# Patient Record
Sex: Male | Born: 1965 | Race: White | Hispanic: No | Smoking: Current every day smoker
Health system: Southern US, Community
[De-identification: ages and names within clinical notes are randomized; demographics above are authoritative.]

## PROBLEM LIST (undated history)

## (undated) DIAGNOSIS — T782XXA Anaphylactic shock, unspecified, initial encounter: Secondary | ICD-10-CM

## (undated) DIAGNOSIS — Z8673 Personal history of transient ischemic attack (TIA), and cerebral infarction without residual deficits: Secondary | ICD-10-CM

## (undated) DIAGNOSIS — G4733 Obstructive sleep apnea (adult) (pediatric): Secondary | ICD-10-CM

## (undated) DIAGNOSIS — F32A Depression, unspecified: Secondary | ICD-10-CM

## (undated) DIAGNOSIS — E782 Mixed hyperlipidemia: Secondary | ICD-10-CM

## (undated) DIAGNOSIS — F329 Major depressive disorder, single episode, unspecified: Secondary | ICD-10-CM

## (undated) DIAGNOSIS — I2510 Atherosclerotic heart disease of native coronary artery without angina pectoris: Secondary | ICD-10-CM

## (undated) DIAGNOSIS — C801 Malignant (primary) neoplasm, unspecified: Secondary | ICD-10-CM

## (undated) DIAGNOSIS — I429 Cardiomyopathy, unspecified: Secondary | ICD-10-CM

## (undated) DIAGNOSIS — E119 Type 2 diabetes mellitus without complications: Secondary | ICD-10-CM

## (undated) DIAGNOSIS — M1990 Unspecified osteoarthritis, unspecified site: Secondary | ICD-10-CM

## (undated) DIAGNOSIS — J189 Pneumonia, unspecified organism: Secondary | ICD-10-CM

## (undated) DIAGNOSIS — I509 Heart failure, unspecified: Secondary | ICD-10-CM

## (undated) DIAGNOSIS — I219 Acute myocardial infarction, unspecified: Secondary | ICD-10-CM

## (undated) DIAGNOSIS — I10 Essential (primary) hypertension: Secondary | ICD-10-CM

## (undated) DIAGNOSIS — Z87442 Personal history of urinary calculi: Secondary | ICD-10-CM

## (undated) DIAGNOSIS — F419 Anxiety disorder, unspecified: Secondary | ICD-10-CM

## (undated) DIAGNOSIS — I639 Cerebral infarction, unspecified: Secondary | ICD-10-CM

## (undated) DIAGNOSIS — J449 Chronic obstructive pulmonary disease, unspecified: Secondary | ICD-10-CM

## (undated) HISTORY — DX: Atherosclerotic heart disease of native coronary artery without angina pectoris: I25.10

## (undated) HISTORY — DX: Depression, unspecified: F32.A

## (undated) HISTORY — DX: Type 2 diabetes mellitus without complications: E11.9

## (undated) HISTORY — DX: Mixed hyperlipidemia: E78.2

## (undated) HISTORY — DX: Obstructive sleep apnea (adult) (pediatric): G47.33

## (undated) HISTORY — DX: Essential (primary) hypertension: I10

## (undated) HISTORY — DX: Major depressive disorder, single episode, unspecified: F32.9

## (undated) HISTORY — PX: CHOLECYSTECTOMY: SHX55

## (undated) HISTORY — DX: Cardiomyopathy, unspecified: I42.9

## (undated) HISTORY — DX: Chronic obstructive pulmonary disease, unspecified: J44.9

## (undated) HISTORY — PX: HERNIA REPAIR: SHX51

## (undated) HISTORY — DX: Anaphylactic shock, unspecified, initial encounter: T78.2XXA

## (undated) HISTORY — PX: TONSILLECTOMY: SUR1361

## (undated) HISTORY — PX: TONSILECTOMY, ADENOIDECTOMY, BILATERAL MYRINGOTOMY AND TUBES: SHX2538

---

## 1996-07-15 HISTORY — PX: TOE AMPUTATION: SHX809

## 1998-07-26 ENCOUNTER — Encounter: Payer: Self-pay | Admitting: *Deleted

## 1998-07-26 ENCOUNTER — Inpatient Hospital Stay (HOSPITAL_COMMUNITY): Admission: AD | Admit: 1998-07-26 | Discharge: 1998-07-27 | Payer: Self-pay | Admitting: *Deleted

## 1998-07-27 ENCOUNTER — Encounter: Payer: Self-pay | Admitting: *Deleted

## 1999-09-24 ENCOUNTER — Inpatient Hospital Stay (HOSPITAL_COMMUNITY): Admission: EM | Admit: 1999-09-24 | Discharge: 1999-09-27 | Payer: Self-pay | Admitting: Emergency Medicine

## 1999-09-24 ENCOUNTER — Encounter: Payer: Self-pay | Admitting: Emergency Medicine

## 1999-09-25 ENCOUNTER — Encounter: Payer: Self-pay | Admitting: *Deleted

## 2000-04-24 ENCOUNTER — Encounter: Payer: Self-pay | Admitting: Emergency Medicine

## 2000-04-24 ENCOUNTER — Emergency Department (HOSPITAL_COMMUNITY): Admission: EM | Admit: 2000-04-24 | Discharge: 2000-04-24 | Payer: Self-pay | Admitting: Emergency Medicine

## 2000-10-21 ENCOUNTER — Encounter: Payer: Self-pay | Admitting: Emergency Medicine

## 2000-10-22 ENCOUNTER — Observation Stay (HOSPITAL_COMMUNITY): Admission: EM | Admit: 2000-10-22 | Discharge: 2000-10-22 | Payer: Self-pay

## 2001-05-30 ENCOUNTER — Encounter: Payer: Self-pay | Admitting: *Deleted

## 2001-05-30 ENCOUNTER — Emergency Department (HOSPITAL_COMMUNITY): Admission: EM | Admit: 2001-05-30 | Discharge: 2001-05-30 | Payer: Self-pay | Admitting: *Deleted

## 2001-06-19 ENCOUNTER — Encounter: Payer: Self-pay | Admitting: Emergency Medicine

## 2001-06-19 ENCOUNTER — Emergency Department (HOSPITAL_COMMUNITY): Admission: EM | Admit: 2001-06-19 | Discharge: 2001-06-19 | Payer: Self-pay | Admitting: Emergency Medicine

## 2001-07-15 HISTORY — PX: DENTAL SURGERY: SHX609

## 2001-11-11 ENCOUNTER — Emergency Department (HOSPITAL_COMMUNITY): Admission: EM | Admit: 2001-11-11 | Discharge: 2001-11-11 | Payer: Self-pay | Admitting: *Deleted

## 2001-11-11 ENCOUNTER — Encounter: Payer: Self-pay | Admitting: *Deleted

## 2001-11-13 ENCOUNTER — Ambulatory Visit (HOSPITAL_COMMUNITY): Admission: RE | Admit: 2001-11-13 | Discharge: 2001-11-14 | Payer: Self-pay | Admitting: Cardiology

## 2001-12-04 ENCOUNTER — Emergency Department (HOSPITAL_COMMUNITY): Admission: EM | Admit: 2001-12-04 | Discharge: 2001-12-04 | Payer: Self-pay | Admitting: Emergency Medicine

## 2001-12-30 ENCOUNTER — Encounter: Payer: Self-pay | Admitting: Emergency Medicine

## 2001-12-30 ENCOUNTER — Observation Stay (HOSPITAL_COMMUNITY): Admission: EM | Admit: 2001-12-30 | Discharge: 2001-12-31 | Payer: Self-pay | Admitting: Emergency Medicine

## 2002-02-01 ENCOUNTER — Encounter: Payer: Self-pay | Admitting: *Deleted

## 2002-02-01 ENCOUNTER — Emergency Department (HOSPITAL_COMMUNITY): Admission: EM | Admit: 2002-02-01 | Discharge: 2002-02-02 | Payer: Self-pay | Admitting: *Deleted

## 2002-03-01 ENCOUNTER — Emergency Department (HOSPITAL_COMMUNITY): Admission: EM | Admit: 2002-03-01 | Discharge: 2002-03-02 | Payer: Self-pay | Admitting: *Deleted

## 2002-03-02 ENCOUNTER — Encounter: Payer: Self-pay | Admitting: *Deleted

## 2002-04-22 ENCOUNTER — Inpatient Hospital Stay (HOSPITAL_COMMUNITY): Admission: EM | Admit: 2002-04-22 | Discharge: 2002-04-24 | Payer: Self-pay | Admitting: Cardiology

## 2002-06-30 ENCOUNTER — Encounter: Payer: Self-pay | Admitting: Dentistry

## 2002-07-06 ENCOUNTER — Ambulatory Visit (HOSPITAL_COMMUNITY): Admission: RE | Admit: 2002-07-06 | Discharge: 2002-07-06 | Payer: Self-pay | Admitting: Oral Surgery

## 2002-10-10 ENCOUNTER — Inpatient Hospital Stay (HOSPITAL_COMMUNITY): Admission: AD | Admit: 2002-10-10 | Discharge: 2002-10-12 | Payer: Self-pay | Admitting: Cardiology

## 2002-12-07 ENCOUNTER — Emergency Department (HOSPITAL_COMMUNITY): Admission: EM | Admit: 2002-12-07 | Discharge: 2002-12-07 | Payer: Self-pay | Admitting: Emergency Medicine

## 2004-02-10 ENCOUNTER — Observation Stay (HOSPITAL_COMMUNITY): Admission: EM | Admit: 2004-02-10 | Discharge: 2004-02-10 | Payer: Self-pay | Admitting: Cardiovascular Disease

## 2004-04-08 ENCOUNTER — Emergency Department (HOSPITAL_COMMUNITY): Admission: EM | Admit: 2004-04-08 | Discharge: 2004-04-09 | Payer: Self-pay | Admitting: Emergency Medicine

## 2004-08-01 ENCOUNTER — Ambulatory Visit: Payer: Self-pay | Admitting: Cardiology

## 2005-02-08 ENCOUNTER — Ambulatory Visit: Payer: Self-pay | Admitting: Cardiology

## 2005-08-14 ENCOUNTER — Ambulatory Visit: Payer: Self-pay | Admitting: Cardiology

## 2005-08-19 ENCOUNTER — Inpatient Hospital Stay (HOSPITAL_BASED_OUTPATIENT_CLINIC_OR_DEPARTMENT_OTHER): Admission: RE | Admit: 2005-08-19 | Discharge: 2005-08-19 | Payer: Self-pay | Admitting: Cardiology

## 2005-08-19 ENCOUNTER — Ambulatory Visit: Payer: Self-pay | Admitting: Cardiology

## 2006-09-05 ENCOUNTER — Encounter: Payer: Self-pay | Admitting: Cardiology

## 2006-09-05 ENCOUNTER — Ambulatory Visit: Payer: Self-pay | Admitting: Cardiovascular Disease

## 2006-09-05 ENCOUNTER — Inpatient Hospital Stay (HOSPITAL_COMMUNITY): Admission: AD | Admit: 2006-09-05 | Discharge: 2006-09-07 | Payer: Self-pay | Admitting: Cardiovascular Disease

## 2006-09-05 ENCOUNTER — Encounter: Payer: Self-pay | Admitting: Internal Medicine

## 2006-09-19 ENCOUNTER — Inpatient Hospital Stay (HOSPITAL_COMMUNITY): Admission: EM | Admit: 2006-09-19 | Discharge: 2006-09-22 | Payer: Self-pay | Admitting: Cardiology

## 2006-09-19 ENCOUNTER — Ambulatory Visit: Payer: Self-pay | Admitting: *Deleted

## 2006-10-02 ENCOUNTER — Ambulatory Visit: Payer: Self-pay | Admitting: Cardiology

## 2006-10-15 ENCOUNTER — Ambulatory Visit: Payer: Self-pay | Admitting: Cardiology

## 2006-11-19 ENCOUNTER — Ambulatory Visit: Payer: Self-pay | Admitting: Cardiology

## 2006-11-24 ENCOUNTER — Ambulatory Visit: Payer: Self-pay | Admitting: Cardiovascular Disease

## 2006-11-24 ENCOUNTER — Ambulatory Visit (HOSPITAL_COMMUNITY): Admission: RE | Admit: 2006-11-24 | Discharge: 2006-11-24 | Payer: Self-pay | Admitting: Cardiovascular Disease

## 2007-02-11 ENCOUNTER — Ambulatory Visit (HOSPITAL_COMMUNITY): Admission: RE | Admit: 2007-02-11 | Discharge: 2007-02-11 | Payer: Self-pay | Admitting: Urology

## 2007-02-18 ENCOUNTER — Ambulatory Visit: Payer: Self-pay | Admitting: Physician Assistant

## 2007-02-19 ENCOUNTER — Ambulatory Visit (HOSPITAL_COMMUNITY): Admission: RE | Admit: 2007-02-19 | Discharge: 2007-02-19 | Payer: Self-pay | Admitting: Urology

## 2007-03-30 ENCOUNTER — Ambulatory Visit (HOSPITAL_COMMUNITY): Admission: RE | Admit: 2007-03-30 | Discharge: 2007-03-30 | Payer: Self-pay | Admitting: Orthopedic Surgery

## 2007-04-21 ENCOUNTER — Ambulatory Visit: Payer: Self-pay | Admitting: Cardiology

## 2007-06-24 ENCOUNTER — Ambulatory Visit: Payer: Self-pay | Admitting: Cardiology

## 2007-11-10 ENCOUNTER — Encounter: Payer: Self-pay | Admitting: Cardiology

## 2007-11-10 ENCOUNTER — Ambulatory Visit: Payer: Self-pay | Admitting: Cardiology

## 2007-11-11 ENCOUNTER — Encounter: Payer: Self-pay | Admitting: Cardiology

## 2007-12-03 ENCOUNTER — Ambulatory Visit: Payer: Self-pay | Admitting: Cardiology

## 2008-03-08 ENCOUNTER — Ambulatory Visit: Payer: Self-pay | Admitting: Cardiology

## 2008-05-27 ENCOUNTER — Encounter: Payer: Self-pay | Admitting: Cardiology

## 2008-06-20 ENCOUNTER — Ambulatory Visit: Payer: Self-pay | Admitting: Cardiology

## 2008-06-24 ENCOUNTER — Ambulatory Visit: Payer: Self-pay | Admitting: Cardiovascular Disease

## 2008-07-15 DIAGNOSIS — I219 Acute myocardial infarction, unspecified: Secondary | ICD-10-CM

## 2008-07-15 HISTORY — PX: GASTRIC BYPASS: SHX52

## 2008-07-15 HISTORY — PX: CORONARY ARTERY BYPASS GRAFT: SHX141

## 2008-07-15 HISTORY — DX: Acute myocardial infarction, unspecified: I21.9

## 2008-07-20 ENCOUNTER — Encounter: Payer: Self-pay | Admitting: Cardiology

## 2008-07-21 ENCOUNTER — Encounter: Payer: Self-pay | Admitting: Cardiovascular Disease

## 2008-07-21 ENCOUNTER — Ambulatory Visit: Payer: Self-pay | Admitting: Vascular Surgery

## 2008-07-21 ENCOUNTER — Inpatient Hospital Stay (HOSPITAL_COMMUNITY): Admission: RE | Admit: 2008-07-21 | Discharge: 2008-07-21 | Payer: Self-pay | Admitting: Cardiovascular Disease

## 2008-07-21 ENCOUNTER — Ambulatory Visit: Payer: Self-pay | Admitting: Cardiovascular Disease

## 2008-07-26 ENCOUNTER — Ambulatory Visit: Payer: Self-pay | Admitting: Surgery

## 2008-07-29 ENCOUNTER — Ambulatory Visit: Payer: Self-pay | Admitting: Surgery

## 2008-07-29 ENCOUNTER — Inpatient Hospital Stay (HOSPITAL_COMMUNITY): Admission: RE | Admit: 2008-07-29 | Discharge: 2008-08-04 | Payer: Self-pay | Admitting: Surgery

## 2008-08-02 ENCOUNTER — Encounter: Payer: Self-pay | Admitting: Cardiology

## 2008-08-04 ENCOUNTER — Encounter: Payer: Self-pay | Admitting: Cardiology

## 2008-08-16 DIAGNOSIS — E782 Mixed hyperlipidemia: Secondary | ICD-10-CM | POA: Insufficient documentation

## 2008-08-16 DIAGNOSIS — I2510 Atherosclerotic heart disease of native coronary artery without angina pectoris: Secondary | ICD-10-CM | POA: Insufficient documentation

## 2008-08-16 DIAGNOSIS — E785 Hyperlipidemia, unspecified: Secondary | ICD-10-CM | POA: Insufficient documentation

## 2008-08-16 DIAGNOSIS — I10 Essential (primary) hypertension: Secondary | ICD-10-CM | POA: Insufficient documentation

## 2008-08-17 ENCOUNTER — Ambulatory Visit: Payer: Self-pay | Admitting: Cardiology

## 2008-08-29 ENCOUNTER — Encounter: Payer: Self-pay | Admitting: Cardiology

## 2008-08-29 ENCOUNTER — Ambulatory Visit: Payer: Self-pay | Admitting: Surgery

## 2008-08-29 ENCOUNTER — Encounter: Admission: RE | Admit: 2008-08-29 | Discharge: 2008-08-29 | Payer: Self-pay | Admitting: Surgery

## 2008-09-22 ENCOUNTER — Ambulatory Visit: Payer: Self-pay | Admitting: Cardiology

## 2009-03-24 ENCOUNTER — Encounter: Payer: Self-pay | Admitting: Cardiology

## 2009-08-03 ENCOUNTER — Encounter: Payer: Self-pay | Admitting: Cardiology

## 2009-08-18 ENCOUNTER — Encounter: Payer: Self-pay | Admitting: Cardiology

## 2009-08-21 ENCOUNTER — Telehealth (INDEPENDENT_AMBULATORY_CARE_PROVIDER_SITE_OTHER): Payer: Self-pay | Admitting: *Deleted

## 2009-08-23 ENCOUNTER — Encounter: Payer: Self-pay | Admitting: Physician Assistant

## 2009-08-23 ENCOUNTER — Ambulatory Visit: Payer: Self-pay | Admitting: Cardiology

## 2009-08-23 DIAGNOSIS — F17200 Nicotine dependence, unspecified, uncomplicated: Secondary | ICD-10-CM | POA: Insufficient documentation

## 2009-09-15 ENCOUNTER — Emergency Department (HOSPITAL_COMMUNITY): Admission: EM | Admit: 2009-09-15 | Discharge: 2009-09-15 | Payer: Self-pay | Admitting: Emergency Medicine

## 2009-10-11 ENCOUNTER — Encounter: Payer: Self-pay | Admitting: Cardiology

## 2009-10-11 ENCOUNTER — Emergency Department (HOSPITAL_COMMUNITY): Admission: EM | Admit: 2009-10-11 | Discharge: 2009-10-11 | Payer: Self-pay | Admitting: Emergency Medicine

## 2009-11-12 IMAGING — CR DG CHEST 1V PORT
1 series · 1 of 1 positions shown · non-contrast
Comparison: Chest radiograph 08/01/2008

CLINICAL DATA: Coronary artery disease

PORTABLE CHEST - 1 VIEW

[view not recorded]
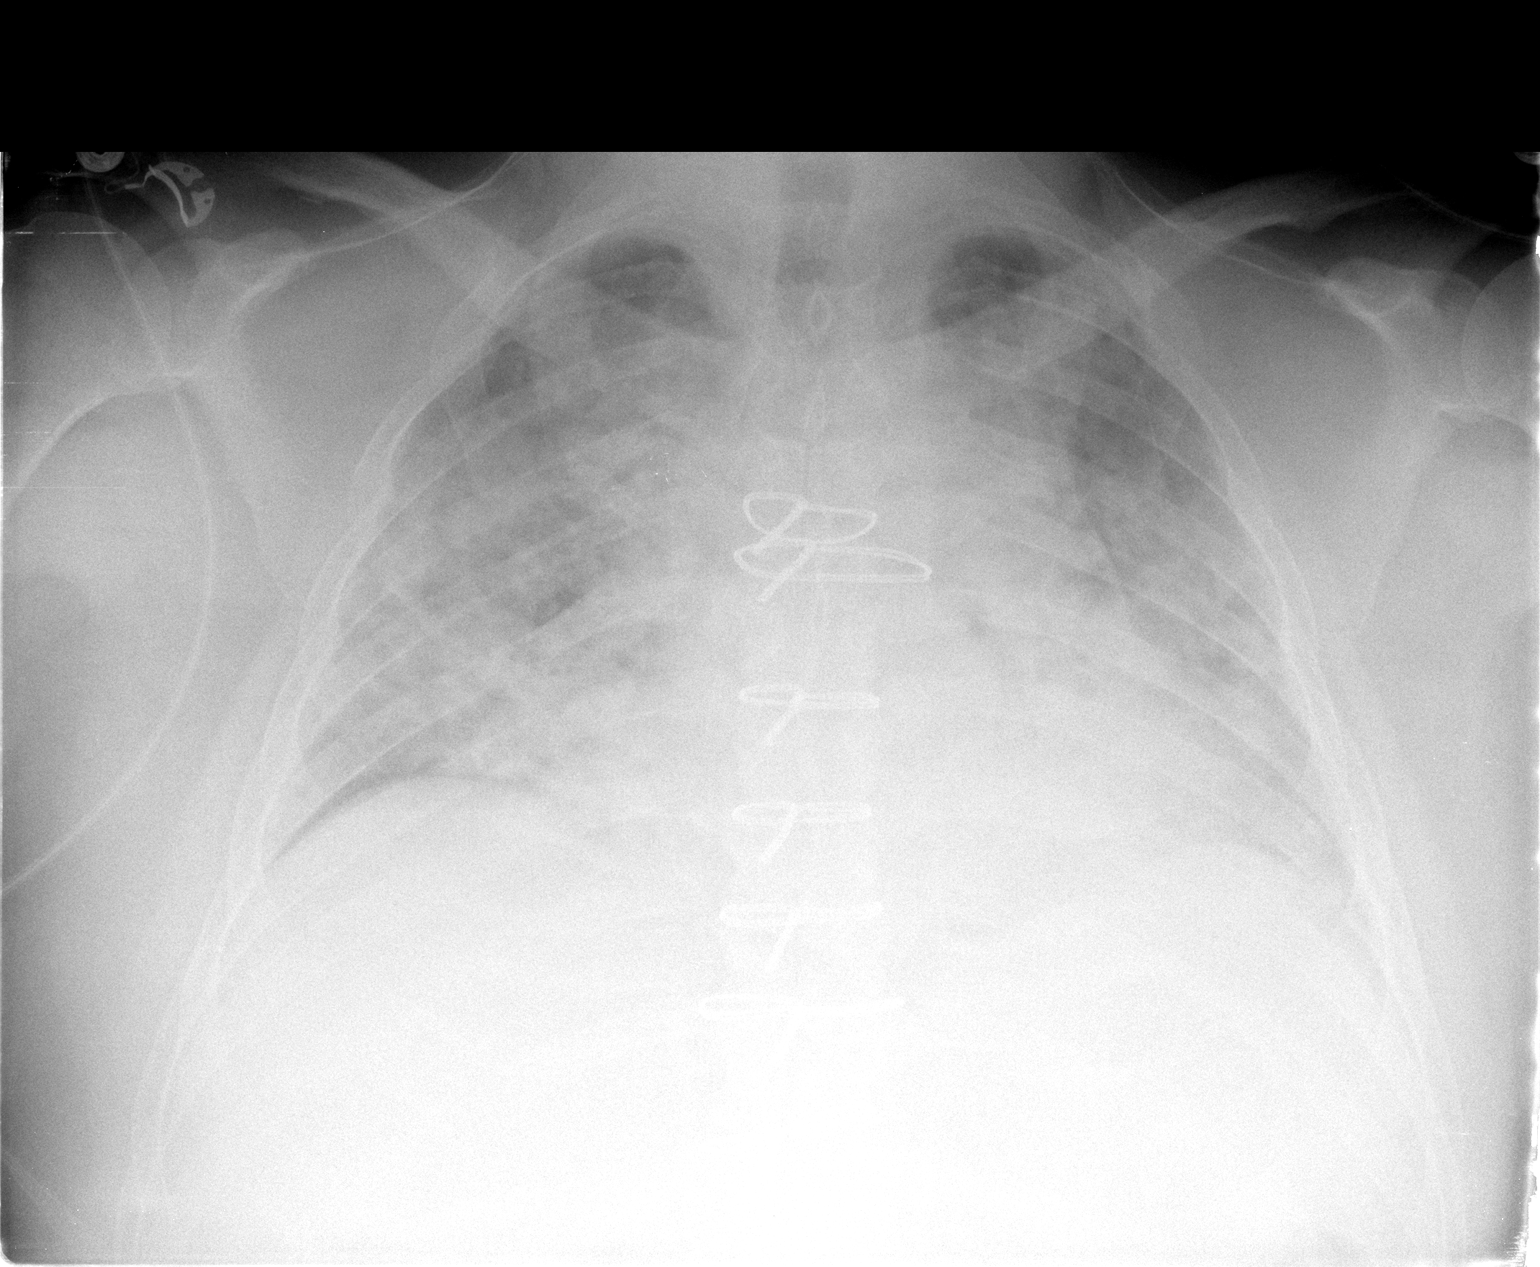

[1 of 1 positions shown; findings below may reference images not displayed]

FINDINGS: Stable enlarged cardiac silhouette.  There are low lung
volumes.  There is bilateral air space disease which is increased
from prior.  No pneumothorax.
IMPRESSION: Cardiomegaly and increased pulmonary edema consistent congestive
heart failure.

## 2009-11-13 IMAGING — CR DG CHEST 1V PORT
1 series · 1 of 1 positions shown · non-contrast
Comparison: 08/01/2008 study

CLINICAL DATA: History of coronary artery disease.

PORTABLE CHEST - 1 VIEW

[view not recorded]
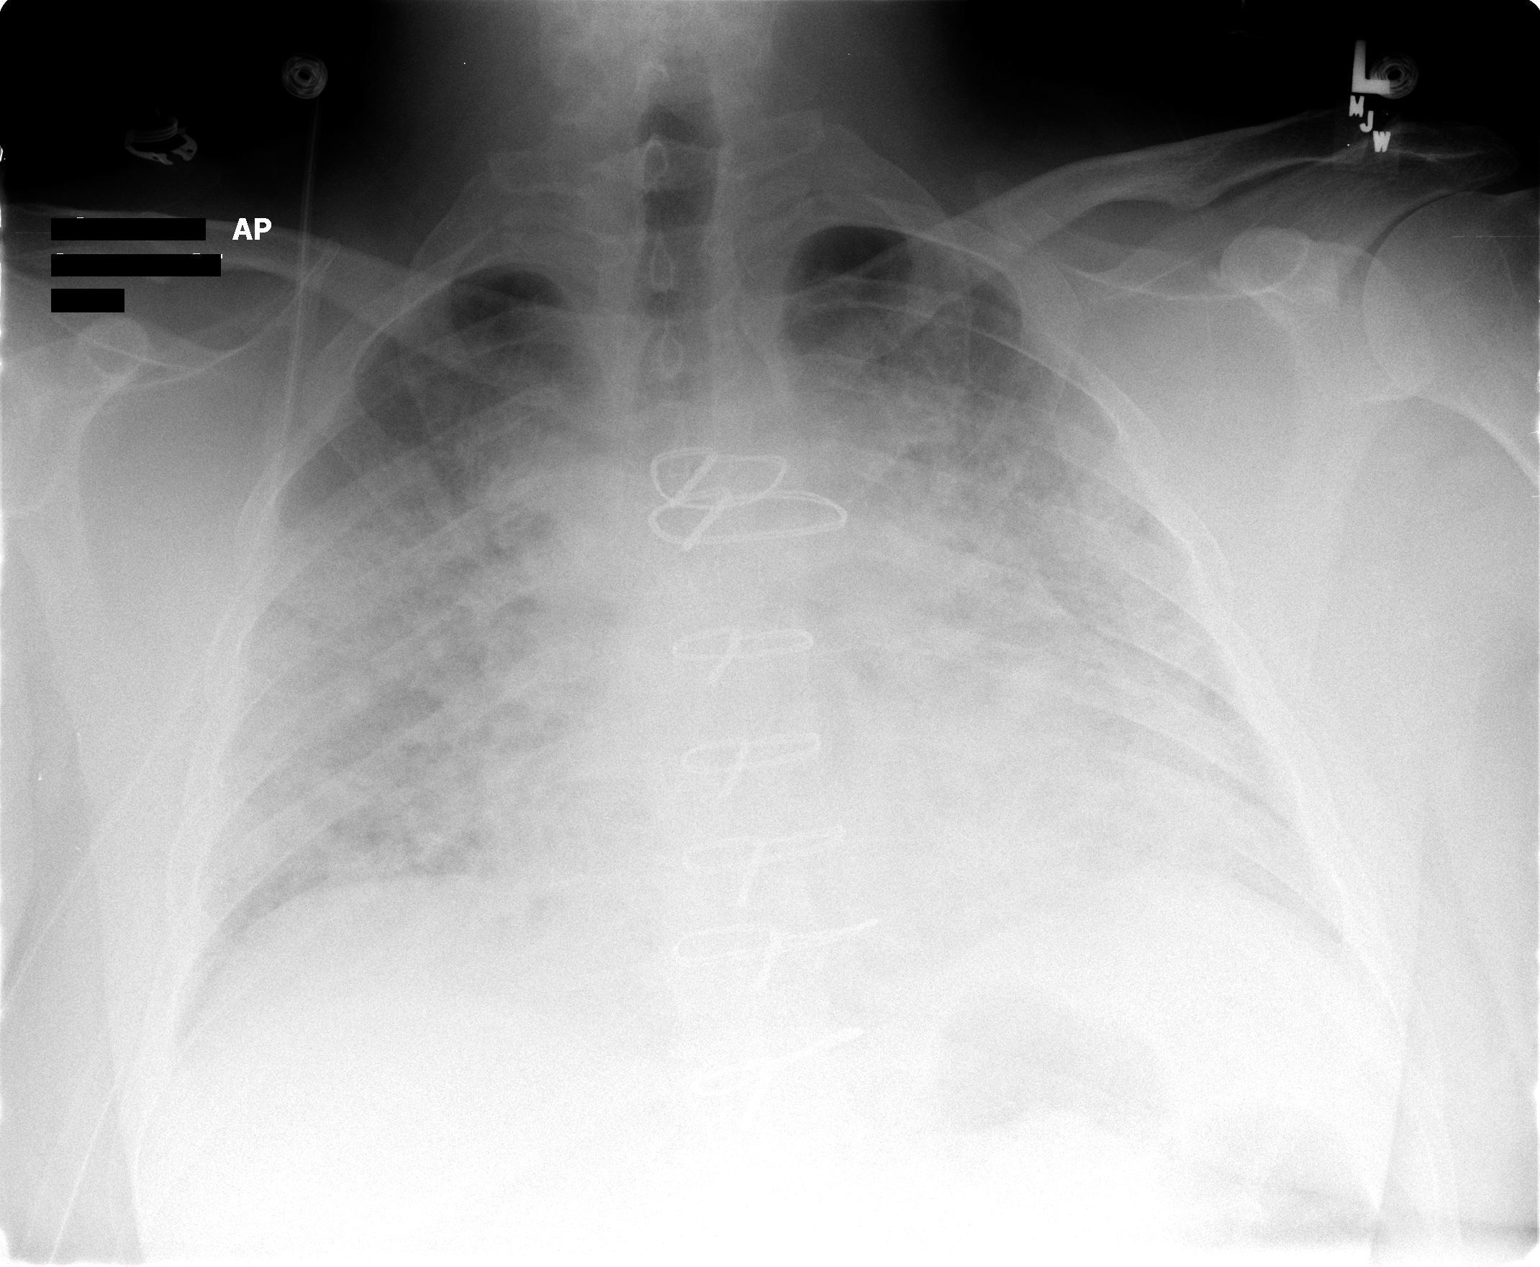

[1 of 1 positions shown; findings below may reference images not displayed]

FINDINGS: There is stable enlargement of the cardiac silhouette.
There is a pulmonary venous hypertension pulmonary vascular
congestion pattern with diffuse bilateral airspace opacities
present without significant change from previous study.  No
definite pleural effusion is identified.  No pneumothorax is
evident.
IMPRESSION: Enlargement cardiac silhouette appears stable There is a pulmonary
venous hypertension pulmonary vascular congestion pattern with
diffuse bilateral airspace opacities consist with pulmonary edema
present without significant change from previous study.

## 2009-11-14 IMAGING — CR DG CHEST 1V PORT
1 series · 1 of 1 positions shown · non-contrast
Comparison: 08/02/2008

CLINICAL DATA: Coronary artery bypass graft

PORTABLE CHEST - 1 VIEW

[view not recorded]
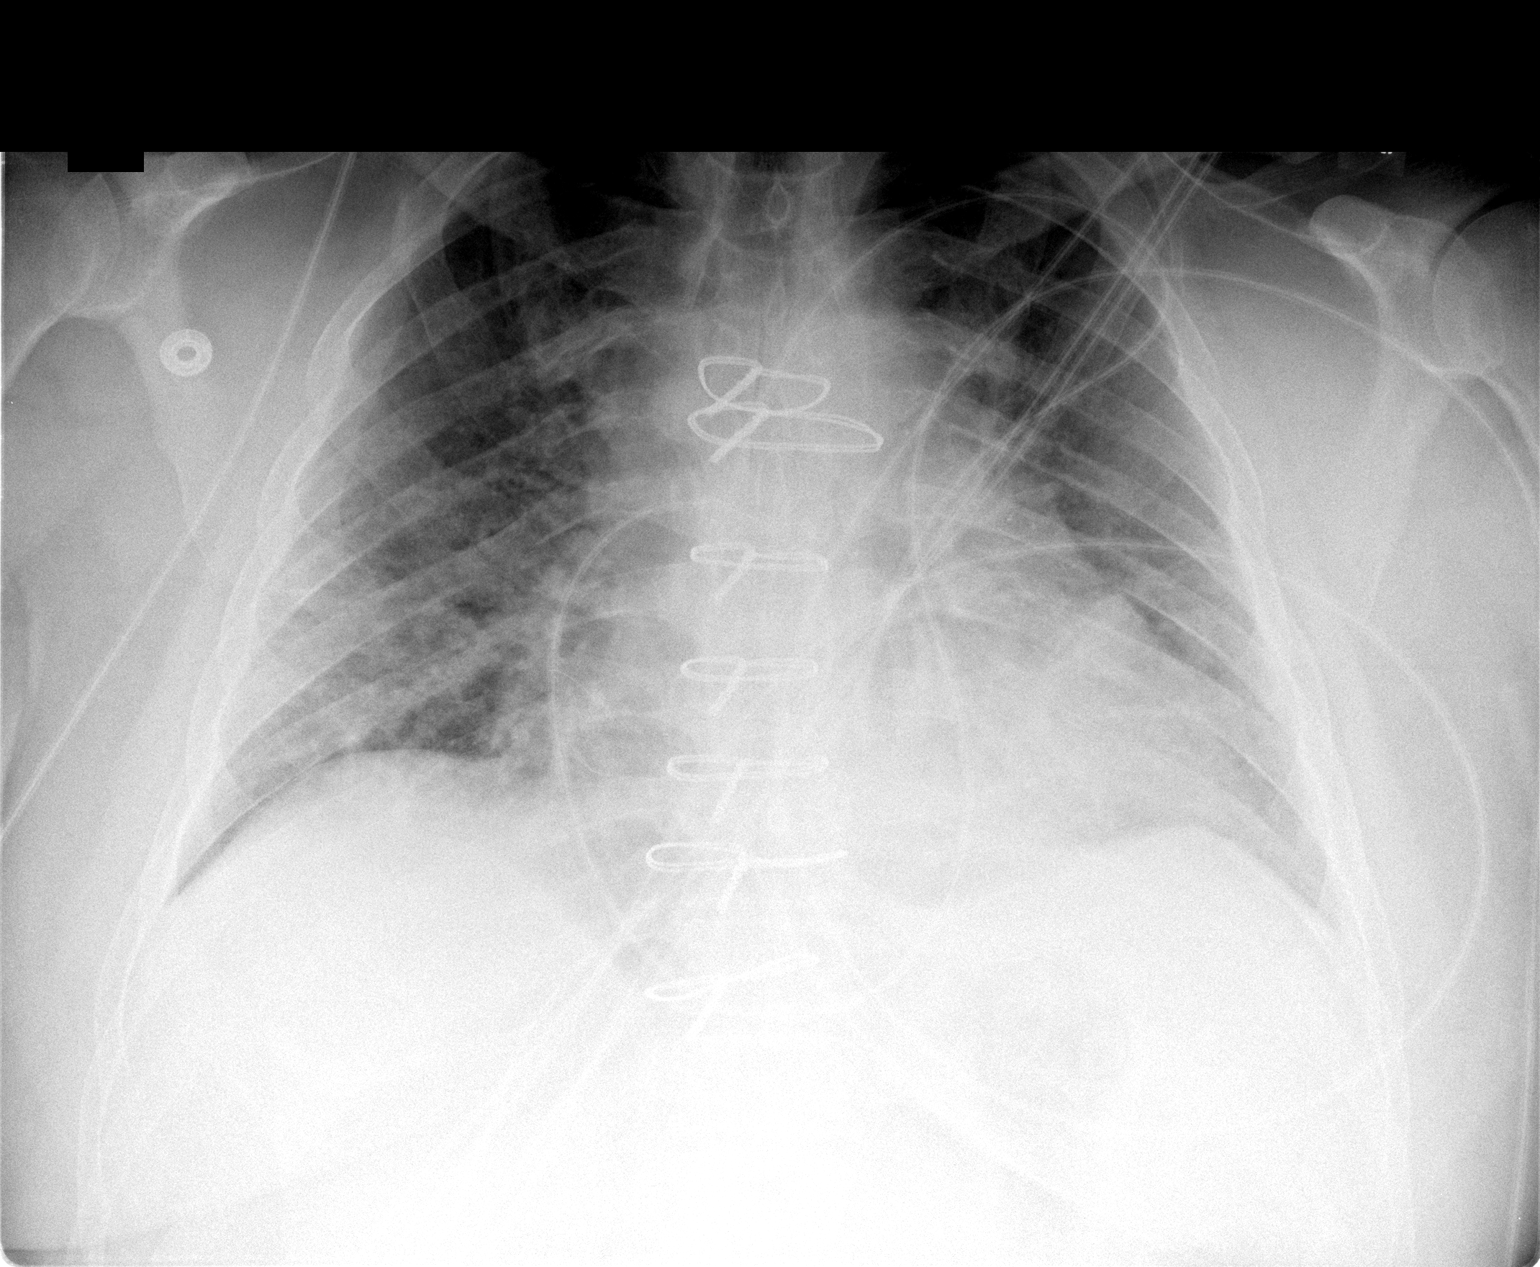

[1 of 1 positions shown; findings below may reference images not displayed]

FINDINGS: Diffuse edema has improved.  Cardiomegaly persist.
Prominent superior mediastinum is improved.  No pneumothorax.
IMPRESSION: Improved edema and mediastinum.

## 2009-11-15 IMAGING — CR DG CHEST 2V
2 series · 2 of 2 positions shown · non-contrast
Comparison: 08/03/2008

CLINICAL DATA: Bypass surgery.

CHEST - 2 VIEW

[w chest pa]
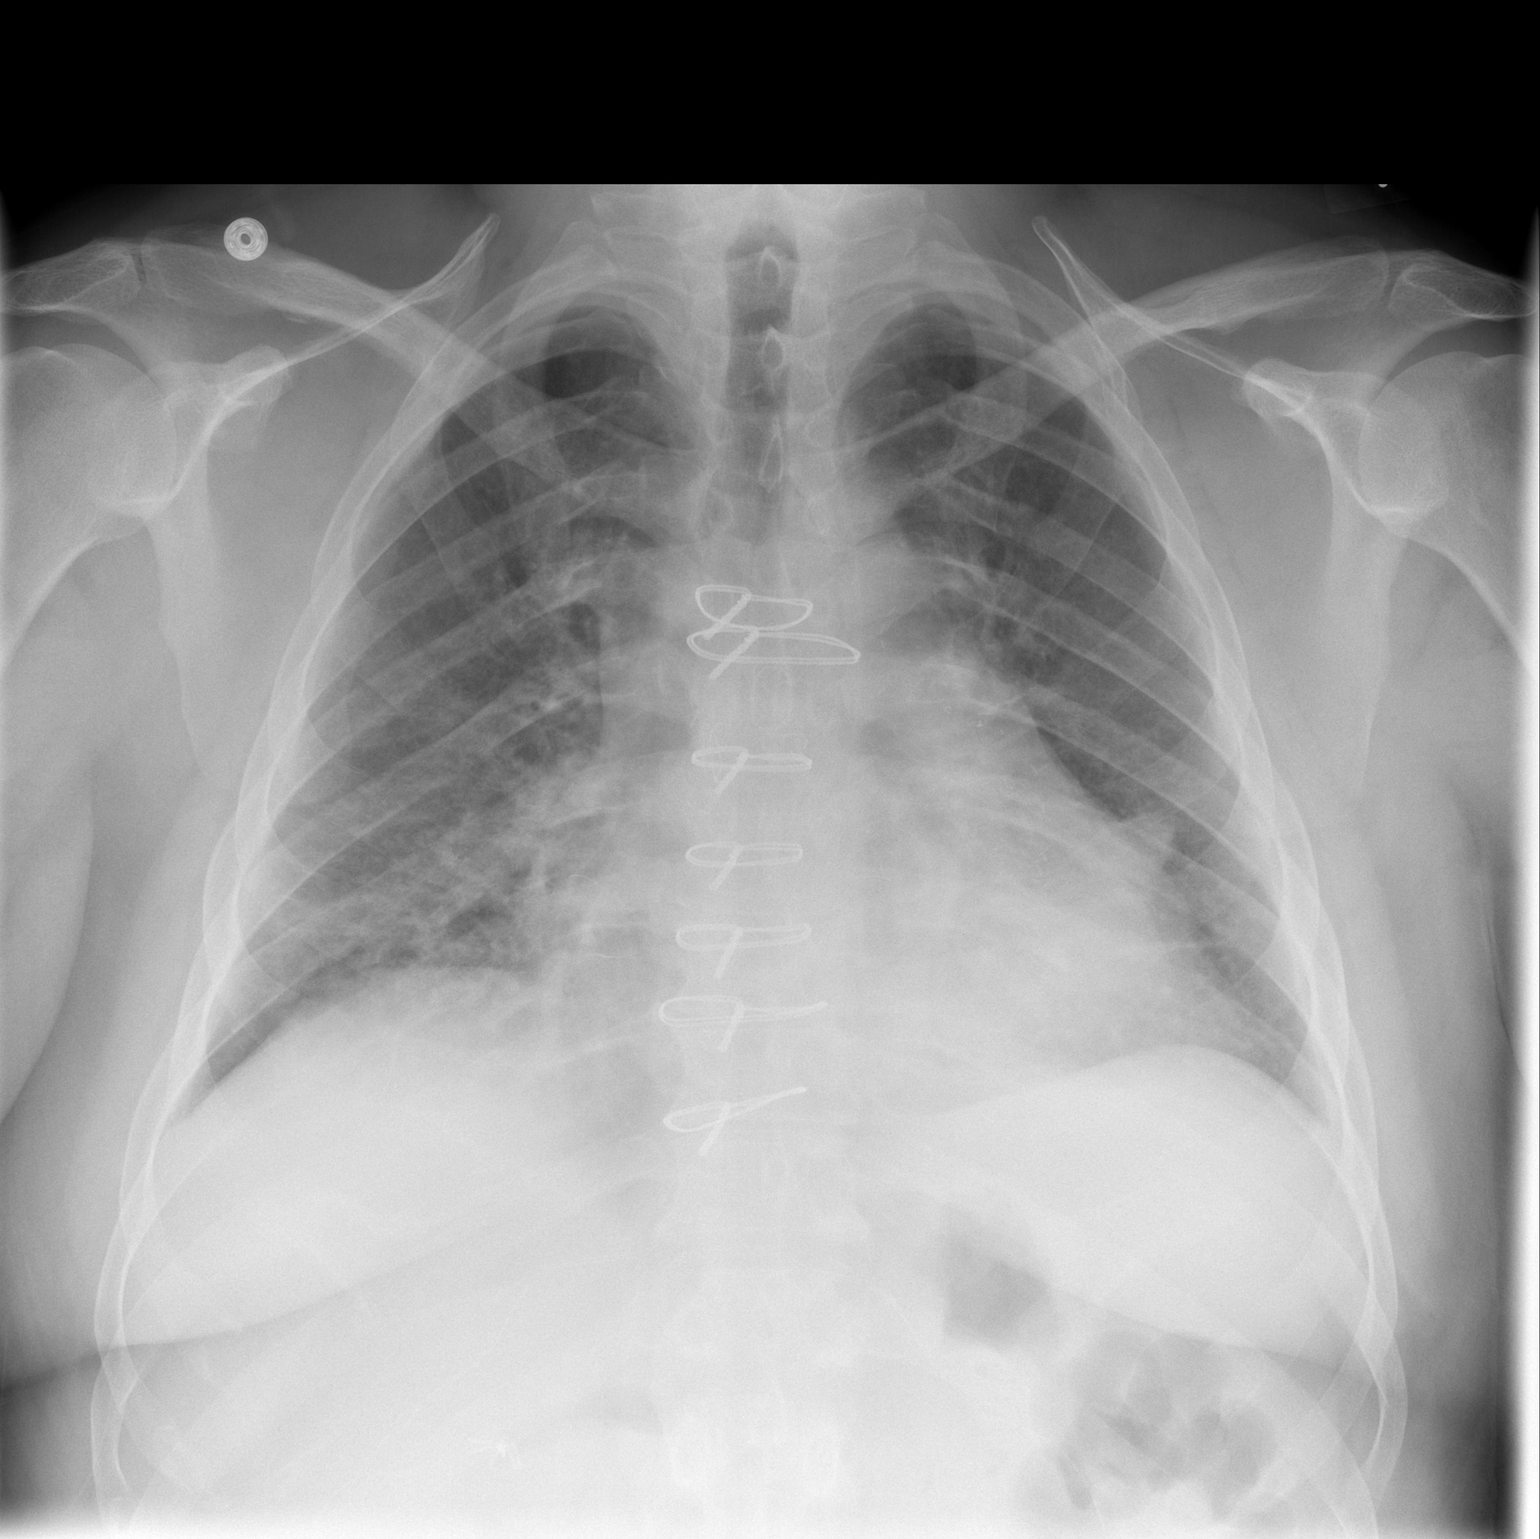

[w chest lat]
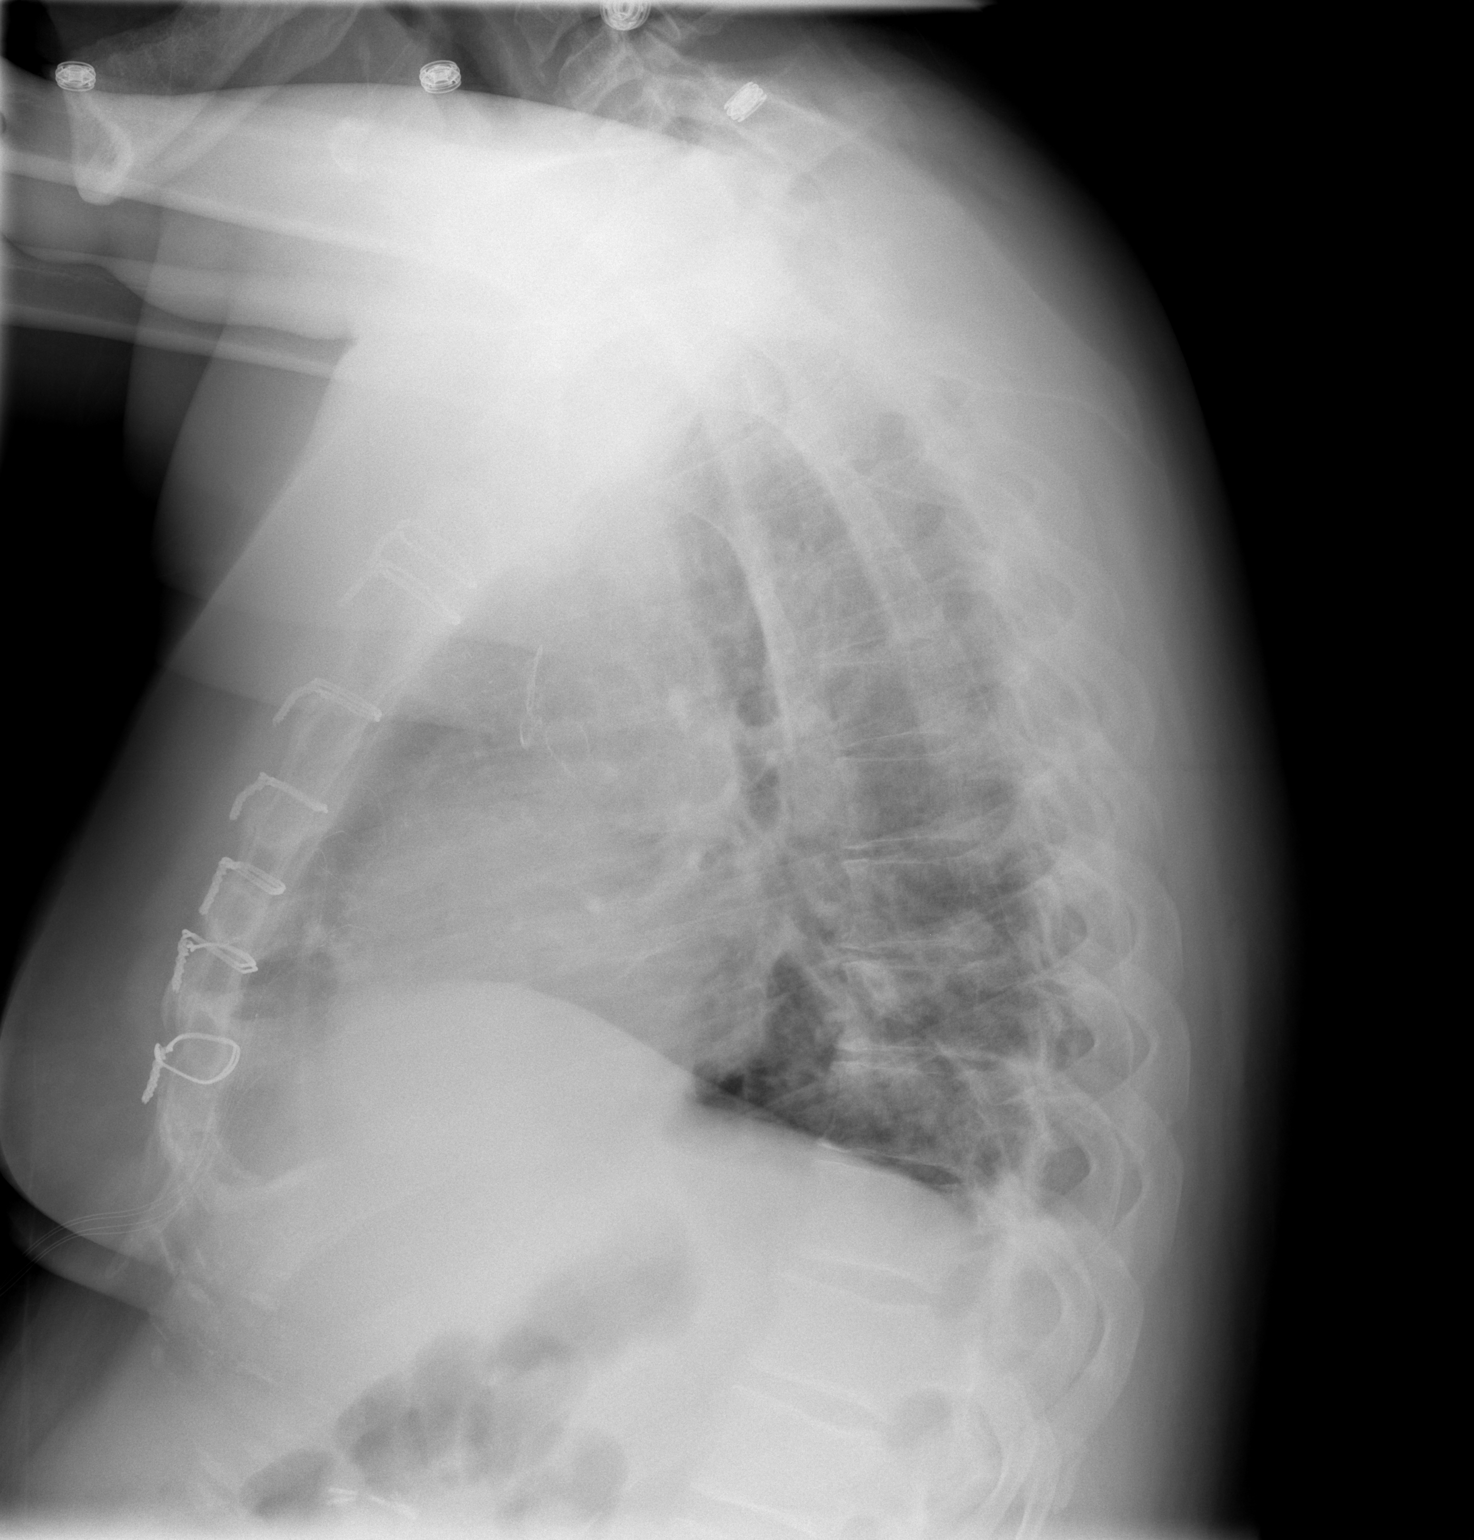

[2 of 2 positions shown; findings below may reference images not displayed]

FINDINGS: Stable surgical changes.  Interval improved lung aeration
with resolving edema and atelectasis.  No pleural effusions.  No
pneumothorax.
IMPRESSION: Improved lung aeration with resolving edema and atelectasis.

## 2010-01-30 ENCOUNTER — Encounter: Payer: Self-pay | Admitting: Cardiology

## 2010-02-13 ENCOUNTER — Encounter: Payer: Self-pay | Admitting: Cardiology

## 2010-06-22 ENCOUNTER — Encounter: Payer: Self-pay | Admitting: Cardiology

## 2010-07-06 ENCOUNTER — Encounter
Admission: RE | Admit: 2010-07-06 | Discharge: 2010-07-06 | Payer: Self-pay | Source: Home / Self Care | Attending: Diagnostic Neuroimaging | Admitting: Diagnostic Neuroimaging

## 2010-07-13 ENCOUNTER — Encounter: Payer: Self-pay | Admitting: Cardiology

## 2010-07-18 ENCOUNTER — Encounter: Payer: Self-pay | Admitting: Cardiology

## 2010-07-20 ENCOUNTER — Encounter: Payer: Self-pay | Admitting: Cardiology

## 2010-08-01 ENCOUNTER — Encounter: Payer: Self-pay | Admitting: Cardiology

## 2010-08-05 ENCOUNTER — Encounter: Payer: Self-pay | Admitting: Urology

## 2010-08-10 ENCOUNTER — Encounter: Payer: Self-pay | Admitting: Cardiology

## 2010-08-13 ENCOUNTER — Encounter: Payer: Self-pay | Admitting: Cardiology

## 2010-08-14 NOTE — Letter (Signed)
Summary: External Correspondence/ OFFICE VISIT DR. DANIEL  External Correspondence/ OFFICE VISIT DR. DANIEL   Imported By: Bartholomew Boards 02/20/2010 15:24:57  _____________________________________________________________________  External Attachment:    Type:   Image     Comment:   External Document
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
## 2010-08-16 NOTE — Letter (Signed)
Summary: External Correspondence/ OFFICE NOTE DR. DANIEL  External Correspondence/ OFFICE NOTE DR. DANIEL   Imported By: Bartholomew Boards 06/26/2010 15:33:29  _____________________________________________________________________  External Attachment:    Type:   Image     Comment:   External Document

## 2010-08-22 NOTE — Letter (Addendum)
Summary: Clearance Request [place]   Imported By: Gurney Maxin, RN, BSN 08/14/2010 08:20:27  _____________________________________________________________________  External Attachment:    Type:   Image     Comment:   External Document  Appended Document: Clearance Request [place] Reviewed. I have not seen him since 2/11. Need to schedule followup visit and can then determine medical treatment options and ability to hold Plavix.  Appended Document: Clearance Request [place] with pt's wife. She will have pt call to schedule appt.  Appended Document: Clearance Request [place] scheduled for 08/24/10 at [place] per Dr. Domenic Polite to [facility] this appt.  Appended Document: Clearance Request [place] Call from [place] left on my voicemail asking if pt could hold Plavix. Left message to notify [facility] that this will be decided at office visit on 2/10 with Dr. Domenic Polite.

## 2010-08-24 ENCOUNTER — Ambulatory Visit (INDEPENDENT_AMBULATORY_CARE_PROVIDER_SITE_OTHER): Payer: Medicare Other | Admitting: Cardiology

## 2010-08-24 ENCOUNTER — Encounter: Payer: Self-pay | Admitting: Cardiology

## 2010-08-24 DIAGNOSIS — I10 Essential (primary) hypertension: Secondary | ICD-10-CM

## 2010-08-24 DIAGNOSIS — I2510 Atherosclerotic heart disease of native coronary artery without angina pectoris: Secondary | ICD-10-CM

## 2010-08-24 DIAGNOSIS — Z01810 Encounter for preprocedural cardiovascular examination: Secondary | ICD-10-CM

## 2010-08-24 DIAGNOSIS — F17200 Nicotine dependence, unspecified, uncomplicated: Secondary | ICD-10-CM

## 2010-08-30 NOTE — Assessment & Plan Note (Signed)
Summary: 1 YR FU SURGICAL CLEARANCE  -SRS OK TO OVERBOOK PER DR. MCDOW...   Visit Type:  Follow-up Primary Provider:  Dr. Gar Ponto   History of Present Illness: 45 year old male presents for followup. He was seen in the office back in February 2011. We recently received communication from [facility] regarding request for temporary discontinuation of Plavix in order to proceed with an elective lumbar puncture.  Faxed copy of ECG from 18 January showed sinus tachycardia with poor anterior R-wave progression, nonspecific ST-T changes, principally in the high lateral leads. These changes are old compared to prior tracing from March 2011.  Recent lab work shows BUN 14, creatinine 1.0, potassium 3.6, troponin I 0.01, AST 27, ALT 34, hemoglobin 15.6, platelets 210.  He reports being seen in the emergency department in January with findings of apparent infection related to a recent abdominal hernia repair with mesh. He states he completed a course of antibiotics is due to see his surgeon back next week.  He denies any anginal chest pain or progressive shortness of breath. States that he had quit smoking for up to 2 months but resumed when his mother passed away. He is working on cutting back and plans to try to quit attempt again.  He reports compliance with medications, states his blood pressure "fluctuates " It was measured quite high when he came in, however came down fairly quickly.  States that antihypertensives and lipid management are being followed by Dr. Quillian Quince, with office visits every 3 months.  Preventive Screening-Counseling & Management  Alcohol-Tobacco     Smoking Status: current     Smoking Cessation Counseling: yes     Packs/Day: 1/2 PPD  Current Medications (verified): 1)  Ecotrin 325 Mg Tbec (Aspirin) .... Take 1 Tablet By Mouth Once A Day 2)  Advair Diskus 250-50 Mcg/dose Aepb (Fluticasone-Salmeterol) .... Take One Inhalation Two Times A Day 3)   Citalopram Hydrobromide 40 Mg Tabs (Citalopram Hydrobromide) .... Take 1 Tablet By Mouth Once A Day 4)  Plavix 75 Mg Tabs (Clopidogrel Bisulfate) .... Take 1 Tablet By Mouth Once A Day 5)  Isosorbide Mononitrate Cr 60 Mg Xr24h-Tab (Isosorbide Mononitrate) .... Take 1 Tablet By Mouth Two Times A Day 6)  Losartan Potassium-Hctz 100-25 Mg Tabs (Losartan Potassium-Hctz) .... Take 1 Tablet By Mouth Once A Day 7)  Alprazolam 1 Mg Tabs (Alprazolam) .... Take 1 Tablet By Mouth 4 Times A Day 8)  Nitrostat 0.4 Mg Subl (Nitroglycerin) .... Use As Directed 9)  Flonase 50 Mcg/act Susp (Fluticasone Propionate) .... Two Sprays/nostril  Daily 10)  Tramadol Hcl 50 Mg Tabs (Tramadol Hcl) .... Take 2 Tablet By Mouth Three Times A Day  Allergies (verified): 1)  ! Harlene Ramus 2)  ! Ibuprofen 3)  ! * Contrast Dye  Comments:  Nurse/Medical Assistant: The patient's medication list and allergies were reviewed with the patient and were updated in the Medication and Allergy Lists.  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  No             reaction with Isovue 300 by allergy testing. 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  The patient denies anorexia, fever, weight loss, chest pain, syncope, dyspnea on exertion, peripheral edema, prolonged cough, melena, and hematochezia.         Otherwise reviewed and negative except as outlined.  Vital Signs:  Patient profile:   45  year old male Height:      71 inches Weight:      270 pounds BMI:     37.79 Pulse rate:   94 / minute BP sitting:   152 / 102  (left arm) Cuff size:   large  Vitals Entered By: Georgina Peer (August 24, 2010 1:02 PM)  Nutrition Counseling: Patient's BMI is greater than 25 and therefore counseled on weight management options.  Serial Vital Signs/Assessments:  Time      Position  BP       Pulse  Resp  Temp     By 1:07 PM             137/86   [place]   Physical Exam  Additional Exam:  Obese male in no acute distress. HEENT: Conjunctiva and lids normal, oropharynx clear. Neck: Supple, no elevated JVP or carotid bruits. Lungs: Clear to auscultation, nonlabored. Cardiac: Regular rate and rhythm, no significant systolic murmur. Thorax: Well-healed sternal incision. Abdomen: Soft, nontender, bowel sounds present. Dressed area in the epigastric region, clean and dry. Skin: Warm and dry. No ecchymosis. Musculoskeletal: No kyphosis. Extremities: No pitting edema. Neuropsychiatric: Alert and oriented x3, affect appropriate.   Impression & Recommendations:  Problem # 1:  CORONARY ATHEROSCLEROSIS NATIVE CORONARY ARTERY (ICD-414.01)  Symptomatically stable on present medical regimen, reviewed above. Beta blocker therapy has been discontinued over time. He is not reporting any recent angina or unusual shortness of breath. ECG from January was reviewed, similar to prior tracing from last year. At this point would anticipate that he should be able to temporarily hold aspirin and Plavix for planned elective lumbar puncture, resuming when safe thereafter. Otherwise will schedule a 6 month followup visit.  The following medications were removed from the medication list:    Coreg 25 Mg Tabs (Carvedilol) .[name]... Take 1 tablet by mouth twice a day His updated medication list for this problem includes:    Ecotrin 325 Mg Tbec (Aspirin) .[name]... Take 1 tablet by mouth once a  day    Plavix 75 Mg Tabs (Clopidogrel bisulfate) .[name]... Take 1 tablet by mouth once a day    Isosorbide Mononitrate Cr 60 Mg Xr24h-tab (Isosorbide mononitrate) .[name]... Take 1 tablet by mouth two times a day    Nitrostat 0.4 Mg Subl (Nitroglycerin) ..... Use as directed  Problem # 2:  TOBACCO ABUSE (ICD-305.1)  He has been cutting back. We discussed complete smoking cessation, nicotine replacement strategies.  Problem # 3:  MIXED HYPERLIPIDEMIA (ICD-272.2)  Followed by Dr. Quillian Quince.  The following medications were removed from the medication list:    Simvastatin 80 Mg Tabs (Simvastatin) .[name]... Take 1 tablet by mouth once a day    Niaspan 500 Mg Cr-tabs (Niacin (antihyperlipidemic)) .[name]... Take 2 tablet by mouth once a day  Problem # 4:  ESSENTIAL HYPERTENSION, BENIGN (ICD-401.1)  Blood pressure medication adjustments as per Dr. Quillian Quince. Followup blood pressure in the office today  was 137/86.  The following medications were removed from the medication list:    Coreg 25 Mg Tabs (Carvedilol) .[name]... Take 1 tablet by mouth twice a day His updated medication list for this problem includes:    Ecotrin 325 Mg Tbec (Aspirin) .[name]... Take 1 tablet by mouth once a day    Losartan Potassium-hctz 100-25 Mg Tabs (Losartan potassium-hctz) .[name]... Take 1 tablet by mouth once a day  Patient Instructions: 1)  Your physician wants you to follow-up in: 6 months. You will receive a reminder letter in the mail one-two months in advance. If you don't receive a letter, please call our office to schedule the follow-up appointment. 2)  Your physician recommends that you continue on your current medications as directed. Please refer to the Current Medication list given to you today. 3)  Your physician discussed the hazards of tobacco use.  Tobacco use cessation is recommended and techniques and options to help you quit were discussed.  Appended Document: 1 YR FU SURGICAL CLEARANCE  -SRS OK TO OVERBOOK PER DR. MCDOW... Note  faxed to [place] for clearance for LP.

## 2010-08-30 NOTE — Op Note (Signed)
Summary: Operative Report/ [facility]  Operative Report/ [facility]   Imported By: Bartholomew Boards 08/24/2010 09:59:53  _____________________________________________________________________  External Attachment:    Type:   Image     Comment:   External Document

## 2010-09-03 ENCOUNTER — Other Ambulatory Visit: Payer: Self-pay | Admitting: Diagnostic Neuroimaging

## 2010-09-03 DIAGNOSIS — R519 Headache, unspecified: Secondary | ICD-10-CM

## 2010-09-04 ENCOUNTER — Ambulatory Visit
Admission: RE | Admit: 2010-09-04 | Discharge: 2010-09-04 | Disposition: A | Discharge status: Home / Self Care | Payer: Medicare Other | Source: Ambulatory Visit | Attending: Diagnostic Neuroimaging | Admitting: Diagnostic Neuroimaging

## 2010-09-04 DIAGNOSIS — R519 Headache, unspecified: Secondary | ICD-10-CM

## 2010-10-07 LAB — DIFFERENTIAL
Basophils Absolute: 0 10*3/uL (ref 0.0–0.1)
Basophils Absolute: 0.1 10*3/uL (ref 0.0–0.1)
Basophils Relative: 0 % (ref 0–1)
Basophils Relative: 1 % (ref 0–1)
Eosinophils Absolute: 0.3 10*3/uL (ref 0.0–0.7)
Eosinophils Absolute: 0.3 10*3/uL (ref 0.0–0.7)
Eosinophils Relative: 3 % (ref 0–5)
Eosinophils Relative: 3 % (ref 0–5)
Lymphocytes Relative: 33 % (ref 12–46)
Lymphocytes Relative: 36 % (ref 12–46)
Lymphs Abs: 3.3 10*3/uL (ref 0.7–4.0)
Lymphs Abs: 3.7 10*3/uL (ref 0.7–4.0)
Monocytes Absolute: 0.7 10*3/uL (ref 0.1–1.0)
Monocytes Absolute: 0.8 10*3/uL (ref 0.1–1.0)
Monocytes Relative: 7 % (ref 3–12)
Monocytes Relative: 8 % (ref 3–12)
Neutro Abs: 5.4 10*3/uL (ref 1.7–7.7)
Neutro Abs: 5.6 10*3/uL (ref 1.7–7.7)
Neutrophils Relative %: 53 % (ref 43–77)
Neutrophils Relative %: 57 % (ref 43–77)

## 2010-10-07 LAB — POCT CARDIAC MARKERS
CKMB, poc: <1 ng/mL — ABNORMAL LOW (ref 1.0–8.0)
Myoglobin, poc: 57.8 ng/mL (ref 12–200)
Troponin i, poc: <0.05 ng/mL (ref 0.00–0.09)

## 2010-10-07 LAB — CBC
HCT: 44.7 % (ref 39.0–52.0)
HCT: 46.3 % (ref 39.0–52.0)
Hemoglobin: 15.8 g/dL (ref 13.0–17.0)
Hemoglobin: 16.4 g/dL (ref 13.0–17.0)
MCHC: 35.3 g/dL (ref 30.0–36.0)
MCHC: 35.4 g/dL (ref 30.0–36.0)
MCV: 87.9 fL (ref 78.0–100.0)
MCV: 89.4 fL (ref 78.0–100.0)
Platelets: 183 10*3/uL (ref 150–400)
Platelets: 194 10*3/uL (ref 150–400)
RBC: 4.99 MIL/uL (ref 4.22–5.81)
RBC: 5.26 MIL/uL (ref 4.22–5.81)
RDW: 13.3 % (ref 11.5–15.5)
RDW: 13.4 % (ref 11.5–15.5)
WBC: 10 10*3/uL (ref 4.0–10.5)
WBC: 10.3 10*3/uL (ref 4.0–10.5)

## 2010-10-07 LAB — COMPREHENSIVE METABOLIC PANEL
ALT: 40 U/L (ref 0–53)
AST: 31 U/L (ref 0–37)
Albumin: 4 g/dL (ref 3.5–5.2)
Alkaline Phosphatase: 50 U/L (ref 39–117)
BUN: 14 mg/dL (ref 6–23)
CO2: 25 mEq/L (ref 19–32)
Calcium: 9.2 mg/dL (ref 8.4–10.5)
Chloride: 105 mEq/L (ref 96–112)
Creatinine, Ser: 1.27 mg/dL (ref 0.4–1.5)
GFR calc Af Amer: >60 mL/min (ref >60)
GFR calc non Af Amer: >60 mL/min (ref >60)
Glucose, Bld: 94 mg/dL (ref 70–99)
Potassium: 3.6 mEq/L (ref 3.5–5.1)
Sodium: 139 mEq/L (ref 135–145)
Total Bilirubin: 0.5 mg/dL (ref 0.3–1.2)
Total Protein: 7.2 g/dL (ref 6.0–8.3)

## 2010-10-07 LAB — BASIC METABOLIC PANEL
BUN: 11 mg/dL (ref 6–23)
CO2: 27 mEq/L (ref 19–32)
Calcium: 9.1 mg/dL (ref 8.4–10.5)
Chloride: 106 mEq/L (ref 96–112)
Creatinine, Ser: 0.92 mg/dL (ref 0.4–1.5)
GFR calc Af Amer: >60 mL/min (ref >60)
GFR calc non Af Amer: >60 mL/min (ref >60)
Glucose, Bld: 94 mg/dL (ref 70–99)
Potassium: 3.4 mEq/L — ABNORMAL LOW (ref 3.5–5.1)
Sodium: 139 mEq/L (ref 135–145)

## 2010-10-07 LAB — LIPASE, BLOOD: Lipase: 41 U/L (ref 11–59)

## 2010-10-29 LAB — CBC
HCT: 31.9 % — ABNORMAL LOW (ref 39.0–52.0)
HCT: 31.9 % — ABNORMAL LOW (ref 39.0–52.0)
HCT: 33.1 % — ABNORMAL LOW (ref 39.0–52.0)
HCT: 33.2 % — ABNORMAL LOW (ref 39.0–52.0)
HCT: 34 % — ABNORMAL LOW (ref 39.0–52.0)
HCT: 34.7 % — ABNORMAL LOW (ref 39.0–52.0)
HCT: 35.9 % — ABNORMAL LOW (ref 39.0–52.0)
HCT: 36.3 % — ABNORMAL LOW (ref 39.0–52.0)
HCT: 36.4 % — ABNORMAL LOW (ref 39.0–52.0)
HCT: 46 % (ref 39.0–52.0)
Hemoglobin: 10.9 g/dL — ABNORMAL LOW (ref 13.0–17.0)
Hemoglobin: 11.1 g/dL — ABNORMAL LOW (ref 13.0–17.0)
Hemoglobin: 11.2 g/dL — ABNORMAL LOW (ref 13.0–17.0)
Hemoglobin: 11.3 g/dL — ABNORMAL LOW (ref 13.0–17.0)
Hemoglobin: 11.5 g/dL — ABNORMAL LOW (ref 13.0–17.0)
Hemoglobin: 11.6 g/dL — ABNORMAL LOW (ref 13.0–17.0)
Hemoglobin: 11.9 g/dL — ABNORMAL LOW (ref 13.0–17.0)
Hemoglobin: 12.3 g/dL — ABNORMAL LOW (ref 13.0–17.0)
Hemoglobin: 12.4 g/dL — ABNORMAL LOW (ref 13.0–17.0)
Hemoglobin: 16.1 g/dL (ref 13.0–17.0)
MCHC: 33.3 g/dL (ref 30.0–36.0)
MCHC: 33.4 g/dL (ref 30.0–36.0)
MCHC: 33.7 g/dL (ref 30.0–36.0)
MCHC: 33.7 g/dL (ref 30.0–36.0)
MCHC: 33.8 g/dL (ref 30.0–36.0)
MCHC: 34 g/dL (ref 30.0–36.0)
MCHC: 34.2 g/dL (ref 30.0–36.0)
MCHC: 34.3 g/dL (ref 30.0–36.0)
MCHC: 34.7 g/dL (ref 30.0–36.0)
MCHC: 35 g/dL (ref 30.0–36.0)
MCV: 87.7 fL (ref 78.0–100.0)
MCV: 89.2 fL (ref 78.0–100.0)
MCV: 89.7 fL (ref 78.0–100.0)
MCV: 89.9 fL (ref 78.0–100.0)
MCV: 89.9 fL (ref 78.0–100.0)
MCV: 90.2 fL (ref 78.0–100.0)
MCV: 90.2 fL (ref 78.0–100.0)
MCV: 90.3 fL (ref 78.0–100.0)
MCV: 90.6 fL (ref 78.0–100.0)
MCV: 90.6 fL (ref 78.0–100.0)
Platelets: 117 10*3/uL — ABNORMAL LOW (ref 150–400)
Platelets: 118 10*3/uL — ABNORMAL LOW (ref 150–400)
Platelets: 120 10*3/uL — ABNORMAL LOW (ref 150–400)
Platelets: 136 10*3/uL — ABNORMAL LOW (ref 150–400)
Platelets: 137 10*3/uL — ABNORMAL LOW (ref 150–400)
Platelets: 145 10*3/uL — ABNORMAL LOW (ref 150–400)
Platelets: 175 10*3/uL (ref 150–400)
Platelets: 189 10*3/uL (ref 150–400)
Platelets: 240 10*3/uL (ref 150–400)
Platelets: 282 10*3/uL (ref 150–400)
RBC: 3.54 MIL/uL — ABNORMAL LOW (ref 4.22–5.81)
RBC: 3.58 MIL/uL — ABNORMAL LOW (ref 4.22–5.81)
RBC: 3.68 MIL/uL — ABNORMAL LOW (ref 4.22–5.81)
RBC: 3.68 MIL/uL — ABNORMAL LOW (ref 4.22–5.81)
RBC: 3.76 MIL/uL — ABNORMAL LOW (ref 4.22–5.81)
RBC: 3.84 MIL/uL — ABNORMAL LOW (ref 4.22–5.81)
RBC: 3.96 MIL/uL — ABNORMAL LOW (ref 4.22–5.81)
RBC: 4.05 MIL/uL — ABNORMAL LOW (ref 4.22–5.81)
RBC: 4.05 MIL/uL — ABNORMAL LOW (ref 4.22–5.81)
RBC: 5.25 MIL/uL (ref 4.22–5.81)
RDW: 13.2 % (ref 11.5–15.5)
RDW: 13.3 % (ref 11.5–15.5)
RDW: 13.3 % (ref 11.5–15.5)
RDW: 13.4 % (ref 11.5–15.5)
RDW: 13.4 % (ref 11.5–15.5)
RDW: 13.5 % (ref 11.5–15.5)
RDW: 13.6 % (ref 11.5–15.5)
RDW: 13.6 % (ref 11.5–15.5)
RDW: 13.6 % (ref 11.5–15.5)
RDW: 13.8 % (ref 11.5–15.5)
WBC: 12.5 10*3/uL — ABNORMAL HIGH (ref 4.0–10.5)
WBC: 14.3 10*3/uL — ABNORMAL HIGH (ref 4.0–10.5)
WBC: 14.6 10*3/uL — ABNORMAL HIGH (ref 4.0–10.5)
WBC: 15.6 10*3/uL — ABNORMAL HIGH (ref 4.0–10.5)
WBC: 15.6 10*3/uL — ABNORMAL HIGH (ref 4.0–10.5)
WBC: 15.8 10*3/uL — ABNORMAL HIGH (ref 4.0–10.5)
WBC: 17.5 10*3/uL — ABNORMAL HIGH (ref 4.0–10.5)
WBC: 18.3 10*3/uL — ABNORMAL HIGH (ref 4.0–10.5)
WBC: 19.4 10*3/uL — ABNORMAL HIGH (ref 4.0–10.5)
WBC: 23.3 10*3/uL — ABNORMAL HIGH (ref 4.0–10.5)

## 2010-10-29 LAB — COMPREHENSIVE METABOLIC PANEL
ALT: 38 U/L (ref 0–53)
ALT: 56 U/L — ABNORMAL HIGH (ref 0–53)
ALT: 57 U/L — ABNORMAL HIGH (ref 0–53)
AST: 25 U/L (ref 0–37)
AST: 60 U/L — ABNORMAL HIGH (ref 0–37)
AST: 64 U/L — ABNORMAL HIGH (ref 0–37)
Albumin: 2.7 g/dL — ABNORMAL LOW (ref 3.5–5.2)
Albumin: 2.8 g/dL — ABNORMAL LOW (ref 3.5–5.2)
Albumin: 3.4 g/dL — ABNORMAL LOW (ref 3.5–5.2)
Alkaline Phosphatase: 49 U/L (ref 39–117)
Alkaline Phosphatase: 51 U/L (ref 39–117)
Alkaline Phosphatase: 53 U/L (ref 39–117)
BUN: 11 mg/dL (ref 6–23)
BUN: 31 mg/dL — ABNORMAL HIGH (ref 6–23)
BUN: 38 mg/dL — ABNORMAL HIGH (ref 6–23)
CO2: 24 mEq/L (ref 19–32)
CO2: 30 mEq/L (ref 19–32)
CO2: 33 mEq/L — ABNORMAL HIGH (ref 19–32)
Calcium: 8.9 mg/dL (ref 8.4–10.5)
Calcium: 9 mg/dL (ref 8.4–10.5)
Calcium: 9.7 mg/dL (ref 8.4–10.5)
Chloride: 108 mEq/L (ref 96–112)
Chloride: 94 mEq/L — ABNORMAL LOW (ref 96–112)
Chloride: 98 mEq/L (ref 96–112)
Creatinine, Ser: 0.83 mg/dL (ref 0.4–1.5)
Creatinine, Ser: 1.29 mg/dL (ref 0.4–1.5)
Creatinine, Ser: 1.3 mg/dL (ref 0.4–1.5)
GFR calc Af Amer: >60 mL/min (ref >60)
GFR calc Af Amer: >60 mL/min (ref >60)
GFR calc Af Amer: >60 mL/min (ref >60)
GFR calc non Af Amer: >60 mL/min (ref >60)
GFR calc non Af Amer: >60 mL/min (ref >60)
GFR calc non Af Amer: >60 mL/min (ref >60)
Glucose, Bld: 100 mg/dL — ABNORMAL HIGH (ref 70–99)
Glucose, Bld: 127 mg/dL — ABNORMAL HIGH (ref 70–99)
Glucose, Bld: 143 mg/dL — ABNORMAL HIGH (ref 70–99)
Potassium: 3.3 mEq/L — ABNORMAL LOW (ref 3.5–5.1)
Potassium: 3.8 mEq/L (ref 3.5–5.1)
Potassium: 4.3 mEq/L (ref 3.5–5.1)
Sodium: 136 mEq/L (ref 135–145)
Sodium: 137 mEq/L (ref 135–145)
Sodium: 139 mEq/L (ref 135–145)
Total Bilirubin: 1.1 mg/dL (ref 0.3–1.2)
Total Bilirubin: 1.3 mg/dL — ABNORMAL HIGH (ref 0.3–1.2)
Total Bilirubin: 1.3 mg/dL — ABNORMAL HIGH (ref 0.3–1.2)
Total Protein: 6 g/dL (ref 6.0–8.3)
Total Protein: 6.4 g/dL (ref 6.0–8.3)
Total Protein: 6.4 g/dL (ref 6.0–8.3)

## 2010-10-29 LAB — URINALYSIS, ROUTINE W REFLEX MICROSCOPIC
Bilirubin Urine: NEGATIVE
Glucose, UA: NEGATIVE mg/dL
Hgb urine dipstick: NEGATIVE
Ketones, ur: NEGATIVE mg/dL
Nitrite: NEGATIVE
Protein, ur: NEGATIVE mg/dL
Specific Gravity, Urine: 1.022 (ref 1.005–1.030)
Urobilinogen, UA: 0.2 mg/dL (ref 0.0–1.0)
pH: 6 (ref 5.0–8.0)

## 2010-10-29 LAB — GLUCOSE, CAPILLARY
Glucose-Capillary: 104 mg/dL — ABNORMAL HIGH (ref 70–99)
Glucose-Capillary: 108 mg/dL — ABNORMAL HIGH (ref 70–99)
Glucose-Capillary: 109 mg/dL — ABNORMAL HIGH (ref 70–99)
Glucose-Capillary: 110 mg/dL — ABNORMAL HIGH (ref 70–99)
Glucose-Capillary: 112 mg/dL — ABNORMAL HIGH (ref 70–99)
Glucose-Capillary: 115 mg/dL — ABNORMAL HIGH (ref 70–99)
Glucose-Capillary: 116 mg/dL — ABNORMAL HIGH (ref 70–99)
Glucose-Capillary: 118 mg/dL — ABNORMAL HIGH (ref 70–99)
Glucose-Capillary: 119 mg/dL — ABNORMAL HIGH (ref 70–99)
Glucose-Capillary: 119 mg/dL — ABNORMAL HIGH (ref 70–99)
Glucose-Capillary: 122 mg/dL — ABNORMAL HIGH (ref 70–99)
Glucose-Capillary: 124 mg/dL — ABNORMAL HIGH (ref 70–99)
Glucose-Capillary: 124 mg/dL — ABNORMAL HIGH (ref 70–99)
Glucose-Capillary: 127 mg/dL — ABNORMAL HIGH (ref 70–99)
Glucose-Capillary: 128 mg/dL — ABNORMAL HIGH (ref 70–99)
Glucose-Capillary: 130 mg/dL — ABNORMAL HIGH (ref 70–99)
Glucose-Capillary: 130 mg/dL — ABNORMAL HIGH (ref 70–99)
Glucose-Capillary: 130 mg/dL — ABNORMAL HIGH (ref 70–99)
Glucose-Capillary: 133 mg/dL — ABNORMAL HIGH (ref 70–99)
Glucose-Capillary: 134 mg/dL — ABNORMAL HIGH (ref 70–99)
Glucose-Capillary: 136 mg/dL — ABNORMAL HIGH (ref 70–99)
Glucose-Capillary: 136 mg/dL — ABNORMAL HIGH (ref 70–99)
Glucose-Capillary: 139 mg/dL — ABNORMAL HIGH (ref 70–99)
Glucose-Capillary: 142 mg/dL — ABNORMAL HIGH (ref 70–99)
Glucose-Capillary: 142 mg/dL — ABNORMAL HIGH (ref 70–99)
Glucose-Capillary: 154 mg/dL — ABNORMAL HIGH (ref 70–99)
Glucose-Capillary: 156 mg/dL — ABNORMAL HIGH (ref 70–99)
Glucose-Capillary: 166 mg/dL — ABNORMAL HIGH (ref 70–99)
Glucose-Capillary: 177 mg/dL — ABNORMAL HIGH (ref 70–99)
Glucose-Capillary: 96 mg/dL (ref 70–99)

## 2010-10-29 LAB — BASIC METABOLIC PANEL
BUN: 18 mg/dL (ref 6–23)
BUN: 19 mg/dL (ref 6–23)
BUN: 29 mg/dL — ABNORMAL HIGH (ref 6–23)
BUN: 29 mg/dL — ABNORMAL HIGH (ref 6–23)
BUN: 37 mg/dL — ABNORMAL HIGH (ref 6–23)
CO2: 25 mEq/L (ref 19–32)
CO2: 29 mEq/L (ref 19–32)
CO2: 29 mEq/L (ref 19–32)
CO2: 30 mEq/L (ref 19–32)
CO2: 30 mEq/L (ref 19–32)
Calcium: 7.8 mg/dL — ABNORMAL LOW (ref 8.4–10.5)
Calcium: 8.1 mg/dL — ABNORMAL LOW (ref 8.4–10.5)
Calcium: 8.3 mg/dL — ABNORMAL LOW (ref 8.4–10.5)
Calcium: 8.4 mg/dL (ref 8.4–10.5)
Calcium: 9.4 mg/dL (ref 8.4–10.5)
Chloride: 104 mEq/L (ref 96–112)
Chloride: 96 mEq/L (ref 96–112)
Chloride: 99 mEq/L (ref 96–112)
Chloride: 99 mEq/L (ref 96–112)
Chloride: 99 mEq/L (ref 96–112)
Creatinine, Ser: 1 mg/dL (ref 0.4–1.5)
Creatinine, Ser: 1.06 mg/dL (ref 0.4–1.5)
Creatinine, Ser: 1.15 mg/dL (ref 0.4–1.5)
Creatinine, Ser: 1.27 mg/dL (ref 0.4–1.5)
Creatinine, Ser: 1.45 mg/dL (ref 0.4–1.5)
GFR calc Af Amer: >60 mL/min (ref >60)
GFR calc Af Amer: >60 mL/min (ref >60)
GFR calc Af Amer: >60 mL/min (ref >60)
GFR calc Af Amer: >60 mL/min (ref >60)
GFR calc Af Amer: >60 mL/min (ref >60)
GFR calc non Af Amer: 54 mL/min — ABNORMAL LOW (ref >60)
GFR calc non Af Amer: >60 mL/min (ref >60)
GFR calc non Af Amer: >60 mL/min (ref >60)
GFR calc non Af Amer: >60 mL/min (ref >60)
GFR calc non Af Amer: >60 mL/min (ref >60)
Glucose, Bld: 113 mg/dL — ABNORMAL HIGH (ref 70–99)
Glucose, Bld: 117 mg/dL — ABNORMAL HIGH (ref 70–99)
Glucose, Bld: 120 mg/dL — ABNORMAL HIGH (ref 70–99)
Glucose, Bld: 126 mg/dL — ABNORMAL HIGH (ref 70–99)
Glucose, Bld: 156 mg/dL — ABNORMAL HIGH (ref 70–99)
Potassium: 3.3 mEq/L — ABNORMAL LOW (ref 3.5–5.1)
Potassium: 3.5 mEq/L (ref 3.5–5.1)
Potassium: 3.8 mEq/L (ref 3.5–5.1)
Potassium: 4 mEq/L (ref 3.5–5.1)
Potassium: 4.2 mEq/L (ref 3.5–5.1)
Sodium: 135 mEq/L (ref 135–145)
Sodium: 135 mEq/L (ref 135–145)
Sodium: 136 mEq/L (ref 135–145)
Sodium: 136 mEq/L (ref 135–145)
Sodium: 137 mEq/L (ref 135–145)

## 2010-10-29 LAB — POCT I-STAT, CHEM 8
BUN: 19 mg/dL (ref 6–23)
BUN: 24 mg/dL — ABNORMAL HIGH (ref 6–23)
Calcium, Ion: 1.16 mmol/L (ref 1.12–1.32)
Calcium, Ion: 1.16 mmol/L (ref 1.12–1.32)
Chloride: 100 mEq/L (ref 96–112)
Chloride: 106 mEq/L (ref 96–112)
Creatinine, Ser: 1.2 mg/dL (ref 0.4–1.5)
Creatinine, Ser: 1.3 mg/dL (ref 0.4–1.5)
Glucose, Bld: 123 mg/dL — ABNORMAL HIGH (ref 70–99)
Glucose, Bld: 133 mg/dL — ABNORMAL HIGH (ref 70–99)
HCT: 35 % — ABNORMAL LOW (ref 39.0–52.0)
HCT: 38 % — ABNORMAL LOW (ref 39.0–52.0)
Hemoglobin: 11.9 g/dL — ABNORMAL LOW (ref 13.0–17.0)
Hemoglobin: 12.9 g/dL — ABNORMAL LOW (ref 13.0–17.0)
Potassium: 3.9 mEq/L (ref 3.5–5.1)
Potassium: 4.7 mEq/L (ref 3.5–5.1)
Sodium: 138 mEq/L (ref 135–145)
Sodium: 141 mEq/L (ref 135–145)
TCO2: 24 mmol/L (ref 0–100)
TCO2: 29 mmol/L (ref 0–100)

## 2010-10-29 LAB — TYPE AND SCREEN
ABO/RH(D): A POS
Antibody Screen: NEGATIVE

## 2010-10-29 LAB — POCT I-STAT 4, (NA,K, GLUC, HGB,HCT)
Glucose, Bld: 112 mg/dL — ABNORMAL HIGH (ref 70–99)
Glucose, Bld: 113 mg/dL — ABNORMAL HIGH (ref 70–99)
Glucose, Bld: 121 mg/dL — ABNORMAL HIGH (ref 70–99)
Glucose, Bld: 121 mg/dL — ABNORMAL HIGH (ref 70–99)
Glucose, Bld: 133 mg/dL — ABNORMAL HIGH (ref 70–99)
Glucose, Bld: 147 mg/dL — ABNORMAL HIGH (ref 70–99)
HCT: 28 % — ABNORMAL LOW (ref 39.0–52.0)
HCT: 30 % — ABNORMAL LOW (ref 39.0–52.0)
HCT: 32 % — ABNORMAL LOW (ref 39.0–52.0)
HCT: 35 % — ABNORMAL LOW (ref 39.0–52.0)
HCT: 41 % (ref 39.0–52.0)
HCT: 45 % (ref 39.0–52.0)
Hemoglobin: 10.2 g/dL — ABNORMAL LOW (ref 13.0–17.0)
Hemoglobin: 10.9 g/dL — ABNORMAL LOW (ref 13.0–17.0)
Hemoglobin: 11.9 g/dL — ABNORMAL LOW (ref 13.0–17.0)
Hemoglobin: 13.9 g/dL (ref 13.0–17.0)
Hemoglobin: 15.3 g/dL (ref 13.0–17.0)
Hemoglobin: 9.5 g/dL — ABNORMAL LOW (ref 13.0–17.0)
Potassium: 3.8 mEq/L (ref 3.5–5.1)
Potassium: 4.1 mEq/L (ref 3.5–5.1)
Potassium: 4.6 mEq/L (ref 3.5–5.1)
Potassium: 5 mEq/L (ref 3.5–5.1)
Potassium: 5.3 mEq/L — ABNORMAL HIGH (ref 3.5–5.1)
Potassium: 7.1 mEq/L (ref 3.5–5.1)
Sodium: 131 mEq/L — ABNORMAL LOW (ref 135–145)
Sodium: 135 mEq/L (ref 135–145)
Sodium: 135 mEq/L (ref 135–145)
Sodium: 137 mEq/L (ref 135–145)
Sodium: 137 mEq/L (ref 135–145)
Sodium: 142 mEq/L (ref 135–145)

## 2010-10-29 LAB — POCT I-STAT 3, ART BLOOD GAS (G3+)
Acid-Base Excess: 2 mmol/L (ref 0.0–2.0)
Acid-base deficit: 1 mmol/L (ref 0.0–2.0)
Acid-base deficit: 1 mmol/L (ref 0.0–2.0)
Acid-base deficit: 3 mmol/L — ABNORMAL HIGH (ref 0.0–2.0)
Acid-base deficit: 3 mmol/L — ABNORMAL HIGH (ref 0.0–2.0)
Bicarbonate: 22.9 mEq/L (ref 20.0–24.0)
Bicarbonate: 23.7 mEq/L (ref 20.0–24.0)
Bicarbonate: 23.8 mEq/L (ref 20.0–24.0)
Bicarbonate: 24.5 mEq/L — ABNORMAL HIGH (ref 20.0–24.0)
Bicarbonate: 26.7 mEq/L — ABNORMAL HIGH (ref 20.0–24.0)
O2 Saturation: 100 %
O2 Saturation: 100 %
O2 Saturation: 92 %
O2 Saturation: 92 %
O2 Saturation: 97 %
Patient temperature: 31
Patient temperature: 33.5
Patient temperature: 36.9
Patient temperature: 37.1
Patient temperature: 37.3
TCO2: 24 mmol/L (ref 0–100)
TCO2: 25 mmol/L (ref 0–100)
TCO2: 25 mmol/L (ref 0–100)
TCO2: 26 mmol/L (ref 0–100)
TCO2: 29 mmol/L (ref 0–100)
pCO2 arterial: 24.2 mmHg — ABNORMAL LOW (ref 35.0–45.0)
pCO2 arterial: 43.3 mmHg (ref 35.0–45.0)
pCO2 arterial: 44.1 mmHg (ref 35.0–45.0)
pCO2 arterial: 45.8 mmHg — ABNORMAL HIGH (ref 35.0–45.0)
pCO2 arterial: 49.8 mmHg — ABNORMAL HIGH (ref 35.0–45.0)
pH, Arterial: 7.285 — ABNORMAL LOW (ref 7.350–7.450)
pH, Arterial: 7.331 — ABNORMAL LOW (ref 7.350–7.450)
pH, Arterial: 7.343 — ABNORMAL LOW (ref 7.350–7.450)
pH, Arterial: 7.353 (ref 7.350–7.450)
pH, Arterial: 7.589 — ABNORMAL HIGH (ref 7.350–7.450)
pO2, Arterial: 282 mmHg — ABNORMAL HIGH (ref 80.0–100.0)
pO2, Arterial: 325 mmHg — ABNORMAL HIGH (ref 80.0–100.0)
pO2, Arterial: 70 mmHg — ABNORMAL LOW (ref 80.0–100.0)
pO2, Arterial: 71 mmHg — ABNORMAL LOW (ref 80.0–100.0)
pO2, Arterial: 96 mmHg (ref 80.0–100.0)

## 2010-10-29 LAB — CULTURE, RESPIRATORY

## 2010-10-29 LAB — CULTURE, RESPIRATORY W GRAM STAIN: Culture: NORMAL

## 2010-10-29 LAB — MAGNESIUM
Magnesium: 2.7 mg/dL — ABNORMAL HIGH (ref 1.5–2.5)
Magnesium: 2.8 mg/dL — ABNORMAL HIGH (ref 1.5–2.5)
Magnesium: 3.1 mg/dL — ABNORMAL HIGH (ref 1.5–2.5)

## 2010-10-29 LAB — BLOOD GAS, ARTERIAL
Acid-Base Excess: 2.2 mmol/L — ABNORMAL HIGH (ref 0.0–2.0)
Bicarbonate: 26.2 mEq/L — ABNORMAL HIGH (ref 20.0–24.0)
Drawn by: 206361
FIO2: 0.21 %
O2 Saturation: 97.7 %
Patient temperature: 98.6
TCO2: 27.5 mmol/L (ref 0–100)
pCO2 arterial: 40.6 mmHg (ref 35.0–45.0)
pH, Arterial: 7.427 (ref 7.350–7.450)
pO2, Arterial: 88.8 mmHg (ref 80.0–100.0)

## 2010-10-29 LAB — POCT I-STAT 3, VENOUS BLOOD GAS (G3P V)
Acid-base deficit: 2 mmol/L (ref 0.0–2.0)
Bicarbonate: 25.4 mEq/L — ABNORMAL HIGH (ref 20.0–24.0)
O2 Saturation: 80 %
Patient temperature: 30.9
TCO2: 27 mmol/L (ref 0–100)
pCO2, Ven: 43.1 mmHg — ABNORMAL LOW (ref 45.0–50.0)
pH, Ven: 7.346 — ABNORMAL HIGH (ref 7.250–7.300)
pO2, Ven: 34 mmHg (ref 30.0–45.0)

## 2010-10-29 LAB — HEMOGLOBIN AND HEMATOCRIT, BLOOD
HCT: 29 % — ABNORMAL LOW (ref 39.0–52.0)
Hemoglobin: 9.9 g/dL — ABNORMAL LOW (ref 13.0–17.0)

## 2010-10-29 LAB — EXPECTORATED SPUTUM ASSESSMENT W GRAM STAIN, RFLX TO RESP C

## 2010-10-29 LAB — CREATININE, SERUM
Creatinine, Ser: 1.11 mg/dL (ref 0.4–1.5)
Creatinine, Ser: 1.19 mg/dL (ref 0.4–1.5)
GFR calc Af Amer: >60 mL/min (ref >60)
GFR calc Af Amer: >60 mL/min (ref >60)
GFR calc non Af Amer: >60 mL/min (ref >60)
GFR calc non Af Amer: >60 mL/min (ref >60)

## 2010-10-29 LAB — EXPECTORATED SPUTUM ASSESSMENT W REFEX TO RESP CULTURE

## 2010-10-29 LAB — APTT
aPTT: 28 seconds (ref 24–37)
aPTT: 33 seconds (ref 24–37)

## 2010-10-29 LAB — HEMOGLOBIN A1C
Hgb A1c MFr Bld: 5.6 % (ref 4.6–6.1)
Mean Plasma Glucose: 114 mg/dL

## 2010-10-29 LAB — PROTIME-INR
INR: 1 (ref 0.00–1.49)
INR: 1.3 (ref 0.00–1.49)
Prothrombin Time: 13.3 seconds (ref 11.6–15.2)
Prothrombin Time: 17 seconds — ABNORMAL HIGH (ref 11.6–15.2)

## 2010-10-29 LAB — ABO/RH: ABO/RH(D): A POS

## 2010-10-29 LAB — SEDIMENTATION RATE: Sed Rate: 104 mm/hr — ABNORMAL HIGH (ref 0–16)

## 2010-10-29 LAB — PLATELET COUNT: Platelets: 137 10*3/uL — ABNORMAL LOW (ref 150–400)

## 2010-11-27 NOTE — Assessment & Plan Note (Signed)
[place] OFFICE NOTE   NAME:[name], [name]                 MRN:          [id number]  DATE:06/24/2008                            DOB:          12-25-1965    REASON FOR VISIT:  Class III angina and need for cardiac  catheterization.   HISTORY OF PRESENT ILLNESS:  [name] is a 45 year old gentleman with  extensive premature CAD.  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  He was treated in 2008 with balloon angioplasty in that  area.  He had moderate-to-severe stenosis in the right coronary artery  and was managed medically.  He continued to have anginal symptoms and  presented for relook catheterization and probable PCI of the right  coronary artery.  However, he had an anaphylactic reaction to the  contrast media.  He required hemodynamic support and recovered over the  course of 24-48 hours.  He was subsequently discharged from the hospital  on medical therapy.  He came back in for planned PCI because of  recurrent symptoms.  He was aggressively premedicated with steroids, H2  blockers, and antihistamines.  However, in spite of aggressive  premedication, he developed a recurrent reaction causing marked  shortness of breath and hypotension.  He was treated supportively in the  ICU and his reaction occurred after only a few diagnostic images were  performed.  Omnipaque has been used for all of his procedures.   The patient has subsequently been followed as an outpatient by Dr.  Domenic Polite.  He unfortunately has continued to have progressive anginal  symptoms.  He is having rest pain, most notably at night.  His symptoms  feel similar to his past cardiac pain.  He and his wife strongly desire  repeat attempt at angioplasty of the right coronary artery because of  his worsening angina.  He has undergone evaluation at  [facility]  and their department of Allergy and Immunology.  He has been tested for  alternative contrast agents.  He did not react to Isovue and the  recommendation has been placed to use Isovue, if he needs a repeat  catheterization.  This has been obtained by the cardiac cath lab at  [facility], specifically for [name], he has recurrent  problems.   From a symptomatic standpoint, he also complains of exertional dyspnea.  This is stable overtime.  His chest pain occurs with both activity and  rest.  He has no other acute complaints.   MEDICATIONS:  1. Plavix 75 mg daily.  2. Aspirin 325 mg daily.  3. Coreg 25 mg twice daily.  4. Lisinopril/HCTZ 20/12.5 mg twice daily.  5. Imdur 60 mg twice daily.  6. Simvastatin 80 mg daily.  7. Niaspan 500 mg 2 at bedtime.  8. Celexa 40 mg daily.  9. Xanax 1 mg q.i.d.  10.Advair 250/50 mcg twice daily.  11.Flonase 2 puffs daily.  12.Vitamin D 50,000 units weekly.   ALLERGIES:  CONTRAST MEDIA and ALEVE.   PHYSICAL EXAMINATION:  GENERAL:  The patient is an obese male in no  acute distress.  VITAL SIGNS:  Weight 271 pounds, blood pressure 148/90 in the right arm  and 140/80 in the left arm, heart rate 82, and respiratory rate 12.  HEENT:  Normal.  NECK:  Normal carotid upstrokes.  No bruits.  JVP normal.  No  thyromegaly or thyroid nodules.  LUNGS:  Clear bilaterally.  HEART:  Regular rate and rhythm.  No murmurs or gallops.  ABDOMEN:  Soft, nontender, and obese.  No organomegaly.  EXTREMITIES:  No clubbing, cyanosis, or edema.  EXTREMITIES:  Peripheral pulses are intact and equal.  SKIN:  Warm and dry without rash.   ASSESSMENT:  This is a 45 year old gentleman with extensive coronary  artery disease and progressive angina.  He currently has class III  symptoms.  This occurs in the setting of known coronary artery disease  and moderately severe stenosis of the right coronary artery.  I had a  very long and frank  discussion with both [name] and his wife.  They  both are strongly pushing for a repeat cardiac catheterization and  intervention.  I told them of my concerns and the risk of recurrent  anaphylaxis.  However, he clearly has worsening anginal symptoms.  I  have carefully reviewed the recommendations put forth by Dr. Herschel Senegal  at [facility].  Considering his severe reaction in the past, I will  plan on performing his procedure under general anesthesia for airway  protection.  His past reactions have included significant tongue  swelling and considering his body habitus, I am concerned about  protecting his airway, in [name] of a recurrent reaction.  We will plan on  using Isovue as our contrast agent as described above.  The  recommendation has been placed to give him a test dose and then waiting  approximately 20 minutes before moving forward with coronary  angiography.  If he has a major reaction to the test dose, the procedure  will be aborted.  If he does not react after 20 minutes, the procedure  will be continued.   The patient wishes to defer the procedure until after the holidays.  He  will be scheduled for early January.  Again, risks and indications were  reviewed in detail.  The risks included the normal risk of the  procedure, in addition, there is a risk of hemodynamic compromise  secondary to anaphylaxis and even death.  The patient and wife are aware  of the risks.  The possibility of continued medical therapy was  discussed as an alternative.  They wish to proceed with cardiac  catheterization.  Total time spent with the patient and family was  greater than 45 minutes.     Juanda Bond. Burt Knack, MD  Electronically Signed   MDC/MedQ  DD: 06/28/2008  DT: 06/29/2008  Job #: [id number]   cc:   Satira Sark, MD  Gar Ponto

## 2010-11-27 NOTE — Assessment & Plan Note (Signed)
[place] OFFICE NOTE   NAME:[name], [name] POPP                 MRN:          [id number]  DATE:02/18/2007                            DOB:          08-23-1965    ADDENDUM:  I did speak to Dr. Ishmael Holter again on February 23, 2007.  She notes  that she has discussed [name]' situation with several of her  colleagues and they have decided to refer him to Dr. Blair Dolphin on March 17, 2007.  He is an Horticulturist, commercial at [employer].  Dr. Ishmael Holter noted that it is  certainly reasonable to consider pretreating the patient with 5-7 days  of prednisone before an elective catheterization in the future.  However, she would like him to see Dr. Blair Dolphin to have further workup.  I  explained to Dr. Ishmael Holter that at this point in time we do not plan to cath  [name] unless it is an emergency.  However, it would be helpful to  have more workup performed should that be the case in the future.      [name], PA-C  Electronically Signed      Ernestine Mcmurray, MD,FACC  Electronically Signed   SW/MedQ  DD: 02/23/2007  DT: 02/24/2007  Job #: [id number]

## 2010-11-27 NOTE — Assessment & Plan Note (Signed)
[place] OFFICE NOTE   NAME:Fernholz, ANTAWN TOSO                 MRN:          [id number]  DATE:08/17/2008                            DOB:          1965-10-05    PRIMARY CARE PHYSICIAN:  Dr. Gar Ponto.   REASON FOR VISIT:  Post-hospital followup.   HISTORY OF PRESENT ILLNESS:  Mr. Holtorf presents to the office following  recent coronary artery bypass grafting by Dr. Cyndia Bent.  He was discharged  from [facility] on August 04, 2008, having undergone surgery  for progressive multivessel cardiovascular disease with progressive  anginal symptoms.  He tolerated his diagnostic cardiac catheterization  on July 21, 2008, with Dr. Burt Knack without any anaphylactic response.  Isovue 300 was used at that time, following his previous allergy testing  as was discussed in my previous notes.  He had a LIMA to the left  anterior descending, saphenous vein graft to the diagonal, sequential  saphenous vein graft to the first and second obtuse marginals, and  saphenous vein graft to the distal right coronary artery with endoscopic  vein harvesting bilaterally.  I reviewed Dr. Laurene Footman operative report  which suggests that Mr. Mckeone would not be an optimal candidate for  redo surgery.  His 2 marginal branches were intramyocardial and he also  had fairly diffuse disease involving the left anterior descending  without an optimal landing spot for regrafting.  The right coronary  artery was also diffusely diseased and the posterior descending was  positioned beneath a large posterior descending vein.   Mr. Warters has postoperative soreness and has done some walking around  his house, although nothing major beyond that.  He has not done any  heavy lifting.  He reports compliance with his medications which are  listed below.  He has not yet taken in this morning, resulting in his  increased heart rate at this time.  He  has had no anginal chest pain and  his breathing has been stable.  He has had some general leg soreness,  although no asymmetric soreness or swelling.  Electrocardiogram shows  sinus tachycardia at 101 beats per minute with nonspecific ST-T wave  changes throughout and evidence of previous anterior wall myocardial  infarction.  Today, we once again discussed smoking cessation.  Mr.  Kreiner stopped smoking for 2 weeks, but states that he has started back.  I spoke with the patient and his wife about the absolute necessity for  him to stop smoking.   ALLERGIES:  ALEVE and INTRAVENOUS CONTRAST DYE (anaphylaxis).  Of note,  the patient recently tolerated Isovue 300 during cardiac catheterization  in January based on allergy testing results.  He was premedicated in the  standard fashion.   MEDICATIONS:  1. Aspirin 325 mg p.o. daily.  2. Zocor 80 mg p.o. daily.  3. Niaspan 500 mg 2 tablets p.o. nightly.  4. Celexa 40 mg p.o. daily.  5. Xanax 1 mg p.o. q.i.d.  6. Advair 250/50 one puff b.i.d.  7. Vitamin D 50,000 units weekly.  8. Coreg 12.5 mg p.o. b.i.d.  9. Multivitamin daily.  10.Oxycodone  5 mg 1-2 tablets p.o. q.4-6 h. p.r.n.   REVIEW OF SYSTEMS:  As described in the history of present illness.  Otherwise, negative.   PHYSICAL EXAMINATION:  VITAL SIGNS:  Blood pressure is 123/85, heart  rate is in the 90s at rest and regular, weight is 257 pounds.  GENERAL:  He is an obese male and in no acute distress.  HEENT:  Conjunctiva is normal.  Oropharynx is clear with moist mucosa.  NECK:  Supple.  No loud carotid bruits or elevated jugular venous  pressure.  No thyromegaly.  LUNGS:  Clear without labored breathing.  Diminished breath sounds  noted.  No wheezing.  CARDIAC:  Regular rate and rhythm.  No pathologic systolic murmur or  pericardial rub.  No S3 gallop.  CHEST:  Chest wall sternal incision is well healing without erythema or  exudate.  ABDOMEN:  Abdomen wall keyhole  incisions also healing well without  exudate.  Abdomen is otherwise soft, and nontender.  Normoactive bowel  sounds.  EXTREMITIES:  No significant pitting edema.  Vein harvest sites are  stable and healing well.  Distal pulses 2+.  SKIN:  Warm and dry.  MUSCULOSKELETAL:  No kyphosis noted.  NEUROPSYCHIATRIC:  The patient is alert and oriented x3.  Affect is  appropriate.   IMPRESSION AND RECOMMENDATIONS:  Multivessel cardiovascular disease with  left ventricular ejection fraction of 45-50%, status post coronary  artery bypass grafting as outlined above.  Mr. Northup will plan to  continue his present medications and I will resume the Plavix at 75 mg  daily, given his fairly aggressive atherosclerosis.  He has already been  contacted by cardiac rehabilitation and I have encouraged him to  continue on when appropriate.  He is due to see Dr. Cyndia Bent on August 29, 2008.  We talked about continuing to increase his regular walking.  I have once again underscored the critical importance of smoking  cessation.  I gave him a limited refill on oxycodone given postoperative  pain.  We will plan to see him back over the next month.     Satira Sark, MD  Electronically Signed    SGM/MedQ  DD: 08/17/2008  DT: 08/17/2008  Job #: [id number]   cc:   Gar Ponto

## 2010-11-27 NOTE — Assessment & Plan Note (Signed)
[place] OFFICE NOTE   NAME:[name], [name]                 MRN:          [id number]  DATE:11/19/2006                            DOB:          05-29-1966    PRIMARY CARE PHYSICIAN:  Dr. Gar Ponto   REASON FOR VISIT:  Cardiac followup.   HISTORY OF PRESENT ILLNESS:  I saw [name] back in April of this  year.  Please see my extensive note from his last visit.  Symptomatically, he reports progressive episodes of chest pain, despite  medical therapy adjustments, which improve with nitroglycerin spray.  He  does not report worsening pain, but describes increased frequency.  Based on our previous discussion, [name] has continued to consider  the matter of a repeat coronary angiogram with an eye towards  percutaneous intervention to address residual right coronary artery  disease.  He is very much in favor of proceeding with this at this  point.  I have reviewed with him the potential risks, including his  recently documented anaphylactic reaction to contrast dye.  He has  frankly, over the years, manifested episodes of recurrent chest pain,  although not all associated with clear obstructive coronary  atherosclerosis, and I made it clear to him that there is no guarantee  that by undergoing intervention to the right coronary artery he would  have complete resolution of his chest pain symptoms.  This would  obviously be the goal, however.  We discussed the option of continuing  to advance medical therapy versus proceeding on to percutaneous  intervention as outlined previously by Dr. Burt Knack, and [name]  prefers intervention.  He had a followup Cardiolite that was abnormal,  as discussed in my prior note.   ALLERGIES:  ALEVE and INTRAVENOUS CONTRAST (anaphylactic).   PRESENT MEDICATIONS:  1. Coreg 12.5 mg p.o. b.i.d.  2. Imdur 60 mg p.o. q.a.m. and 30 mg p.o. q.p.m.  3. Lisinopril 20 mg  p.o. daily.  4. Vytorin 10/80 mg p.o. daily.  5. Alprazolam 1 mg p.o. q.i.d.  6. Enteric coated aspirin 325 mg p.o. daily.  7. Advair 250/50 one puff b.i.d.  8. Flonase 50 micrograms two sprays to each nostril daily.  9. Plavix 75 mg p.o. daily.  10.Niaspan 500 mg p.o. b.i.d.  11.Actos 45 mg p.o. daily.  12.Nitroglycerin spray p.r.n.   REVIEW OF SYSTEMS:  As described in History of Present Illness.  He has  had no bleeding problems.   EXAMINATION:  Blood pressure today is 141/86, heart rate is 95, weight  is 296 pounds.  This is an overweight male, denying active chest pain.  HEENT:  Conjunctiva is normal.  Oropharynx clear.  NECK:  Supple, no elevated jugular venous pressure, without bruits.  No  thyromegaly is noted.  LUNGS:  Clear.  Nonlabored breathing at rest.  CARDIAC EXAM:  Reveals a regular rate and rhythm, somewhat distant heart  sounds.  No loud murmur or S3 gallop.  ABDOMEN:  Soft, nontender, no bruits.  EXTREMITIES:  Exhibit no significant pitting edema.  Distal pulses are  1+.  SKIN:  Warm and dry.  MUSCULOSKELETAL:  No kyphosis is noted.  NEUROPSYCHIATRIC:  Patient is alert and oriented times three.   IMPRESSION/RECOMMENDATIONS:  1. Coronary artery disease, including recent anterior wall myocardial      infarction due to in-stent thrombosis within the left anterior      descending that was treated with angioplasty.  [name] manifests      residual disease, including long diffuse 75% stenosis within the      right coronary artery that was already considered for intervention,      although the procedure was aborted due to a new anaphylactic      reaction to contrast dye.  Subsequent to this, he has been managed      medically and had a followup Cardiolite that was abnormal      predominantly in the anteroapical distribution consistent with his      left anterior descending disease, but also in the inferior apical      distribution as evidenced by a combination  of scar and peri-infarct      ischemia which could potentially be related to the right coronary      artery.  We increased his Coreg and Imdur at last visit and Mr.      [name] reports increased frequency of chest pain.  He has      considered the matter, after our discussion of potential risks, and      prefers to proceed with another attempt at intervention to the      right coronary artery.  Our plan at this point is to advance Coreg      to 25 mg p.o. b.i.d. and I will ask Dr. Burt Knack to review films,      planning a percutaneous intervention to the right coronary artery      early next week (at the patient's request).  I have recommended      that, if his symptoms change abruptly, he should seek medical      attention more urgently.  I also made it clear that it was not      entirely certain that the patient's chest pain would resolve      following intervention in the right coronary artery.  He does have      a history of recurrent chest pain, not all associated with clearly-      documented ischemia.  He still prefers an invasive approach.  2. Further plans to follow.     Satira Sark, MD  Electronically Signed    SGM/MedQ  DD: 11/19/2006  DT: 11/19/2006  Job #: [id number]   cc:   Gar Ponto

## 2010-11-27 NOTE — Op Note (Signed)
NAME:  [name], [name]          ACCOUNT NO.:  [id number]   MEDICAL RECORD NO.:  [id number]          PATIENT TYPE:  AMB   LOCATION:  SDS                          FACILITY:  [facility]   PHYSICIAN:  Melrose Nakayama, MD  DATE OF BIRTH:  Apr 06, 1966   DATE OF PROCEDURE:  03/30/2007  DATE OF DISCHARGE:                               OPERATIVE REPORT   PREOPERATIVE DIAGNOSIS:  Right carpal tunnel syndrome   POSTOPERATIVE DIAGNOSIS:  Right carpal tunnel syndrome   The attending surgeon, Dr. Iran Planas IV, was scrubbed and present for  the entire procedure.   ASSISTANT SURGEON:  None.   PROCEDURE:  Right open carpal tunnel release.   ANESTHESIA:  Local 0.25% Marcaine, 1% lidocaine, 10 mL mixture with IV  sedation.   SURGICAL INDICATIONS:  [name] is a 45 year old gentleman with  multiple medical problems.  He had been seen and treated for carpal  tunnel for the last several months.  His condition had been worse over  the last 8 months.  Nonsurgical treatment options were not working out  for him.  The patient was saw me as a second opinion and because of his  pain and numbness and tingling wanted to undergo surgery on his right  wrist.  Risks, benefits and alternatives were discussed in detail with  the patient, and a signed informed consent was obtained on the day of  surgery.  Risks include but not limited to bleeding, infection, nerve  damage - partial or permanent, persistent symptoms, need for further  intervention and dystrophy of the hand as well as risk of anesthesia.  All questions were addressed preoperatively.   DESCRIPTION OF PROCEDURE:  The patient was properly identified in the  preoperative holding area, and a mark with a permanent marker was made  on the right wrist to indicate the correct operative site.  The patient  was then brought back to the operating room and placed supine on the  anesthesia room table.  IV sedation was administered.  The patient  tolerated  this well.  He received preoperative antibiotics prior to any  skin incisions with his underlying diabetes.  A forearm tourniquet was  placed on the right upper extremity.  The right upper extremity was then  prepped with Hibiclens and sterilely draped.  The local anesthetic was  administered during the IV sedation.  Once the limb was prepped and  draped, a time-out was called, the correct site was identified and the  surgical procedure then begun.  Using the wrist crease along the radial  border of the ring finger, dissection was carried down through the skin  and subcutaneous tissue.  The palmar fascia was then ended identified  and then incised sharply.  The transverse carpal ligament was  identified, and with careful retraction and with visualization, the  transverse carpal ligament was then incised longitudinally under direct  visualization both proximally and distally.  After release of the  transverse carpal ligament, the wound was then thoroughly irrigated.  There were not any abnormalities within the contents of the carpal  canal.  The tourniquet was then deflated.  After  deflation of the  tourniquet, hemostasis of subcutaneous fat and veins were then  cauterized with the bipolar cautery.  Hemostasis was obtained with the  bipolar cautery and direct pressure.  The wound was then closed with 4-0  horizontal mattress nylon sutures.  A sterile compressive dressing and a  light bulky hand splint was then applied.  The patient tolerated the  procedure well and was taken to the recovery room in good condition.   POSTOPERATIVE PLAN:  The patient will be seen back in approximately 10-  12 days for wound check and suture removal.  Will then begin some  postoperative carpal tunnel protocol.  The patient tolerated the  procedure well.      Melrose Nakayama, MD  Electronically Signed     FWO/MEDQ  D:  03/30/2007  T:  03/31/2007  Job:  [id number]

## 2010-11-27 NOTE — Assessment & Plan Note (Signed)
[facility] OFFICE NOTE   NAME:[name], [name]                 MRN:          [id number]  DATE:02/18/2007                            DOB:          Dec 06, 1965    PRIMARY CARE PHYSICIAN:  Gar Ponto, M.D.   PRIMARY CARDIOLOGIST:  Satira Sark, MD.   HISTORY OF PRESENT ILLNESS:  Mr. Dicker is a 45 year old male patient  with a history of coronary artery disease, status post drug eluting  stent placement to the LAD in 2003, who presented to [place] with acute ST elevation myocardial infarction September 04, 2006, secondary to in-stent thrombosis of the LAD.  He was treated  with balloon angioplasty.  He had significant disease in the right  coronary artery and the plan was for staged intervention.  The patient  returned to [facility] in March 2008.  During  catheterization he developed an anaphylactic reaction and the procedure  was aborted.  He followed up with Dr. Domenic Polite in the office on a couple  of occasions since that time.  He did have a Cardiolite study done in  March 2008, that revealed large anteroapical, inferoapical defect which  demonstrated significant reversibility in addition to mild to moderate  degree of scar/infarct.  His EF was 42% on that study.   It was eventually decided to proceed with another attempt at  intervention of the RCA due to the patient's continued chest discomfort.  He was pretreated for contrast dye allergy.  The patient underwent  cardiac catheterization Nov 24, 2006, with Dr. Burt Knack.  During the  procedure, he did develop tachycardia, severe coughing, nausea and  hypotension as well as erythema on his chest and some swelling in his  face.  He was treated with IV Solu-Medrol and IV Benadryl and the  procedure was aborted.  He did see the LAD and noted that there was a  patent stent and the RCA had a long 70-75%  stenosis in the proximal and  mid segment.   The patient, since we last saw him, was seen by Dr. Dominga Ferry. Ishmael Holter at  the [facility] in [place], [place].  I actually spoke to her on the telephone today.  The only question she  had was whether or not low osmolality contrast was used during the  patient's procedure.  She had nothing else to add regarding prevention  of anaphylaxis with contrast other than using low osmolar contrast dye.  She did tell me that she would be speaking to several of her colleagues  in [place], [place], today to see if there were any other  suggestions.  Our cath lab in [place], [place], uses nonionic  Omnipaque 350.  After phone conversation between Dr. Dannielle Burn and Dr.  Burt Knack, we did learn that Dr. Burt Knack did not use the low osmolar  contrast media.   The patient presents to the office today for follow-up.  He needs  surgical clearance to have carpal tunnel surgery on the right.  He is  apparently  in a significant amount of pain and continues to request pain  medication.  He does note that the surgeon in [place], [place],  wants to stop his Plavix three days prior to his surgery.   He denies any changes in his chest pain.  He continues to have  intermittent chest pain.  This can come on at rest or with exertion.  It  is not necessarily brought on with exertion.  It is a substernal sharp  pain that lasts a minute or less.  He has only gotten short of breath  with it one time.  He denies any associated nausea or diaphoresis.  He  denies any syncope or near syncope.  He denies any tachy palpitations.   CURRENT MEDICATIONS:  1. Coreg 12.5 mg b.i.d.  2. Imdur 60 mg in the morning and 30 mg in the evening.  3. Vytorin 10/80 mg daily.  4. Alprazolam 1 mg q.i.d.  5. Lisinopril 20 mg daily.  6. Plavix 75 mg daily.  7. Niaspan 500 mg b.i.d.  8. Aspirin 325 mg daily.  9. Flonase 50 mcg two puffs  daily.  10.Albuterol/Advair 250/50 b.i.d.  11.Actos 45 mg daily.  12.Nitroglycerin p.r.n. chest pain.   ALLERGIES:  IV CONTRAST.  ALEVE.   SOCIAL HISTORY:  He continues to smoke cigarettes.   REVIEW OF SYSTEMS:  Please see HPI.  Denies any fevers, chills, cough,  melena, hematochezia, hematuria or dysuria.  Rest of the review of  systems are negative.   PHYSICAL EXAMINATION:  VITAL SIGNS:  Blood pressure 143/99, pulse 84,  weight 258 pounds.  GENERAL APPEARANCE:  He is a well-developed, well-nourished man in no  acute distress.  HEENT:  Normal.  NECK:  Without JVD.  LUNGS:  Clear to auscultation bilaterally without wheezing, rhonchi or  rales.  CARDIOVASCULAR:  S1 and S2, regular rate and rhythm without murmurs.  ABDOMEN:  Soft and nontender with normal active bowel sounds, no  organomegaly.  EXTREMITIES:  Without edema.  Calves soft and nontender.  Skin warm and  dry.  NEUROLOGIC:  He is alert and oriented x3.  Cranial nerves II-XII grossly  intact.  Carotids without bruits bilaterally.   Electrocardiogram reveals sinus rhythm with heart rate of 82, normal  axis, no acute changes, Q-waves in V1-3.   IMPRESSION:  1. Chronic chest pain.  2. Coronary artery disease.      a.     Status post [place] drug eluting stent placement to the left       anterior descending in 2003.      b.     Acute ST elevation myocardial infarction September 04, 2006,       secondary to in-stent thrombosis treated with angioplasty (the       patient notes he was off Plavix at that time).      c.     Residual right coronary artery disease of 70-75% in the       proximal and mid segment.  3. Significant intravenous dye allergy, resistant to pretreatment      measures.  4. Ischemic cardiomyopathy with an ejection fraction of 42% by last      nuclear scan.  5. Hyperlipidemia.  6. Hypertension.  7. Sleep apnea.  8. Diabetes mellitus.  9. Tobacco abuse.  10.Depression/anxiety.  11.Carpal tunnel  syndrome, right side.   PLAN:  The patient presents to the office today for follow-up.  He would  like clearance for surgery for his carpal tunnel syndrome.  He continues  to have chest discomfort.  He has had difficulty with cardiac  catheterization with anaphylaxis x2 even in the setting of pretreatment.   At this point in time, we do not recommend discontinuing his Plavix.  This is absolutely contraindicated.  He should never come off his  aspirin or Plavix unless he has a life threatening emergency that  requires it.  He runs the risk of acute stent thrombosis while off of  Plavix.   The most reasonable course at this point in time is to cancel his carpal  tunnel surgery altogether.  There is no other cardiovascular testing  warranted at this time prior to proceeding with surgery.  It should be  noted that if surgery were performed and he developed a bleeding  complication, that Plavix and aspirin CANNOT be stopped.   I have recommended that the patient get back with his surgeon.  I have  also written him a note that outlines this extensively.  Again, at this  point in time, we do not recommend surgery given the risk of stopping  Plavix and aspirin.   We did have conversation between Dr. Dannielle Burn and Dr. Burt Knack today.  Dr.  Burt Knack noted that he did not use low osmolar contrast.  He was not sure  that this would make any difference.  He noted that this would be kept  in mind if the patient ever presented back to the hospital with acute  infarction that required emergent catheterization.  However, at this  point in time, given the patient's history, he will not be set up for  routine cardiac catheterization unless there is any other compelling  evidence to warrant it.   The patient's Coreg will be increased to 25 mg twice a day.  We will  bring him back in follow-up with Dr. Domenic Polite in the next six weeks.      [name], PA-C  Electronically Signed      Ernestine Mcmurray,  MD,FACC  Electronically Signed   SW/MedQ  DD: 02/18/2007  DT: 02/18/2007  Job #: [id number]

## 2010-11-27 NOTE — Assessment & Plan Note (Signed)
[facility] OFFICE NOTE   NAME:[name], [name]                 MRN:          [id number]  DATE:04/21/2007                            DOB:          April 15, 1966    REASON FOR VISIT:  Cardiac followup.   HISTORY OF PRESENT ILLNESS:  [name] was in the office back in  August, and saw [name] at that time.  He has a history of coronary  artery disease, status post drug-eluting stent placement to the left  anterior descending in 2003 with subsequent acute myocardial infarction  in February 2008 secondary to in-stent thrombosis that was treated with  angioplasty.  He does have residual 70% to 75% right coronary artery  disease that at this point is being managed medically.  [name]  course has been complicated by fairly significant anaphylactoid reaction  to contrast dye despite adequate prophylaxis that is presently being  evaluated by Allergy.  In fact, [name] tells me that he is due to  follow up in [place] this Thursday for more specific allergy testing  using different contrast agents that he has received over time.   Symptomatically, at this point, [name] is fortunately stable without  any significant angina.  He continues to smoke cigarettes, and we had a  discussion today about the critical importance of smoking cessation.  He  does report compliance with his medications, and states that since I  last saw him he has had carpal tunnel release surgery (done on Plavix),  and has tolerated this well, allowing him to come off some pain  medications.  His weight is up from 258 to 265, and he does not report  being very active.   ALLERGIES:  1. ALEVE.  2. INTRAVENOUS CONTRAST.   PRESENT MEDICATIONS:  1. Coreg 12.5 mg p.o. b.i.d.  2. Imdur 60 mg p.o. q.a.m. and 30 mg p.o. q.p.m.  3. Vytorin 10/80 mg p.o. daily.  4. Alprazolam 1 mg p.o. q.i.d.  5. Plavix 75 mg p.o. daily.  6.  Niaspan 500 mg p.o. b.i.d.  7. Aspirin 325 mg p.o. daily.  8. Flonase 50 mcg 2 sprays daily.  9. Albuterol 2 puffs as directed.  10.Advair 250/50 one puff b.i.d.   Patient is now off Actos and lisinopril (stopped by [name]), and is  on a new oral diabetes agent (details not clear).  Patient also has  nitroglycerin spray.   REVIEW OF SYSTEMS:  As described in history of present illness,  otherwise negative.   EXAMINATION:  Blood pressure 141/89.  Heart rate is 88.  Weight is 265  pounds.  This is an obese male smelling of cigarettes.  No acute distress without  active chest pain.  HEENT:  Conjunctivae look normal.  Oropharynx clear.  NECK:  Supple.  No elevated jugular venous pressure.  No loud bruits.  No thyromegaly is noted.  LUNGS:  Clear without labored breathing.  CARDIAC EXAM:  Reveals a regular rate and rhythm.  No loud murmur or  gallop.  ABDOMEN:  Obese, nontender.  EXTREMITIES:  Show no significant pitting edema.  IMPRESSION AND RECOMMENDATIONS:  1. Coronary artery disease as outlined above status post previous drug-      eluting stent placement to the LAD with residual 70% to 75% right      coronary artery disease being managed medically.  The patient is      stable without active anginal symptoms.  We discussed again the      critical importance of smoking cessation.  I have encouraged Mr.      [name] to continue to work on this.  We also discussed continued      compliance with medications.  In light of his significant contrast      allergy, he is undergoing evaluation in [facility].  He is due      for specific testing this Thursday by his report.  Hopefully, we      can determine the specific agent that is leading to his response,      and better prepare for future potential coronary interventions.      His history, risk factors, and continued smoking place him at      significant risk for future cardiac events.  I will plan to have      him follow up  in clinic over the next few months.  2. Diabetes mellitus, hypertension, hyperlipidemia, followed by Dr.      Quillian [name].  The patient has undergone some recent medication changes      as detailed above.  His goal LDL should be 70 or less.  Blood      pressure is elevated some today.  If this persists, would suggest      reinstituting lisinopril assuming his renal function was not an      issue.     [name] Sark, MD  Electronically Signed    SGM/MedQ  DD: 04/21/2007  DT: 04/21/2007  Job #: [id number]   cc:   [name]. [name] Knack, MD

## 2010-11-27 NOTE — Cardiovascular Report (Signed)
[name], [name]          ACCOUNT NO.:  [id number]   MEDICAL RECORD NO.:  [id number]          PATIENT TYPE:  INP   LOCATION:  2807                         FACILITY:  [facility]   PHYSICIAN:  Juanda Bond. Burt Knack, MD  DATE OF BIRTH:  [birth year]   DATE OF PROCEDURE:  11/24/2006  DATE OF DISCHARGE:                            CARDIAC CATHETERIZATION   PROCEDURE:  Selective coronary angiography.   PLAN:  Percutaneous coronary intervention of the right coronary artery,  but the procedure was aborted secondary to an anaphylactic reaction.   INDICATIONS:  [name] is a 45 year old gentleman who was evaluated as  an outpatient by Dr. Domenic Polite for recurrent chest pain.  He has known  moderate obstructive disease of the right coronary artery.  He has a 70-  80% lesion in the mid portion of the right coronary artery that involves  a long segment.  We had deferred on intervening due to an anaphylactic  reaction at the time of his last catheterization.  In the setting of  recurrent chest pain and abnormal stress test with ischemia and inferior  apex, we elected to premedicate him and perform elective PCI.  The  patient was premedicated with prednisone, Benadryl and Pepcid prior to  the procedure.   Risks and indications of procedure were explained to the patient.  Informed consent was obtained.  The right groin was prepped, draped and  anesthetized with 1% lidocaine.  Using modified Seldinger technique 6-  French sheath was placed in the right femoral artery.  Diagnostic images  of the left coronary artery were taken, since the patient had a recent  acute MI involving the LAD, just to make sure that his angina was coming  from the left coronary system.  After two image, a left coronary artery  Angiomax was started and a JR-4 guide catheter was inserted.  Set up  image of the right coronary artery was taken.  At this point, the  patient developed tachycardia and severe coughing.  He became  nauseated  and also hypotensive.  His skin on his chest became erythematous and he  had some swelling in the face.  It appeared he was having an  anaphylactic reaction, despite appropriate premedication.  He is given  an additional 125 mg of IV Solu-Medrol, and an additional 50 mg of IV  Benadryl with improvement in his symptoms.  Dr. Domenic Polite and I reviewed  the situation and elected to discontinue the procedure as we did not  want to expose him to any more contrast in the setting of his very  strong allergy to contrast media.   We elected to leave the sheath in, and the patient will be monitored  closely overnight with additional Benadryl given throughout the day.  Will need to consider outpatient allergy testing and possibly  desensitization, if we decide that [name] necessitates PCI in the  future.   FINDINGS:  The left main coronary artery is patent.  It bifurcates into  the LAD and left circumflex.  The LAD has a widely patent stent in its  proximal portion.  The LAD has diffuse nonobstructive disease, but there  is no significant stenosis of that vessel.   Left circumflex has nonobstructive disease in its proximal portion and  gives off a first OM branch that has nonobstructive disease.  There is  an intermediate branch that has mild nonobstructive disease as well.   The right coronary artery has a long segment of 70-75% stenosis  involving its proximal and mid segment.  There is diffuse nonobstructive  disease throughout the remainder of the mid portion of the right  coronary artery.   ASSESSMENT:  1. 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  2. Double-vessel coronary artery disease with a patent stent in the      LAD and long segment of moderate to severe stenosis in the right      coronary artery.   PLAN:  As above, will treat the patient medically for now as the  procedure was aborted secondary to his dye reaction.  Consider  outpatient allergy  desensitization versus ongoing medical therapy.  I  would be very reluctant to cath [name] in the future unless there is  some way he is able to undergo desensitization.      Juanda Bond. Burt Knack, MD     MDC/MEDQ  D:  11/24/2006  T:  11/24/2006  Job:  [id number]

## 2010-11-27 NOTE — Cardiovascular Report (Signed)
NAME:  [name], [name]          ACCOUNT NO.:  [id number]   MEDICAL RECORD NO.:  [id number]          PATIENT TYPE:  INP   LOCATION:  2550                         FACILITY:  [facility]   PHYSICIAN:  Juanda Bond. Burt Knack, MD  DATE OF BIRTH:  July 17, 1965   DATE OF PROCEDURE:  07/21/2008  DATE OF DISCHARGE:                            CARDIAC CATHETERIZATION   PROCEDURE:  1. Left heart catheterization.  2. Selective coronary angiography.  3. Left ventricular angiography.  4. Angio-Seal of the right femoral artery.   This procedure was done under general anesthesia.   PROCEDURAL INDICATIONS:  Mr. Kloos is a 45 year old gentleman with  extensive CAD.  He has had prior stenting of the LAD and presented in  2008 with an acute myocardial infarction involving the anterior wall  secondary to stent thrombosis.  He was treated with balloon angioplasty  under guidance of intravascular ultrasound.  He also had moderate  disease of the right coronary artery and in the setting of progressive  angina he was brought back for treatment of the right coronary artery in  2008.  Unfortunately he had an anaphylactic reaction to contrast.  A  second attempt was made at intervening on his right coronary artery with  appropriate premedication that included steroids, Benadryl and Pepcid.  In spite of that, he had a second severe anaphylactic reaction.  We have  attempted to treat him medically over the past year but he has had  progressive angina with low-level activity.  After extensive discussion,  we elected an evaluation by an allergist in [facility].  We were able  to find contrast that he did not react to.  Isovue was used for the  procedure.  The procedure was done under general anesthesia because at  the time of the previous procedure he had a airway obstruction secondary  to major tongue swelling.   After informed consent was obtained the patient was placed under general  anesthesia.  The right groin was  prepped, draped under normal sterile  conditions.  A 6-French sheath was placed in the right femoral artery  and a 6-French sheath was placed in the right femoral vein.  A test dose  of Isovue was given for with 2-3 mL given in the right femoral artery to  assess the access site.  At that point we waited approximately 15  minutes before proceeding to make sure that a reaction did not occur.  After about 10 minutes Angiomax was started because of planned  intervention on the right coronary artery.  Angiography was performed  with standard 6-French Judkins catheters.  However, at angiography there  was a new finding of total LAD occlusion which was collateralized by the  right coronary artery.  There was progressive disease in the left  circumflex as well.  Therefore a ventriculogram was performed to assess  LV function.  Dr. Domenic Polite came in to review the films and we both agree  that the patient will be better off long-term with coronary bypass now  that his LAD has reoccluded.  Therefore, the femoral arteriotomy was  closed with an Angio-Seal device and the patient was  transferred to the  recovery unit in stable condition.   FINDINGS:  Left ventriculography shows akinesis of the apex and  inferoapex, however, the anterior wall appears to contract normally.  The LVEF is in the range of 45% - 50%.  There is no mitral  regurgitation.   Aortic pressure 100/66 with mean of 81, left ventricular pressure  100/30.   Coronary angiography left mainstem:  The left main is patent.  There is  minor narrowing at the distal left main but no significant stenosis  present.  The left main trifurcates into the LAD and intermediate branch  in the left circumflex.   LAD:  The LAD is a large-caliber vessel proximally, there is minor  nonobstructive disease just at the level of the first diagonal.  The LAD  is totally occluded proximal to the stented segment.  The remaining  portions of the mid and  distal LAD fill from collaterals provided by the  right coronary artery.  The diagonal branch has a 50% - 70% ostial  stenosis and is a large branch vessel.   Left circumflex:  There is a large intermediate branch with 40% - 50%  proximal stenosis.   The AV groove left circumflex has a 50% - 60% proximal stenosis, it then  gives off a first OM branch.  It has a 50% - 70% stenosis before it  bifurcates into two vessels.  The AV groove circumflex courses down and  supplies a posterolateral branch and has no significant stenosis.   Right coronary artery:  The right coronary artery is diffusely diseased.  There is a long segment of 70% - 75% stenosis involving the proximal  vessel.  The midvessel further down has a long 50% stenosis, distally it  supplies a PDA branch that has minor luminal irregularities.  The PDA  fills the LAD system via septal perforators in the LAD, fills all the  way back to the proximal vessel.   ASSESSMENT:  1. Proximal left anterior descending occlusion with collaterals      provided by the right coronary artery.  2. Moderately severe right coronary artery and left circumflex      stenoses as described above.  3. Mild left ventricular dysfunction consistent with prior apical      myocardial infarction.   RECOMMENDATIONS:  Mr. Boniface has diabetes and multivessel CAD with an  occluded LAD.  His LAD could be reopened with angioplasty.  However, now  that he has developed progressive disease in his other vessels and he  has stent thrombosis his chances at long-term patency of the LAD with  percutaneous intervention is low.  I think he would be much better off  with coronary bypass and a referral will be made to [facility].  The patient  will be observed overnight as he has undergone general anesthesia and  has obstructive sleep apnea.  Will discontinue his Plavix and continue  his other medical therapy without changes.      Juanda Bond. Burt Knack, MD  Electronically  Signed     MDC/MEDQ  D:  07/21/2008  T:  07/21/2008  Job:  [id number]   cc:   Satira Sark, MD

## 2010-11-27 NOTE — Discharge Summary (Signed)
NAME:  [name], [name]          ACCOUNT NO.:  [id number]   MEDICAL RECORD NO.:  [id number]          PATIENT TYPE:  INP   LOCATION:  2550                         FACILITY:  [place]   PHYSICIAN:  Juanda Bond. Burt Knack, MD  DATE OF BIRTH:  20-Feb-1966   DATE OF ADMISSION:  07/21/2008  DATE OF DISCHARGE:  07/21/2008                               DISCHARGE SUMMARY   PRIMARY CARDIOLOGIST:  Satira Sark, MD, Ledell Noss.   PRIMARY CARE Heily Carlucci:  Dr. Gar Ponto.   DISCHARGE DIAGNOSIS:  Unstable angina.   SECONDARY DIAGNOSES:  1. Coronary artery disease status post cardiac catheterization      revealed significant multivessel disease, July 21, 2008.  2. Hypertension.  3. Hyperlipidemia,  4. Type 2 diabetes mellitus.  5. Obstructive sleep apnea.  6. Tobacco abuse.  7. Status post cholecystectomy.  8. Status post tonsillectomy.  9. History of INTRAVENOUS CONTRAST allergy with anaphylaxis.   PROCEDURES:  Elective intubation secondary to DYE allergy history. Left  heart cardiac catheterization revealing significant multivessel disease  with mild left ventricular dysfunction consistent with prior apical  myocardial infarction.   HISTORY OF PRESENT ILLNESS:  A 45 year old married Caucasian male with  the above problem list.  The patient has previously been treated with  balloon angioplasty and stenting of the proximal LAD with recurrent MI  and stent thrombosis requiring balloon angioplasty in 2008.  He  unfortunately has had progressive angina and multiple attempts was set  up for cath and PCI which was subsequently aborted secondary to IV  contrast reactions including anaphylaxis requiring intubation.  Decision  was made to retry an elective procedure and was seen by Dr. Burt Knack in  the office on June 24, 2009, and arrangements were subsequently made  for outpatient catheterization with elective intubation to ward off the  ill effects of DYE allergy.  The patient preferred to wait  until after  the holidays.   HOSPITAL COURSE:  The patient presented to the cath lab on July 21, 2008.  Anesthesia was consulted.  The patient was successfully intubated  and placed on general anesthesia.  Cardiac catheterization was performed  revealing significant multivessel disease, and Cardiothoracic Surgery  was consulted.  Postprocedure, the patient was successfully extubated  and insisted of going home.  Anesthesia felt that this was okay as the  patient was recovering well in the postanesthesia care unit.  Thoracic  Surgery arranged for outpatient followup as the patient was discharged  home in good condition.   DISCHARGE LABORATORY DATA:  None.   DISPOSITION:  The patient was actually never admitted to the hospital  since he refused admission and was cleared for discharge by Anesthesia.   FOLLOWUP PLANS AND APPOINTMENTS:  The patient followed up with Thoracic  Surgery on July 26, 2008, for consideration of coronary artery bypass  grafting.   DISCHARGE MEDICATIONS:  The patient was advised to stop his Plavix.  Otherwise, we made no changes.  He is to continue on:  1. Aspirin 325 mg daily,  2. Coreg 25 mg b.i.d.  3. Lisinopril/hydrochlorothiazide 20/25 mg b.i.d.  4. Imdur 60 mg b.i.d.  5. Simvastatin  80 mg daily.  6. Niaspan 500 mg 2 tablets nightly,  7. Celexa 40 mg daily.  8. Xanax 1 mg q.i.d.  9. Advair 250/50 one puff b.i.d.  10.Flonase 2 puffs daily.  11.Vitamin D 50,000 unit weekly.   OUTSTANDING LABORATORY STUDIES:  None.   DURATION OF DISCHARGE ENCOUNTER:  20 minutes including physician time.      [name], ANP      Juanda Bond. Burt Knack, MD  Electronically Signed    CB/MEDQ  D:  09/19/2008  T:  09/20/2008  Job:  [id number]

## 2010-11-27 NOTE — Assessment & Plan Note (Signed)
[facility] OFFICE NOTE   NAME:[name], [name]                 MRN:          [id number]  DATE:09/22/2008                            DOB:          1966/05/28    REASON FOR VISIT:  Post-surgical followup.   HISTORY OF PRESENT ILLNESS:  I saw [name] back in February following  coronary artery bypass grafting as detailed in the previous note.  He  continues to recuperate very well.  He saw Dr. Cyndia Bent in followup and is  cleared to return to driving and also to do cardiac rehabilitation as of  September 26, 2008.  [name] states that he has not smoked a cigarette in  the last 3 weeks and I congratulated him about this.  He is trying to  lose some weight and focus more on his diet.  Postsurgical chest  discomfort is improving steadily.  He otherwise feels good.   ALLERGIES:  ALEVE and ANAPHYLACTIC IGE-MEDIATED CONTRAST DYE allergy.   CURRENT MEDICATIONS:  1. Multivitamin 1 p.o. daily.  2. Aspirin 325 mg p.o. daily.  3. Advair 250/50 one puff b.i.d.  4. Citalopram 40 mg p.o. daily.  5. Simvastatin 80 mg p.o. daily.  6. Plavix 75 mg p.o. daily.  7. Imdur 60 mg p.o. b.i.d.  8. Vitamin D 50,000 units monthly.  9. Lisinopril hydrochlorothiazide 20/12.5 mg 2 tablets p.o. daily.  10.Niaspan 5 mg 2 tablets p.o. q.h.s.  11.Xanax 1 mg p.o. 2 q.i.d.  12.Nitroglycerin 0.4 mg p.r.n.  13.Flonase p.r.n.  14.Combivent.  15.Albuterol/Atrovent p.r.n.   REVIEW OF SYSTEMS:  Review of systems as outlined above.  Otherwise  negative.   PHYSICAL EXAMINATION:  VITAL SIGNS:  Blood pressure 137/90, heart rate  is 88, weight is 258 pounds, which is stable.  GENERAL:  This is an obese male in no acute distress.  NECK:  No elevated jugular venous pressure.  No loud bruits.  LUNGS:  Clear without labored breathing at rest.  CARDIAC:  Regular rate and rhythm.  No pericardial rub or S3 gallop.  CHEST:  Chest wall shows a well  healed mid sternal incision.  No  erythema or drainage.  EXTREMITIES:  No frank pitting edema.   IMPRESSION AND RECOMMENDATIONS:  Multivessel cardiovascular disease with  a left ventricular ejection fraction of 45-50%, status post coronary  artery bypass grafting in January of this year.  [name] is  recuperating well and plans to begin cardiac rehabilitation later this  month.  He has not smoked any cigarettes in the last 3 weeks.  We have  talked extensively about smoking cessation.  He is also trying to work  more on diet and exercise with a goal of weight loss.  We will plan to  continue with his present medical regimen and I will see him back over  the next 3 months for further clinical followup.  He will need  reassessment of lipids over time.     Satira Sark, MD  Electronically Signed    SGM/MedQ  DD: 09/22/2008  DT: 09/23/2008  Job #: [id number]   cc:   Coralyn Mark  Quillian Quince

## 2010-11-27 NOTE — Assessment & Plan Note (Signed)
[facility] OFFICE NOTE   NAME:[name], [name]                 MRN:          [id number]  DATE:12/03/2007                            DOB:          [birth year]    PRIMARY CARE PHYSICIAN:  [name].   REASON FOR VISIT:  Cardiology follow-up.   HISTORY OF PRESENT ILLNESS:  [name] was recently admitted to  [facility] with recurrent chest pain.  He ruled out for  myocardial infarction and was seen by [name] in consultation.  An  echocardiogram was obtained on April 29 demonstrating a left ventricle  ejection fraction of 45-50%.  The inferior wall was described as being  hypokinetic, and there were also wall motion abnormalities in the  anterior distribution consistent with the patient's previous infarction.  He ultimately underwent a Cardiolite study which showed evidence of  previous anterior wall myocardial infarction consistent with his  history.  He also had a moderate-sized inferior wall defect that was  fixed and potentially suggestive of scar in this distribution as well.  No large areas of ischemia were noted.  Ejection fraction by that study  was calculated lower at 33%.  He was discharged on medical therapy.   [name] cardiac history includes a previous anterior wall  myocardial infarction treated with drug-eluting stent placement to left  anterior descending in 2003.  He had a subsequent acute myocardial  infarction in February 2008 due to in-stent thrombosis that was treated  with angioplasty.  He was found at that time to have residual 70% and  75% right coronary artery stenoses that at this point are being managed  medically.  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  We referred him for  follow-  up allergy testing through [facility] and he had abnormal responses  to both Omnipaque and Visipaque.  We most recently referred him back in  early April for repeat testing at which time he was tested to Isovue 300  (iopamidol).  He had a negative test to this particular agent.  In  reviewing the correspondence from [name], it was indicated that  Isovue 300 could be considered for use at cardiac catheterization,  although there was still a possibility that the patient might still  manifest a reaction to this and also it was clear that he would still  need to have pretreatment with steroids and Benadryl to blunt other  potential non-IgE mediated reactions.  It was suggested that if he were  to come to a cardiac catheterization that Isovue 300 could be given in a  test dose diluted at 1:10, and then the patient will be watched for 20  minutes to make sure he did not manifest a reaction before proceeding.   I reviewed all this information with the patient today.  He denies  having any recent chest pain at this time.  We talked about his  medications, and I spoke with him again about the  critical importance of  smoking cessation.  He states that he is trying to work on this.  We  also talked about a basic walking regiment but no overly strenuous  activity.   In reviewing his recent studies, it may well be that he has at some  point had an inferior infarction based on evidence of scar in this area.  We reviewed the rationale for continuing medical therapy at this time  and not considering elective angiography.   ALLERGIES:  ANAPHYLACTIC IGE MEDIATED RESPONSE TO CONTRAST DYE.  THE  PATIENT DID RECENTLY TEST NEGATIVE TO ISOVUE 300 AS DISCUSSED ABOVE.  ADDITIONAL ALLERGIES INCLUDE ALEVE.   PRESENT MEDICATIONS:  1. Coreg 12.5 mg p.o. b.i.d.  2. Imdur 60 mg p.o. q.a.m., 30 mg p.o. q.p.m.  3. Vytorin 10/80 mg p.o. daily.  4. Alprazolam 1 mg p.o. q.i.d.  5. Plavix 75 mg p.o. daily.  6. Niaspan 500 mg p.o.  b.i.d.  7. Aspirin 325 mg p.o. daily.  8. Albuterol inhaler.  9. Ultram 50 mg p.o. p.r.n.  10.Advair 250/50 one puff b.i.d.  11.Sublingual nitroglycerin 0.4 mg p.r.n.  12.Flonase p.r.n.   REVIEW OF SYSTEMS:  As outlined above.  No active bleeding problems.  Otherwise negative.   EXAMINATION:  On examination, blood pressure 141/91, heart rate is 86,  weight is 264 pounds.  The patient is in no acute distress without any  active chest pain.  HEENT:  Conjunctiva is normal.  Pharynx is clear.  NECK:  Is supple.  No elevated jugular venous pressure.  No loud bruits.  LUNGS:  Clear with diminished breath sounds.  No wheezing or labored  breathing.  CARDIAC EXAM:  Reveals a regular rate and rhythm.  No S3 gallop or  pericardial rub.  ABDOMEN:  Soft, nontender, no active bowel sounds.  EXTREMITIES:  Exhibit no frank pitting edema.  Distal pulses are 2+.  SKIN:  Warm and dry.  MUSCULOSKELETAL:  No kyphosis noted.  NEUROPSYCHIATRIC:  The patient is alert and oriented x3.  Affect seems  appropriate.   IMPRESSION AND RECOMMENDATIONS:  Cardiovascular disease as outlined  above.  Ejection fraction recently assessed at 45-50% with inferior and  anterior wall motion abnormalities suggestive of scar in these  distributions.  Ejection fraction was as low as 33% on Cardiolite, which  also demonstrated scar without active ischemia.  The patient's history  clearly includes anterior wall infarction, and it may in fact be that he  has had infarction in the inferior wall since that time, particularly  given his known disease in the right coronary artery.  He has a clear  anaphylactic IgE mediated response to contrast dyes, and therefore, at  this point, we do not plan any elective cardiac catheterization,  particularly if we can manage him medically.  He did recently undergo  allergy testing to Isovue 300, and his agent was found to be appropriate  as detailed above.  We have already contacted the  cardiac  catheterization lab at [facility] and asked that this  particular contrast agent be stocked specifically for this patient were  he to present with an acute myocardial infarction.  Of note, it is  suggested that he receive a test dose diluted 1:10 with observation for  approximately 20 minutes to make sure that he does not manifest any  major reaction prior to proceeding with the procedure in full.  It is  still a possibility that he may react to this, even though his allergy  testing was reassuring.  He would also need to be pretreated with  steroids and Benadryl for other non-IgE mediated contrast responses.  I  suppose lytic therapy could always be considered instead.  I reviewed  all this information with the patient today again in detail, and he was  appreciative our discussion.  I was frank with him about his risks as it  relates to progressive heart disease and also risks of the  interventional treatment of his heart disease.  I also underscored the  importance of smoking cessation and also compliance with his  medications.  Weight loss will be beneficial.  I have asked him to  increase his Coreg to 25 mg p.o. b.i.d. and also his Imdur to 60 mg p.o.  b.i.d.  He is not on an ACE inhibitor at this point, which we will  certainly need to have another look at given his decrease in the left  ventricular function over time.  At one point, he was actually on  lisinopril, but this was discontinued along the way.     Satira Sark, MD  Electronically Signed    SGM/MedQ  DD: 12/03/2007  DT: 12/03/2007  Job #: [id number]   cc:   Gar [name]

## 2010-11-27 NOTE — Assessment & Plan Note (Signed)
[place] OFFICE NOTE   NAME:Iwasaki, BURLEIGH PLASS                 MRN:          [id number]  DATE:06/24/2007                            DOB:          11-18-1965    REASON FOR VISIT:  Routine cardiac followup.   HISTORY OF PRESENT ILLNESS:  Mr. Nardone returns for a visit, having last  been seen in October.  He reports being under a lot of stress due to  family illness over the last few months.  His mother just recently came  home from the hospital and is staying at his home presently.  He has a  history of coronary artery disease, status post a drug-eluting stent  placement at the left anterior descending in 2003 with subsequent acute  thrombosis resulting in myocardial infarction in February, 2008.  He was  treated at that time with angioplasty.  Residual disease includes a 70-  75% right coronary artery stenosis that at this point is being managed  medically.  One of the main problems in his management is a true  anaphylactic reaction to both Omnipaque and Visipaque, based on specific  allergy testing done at [facility].  He has an IgE mediated  response to both agents and therefore cannot be adequately premedicated.  He has already manifested a response during his last procedure.   Mr. Kriner continues to smoke cigarettes.  He has had a very difficult  time quitting.  I talked with him about the critical importance of  smoking cessation, particularly in his case.  He otherwise continues on  the medications outlined below.  Lipids are being followed by Dr.  Quillian Quince.   The patient's electrocardiogram today shows a sinus rhythm with evidence  of previous anterior wall myocardial infarction.  Inferior Q waves are  also noted, old compared to his previous tracing.   In terms of chest pain, Mr. Kaba complains only of a brief, fleeing  discomfort, not predictable, and never prolonged.   ALLERGIES:   ALEVE and true anaphylactic reaction to both OMNIPAQUE and  VISIPAQUE CONTRAST DYE.   MEDICATIONS:  1. Coreg 12.5 mg p.o. b.i.d.  2. Imdur 60 mg p.o. q.a.m. and 30 mg p.o. q.p.m.  3. Vytorin 10/80 mg p.o. daily.  4. Alprazolam 1 mg p.o. q.i.d.  5. Plavix 75 mg p.o. daily.  6. Niaspan 500 mg p.o. b.i.d.  7. Aspirin 325 mg p.o. daily.  8. Albuterol 2 puffs as directed.  9. Advair 250/50 2 puffs b.i.d.  10.Sublingual nitroglycerin 0.4 mg p.r.n.   REVIEW OF SYSTEMS:  As described in the history of present illness.   PHYSICAL EXAMINATION:  Blood pressure today 145/93, down to 137/87 after  five minutes.  Heart rate in the 80s in sinus rhythm.  Oxygen saturation  98% on room air.  Weight 264 pounds.  This is a morbidly obese male in no acute distress.  HEENT:  Conjunctivae, lids normal.  Oropharynx is clear.  NECK:  Supple with no elevated jugular venous pressure, no loud bruits.  No thyromegaly is noted.  LUNGS:  Clear.  Diminished breath sounds.  Slight  expiratory squeaks.  No wheezing.  CARDIAC:  Regular rate and rhythm.  No S3 gallop or loud murmur.  No  pericardial rub.  ABDOMEN:  Soft and nontender.  Bowel sounds present.  EXTREMITIES:  No pitting edema.  SKIN:  Warm and dry.  MUSCULOSKELETAL:  No kyphosis is noted.  NEUROPSYCHIATRIC:  The patient is alert and oriented x3.   IMPRESSION/RECOMMENDATIONS:  1. Coronary artery disease, as outlined above.  Symptomatically, Mr.      Guanzon is stable on medical therapy.  I have again discussed with      him the critical importance of complete smoking cessation and also      continued medical therapy.  The management of his cardiovascular      disease is made more difficult in light of the fact that he had a      true anaphylactic reaction to both OMNIPAQUE and VISIPAQUE, the two      most commonly used agents in the cardiac catheterization lab.  I do      have calls in to our catheterization lab administrative staff,      asking  them to look into other possible contrast agents that could      be considered.  I suspect this list will be fairly short.  If we      can obtain some alternative agents, these could potentially be      tested, again, at [facility] in the allergy department.      Perhaps we may be able to come across an agent that he will      tolerate.  Otherwise, I suspect that if he presented with an acute      myocardial infarction, the best course of action would be      thrombolytic therapy.  I would certainly not anticipate any      elective percutaneous interventions in his case.  2. Hyperlipidemia:  Followed by Dr. Quillian Quince.  Goal LDL should be around      70.  3. Continued tobacco use.  4. Hypertension:  Could consider the addition of an ACE inhibitor to      his regimen, particularly if his blood pressure remains elevated.      He was on lisinopril in the past.     Satira Sark, MD  Electronically Signed    SGM/MedQ  DD: 06/24/2007  DT: 06/24/2007  Job #: [id number]   cc:   Gar Ponto

## 2010-11-27 NOTE — Assessment & Plan Note (Signed)
[place] OFFICE NOTE   NAME:Thackeray, ST KIESSLING                 MRN:          [id number]  DATE:06/20/2008                            DOB:          08-Aug-1965    PRIMARY CARE PHYSICIAN:  Dr. Gar Ponto   REASON FOR VISIT:  Scheduled followup.   HISTORY OF PRESENT ILLNESS:  I saw Mr. Blatz back in August.  His  history is well detailed in the previous notes.  Mr. Carrara states that  he has cut back his smoking to half pack per day and we have discussed  on numerous occasions the critical importance to his prognosis of total  smoking cessation.  In terms of symptom control on the regimen outlined  below, he reports progressive chest pain describing pressure and  subsequent nausea with a particularly bad episode a few weeks ago at  rest, lasting for several minutes.  He states that he thought he had  another heart attack.  He has been more fatigued and breathless with  chest pain symptoms occurring at least twice a week at this point.  Followup electrocardiogram today shows stable relatively small inferior  Q waves with poor anterolateral R-wave progression consistent with  previous anterior wall myocardial infarction.  There are associated ST-T  wave abnormalities also consistent with this, with some progressive T-  wave inversions compared to a recent tracing from mid November done at  [facility].  He reports otherwise compliance with his  medications.  He also tells me that he and his wife have been discussing  the matter and he is pushing more for an attempt at percutaneous  intervention for symptom control.  We have been trying to manage him  medically.   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  This has been well documented and  subsequently evaluated through allergy testing at [facility].  He  had abnormal allergy  testing to both Omnipaque and Visipaque, but had a  negative test to Isovue 300 (iopamidol).  He was seen by Dr. Truitt Leep  and it was indicated that Isovue 300 could be used for cardiac  catheterization realizing that there was still a possibility that the  patient might manifest an allergic response, and would need to be both  pretreated with steroids and Benadryl, as well as receive a test dose of  Isovue 300 diluted at 1-10 with observation for 20 minutes prior to  proceeding with a full procedure.  Generally, I have tried to manage Mr.  Bubier medically and avoid elective intervention and we have discussed  this on a number of occasions, again today.  Mr. Raimondo is still very  much inclined to discuss a repeat attempt at intervention and I reviewed  this with Dr. Burt Knack by phone.   <?ALLERGIES/>  Aleve and anaphylactic IgE mediated response to contrast dye.   PRESENT MEDICATIONS:  1. Plavix 75 mg p.o. daily.  2. Aspirin 325 mg p.o. daily.  3. Coreg 25 mg p.o. b.i.d.  4. Lisinopril/hydrochlorothiazide 20/12.5 mg p.o. b.i.d.  5. Imdur  60 mg p.o. b.i.d.  6. Simvastatin 80 mg p.o. daily.  7. Niaspan 500 mg 2 tablets p.o. nightly.  8. Celexa 40 mg p.o. daily.  9. Xanax 1 mg p.o. q.i.d.  10.Advair 250/50 one puff b.i.d.  11.Flonase 2 puffs to each nostril daily.  12.Vitamin D 50,000 units weekly.  13.P.r.n. sublingual nitroglycerin spray.  14.Ultram.  15.Albuterol inhaler.   REVIEW OF SYSTEMS:  As outlined above.  Otherwise negative.   PHYSICAL EXAMINATION:  VITAL SIGNS:  Blood pressure was elevated today  at 162/98, heart rate 54, weight is 268 pounds which is up 3 pounds from  his last visit.  GENERAL:  This is an obese male in no acute distress.  No active chest  pain.  NECK:  No elevated jugular venous pressure or loud carotid bruits.  LUNGS:  Diminished breath sounds.  Nonlabored breathing.  No wheezing  noted.  CARDIAC:  Regular rate and rhythm.  No S3 gallop or  pericardial rub.  ABDOMEN:  Soft, nontender.  EXTREMITIES:  No significant pitting edema.   Myoview from April 2009 demonstrated no diagnostic electrocardiographic  changes.  There was abnormal myocardial perfusion with a fairly large  fixed defect in the anteroseptal distribution consistent with scar and  also a medium fixed defect in the apical to mid inferior segment  suggestive of scar.  Ejection fraction was 33% at that time, although  better by concurrent echocardiography at 45-50%.   IMPRESSION AND RECOMMENDATIONS:  Coronary artery disease status post  previous anterior wall myocardial infarction treated with drug-eluting  stent placement to the left anterior descending in 2003.  This was  followed subsequently by acute in-stent thrombosis in February 2008,  which was managed with angioplasty.  At that time, 70-75% residual  stenoses were noted in the right coronary artery and these were  ultimately managed medically after recurrent anaphylactic reaction to  contrast dye was documented.  Most recent left ventricular ejection  fraction was 45-50% with regional wall motion abnormalities consistent  with infarct and a Cardiolite study done in April 2009 showed scar  without ischemia.  Despite aggressive medical therapy, Mr. Dasari  continues to have progressive chest pain symptoms particularly over the  last few weeks.  His electrocardiogram shows relatively stable changes  in the anterior and inferior leads.  He unfortunately continues to smoke  cigarettes despite discussion of smoking cessation with him on numerous  occasions, although has cut back to one half-pack per day.  He is very  worried about his symptoms and would like to discuss an attempt at  percutaneous intervention.  I discussed with him the continued risk for  recurrent anaphylactic reaction to contrast dye and even death, despite  the negative allergy test to Isovue 300.  Our plan will be for Mr.  Burgers to see  Dr. Burt Knack this week to discuss the situation in further  detail and arrive at a plan.  I will forward the allergy testing  consultation with recommendations to Dr.  Burt Knack for his review as well.  Mr. Mulloy will continue medical therapy  for now.  A refill was given for sublingual nitroglycerin spray.     Satira Sark, MD  Electronically Signed    SGM/MedQ  DD: 06/20/2008  DT: 06/21/2008  Job #: [id number]   cc:   Imagene Sheller. Burt Knack, MD

## 2010-11-27 NOTE — Assessment & Plan Note (Signed)
OFFICE VISIT   [name], [name]  DOB:  15-May-1966                                        August 29, 2008  CHART #:  [id number]   The patient is status post coronary artery bypass grafting x5 done by  Dr. Cyndia Bent on July 29, 2008.  The patient's postoperative course was  pretty much unremarkable.  He was discharged to home in stable condition  on August 04, 2008.  He presents today for a 3-week followup visit.  The patient states he is progressing well.  He still has some incisional  pain and some neck and shoulder muscle tightness.  He has seen Dr.  Domenic Polite, which he follows back up with on March 8th.  Dr. Domenic Polite did  place the patient back on Plavix.  The patient has been in touch with  cardiac rehabilitation and plans to proceed on one given day.  He is  ambulating on his own.  He denies any shortness of breath, nausea,  vomiting, drainage or opening from any of his incision sites.  He does  admit that he has smoked 2 cigarettes since he has been discharged from  the hospital, but states they did not taste well and is continuing to  quit completely.  The patient states his diet is improving.   PHYSICAL EXAMINATION:  VITAL SIGNS:  Blood pressure 145/92, pulse 106,  respirations of 18, and O2 sats 97% on room air.  RESPIRATORY:  Clear to auscultation bilaterally.  CARDIAC:  Regular rate and rhythm.  ABDOMEN:  Soft, nontender.  EXTREMITIES:  No edema noted.  INCISIONS:  All incisions are clean, dry, and intact and healing well.   STUDIES:  The patient had PA and lateral chest x-ray done today on  August 29, 2008, which is stable.  Small effusion noted in left side.  Wires intact.   IMPRESSION AND PLAN:  The patient is status post coronary artery bypass  grafting.  He is progressing well.  The patient plans to follow up with  Dr. Domenic Polite on September 19, 2008.  He is again encouraged and educated on  importance of quitting smoking.  He is to  continue on all current  medications.  The patient is told he is allowed to drive.  He is told  still no heavy lifting over 10 pounds for another month.  At this time,  he is told to slowly increase his weight allowance.  The patient is  again encouraged to plan for cardiac rehabilitation.  We will release  the patient from the office and he is told to followup p.r.n.  If he has  any questions or concerns, he is to contact us.   Gilford Raid, M.D.  Electronically Signed   KMD/MEDQ  D:  08/29/2008  T:  08/29/2008  Job:  [id number]   cc:   Gilford Raid, M.D.  Satira Sark, MD  Dr. Quillian Quince

## 2010-11-27 NOTE — Op Note (Signed)
[name], [name]          ACCOUNT NO.:  [id number]   MEDICAL RECORD NO.:  [id number]          PATIENT TYPE:  INP   LOCATION:  2311                         FACILITY:  [facility]   PHYSICIAN:  Gilford Raid, M.D.     DATE OF BIRTH:  May 03, 1966   DATE OF PROCEDURE:  DATE OF DISCHARGE:                               OPERATIVE REPORT   PREOPERATIVE DIAGNOSIS:  Severe three-vessel coronary artery disease  with stable angina.   POSTOPERATIVE DIAGNOSIS:  Severe three-vessel coronary artery disease  with stable angina.   OPERATIVE PROCEDURE:  Median sternotomy, extracorporeal circulation,  coronary artery bypass graft surgery x5 using a left internal mammary  artery graft to left anterior descending coronary artery, with the  saphenous vein graft to the diagonal branch of the LAD, a sequential  saphenous vein graft to the first and second obtuse marginal branches of  the left circumflex coronary artery, and a saphenous vein graft to the  distal right coronary artery.  Endoscopic vein harvesting from both  legs.   ATTENDING SURGEON:  Gilford Raid, MD   ASSISTANT:  Lanelle Bal, MD.   SECOND ASSISTANT:  Suzzanne Cloud, PA   CLINICAL HISTORY:  This patient is a 45 year old gentleman with multiple  cardiac risk factors including diabetes, hypertension, hyperlipidemia, a  history of heavy smoking, a strong family history of heart disease at a  young age who himself suffered an anterior MI in 2003.  He underwent  stenting of the proximal LAD.  He subsequently had recurrent myocardial  infarction secondary to stent thrombosis and was treated with balloon  angioplasty in that area.  He also had moderate to severe stenosis in  the right coronary artery and was managed inadequately.  The patient  continued having anginal symptoms and presented for a reload  catheterization, but had anaphylactic reaction to contrast media and  required hemodynamic support but recovered.  He had a second  attempt  with premedication, but the same reaction.  He recently presented with  complaints of exertional and rest substernal chest pain associated with  shortness of breath.  He underwent repeat catheterization on July 21, 2008, under general anesthesia, which showed the LAD to be occluded in  the area of the previous stent.  There were collaterals to the distal  LAD from the posterior descending artery.  There was a large diagonal  branch that has 50% to 70% ostial stenosis.  Left circumflex gave off a  large intermediate and first marginal, had 40% to 50% proximal stenosis.  There was a second marginal branch that had 50% to 70% stenosis.  The  right coronary was diffusely diseased with long segment 70% to 75%  stenosis involving the proximal vessel.  Further down, the vessel had  long 50% stenosis that supplied the posterior descending branch.  The  posterior descending filled the LAD by collaterals.  Left ventricular  ejection fraction was 45% to 50% with no mitral regurgitation.  There  was akinesis of the apex and inferior apex.  There was no gradient  across the aortic valve and no mitral regurgitation.  After review of  the catheterization  and examination, the patient was felt that coronary  bypass graft surgery was the best treatment to prevent further ischemia  infarction and improve his quality of life.  I discussed the operative  procedure with the patient in the office.  We discussed alternatives,  benefits, and risks including, but not limited to bleeding, blood  transfusion, infection, stroke, myocardial infarction, graft failure,  and death.  We also discussed the importance of maximum cardiac risk  factor reduction including complete smoking cessation, control of his  diabetes, hypertension, and hyperlipidemia as well as physical activity  and weight loss.  He understands all this and agrees to proceed with  surgery.   OPERATIVE PROCEDURE:  The patient was taken to  the operating room and  placed on the table in supine position.  After induction of general  endotracheal anesthesia, a Foley catheter was placed in the bladder  using sterile technique.  Then the chest, abdomen, and both lower  extremities were prepped and draped in usual sterile manner.  The chest  was entered through a median sternotomy incision.  The pericardium was  opened in the midline.  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   Then, the left internal mammary artery was harvested from the chest wall  as pedicle graft.  This was a large caliber vessel with excellent blood  flow through it.  At the same time, a segment of greater saphenous vein  was harvested from the left leg using endoscopic vein harvest technique.  This vein was of medium size and good quality.  It was also necessary to  harvest a segment from the right thigh, which was done endoscopically.  The vein in the right lower leg was not harvested.  All the vein used  was of medium size and good quality.   Then, the pericardial adhesions were dissected to expose the right  atrium and ascending aorta.  The aorta was of relatively small caliber,  considering his body surface area, but had no palpable plaques in it.  The patient was then heparinized when an adequate activated clotting  time was achieved, the distal ascending aorta was cannulated using a 20-  [place] aortic cannula for arterial inflow.  Venous outflow was achieved  using a two-stage venous cannula through the right atrial appendage.  An  antegrade cardioplegia and neck cannula was inserted in the aortic root.  A retrograde cannula was placed through the right atrium and in the  coronary sinus.   The patient placed on cardiopulmonary bypass and the remainder of the  pericardial adhesions were divided.  These were quite dense and took a  considerable amount of time to do this.  Examination of the distal   coronary arteries showed that the LAD was a medium-sized vessel that was  diffusely diseased with thickening of the wall.  The first diagonal  branch was a medium-size graftable vessel that was lying deep in the  epicardial fat.  The two marginal branches were both completely  intramyocardial, but were localized and dissected from the muscle.  The  first marginal was a large vessel that had no significant distal disease  in it.  The second was somewhat smaller, but graftable with no distal  disease seen.  The right coronary artery was diffusely diseased.  The  posterior descending branch was lying immediately beneath a large  posterior descending vein.  Therefore, it was necessary to graft the  distal right coronary artery just before  the takeoff of the posterior  descending branch.  There was some moderate degree of plaque in this  area, but the vessel was suitable for grafting.   Then, the aorta was cross-clamped and 500 mL of cold blood antegrade  cardioplegia was administered in the aortic root with quick arrest of  the heart.  This was followed by 500 mL of cold blood retrograde  cardioplegia.  Systemic hypothermia to 28 degrees centigrade and topical  hypothermic iced saline was used.  Temperature probe was placed in the  septum insulating pad in the pericardium.   Then, the first distal anastomosis was performed to the distal right  coronary artery.  The internal diameter of this vessel was about 2 mm.  The conduit used was a segment of a greater saphenous vein and the  anastomosis performed in an end-to-side manner using continuous 7-0  Prolene suture.  Flow was noted through the graft and was excellent.   Second distal anastomosis was performed to the first marginal branch.  The internal diameter of this vessel was about 2 mm.  The conduit used  was a second segment of greater saphenous vein and the anastomosis  performed in sequential side-to-side manner using continuous 7-0  Prolene  suture.  Flow was noted through the graft and was excellent.   Third distal anastomosis was performed in the second marginal branch.  The internal diameter was about 1.75 mm.  The conduit used was the same  segment of greater saphenous vein and the anastomosis performed in a  sequential end-to-side manner using a continuous 7-0 Prolene suture.  Flow was noted through the graft and was excellent.  Then, another dose  of cardioplegia was given down the vein grafts as well as in a  retrograde manner.   The fourth distal anastomosis was performed in the diagonal branch.  The  internal diameter was 1.6 mm.  The conduit used was a third segment of  greater saphenous vein and the anastomosis performed in an end-to-side  manner using continuous 7-0 Prolene suture.  Flow was noted through the  graft and was excellent.   The fifth distal anastomosis was performed to the mid portion of the  left anterior descending coronary artery.  The internal diameter here  was about 1.6 mm.  The conduit used was a left internal mammary graft  and was brought through an opening in the left pericardium anterior of  the phrenic nerve.  It was anastomosed to the LAD in an end-to-side  manner using continuous 8-0 Prolene suture.  The pedicle was sutured to  the epicardium with 6-0 Prolene sutures.  Then another dose of  cardioplegia given in antegrade and retrograde.   Then, with the cross-clamp in place, the three proximal vein graft  anastomoses were performed to the aortic root in end-to-side manner  using continuous 6-0 Prolene suture.  The clamp was removed from mammary  pedicle.  There was rapid warming of ventricular septum and return of  spontaneous ventricular fibrillation.  The cross-clamp was removed at  the time 123 minutes and the patient spontaneously converted to sinus  rhythm.  Proximal and distal anastomoses appeared hemostatic and allowed  the grafts to satisfactory.  Graft markers  placed around the proximal  anastomoses.  Two temporary right ventricular and right atrial pacing  wires placed above the skin.   The patient rewarmed to 37 degrees centigrade.  He was weaned from  cardiopulmonary bypass on a low-dose dopamine.  Total bypass time was  156 minutes.  Cardiac output appeared excellent at 8 L per minute.  Protamine was given and venous and aortic cannulas were removed without  difficulty.  Hemostasis was achieved.  Three chest tubes were placed  with 2 in the post pericardium, 1 in the left pleural space, and 1 in  the anterior mediastinum.  The sternum was then closed with double #6  stainless steel wires.  The fascia was closed with continuous #1 Vicryl  suture.  Subcutaneous tissue was closed with continuous 2-0 Vicryl and  the skin layers with a 3-0 Vicryl subcuticular closure.  The lower  extremity vein harvest site was closed in layers as a similar manner.  Sponge, needles and instrument counts were correct according to the  scrub nurse.  Dry sterile dressings were applied over the incisions  around the chest tubes with Pleur-Evac suction.  The patient remained  hemodynamically stable and was transferred to the SICU in guarded, but  stable condition.   In conclusion, I would doubt that this gentleman would be suitable for  redo coronary artery bypass surgery.  His two marginal branches were  both deep intramyocardial and nearly impossible to expose under redo  surgery conditions, given the degree of difficulty in bypassing these  arteries at his initial surgery.  The LAD was diffusely diseased and  thickened and I doubt would be suitable for grafting again.  The right  coronary artery was also diffusely diseased and the posterior descending  branch was lying deep beneath a large posterior descending vein that was  not exposed at all.       Gilford Raid, M.D.  Electronically Signed     BB/MEDQ  D:  07/29/2008  T:  07/30/2008  Job:  [id number]    cc:   Juanda Bond. Burt Knack, MD

## 2010-11-27 NOTE — Assessment & Plan Note (Signed)
[facility] OFFICE NOTE   NAME:Nunes, TAFF LEET                 MRN:          [id number]  DATE:03/08/2008                            DOB:          July 20, 1965    PRIMARY CARE PHYSICIAN:  Dr. Gar Ponto   REASON FOR VISIT:  Routine followup.   HISTORY OF PRESENT ILLNESS:  I saw Mr. Kulczyk back in May.  His history  is well detailed in the previous note from Dec 03, 2007.  He reports  occasional chest pain, more frequent than he had been having in the  past, although no prolonged rest symptoms.  He reports compliance with  his medications including dual antiplatelet therapy.  He has not yet  quit smoking.  We talked about this again today and the critical  importance to him of smoking cessation.  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  We also discussed rationale for percutaneous  intervention and use of stents.  My position has been to avoid any  elective interventions in this gentleman, despite the fact that he did  have allergy testing to Isovue-300 which was felt to be a potential  agent that could be used for him as he did not have a direct reaction at  that time.  This is however not fool proof and he could potentially  manifest a major reaction even in light of his allergy testing and  appropriate pre-treatment.  The cardiac cath lab director at [facility] has been informed of this information and I have been assured  that there is Isovue-300 available for the patient if he were to present  with an acute coronary syndrome that required an attempt at urgent  revascularization.  Otherwise, my suggestion would be continued medical  therapy.  He voices and understanding of this and agreed.   ALLERGIES:  ALEVE and anaphylactic IgE mediated response to CONTRAST DYE  as detailed previously.   PRESENT  MEDICATIONS:  1. Coreg 12.5 mg p.o. b.i.d.  2. Imdur 60 mg p.o. q.a.m. and 30 mg p.o. q.p.m.  3. Vytorin 10/80 mg p.o. daily.  4. Alprazolam 1 mg p.o. q.i.d.  5. Plavix 75 mg p.o. daily.  6. Niaspan 500 mg p.o. b.i.d.  7. Aspirin 325 mg p.o. daily.  8. Albuterol 2 puffs p.r.n.  9. Advair 250/50 one puff b.i.d.  10.WelChol 3 tablets twice p.o. b.i.d.  11.Nitroglycerin 0.4 mg p.r.n.  12.Flonase p.r.n.  13.Ultram p.r.n.   REVIEW OF SYSTEMS:  As described in history of present illness.  No  palpitations or syncope.  Otherwise negative.   PHYSICAL EXAMINATION:  VITAL SIGNS:  Blood pressure is 160/91, heart  rate is 90, weight is 265 pounds, which is stable.  GENERAL:  This is an obese male in no acute distress.  No active chest  pain.  NECK:  Reveals no elevated jugular venous pressure.  No loud carotid  bruits.  LUNGS:  Clear without labored breathing at rest.  Diminished breath  sounds throughout.  CARDIAC:  Reveals a  regular rate and rhythm.  No S3 or gallop.  No  pericardial rub.  EXTREMITIES:  Exhibit no significant pitting edema.   IMPRESSION AND RECOMMENDATIONS:  1. Coronary artery disease status post previous anterior wall      myocardial infarction treated with drug-eluting stent placement to      the left anterior descending in 2003.  Subsequent acute in-stent      thrombosis occurred in February 2008, managed with angioplasty.  At      that time, the patient had residual 70% and 75% stenoses within the      right coronary artery that were managed medically after a recurrent      anaphylactic reaction that occurred, and was worse the second time.      Most recently, a Cardiolite study was performed back in April,      which demonstrated evidence of previous anterior wall infarct as      well as a moderate-sized inferior defect suggesting possibly scar      in that distribution raising the question of whether he may have      had an infarct in the inferior wall sometime  in the interim.  There      was no frank evidence of ischemia on the study.  Ejection fraction      by echocardiography was 45-50% with inferior hypokinesis and      anterior hypokinesis.  Medical therapy is detailed above.  I did      speak with him about increasing his Imdur to 60 mg twice daily.      His blood pressure was elevated and he was previously on      lisinopril.  It may be that placing him on Norvasc for his      antihypertensive and antianginal effects would be a good addition,      possibly uptitrating Coreg as he tolerates.  I still feel that      medical therapy is the best option at this point and would defer an      elective angioplasty/stent for management essentially of symptoms      given his risk of an anaphylactic reaction to contrast, even in      light of his allergy testing.  I have discussed this with him on      several occasions and again today.  He remains in agreement.  Also      critical in his management is smoking cessation, which I have      reviewed with him exhaustively.  He states he will continue to work      on this.  2. Dyslipidemia, on WelChol, Vytorin, and Niaspan.  He has a followup      with Dr. Quillian Quince soon and is to undergo further medicine      adjustments.     Satira Sark, MD  Electronically Signed    SGM/MedQ  DD: 03/08/2008  DT: 03/09/2008  Job #: [id number]   cc:   Gar Ponto

## 2010-11-27 NOTE — Consult Note (Signed)
NEW PATIENT CONSULTATION   [name], [name]  DOB:  May 21, 1966                                        July 26, 2008  CHART #:  [id number]   REASON FOR CONSULTATION:  Severe 3-vessel coronary artery disease.   CLINICAL HISTORY:  I was asked by Dr. Burt Knack to evaluate the patient for  consideration of coronary artery bypass graft surgery.  He is a 45-year-  old gentleman with multiple cardiac risk factors including diabetes,  hypertension, hyperlipidemia, history of heavy smoking, and a strong  family history of heart disease at a young age who himself suffered an  anterior MI in 2003.  He underwent stenting of the proximal LAD.  He  subsequently had a recurrent myocardial infarction secondary to stent  thrombosis and was treated with balloon angioplasty in that area.  He  also had moderate-to-severe stenosis in the right coronary artery and  was managed medically.  He continued to have anginal symptoms and  presented for a relook catheterization, but had an anaphylactic reaction  to contrast media and required hemodynamic support, but recovered.  He  had a second attempt at percutaneous intervention because of recurrent  symptoms and was premedicated with steroids, H2 blockers, and  antihistamines, but developed recurrent reaction with marked shortness  of breath and hypotension.  He was supported in the ICU, but recovered.  He subsequently underwent referral to [facility] to the Department  of Allergy and Immunology and was tested for alternative contrast  agents.  He has been complaining of exertional and rest substernal chest  pain associated with shortness of breath.  He said that this occurs at  least every other day.  It is resolved after using sublingual  nitroglycerin and aspirin.  He subsequently underwent repeat  catheterization on 07/21/2008.  The procedure was done under general  anesthesia due to his previous problems with allergic  reactions and  airway obstruction.  Catheterization showed the LAD to be occluded in  the area of the previous stent.  There were collaterals to the distal  LAD from the posterior descending artery.  There is a large diagonal  branch that has 50-70% ostial stenosis.  The left circumflex artery gave  off a large intermediate branch and had 40-50% proximal stenosis.  There  was a first obtuse marginal branch that had 50-70% stenosis.  The right  coronary artery was diffusely diseased with a long segment 70-75%  stenosis involving the proximal vessel.  The mid vessel further down had  a long 50% stenosis and supplied the posterior descending branch.  The  posterior descending filled the LAD by collaterals.  Left ventricular  ejection fraction was 45-50% with no mitral regurgitation.  There was  akinesis of the apex and inferior apex.  There is no gradient across the  aortic valve and no mitral regurgitation.   REVIEW OF SYSTEMS:  General:  He denies any fever or chills.  He has had  weight gain.  He reports loss of appetite.  He has had fatigue.  Eyes:  He reports some decreased vision, but cannot be specific.  He is  edentulous.  Endocrine:  He has adult-onset diabetes since age 67.  He  denies hypothyroidism.  Cardiovascular:  As above.  He has had  exertional and rest substernal chest pain and pressure.  He  has  exertional dyspnea.  He has had PND and orthopnea.  He denies peripheral  edema.  Respiratory:  He reports some cough and wheezing.  He does have  history of bronchitis.  GI:  He has had no nausea or vomiting.  He does  report some trouble swallowing at times.  He denies melena and bright  red blood per rectum.  GU:  He denies dysuria and hematuria.  Vascular:  He denies claudication, but does have some nonspecific pains in his legs  with walking as well as when lying down.  He has never had a DVT or  phlebitis.  Neurological:  He reports some headaches and dizziness.  He  has  never had TIA or stroke.  He denies any focal weakness or numbness.  Musculoskeletal:  He reports arthralgias and joint pains.  Psychiatric:  He reports history of nervousness.  Hematological:  He denies any  history bleeding disorders or easy bleeding.   ALLERGIES:  To contrast media, which causes an anaphylactic reaction and  Aleve, which also causes swelling and hives.   MEDICATIONS:  Plavix 75 mg daily, which has been stopped since his  catheterization.  He is on aspirin 325 mg daily, Coreg 25 mg b.i.d.,  lisinopril/HCTZ 20/12.5 mg b.i.d., Imdur 60 mg b.i.d., simvastatin 80 mg  daily, Celexa 40 mg daily, Xanax 1 mg q.i.d., Advair 250/50 b.i.d.,  Flonase 2 squirts each stool daily, vitamin C 50,000 units weekly,  sublingual nitroglycerin spray p.r.n., Ultram 50 mg q.a.c. p.r.n. for  pain, Niaspan 1000 mg nightly, Atrovent and Ventolin nebulizer treatment  p.r.n., and Lidex 0.05% ointment b.i.d. p.r.n. for eczema.   PAST MEDICAL HISTORY:  Significant for diabetes, hypertension,  hyperlipidemia, history of myocardial infarction in 2003 and 2008.  He  has a history of COPD.  He is status post tonsillectomy and status post  cholecystectomy.  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   FAMILY HISTORY:  Strongly positive for cardiac disease at a young age.  He has a brother who is in his 33s and needs coronary artery bypass  surgery.  His mother and father both have coronary artery disease.   SOCIAL HISTORY:  He is married and has 2 small children.  He is disabled  due to his diabetes and heart disease.  His wife is a Immunologist.  He smokes up to 1-1/2 pack of cigarettes per day.  He denies  alcohol use.   PHYSICAL EXAMINATION:  VITAL SIGNS:  His blood pressure is 153/100 and  his pulse is 97 and regular.  Respiratory rate is 18 and unlabored.  Oxygen saturation on room air is 96%.  GENERAL:  He is an obese white male, in no distress.  HEENT:  To be  normocephalic and atraumatic.  Pupils are equal and react  to light and accommodation.  Extraocular muscles are intact.  His throat  is clear.  He is edentulous.  NECK:  Normal carotid pulses bilaterally.  There are no bruits.  There  is no adenopathy or thyromegaly.  CARDIAC:  Regular rate and rhythm with normal S1 and S2.  There is no  murmur, rub, or gallop.  LUNGS:  Clear.  ABDOMEN:  Active bowel sounds.  His abdomen is soft, obese, and  nontender.  There are no palpable masses or organomegaly.  EXTREMITIES:  No peripheral edema.  Pedal pulses are palpable bilaterally.  SKIN:  Warm and dry.  NEUROLOGIC:  Be alert  and oriented x3.  Motor and sensory exams are  grossly normal.   Carotid Doppler examination shows no evidence of internal carotid artery  stenosis.   IMPRESSION:  The patient has severe 3-vessel coronary artery disease  with persistent anginal symptoms with minimal exertion and at rest.  I  agree that coronary artery bypass graft surgery is the best treatment to  improve his quality of life and to prevent further ischemia and  infarction.  I discussed the operative procedure with him including  alternatives, benefits, and risks including, but not limited to  bleeding, blood transfusion, infection, stroke, myocardial infarction,  graft failure, organ failure, and death.  Also, discussed the importance  of maximum cardiac risk factor reduction including complete smoking  cessation, control of his diabetes, hypertension, hyperlipidemia as well  as physical activity and weight loss.  He understands all this and  agrees to proceed with surgery.  We will plan to do this on Friday  07/29/2008.   Gilford Raid, M.D.  Electronically Signed   BB/MEDQ  D:  07/26/2008  T:  07/26/2008  Job:  [id number]   cc:   [place]

## 2010-11-27 NOTE — Discharge Summary (Signed)
NAMEHERLIN, CROPLEY          ACCOUNT NO.:  [id number]   MEDICAL RECORD NO.:  [id number]          PATIENT TYPE:  INP   LOCATION:  2008                         FACILITY:  [facility]   PHYSICIAN:  Gilford Raid, M.D.     DATE OF BIRTH:  Apr 27, 1966   DATE OF ADMISSION:  07/29/2008  DATE OF DISCHARGE:  08/04/2008                               DISCHARGE SUMMARY   FINAL DIAGNOSIS:  Severe 3-vessel coronary artery disease with stable  angina.   IN-HOSPITAL DIAGNOSES:  1. Volume overload postoperatively.  2. Postoperative hypoxemia, secondary to pulmonary edema.   SECONDARY DIAGNOSES:  1. Diabetes.  2. Hypertension.  3. Hyperlipidemia.  4. History of myocardial infarction, 2003 and 2008.  5. History of chronic obstructive pulmonary disease.  6. Status post tonsillectomy.  7. Status post cholecystectomy.  8. 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   IN-HOSPITAL OPERATIONS AND PROCEDURES:  Coronary artery bypass grafting  x5 using a left internal mammary artery graft to left anterior  descending coronary artery, saphenous vein graft to diagonal branch of  the LAD, sequential saphenous vein graft to first and second obtuse  marginal branches, saphenous vein graft to distal right coronary artery.  Endoscopic vein harvesting from bilateral leg done.   HISTORY AND PHYSICAL AND HOSPITAL COURSE:  The patient is a 45 year old  gentleman with multiple cardiac risk factors including diabetes,  hypertension, hyperlipidemia, history of heavy smoking, strong family  history of heart disease at young age who himself suffered an anterior  MI in 2003.  He underwent stenting of the proximal LAD at that time.  He  subsequently had recurrent myocardial infarction secondary to stent  thrombosis and history of balloon angioplasty in that area.  The patient  continued to have anginal symptoms and presented with repeat  catheterization, had an anaphylactic reaction to the contrast media,  required  hemodynamic support for recovery.  He again underwent cardiac  catheterization, but was premedicated at this time, this was done on  July 21, 2008.  This showed severe 3-vessel coronary artery disease  with reocclusion of the LAD in the area of the previous stent.  Following catheterization, Dr. Cyndia Bent was consulted.  Dr. Cyndia Bent saw and  evaluated the patient.  He discussed with the patient of undergoing  coronary artery bypass grafting.  He discussed risks and benefits with  the patient.  The patient nods understanding and agreed to proceed.  Surgery was scheduled for July 29, 2008.   For details of the patient's past medical history and physical exam,  please see dictated H and P.   The patient was taken to the operating room on July 29, 2008, where  he underwent coronary artery bypass grafting x5 using a left internal  mammary artery graft to left anterior descending coronary artery,  saphenous vein graft to diagonal branch of the LAD, sequential saphenous  vein graft to first and second obtuse marginal branches, saphenous vein  graft to distal right coronary artery.  Endoscopic vein harvesting from  right leg was done.  The patient tolerated this procedure and was  transferred to the intensive care unit in stable  condition.  Postoperatively, the patient was noted to be hemodynamically stable.  He  was extubated evening of surgery.  Post extubation, the patient was to  be alert and oriented x4.  Neuro intact.  Postoperatively, the patient's  blood pressure and heart rate noted to be stable.  He has weaned and  from all inotropic drips of 9 mL.  Follow up chest x-ray postop day #1  showed mild edema.  He had mild drainage from the chest tubes and chest  tubes were discontinued without difficulty.  He was noted to be in  normal sinus rhythm.  The patient was up ambulating well at the cardiac  rehab.  He did have some volume overload and was started on diuretics.  The patient  was felt to be stable and ready for transfer out to PCT on  postop day 2.  On postop day #3, the patient noted to be slightly  lethargic in the a.m.  He was noted to be remaining in normal sinus  rhythm.  Blood pressure is stable.  Chest x-ray done at this time showed  poor aeration, but had some improvement from previous x-ray.  At this  time, the patient was continued on diuretics and management.  Later that  day, the patient was discharged with his O2 sats dropping to 78% on 3 L  nasal cannula.  The On-call was consulted.  The patient eventually was  placed on 50% face mask, which brought O2 sats up into the high 80s.  He  was then transferred back to the SICU due to respiratory distress.  Chest x-ray was done, which showed bilateral airspace disease and  pulmonary edema.  Tressie Ellis was started empirically and diuretics were  increased.  Following day, the patient's face mask was able to be  decreased to 40%.  He was sating in the 90s.  Pulmonary status had  improved.  Chest x-ray, which showed improving aeration and decreasing  edema.  He was continued on diuretics as well as Higher education careers adviser and  nebulizer/inhalers.  By postop day #6, chest x-ray showed continued to  improve aeration and edema.  He was able to be weaned off oxygen with O2  sats greater than 90%.  The patient continued to use his incentive  spirometer.  We will plan to continue nebulizers and inhalers at home.  Postoperatively, the patient remained in normal sinus rhythm.  Blood  pressure remained stable.  He was able to be restarted on his Coreg.  Blood pressure was tolerated well.  The patient did develop leukocytosis  postoperatively, but was afebrile.  He had been started on [place]  empirically for the air space disease.  The patient was noted to have a  low-grade temperature on postop day #5.   His white blood count was monitored and started to trend down by postop  day 6, it was 15.8.  The patient was afebrile.  Tressie Ellis was  discontinued  at this time.  The patient had no signs of superficial wound infection.  Postoperatively, the patient was continued on diuretics for volume  overload.  This had improved prior to discharge home and Lasix was  discontinued prior to discharge.  The patient was up ambulating with  cardiac rehab.  It did require encouragement for the patient.  Prior to  discharge home, the patient is up ambulating on his own in the hall  without difficulty.  Now, the patient's incisions were clean, dry, and  intact and healing well.  Prior to discharge,  the patient was tolerating  diet.  No nausea or vomiting noted.   The patient was felt to be progressing well and stable on postop day 6.  He was noted to be afebrile.  Blood pressure and heart rate is stable.  He is in normal sinus rhythm.  He is sating greater than 90% on room  air.  Blood sugars stable.  The patient currently on no diabetic  medication.   Last lab work showed sodium of 136, potassium of 3.5, chloride of 96,  bicarb of 29, BUN of 29, creatinine of 1.06, and glucose of 117.  White  blood cell count 15.8, which has been decreased since day prior.  Hemoglobin of 1.9, hematocrit of 35.9, and platelet count 280.  The  patient is felt to be stable and ready for discharge to home today,  postop day #6, August 04, 2008.   FOLLOWUP APPOINTMENTS:  A follow up appointment has been arranged with  Dr. Vivi Martens PA for August 29, 2008, at 1 p.m.  The patient will need  to obtain PA and lateral chest x-ray, 30 minutes prior to this  appointment.  The patient will need to follow up Dr. Burt Knack in 2 weeks.  He will need to contact Dr. Antionette Char office to make these arrangements.   ACTIVITY:  The patient was instructed no driving until released to do so  and no lifting over 10 pounds.  He is told to ambulate 3-4 times per  day, progress as tolerated and continue his breathing exercises.   INCISIONAL CARE:  The patient is told to shower  washing his incisions  using soap water.  He is to contact the office if he develops any  drainage or opening from the incision sites.   DIET:  The patient was educated on diet to be low-fat, low-salt, and  diabetic diet.   DISCHARGE MEDICATIONS:  1. Aspirin 325 mg daily.  2. Ultram 50 mg 1-2 tabs q.4-6 h. p.r.n.  3. Zocor 80 mg at night.  4. Niaspan 500 mg at night.  5. Celexa 40 mg daily.  6. Xanax 1 mg 4 times per day p.r.n.  7. Advair 250/50 one puff b.i.d.  8. Atrovent and Ventolin nebulizer q.4 h. p.r.n.  9. Lidex 0.05% ointment b.i.d. p.r.n.  10.Flonase daily.  11.Vitamin D 50,000 units weekly.  12.Combivent 2 puffs q.i.d.  13.Coreg 12.5 mg b.i.d.  14.Multivitamin daily.  15.Oxycodone 5 mg 1-2 tabs q.4-6 h. p.r.n.      Darlin Coco, [place]      Gilford Raid, M.D.  Electronically Signed    KMD/MEDQ  D:  08/04/2008  T:  08/05/2008  Job:  [id number]   cc:   Juanda Bond. Burt Knack, MD

## 2010-11-27 NOTE — H&P (Signed)
NAME:  [name], [name]          ACCOUNT NO.:  [id number]   MEDICAL RECORD NO.:  [id number]          PATIENT TYPE:  AMB   LOCATION:  DAY                           FACILITY:  [facility]   PHYSICIAN:  Marissa Nestle, M.D.DATE OF BIRTH:  02-07-1966   DATE OF ADMISSION:  DATE OF DISCHARGE:  LH                              HISTORY & PHYSICAL   CHIEF COMPLAINT:  Left renal colic.   A 45 year old gentleman is having pain in his left flank.  He has a 5 mm  stone in the lower pole calyx.  I have told him that we can treat it and  see if we can get it.  There are no guarantees about the results.  He  wanted me to go ahead and proceed, so he is brought as an outpatient to  undergo ESL and left renal calculus.   Past history includes non-insulin-dependent diabetes, hypertension,  sleep apnea, obesity.  He has angina.   Personally, he does smoke.  He does not drink alcohol.   REVIEW OF SYSTEMS:  Otherwise unremarkable.   He does have a history of having myocardial infarction, cardiac  catheterization.   PHYSICAL EXAMINATION:  Moderately obese.  Not in acute distress.  Blood pressure 160/90, temperature 98.4.  CENTRAL NERVOUS SYSTEM:  Negative.  HEENT:  Negative.  CHEST:  Symmetrical.  HEART:  Regular sinus rhythm.  No murmur.  ABDOMEN:  Soft.  Flat.  Liver, spleen, kidney not palpable.  No CVA  tenderness.  EXTERNAL GENITALIA:  Unremarkable.   IMPRESSION:  1. Left renal calculus.  2. Coronary artery disease.  3. Hypertension.  4. Diabetes.   PLAN:  ESL of renal calculus.      Marissa Nestle, M.D.  Electronically Signed     MIJ/MEDQ  D:  03/13/2007  T:  03/13/2007  Job:  [id number]

## 2010-11-30 NOTE — H&P (Signed)
[name], [name]          ACCOUNT NO.:  [id number]   MEDICAL RECORD NO.:  [id number]          PATIENT TYPE:  INP   LOCATION:  2907                         FACILITY:  [facility]   PHYSICIAN:  Juanda Bond. Burt Knack, MD  DATE OF BIRTH:  1965/09/29   DATE OF ADMISSION:  09/04/2006  DATE OF DISCHARGE:                              HISTORY & PHYSICAL   PRIMARY CARDIOLOGIST:  Satira Sark, MD in [place].   PRIMARY CARDIOLOGIST:  Gar Ponto, M.D.   PATIENT PROFILE:  A 45 year old married, Caucasian male with prior  history of CAD status post Cypher eluting stent placement to the LAD in  October 2003 who presents with acute anterior ST segment elevation  myocardial infarction.   PROBLEM:  1. Acute anterior ST elevation MI/coronary artery disease      a.     April 22, 2002, PCI and stenting of the LAD with placement       of 3.5 x 18 mm Cipher drug eluting stent.      b.     August 19, 2005, cardiac catheterization.  Left main       normal.  LAD 30% in stent.  V-1 40%.  Proximal left circumflex       normal.  OM1 30%, OM2 50%.  RCA 50-60% proximal, 50% distal.  2. Hypertension.  3. Hyperlipidemia.  4. Type 2 diabetes mellitus.  5. Obstructive sleep apnea.  6. Tobacco abuse.  7. Peridental disease status post multiple tooth extractions.  8. Tonsillectomy and adenoidectomy.  9. Status post cholecystectomy.  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   HISTORY OF PRESENT ILLNESS:  A 45 year old married, Caucasian male with  history of CAD status post Cipher drug eluting stents placements to the  LAD in October 2003.  He has had multiple caths and IVUS since then,  always revealing nonobstructive disease.  He walks several times per  week without limitations and has otherwise been doing well.  This  morning at approximately 9 a.m., he was standing in a courtroom and  developed an intensive sternal chest pressure with shortness of breath,  diaphoresis and  EMS was activated.  Upon their arrival, EKG showed  anterior ST segment elevation and he received three sublingual  nitroglycerin sprays without relief.  Code STEMI was activated and he  was taken emergently to the [facility] cath lab.  He continues to  complain of 8/10 chest pain with anterior ST elevation with plans for  emergent cardiac catheterization.   ALLERGIES:  ALEVE.   MEDICATIONS:  1. Xanax 1 mg daily.  2. Aspirin 325 mg daily.  3. Neurontin 300 mg t.i.d.  4. Nitroglycerin p.r.n.  5. Vytorin 10/80 mg daily.  6. Lexapro 20 mg daily.  7. Lisinopril 20 mg daily.  8. TobraDex 100 mg daily.  9. Advair one puff b.i.d.  10.Albuterol p.r.n.  11.Oral diabetic medication.   FAMILY HISTORY:  Mother is alive and is 59 with history of CAD.  Father  died at age 69 following CVA and cancer.  He has four sisters and one  brother, all are alive and  well.   SOCIAL HISTORY:  He lives in [place] with his wife and 2 children, ages 13  and 22.  He is disabled.  He has a 25 pack-a-year history of tobacco  abuse, currently smoking between 1 and 1 1/2 packs per day.  He drinks  an alcoholic beverage on his birthday and on New Year's but otherwise  does not drink.  He denies any drug use.  He walks a couple of days a  week without any significant limitations.   REVIEW OF SYSTEMS:  Positive for chest pain, shortness of breath,  dyspnea on exertion, and nausea as well as history of diabetes.  All  other systems reviewed are negative.   PHYSICAL EXAMINATION:  Blood pressure 177/114.  Pulse 88.  Respirations  12.  He is afebrile.  His pulse ox is 99% on 15 liters on a  nonrebreather.  Pleasant white male who is uncomfortable at this time  complaining of chest pain.  NECK:  Obese, difficult to assess JVP, there is no bruits.  LUNGS:  Respirations regular, unlabored.  Clear to auscultation.  CARDIAC:  Regular S1, S2, no S3, S4 or murmur.  ABDOMEN:  Obese, soft, nontender, nondistended.  Bowel  sounds present.  EXTREMITIES:  Warm and dry, pink.  No clubbing, cyanosis or edema.  Dorsalis pedis, posterior tibial pulses 1+ equal bilaterally.   Chest x-ray is pending.  EKG shows sinus rhythm with anterior ST segment  elevation.  Lab work is pending.   ASSESSMENT/PLAN:  1. Acute anterior ST elevation/coronary artery disease, emergent      catheterization with strong suspicion for latency thrombosis in the      LAD.  Continue aspirin, beta blocker, Ace inhibitor, statin and add      Plavix.  2. Hypertension, blood pressure markedly elevated in the setting of      chest pain.  Continue home meds and titrate as necessary.  3. Hyperlipidemia.  Check lipid panels, continue Vytorin.  4. Type II diabetes mellitus, he is on oral medication at home      although he does not know the name.  Will add sliding scale insulin      and CBGs.  5. Obstructive sleep apneas, CPAP per respiratory.  6. Tobacco abuse, cessation strongly advised.  We will obtain a      smoking cessation consult.  7. Obesity, cardiac rehab.  8. Depression/anxiety, continue home meds .      [name], ANP      Juanda Bond. Burt Knack, MD  Electronically Signed    CB/MEDQ  D:  09/04/2006  T:  09/05/2006  Job:  [id number]

## 2010-11-30 NOTE — Discharge Summary (Signed)
[place]  Patient:    [name], [name] Visit Number: [id number] MRN: [id number]          Service Type: CAT Location: [id number] Attending Physician:  Wadie Lessen Dictated by:   Sharyl Nimrod, P.A.-C. Admit Date:  11/13/2001 Disc. Date: 11/14/01   CC:         Satira Sark, M.D. [facility]  Mitzie Na. Quillian Quince, M.D.   Referring Physician Discharge [place]:  Jul 26, 2067  ADMITTING PHYSICIAN: Satira Sark, M.D.  DISCHARGING PHYSICIAN:  Minus Breeding, M.D.  HISTORY OF PRESENT ILLNESS:  [name] is a 45 year old white male who presented with a several month history of exertional chest pressure and shortness of breath relieved with sublingual nitroglycerin.  He has been evaluated previously by catheterization in 2001, which showed nonobstructive coronary artery disease, i.e. 30% LAD and circumflex.  He feels that his symptoms have become much worse since that time and is very concerned about them.  He has a history of depression, significant tobacco use, a history of premature family coronary artery disease, and occasional alcohol.  At [facility], the H&H was 16.2 and 46.3 with normal indices, platelets 238, WBC 10.9, glucose 109, BUN 13, creatinine 1.0, sodium 139, potassium 3.8, PT 13.1, and PTT 24.2.  The EKG showed a normal sinus rhythm and nonspecific ST-T wave changes.  HOSPITAL COURSE:  The patient was transferred to [facility] for further evaluation.  Cardiac catheterization was performed on September 13, 2001, by Minus Breeding, M.D.  According to his progress note, this showed an EF of 60%.  He had a 40% proximal RCA and a 50% proximal LAD.  Minus Breeding, M.D., had [name]. [name], M.D., IVUS the lesions and felt that they were 39.9% and 63.9% respectively.  They both felt that he should undergo aggressive medical treatment and risk factor modification.  Post sheath removal  and bed rest, the catheterization site was okay.  After reviewing with Minus Breeding, M.D., on Nov 14, 2001, it was felt that he could discharged home.  DISCHARGE DIAGNOSIS:  Noncardiac chest discomfort.  DISPOSITION:  He is discharged home.  He is participating in the ASTROID study drug to start in one to two weeks.  Research will give to patient and arrange follow-up appointments.  DISCHARGE MEDICATIONS:  He is asked to continue his home medications, which include Effexor XR 150 mg q.d., Wellbutrin SR 150 mg b.i.d., Tranxene 7.5 mg b.i.d., and coated aspirin 325 mg q.d.  ACTIVITY:  He was advised no driving, heavy lifting, or tub baths for two days.  DIET:  Maintain a low-salt, low-fat, and low-cholesterol diet.  WOUND CARE:  If he has any problems with his catheterization site, he was asked to call the office.  SPECIAL INSTRUCTIONS:  He was advised no smoking or tobacco products.  FOLLOW-UP:  He was asked to call our [place], [place], office on Monday to arrange a two to three week appointment with Satira Sark, M.D. Dictated by:   Sharyl Nimrod, P.A.-C. Attending Physician:  Wadie Lessen DD:  11/14/01 TD:  11/14/01 Job: [id number]

## 2010-11-30 NOTE — Procedures (Signed)
   NAME:  [name], [name]                    ACCOUNT NO.:  [id number]   MEDICAL RECORD NO.:  [id number]                   PATIENT TYPE:  EMS   LOCATION:  ED                                   FACILITY:  [facility]   PHYSICIAN:  Alonza Bogus, M.D.              DATE OF BIRTH:  01-29-1966   DATE OF PROCEDURE:  03/02/2002  DATE OF DISCHARGE:  03/02/2002                                EKG INTERPRETATION   The rhythm is a sinus rhythm with a rate in the 80s.  There are small Q-  waves inferiorly which are of questionable significance and the R-wave in V2  is rather tall.  If the Q-waves in the inferior leads are of any  significance, this could also indicate true posterior extension but my  impression is that these are small and probably no significance.  Clinical  correlation is suggested.                                               Alonza Bogus, M.D.    ELH/MEDQ  D:  03/02/2002  T:  03/03/2002  Job:  [id number]

## 2010-11-30 NOTE — Cardiovascular Report (Signed)
NAMEGIULIANI, [name]          ACCOUNT NO.:  [id number]   MEDICAL RECORD NO.:  [id number]          PATIENT TYPE:  OIB   LOCATION:  2807                         FACILITY:  [facility]   PHYSICIAN:  Juanda Bond. Burt Knack, MD  DATE OF BIRTH:  19-Oct-1965   DATE OF PROCEDURE:  09/04/2006  DATE OF DISCHARGE:                            CARDIAC CATHETERIZATION   PROCEDURE:  Left heart catheterization, selective coronary angiography,  left ventricular angiography, PTCA of the LAD and IVUS of the LAD.   INDICATIONS:  [name] is a 45 year old gentleman with known coronary  artery disease.  He is status post drug-eluting stent placement in his  proximal LAD back in 2003.  He has had multiple heart catheterizations  and has documented diffuse coronary artery disease.  He presented with  the acute onset of chest pain this morning and was found to have ST  elevation on his ECG.  He was brought directly to the cath lab by  [facility].   PROCEDURAL DETAILS:  The was interviewed and examined in the cath lab.  Risks and indications of the catheterization procedure were reviewed in  detail.  Informed consent was obtained.  The right groin was prepped and  draped under normal sterile conditions.  Using the modified Seldinger  technique, a 5-French venous sheath was placed in the right femoral  vein, and a 6-French arterial sheath was placed in the right femoral  artery.  A diagnostic JR4 catheter was used to image the right coronary  artery.  Following multiple views of the right coronary artery, a 6-  [place] CLS guide catheter was inserted into the left coronary artery.  Multiple views were taken demonstrating an occluded LAD.  At that point,  heparin and Integrilin were given for anticoagulation.  Once a  therapeutic ACT was achieved, a Cougar guidewire was passed beyond the  LAD occlusion which happened to be in the proximal portion of the  previous stent.  A 2.5 x 15-mm maverick balloon was  inserted across the  lesion and was inflated to 10 atmospheres.  Reperfusion was achieved  with this.  There was significant residual stenosis seen in the stent.  A 3.5 x 15-mm Quantum Maverick balloon was used and dilated up to 16  atmospheres on 2 inflations.  At this point, the patient was continuing  to have chest pain, and despite normal flow through the stented segment,  had residual ST elevation.  Careful views of the apical portion of the  LAD demonstrated an occluded apical LAD.  The wire was taken down beyond  the area of occlusion out to the apical LAD that wraps around the  inferior portion of the apex.  Intracoronary verapamil was given.  This  area was too distal to balloon or manipulate.  Therefore, I elected to  focus on the area of stent thrombosis.  At this point, IVUS was  performed and demonstrated a widely patent stent.  There were some areas  of eccentric plaque on the luminal side of the stent that may be  residual thrombus.  There did appear to be normal neointimal tissue  growth through much  of the stented segment and the stent appeared  opposed to the vessel.  However, the distal reference vessel was at  least 4 mm.  Because of the size of the distal reference vessel, I  elected to dilate the stent with a 4-0 noncompliant balloon up to 20  atmospheres throughout the stented segment.  After those inflations, the  stent appeared well-inflated.  There was improved flow out in the apical  portion of the LAD with some residual thrombus.  The wire and guiding  catheter were removed over a wire, and a pigtail catheter was inserted  into the left ventricle.  Left ventricular pressures were recorded.  A  left ventriculogram was done.  A pullback across the aortic valve was  performed.  At the conclusion of the case, the patient's chest pain was  2/10.  At presentation, it was 8/10.   FINDINGS:  Aortic pressure 139/92 with a mean of 113, left ventricular  pressure 144/13  with an end-diastolic pressure of 31.   Left mainstem is angiographically normal.  It bifurcates into the LAD  and left circumflex.  The LAD is occluded in its proximal portion just  beyond the first diagonal branch.  This is in the proximal portion of  the previously placed stent.  There is no flow beyond the occlusion.  The first diagonal branch is very large.  There is a 40% proximal  stenosis noted in that vessel.   Left circumflex is large-caliber and gives off a large proximal obtuse  marginal branch that has nonobstructive plaque.  Second obtuse marginal  also has nonobstructive plaque.  There is a left posterolateral branch  supplied by the left circumflex as well.  There is no high-grade  obstructive disease throughout the left circumflex system.   The right coronary artery is dominant.  There is diffuse irregular  plaque of at least 75% stenosis involving a long segment of the proximal  and mid right coronary artery.  Following the high-grade stenotic  lesion, there is a 40% stenosis in the midportion of the vessel.  The  distal RCA has nonobstructive plaque and then gives off a right PDA  which also has nonobstructive plaque.   Left ventriculography performed in the 30 degrees right anterior oblique  projection demonstrates akinesis of the apex and inferoapex.  Other  segments of the left ventricle are hyperdynamic.  The overall LVEF is  estimated at 50%.   ASSESSMENT:  1. Anterior myocardial infarction secondary to very late stent      thrombosis.  2. Severe diffuse right coronary artery stenosis.  3. Mild left ventricular dysfunction in the setting of the patient's      acute myocardial infarction.   PLAN:  As described above, PCI of the LAD was performed using IVUS  guidance.  The patient's stent thrombosis was treated with balloon  angioplasty alone with a good angiographic result.  The patient will likely need a staged procedure, ultimately, to treat his right  coronary  artery.  He will require lifelong aspirin and Plavix.  He also will be  treated with 18 hours of Integrilin and treated with routine post-MI  care.      Juanda Bond. Burt Knack, MD  Electronically Signed     MDC/MEDQ  D:  09/04/2006  T:  09/04/2006  Job:  [id number]   cc:   Michele Mcalpine, MD

## 2010-11-30 NOTE — Discharge Summary (Signed)
[place]  Patient:    [name], [name] Visit Number: [id number] MRN: [id number]          Service Type: MED Location: [id number] Attending Physician:  Satira Sark Dictated by:   Mannie Stabile, P.A. Admit Date:  12/30/2001 Disc. Date: 12/31/01   CC:         [place]. Quillian Quince, M.D.  [facility], [place], [place]   Referring Physician Discharge [place]  PROCEDURES:  None.  HISTORY OF PRESENT ILLNESS:  The patient is a 45 year old male who had recent coronary angiography this past May revealing nonobstructive coronary artery disease and normal left ventricular function.  He presented with recurrent chest pain.  LABORATORY DATA:  Normal CBC.  INR 0.9.  Potassium 3.3 on admission and otherwise normal sodium, renal function, and normal liver enzymes.  Cardiac enzymes:  CPK-MB negative x 2, troponin I 0.01 (x 2).  Admission CXR:  No acute changes.  HOSPITAL COURSE:  The patient initially presented to the emergency room with recurrent chest discomfort requiring treatment with intravenous nitroglycerin and morphine.  He was admitted for rule out of MI and further diagnostic evaluation.  The patients recent coronary angiogram revealed 50% proximal LAD, 25% CFX, 40% RCA, and normal left ventricular function.  IVUS was also utilized, revealing an area of 60% with 39% diameter.  Following admission, the patient ruled out for MI with normal serial cardiac enzymes.  Recent films were reviewed by Satira Sark, M.D., the patients primary cardiologist, and continued medical therapy was recommended. Dr. Domenic Polite felt that the patients symptoms were multifactorial, but that there was no clear evidence of acute coronary syndrome.  The patient has a psychiatric history (anxiety/depression) and is on multiple medications.  He is also currently out of work and continues to smoke as well. Given this context, it  was felt that the patient would benefit by psychiatric follow-up and arrangements were made at the time of discharge to have the patient follow up with the [facility] in [place], [place], within the next few weeks.  When questioned about his specific symptoms, the patient did admit that they were relieved with morphine and not improved with nitroglycerin.  They are also intermittent in nature.  The patient was allowed to ambulate and reported no specific exertional chest pain.  He was cleared for discharge the day following admission.  The patient was also noted to have elevated liver enzymes during his recent admission.  He therefore was excluded from the ASTEROID study.  However, given that his follow-up liver enzymes this admission were within normal limits, the recommendation was to initiate statin therapy.  Extensive discussions took placed with the patient regarding cardiac risk factor modification and lifestyle changes.  Satira Sark, M.D., also noted that the patient has heretofore not been successful with lifestyle modification and would therefore not be a good candidate for revascularization.  He therefore did not recommend pursuing with relook coronary angiography at this time.  However, if this were indicated in the near future, then consideration would be given to "flow wire" assessment of the LAD before consideration of above percutaneous intervention.  DISCHARGE MEDICATIONS: 1. Zocor 20 mg q.h.s. (new). 2. Coated aspirin 325 mg q.d. 3. Nitro-Dur 0.2 mg/hr on at 8 a.m. and off at 10 p.m. 4. Effexor XR 150 mg q.d. 5. Wellbutrin SR 150 mg b.i.d. 6. Tranxene 7.5 mg b.i.d. 7. Nitrostat 0.4 mg p.r.n.  DIET:  Maintain a low-fat, low-cholesterol diet.  SPECIAL INSTRUCTIONS:  Stop smoking tobacco.  FOLLOW-UP:  The patient is scheduled for a follow-up fasting lipid profile at the [facility] on February 11, 2002, at 8:45  a.m.  Arrangements will be made for outpatient follow-up with the [facility] in [place], [place], within the next few weeks.  The patient is also instructed to follow up with his primary care physician, Mitzie Na. Daniel, M.D., in the next few weeks and to return to Satira Sark, M.D., at the [facility] in approximately three months for continued cardiac follow-up.  DISCHARGE DIAGNOSES: 1. Noncardiac chest pain.    a. Normal serial cardiac enzymes.    b. Nonobstructive coronary artery disease and normal left ventricular       function by cardiac catheterization on May of 2003. 2. Anxiety/depression. 3. Tobacco. 4. Dyslipidemia.    a. Zocor initiated.    b. Recent exclusion for ASTEROID trial secondary to elevated liver enzymes       (now normalized). 5. Hypokalemia. 6. Obesity. Dictated by:   Mannie Stabile, P.A. Attending Physician:  Satira Sark DD:  12/31/01 TD:  12/31/01 Job: [id number]

## 2010-11-30 NOTE — Cardiovascular Report (Signed)
NAME:  [name], [name]                    ACCOUNT NO.:  [id number]   MEDICAL RECORD NO.:  [id number]                   PATIENT TYPE:  INP   LOCATION:  3703                                 FACILITY:  [facility]   PHYSICIAN:  Eustace Quail, MD [facility]                DATE OF BIRTH:  05/29/1966   DATE OF PROCEDURE:  04/22/2002  DATE OF DISCHARGE:  04/24/2002                              CARDIAC CATHETERIZATION   CLINICAL HISTORY:  The patient is 45 years old and has had multiple previous  cardiac catheterizations for his nonobstructive coronary disease.  He is  currently disabled and lives in [place].  He was admitted today with prolonged  chest pain and seen by Dr. Ron Parker.  His EKG showed some anterior ST-T changes  and he was felt to have unstable angina.  He was brought urgently to the  catheterization laboratory for evaluation.   DESCRIPTION OF PROCEDURE:  The procedure was performed via the right femoral  artery using arterial sheaths and #6 [place] preformed coronary catheters.  A  front wall arterial puncture was performed and Omnipaque contrast was used.  At the completion of the diagnostic study, we made a decision to proceed  with intervention on a ruptured plaque in the LAD.  I reviewed the films  with Dr. Albertine Patricia before we made the decision to treat the lesion because the  lesion was not severely obstructive.   The patient was on an Integrilin drip and was given additional heparin to  prolong an ACT greater than 200 seconds.  We gave him Plavix 300 mg at the  end of the procedure.  We used a JL4 #7 [place] guiding catheter with  sideholes and a short luge wire.  We crossed the lesion with the wire  without difficulty.  We direct-stented with a 3.5 x 18-mm CYPHER stent,  deploying this with one inflation of 16 atmospheres for 40 seconds.  We then  post dilated the distal portion of the stent where the artery was a little  bit more ectatic with a 4.0 x 15-mm Quantum Maverick and performed  one  inflation up to 10 atmospheres for 30 seconds.  Repeat diagnostic study was  then performed through the guiding catheter.  The patient tolerated the  procedure well and left the laboratory in a satisfactory condition.   RESULTS:  1. Left main coronary artery:  The left main coronary artery was free of     significant disease.  2. Left anterior descending artery:  The left anterior descending artery     gave rise to three septal perforators and two diagonal branches.  There     was a 70% narrowing in the proximal LAD which was most evident in the LAO     view.  In the lateral view, we could see there was a slight hangup of     contrast and we could identify a ruptured plaque with a fissure.  The     distal left was irregular but free of significant disease.  3. Circumflex artery:  The circumflex artery gave rise to an intermedius     branch, a marginal branch, and a posterolateral branch.  There was 40%     narrowing in the proximal circumflex and there was 30% narrowing in the     marginal branch.  There were irregularities throughout.  4. Right coronary artery:  The right coronary artery was a small- to     moderate-sized vessel that gave rise to two right ventricular branches, a     posterior descending branch, and a very tiny posterolateral branch.     There was 30% proximal and 30% mid stenosis and irregularities throughout     the proximal and mid right coronary artery.  5. Left ventriculogram:  The left ventriculogram performed in the RAO     projection showed all wall motion with an ejection fraction of greater     than 60%.   RESULTS:  Following stenting of the proximal LAD, the stenosis improved from  70% to less than 10%.   PRESSURES:  1. The aortic pressure was 100/70 with a mean of 84.  2. The left ventricular pressure was 100/29.    CONCLUSION:  1. Coronary artery disease with 70% narrowing in the proximal left anterior     descending artery (ruptured plaque), a  40% proximal narrowing in the     circumflex artery with 30% narrowing in the first marginal branch, a 30%     proximal and 30% mid stenosis in the right coronary artery with     irregularities in the proximal and mid vessel, and normal left     ventricular function.  2. Successful stenting of the proximal left anterior descending artery     stenosis with a CYPHER stent with improvement in percent area narrowing     from 70% to less than 10%.   DISPOSITION:  The patient was transferred to the post angioplasty unit for  further observation.                                               Eustace Quail, MD [facility]    BB/MEDQ  D:  04/22/2002  T:  04/25/2002  Job:  [id number]   cc:   Dola Argyle, MD [facility]   Cardiac Catheterization Lab

## 2010-11-30 NOTE — Cardiovascular Report (Signed)
NAME:  [name], [name]                    ACCOUNT NO.:  [id number]   MEDICAL RECORD NO.:  [id number]                   PATIENT TYPE:  INP   LOCATION:  2910                                 FACILITY:  [facility]   PHYSICIAN:  Loretha Brasil. Lia Foyer, M.D. [facility]         DATE OF BIRTH:  1966/04/14   DATE OF PROCEDURE:  10/12/2002  DATE OF DISCHARGE:                              CARDIAC CATHETERIZATION   INDICATIONS:  The patient is a 45 year old who has had a long history of  recurrent chest pain.  He unfortunately continues to smoke.  He has had  prior stenting of the left anterior descending artery done with about a 70-  80% lesion.  According to Dr. Domenic Polite, he has continued to have chest pain  following all of this.  He was seen in the emergency room in [place], and then  apparently left AMA, but then came back and is referred for further  evaluation.   PROCEDURE:  1. Left heart catheterization.  2. Selective coronary arteriography.  3. Selective left ventriculography.   DESCRIPTION OF PROCEDURE:  The patient was brought to the catheterization  lab and prepped and draped in the usual fashion.  Through an anterior  puncture, the right femoral artery was entered.  There seemed to be some  moderate scar tissue in the right femoral artery, but we were able to put in  a 6-French sheath.  Views of the left and right coronary arteries were  obtained in multiple angiographic projections.  Ventriculography was  performed in the RAO projection.  There was also noted to be some moderate  calcification.  The exact location of this calcification is uncertain.  It  appears to be within the region of the right ventricle either at the  tricuspid annulus or the right ventricular outflow tract, although it does  not encircle the area where the right coronary artery is.  A 2-D  echocardiogram is going to be obtained to try to better identify this.  The  patient tolerated the procedure well, and there were  no complications.   HEMODYNAMIC DATA:  1. Central aortic pressure 117/80.  2. Left ventricle 107/8/21.  3. No gradient on pullback across the aortic valve.   LEFT VENTRICULOGRAPHY:  1. Ventriculography was done in the RAO projection.  2. Overall left ventricular function was well preserved.  3. The ejection fraction was calculated at 67%.   CORONARY ANGIOGRAPHY:  1. The left main coronary artery is large and free of critical disease.  2. The left anterior descending artery has a number of areas of mild luminal     irregularity.  In the mid vessel, there is a previously-placed CYPHER     stent which is widely patent.  Just distal to this is an area of mild     bend in the artery where there is about 30% eccentric plaquing.  The     distal vessel wraps the apical tip.  The first diagonal  branch also has a     fair amount of luminal irregularity with a approximately eccentric 40%     stenosis noted in the LAO cranial view.  3. The circumflex provides a fairly large first-marginal that bifurcates     distally.  There is some mild luminal irregularity proximally that was     graded as 10%.  There is a second marginal that also has about 10%     narrowing.  There are three posterolateral branches which supply a     significant portion of the posterolateral myocardial segment.  4. The right coronary consists of a single PDA.  There are multiple areas of     luminal irregularity throughout the right coronary with 20%, 30%, 20% and     30% areas of mild plaquing throughout the proximal vessel.  Also noted is     calcification which the exact location is unclear.   CONCLUSIONS:  1. Preserved left ventricular function.  2. No evidence of significant restenosis.  3. Multiple areas of luminal irregularity.  4. Calcification in questionable location.   DISPOSITION:  1. A 2-D echocardiogram.  2. Discontinue smoking.  Following catheterization, I had an extensive     discussion with the  patient about the implications of smoking, and the     potential risks associated with this with regard to acute coronary     syndromes.  3. Continue statin therapy.  4. All measures directed at reduction in risk factors.                                               Loretha Brasil. Lia Foyer, M.D. [facility]    TDS/MEDQ  D:  10/12/2002  T:  10/12/2002  Job:  [id number]   cc:   Gar Ponto  [address] [place] [address]  [place] [address]  Fax: [phone]   Satira Sark, M.D. [facility]   CVTS Lab

## 2010-11-30 NOTE — Discharge Summary (Signed)
[place]. [facility]  Patient:    [name], [name]                 MRN: [id number] Adm. Date:  [id number] Disc. Date: [id number] Attending:  Minus Breeding Dictator:   Erlene Quan, P.A.-C. [facility] CC:         Gar Ponto, M.D. - [address] [place], [place] [address]             Sandy Salaam. Deatra Ina, M.D. [facility]                           Discharge Summary  DISCHARGE DIAGNOSIS:  Chest pain, of undetermined etiology with nonsignificant coronary artery disease by cardiac catheterization, negative spiral CT, and negative endoscopy this admission.  HISTORY OF PRESENT ILLNESS:  The patient is a 45 year old male followed by Dr. Gar Ponto, who presented on September 24, 1999, with chest pain.  He apparently had a cardiac catheterization in January and had scattered disease of 25% or less.  HOSPITAL COURSE:  He was admitted to telemetry.  CPKs, MBs, and troponins were obtained.  He ruled out for a myocardial infarction.  A cardiac catheterization was done on September 26, 1999, and showed 30% RCA, 30% LAD, normal LV function.  The plan is for medical therapy.  A spiral CT was negative for a pulmonary embolism.  He was seen in consultation by the GI service and underwent an endoscopy which was negative for peptic ulcer disease.  The plan is for Protonix 40 mg q.d.  FOLLOWUP:  He will follow up with Dr. Sandy Salaam. Deatra Ina in a couple of weeks. The patient will follow up with Dr. Gar Ponto.  LABORATORY DATA:  White count 9.4, hemoglobin 16.3, hematocrit 45.5, platelet count 226.  INR normal on admission.  Sodium 139, potassium 3.4, BUN 13, creatinine 0.8. Lipid profile shows an LDL of 110, HDL 35.  CPKs, MBs, and troponins were negative. TSH 1.66.  CONDITION ON DISCHARGE:  The patient is discharged in stable condition.  DISCHARGE MEDICATIONS: 1. Protonix. 2. Xanax p.r.n. DD:  09/27/99 TD:  09/27/99 Job: [id number]

## 2010-11-30 NOTE — Discharge Summary (Signed)
[place]. [facility]  Patient:    [name], [name]                 MRN: [id number] Adm. Date:  [id number] Disc. Date: 10/22/00 Attending:  Minus Breeding Dictator:   Davis Gourd, P.A. CC:         Christy Sartorius, M.D.  Mitzie Na Quillian Quince, M.D.   Referring Physician Discharge [place]:  02-01-1966.  PROCEDURES:  None.  HOSPITAL COURSE:  Mr. Gadway is a 45 year old male with a history of an abnormal Cardiolite and a cardiac catheterization in March of 2001 that showed a 30% blockage in his LAD and a 30% blockage in his circumflex.  Since that time, he has done well but was admitted on October 22, 2000 for chest pain.  He also had a negative spiral CT and a negative EGD.  He reports almost daily chest pain and states that he has been using nitroglycerin for this but ran out three days ago.  The symptoms that he presented with were unchanged from the symptoms for which he received a cardiac catheterization.  He states it is a 10/10 and is associated with diaphoresis and nausea and vomiting and his left arm goes numb.  It is not reproducible with palpation or exertion and it occurs at rest.  He was admitted to rule out MI and for further evaluation. He had been having chest pain for more than 12 hours continuously and CK-MB and troponin I were negative x 2.  He was evaluated by Dr. Minus Breeding and by Dr. Wallis Bamberg. Nishan and they felt that a dobutamine stress echo was the optimal method of testing and that it would be acceptable to do this as an outpatient next week.  He is also to follow up with Dr. Christy Sartorius. Mr. Merrihew was considered stable for discharge on October 22, 2000.  LABORATORY DATA:  Chest x-ray:  No infiltrative or edematous changes.  There is cardiomegaly and mildly accentuated basilar bronchovascular markings.  Laboratory values:  Hemoglobin 15.0, hematocrit 42.4, WBC 8.8 and platelets 227,000.  Sodium 142, potassium  3.7, chloride 106, CO2 89, BUN 13, creatinine 0.7.  CK-MB 127/1.2 with a troponin I of less than 0.01 x 2.  DISCHARGE CONDITION:  Stable.  CONSULTANTS:  None.  COMPLICATIONS:  None.  DISCHARGE DIAGNOSES: 1. Chest pain, no obvious cardiac source at this time, followup as an    outpatient. 2. Cardiac catheterization in March of 2001 showing a long 30% stenosis in the    right coronary artery and a 30% stenosis in the circumflex with an ejection    fraction greater than 65%. 3. Ongoing tobacco use. 4. Esophagogastroduodenoscopy in March of 2001:  Normal endoscopy.  DISCHARGE INSTRUCTIONS: 1. His activity level is to include no strenuous activity. 2. He is to stick to a low-fat diet. 3. He is to use no tobacco. 4. He is to get a stress test on October 28, 2000, which is Tuesday, at 4 p.m. 5. He is to follow up with Dr. Lyndel Safe on May 7th at 9:45 a.m. 6. He is to follow up with Dr. Mitzie Na. Daniel as needed.  DISCHARGE MEDICATIONS:  Aspirin p.o. b.i.d. DD:  10/22/00 TD:  10/22/00 Job: [id number]

## 2010-11-30 NOTE — Cardiovascular Report (Signed)
NAME:  [name], [name]          ACCOUNT NO.:  [id number]   MEDICAL RECORD NO.:  [id number]          PATIENT TYPE:  OIB   LOCATION:  1962                         FACILITY:  [facility]   PHYSICIAN:  Eustace Quail, M.D. [facility] DATE OF BIRTH:  01-26-1966   DATE OF PROCEDURE:  08/19/2005  DATE OF DISCHARGE:                              CARDIAC CATHETERIZATION   CLINICAL HISTORY:  Mr. Zani is a 45 year old and is disabled for multiple  medical reasons.  In 2003, he had a what we think is a Cypher stent placed  to the LAD.  He was studied in July of 2005 at which time he had  nonobstructed disease.  He was enrolled in the Stradivarius trial and had  IVUS of both the second and first marginal branches.  Recently he developed  chest pain, somewhat concerning for angina.  He was seen by Dr. Isaias Cowman and scheduled for evaluation of catheterization.  It was planned that  he also had follow-up IVA study at this part of the Stradivarius trial at  the same time.  This was scheduled in the outpatient laboratory.   PROCEDURES:  The procedure was performed by the right femoral artery and  arterial sheath.  Will use 5 venti catheter for the right coronary injection  and the pigtail catheter for the left ventricular ejection.  We used a 6  [place] EBU guiding catheter towards the left coronary system instead of  IVUS.  After obtained access, the patient was given weight-adjusted Heparin  to pull on a ACV variant __________.  After completing the diagnostic study,  we passed a loose wire down in the second marginal branch.  We passed an  Atlantis IVUS catheter down about 2/3 of the way down the marginal branch  and did automatic pull back. Nitroglycerin was given prior to placement of  the IVUS catheter.  We then rerouted a wire down the first marginal branch  and passed the Atlantis IVUS catheter down about 1/2 way down the first  marginal branch and did automatic pullback.  Nitroglycerin was again  given  prior to placement of the IVUS catheter.   Final diagnostic picture was performed to the guiding catheter.  We closed  the right femoral artery with AngioSeal.  The patient tolerated the  procedure well and left the laboratory in satisfactory condition.   RESULTS:  The aortic pressure was 116/81 with a mean of 98.  Left  ventricular pressure was 116/7.  LEFT MAIN CORONARY ARTERY:  The left main coronary artery was free of  disease.   LEFT ANTERIOR DESCENDING ARTERY:  The left anterior descending artery gave  rise to a large and small diagonal branch with 3 sets of perforators.  The  stent was located approximately 30% focal narrowing of the mid portion.  Results are 40% narrowing in the first diagonal branch with __________  before the stent.   CIRCUMFLEX ARTERY:  The circumflex artery gave rise to a first marginal  branch, second marginal branch and three posterolateral branches.  There was  30% narrowing after the first marginal branch.  There were aortic guides in  the  mid circumflex artery and in the second marginal branch and there was  50% narrowing in the mid and distal portions of the second marginal branch.   RIGHT CORONARY ARTERY:  The right coronary artery was a co-dominant vessel,  gave rise to a clonus branch and right ventricular branch and a posterior  descending branch, but no posterolateral branches.  The left was irregular  and there was 50-60% narrowing in the proximal right coronary artery.  There  was 40% narrowing in the mid and 50% narrowing in the mid and distal  vessels.   Left ventriculogram was performed in the RAO projection.  It showed good  wall motion with no areas of hypokinesis.  The estimated ejection fraction  was 50%.   Intravascular ultrasound of the second marginal branch showed a moderate  amount of plaque, both in the marginal branch and in the AV circumflex  artery proximal to marginal branch.  An area of greatest narrowing was  about  50% area stenosis.  Vessel caliber was somewhat small and about 2.25.   IVUS to the first marginal branch showed minimal plaque.   CONCLUSION:  1.  Not obstructive coronary artery disease with 30% narrowing within the      stent and the proximal LAD, 40% narrowing in the first diagonal branch      and the LAD, 30% narrowing in the proximal circumflex artery with 50%      narrowing in the secondary marginal branch of the circumflex artery, 60%      proximal, 40% mid and 50% mid to distal stenosis in the right coronary      and normal LV function.  2.  Intravascular ultrasound measurements of the second and first marginal      branch is part of the Stradivarius trial.   RECOMMENDATIONS:  The patient is non obstructive disease.  I think his  recent symptoms are not likely ischemic.  We will plan to continue medical  management.  He will be due to come off the Stradivarius drug and research  team will contact him regarding this.  We will arrange cardiac follow up  with Drs. Isaias Cowman and Rozann Lesches in [place] and plan for him to  follow up primary care with Dr. Gar Ponto in [place].           ______________________________  Eustace Quail, M.D. [facility]     BB/MEDQ  D:  08/19/2005  T:  08/19/2005  Job:  [id number]   cc:   Otelia Limes. Duran, P.A.  [address]  [place]  [place] [address]   Satira Sark, M.D. [facility]  [address] [place]  [place] [address]

## 2010-11-30 NOTE — Procedures (Signed)
[place]. [facility]  Patient:    [name], [name]                 MRN: [id number] Proc. Date: 09/27/99 Adm. Date:  [id number] Attending:  Minus Breeding CC:         Minus Breeding, M.D. [facility]                           Procedure Report  HISTORY:  The patient is a 45 year old male with persistent chest pain. Cardiac catheterization did not show significant coronary artery disease.  The test was  performed for further evaluation.  INFORMED CONSENT:  The patient provided consent after the risks, benefits and alternatives were explained.  MEDICATIONS:  Versed 7, fentanyl 60 and Robinul 0.2 mg IV.  DESCRIPTION OF PROCEDURE:  The patient was placed in the left lateral decubitus  position, administered continuous low flow oxygen and was placed on pulse oximetry. Cetacaine spray was applied.  The Olympus video gastroscope was inserted under direct vision through the oropharynx and esophagus.  FINDINGS:  Normal esophagus, stomach and duodenum.  IMPRESSION:  Normal upper endoscopy.  RECOMMENDATIONS:  Continue Protonix 30 mg q.d.  If chest pain continues, would proceed with 24 hour pH study and esophageal manometry to rule out GERD and esophageal spasm, respectively. DD:  09/27/99 TD:  09/27/99 Job: [id number]

## 2010-11-30 NOTE — Cardiovascular Report (Signed)
NAME:  [name], [name]                    ACCOUNT NO.:  [id number]   MEDICAL RECORD NO.:  [id number]                   PATIENT TYPE:  INP   LOCATION:  2930                                 FACILITY:  [facility]   PHYSICIAN:  Ethelle Lyon, M.D. [facility]         DATE OF BIRTH:  [birth year]   DATE OF PROCEDURE:  02/10/2004  DATE OF DISCHARGE:                              CARDIAC CATHETERIZATION   PROCEDURE:  Left heart catheterization, left ventriculography, coronary  angiography, intravascular ultrasound of the first and second obtuse  marginal branches.   INDICATIONS:  [name] is a 45 year old gentleman with coronary artery  disease who has undergone multiple cardiac catheterizations in the past,  which have often suggested a non-cardiac etiology to his chest discomfort.  However, he has also had significant coronary disease and is status post a  drug-alluding stent to the mid LAD.  He now presents with chest discomfort  occurring at rest.  He is ruled out for myocardial infarction.  He is  referred for diagnostic arteriography.  The patient consented to participate  in the STRADIVARIUS trial looking at intravascular ultrasound assessment of  plaque, response to a endocannabinoid receptor antagonist.   PROCEDURAL TECHNIQUE:  Informed consent was obtained.  Under 1% lidocaine  local anesthesia, a 6-French sheath was placed in the left common femoral  artery using the modified Seldinger technique.  Diagnostic angiography and  ventriculography were performed using JL4, JR4 and pigtail catheters.  We  then proceeded to intravascular ultrasound.  Heparin was administered to  achieve and maintain an ACT of greater than 200 seconds.  The patient was  continued on the eptifibatide with which he arrived in the lab.  A CLS 3.5  guide was advanced over wire and engaged in the ostium of the left main.  A  Luque wire was advanced into the distal portion of the second obtuse  marginal without  difficulty.  Intercoronary nitroglycerin (200 mcg) was  administered.  Automated pullback was then performed.  I was concerned that  the burden of calcium would exclude him from the study.  I therefore removed  the IVUS catheter and repositioned the wire in the distal portion of the  first obtuse marginal.  The IVUS catheter was then re-advanced into the  distal portion of this vessel.  Again, automated pullback was performed  after the administration of 200 mcg of intercoronary nitroglycerin.  The  patient tolerated the intravascular ultrasound well.  Wire and IVUS catheter  were removed.  The final angiograms demonstrated no change in the coronary  vasculature with persistent TIMI-3 flow.   COMPLICATIONS:  None.   FINDINGS:  1. LV:  128/18/29; EF 65% without reasonable wall motion abnormality.  2. Left main:  Angiographically normal.  3. LAD:  Large vessel giving rise to a single diagonal.  The stent in the     mid LAD is widely patent.  No other significant disease.  4. Circumflex:  Moderate sized vessel  giving rise to two large obtuse     marginals.  These are both angiographically normal.  5. RCA:  Moderate sized dominant vessel.  The proximal mid vessel has     diffuse disease of up to 40% stenosis.  6. No aortic stenosis or mitral regurgitation.   IMPRESSION/RECOMMENDATION:  Moderate nonobstructive coronary disease with  preserved left ventricular size and systolic function.  Suspect benign  cardiac etiology to his chest pain.  We will discharge home.                                               Ethelle Lyon, M.D. [facility]    WED/MEDQ  D:  02/10/2004  T:  02/10/2004  Job:  [id number]   cc:   Gar Ponto  [address], [place] [address]  [place] [address]  Fax: [phone]   Satira Sark, M.D. [facility]

## 2010-11-30 NOTE — Procedures (Signed)
[place]. [facility]  Patient:    [name], BOYLES Visit Number: [id number] MRN: [id number]          Service Type: CAT Location: [id number] Attending Physician:  Wadie Lessen Dictated by:   Loretha Brasil Lia Foyer, M.D. [facility] Proc. Date: 11/13/01 Admit Date:  11/13/2001 Discharge Date: 11/14/2001   CC:         [name]  Cardiac Catheterization Lab  Satira Sark, M.D. [facility]   Procedure Report  INDICATIONS:  [name] is a 45 year old who has had multiple prior catheterizations.  He has had chronic recurrent chest pain.  He underwent diagnostic catheterization earlier today by Dr. Percival Spanish and there was an approximate 50% stenosis in a large left anterior descending artery.  The issue of whether or not the patient needed percutaneous intervention was discussed in detail, and it was the consensus opinion that intravascular ultrasound should be performed.  The patient was also a candidate for the Asteroid study with open-label statin.  Discussion of risks, benefits, and alternatives were made with the patient regarding the intravascular ultrasound and he was brought to the catheterization lab for further evaluation.  DESCRIPTION OF PROCEDURE:  The patient was brought to the catheterization lab and prepped and draped in the usual fashion.  An in-dwelling #6 French sheath was exchanged for a #7 [place] sheath using double glove technique.  Heparin was given to prolong the ACT just in excess of 300 seconds.  A JL-4 guiding catheter was used to intubate the left main.  The lesion was crossed with a 0.014 Hi-Torque Floppy wire and then crossed with an Atlantis SR intravascular ultrasound catheter.  The catheter was then attached to the automatic pullback device, and the pullback performed.  Following the ultrasound information, I had Dr. Percival Spanish and Dr. Lyndel Safe come to the laboratory to review the ultrasound.  The ultrasound study revealed a  lumen of about 3.5 x 4, both proximal and distal to the lesion.  At the lesion, there was an eccentric plaque.  The residual lumen area was 5.8 mm sq.  It was the consensus opinion that the patient should be treated aggressively medically with an attempt at discontinuation of smoking, statin, and antiplatelet therapy.  If he continues to be symptomatic, then he would be a candidate potentially for a drug-eluting stent.  All the catheters were removed and he was taken to the holding area in satisfactory clinical condition.  ANGIOGRAPHIC DATA: 1. The left anterior descending artery coursed to the apex.  There were minor    luminal irregularities throughout the diagonal.  The distal LAD wrapped the    apex.  In the mid portion of the LAD was an approximate 50% stenosis.  It    was somewhat eccentric.  There was slight aneurysmal dilatation distally.  2. The circumflex was unchanged from the diagnostic study.  3. Intravascular ultrasound demonstrated fairly large lumen vessel with    dimensions of approximately 4 x 4 distally and even larger proximally. In    the mid portion of the artery just below the lesion was a lumen of    approximately 4 x 4 with eccentric plaquing.  Above the lesion was some    more concentric plaquing with a lumen of approximately 3.5 x 3.5.  At the    lesion site, there was a markedly eccentric plaque with some cavitation    within the plaque and the lumen area was 5.8 mm sq with a total area  of    15.9 mm sq yielding a percent stenosis of 63% and a percent diameter    narrowing of 39%.  IMPRESSION:  Moderate coronary artery disease with intravascular ultrasound dimensions as demonstrated.  PLAN:  The patient will be treated with open-label study drug.  In addition, he will be given aspirin and possibly clopidogrel.  If he continues to have symptoms, he may be a candidate for a drug-eluting stent. Dictated by:   Loretha Brasil Lia Foyer, M.D. [facility] Attending Physician:   Wadie Lessen DD:  11/13/01 TD:  11/16/01 Job: [id number]

## 2010-11-30 NOTE — H&P (Signed)
NAME:  ALICK, FOLKS NO.:  [id number]   MEDICAL RECORD NO.:  [id number]                   PATIENT TYPE:  INP   LOCATION:  2923                                 FACILITY:  [place]   PHYSICIAN:  Dola Argyle, MD [facility]                DATE OF BIRTH:  Oct 30, 1965   DATE OF ADMISSION:  04/22/2002  DATE OF DISCHARGE:                                HISTORY & PHYSICAL   HISTORY OF PRESENT ILLNESS:  This is a 45 year old male with known coronary  artery disease.  The last catheterization was in May of 2003.  At that time,  the LAD had a proximal 50% stenosis with some slight haziness.  The  circumflex was codominant and had a proximal 25% stenosis and diffuse  luminal irregularities.  The right coronary artery was codominant.  There  was proximal 40% stenosis with haziness and diffuse luminal irregularities.  At that time, the ejection fraction was 50% with normal wall motion.  IVUS  at that time confirmed the degree of stenosis, however, intervention was not  felt to be necessary at that time.  At approximately 8 a.m. on the morning  of admission, the patient had acute onset of severe substernal chest pain  which radiated to the left arm and back.  He took three sublingual  nitroglycerins with minimal relief and subsequently presented to [facility].  He was started on intravenous heparin,  nitroglycerin, as well as Integrilin.  The electrocardiogram at that time  showed some minor elevation in leads 2, 3, and aVF.  He was subsequently  transferred to [facility] for further evaluation.   The patient was seen on admission by Dola Argyle, M.D.  Dr. Ron Parker noted that  the ST changes in the inferior leads had appeared to resolve.  However, it  was noted that there were some biphasic T waves in the anterior leads.  Due  to the patient's risk factors and presentation, it was felt that urgent  catheterization was  indicated.   PAST MEDICAL HISTORY:  Significant for:  1. Borderline diabetes.  2. Hyperlipidemia.  3. Tobacco use.   PAST SURGICAL HISTORY:  Significant for:  1. Cholecystectomy.  2. Amputation of the right great toe, as well as the second toe.   SOCIAL HISTORY:  The patient lives in [place], [place], with his wife  and is disabled.  He has two children.  He has cut down his tobacco use to  approximately two cigarettes a day and consumes no excessive ETOH and no  illicit drugs.   FAMILY HISTORY:  Significant for early coronary artery disease in his mother  at age 55.   CARDIAC RISK FACTORS:  Known coronary artery disease, borderline diabetes,  hyperlipidemia, tobacco use, and positive family history.   MEDICATIONS:  1. Xanax 0.5 mg t.i.d. p.r.n.  2. Enteric-coated aspirin 325 mg b.i.d.  3. Lipitor 40  mg q.h.s.  4. Nitroglycerin patch 0.2 mg/hr to be changed q.d.   REVIEW OF SYMPTOMS:  Significant for chronic headache.  Also significant for  chest pain, shortness of breath, dyspnea on exertion, orthopnea, paroxysmal  nocturnal dyspnea, as well as palpitations.  Also significant for depression  and anxiety.  Otherwise the review of systems is unremarkable.   PHYSICAL EXAMINATION:  VITAL SIGNS:  Heart rate 80, blood pressure 117/56,  respiratory rate 20.  GENERAL APPEARANCE:  This is a 45 year old obese male, anxious and in  moderate distress.  HEENT:  The patient is normocephalic and atraumatic.  Pupils are equal,  round, and reactive to light and accomodation.  NECK:  Supple without lymphadenopathy, thyromegaly, bruit, or jugular venous  distention.  HEART:  Regular rate and rhythm.  Normal S1 and S2.  No S3 or S4  appreciated.  There are no murmurs, rubs, or gallops.  The PMI is normal.  Pulses are 2+ in all extremities and equal without bruits.  LUNGS:  Clear to auscultation without wheezing, rhonchi, or rales.  SKIN:  There are no obvious rashes or lesions.   RECTAL:  Exam is deferred.  ABDOMEN:  Soft and nontender.  There is no hepatosplenomegaly.  GENITOURINARY:  Exam is deferred.  EXTREMITIES:  No cyanosis, clubbing, or edema.  MUSCULOSKELETAL:  No obvious joint deformities or effusions.  NEUROLOGIC:  The patient is conscious, alert, and oriented x 3.  Cranial  nerves II-XII are grossly intact.  The remainder of the exam was nonfocal.   LABORATORY DATA:  The chest x-ray shows no acute disease.  The  electrocardiogram shows sinus rhythm at 74, axis 59, PR interval 115, QRS  94, QTC 426, and no Q waves.  Biphasic P waves are noted in leads V1 and V2.  There are no other notable changes.   White count 11.1, hemoglobin 16.5, hematocrit 48.1, platelets 232.  Sodium  140, potassium 3.6, chloride 105, CO2 28, BUN 12, creatinine 0.9, glucose  122.  CK 61, MB 1.0, troponin I 0.02.  PT 12.5, INR 1.0.   ASSESSMENT AND PLAN:  1. Chest pain.  While the inferior ST changes seen at [facility] are     now improved, the patient continues to have significant pain.  This is     despite treatment with nitrates, heparin, as well as Integrilin.  The     patient will be taken urgently to the catheterization lab for further     evaluation.  2. Hyperlipidemia.  Will check a fractionated lipid panel, as well as liver     function tests in the morning.  3.     Borderline diabetes mellitus.  Will check a hemoglobin A1C in the morning.  4. Continued tobacco use.  5. History of poor dietary compliance.  Will ask for a dietary consult for     further education.     Tad Moore, P.A. [facility]                  Dola Argyle, MD [facility]    CKM/MEDQ  D:  04/22/2002  T:  04/22/2002  Job:  [id number]   cc:   Gar Ponto  [address], [place] [address]  [place] [address]  Fax: [phone]   Satira Sark, M.D. [facility]

## 2010-11-30 NOTE — Discharge Summary (Signed)
NAME:  [name], [name]                    ACCOUNT NO.:  [id number]   MEDICAL RECORD NO.:  [id number]                   PATIENT TYPE:  INP   LOCATION:  2930                                 FACILITY:  [place]   PHYSICIAN:  Jenkins Rouge, M.D.                  DATE OF BIRTH:  11-01-1965   DATE OF ADMISSION:  02/10/2004  DATE OF DISCHARGE:  02/10/2004                                 DISCHARGE SUMMARY   PROCEDURES:  1. Cardiac catheterization.  2. Coronary arteriogram.  3. Left ventriculogram.   HOSPITAL COURSE:  [name] is a 45 year old male with known coronary  artery disease.  He had a Cypher stent to the LAD in October of 2003, which  was his last heart catheterization.  He has had a total of four  catheterizations.  He went to [facility] for left sided chest  pressure that radiated to his left arm.  It was similar to the symptoms  prior to his stent.  He decided to go to the emergency room on the night  before admission and was transferred to [facility] for further  evaluation and treatment.   It was felt that his symptoms were consistent with unstable anginal pain and  cardiac catheterization was indicated for evaluation.  The cardiac  catheterization showed diffuse disease in the RCA, up to 40%, but no in-  stent restenosis in the LAD and no significant disease.  His EF was 65% with  no regional wall motion abnormality and no AF or MR.  Dr. Albertine Patricia evaluated  [name] and felt that no further inpatient workup was indicated and he  had non-cardiac chest pain.  Pending completion of his bed rest with  ambulation, [name] is tentatively considered stable for discharge on  February 10, 2004 with outpatient follow-up arranged.   DISCHARGE CONDITION:  Stable.   DISCHARGE DIAGNOSES:  1. Chest pain, no critical coronary artery disease by cardiac     catheterization this admission.  2. History of cardiac catheterization in 2003 with a Cypher stent to the  left anterior descending artery.  3. Non-insulin-dependent diabetes mellitus.  4. Hyperlipidemia.  5. Hypertension.  6. History of sleep apnea on CPAP.  7. Status post tonsillectomy and adenoidectomy, cholecystectomy, dental     extractions and right toe amputation, secondary to trauma.  8. Family history of coronary artery disease.   DISCHARGE INSTRUCTIONS:  1. His activity level is to include no driving or strenuous activity for two     days.  2. He is to stick to a low-fat, diabetic diet.  3. He is to call the office for problems with the catheterization site.  4. He is to follow-up Roque Cash, P.A.C. for Dr. Domenic Polite on Friday, August     12 at 12:45 p.m.  5. He is to follow-up with his primary care physician, Dr. Quillian Quince, as well.   DISCHARGE MEDICATIONS:  1. Xanax 1 mg  as prior to admission.  2. Aspirin 325 mg q. day.  3. Neurontin 300 mg p.o. t.i.d.  4. Nitrostat p.r.n.  5. Vytorin 10/80 mg p.o. q. day.  6. Lexapro 20 mg p.o. q. day.  7. Lisinopril 20 mg p.o. q. day.  8. Toprol XL 100 mg p.o. q. day.  9. Plavix 75 mg p.o. q. day.  10.      Advair and albuterol inhalers and Flonase as prior to admission.  11.      He is hold Imdur.      Rosaria Ferries, P.A. [facility]                  Jenkins Rouge, M.D.    RB/MEDQ  D:  02/10/2004  T:  02/10/2004  Job:  [id number]   cc:   Gar Ponto  [address], [place] [address]  [place]  [place] [address]  Fax: [phone]

## 2010-11-30 NOTE — Discharge Summary (Signed)
NAME:  DOMYNIC, TINDAL          ACCOUNT NO.:  [id number]   MEDICAL RECORD NO.:  [id number]          PATIENT TYPE:  INP   LOCATION:  3732                         FACILITY:  [facility]   PHYSICIAN:  Shaune Pascal. Bensimhon, MDDATE OF BIRTH:  17-Jan-1966   DATE OF ADMISSION:  09/04/2006  DATE OF DISCHARGE:  09/07/2006                               DISCHARGE SUMMARY   PRIMARY CARDIOLOGIST:  Satira Sark, MD in [place], also Dr. Sherren Mocha in [place].   PRIMARY CARE PHYSICIAN:  Dr. Gar Ponto.   PROCEDURES PERFORMED DURING HOSPITALIZATION:  1. Cardiac catheterization completed by Dr. Sherren Mocha on September 04, 2006.      a.     Status post anterior myocardial infarction secondary to late       stent thrombosis.  2. Severe diffuse right coronary artery stenosis.  3. Mild left ventricular dysfunction in the setting of the patient's      acute myocardial infarction.      a.     The left main stem is angiographically normal.  It       bifurcates into the LAD and the left circumflex.  The LAD is       occluded in its proximal portion just beyond the first diagonal       branch.  This is in the proximal portion of a previously placed       stent.  There is no flow beyond the occlusion.  The first diagonal       branch is very large.  There is a 40% proximal stenosis noted in       that vessel.  The left circumflex is a large caliber and gives off       a large proximal obtuse marginal branch that is nonobstructive       plaque.  The second obtuse marginal also has nonobstructive       plaque.  There is a left posterior lateral branch supplied by the       left circumflex as well.  There is no high-grade obstructive       disease throughout the circumflex system.  The right coronary       artery is dominant.  There is a diffuse irregular plaque of at       least 75% stenosis involving a long segment of the proximal and       mid-right coronary artery.  Following the  high-grade stenotic       lesion, there is a 40% stenosis in the mid portion of the vessel.       The distal RCA has nonobstructive plaque and then gives off a       right PDA which also has a nonobstructive plaque.  Left       ventriculography performed in 30 degrees to the right anterior       oblique projection demonstrates akinesis of the apex.  Other       segments in the left ventricle are hyperdynamic.  Overall LVEF is       estimated at 50%.   PRIMARY DIAGNOSES  ON DISCHARGE:  Acute anterior ST elevation myocardial  infarction.  A.  Status post coronary angiography as stated above.  B.  April 22, 2002, percutaneous coronary intervention and stenting of  the left anterior descending with placement of a 3.5 x 18-mm Cipher drug-  eluting stent.  C.  August 19, 2005, cardiac catheterization.  Left main normal, left  anterior descending 30% in-stent, V1 40%, proximal left circumflex  normal, obtuse marginal-1 30%, obtuse marginal-2 50%, right coronary  artery 50 to 60% proximal, 50% distal.  1. Hypertension.  2. Residual ST elevation on EKG.   SECONDARY DIAGNOSES:  1. Hyperlipidemia.  2. Type 2 diabetes.  3. Obstructive sleep apnea.  4. Tobacco abuse.  5. Peridental disease status post multiple tooth extractions.  6. Anxiety and depression.   HISTORY OF PRESENT ILLNESS:  This is a 45 year old morbidly obese  Caucasian male with a prior history of CAD who presents to the emergency  room secondary to intense substernal chest pain with shortness of breath  with associated diaphoresis.  The patient had called EMS, and EKG showed  anterior ST segment elevation.  The patient received sublingual  nitroglycerin sprays without relief.  A Code STIMI was activated, and  the patient was taken emergently to the [facility].   HOSPITAL COURSE:  The patient was seen and examined by Dr. Sherren Mocha, and an emergency cardiac catheterization was completed.  The   patient had a PCI of the LAD performed using IVUS guidance.  The  patient's stent thrombosis was treated with balloon angioplasty alone  with good angiographic result.  It is planned that the patient will  return for a staged procedure to treat his right coronary artery.  The  patient was moved to ICU and remained on Integrilin for 24 hours.  The  patient had no recurrence of chest pain and was moved to stepdown unit  the following day.  The patient was very anxious and ready to go home.  He did not wish to stay any longer.  He was without chest pain or  shortness of breath the following 48 hours.  The patient was given  smoking cessation instructions along with need to continue Plavix  lifelong.  The patient was seen throughout hospitalization by Dr. Glori Bickers as well, and medications were adjusted.   The patient was started on lisinopril 40 mg a day, and captopril was  discontinued.  The patient was started on Coreg 6.25 mg twice a day, and  metoprolol was discontinued.  The patient tolerated these medications  well without any side effects and was up and about in the room without  difficulty.  EKG revealed residual ST elevation anteriorly and without  evidence of arrhythmias seen.  The patient's heart rate remained in the  70s and 80s post procedure.   On the day of discharge, the patient was seen and examined by Dr. Glori Bickers and was found to be stable for discharge.  The patient was  anxious to go home and was advised that he will return to see Dr. Burt Knack  in a couple of week for discussion and need for intervention to his  right coronary artery.  He verbalized understanding and states that he  will rest at home and do his best on smoking cessation.   DISCHARGE LABORATORY DATA:  Sodium 138, potassium 3.4, chloride 104, CO2  24, BUN 13, creatinine 0.9, glucose 125, BNP 149; cholesterol 150, triglycerides 121, HDL 25, LDL  111; hemoglobin 13.4, hematocrit 38.5,  white  blood cells 12.0, platelets 497.  CK 4057, 2403, 1740,  respectively; MB 300, 138, and 93.3, respectively; troponin 100, 78.85,  54.5, respectively.   Blood pressure on discharge 143/85, heart rate 70, respirations 20, his  weight was 262.6 pounds.   FOLLOWUP APPOINTMENT AND PLANS:  1. The patient will follow up with Dr. Sherren Mocha in two weeks.      As this is weekend, the office will call to make a followup      appointment.  2. The patient has been given postcardiac catheterization instructions      with particular references to his right groin for evidence of      bleeding, hematoma, or infection.  3. The patient is advised on smoking cessation.  4. The patient is not to return to work until see by Dr. Burt Knack, and      plan is made for intervention of the right coronary artery.  5. The patient has been given instructions on new medications that he      is to take at home to include lifelong Plavix, lisinopril, and      Coreg.   DISCHARGE MEDICATIONS:  1. Plavix 75 mg once a day.  2. Aspirin 325 mg once a day.  3. Vytorin 10/40 once a day.  4. Gabapentin 300 mg t.i.d.  5. Xanax 1 mg at bedtime.  6. Lexapro 20 mg daily.  7. Advair 250/50 twice a day.  8. Coreg 6.25 twice a day.  9. Lisinopril 40 mg daily.  10.Nitroglycerin 0.4 mg sublingual p.r.n. pain.   ALLERGIES:  ALEVE.   TIME SPENT WITH THE PATIENT TO INCLUDE PHYSICIAN TIME:  Forty-five  minutes.      Phill Myron. Purcell Nails, NP      [place] Bensimhon, MD  Electronically Signed    KML/MEDQ  D:  09/07/2006  T:  09/07/2006  Job:  [id number]   cc:   Gar Ponto

## 2010-11-30 NOTE — Discharge Summary (Signed)
NAME:  [name], [name]                    ACCOUNT NO.:  [id number]   MEDICAL RECORD NO.:  [id number]                   PATIENT TYPE:  INP   LOCATION:  2910                                 FACILITY:  [facility]   PHYSICIAN:  Ernestine Mcmurray, M.D. [facility]             DATE OF BIRTH:  [birth year]   DATE OF ADMISSION:  DATE OF DISCHARGE:  10/12/2002                                 DISCHARGE SUMMARY   DISCHARGE DIAGNOSES:  1. Noncardiac chest pain.  2. Coronary artery disease.     A. Status post stent to the left anterior descending artery in October        2003 with a Cypher stent.     B. Nondiagnostic Cardiolite recently with an ejection fraction of 52%.  3. Hyperlipidemia.  4. Tobacco abuse.  5. Diet controlled type 2 diabetes mellitus.   PROCEDURES:  Cardiac catheterization by Dr. Bing Quarry on October 12, 2002, revealing an ejection fraction of 67%, RCA with mild diffuse disease,  LAD with patent stent and mild disease with 30% stenosis mid vessel,  circumflex with mild diffuse disease.   HOSPITAL COURSE:  Please refer to the consultation noted done at [facility] by Dr. Johnny Bridge on October 07, 2002, for complete  details. Briefly this is a 45 year old male with a history of CAD who was  seen in consultation for recurrent chest pain. It was decided that  he  should be transferred to [facility] for further evaluation to  include cardiac catheterization.   He arrived at [facility] on October 10, 2002, and he remained stable  through the weekend. Due to scheduling difficulties, he did not have a  cardiac catheterization until October 12, 2002. This was done without  complication on October 12, 2002. He tolerated the procedure well.   Post catheterization his groin was stable without hematoma or bruit. The  catheterization results are as noted above. There was some increased  calcification noted in the tricuspid area. Dr. Lia Foyer could not rule out  right ventricular outflow tract and suggested a 2D echocardiogram to  evaluate. The patient was  eager to go home and it was decided that this  could be safely performed on an outpatient basis.   The patient has been advised again to discontinue cigarettes. He should  followup with his primary care physician to discuss initiation or oral  hypoglycemic medications for his type 2 diabetes mellitus.   LABORATORY DATA:  White blood cell count 8900, hemoglobin 13.2, hematocrit  37.9, MCV 87.6, platelet count 168,000. INR 1.0. Sodium 137, potassium 3.4,  chloride 106, CO2 26, glucose 102, BUN 12, creatinine 1.0. Total bilirubin  0.7, alkaline phosphatase 53, AST 25, ALT 62, total protein 6.1, albumin  3.4, calcium 8.5. Hemoglobin A1C 5.3. Amylase 88, lipase 38. Cardiac enzymes  negative x6. Total cholesterol 166, triglycerides 67, HDL 31, LDL 81.   DISCHARGE MEDICATIONS:  1. Plavix 75  mg daily.  2. Enteric coated aspirin 325 mg daily.  3. Lipitor 40 mg q.h.s.  4. Toprol XL 50 mg daily.  5. Wellbutrin XL 300 mg daily.  6. Xanax.  7. Nitroglycerin p.r.n. chest pain.   DISCHARGE INSTRUCTIONS:  Activity, avoid heavy lifting, exertion, work or  sex for 2 days, advance as tolerated. Low fat, low sodium diet. Wound care,  call our office for any groin swelling, bleeding or bruising. The patient is  to have a 2D echocardiogram performed in the office in [place]  and the office  should call him with an appointment. He should also see Dr. Domenic Polite in the  next 2 weeks and the office will contact him.     [name], P.A.                        Ernestine Mcmurray, M.D. [facility]    SW/MEDQ  D:  10/12/2002  T:  10/12/2002  Job:  [id number]   cc:   Gar Ponto  [address], [place] [address]  [place] [address]  Fax: [phone]   Satira Sark, M.D. [facility]

## 2010-11-30 NOTE — Discharge Summary (Signed)
NAME:  [name], [name] NO.:  [id number]   MEDICAL RECORD NO.:  [id number]                   PATIENT TYPE:  INP   LOCATION:  3703                                 FACILITY:  [facility]   PHYSICIAN:  Dola Argyle, MD [facility]                DATE OF BIRTH:  08/17/1965   DATE OF ADMISSION:  04/22/2002  DATE OF DISCHARGE:  04/24/2002                           DISCHARGE SUMMARY - REFERRING   HISTORY OF PRESENT ILLNESS:  The patient is a 45 year old white male with  known coronary artery disease.  At approximately 8 a.m. on the morning of  admission he had acute onset of severe substernal chest discomfort radiating  to the left arm and back.  He took three sublingual nitroglycerin with  minimal relief, and he presented to [facility] ER.  He was started on  IV heparin, nitroglycerin, as well as Integrilin.  Initial EKG showed minor  elevations in leads II, III, and aVF, and he was transferred to [facility] for  further evaluation.  On arrival, EKG changes had resolved, but he had  biphasic P-waves in the anterior leads.  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   LABORATORY DATA:  Laboratories done at [facility] show a sodium of  140, potassium 3.6, BUN 12, creatinine 0.9, glucose 122.  CK total 61, MB  1.0, troponin 0.02.  PT 12.5, PTT 23.8.  H&H 16.5 and 48.1, normal indices,  platelets 232, WBC 11.1.  Fasting lipids performed at [facility] showed a  total cholesterol of 173, triglycerides of 390, HDL low at 28, LDL at 67.  Hemoglobin A1C was 5.4.   Chest x-ray at [facility] did not show any active disease.   As previously described, EKG at [facility] showed slight elevation  inferiorly  with resolution on subsequent EKGs.   HOSPITAL COURSE:  The patient was transferred to [facility].  He  went directly to the catheterization laboratory.  Dr. Olevia Perches performed  cardiac catheterization on April 22, 2002.  According to his progress note,  he had a 70% proximal LAD, 40% proximal circumflex, 30% OM 2, multiple  irregularities in the circumflex, 30% proximal RCA, 30% mid RCA, with  multiple irregularities.  EF of 60%.  Dr. Olevia Perches performed angioplasty  stenting reducing the LAD lesion from 70% to less than 10%.  He recommended  Plavix for at least three months.  Post sheath removal and bed rest he was  ambulating in the hall without difficulty.  Dr. Olevia Perches had recommended  keeping him until Saturday.  Cardiac rehabilitation assisted in education.  He also had a dietary consult.  On April 24, 2002, after review by Dr.  Domenic Polite, he felt that he could be discharged home.   DISCHARGE DIAGNOSES:  1. Unstable angina, status post angioplasty stenting of the left anterior     descending, as previously described.  2. Residual coronary disease, as previously described.  3. Hypertension.  4. Hyperlipidemia.  5. Tobacco use.  6. Borderline diabetes.  7. Obesity.   DISPOSITION:  He is discharged home.   MEDICATIONS:  He received prescriptions for new medications of Plavix 75 mg  q.d. for six months, Toprol XL 25 mg q.d.  He was asked to continue coated  aspirin 325 mg q.d., Lipitor 40 mg q.h.s., nitroglycerin 0.4 as needed, and  Xanax 0.5 t.i.d. as needed.   DISCHARGE INSTRUCTIONS:  He was advised no lifting, driving, sexual  activity, or heavy exertion for two days.  Maintain low-salt, fat,  cholesterol, ADA diet.  If he had any problems with the catheterization site  he was asked to call immediately.  He was advised no smoking or tobacco  products.  He was asked to call the [facility] office on Monday to arrange a two-  week appointment with Dr. Domenic Polite.  At  that time Dr. Domenic Polite will review  his lipid panel and consider adding Lopid or Zetia .  He was hesitant to  increase his Lipitor because his ALT has always been slightly elevated.  During this admission it was 34.     Sharyl Nimrod, P.A. [facility]                    Dola Argyle, MD [facility]    EW/MEDQ  D:  04/24/2002  T:  04/26/2002  Job:  [id number]   cc:   Satira Sark, M.D. [facility]  [address] [place]  [place] [address]  Fax: [phone]

## 2010-11-30 NOTE — H&P (Signed)
[place]  Patient:    [name], [name] Visit Number: [id number] MRN: [id number]          Service Type: MED Location: [id number] Attending Physician:  Satira Sark Dictated by:   Nikki Dom, M.D., [facility] Admit Date:  12/30/2001 Discharge Date: 12/31/2001   CC:         Dr. Gar Ponto in [place]   History and Physical  CHIEF COMPLAINT:  The patient presents to the emergency room with chest pain.  HISTORY OF PRESENT ILLNESS:  The patient is a 45 year old obese, tobacco-abusing, hypercholesterolemic, nonworking male who has a premature family history of coronary disease who has had repeated catheterizations over the last three years because of chest pain.  The first followed a falsely abnormal Cardiolite scan.  He has been treated medically for the intervening years, although he has continued to smoke and not been on statin therapy.  He was catheterized again in early May because of recurrent chest pain and was found to have a lesion in his LAD that had progressed somewhat so that it was of 60% by area and 39% by diameter by IVUS.  It was not felt to be significant although the question was raised that if pain persisted, consideration should be given to a drug-eluding stent.  The patient now returns with five hours of pain.  The pain is nonexertional. The pain does not change with position.  It is not aggravated by activity.  It is not associated with a brackish taste.  The patient continues to smoke two packs a day.  His LDL was last assessed in 2001 (which I have record of).  LDL was 110, his HDL was 35.  He continues to gain weight.  He has now "borderline diabetes" and he has a 40 inch waist at 252 pounds and 5 feet 11 inches tall.  PAST MEDICAL HISTORY:  Notable as above.  In addition, he has traumatic amputation of his toes from a lawn mower which seems to get in the way of him working.  He is status post  cholecystectomy and tonsil/adenoidectomy.  PRESENT MEDICATIONS: 1. Effexor 150. 2. Wellbutrin 150 b.i.d. 3. ______ b.i.d. 4. Aspirin. 5. Nitroglycerin patch 0.2.  ALLERGIES:  None.  SOCIAL HISTORY:  He lives with his wife.  He has two children ages three and four whom he cares for and his wife works in [employer].  REVIEW OF SYSTEMS:  Notable for chronic headache, depression and anxiety, is otherwise negative.  PHYSICAL EXAMINATION:  GENERAL:  He is an obese Caucasian male in no acute distress.  VITAL SIGNS:  Blood pressure 143/55, pulse 90, respirations 18 and unlabored.  HEENT:  Demonstrated no scleral icterus, no xanthoma.  His neck veins were flat.  His carotids were brisk and full bilaterally without bruits.  BACK:  Without kyphosis or scoliosis.  LUNGS:  Clear.  HEART:  Regular without murmurs or gallops.  ABDOMEN:  Soft with active bowel sounds, without midline pulsation or hepatomegaly.  PULSES:  Femoral pulses were 2+, distal pulses were intact.  EXTREMITIES:  There is no clubbing, cyanosis, or edema.  NEUROLOGIC:  Grossly normal although the patients affect was flat and sad.  LABORATORY DATA:  Electrocardiogram dated today demonstrated sinus rhythm at 97 with intervals of 0.12/0.10/0.34 with an axis of 83 degrees.  There are nonspecific ST-T changes primarily in the inferolateral leads and he has an R prime in lead V1.  IMPRESSION: 1. Chest pain  with typical and atypical features. 2. Coronary artery disease with at least three catheterizations in the last    three years, most recently in May 2003 demonstrated a 60% by area and 39%    by diameter lesion in the LAD by IVUS. 3. Cardiac risk factors including:    a. Ongoing smoking of two packs per day.    b. Hyperlipidemia.    c. Obesity.    d. "Borderline diabetes." 4. Depression and anxiety. 5. Not working.  The patient has recurrent chest pain.  I think there are likely a multitude of factors  contributing to this in addition to the objective findings noted at catheterization noted last month.  Smoking cessation is important.  He says he cannot afford his gum but I worked out the numbers for him and he actually smokes almost as much as the Nicorette gum costs per month.  He does not take statin therapy and he continues to gain weight, as he is not able to work because of not feeling well.  I will defer the plan for his catheterization to Dr. Percival Spanish and Dr. Lia Foyer who spent much time considering this his last admission.  I will treat him with Lovenox and check his enzymes tonight.  I will also begin him on statin therapy.  Smoking cessation will be essential as well in addition to cardiac rehabilitation to support weight loss. Vocational rehab may also be of benefit.  The patient does not read or write. He relates this to dyslexia and short-term memory difficulties, but it clearly impairs his ability to work, which almost certainly aggravates his depression and certainly may be contributing to his obesity. Dictated by:   Nikki Dom, M.D., [facility] Attending Physician:  Satira Sark DD:  12/30/01 TD:  12/31/01 Job: [id number]

## 2010-11-30 NOTE — Cardiovascular Report (Signed)
[place]  Patient:    [name], [name] Visit Number: [id number] MRN: [id number]          Service Type: CAT Location: [id number] Attending Physician:  Wadie Lessen Dictated by:   Minus Breeding, M.D. [facility] Proc. Date: 11/13/01 Admit Date:  11/13/2001 Discharge Date: 11/14/2001   CC:         [name], M.D.  [facility]   Cardiac Catheterization  DATE OF BIRTH: 05/25/1966  PRIMARY PHYSICIAN: [name], M.D.  PROCEDURE: Left heart cardiac catheterization/coronary arteriography.  INDICATIONS: Evaluate patient with chest pain (possibly unstable angina) and known coronary disease (nonobstructive on previous catheterizations).  DESCRIPTION OF PROCEDURE: Left heart catheterization was performed via the right femoral artery.  The artery was cannulated using an anterior wall puncture.  A #6 French arterial sheath was inserted via the modified Seldinger technique.  Preformed Judkins and a pigtail catheter were utilized.  The patient tolerated the procedure well and left the lab in stable condition.  RESULTS:  HEMODYNAMICS: LV 129/23, AO 130/82.  CORONARY ARTERIOGRAPHY: Left main was normal.  The LAD had proximal 50% stenosis with slight haziness.  The circumflex had proximal 25% stenosis and diffuse luminal irregularities. The circumflex was a codominant vessel.  The right coronary artery was codominant. There was proximal 40% stenosis with haziness and diffuse luminal irregularities in the remainder of the vessel.  LEFT VENTRICULOGRAM: The left ventriculogram was obtained in the RAO projection.  The EF was 60% with normal wall motion.  CONCLUSION: Diffuse coronary artery plaquing. There appeared to be nonobstructive lesions. However, there is haziness in both the LAD and RCA proximal stenosis. It could denote more severe obstruction.  PLAN: I will review the films with Dr. Lia Foyer. We will consider  intravascular ultrasound. The patient needs to stop smoking. We will also start him on a statin. Dictated by:   Minus Breeding, M.D. [place] Attending Physician:  Wadie Lessen DD:  11/13/01 TD:  11/16/01 Job: [id number]

## 2010-11-30 NOTE — Cardiovascular Report (Signed)
NAME:  [name], RUDNIK          ACCOUNT NO.:  [id number]   MEDICAL RECORD NO.:  [id number]           PATIENT TYPE:   LOCATION:                               FACILITY:  [facility]   PHYSICIAN:  Loretha Brasil. Lia Foyer, MD, FACCDATE OF BIRTH:  March 14, 1966   DATE OF PROCEDURE:  DATE OF DISCHARGE:                            CARDIAC CATHETERIZATION   INDICATIONS:  Mr. Anglade is a 45 year old who previously has undergone a  prior percutaneous coronary intervention.  A stent was placed in the LAD  in 2003.  The patient recently developed restenosis and re-closure of  the LAD stent.  He had progressive disease in the right coronary artery.  The LAD stent was reopened by Dr. Burt Knack, and the patient was to be  brought back in for an intervention of his right coronary artery  electively, but developed recurrent pain and arrived in the cath lab.  He was seen by Dr. Olevia Perches this morning and set up for intervention of  the right coronary artery.  I also discussed the case with the patient.  I reviewed the films.  He was agreeable to proceed.   PROCEDURE:  1. Femoral arteriography.  2. Left coronary arteriography.   DESCRIPTION OF PROCEDURE:  The patient was brought to the  catheterization laboratory and prepped and draped in the usual fashion.  Through an anterior puncture using a Smart needle, we entered the left  femoral artery and the left femoral artery was easily entered with a 6-  [place] sheath.  A femoral arteriogram was then performed in anticipation  of closure.  The patient had received 3 mg of intravenous Versed.  Following this, a JL 3/5 guiding catheter was then taken to the central  aorta and views of the left coronary artery obtained.  During this, the  patient subsequently developed some itching, he developed a drop in his  blood pressure into the 40 systolic range, and developed a rash  basically from the neck down to the waist.  The patient became restless.  The procedure was  discontinued and the pressure hooked up to the femoral  sheath.  The patient appeared to be having a contrast reaction.  Intravenous Benadryl was given at 50 mg, intravenous Solu-Medrol at 125  mg, and a dopamine drip was started 1/2 amp of intravenous epinephrine  was also given.  Within the next few minutes, the patient developed an  increase in blood pressure to over 123XX123 systolic.  His rash eventually  resolved.  His O2 sats which were in the 70s increased on 100% O2, up to  above 95%.  We kept him in the laboratory and Dr. Olevia Perches was also in the  laboratory with [place].  We kept in the laboratory for approximately 45  minutes, and his overall situation began to improve.  He was  subsequently taken to the holding area in satisfactory clinical  condition.  He was thought to have an anaphylactic reaction to contrast.   ANGIOGRAPHIC DATA:  1. The left main is mildly irregular but free of critical disease.      The LAD courses to the apex in the area of  the stent which had been      dilated few weeks ago.  There is about 30% narrowing.  The diagonal      appears to have about a 60% area of narrowing.  2. There is a ramus intermedius without critical disease.  3. The AV circumflex which is a codominant vessel, had about 50%      proximal stenosis.   The femoral angiogram demonstrates a good entry site well above the  bifurcation just medial to the middle of the femoral head.   CONCLUSION:  Aborted percutaneous coronary intervention of the right  coronary artery due to what appears to be an anaphylactic reaction to x-  ray contrast.   DISPOSITION:  The patient will be put back in the coronary care unit.  He will be kept on oxygen and fluids.  He will be monitored closely.  He  will probably return to the laboratory on Monday for a definitive  procedure.      Loretha Brasil. Lia Foyer, MD, [facility]  Electronically Signed     TDS/MEDQ  D:  09/19/2006  T:  09/19/2006  Job:  [id number]   cc:    Juanda Bond. Burt Knack, MD

## 2010-11-30 NOTE — Assessment & Plan Note (Signed)
[facility] OFFICE NOTE   NAME:[name], [name]                 MRN:          [id number]  DATE:10/15/2006                            DOB:          [birth year]    PRIMARY CARE PHYSICIAN:  Dr. Gar Ponto   REASON FOR VISIT:  Follow up coronary artery disease.   HISTORY OF PRESENT ILLNESS:  [name] comes in for a post-hospital  visit.  I have actually not seen him in the office for quite some time.  He is a 45 year old male with previous history of drug eluting stent  placement to the left anterior descending in 2003, and was enrolled in  the Stradivarius trial.  Most recently, he presented to the hospital  with an acute ST elevation myocardial infarction in February of this  year, due to in-stent thrombosis of the left anterior descending and  underwent angioplasty of this lesion with good angiographic result.  Medications were adjusted and smoking cessation recommended.  He was  noted to have residual coronary artery disease, including 40% diagonal  stenosis, nonobstructive plaque within the circumflex, and a 75% long,  irregular stenosis of the proximal and mid right coronary artery.  Ejection fraction was 50% at that time.  This was initially managed  medically and he was ultimately readmitted to the hospital with  recurrent chest pain.  Plans at that time were for intervention to  address the right coronary artery stenosis.  However, he developed an  apparent anaphylactic reaction during the initial angiography prior to  proceeding with intervention to the right coronary artery.  He was  treated appropriately and improved, although did not undergo  intervention at that time.  The situation was discussed with the patient  and his wife, per Dr. Burt Knack, and he was referred for a followup  outpatient Cardiolite, which was performed on March 20.  This study was  abnormal, revealing an ejection fraction  of 42% with anteroapical and  periapical and inferoapical akinesis.  Perfusion data suggested a  combination of scar with some ischemia in this distribution.   [name] comes into the clinic today and we discussed his recent  presentation, as well as subsequent symptoms.  He has had some sharp  intermittent chest pain since that time, requiring nitroglycerin, two  occasions he described as being significant.  He has not had to proceed  to the hospital, however.   I reviewed his medications and the implications of his recent stress  testing.  It is certainly possible that the recent left anterior  descending thrombosis has resulted in much of the abnormality noted on  the Cardiolite, particularly anteroapical and periapical distribution.  It is hard to say that some potential ischemia in the inferoapical wall  is not related to the right coronary artery, although the entire wall  does not exhibit frank ischemia.  We discussed a number of issues today,  including further optimization of his medical therapy and observation  prior to referring him back for a reattempt at intervention,  particularly in light of his prior complications.  He is actually  somewhat nervous  about proceeding with the heart catheterization and  would prefer to try further medication adjustments for the time being.  I discussed with him the pathophysiology of coronary artery disease,  indications for stent placement, and rationale for medical therapy, as  it relates to risk reduction of both ischemia and more serious cardiac  events.   ALLERGIES:  ALEVE and CONTRAST DYE (recently noted).   PRESENT MEDICATIONS INCLUDE:  1. Lisinopril 20 mg p.o. daily.  2. Vytorin 10/80 mg p.o. daily.  3. Alprazolam 1 mg p.o. q.i.d.  4. Enteric coated aspirin 325 mg p.o. daily.  5. Advair 250/50 b.i.d.  6. Flonase 50 micrograms two sprays daily.  7. Imdur 60 mg p.o. daily.  8. Plavix 75 mg p.o. daily.  9. Coreg 6.25 mg p.o.  b.i.d.  10.Niaspan 500 mg p.o. b.i.d.  11.Actos 45 mg p.o. daily.  12.He also has nitroglycerin 0.4 mg sublingual.  13.Albuterol inhaler p.r.n.   REVIEW OF SYSTEMS:  As described in History of Present Illness.  He has  had no bleeding problems.   EXAMINATION:  Blood pressure is 134/78, heart rate is 97, weight is 270  pounds.  This is an obese male, in no acute distress, denying any active  chest pain.  HEENT:  Conjunctiva was normal, oropharynx clear.  NECK:  Supple without elevated jugular venous pressure or loud bruits.  No thyromegaly is noted.  LUNGS:  Clear without labored breathing.  CARDIAC EXAM:  Reveals a regular rate and rhythm, no pericardial rub, no  S3 gallop.  ABDOMEN:  Soft, nontender.  EXTREMITIES:  Exhibit no marked pitting edema.  Distal pulses are 1+.  SKIN:  Warm and dry.  MUSCULOSKELETAL:  No kyphosis.  NEUROPSYCHIATRIC:  Patient is alert and oriented times three.   IMPRESSION AND RECOMMENDATIONS:  1. Coronary artery disease, as outlined above, including a recent      anterior wall myocardial infarction, due to in-stent thrombosis      within the left anterior descending that was treated with      angioplasty.  Residual disease includes a long, diffuse, 75%      stenosis in the right coronary artery that was considered for re-      intervention recently, although the procedure was aborted, due to      an apparent anaphylactic reaction to contrast dye.  At this point,      followup Cardiolite is abnormal, predominantly in the anteroapical      distribution, but also inferoapical distribution, as evidenced by      combination of scar and peri-infarct ischemia.  I reviewed all of      this with the patient and, at this point, our plan is to further      advance medical therapy prior to referring him back for potential      intervention.  We will increase his Coreg to 12.5 mg p.o. b.i.d.     and Imdur to 60 mg p.o. q.a.m. and 30 mg p.o. q.p.m.  I will have       him follow up with me over the next three to four weeks and we can      proceed from there.  Clearly, if he has worsening symptoms in the      interim, we may need to look towards potential intervention.  Of      note, I must say that, in reviewing the chart, Mr. Dodaro has had      significant problems with recurrent chest pain  over the years, not      all associated with obstructive coronary stenoses.  This must also      be considered in his long-term management.  2. Further plans to follow.     Satira Sark, MD  Electronically Signed    SGM/MedQ  DD: 10/15/2006  DT: 10/15/2006  Job #: [id number]   cc:   Gar Ponto

## 2010-11-30 NOTE — Op Note (Signed)
   NAME:  [name], [name]                    ACCOUNT NO.:  [id number]   MEDICAL RECORD NO.:  [id number]                   PATIENT TYPE:  OIB   LOCATION:  2860                                 FACILITY:  [facility]   PHYSICIAN:  Kathlene November., D.D.S.        DATE OF BIRTH:  [birth year]   DATE OF PROCEDURE:  07/06/2002  DATE OF DISCHARGE:  07/06/2002                                 OPERATIVE REPORT   PREOPERATIVE DIAGNOSIS:  Advanced periodontal disease and dental caries.   POSTOPERATIVE DIAGNOSIS:  Advanced periodontal disease and dental caries.   PROCEDURE:  Extraction of all remaining teeth and maxillary and mandibular  alveoloplasty.   ANESTHESIA:  General via nasoendotracheal intubation.   DESCRIPTION OF PROCEDURE:  The patient was brought to the operating room in  stable preoperative condition and placed on the operating table in the  supine position.  Following successful induction of general anesthesia via  nasal endotracheal intubation, the patient was prepped and draped in the  usual sterile fashion for a procedure of this type.  Next, the oral cavity  was thoroughly irrigated with normal saline and suctioned dry and an  oropharyngeal throat pack was placed which was removed prior to the  conclusion of the case.  Following this, the maxilla and mandible was then  anesthetized with local anesthetic.  Approximately 7 cc of a 2% lidocaine  solution with 1:100,000 epinephrine was infiltrated into the maxillary and  mandibular soft tissues.  After allowing adequate time for local anesthesia  and hemostasis, all remaining teeth were extracted with surgical technique.  The teeth removed were #3, 4, 5, 6, 7, 8, 9, 10, 11, 12, 13, 14, 15, 18, 19,  20, 21, 22, 23, 24, 25, 26, 27, 28, 29, 30, and 31.  Full thickness  mucoperiosteal flaps were then elevated on the buccal surfaces of the  maxilla and mandible and full mouth four quadrant alveoloplasty was then  completed with  rongeurs and bone rasps.  The operative sites were thoroughly  irrigated and suctioned dry removing all bone fragments.  The soft tissues  were then reapproximated and sutured with 4-0 chromic gut sutures.  This  completed the procedure. The oral cavity was again thoroughly irrigated and  suctioned dry and the throat pack was removed.  All needle, sponge, and  instrument count correct at the conclusion of the case. The patient was  allowed to recover from general anesthesia and was transported to the  postanesthesia care unit in satisfactory postoperative condition.                                               Kathlene November., D.D.S.    JWB/MEDQ  D:  07/14/2002  T:  07/14/2002  Job:  [id number]

## 2010-11-30 NOTE — Cardiovascular Report (Signed)
[facility]  Patient:    [name], [name]                   MRN: [id number] Proc. Date: 09/26/99 Attending:  Christy Sartorius, M.D. [facility] CC:         Christy Sartorius, M.D. [facility]             [name], M.D.                        Cardiac Catheterization  PROCEDURES PERFORMED: 1. Left heart catheterization. 2. Left ventriculogram.  DIAGNOSES: 1. Mild coronary artery disease by angiogram. 2. Normal left ventricular systolic function.  INDICATIONS:  [name] is a 45 year old white male with substernal chest discomfort, who presents with increasing symptoms.  The patient had undergone cardiac catheterization on July 27, 1998, at which time he had mild disease. He has had recurrence of discomfort with radiation into the left neck, nausea, vomiting, and shortness of breath.  He has also had palpitations and dyspnea on  exertion and with his multiple cardiac risk factors he was admitted to the hospital to rule out acute myocardial infarction.  He subsequently ruled out by enzymes,  although he had mild T wave inversion in the lateral leads.  He presents now for left heart catheterization.  TECHNIQUE:  After informed consent was obtained, the patient was brought to the  cardiac catheterization lab where both groins were sterilely prepped and draped. Lidocaine 1% was used to infiltrate the right groin, and a 6 French sheath placed into the right femoral artery using the modified Seldinger technique.  A 6 [place] JL4 catheter was used to engage the left coronary artery and selective angiography performed in various projections using manual injections of contrast.  A JR4 catheter could not be seated appropriately in the right coronary artery and a special right catheter was used to engage the right coronary artery.  Selective  angiography was then performed in various projections using manual injections of contrast.  A 6 French pigtail was then  advanced to the left ventricle and left ventriculogram performed using power injections of contrast.  At the termination of the case, the catheters and sheaths were removed and manual pressure applied until adequate hemostasis was achieved.  The patient tolerated the procedure well and was transferred to the floor in stable condition.  FINDINGS: 1. Left main trunk:  Mild irregularities. 2. Left anterior descending:  This is a large caliber vessel that provides a    large first diagonal branch in the proximal segment.  There is a focal    eccentric narrowing of 30% in the mid section of the LAD.  The remainder    of the vessel has mild irregularities. 3. Left circumflex artery:  This is a large caliber vessel that provides a large    first marginal branch in the proximal segment and second marginal branch in he    mid section as well as two small marginal branches distally.  The left    circumflex system has mild irregularities. 4. Right coronary artery:  The right coronary artery is dominant.  This is a    medium caliber vessel that provides the posterior descending artery in its    terminal segment.  The right coronary artery has a long area of diffuse disease    in the mid section of 30%.  LEFT VENTRICULOGRAM:  Normal end-systolic and end-diastolic dimensions. Overall left ventricular function is well-preserved  with an ejection fraction of greater than 65%.  No mitral regurgitation.  LV pressure is 130/0, aortic 130/80, LVEDP  equals 25.  ASSESSMENT AND PLAN:  [name] is a 45 year old gentleman with noncritical coronary artery disease.  This will be medically management and other causes of his chest pain will be investigated. DD:  09/26/99 TD:  09/26/99 Job: [id number]

## 2010-11-30 NOTE — Discharge Summary (Signed)
NAME:  [name], [name]          ACCOUNT NO.:  [id number]   MEDICAL RECORD NO.:  [id number]          PATIENT TYPE:  INP   LOCATION:  3712                         FACILITY:  [facility]   PHYSICIAN:  Juanda Bond. Burt Knack, MD  DATE OF BIRTH:  [birth year]   DATE OF ADMISSION:  09/19/2006  DATE OF DISCHARGE:  09/22/2006                               DISCHARGE SUMMARY   PRIMARY DIAGNOSIS:  Chest pain, known coronary artery disease, medical  therapy.   SECONDARY DIAGNOSES:  1. Anaphylactic reaction to contrast media at catheterization.  2. Acute ST elevation myocardial infarction, 09/04/2006, with in-stent      thrombosis, apical left anterior descending (LAD) thrombosis as      well.  3. Residual coronary artery disease in the right coronary artery (RCA)      at 75% in the midportion.  4. Mild ischemic cardiomyopathy with an ejection fraction of 50%.  5. Diabetes.  6. Status post drug-eluting stent to the left anterior descending      (LAD) in 2003.  7. Hyperlipidemia.  8. Hypertension.  9. Obstructive sleep apnea, on CPAP.  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  11.Family history of coronary artery disease.  12.Ongoing tobacco use  13.Depression/anxiety  14.Status post EGD in 2001, which was normal.   HOSPITAL COURSE:  [name] is a 45 year old male with a history of  coronary artery disease, recent history of ST elevation MI for in-stent  thrombosis in the LAD.  He had balloon angioplasty at that time, and the  disease in the RCA had progressed, but a staged intervention was felt  indicated.  He developed recurrent chest pain and went to [facility], where he was evaluated and transferred to [facility] for  further treatment.   His cardiac enzymes were negative for MI.  It was felt that he needed  percutaneous intervention on the RCA, and he was taken to the lab for  this on  09/19/2006.  During the procedure, a diagonal was noted to have  a 60% stenosis, and the LAD had less than 30% in-stent restenosis.  The  circumflex had 50% proximal stenosis.  The patient then developed some  itching and became hypotensive with a systolic blood pressure  approximately 40.  He was given IV fluids as well as half an amp of  epinephrine, IV Solu-Medrol, Benadryl, and famotidine.  He was started  on dopamine and 100% O2 by non-rebreather mask.  His airway remained  stable and he did not lose consciousness.  His blood pressure improved  to greater than 123XX123 systolic, and his oxygen saturation improved.  He  was continued on steroids, Benadryl and Pepcid.  The cardiac  catheterization was aborted.   Mr. Cavazos was continued on steroids throughout his hospital stay.  He  had no further symptoms of itching.  Seventy-two hours later he was  evaluated by Dr. Burt Knack.  He was having no chest pain or shortness of  breath.  His white count was elevated at 20,000 but this was felt  secondary to the steroids.  His sugars were also running higher than  usual in the hospital, but his hemoglobin A1c was 5.9, so it was felt  that this was secondary to the steroids as well.   He was evaluated by Dr. Burt Knack who felt that an outpatient Myoview was  indicated.  If he has ischemia in the RCA territory, then an outpatient  PCI can be scheduled.  Otherwise, we will continue medical therapy for  coronary artery disease and aggressive risk factor reduction.  Of note,  the patient has a prescription Chantix, but it is not covered by his  insurance.  He is trying to obtain it at no cost from the manufacturer.  He has not yet started on this but is encouraged to quit smoking.   DISCHARGE INSTRUCTIONS:  His activity level is to be increased  gradually.  He is to call our office for problems with the cath site.  He is to follow up with Dr. Domenic Polite on April 2 at 1:00 p.m..  He is to  follow up with Dr.  Olena Heckle as well.  He is to get a stress test arranged  by our office, and they will contact him at home with the date and time.   DISCHARGE MEDICATIONS:  1. Isosorbide 60 mg daily  2. Vytorin 10/80 daily  3. Niacin 1000 mg q.h.s.  4. Aspirin 325 mg daily.  5. Lexapro 40 mg a day.  6. Lisinopril 40 mg a day.  7. Advair 250/50 b.i.d.  8. Chantix to be started as soon as possible.  9. Plavix 75 mg daily for life.  10.Xanax 1 mg as prior to admission.  11.Benadryl 25 mg up to q.i.d. p.r.n.  12.Coreg 6.25 mg b.i.d.   The patient stated that he was taking both Toprol XL 50 mg a day and  Coreg 6.25 mg b.i.d.  I contacted the pharmacy who stated that the [place]  had been filled on January 22, and when the Coreg prescription was  brought to them on February 25, they thought he was out.  The patient  was advised not to take both drugs and is to continue taking Coreg 6.25  mg b.i.d.  He is to follow up with his family physician and cardiology.      Rosaria Ferries, PA-C      Juanda Bond. Burt Knack, MD  Electronically Signed    RB/MEDQ  D:  09/22/2006  T:  09/22/2006  Job:  [id number]

## 2010-11-30 NOTE — H&P (Signed)
[name], [name]          ACCOUNT NO.:  [id number]   MEDICAL RECORD NO.:  [id number]          PATIENT TYPE:  INP   LOCATION:  2928                         FACILITY:  [facility]   PHYSICIAN:  Titus Mould, MD     DATE OF BIRTH:  [birth year]   DATE OF ADMISSION:  09/19/2006  DATE OF DISCHARGE:                              HISTORY & PHYSICAL   CHIEF COMPLAINT:  Chest pain.   HISTORY OF PRESENT ILLNESS:  [name] is a 45 year old male with a  known history of coronary artery disease who presented to an outside  hospital with complaints of chest pain.  The patient was recently  admitted to hospital in February with anterior ST elevation MI.  At that  time, he had a balloon angioplasty to a very late stent thrombosis of  the LAD.  Cardiac catheterization at that time showed an occluded LAD in  its proximal portion, 40% first diagonal, a 75% RCA, proximal and mid.  Since his discharge from the hospital, he has had no significant chest  pain.  Tonight while he was sitting on the couch, he had the onset of  left-sided chest pain and pressure, radiating to his left upper chest,  associated shortness of breath, nausea and diaphoresis.  The patient  stated that this pain was similar to the pain that he had when he  presented recently with his anterior ST elevation MI.  He was given  sublingual nitroglycerin at the outside hospital and now has had  complete resolution of his chest discomfort.  Since his discharge from  the hospital, he has had no PND or orthopnea.  No lower extremity  swelling, no syncope or presyncope.  No palpitations.  At the outside  hospital, the patient was given aspirin, nitroglycerin and heparin.   PAST MEDICAL HISTORY:  1. Coronary artery disease, status post Cypher 3.5 x 18 to the LAD in      2003, status post balloon angioplasty to a very light stent      thrombosis of the LAD in February 2008.  2. Hypertension.  3. Hyperlipidemia.  4. Diabetes mellitus,  type 2.  5. Obstructive sleep apnea.  6. Tobacco use.  7. Cholecystectomy.  8. Tonsillectomy.   MEDICATIONS:  1. Toprol-XL 50 mg p.o. daily.  2. Albuterol inhaler p.r.n.  3. Advair one puff b.i.d.  4. Plavix 75 mg p.o. daily.  5. Sublingual nitroglycerin p.r.n.  6. Niacin 1000 mg p.o. at bedtime.  7. Lexapro 40 mg p.o. daily.  8. Aspirin 81 mg p.o. daily.  9. Lisinopril 20 mg p.o. daily.  10.Imdur 16 mg p.o. daily.  11.Vytorin 10/40 half p.o. daily.  12.Neurontin 300 mg p.o. t.i.d.  13.Xanax 1 mg p.o. at bedtime.   ALLERGIES:  ALEVE.   SOCIAL HISTORY:  The patient used to smoke 1-1/2 packs a day.  Has only  smoked five cigarettes since his discharge from the hospital.   FAMILY HISTORY:  Mother with history of coronary artery disease.  Father  died at age 63 with cancer and a stroke.   PHYSICAL EXAMINATION:  VITAL SIGNS:  Blood pressure 124.73, heart  rate  82, oxygen saturation 98% on room air, respiratory rate 14.  GENERAL:  Well-developed, well-nourished male, alert and oriented x3, in  no apparent distress.  HEENT:  Atraumatic, normocephalic.  Pupils are equal, round and reactive  to light.  Extraocular movements intact.  Oropharynx clear.  NECK:  Supple.  No adenopathy.  No JVD.  No carotid bruits.  CHEST:  Lungs clear to auscultation bilaterally with equal bilateral  breath sounds.  CARDIOVASCULAR:  Regular rhythm.  Normal rate.  Normal S1 and S2.  No  murmurs, rubs or gallops.  2+ peripheral pulses.  ABDOMEN:  Soft, nontender, nondistended.  EXTREMITIES:  No clubbing, cyanosis or edema.  NEUROLOGIC:  No focal deficits.   LABORATORY DATA:  Labs from the outside hospital, sodium 137, potassium  3.2, chloride 106, bicarb 26, BUN 8, creatinine 0.9, glucose 126, CK 80,  CK-MB 2.2, troponin 0.09.  BNP 245.  INR 1.1.  PTT 23.  White blood cell  count 9.9, hematocrit 42, platelet count 263,000.  EKG from the outside  hospital shows normal sinus rhythm, anterior infarct,  anterolateral ST-T  wave changes.  The initial EKG showed anterolateral ST-T wave changes  along with inferior ST-T wave changes.   IMPRESSION:  Chest pain in a patient with no coronary artery disease.   PLAN:  1. Cardiovascular.  Admit the patient to the step-down unit, rule out      with serial cardiac enzymes, continue home cardiovascular      medications to include aspirin, statin, beta blocker, and ACE      inhibitor, and Plavix.  Continue heparin drip.  Daily EKGs.  2. Fluids, electrolytes, and nutrition.  NPO.  BNP and magnesium in      the morning.  Replete potassium.  Normal saline at 75 mL per hour.  3. Endocrine.  Sliding scale.  4. Guaiac all stools.      Titus Mould, MD  Electronically Signed     TRK/MEDQ  D:  09/19/2006  T:  09/19/2006  Job:  [id number]

## 2011-04-25 LAB — BASIC METABOLIC PANEL
BUN: 11
CO2: 27
Calcium: 9.2
Chloride: 106
Creatinine, Ser: 0.88
GFR calc Af Amer: >60
GFR calc non Af Amer: >60
Glucose, Bld: 87
Potassium: 3.9
Sodium: 139

## 2011-04-25 LAB — CBC
HCT: 45.5
Hemoglobin: 15.7
MCHC: 34.6
MCV: 88.6
Platelets: 219
RBC: 5.14
RDW: 13.7
WBC: 9.1

## 2011-04-29 LAB — BASIC METABOLIC PANEL
BUN: 9
CO2: 26
Calcium: 9.3
Chloride: 106
Creatinine, Ser: 0.79
GFR calc Af Amer: >60
GFR calc non Af Amer: >60
Glucose, Bld: 84
Potassium: 4.2
Sodium: 138

## 2011-07-18 DIAGNOSIS — T8131XA Disruption of external operation (surgical) wound, not elsewhere classified, initial encounter: Secondary | ICD-10-CM | POA: Diagnosis not present

## 2011-07-18 DIAGNOSIS — L98499 Non-pressure chronic ulcer of skin of other sites with unspecified severity: Secondary | ICD-10-CM | POA: Diagnosis not present

## 2011-07-18 DIAGNOSIS — R109 Unspecified abdominal pain: Secondary | ICD-10-CM | POA: Diagnosis not present

## 2011-07-18 DIAGNOSIS — T8130XA Disruption of wound, unspecified, initial encounter: Secondary | ICD-10-CM | POA: Diagnosis not present

## 2011-08-01 DIAGNOSIS — R109 Unspecified abdominal pain: Secondary | ICD-10-CM | POA: Diagnosis not present

## 2011-08-01 DIAGNOSIS — T8130XA Disruption of wound, unspecified, initial encounter: Secondary | ICD-10-CM | POA: Diagnosis not present

## 2011-08-01 DIAGNOSIS — T8131XA Disruption of external operation (surgical) wound, not elsewhere classified, initial encounter: Secondary | ICD-10-CM | POA: Diagnosis not present

## 2011-08-01 DIAGNOSIS — L98499 Non-pressure chronic ulcer of skin of other sites with unspecified severity: Secondary | ICD-10-CM | POA: Diagnosis not present

## 2011-08-08 DIAGNOSIS — T8131XA Disruption of external operation (surgical) wound, not elsewhere classified, initial encounter: Secondary | ICD-10-CM | POA: Diagnosis not present

## 2011-08-08 DIAGNOSIS — R109 Unspecified abdominal pain: Secondary | ICD-10-CM | POA: Diagnosis not present

## 2011-08-08 DIAGNOSIS — T8130XA Disruption of wound, unspecified, initial encounter: Secondary | ICD-10-CM | POA: Diagnosis not present

## 2011-08-08 DIAGNOSIS — L98499 Non-pressure chronic ulcer of skin of other sites with unspecified severity: Secondary | ICD-10-CM | POA: Diagnosis not present

## 2011-08-13 DIAGNOSIS — E119 Type 2 diabetes mellitus without complications: Secondary | ICD-10-CM | POA: Diagnosis not present

## 2011-08-13 DIAGNOSIS — Z951 Presence of aortocoronary bypass graft: Secondary | ICD-10-CM | POA: Diagnosis not present

## 2011-08-13 DIAGNOSIS — Z7982 Long term (current) use of aspirin: Secondary | ICD-10-CM | POA: Diagnosis not present

## 2011-08-13 DIAGNOSIS — F17200 Nicotine dependence, unspecified, uncomplicated: Secondary | ICD-10-CM | POA: Diagnosis not present

## 2011-08-13 DIAGNOSIS — I10 Essential (primary) hypertension: Secondary | ICD-10-CM | POA: Diagnosis not present

## 2011-08-13 DIAGNOSIS — K4090 Unilateral inguinal hernia, without obstruction or gangrene, not specified as recurrent: Secondary | ICD-10-CM | POA: Diagnosis not present

## 2011-08-13 DIAGNOSIS — R1013 Epigastric pain: Secondary | ICD-10-CM | POA: Diagnosis not present

## 2011-08-13 DIAGNOSIS — M7989 Other specified soft tissue disorders: Secondary | ICD-10-CM | POA: Diagnosis not present

## 2011-08-13 DIAGNOSIS — R109 Unspecified abdominal pain: Secondary | ICD-10-CM | POA: Diagnosis not present

## 2011-08-13 DIAGNOSIS — IMO0002 Reserved for concepts with insufficient information to code with codable children: Secondary | ICD-10-CM | POA: Diagnosis not present

## 2011-08-13 DIAGNOSIS — T8131XA Disruption of external operation (surgical) wound, not elsewhere classified, initial encounter: Secondary | ICD-10-CM | POA: Diagnosis not present

## 2011-08-13 DIAGNOSIS — Z79899 Other long term (current) drug therapy: Secondary | ICD-10-CM | POA: Diagnosis not present

## 2011-08-15 DIAGNOSIS — R109 Unspecified abdominal pain: Secondary | ICD-10-CM | POA: Diagnosis not present

## 2011-08-15 DIAGNOSIS — T8130XA Disruption of wound, unspecified, initial encounter: Secondary | ICD-10-CM | POA: Diagnosis not present

## 2011-08-15 DIAGNOSIS — L98499 Non-pressure chronic ulcer of skin of other sites with unspecified severity: Secondary | ICD-10-CM | POA: Diagnosis not present

## 2011-08-15 DIAGNOSIS — T8131XA Disruption of external operation (surgical) wound, not elsewhere classified, initial encounter: Secondary | ICD-10-CM | POA: Diagnosis not present

## 2011-08-20 ENCOUNTER — Other Ambulatory Visit: Payer: Self-pay

## 2011-08-20 DIAGNOSIS — I10 Essential (primary) hypertension: Secondary | ICD-10-CM | POA: Diagnosis not present

## 2011-08-20 DIAGNOSIS — E119 Type 2 diabetes mellitus without complications: Secondary | ICD-10-CM | POA: Diagnosis not present

## 2011-08-20 DIAGNOSIS — E782 Mixed hyperlipidemia: Secondary | ICD-10-CM | POA: Diagnosis not present

## 2011-08-23 DIAGNOSIS — T8189XA Other complications of procedures, not elsewhere classified, initial encounter: Secondary | ICD-10-CM | POA: Diagnosis not present

## 2011-08-23 DIAGNOSIS — T8131XA Disruption of external operation (surgical) wound, not elsewhere classified, initial encounter: Secondary | ICD-10-CM | POA: Diagnosis not present

## 2011-08-26 DIAGNOSIS — IMO0002 Reserved for concepts with insufficient information to code with codable children: Secondary | ICD-10-CM | POA: Diagnosis not present

## 2011-08-27 DIAGNOSIS — F17200 Nicotine dependence, unspecified, uncomplicated: Secondary | ICD-10-CM | POA: Diagnosis not present

## 2011-08-27 DIAGNOSIS — G4733 Obstructive sleep apnea (adult) (pediatric): Secondary | ICD-10-CM | POA: Diagnosis not present

## 2011-08-27 DIAGNOSIS — J309 Allergic rhinitis, unspecified: Secondary | ICD-10-CM | POA: Diagnosis not present

## 2011-08-27 DIAGNOSIS — I10 Essential (primary) hypertension: Secondary | ICD-10-CM | POA: Diagnosis not present

## 2011-08-27 DIAGNOSIS — E669 Obesity, unspecified: Secondary | ICD-10-CM | POA: Diagnosis not present

## 2011-08-27 DIAGNOSIS — IMO0002 Reserved for concepts with insufficient information to code with codable children: Secondary | ICD-10-CM | POA: Diagnosis not present

## 2011-08-27 DIAGNOSIS — IMO0001 Reserved for inherently not codable concepts without codable children: Secondary | ICD-10-CM | POA: Diagnosis not present

## 2011-08-27 DIAGNOSIS — E782 Mixed hyperlipidemia: Secondary | ICD-10-CM | POA: Diagnosis not present

## 2011-08-28 DIAGNOSIS — Z1889 Other specified retained foreign body fragments: Secondary | ICD-10-CM | POA: Diagnosis not present

## 2011-08-28 DIAGNOSIS — L923 Foreign body granuloma of the skin and subcutaneous tissue: Secondary | ICD-10-CM | POA: Diagnosis not present

## 2011-08-28 DIAGNOSIS — IMO0002 Reserved for concepts with insufficient information to code with codable children: Secondary | ICD-10-CM | POA: Diagnosis not present

## 2011-08-28 DIAGNOSIS — T859XXA Unspecified complication of internal prosthetic device, implant and graft, initial encounter: Secondary | ICD-10-CM | POA: Diagnosis not present

## 2011-09-13 DIAGNOSIS — H524 Presbyopia: Secondary | ICD-10-CM | POA: Diagnosis not present

## 2011-09-13 DIAGNOSIS — E119 Type 2 diabetes mellitus without complications: Secondary | ICD-10-CM | POA: Diagnosis not present

## 2011-09-13 DIAGNOSIS — H47339 Pseudopapilledema of optic disc, unspecified eye: Secondary | ICD-10-CM | POA: Diagnosis not present

## 2011-09-17 DIAGNOSIS — L719 Rosacea, unspecified: Secondary | ICD-10-CM | POA: Diagnosis not present

## 2011-09-17 DIAGNOSIS — R21 Rash and other nonspecific skin eruption: Secondary | ICD-10-CM | POA: Diagnosis not present

## 2011-09-17 DIAGNOSIS — I10 Essential (primary) hypertension: Secondary | ICD-10-CM | POA: Diagnosis not present

## 2011-09-18 DIAGNOSIS — H47339 Pseudopapilledema of optic disc, unspecified eye: Secondary | ICD-10-CM | POA: Diagnosis not present

## 2011-09-26 DIAGNOSIS — IMO0002 Reserved for concepts with insufficient information to code with codable children: Secondary | ICD-10-CM | POA: Diagnosis not present

## 2011-09-26 DIAGNOSIS — R112 Nausea with vomiting, unspecified: Secondary | ICD-10-CM | POA: Diagnosis not present

## 2011-09-26 DIAGNOSIS — Z7982 Long term (current) use of aspirin: Secondary | ICD-10-CM | POA: Diagnosis not present

## 2011-09-26 DIAGNOSIS — R197 Diarrhea, unspecified: Secondary | ICD-10-CM | POA: Diagnosis not present

## 2011-09-26 DIAGNOSIS — I4902 Ventricular flutter: Secondary | ICD-10-CM | POA: Diagnosis not present

## 2011-09-26 DIAGNOSIS — E119 Type 2 diabetes mellitus without complications: Secondary | ICD-10-CM | POA: Diagnosis not present

## 2011-09-26 DIAGNOSIS — Z87442 Personal history of urinary calculi: Secondary | ICD-10-CM | POA: Diagnosis not present

## 2011-09-26 DIAGNOSIS — F17200 Nicotine dependence, unspecified, uncomplicated: Secondary | ICD-10-CM | POA: Diagnosis not present

## 2011-09-26 DIAGNOSIS — R002 Palpitations: Secondary | ICD-10-CM | POA: Diagnosis not present

## 2011-09-26 DIAGNOSIS — Z79899 Other long term (current) drug therapy: Secondary | ICD-10-CM | POA: Diagnosis not present

## 2011-09-26 DIAGNOSIS — Z951 Presence of aortocoronary bypass graft: Secondary | ICD-10-CM | POA: Diagnosis not present

## 2011-09-26 DIAGNOSIS — I10 Essential (primary) hypertension: Secondary | ICD-10-CM | POA: Diagnosis not present

## 2011-10-07 DIAGNOSIS — I10 Essential (primary) hypertension: Secondary | ICD-10-CM | POA: Diagnosis not present

## 2011-10-07 DIAGNOSIS — R1013 Epigastric pain: Secondary | ICD-10-CM | POA: Diagnosis not present

## 2011-10-28 DIAGNOSIS — S31109A Unspecified open wound of abdominal wall, unspecified quadrant without penetration into peritoneal cavity, initial encounter: Secondary | ICD-10-CM | POA: Diagnosis not present

## 2011-10-28 DIAGNOSIS — R1906 Epigastric swelling, mass or lump: Secondary | ICD-10-CM | POA: Diagnosis not present

## 2011-10-28 DIAGNOSIS — T148XXA Other injury of unspecified body region, initial encounter: Secondary | ICD-10-CM | POA: Diagnosis not present

## 2011-10-29 DIAGNOSIS — T8189XA Other complications of procedures, not elsewhere classified, initial encounter: Secondary | ICD-10-CM | POA: Diagnosis not present

## 2011-10-29 DIAGNOSIS — T859XXA Unspecified complication of internal prosthetic device, implant and graft, initial encounter: Secondary | ICD-10-CM | POA: Diagnosis not present

## 2011-11-04 ENCOUNTER — Ambulatory Visit (INDEPENDENT_AMBULATORY_CARE_PROVIDER_SITE_OTHER): Payer: Medicare Other | Admitting: General Surgery

## 2011-11-04 ENCOUNTER — Encounter (INDEPENDENT_AMBULATORY_CARE_PROVIDER_SITE_OTHER): Payer: Self-pay | Admitting: General Surgery

## 2011-11-04 VITALS — BP 162/90 | HR 96 | Temp 97.5°F | Resp 16 | Ht 71.0 in | Wt 267.0 lb

## 2011-11-04 DIAGNOSIS — F17200 Nicotine dependence, unspecified, uncomplicated: Secondary | ICD-10-CM | POA: Diagnosis not present

## 2011-11-04 DIAGNOSIS — I209 Angina pectoris, unspecified: Secondary | ICD-10-CM | POA: Diagnosis not present

## 2011-11-04 DIAGNOSIS — M129 Arthropathy, unspecified: Secondary | ICD-10-CM | POA: Diagnosis not present

## 2011-11-04 DIAGNOSIS — I2510 Atherosclerotic heart disease of native coronary artery without angina pectoris: Secondary | ICD-10-CM | POA: Diagnosis not present

## 2011-11-04 DIAGNOSIS — F329 Major depressive disorder, single episode, unspecified: Secondary | ICD-10-CM | POA: Diagnosis not present

## 2011-11-04 DIAGNOSIS — E669 Obesity, unspecified: Secondary | ICD-10-CM | POA: Diagnosis not present

## 2011-11-04 DIAGNOSIS — Z9889 Other specified postprocedural states: Secondary | ICD-10-CM | POA: Diagnosis not present

## 2011-11-04 DIAGNOSIS — Z79899 Other long term (current) drug therapy: Secondary | ICD-10-CM | POA: Diagnosis not present

## 2011-11-04 DIAGNOSIS — T85898A Other specified complication of other internal prosthetic devices, implants and grafts, initial encounter: Secondary | ICD-10-CM | POA: Diagnosis not present

## 2011-11-04 DIAGNOSIS — Z7902 Long term (current) use of antithrombotics/antiplatelets: Secondary | ICD-10-CM | POA: Diagnosis not present

## 2011-11-04 DIAGNOSIS — Z951 Presence of aortocoronary bypass graft: Secondary | ICD-10-CM | POA: Diagnosis not present

## 2011-11-04 DIAGNOSIS — I10 Essential (primary) hypertension: Secondary | ICD-10-CM | POA: Diagnosis not present

## 2011-11-04 DIAGNOSIS — I252 Old myocardial infarction: Secondary | ICD-10-CM | POA: Diagnosis not present

## 2011-11-04 DIAGNOSIS — J449 Chronic obstructive pulmonary disease, unspecified: Secondary | ICD-10-CM | POA: Diagnosis not present

## 2011-11-04 DIAGNOSIS — F410 Panic disorder [episodic paroxysmal anxiety] without agoraphobia: Secondary | ICD-10-CM | POA: Diagnosis not present

## 2011-11-04 DIAGNOSIS — S98139A Complete traumatic amputation of one unspecified lesser toe, initial encounter: Secondary | ICD-10-CM | POA: Diagnosis not present

## 2011-11-04 DIAGNOSIS — Z959 Presence of cardiac and vascular implant and graft, unspecified: Secondary | ICD-10-CM | POA: Diagnosis not present

## 2011-11-04 DIAGNOSIS — Z6837 Body mass index (BMI) 37.0-37.9, adult: Secondary | ICD-10-CM | POA: Diagnosis not present

## 2011-11-04 DIAGNOSIS — E119 Type 2 diabetes mellitus without complications: Secondary | ICD-10-CM | POA: Diagnosis not present

## 2011-11-04 DIAGNOSIS — T8189XA Other complications of procedures, not elsewhere classified, initial encounter: Secondary | ICD-10-CM | POA: Diagnosis not present

## 2011-11-04 DIAGNOSIS — K219 Gastro-esophageal reflux disease without esophagitis: Secondary | ICD-10-CM | POA: Diagnosis not present

## 2011-11-04 NOTE — Progress Notes (Signed)
CC:  Non healing wound  HISTORY: Pt left nonhealing wound for 15 months.  He had a hernia repair by Dr. Anthony Sar in [place] around 15 months ago. He has had the incision that is healed up several times and has reopened. He has had care at the wound center. He has had multiple instances of packing to the wound. More recently, the lesion had seemed to be a sinus tract that and cauterized with silver nitrate. He continues to have purulent drainage from the wound daily. He denies nausea or vomiting. He has occasionally had fevers and chills. He complains of upper abdominal pain in the area of the mesh. He says he has had a stitch taken out at the office. 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 This did not help with healing.    Past Medical History  Diagnosis Date  . Hyperlipidemia   . Hypertension   . Heart attack   . COPD (chronic obstructive pulmonary disease)   . Depression   . Asthma   . Nerve damage     Past Surgical History  Procedure Date  . Gastric bypass 2010  . Hernia repair 2011, 2012  . Toe amputation 1998  . Tonsilectomy, adenoidectomy, bilateral myringotomy and tubes   . Dental surgery 2003  . Cholecystectomy     Current Outpatient Prescriptions  Medication Sig Dispense Refill  . ALPRAZolam (XANAX) 1 MG tablet Take 1 mg by mouth at bedtime as needed.      [name] amLODipine (NORVASC) 5 MG tablet Take 5 mg by mouth daily.      [name] aspirin 325 MG EC tablet Take 325 mg by mouth daily.      . carvedilol (COREG) 25 MG tablet Take 25 mg by mouth 2 (two) times daily with a meal.      . citalopram (CELEXA) 40 MG tablet Take 40 mg by mouth daily.      . cloNIDine (CATAPRES) 0.1 MG tablet Take 0.1 mg by mouth 2 (two) times daily.      . clopidogrel (PLAVIX) 75 MG tablet Take 75 mg by mouth daily.      . fluticasone (FLONASE) 50 MCG/ACT nasal spray Place 2 sprays into the nose daily.      . Fluticasone-Salmeterol (ADVAIR) 250-50 MCG/DOSE AEPB Inhale 1 puff into the lungs every  12 (twelve) hours.      . isosorbide mononitrate (IMDUR) 60 MG 24 hr tablet Take 60 mg by mouth daily.      [name] losartan-hydrochlorothiazide (HYZAAR) 100-25 MG per tablet Take 1 tablet by mouth daily.      . nitroGLYCERIN (NITROSTAT) 0.4 MG SL tablet Place 0.4 mg under the tongue every 5 (five) minutes as needed.      . rosuvastatin (CRESTOR) 40 MG tablet Take 40 mg by mouth daily.      . traMADol (ULTRAM) 50 MG tablet Take 50 mg by mouth every 6 (six) hours as needed.         Allergies  Allergen Reactions  . Contrast Media (Iodinated Diagnostic Agents)   . Ibuprofen     REACTION: swelling  . Nsaids      Family History  Problem Relation Age of Onset  . Breast cancer Maternal Aunt   . Lung cancer Father      History   Social History  . Marital Status: Married    Spouse Name: N/A    Number of Children: N/A  . Years of Education: N/A   Social  History Main Topics  . Smoking status: Current Everyday Smoker -- 1.5 packs/day  . Smokeless tobacco: None  . Alcohol Use: No  . Drug Use: No  . Sexually Active: None    REVIEW OF SYSTEMS - PERTINENT POSITIVES ONLY: 12 point review of systems negative other than HPI and PMH except for occasional fever, wheezing, leg swelling, abdominal pain and distention, headaches.   He is a sistand EXAM: Filed Vitals:   11/04/11 0855  BP: 162/90  Pulse: 96  Temp: 97.5 F (36.4 C)  Resp: 16    Gen:  No acute distress.  Well nourished and well groomed.   Neurological: Alert and oriented to person, place, and time. Coordination normal.  Head: Normocephalic and atraumatic.  Eyes: Conjunctivae are normal. Pupils are equal, round, and reactive to light. No scleral icterus.  Neck: Normal range of motion. Neck supple. No tracheal deviation or thyromegaly present.  Cardiovascular: Normal rate, regular rhythm, normal heart sounds and intact distal pulses.  Exam reveals no gallop and no friction rub.  No murmur heard. Respiratory: Effort normal.   No respiratory distress. No chest wall tenderness. Breath sounds normal.  No wheezes, rales or rhonchi.  GI: Soft. Bowel sounds are normal. The abdomen is soft.  There is no rebound and no guarding. There is a wound around 4 mm with pus coming out.  There is minimal depth to the sinus tract.  There is no cellulitis.  The upper abdomen is tender.   Musculoskeletal: Normal range of motion. Extremities are nontender.  Lymphadenopathy: No cervical, preauricular, postauricular or axillary adenopathy is present Skin: Skin is warm and dry. No rash noted. No diaphoresis. No erythema. No pallor. No clubbing, cyanosis, or edema.   Psychiatric: Normal mood and affect. Behavior is normal. Judgment and thought content normal.    RADIOLOGY RESULTS: Images and report are reviewed of scan at [facility] with 3 cm "soft tissue nodule" in abdominal wall.  This may be phlegmon or granulation.  No drainable fluid collection.    ASSESSMENT AND PLAN: Non-healing surgical wound Pt with 14 month delay in healing wound after hernia repair.   Pt is a smoker, and has had stitch granuloma.  Would recommend going back to prior surgeon for consideration of mesh removal versus coring out of open wound.  I would not perform surgery on him for this problem.   I advised to patient to stop smoking as that is the only factor he has control of that will help with wound healing.     Milus Height MD Surgical Oncology, General and [facility]      Visit Diagnoses: 1. Non-healing surgical wound     Primary Care Physician: Gar Ponto, MD, MD

## 2011-11-04 NOTE — Assessment & Plan Note (Signed)
Pt with 14 month delay in healing wound after hernia repair.   Pt is a smoker, and has had stitch granuloma.  Would recommend going back to prior surgeon for consideration of mesh removal versus coring out of open wound.  I would not perform surgery on him for this problem.

## 2011-11-04 NOTE — Patient Instructions (Signed)
Continue with current plan for surgery with Dr. Anthony Sar.

## 2011-11-06 DIAGNOSIS — I209 Angina pectoris, unspecified: Secondary | ICD-10-CM | POA: Diagnosis not present

## 2011-11-06 DIAGNOSIS — S31109A Unspecified open wound of abdominal wall, unspecified quadrant without penetration into peritoneal cavity, initial encounter: Secondary | ICD-10-CM | POA: Diagnosis not present

## 2011-11-06 DIAGNOSIS — M129 Arthropathy, unspecified: Secondary | ICD-10-CM | POA: Diagnosis not present

## 2011-11-06 DIAGNOSIS — E119 Type 2 diabetes mellitus without complications: Secondary | ICD-10-CM | POA: Diagnosis not present

## 2011-11-06 DIAGNOSIS — I10 Essential (primary) hypertension: Secondary | ICD-10-CM | POA: Diagnosis not present

## 2011-11-06 DIAGNOSIS — I252 Old myocardial infarction: Secondary | ICD-10-CM | POA: Diagnosis not present

## 2011-11-06 DIAGNOSIS — I2510 Atherosclerotic heart disease of native coronary artery without angina pectoris: Secondary | ICD-10-CM | POA: Diagnosis not present

## 2011-11-06 DIAGNOSIS — T85898A Other specified complication of other internal prosthetic devices, implants and grafts, initial encounter: Secondary | ICD-10-CM | POA: Diagnosis not present

## 2011-11-06 DIAGNOSIS — T8189XA Other complications of procedures, not elsewhere classified, initial encounter: Secondary | ICD-10-CM | POA: Diagnosis not present

## 2011-11-19 ENCOUNTER — Other Ambulatory Visit: Payer: Self-pay | Admitting: Family Medicine

## 2011-11-19 DIAGNOSIS — I2510 Atherosclerotic heart disease of native coronary artery without angina pectoris: Secondary | ICD-10-CM | POA: Diagnosis present

## 2011-11-19 DIAGNOSIS — Z801 Family history of malignant neoplasm of trachea, bronchus and lung: Secondary | ICD-10-CM | POA: Diagnosis not present

## 2011-11-19 DIAGNOSIS — J9819 Other pulmonary collapse: Secondary | ICD-10-CM | POA: Diagnosis not present

## 2011-11-19 DIAGNOSIS — L02219 Cutaneous abscess of trunk, unspecified: Secondary | ICD-10-CM | POA: Diagnosis present

## 2011-11-19 DIAGNOSIS — G8929 Other chronic pain: Secondary | ICD-10-CM | POA: Diagnosis present

## 2011-11-19 DIAGNOSIS — J984 Other disorders of lung: Secondary | ICD-10-CM | POA: Diagnosis not present

## 2011-11-19 DIAGNOSIS — E876 Hypokalemia: Secondary | ICD-10-CM | POA: Diagnosis present

## 2011-11-19 DIAGNOSIS — E785 Hyperlipidemia, unspecified: Secondary | ICD-10-CM | POA: Diagnosis present

## 2011-11-19 DIAGNOSIS — D709 Neutropenia, unspecified: Secondary | ICD-10-CM | POA: Diagnosis not present

## 2011-11-19 DIAGNOSIS — E871 Hypo-osmolality and hyponatremia: Secondary | ICD-10-CM | POA: Diagnosis present

## 2011-11-19 DIAGNOSIS — M1990 Unspecified osteoarthritis, unspecified site: Secondary | ICD-10-CM | POA: Diagnosis present

## 2011-11-19 DIAGNOSIS — Z7902 Long term (current) use of antithrombotics/antiplatelets: Secondary | ICD-10-CM | POA: Diagnosis not present

## 2011-11-19 DIAGNOSIS — L03319 Cellulitis of trunk, unspecified: Secondary | ICD-10-CM | POA: Diagnosis present

## 2011-11-19 DIAGNOSIS — Z7982 Long term (current) use of aspirin: Secondary | ICD-10-CM | POA: Diagnosis not present

## 2011-11-19 DIAGNOSIS — Z888 Allergy status to other drugs, medicaments and biological substances status: Secondary | ICD-10-CM | POA: Diagnosis not present

## 2011-11-19 DIAGNOSIS — M79609 Pain in unspecified limb: Secondary | ICD-10-CM | POA: Diagnosis present

## 2011-11-19 DIAGNOSIS — Z79899 Other long term (current) drug therapy: Secondary | ICD-10-CM | POA: Diagnosis not present

## 2011-11-19 DIAGNOSIS — Z8249 Family history of ischemic heart disease and other diseases of the circulatory system: Secondary | ICD-10-CM | POA: Diagnosis not present

## 2011-11-19 DIAGNOSIS — I10 Essential (primary) hypertension: Secondary | ICD-10-CM | POA: Diagnosis present

## 2011-11-19 DIAGNOSIS — R509 Fever, unspecified: Secondary | ICD-10-CM | POA: Diagnosis not present

## 2011-11-19 DIAGNOSIS — Z6837 Body mass index (BMI) 37.0-37.9, adult: Secondary | ICD-10-CM | POA: Diagnosis not present

## 2011-11-19 DIAGNOSIS — Z951 Presence of aortocoronary bypass graft: Secondary | ICD-10-CM | POA: Diagnosis not present

## 2011-11-19 DIAGNOSIS — Z886 Allergy status to analgesic agent status: Secondary | ICD-10-CM | POA: Diagnosis not present

## 2011-11-19 DIAGNOSIS — F17200 Nicotine dependence, unspecified, uncomplicated: Secondary | ICD-10-CM | POA: Diagnosis present

## 2011-11-19 DIAGNOSIS — T8140XA Infection following a procedure, unspecified, initial encounter: Secondary | ICD-10-CM | POA: Diagnosis not present

## 2011-11-19 DIAGNOSIS — A419 Sepsis, unspecified organism: Secondary | ICD-10-CM | POA: Diagnosis not present

## 2011-11-19 DIAGNOSIS — J45909 Unspecified asthma, uncomplicated: Secondary | ICD-10-CM | POA: Diagnosis present

## 2011-11-19 DIAGNOSIS — F329 Major depressive disorder, single episode, unspecified: Secondary | ICD-10-CM | POA: Diagnosis present

## 2011-11-22 ENCOUNTER — Encounter (INDEPENDENT_AMBULATORY_CARE_PROVIDER_SITE_OTHER): Payer: Self-pay

## 2011-11-29 DIAGNOSIS — IMO0002 Reserved for concepts with insufficient information to code with codable children: Secondary | ICD-10-CM | POA: Diagnosis not present

## 2011-11-29 DIAGNOSIS — F17200 Nicotine dependence, unspecified, uncomplicated: Secondary | ICD-10-CM | POA: Diagnosis not present

## 2011-11-29 DIAGNOSIS — E669 Obesity, unspecified: Secondary | ICD-10-CM | POA: Diagnosis not present

## 2011-11-29 DIAGNOSIS — I10 Essential (primary) hypertension: Secondary | ICD-10-CM | POA: Diagnosis not present

## 2011-11-29 DIAGNOSIS — J309 Allergic rhinitis, unspecified: Secondary | ICD-10-CM | POA: Diagnosis not present

## 2011-11-29 DIAGNOSIS — IMO0001 Reserved for inherently not codable concepts without codable children: Secondary | ICD-10-CM | POA: Diagnosis not present

## 2011-11-29 DIAGNOSIS — G4733 Obstructive sleep apnea (adult) (pediatric): Secondary | ICD-10-CM | POA: Diagnosis not present

## 2011-11-29 DIAGNOSIS — E782 Mixed hyperlipidemia: Secondary | ICD-10-CM | POA: Diagnosis not present

## 2012-01-13 DIAGNOSIS — Z8673 Personal history of transient ischemic attack (TIA), and cerebral infarction without residual deficits: Secondary | ICD-10-CM

## 2012-01-13 HISTORY — DX: Personal history of transient ischemic attack (TIA), and cerebral infarction without residual deficits: Z86.73

## 2012-01-16 DIAGNOSIS — G459 Transient cerebral ischemic attack, unspecified: Secondary | ICD-10-CM | POA: Diagnosis not present

## 2012-01-16 DIAGNOSIS — R209 Unspecified disturbances of skin sensation: Secondary | ICD-10-CM

## 2012-01-16 DIAGNOSIS — F17200 Nicotine dependence, unspecified, uncomplicated: Secondary | ICD-10-CM | POA: Diagnosis not present

## 2012-01-16 DIAGNOSIS — Z951 Presence of aortocoronary bypass graft: Secondary | ICD-10-CM | POA: Diagnosis not present

## 2012-01-16 DIAGNOSIS — IMO0002 Reserved for concepts with insufficient information to code with codable children: Secondary | ICD-10-CM | POA: Diagnosis not present

## 2012-01-16 DIAGNOSIS — Z79899 Other long term (current) drug therapy: Secondary | ICD-10-CM | POA: Diagnosis not present

## 2012-01-16 DIAGNOSIS — E119 Type 2 diabetes mellitus without complications: Secondary | ICD-10-CM | POA: Diagnosis not present

## 2012-01-16 DIAGNOSIS — Z7982 Long term (current) use of aspirin: Secondary | ICD-10-CM | POA: Diagnosis not present

## 2012-01-16 DIAGNOSIS — I10 Essential (primary) hypertension: Secondary | ICD-10-CM | POA: Diagnosis not present

## 2012-01-22 DIAGNOSIS — G459 Transient cerebral ischemic attack, unspecified: Secondary | ICD-10-CM | POA: Diagnosis not present

## 2012-01-24 DIAGNOSIS — R209 Unspecified disturbances of skin sensation: Secondary | ICD-10-CM | POA: Diagnosis not present

## 2012-01-24 DIAGNOSIS — R5381 Other malaise: Secondary | ICD-10-CM | POA: Diagnosis not present

## 2012-01-24 DIAGNOSIS — I6350 Cerebral infarction due to unspecified occlusion or stenosis of unspecified cerebral artery: Secondary | ICD-10-CM | POA: Diagnosis not present

## 2012-01-24 DIAGNOSIS — I10 Essential (primary) hypertension: Secondary | ICD-10-CM | POA: Diagnosis not present

## 2012-01-24 DIAGNOSIS — G459 Transient cerebral ischemic attack, unspecified: Secondary | ICD-10-CM | POA: Diagnosis not present

## 2012-01-26 DIAGNOSIS — G459 Transient cerebral ischemic attack, unspecified: Secondary | ICD-10-CM | POA: Diagnosis not present

## 2012-01-26 DIAGNOSIS — I10 Essential (primary) hypertension: Secondary | ICD-10-CM | POA: Diagnosis not present

## 2012-01-27 DIAGNOSIS — Z7902 Long term (current) use of antithrombotics/antiplatelets: Secondary | ICD-10-CM | POA: Diagnosis not present

## 2012-01-27 DIAGNOSIS — Z951 Presence of aortocoronary bypass graft: Secondary | ICD-10-CM | POA: Diagnosis not present

## 2012-01-27 DIAGNOSIS — Z79899 Other long term (current) drug therapy: Secondary | ICD-10-CM | POA: Diagnosis not present

## 2012-01-27 DIAGNOSIS — E119 Type 2 diabetes mellitus without complications: Secondary | ICD-10-CM | POA: Diagnosis not present

## 2012-01-27 DIAGNOSIS — Z7982 Long term (current) use of aspirin: Secondary | ICD-10-CM | POA: Diagnosis not present

## 2012-01-27 DIAGNOSIS — L02619 Cutaneous abscess of unspecified foot: Secondary | ICD-10-CM | POA: Diagnosis not present

## 2012-01-27 DIAGNOSIS — S91309A Unspecified open wound, unspecified foot, initial encounter: Secondary | ICD-10-CM | POA: Diagnosis not present

## 2012-01-27 DIAGNOSIS — I10 Essential (primary) hypertension: Secondary | ICD-10-CM | POA: Diagnosis not present

## 2012-01-27 DIAGNOSIS — F17200 Nicotine dependence, unspecified, uncomplicated: Secondary | ICD-10-CM | POA: Diagnosis not present

## 2012-01-27 DIAGNOSIS — L03119 Cellulitis of unspecified part of limb: Secondary | ICD-10-CM | POA: Diagnosis not present

## 2012-01-28 DIAGNOSIS — I6789 Other cerebrovascular disease: Secondary | ICD-10-CM | POA: Diagnosis not present

## 2012-02-07 ENCOUNTER — Encounter: Payer: Self-pay | Admitting: Cardiology

## 2012-02-07 ENCOUNTER — Other Ambulatory Visit: Payer: Self-pay | Admitting: *Deleted

## 2012-02-07 ENCOUNTER — Ambulatory Visit (INDEPENDENT_AMBULATORY_CARE_PROVIDER_SITE_OTHER): Payer: Medicare Other | Admitting: Cardiology

## 2012-02-07 VITALS — BP 113/71 | HR 74 | Ht 71.0 in | Wt 257.0 lb

## 2012-02-07 DIAGNOSIS — Z8673 Personal history of transient ischemic attack (TIA), and cerebral infarction without residual deficits: Secondary | ICD-10-CM | POA: Diagnosis not present

## 2012-02-07 DIAGNOSIS — I6350 Cerebral infarction due to unspecified occlusion or stenosis of unspecified cerebral artery: Secondary | ICD-10-CM

## 2012-02-07 DIAGNOSIS — I639 Cerebral infarction, unspecified: Secondary | ICD-10-CM

## 2012-02-07 DIAGNOSIS — F17200 Nicotine dependence, unspecified, uncomplicated: Secondary | ICD-10-CM

## 2012-02-07 DIAGNOSIS — I10 Essential (primary) hypertension: Secondary | ICD-10-CM

## 2012-02-07 DIAGNOSIS — I2510 Atherosclerotic heart disease of native coronary artery without angina pectoris: Secondary | ICD-10-CM | POA: Diagnosis not present

## 2012-02-07 DIAGNOSIS — E782 Mixed hyperlipidemia: Secondary | ICD-10-CM

## 2012-02-07 NOTE — Assessment & Plan Note (Addendum)
Symptomatically stable without angina. I reinforced compliance with medications, returning to a walking regimen, and smoking cessation. Recent echocardiogram shows stable LVEF of approximately 50%. Followup arranged.

## 2012-02-07 NOTE — Assessment & Plan Note (Signed)
Outlined above. Patient with poorly controlled hypertension preceding the event by report, also continues to smoke cigarettes. He did not have obstructive carotid disease by Dopplers, and echocardiogram was overall reassuring. For now have recommended smoking cessation, reinforced compliance with his regular medications (his blood pressure is normal today}, and will also check a 7 day cardiac monitor to screen for any potential atrial arrhythmias that that would necessitate discussion of anticoagulant therapy. For now no changes made to current regimen.

## 2012-02-07 NOTE — Assessment & Plan Note (Signed)
Blood pressure normal today. 

## 2012-02-07 NOTE — Assessment & Plan Note (Signed)
He continues on Crestor. Request most recent lipid panel from Dr. Quillian Quince.

## 2012-02-07 NOTE — Progress Notes (Signed)
Clinical Summary Mr. Ings is a medically complex 46 y.o.male presenting for late followup. He was last seen February 2012. Record review finds admission to [facility] back in May with reported sepsis secondary to abdominal wall cellulitis and abscess. More recently he presented with evidence of a stroke in July - right posterior frontal cortical and subcortical brain by MRI, no hemorrhage. Carotid Dopplers showed only 1-50% bilateral ICA stenoses. Echocardiogram showed LVEF 50%, no major valvular abnormalities. ECG reviewed showing sinus rhythm.  Recent lab work from July reviewed finding BUN 16, creatinine 1.0, sodium 138, potassium 3.4, hemoglobin 15.9, platelets 196.  He tells me that prior to the stroke his blood pressure was running quite high, SBP 190s and DBP over 100, admits that he was not compliant with his medications. He has no definite history of cardiac arrhythmia. Describes a vague sense of palpitation in the evenings when he is still.  He states his blood pressure has been much better controlled, his wife has been putting out his medications for him, also checking his blood pressure at home. She is a Marine scientist.  He is smoking cigarettes again, did quit after his bypass surgery. We discussed smoking cessation again today.   Allergies  Allergen Reactions  . Contrast Media (Iodinated Diagnostic Agents)   . Ibuprofen     REACTION: swelling  . Nsaids     Current Outpatient Prescriptions  Medication Sig Dispense Refill  . ALPRAZolam (XANAX) 1 MG tablet Take 1 mg by mouth 4 (four) times daily.       [name] amLODipine (NORVASC) 5 MG tablet Take 5 mg by mouth daily.      [name] aspirin 325 MG EC tablet Take 325 mg by mouth daily.      . carvedilol (COREG) 25 MG tablet Take 25 mg by mouth 2 (two) times daily with a meal.      . citalopram (CELEXA) 40 MG tablet Take 20 mg by mouth daily.       . cloNIDine (CATAPRES) 0.1 MG tablet Take 0.1 mg by mouth as needed. If BP >170/100      .  clopidogrel (PLAVIX) 75 MG tablet Take 75 mg by mouth daily.      . fluticasone (FLONASE) 50 MCG/ACT nasal spray Place 2 sprays into the nose daily as needed.       . isosorbide mononitrate (IMDUR) 60 MG 24 hr tablet Take 120 mg by mouth daily.       [name] losartan-hydrochlorothiazide (HYZAAR) 100-25 MG per tablet Take 1 tablet by mouth daily.      . nitroGLYCERIN (NITROSTAT) 0.4 MG SL tablet Place 0.4 mg under the tongue every 5 (five) minutes as needed.      . rosuvastatin (CRESTOR) 40 MG tablet Take 40 mg by mouth daily.      . traMADol (ULTRAM) 50 MG tablet Take 50 mg by mouth every 6 (six) hours as needed.        Past Medical History  Diagnosis Date  . Mixed hyperlipidemia   . Essential hypertension, benign   . COPD (chronic obstructive pulmonary disease)   . Depression   . OSA (obstructive sleep apnea)   . Coronary atherosclerosis of native coronary artery     Multivessel, LVEF 45-50%  . Anaphylaxis     IgE mediated anaphylactic contrast dye allergy ( Omnipaque and Visipaque), no reaction to Isovue 300    Past Surgical History  Procedure Date  . Gastric bypass 2010  . Hernia repair 2011, 2012  .  Toe amputation 1998  . Tonsilectomy, adenoidectomy, bilateral myringotomy and tubes   . Dental surgery 2003  . Cholecystectomy   . Coronary artery bypass graft 2010    LIMA to LAD, SVG to diagonal, SVG to OM1 and OM 2, SVG to RCA    Family History  Problem Relation Age of Onset  . Breast cancer Maternal Aunt   . Lung cancer Father     Social History Mr. Perl reports that he has been smoking Cigarettes.  He has a 300 pack-year smoking history. He has never used smokeless tobacco. Mr. Vittori reports that he does not drink alcohol.  Review of Systems No regular exercise. No dizziness or syncope. No reported bleeding problems. No chest pain. Otherwise negative except as outlined.  Physical Examination Filed Vitals:   02/07/12 0809  BP: 113/71  Pulse: 74   Overweight male  in no acute distress. HEENT: Conjunctiva and lids normal, oropharynx clear. Neck: Supple, no elevated JVP or carotid bruits, no thyromegaly. Lungs: Clear to auscultation, nonlabored breathing at rest. Cardiac: Regular rate and rhythm, no S3 or significant systolic murmur, no pericardial rub. Abdomen: Soft, nontender, bowel sounds present, no guarding or rebound. Extremities: No pitting edema, distal pulses 2+. Skin: Warm and dry. Musculoskeletal: No kyphosis. Neuropsychiatric: Alert and oriented x3, affect grossly appropriate. Moves all extremities equally.   Problem List and Plan   CORONARY ATHEROSCLEROSIS NATIVE CORONARY ARTERY Symptomatically stable without angina. I reinforced compliance with medications, returning to a walking regimen, and smoking cessation. Recent echocardiogram shows stable LVEF of approximately 50%. Followup arranged.  History of stroke Outlined above. Patient with poorly controlled hypertension preceding the event by report, also continues to smoke cigarettes. He did not have obstructive carotid disease by Dopplers, and echocardiogram was overall reassuring. For now have recommended smoking cessation, reinforced compliance with his regular medications (his blood pressure is normal today}, and will also check a 7 day cardiac monitor to screen for any potential atrial arrhythmias that that would necessitate discussion of anticoagulant therapy. For now no changes made to current regimen.  MIXED HYPERLIPIDEMIA He continues on Crestor. Request most recent lipid panel from Dr. Quillian Quince.  ESSENTIAL HYPERTENSION, BENIGN Blood pressure normal today.  TOBACCO ABUSE Smoking cessation discussed again today.    Satira Sark, M.D., F.A.C.C.

## 2012-02-07 NOTE — Patient Instructions (Addendum)
Your physician recommends that you schedule a follow-up appointment in: 6 months with Dr. Domenic Polite. You will receive a reminder letter in the mail in about 4 months reminding you to call and schedule your appointment. If you don't receive this letter, please contact our office.  Your physician recommends that you continue on your current medications as directed. Please refer to the Current Medication list given to you today.  Your physician has recommended that you wear an event monitor. Event monitors are medical devices that record the heart's electrical activity. Doctors most often [place] these monitors to diagnose arrhythmias. Arrhythmias are problems with the speed or rhythm of the heartbeat. The monitor is a small, portable device. You can wear one while you do your normal daily activities. This is usually used to diagnose what is causing palpitations/syncope (passing out). [facility] will contact you directly about getting this set up.

## 2012-02-07 NOTE — Assessment & Plan Note (Signed)
Smoking cessation discussed again today.

## 2012-02-10 DIAGNOSIS — G459 Transient cerebral ischemic attack, unspecified: Secondary | ICD-10-CM | POA: Diagnosis not present

## 2012-02-10 DIAGNOSIS — R002 Palpitations: Secondary | ICD-10-CM

## 2012-02-14 DIAGNOSIS — Z79899 Other long term (current) drug therapy: Secondary | ICD-10-CM | POA: Diagnosis not present

## 2012-02-14 DIAGNOSIS — I10 Essential (primary) hypertension: Secondary | ICD-10-CM | POA: Diagnosis not present

## 2012-02-14 DIAGNOSIS — K644 Residual hemorrhoidal skin tags: Secondary | ICD-10-CM | POA: Diagnosis not present

## 2012-02-14 DIAGNOSIS — IMO0002 Reserved for concepts with insufficient information to code with codable children: Secondary | ICD-10-CM | POA: Diagnosis not present

## 2012-02-14 DIAGNOSIS — E119 Type 2 diabetes mellitus without complications: Secondary | ICD-10-CM | POA: Diagnosis not present

## 2012-02-14 DIAGNOSIS — Z7982 Long term (current) use of aspirin: Secondary | ICD-10-CM | POA: Diagnosis not present

## 2012-02-14 DIAGNOSIS — F17200 Nicotine dependence, unspecified, uncomplicated: Secondary | ICD-10-CM | POA: Diagnosis not present

## 2012-02-14 DIAGNOSIS — Z7902 Long term (current) use of antithrombotics/antiplatelets: Secondary | ICD-10-CM | POA: Diagnosis not present

## 2012-02-14 DIAGNOSIS — Z951 Presence of aortocoronary bypass graft: Secondary | ICD-10-CM | POA: Diagnosis not present

## 2012-02-14 DIAGNOSIS — K649 Unspecified hemorrhoids: Secondary | ICD-10-CM | POA: Diagnosis not present

## 2012-02-19 DIAGNOSIS — I10 Essential (primary) hypertension: Secondary | ICD-10-CM | POA: Diagnosis not present

## 2012-02-19 DIAGNOSIS — E669 Obesity, unspecified: Secondary | ICD-10-CM | POA: Diagnosis not present

## 2012-02-19 DIAGNOSIS — E782 Mixed hyperlipidemia: Secondary | ICD-10-CM | POA: Diagnosis not present

## 2012-02-19 DIAGNOSIS — E119 Type 2 diabetes mellitus without complications: Secondary | ICD-10-CM | POA: Diagnosis not present

## 2012-02-26 DIAGNOSIS — IMO0001 Reserved for inherently not codable concepts without codable children: Secondary | ICD-10-CM | POA: Diagnosis not present

## 2012-02-26 DIAGNOSIS — F17200 Nicotine dependence, unspecified, uncomplicated: Secondary | ICD-10-CM | POA: Diagnosis not present

## 2012-02-26 DIAGNOSIS — J309 Allergic rhinitis, unspecified: Secondary | ICD-10-CM | POA: Diagnosis not present

## 2012-02-26 DIAGNOSIS — IMO0002 Reserved for concepts with insufficient information to code with codable children: Secondary | ICD-10-CM | POA: Diagnosis not present

## 2012-02-26 DIAGNOSIS — E669 Obesity, unspecified: Secondary | ICD-10-CM | POA: Diagnosis not present

## 2012-02-26 DIAGNOSIS — I10 Essential (primary) hypertension: Secondary | ICD-10-CM | POA: Diagnosis not present

## 2012-02-26 DIAGNOSIS — E782 Mixed hyperlipidemia: Secondary | ICD-10-CM | POA: Diagnosis not present

## 2012-02-26 DIAGNOSIS — G4733 Obstructive sleep apnea (adult) (pediatric): Secondary | ICD-10-CM | POA: Diagnosis not present

## 2012-05-06 ENCOUNTER — Emergency Department (HOSPITAL_COMMUNITY)
Admission: EM | Admit: 2012-05-06 | Discharge: 2012-05-06 | Disposition: A | Discharge status: Home / Self Care | Payer: Medicare Other | Attending: Emergency Medicine | Admitting: Emergency Medicine

## 2012-05-06 ENCOUNTER — Encounter (HOSPITAL_COMMUNITY): Payer: Self-pay | Admitting: Physical Medicine and Rehabilitation

## 2012-05-06 DIAGNOSIS — K458 Other specified abdominal hernia without obstruction or gangrene: Secondary | ICD-10-CM | POA: Diagnosis not present

## 2012-05-06 DIAGNOSIS — E785 Hyperlipidemia, unspecified: Secondary | ICD-10-CM | POA: Insufficient documentation

## 2012-05-06 DIAGNOSIS — Z8669 Personal history of other diseases of the nervous system and sense organs: Secondary | ICD-10-CM | POA: Insufficient documentation

## 2012-05-06 DIAGNOSIS — F17200 Nicotine dependence, unspecified, uncomplicated: Secondary | ICD-10-CM | POA: Diagnosis not present

## 2012-05-06 DIAGNOSIS — Z8659 Personal history of other mental and behavioral disorders: Secondary | ICD-10-CM | POA: Diagnosis not present

## 2012-05-06 DIAGNOSIS — IMO0002 Reserved for concepts with insufficient information to code with codable children: Secondary | ICD-10-CM | POA: Insufficient documentation

## 2012-05-06 DIAGNOSIS — I2510 Atherosclerotic heart disease of native coronary artery without angina pectoris: Secondary | ICD-10-CM | POA: Insufficient documentation

## 2012-05-06 DIAGNOSIS — J449 Chronic obstructive pulmonary disease, unspecified: Secondary | ICD-10-CM | POA: Diagnosis not present

## 2012-05-06 DIAGNOSIS — K469 Unspecified abdominal hernia without obstruction or gangrene: Secondary | ICD-10-CM | POA: Insufficient documentation

## 2012-05-06 DIAGNOSIS — I10 Essential (primary) hypertension: Secondary | ICD-10-CM | POA: Diagnosis not present

## 2012-05-06 DIAGNOSIS — J4489 Other specified chronic obstructive pulmonary disease: Secondary | ICD-10-CM | POA: Insufficient documentation

## 2012-05-06 LAB — URINALYSIS, ROUTINE W REFLEX MICROSCOPIC
Bilirubin Urine: NEGATIVE
Glucose, UA: NEGATIVE mg/dL
Hgb urine dipstick: NEGATIVE
Ketones, ur: NEGATIVE mg/dL
Leukocytes, UA: NEGATIVE
Nitrite: NEGATIVE
Protein, ur: NEGATIVE mg/dL
Specific Gravity, Urine: 1.025 (ref 1.005–1.030)
Urobilinogen, UA: 0.2 mg/dL (ref 0.0–1.0)
pH: 5 (ref 5.0–8.0)

## 2012-05-06 MED ORDER — OXYCODONE-ACETAMINOPHEN 5-325 MG PO TABS
1.0000 | ORAL_TABLET | Freq: Once | ORAL | Status: AC
  Administered 2012-05-06: 1 via ORAL
  Filled 2012-05-06: qty 1

## 2012-05-06 MED ORDER — OXYCODONE-ACETAMINOPHEN 5-325 MG PO TABS: 1.0000 | ORAL_TABLET | Freq: Four times a day (QID) | ORAL | Status: DC | PRN

## 2012-05-06 NOTE — ED Provider Notes (Signed)
History     CSN: [id number]  Arrival date & time 05/06/12  1555   First MD Initiated Contact with Patient 05/06/12 1727      Chief Complaint  Patient presents with  . Hernia    (Consider location/radiation/quality/duration/timing/severity/associated sxs/prior treatment) HPI Comments: Patient presents today with an abdominal hernia.  He reports that the hernia has been present for years.  In 2012 he had the hernia surgically repaired using mesh.  He states that in March of 2013 the mesh was removed along with a foreign body.   His hernia returned two months ago.  The hernia has become increasingly painful over the past 2 weeks.  He is able to reduce the hernia by applying pressure.  He has not noticed any changes in the skin color.  His surgery was performed by a Dr. Brunilda Payor in [place].  Patient reports that he does not want to go back to this surgeon and is requesting to be given follow up with a surgeon in [place].  He denies nausea or vomiting.  He has been having regular bowel movements.  Last BM was earlier this morning.  He denies fever or chills.  He has taken Tylenol for the pain, but does not feel that it helps.  The history is provided by the patient.    Past Medical History  Diagnosis Date  . Mixed hyperlipidemia   . Essential hypertension, benign   . COPD (chronic obstructive pulmonary disease)   . Depression   . OSA (obstructive sleep apnea)   . Coronary atherosclerosis of native coronary artery     Multivessel, LVEF 45-50%  . Anaphylaxis     IgE mediated anaphylactic contrast dye allergy ( Omnipaque and Visipaque), no reaction to Isovue 300    Past Surgical History  Procedure Date  . Gastric bypass 2010  . Hernia repair 2011, 2012  . Toe amputation 1998  . Tonsilectomy, adenoidectomy, bilateral myringotomy and tubes   . Dental surgery 2003  . Cholecystectomy   . Coronary artery bypass graft 2010    LIMA to LAD, SVG to diagonal, SVG to OM1 and OM 2, SVG to RCA      Family History  Problem Relation Age of Onset  . Breast cancer Maternal Aunt   . Lung cancer Father     History  Substance Use Topics  . Smoking status: Current Every Day Smoker -- 10.0 packs/day for 30 years    Types: Cigarettes  . Smokeless tobacco: Never Used  . Alcohol Use: No      Review of Systems  Constitutional: Negative for fever and chills.  Gastrointestinal: Positive for abdominal pain. Negative for nausea, vomiting, diarrhea, constipation and abdominal distention.       Abdominal hernia  Genitourinary: Negative for dysuria, frequency, decreased urine volume and difficulty urinating.  All other systems reviewed and are negative.    Allergies  Contrast media; Ibuprofen; and Nsaids  Home Medications   Current Outpatient Rx  Name Route Sig Dispense Refill  . ALPRAZOLAM 1 MG PO TABS Oral Take 1 mg by mouth 4 (four) times daily.     [name] AMLODIPINE BESYLATE 5 MG PO TABS Oral Take 5 mg by mouth daily.    . ASPIRIN 325 MG PO TBEC Oral Take 325 mg by mouth daily.    [name] CARVEDILOL 25 MG PO TABS Oral Take 25 mg by mouth 2 (two) times daily with a meal.    . CITALOPRAM HYDROBROMIDE 40 MG PO TABS Oral Take 20  mg by mouth daily.     [name] CLOPIDOGREL BISULFATE 75 MG PO TABS Oral Take 75 mg by mouth daily.    [name] FLUTICASONE PROPIONATE 50 MCG/ACT NA SUSP Nasal Place 2 sprays into the nose daily as needed. For dry nasal passages    . FLUTICASONE-SALMETEROL 250-50 MCG/DOSE IN AEPB Inhalation Inhale 1 puff into the lungs every 12 (twelve) hours as needed. For shortness of breath    . ISOSORBIDE MONONITRATE ER 60 MG PO TB24 Oral Take 60 mg by mouth 2 (two) times daily.     [name] LOSARTAN POTASSIUM-HCTZ 100-25 MG PO TABS Oral Take 1 tablet by mouth daily.    [name] NITROGLYCERIN 0.4 MG SL SUBL Sublingual Place 0.4 mg under the tongue every 5 (five) minutes as needed. For chest pain    . TRAMADOL HCL 50 MG PO TABS Oral Take 50 mg by mouth every 6 (six) hours as needed. For pain      BP 136/80   Pulse 76  Temp 98.2 F (36.8 C) (Oral)  Resp 18  SpO2 99%  Physical Exam  Nursing note and vitals reviewed. Constitutional: He appears well-developed and well-nourished. No distress.  HENT:  Head: Normocephalic and atraumatic.  Neck: Normal range of motion. Neck supple.  Cardiovascular: Normal rate, regular rhythm and normal heart sounds.   Pulmonary/Chest: Effort normal and breath sounds normal.  Abdominal: Soft. Bowel sounds are normal. He exhibits no distension. There is no rigidity, no rebound and no guarding.    Neurological: He is alert.  Skin: Skin is warm and dry. He is not diaphoretic. No erythema.  Psychiatric: He has a normal mood and affect.    ED Course  Procedures (including critical care time)   Labs Reviewed  URINALYSIS, ROUTINE W REFLEX MICROSCOPIC   No results found.   No diagnosis found.  6:58 PM Reassessed patient.  He reports that his pain has improved after he was given one Percocet.  MDM  Patient presenting with abdominal hernia.  Hernia is reducible.  No vomiting.  Therefore, feel that incarcerated hernia is very unlikely.  Patient discharged home with pain medications and Surgery follow up.  Strict return precautions discussed with patient.  Patient verbalizes understanding and is in agreement with the plan.         Sherlyn Lees [place], PA-C 05/07/12 [phone]

## 2012-05-06 NOTE — ED Notes (Signed)
Pt presents to department for evaluation of abdominal hernia. Protruding mass noted to middle of upper abdomen, states multiple hernia repairs. Now states pain and increase in size x2 weeks. Denies urinary symptoms. 10/10 pain at the time. He is alert and oriented x4.

## 2012-05-07 ENCOUNTER — Telehealth (INDEPENDENT_AMBULATORY_CARE_PROVIDER_SITE_OTHER): Payer: Self-pay

## 2012-05-07 NOTE — ED Provider Notes (Signed)
Medical screening examination/treatment/procedure(s) were performed by non-physician practitioner and as supervising physician I was immediately available for consultation/collaboration.  Leota Jacobsen, MD 05/07/12 2225

## 2012-05-07 NOTE — Telephone Encounter (Signed)
I would not operate on him.  I do not know if any of the others are willing to see this problem.  I would ask [facility] or [facility].  If they are not interested, would recommend going to a hernia center such as [place].

## 2012-05-07 NOTE — Telephone Encounter (Signed)
Pt called stating he has an appt for 11-8 with Dr Barry Dienes to eval area he had hernia repair and then went back to his outside surgeon to have all of the mesh removed. Pt states since the mesh was removed he has been having continued pain. Pt wants Dr Barry Dienes to review his case and be sure she is willing to care for him or does he need referral to another outside facility. Pt states he does not want to wait until 05-22-12 to be advised Dr Barry Dienes cannot help him. Pt states he will not return to the surgeon that did his hernia repair and removed the mesh. Pt advised I will send request for Dr Barry Dienes to review his case and call him with recommendations. Pt can be reached at [phone].

## 2012-05-13 ENCOUNTER — Emergency Department (HOSPITAL_COMMUNITY): Payer: Medicare Other

## 2012-05-13 ENCOUNTER — Observation Stay (HOSPITAL_COMMUNITY)
Admission: EM | Admit: 2012-05-13 | Discharge: 2012-05-14 | Disposition: A | Discharge status: Home / Self Care | Payer: Medicare Other | Attending: Internal Medicine | Admitting: Internal Medicine

## 2012-05-13 ENCOUNTER — Encounter (HOSPITAL_COMMUNITY): Payer: Self-pay | Admitting: Emergency Medicine

## 2012-05-13 DIAGNOSIS — E782 Mixed hyperlipidemia: Secondary | ICD-10-CM | POA: Insufficient documentation

## 2012-05-13 DIAGNOSIS — K56609 Unspecified intestinal obstruction, unspecified as to partial versus complete obstruction: Secondary | ICD-10-CM | POA: Diagnosis not present

## 2012-05-13 DIAGNOSIS — G4733 Obstructive sleep apnea (adult) (pediatric): Secondary | ICD-10-CM | POA: Diagnosis not present

## 2012-05-13 DIAGNOSIS — Z8673 Personal history of transient ischemic attack (TIA), and cerebral infarction without residual deficits: Secondary | ICD-10-CM | POA: Insufficient documentation

## 2012-05-13 DIAGNOSIS — J449 Chronic obstructive pulmonary disease, unspecified: Secondary | ICD-10-CM | POA: Insufficient documentation

## 2012-05-13 DIAGNOSIS — I2510 Atherosclerotic heart disease of native coronary artery without angina pectoris: Secondary | ICD-10-CM | POA: Diagnosis not present

## 2012-05-13 DIAGNOSIS — R109 Unspecified abdominal pain: Secondary | ICD-10-CM | POA: Insufficient documentation

## 2012-05-13 DIAGNOSIS — I10 Essential (primary) hypertension: Secondary | ICD-10-CM | POA: Diagnosis not present

## 2012-05-13 DIAGNOSIS — K439 Ventral hernia without obstruction or gangrene: Secondary | ICD-10-CM | POA: Diagnosis not present

## 2012-05-13 DIAGNOSIS — K469 Unspecified abdominal hernia without obstruction or gangrene: Secondary | ICD-10-CM

## 2012-05-13 DIAGNOSIS — F17200 Nicotine dependence, unspecified, uncomplicated: Secondary | ICD-10-CM | POA: Insufficient documentation

## 2012-05-13 DIAGNOSIS — K432 Incisional hernia without obstruction or gangrene: Secondary | ICD-10-CM | POA: Insufficient documentation

## 2012-05-13 DIAGNOSIS — K7689 Other specified diseases of liver: Secondary | ICD-10-CM | POA: Diagnosis not present

## 2012-05-13 DIAGNOSIS — J4489 Other specified chronic obstructive pulmonary disease: Secondary | ICD-10-CM | POA: Insufficient documentation

## 2012-05-13 DIAGNOSIS — R112 Nausea with vomiting, unspecified: Secondary | ICD-10-CM | POA: Diagnosis not present

## 2012-05-13 HISTORY — DX: Cerebral infarction, unspecified: I63.9

## 2012-05-13 LAB — CBC WITH DIFFERENTIAL/PLATELET
Basophils Absolute: 0.1 10*3/uL (ref 0.0–0.1)
Basophils Relative: 1 % (ref 0–1)
Eosinophils Absolute: 0.4 10*3/uL (ref 0.0–0.7)
Eosinophils Relative: 3 % (ref 0–5)
HCT: 45.7 % (ref 39.0–52.0)
Hemoglobin: 16.1 g/dL (ref 13.0–17.0)
Lymphocytes Relative: 32 % (ref 12–46)
Lymphs Abs: 3.8 10*3/uL (ref 0.7–4.0)
MCH: 31.1 pg (ref 26.0–34.0)
MCHC: 35.2 g/dL (ref 30.0–36.0)
MCV: 88.4 fL (ref 78.0–100.0)
Monocytes Absolute: 0.8 10*3/uL (ref 0.1–1.0)
Monocytes Relative: 7 % (ref 3–12)
Neutro Abs: 6.7 10*3/uL (ref 1.7–7.7)
Neutrophils Relative %: 57 % (ref 43–77)
Platelets: 207 10*3/uL (ref 150–400)
RBC: 5.17 MIL/uL (ref 4.22–5.81)
RDW: 12.8 % (ref 11.5–15.5)
WBC: 11.8 10*3/uL — ABNORMAL HIGH (ref 4.0–10.5)

## 2012-05-13 LAB — COMPREHENSIVE METABOLIC PANEL
ALT: 29 U/L (ref 0–53)
AST: 26 U/L (ref 0–37)
Albumin: 3.8 g/dL (ref 3.5–5.2)
Alkaline Phosphatase: 55 U/L (ref 39–117)
BUN: 16 mg/dL (ref 6–23)
CO2: 24 mEq/L (ref 19–32)
Calcium: 9.5 mg/dL (ref 8.4–10.5)
Chloride: 105 mEq/L (ref 96–112)
Creatinine, Ser: 1 mg/dL (ref 0.50–1.35)
GFR calc Af Amer: >90 mL/min (ref >90)
GFR calc non Af Amer: >90 mL/min (ref >90)
Glucose, Bld: 90 mg/dL (ref 70–99)
Potassium: 3.9 mEq/L (ref 3.5–5.1)
Sodium: 140 mEq/L (ref 135–145)
Total Bilirubin: 0.3 mg/dL (ref 0.3–1.2)
Total Protein: 7.7 g/dL (ref 6.0–8.3)

## 2012-05-13 LAB — LIPASE, BLOOD: Lipase: 48 U/L (ref 11–59)

## 2012-05-13 MED ORDER — DIPHENHYDRAMINE HCL 50 MG/ML IJ SOLN
25.0000 mg | Freq: Once | INTRAMUSCULAR | Status: AC
  Administered 2012-05-13: 25 mg via INTRAVENOUS
  Filled 2012-05-13: qty 1

## 2012-05-13 MED ORDER — MORPHINE SULFATE 4 MG/ML IJ SOLN
4.0000 mg | Freq: Once | INTRAMUSCULAR | Status: AC
  Administered 2012-05-13: 4 mg via INTRAVENOUS
  Filled 2012-05-13: qty 1

## 2012-05-13 MED ORDER — ONDANSETRON HCL 4 MG/2ML IJ SOLN
4.0000 mg | Freq: Once | INTRAMUSCULAR | Status: AC
  Administered 2012-05-13: 4 mg via INTRAVENOUS
  Filled 2012-05-13: qty 2

## 2012-05-13 MED ORDER — HYDROMORPHONE HCL PF 1 MG/ML IJ SOLN
1.0000 mg | Freq: Once | INTRAMUSCULAR | Status: AC
  Administered 2012-05-13: 1 mg via INTRAVENOUS
  Filled 2012-05-13: qty 1

## 2012-05-13 NOTE — ED Notes (Signed)
Pt reports allergy to contrast dye, benadryl ordered.

## 2012-05-13 NOTE — ED Provider Notes (Signed)
History     CSN: [id number]  Arrival date & time 05/13/12  1545   First MD Initiated Contact with Patient 05/13/12 1837      Chief Complaint  Patient presents with  . Hernia    (Consider location/radiation/quality/duration/timing/severity/associated sxs/prior treatment) HPI Comments: This is a 46 year old male, who presents to the emergency department with chief complaint of abdominal pain. Patient states that he has had a known abdominal hernia, but that now his pain is worsening, he has associated nausea, vomiting, and diarrhea. Patient has not tried anything for pain.  Patient denies chest pain, shortness of breath. Patient is a 9/10 pain.  Patient has had several abdominal hernia repairs. Also CABG.  The history is provided by the patient. No language interpreter was used.    Past Medical History  Diagnosis Date  . Mixed hyperlipidemia   . Essential hypertension, benign   . COPD (chronic obstructive pulmonary disease)   . Depression   . OSA (obstructive sleep apnea)   . Coronary atherosclerosis of native coronary artery     Multivessel, LVEF 45-50%  . 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 Stroke july 2013    Past Surgical History  Procedure Date  . Gastric bypass 2010  . Hernia repair 2011, 2012  . Toe amputation 1998  . Tonsilectomy, adenoidectomy, bilateral myringotomy and tubes   . Dental surgery 2003  . Cholecystectomy   . Coronary artery bypass graft 2010    LIMA to LAD, SVG to diagonal, SVG to OM1 and OM 2, SVG to RCA    Family History  Problem Relation Age of Onset  . Breast cancer Maternal Aunt   . Lung cancer Father     History  Substance Use Topics  . Smoking status: Current Every Day Smoker -- 1.0 packs/day for 30 years    Types: Cigarettes  . Smokeless tobacco: Never Used  . Alcohol Use: No      Review of Systems  Constitutional: Negative for fever.    Gastrointestinal: Positive for abdominal pain.  All other systems reviewed and are negative.    Allergies  Contrast media; Ibuprofen; and Nsaids  Home Medications   Current Outpatient Rx  Name Route Sig Dispense Refill  . ALPRAZOLAM 1 MG PO TABS Oral Take 1 mg by mouth 4 (four) times daily.     [name] AMLODIPINE BESYLATE 5 MG PO TABS Oral Take 5 mg by mouth daily.    . ASPIRIN 325 MG PO TBEC Oral Take 325 mg by mouth daily.    [name] CARVEDILOL 25 MG PO TABS Oral Take 25 mg by mouth 2 (two) times daily with a meal.    . CITALOPRAM HYDROBROMIDE 40 MG PO TABS Oral Take 20 mg by mouth daily.     [name] CLOPIDOGREL BISULFATE 75 MG PO TABS Oral Take 75 mg by mouth daily.    [name] FLUTICASONE PROPIONATE 50 MCG/ACT NA SUSP Nasal Place 2 sprays into the nose daily as needed. For dry nasal passages    . FLUTICASONE-SALMETEROL 250-50 MCG/DOSE IN AEPB Inhalation Inhale 1 puff into the lungs every 12 (twelve) hours as needed. For shortness of breath    . ISOSORBIDE MONONITRATE ER 60 MG PO TB24 Oral Take 60 mg by mouth 2 (two) times daily.     [name] LOSARTAN POTASSIUM-HCTZ 100-25 MG PO TABS Oral Take 1 tablet by mouth daily.    [name] NITROGLYCERIN 0.4 MG  SL SUBL Sublingual Place 0.4 mg under the tongue every 5 (five) minutes as needed. For chest pain    . OXYCODONE-ACETAMINOPHEN 5-325 MG PO TABS Oral Take 1 tablet by mouth every 6 (six) hours as needed for pain. 15 tablet 0  . TRAMADOL HCL 50 MG PO TABS Oral Take 50 mg by mouth every 6 (six) hours as needed. For pain      BP 139/81  Pulse 76  Temp 97.8 F (36.6 C) (Oral)  Resp 20  SpO2 97%  Physical Exam  Nursing note and vitals reviewed. Constitutional: He is oriented to person, place, and time. He appears well-developed and well-nourished.  HENT:  Head: Normocephalic and atraumatic.  Eyes: Conjunctivae normal and EOM are normal. Pupils are equal, round, and reactive to light.  Neck: Normal range of motion. Neck supple.  Cardiovascular: Normal rate, regular  rhythm and normal heart sounds.   Pulmonary/Chest: Effort normal and breath sounds normal. No respiratory distress. He has no wheezes. He has no rales. He exhibits no tenderness.  Abdominal: Soft. Bowel sounds are normal. He exhibits distension and mass. There is tenderness. There is no rebound and no guarding.    Musculoskeletal: Normal range of motion.  Neurological: He is alert and oriented to person, place, and time.  Skin: Skin is warm and dry.  Psychiatric: He has a normal mood and affect. His behavior is normal. Judgment and thought content normal.    ED Course  Procedures (including critical care time)  Labs Reviewed  CBC WITH DIFFERENTIAL - Abnormal; Notable for the following:    WBC 11.8 (*)     All other components within normal limits  COMPREHENSIVE METABOLIC PANEL  LIPASE, BLOOD   Ct Abdomen Pelvis Wo Contrast  05/13/2012  *RADIOLOGY REPORT*  Clinical Data: Fever and body aches.  Question hernia.  CT ABDOMEN AND PELVIS WITHOUT CONTRAST  Technique:  Multidetector CT imaging of the abdomen and pelvis was performed following the standard protocol without intravenous contrast.  Comparison: CT abdomen and pelvis 05/02/2004.  Findings: There is mild cardiomegaly.  No pleural or pericardial effusion.  Lung bases are clear.  The patient has a new midline hernia in the upper abdomen which contains a loop of transverse colon. Colon within the hernia is normal in appearance.  Hernia defect measures 6 cm transverse by 7.3 cm cranial-caudal.  No other hernia is identified.  The patient is status post cholecystectomy.  The liver is diffusely low attenuating consistent with fatty infiltration.  No focal liver lesion is identified.  The spleen, adrenal glands, pancreas and kidneys appear normal.  Urinary bladder, seminal vesicles and prostate gland appear normal.  The stomach is unremarkable.  There are some mildly dilated loops of small bowel in the right lower quadrant measuring up to 3.6 cm in  diameter.  Small bowel feces sign is identified in the right lower quadrant with transition to completely decompressed small bowel loops. No pneumatosis, portal venous gas or free intraperitoneal air is present.  The appendix is not visualized but no evidence of appendicitis is seen.  IMPRESSION:    1.  Findings suspicious for early small bowel obstruction due to adhesions with the transition point in the right lower quadrant.  2.  Midline ventral hernia in the upper abdomen contains a loop of transverse colon.  There is no evidence of incarceration or other complicating feature.  3.  Fatty infiltration of the liver.   Original Report Authenticated By: Arvid Right. Luther Parody, M.D.  Results for orders placed during the hospital encounter of 05/13/12  COMPREHENSIVE METABOLIC PANEL      Component Value Range   Sodium 140  135 - 145 mEq/L   Potassium 3.9  3.5 - 5.1 mEq/L   Chloride 105  96 - 112 mEq/L   CO2 24  19 - 32 mEq/L   Glucose, Bld 90  70 - 99 mg/dL   BUN 16  6 - 23 mg/dL   Creatinine, Ser 1.00  0.50 - 1.35 mg/dL   Calcium 9.5  8.4 - 10.5 mg/dL   Total Protein 7.7  6.0 - 8.3 g/dL   Albumin 3.8  3.5 - 5.2 g/dL   AST 26  0 - 37 U/L   ALT 29  0 - 53 U/L   Alkaline Phosphatase 55  39 - 117 U/L   Total Bilirubin 0.3  0.3 - 1.2 mg/dL   GFR calc non Af Amer >90  >90 mL/min   GFR calc Af Amer >90  >90 mL/min  LIPASE, BLOOD      Component Value Range   Lipase 48  11 - 59 U/L  CBC WITH DIFFERENTIAL      Component Value Range   WBC 11.8 (*) 4.0 - 10.5 K/uL   RBC 5.17  4.22 - 5.81 MIL/uL   Hemoglobin 16.1  13.0 - 17.0 g/dL   HCT 45.7  39.0 - 52.0 %   MCV 88.4  78.0 - 100.0 fL   MCH 31.1  26.0 - 34.0 pg   MCHC 35.2  30.0 - 36.0 g/dL   RDW 12.8  11.5 - 15.5 %   Platelets 207  150 - 400 K/uL   Neutrophils Relative 57  43 - 77 %   Neutro Abs 6.7  1.7 - 7.7 K/uL   Lymphocytes Relative 32  12 - 46 %   Lymphs Abs 3.8  0.7 - 4.0 K/uL   Monocytes Relative 7  3 - 12 %   Monocytes Absolute 0.8   0.1 - 1.0 K/uL   Eosinophils Relative 3  0 - 5 %   Eosinophils Absolute 0.4  0.0 - 0.7 K/uL   Basophils Relative 1  0 - 1 %   Basophils Absolute 0.1  0.0 - 0.1 K/uL   Ct Abdomen Pelvis Wo Contrast  05/13/2012  *RADIOLOGY REPORT*  Clinical Data: Fever and body aches.  Question hernia.  CT ABDOMEN AND PELVIS WITHOUT CONTRAST  Technique:  Multidetector CT imaging of the abdomen and pelvis was performed following the standard protocol without intravenous contrast.  Comparison: CT abdomen and pelvis 05/02/2004.  Findings: There is mild cardiomegaly.  No pleural or pericardial effusion.  Lung bases are clear.  The patient has a new midline hernia in the upper abdomen which contains a loop of transverse colon. Colon within the hernia is normal in appearance.  Hernia defect measures 6 cm transverse by 7.3 cm cranial-caudal.  No other hernia is identified.  The patient is status post cholecystectomy.  The liver is diffusely low attenuating consistent with fatty infiltration.  No focal liver lesion is identified.  The spleen, adrenal glands, pancreas and kidneys appear normal.  Urinary bladder, seminal vesicles and prostate gland appear normal.  The stomach is unremarkable.  There are some mildly dilated loops of small bowel in the right lower quadrant measuring up to 3.6 cm in diameter.  Small bowel feces sign is identified in the right lower quadrant with transition to completely decompressed small bowel loops. No pneumatosis, portal  venous gas or free intraperitoneal air is present.  The appendix is not visualized but no evidence of appendicitis is seen.  IMPRESSION:    1.  Findings suspicious for early small bowel obstruction due to adhesions with the transition point in the right lower quadrant.  2.  Midline ventral hernia in the upper abdomen contains a loop of transverse colon.  There is no evidence of incarceration or other complicating feature.  3.  Fatty infiltration of the liver.   Original Report  Authenticated By: Arvid Right. D'ALESSIO, M.D.       1. SBO (small bowel obstruction)   2. Hernia       MDM  46 year old male with abdominal pain. This patient reports that the pain has changed, I'm going to get a CT abdomen and pelvis. Patient's pain has been controlled with Dilaudid.  11:21 PM The patient is being admitted by Dr. Hal Hope, to [facility].  Dr. Hulen Skains from surgery states that this is not a surgical emergency, and has placed him on the list to be seen in the morning.        Montine Circle, PA-C 05/13/12 2326

## 2012-05-13 NOTE — ED Notes (Addendum)
Pt reports having abd hernia, was here one week ago for same, reports EDP looked at hernia, and was told that he would need surgery; pt back today because having vomiting, and increased pain; reports made appt on 8th to speak with [facility]; has had hernia since 2011

## 2012-05-13 NOTE — ED Notes (Signed)
Patient returned from CT

## 2012-05-13 NOTE — ED Notes (Signed)
Pt is finished drinking CT contrast, CT notified.

## 2012-05-14 ENCOUNTER — Encounter (HOSPITAL_COMMUNITY): Payer: Self-pay | Admitting: General Surgery

## 2012-05-14 DIAGNOSIS — R109 Unspecified abdominal pain: Secondary | ICD-10-CM

## 2012-05-14 DIAGNOSIS — I10 Essential (primary) hypertension: Secondary | ICD-10-CM | POA: Diagnosis not present

## 2012-05-14 DIAGNOSIS — I2510 Atherosclerotic heart disease of native coronary artery without angina pectoris: Secondary | ICD-10-CM

## 2012-05-14 DIAGNOSIS — F17200 Nicotine dependence, unspecified, uncomplicated: Secondary | ICD-10-CM

## 2012-05-14 DIAGNOSIS — K56609 Unspecified intestinal obstruction, unspecified as to partial versus complete obstruction: Secondary | ICD-10-CM | POA: Diagnosis not present

## 2012-05-14 LAB — COMPREHENSIVE METABOLIC PANEL
ALT: 63 U/L — ABNORMAL HIGH (ref 0–53)
AST: 72 U/L — ABNORMAL HIGH (ref 0–37)
Albumin: 3.2 g/dL — ABNORMAL LOW (ref 3.5–5.2)
Alkaline Phosphatase: 48 U/L (ref 39–117)
BUN: 15 mg/dL (ref 6–23)
CO2: 24 mEq/L (ref 19–32)
Calcium: 8.6 mg/dL (ref 8.4–10.5)
Chloride: 104 mEq/L (ref 96–112)
Creatinine, Ser: 1.04 mg/dL (ref 0.50–1.35)
GFR calc Af Amer: >90 mL/min (ref >90)
GFR calc non Af Amer: 86 mL/min — ABNORMAL LOW (ref >90)
Glucose, Bld: 105 mg/dL — ABNORMAL HIGH (ref 70–99)
Potassium: 3.7 mEq/L (ref 3.5–5.1)
Sodium: 138 mEq/L (ref 135–145)
Total Bilirubin: 0.8 mg/dL (ref 0.3–1.2)
Total Protein: 6.6 g/dL (ref 6.0–8.3)

## 2012-05-14 LAB — CBC WITH DIFFERENTIAL/PLATELET
Basophils Absolute: 0.1 10*3/uL (ref 0.0–0.1)
Basophils Relative: 1 % (ref 0–1)
Eosinophils Absolute: 0.3 10*3/uL (ref 0.0–0.7)
Eosinophils Relative: 3 % (ref 0–5)
HCT: 43 % (ref 39.0–52.0)
Hemoglobin: 14.8 g/dL (ref 13.0–17.0)
Lymphocytes Relative: 31 % (ref 12–46)
Lymphs Abs: 3.1 10*3/uL (ref 0.7–4.0)
MCH: 30.6 pg (ref 26.0–34.0)
MCHC: 34.4 g/dL (ref 30.0–36.0)
MCV: 89 fL (ref 78.0–100.0)
Monocytes Absolute: 0.6 10*3/uL (ref 0.1–1.0)
Monocytes Relative: 6 % (ref 3–12)
Neutro Abs: 6 10*3/uL (ref 1.7–7.7)
Neutrophils Relative %: 60 % (ref 43–77)
Platelets: 200 10*3/uL (ref 150–400)
RBC: 4.83 MIL/uL (ref 4.22–5.81)
RDW: 12.9 % (ref 11.5–15.5)
WBC: 10 10*3/uL (ref 4.0–10.5)

## 2012-05-14 LAB — GLUCOSE, CAPILLARY
Glucose-Capillary: 104 mg/dL — ABNORMAL HIGH (ref 70–99)
Glucose-Capillary: 111 mg/dL — ABNORMAL HIGH (ref 70–99)
Glucose-Capillary: 97 mg/dL (ref 70–99)
Glucose-Capillary: 98 mg/dL (ref 70–99)

## 2012-05-14 MED ORDER — ONDANSETRON HCL 8 MG PO TABS
4.0000 mg | ORAL_TABLET | Freq: Four times a day (QID) | ORAL | Status: DC | PRN
  Filled 2012-05-14: qty 0.5

## 2012-05-14 MED ORDER — PROMETHAZINE HCL 12.5 MG PO TABS: 25.0000 mg | ORAL_TABLET | Freq: Four times a day (QID) | ORAL | Status: DC | PRN

## 2012-05-14 MED ORDER — LABETALOL HCL 5 MG/ML IV SOLN
10.0000 mg | INTRAVENOUS | Status: DC | PRN
  Filled 2012-05-14: qty 4

## 2012-05-14 MED ORDER — MORPHINE SULFATE 2 MG/ML IJ SOLN: 1.0000 mg | INTRAMUSCULAR | Status: DC | PRN

## 2012-05-14 MED ORDER — MOMETASONE FURO-FORMOTEROL FUM 100-5 MCG/ACT IN AERO
2.0000 | INHALATION_SPRAY | Freq: Two times a day (BID) | RESPIRATORY_TRACT | Status: DC
  Filled 2012-05-14 (×3): qty 8.8

## 2012-05-14 MED ORDER — SODIUM CHLORIDE 0.9 % IJ SOLN
3.0000 mL | Freq: Two times a day (BID) | INTRAMUSCULAR | Status: DC
  Administered 2012-05-14: 3 mL via INTRAVENOUS

## 2012-05-14 MED ORDER — ACETAMINOPHEN 325 MG PO TABS: 650.0000 mg | ORAL_TABLET | Freq: Four times a day (QID) | ORAL | Status: DC | PRN

## 2012-05-14 MED ORDER — SODIUM CHLORIDE 0.9 % IV SOLN
INTRAVENOUS | Status: AC
  Administered 2012-05-14: 1000 mL via INTRAVENOUS
  Administered 2012-05-14: 01:00:00 via INTRAVENOUS

## 2012-05-14 MED ORDER — ONDANSETRON HCL 4 MG/2ML IJ SOLN: 4.0000 mg | Freq: Four times a day (QID) | INTRAMUSCULAR | Status: DC | PRN

## 2012-05-14 MED ORDER — ONDANSETRON HCL 4 MG/2ML IJ SOLN: 4.0000 mg | Freq: Three times a day (TID) | INTRAMUSCULAR | Status: AC | PRN

## 2012-05-14 MED ORDER — SODIUM CHLORIDE 0.9 % IV SOLN
INTRAVENOUS | Status: AC
  Administered 2012-05-14: 01:00:00 via INTRAVENOUS

## 2012-05-14 MED ORDER — HYDROMORPHONE HCL PF 1 MG/ML IJ SOLN
1.0000 mg | INTRAMUSCULAR | Status: AC | PRN
  Administered 2012-05-14: 1 mg via INTRAVENOUS
  Filled 2012-05-14: qty 1

## 2012-05-14 MED ORDER — ACETAMINOPHEN 650 MG RE SUPP: 650.0000 mg | Freq: Four times a day (QID) | RECTAL | Status: DC | PRN

## 2012-05-14 NOTE — Consult Note (Signed)
Agree with PA -Osborne's PN -Pt havign bowel function, likely no SBO -pt asking to go home -OK from surgery standpoint to go home, no surgical plans at this time

## 2012-05-14 NOTE — Consult Note (Signed)
[name] Jun 14, 1966  [id number].   Requesting MD: Dr. Gean Birchwood Chief Complaint/Reason for Consult: ? SBO HPI: This is a 46 yo male with multiple medical problems and a complex abdominal surgery history.  He has had several hernia repairs with mesh in the past by Dr. Anthony Sar in [place].  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  He saw Dr. Barry Dienes in our office who referred him back to Dr. Anthony Sar who explanted the superior mesh earlier this year.  His wound has so far healed without any problems.  He does have an incisional hernia again at this time.   He began having abdominal pain several days ago.  He developed nausea and vomiting as well.  He continues to pass flatus, but no BM since being here.  He came to [facility] where he had a CT scan that shows mildly dilated small bowel, possibly c/w early SBO.  His hernia has transverse colon present, but not the site of the transition point.  We have been asked to see him for assistance with management.  Review of Systems:  Please see HPI, otherwise all other systems are negative  Family History  Problem Relation Age of Onset  . Breast cancer Maternal Aunt   . Lung cancer Father     Past Medical History  Diagnosis Date  . Mixed hyperlipidemia   . Essential hypertension, benign   . COPD (chronic obstructive pulmonary disease)   . Depression   . OSA (obstructive sleep apnea)   . Coronary atherosclerosis of native coronary artery     Multivessel, LVEF 45-50%  . 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 Stroke july 2013    Past Surgical History  Procedure Date  . Gastric bypass 2010  . Hernia repair 2011, 2012  . Toe amputation 1998  . Tonsilectomy, adenoidectomy, bilateral myringotomy and tubes   . Dental surgery 2003  . Cholecystectomy   . Coronary artery bypass graft 2010    LIMA to LAD, SVG to diagonal, SVG to OM1 and  OM 2, SVG to RCA    Social History:  reports that he has been smoking Cigarettes.  He has a 30 pack-year smoking history. He has never used smokeless tobacco. He reports that he does not drink alcohol or use illicit drugs.  Allergies:  Allergies  Allergen Reactions  . Contrast Media (Iodinated Diagnostic Agents)   . Ibuprofen     REACTION: swelling  . Nsaids     unknown    Medications Prior to Admission  Medication Sig Dispense Refill  . ALPRAZolam (XANAX) 1 MG tablet Take 1 mg by mouth 4 (four) times daily.       [name] amLODipine (NORVASC) 5 MG tablet Take 5 mg by mouth daily.      [name] aspirin 325 MG EC tablet Take 325 mg by mouth daily.      . carvedilol (COREG) 25 MG tablet Take 25 mg by mouth 2 (two) times daily with a meal.      . citalopram (CELEXA) 40 MG tablet Take 20 mg by mouth daily.       . clopidogrel (PLAVIX) 75 MG tablet Take 75 mg by mouth daily.      . fluticasone (FLONASE) 50 MCG/ACT nasal spray Place 2 sprays into the nose daily as needed. For dry nasal passages      . Fluticasone-Salmeterol (ADVAIR) 250-50 MCG/DOSE AEPB  Inhale 1 puff into the lungs every 12 (twelve) hours as needed. For shortness of breath      . isosorbide mononitrate (IMDUR) 60 MG 24 hr tablet Take 60 mg by mouth 2 (two) times daily.       [name] losartan-hydrochlorothiazide (HYZAAR) 100-25 MG per tablet Take 1 tablet by mouth daily.      . nitroGLYCERIN (NITROSTAT) 0.4 MG SL tablet Place 0.4 mg under the tongue every 5 (five) minutes as needed. For chest pain      . oxyCODONE-acetaminophen (PERCOCET/ROXICET) 5-325 MG per tablet Take 1 tablet by mouth every 6 (six) hours as needed for pain.  15 tablet  0  . traMADol (ULTRAM) 50 MG tablet Take 50 mg by mouth every 6 (six) hours as needed. For pain        Blood pressure 139/75, pulse 73, temperature 98.4 F (36.9 C), temperature source Oral, resp. rate 20, height 5\' 11"  (1.803 m), weight 270 lb 11.2 oz (122.789 kg), SpO2 96.00%. Physical Exam: General:  pleasant, obese white male who is sitting up in a chair in NAD HEENT: head is normocephalic, atraumatic.  Sclera are noninjected.  PERRL.  Ears and nose without any masses or lesions.  Mouth is pink and moist Heart: regular, rate, and rhythm.  Normal s1,s2. No obvious murmurs, gallops, or rubs noted.  Palpable radial and pedal pulses bilaterally Lungs: CTAB, no wheezes, rhonchi, or rales noted.  Respiratory effort nonlabored Abd: soft, mild diffuse tenderness, ND, obese, +BS, no masses or organomegaly.  He does have a soft reducible ventral incisional hernia noted. MS: all 4 extremities are symmetrical with no cyanosis, clubbing, or edema. Skin: warm and dry with no masses, lesions, or rashes Psych: A&Ox3 with an appropriate affect.    Results for orders placed during the hospital encounter of 05/13/12 (from the past 48 hour(s))  COMPREHENSIVE METABOLIC PANEL     Status: Normal   Collection Time   05/13/12  7:31 PM      Component Value Range Comment   Sodium 140  135 - 145 mEq/L    Potassium 3.9  3.5 - 5.1 mEq/L    Chloride 105  96 - 112 mEq/L    CO2 24  19 - 32 mEq/L    Glucose, Bld 90  70 - 99 mg/dL    BUN 16  6 - 23 mg/dL    Creatinine, Ser 1.00  0.50 - 1.35 mg/dL    Calcium 9.5  8.4 - 10.5 mg/dL    Total Protein 7.7  6.0 - 8.3 g/dL    Albumin 3.8  3.5 - 5.2 g/dL    AST 26  0 - 37 U/L    ALT 29  0 - 53 U/L    Alkaline Phosphatase 55  39 - 117 U/L    Total Bilirubin 0.3  0.3 - 1.2 mg/dL    GFR calc non Af Amer >90  >90 mL/min    GFR calc Af Amer >90  >90 mL/min   LIPASE, BLOOD     Status: Normal   Collection Time   05/13/12  7:31 PM      Component Value Range Comment   Lipase 48  11 - 59 U/L   CBC WITH DIFFERENTIAL     Status: Abnormal   Collection Time   05/13/12  7:31 PM      Component Value Range Comment   WBC 11.8 (*) 4.0 - 10.5 K/uL    RBC 5.17  4.22 - 5.81  MIL/uL    Hemoglobin 16.1  13.0 - 17.0 g/dL    HCT 45.7  39.0 - 52.0 %    MCV 88.4  78.0 - 100.0 fL    MCH  31.1  26.0 - 34.0 pg    MCHC 35.2  30.0 - 36.0 g/dL    RDW 12.8  11.5 - 15.5 %    Platelets 207  150 - 400 K/uL    Neutrophils Relative 57  43 - 77 %    Neutro Abs 6.7  1.7 - 7.7 K/uL    Lymphocytes Relative 32  12 - 46 %    Lymphs Abs 3.8  0.7 - 4.0 K/uL    Monocytes Relative 7  3 - 12 %    Monocytes Absolute 0.8  0.1 - 1.0 K/uL    Eosinophils Relative 3  0 - 5 %    Eosinophils Absolute 0.4  0.0 - 0.7 K/uL    Basophils Relative 1  0 - 1 %    Basophils Absolute 0.1  0.0 - 0.1 K/uL   GLUCOSE, CAPILLARY     Status: Normal   Collection Time   05/14/12 12:49 AM      Component Value Range Comment   Glucose-Capillary 97  70 - 99 mg/dL    Comment 1 Documented in Chart      Comment 2 Notify RN     GLUCOSE, CAPILLARY     Status: Abnormal   Collection Time   05/14/12  4:12 AM      Component Value Range Comment   Glucose-Capillary 104 (*) 70 - 99 mg/dL    Comment 1 Documented in Chart      Comment 2 Notify RN     COMPREHENSIVE METABOLIC PANEL     Status: Abnormal   Collection Time   05/14/12  5:15 AM      Component Value Range Comment   Sodium 138  135 - 145 mEq/L    Potassium 3.7  3.5 - 5.1 mEq/L    Chloride 104  96 - 112 mEq/L    CO2 24  19 - 32 mEq/L    Glucose, Bld 105 (*) 70 - 99 mg/dL    BUN 15  6 - 23 mg/dL    Creatinine, Ser 1.04  0.50 - 1.35 mg/dL    Calcium 8.6  8.4 - 10.5 mg/dL    Total Protein 6.6  6.0 - 8.3 g/dL    Albumin 3.2 (*) 3.5 - 5.2 g/dL    AST 72 (*) 0 - 37 U/L    ALT 63 (*) 0 - 53 U/L    Alkaline Phosphatase 48  39 - 117 U/L    Total Bilirubin 0.8  0.3 - 1.2 mg/dL    GFR calc non Af Amer 86 (*) >90 mL/min    GFR calc Af Amer >90  >90 mL/min   CBC WITH DIFFERENTIAL     Status: Normal   Collection Time   05/14/12  5:15 AM      Component Value Range Comment   WBC 10.0  4.0 - 10.5 K/uL    RBC 4.83  4.22 - 5.81 MIL/uL    Hemoglobin 14.8  13.0 - 17.0 g/dL    HCT 43.0  39.0 - 52.0 %    MCV 89.0  78.0 - 100.0 fL    MCH 30.6  26.0 - 34.0 pg    MCHC 34.4   30.0 - 36.0 g/dL    RDW 12.9  11.5 -  15.5 %    Platelets 200  150 - 400 K/uL    Neutrophils Relative 60  43 - 77 %    Neutro Abs 6.0  1.7 - 7.7 K/uL    Lymphocytes Relative 31  12 - 46 %    Lymphs Abs 3.1  0.7 - 4.0 K/uL    Monocytes Relative 6  3 - 12 %    Monocytes Absolute 0.6  0.1 - 1.0 K/uL    Eosinophils Relative 3  0 - 5 %    Eosinophils Absolute 0.3  0.0 - 0.7 K/uL    Basophils Relative 1  0 - 1 %    Basophils Absolute 0.1  0.0 - 0.1 K/uL   GLUCOSE, CAPILLARY     Status: Normal   Collection Time   05/14/12  7:38 AM      Component Value Range Comment   Glucose-Capillary 98  70 - 99 mg/dL    Ct Abdomen Pelvis Wo Contrast  05/13/2012  *RADIOLOGY REPORT*  Clinical Data: Fever and body aches.  Question hernia.  CT ABDOMEN AND PELVIS WITHOUT CONTRAST  Technique:  Multidetector CT imaging of the abdomen and pelvis was performed following the standard protocol without intravenous contrast.  Comparison: CT abdomen and pelvis 05/02/2004.  Findings: There is mild cardiomegaly.  No pleural or pericardial effusion.  Lung bases are clear.  The patient has a new midline hernia in the upper abdomen which contains a loop of transverse colon. Colon within the hernia is normal in appearance.  Hernia defect measures 6 cm transverse by 7.3 cm cranial-caudal.  No other hernia is identified.  The patient is status post cholecystectomy.  The liver is diffusely low attenuating consistent with fatty infiltration.  No focal liver lesion is identified.  The spleen, adrenal glands, pancreas and kidneys appear normal.  Urinary bladder, seminal vesicles and prostate gland appear normal.  The stomach is unremarkable.  There are some mildly dilated loops of small bowel in the right lower quadrant measuring up to 3.6 cm in diameter.  Small bowel feces sign is identified in the right lower quadrant with transition to completely decompressed small bowel loops. No pneumatosis, portal venous gas or free intraperitoneal air  is present.  The appendix is not visualized but no evidence of appendicitis is seen.  IMPRESSION:    1.  Findings suspicious for early small bowel obstruction due to adhesions with the transition point in the right lower quadrant.  2.  Midline ventral hernia in the upper abdomen contains a loop of transverse colon.  There is no evidence of incarceration or other complicating feature.  3.  Fatty infiltration of the liver.   Original Report Authenticated By: Arvid Right. Luther Parody, M.D.        Assessment/Plan 1. Abdominal pain 2. Ventral incisional hernia, reducible 3. Possible early PSBO 4. Tobacco abuse 5. HTN 6. H/o CVA  Plan: 1. Currently the patient is still nauseated, but not actively vomiting.  His CT scan does show some mildly dilated bowel, but not too impressive for a true SBO.  He is still passing flatus.  His hernia is soft and reducible and not the cause for his possible PSBO.  We would recommend conservative management for now with bowel rest.  Would defer NGT unless the patient began having emesis.  No surgical indications at this time.  We will follow with you.  Ebonee Stober E 05/14/2012, 8:16 AM Pager: [phone]

## 2012-05-14 NOTE — H&P (Signed)
[name] is an 46 y.o. male.  Patient was seen and examined on May 14, 2012. PCP - Dr. [name].  Chief Complaint: Abdominal pain with nausea and vomiting. HPI: 46 year-old male with history of CAD status post CABG, ongoing tobacco abuse with history of COPD, hypertension, CVA present with to the ER with complaints of abdominal pain. Patient has been experiencing abdominal pain for last 2 days which is mostly in the epigastric area was associated with nausea and vomiting. Patient has had bowel movements yesterday. In the ER CT abdomen and pelvis shows features consistent with early small bowel obstruction. At this time patient has been admitted for further management. Patient has not vomited in the ER for admission. Denies any chest pain or shortness of breath.  Past Medical History  Diagnosis Date  . Mixed hyperlipidemia   . Essential hypertension, benign   . COPD (chronic obstructive pulmonary disease)   . Depression   . OSA (obstructive sleep apnea)   . Coronary atherosclerosis of native coronary artery     Multivessel, LVEF 45-50%  . 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 Stroke july 2013    Past Surgical History  Procedure Date  . Gastric bypass 2010  . Hernia repair 2011, 2012  . Toe amputation 1998  . Tonsilectomy, adenoidectomy, bilateral myringotomy and tubes   . Dental surgery 2003  . Cholecystectomy   . Coronary artery bypass graft 2010    LIMA to LAD, SVG to diagonal, SVG to OM1 and OM 2, SVG to RCA    Family History  Problem Relation Age of Onset  . Breast cancer Maternal Aunt   . Lung cancer Father    Social History:  reports that he has been smoking Cigarettes.  He has a 30 pack-year smoking history. He has never used smokeless tobacco. He reports that he does not drink alcohol or use illicit drugs.  Allergies:  Allergies  Allergen Reactions  . Contrast Media  (Iodinated Diagnostic Agents)   . Ibuprofen     REACTION: swelling  . Nsaids     unknown     (Not in a hospital admission)  Results for orders placed during the hospital encounter of 05/13/12 (from the past 48 hour(s))  COMPREHENSIVE METABOLIC PANEL     Status: Normal   Collection Time   05/13/12  7:31 PM      Component Value Range Comment   Sodium 140  135 - 145 mEq/L    Potassium 3.9  3.5 - 5.1 mEq/L    Chloride 105  96 - 112 mEq/L    CO2 24  19 - 32 mEq/L    Glucose, Bld 90  70 - 99 mg/dL    BUN 16  6 - 23 mg/dL    Creatinine, Ser 1.00  0.50 - 1.35 mg/dL    Calcium 9.5  8.4 - 10.5 mg/dL    Total Protein 7.7  6.0 - 8.3 g/dL    Albumin 3.8  3.5 - 5.2 g/dL    AST 26  0 - 37 U/L    ALT 29  0 - 53 U/L    Alkaline Phosphatase 55  39 - 117 U/L    Total Bilirubin 0.3  0.3 - 1.2 mg/dL    GFR calc non Af Amer >90  >90 mL/min    GFR calc Af Amer >90  >90 mL/min   LIPASE, BLOOD  Status: Normal   Collection Time   05/13/12  7:31 PM      Component Value Range Comment   Lipase 48  11 - 59 U/L   CBC WITH DIFFERENTIAL     Status: Abnormal   Collection Time   05/13/12  7:31 PM      Component Value Range Comment   WBC 11.8 (*) 4.0 - 10.5 K/uL    RBC 5.17  4.22 - 5.81 MIL/uL    Hemoglobin 16.1  13.0 - 17.0 g/dL    HCT 45.7  39.0 - 52.0 %    MCV 88.4  78.0 - 100.0 fL    MCH 31.1  26.0 - 34.0 pg    MCHC 35.2  30.0 - 36.0 g/dL    RDW 12.8  11.5 - 15.5 %    Platelets 207  150 - 400 K/uL    Neutrophils Relative 57  43 - 77 %    Neutro Abs 6.7  1.7 - 7.7 K/uL    Lymphocytes Relative 32  12 - 46 %    Lymphs Abs 3.8  0.7 - 4.0 K/uL    Monocytes Relative 7  3 - 12 %    Monocytes Absolute 0.8  0.1 - 1.0 K/uL    Eosinophils Relative 3  0 - 5 %    Eosinophils Absolute 0.4  0.0 - 0.7 K/uL    Basophils Relative 1  0 - 1 %    Basophils Absolute 0.1  0.0 - 0.1 K/uL    Ct Abdomen Pelvis Wo Contrast  05/13/2012  *RADIOLOGY REPORT*  Clinical Data: Fever and body aches.  Question hernia.   CT ABDOMEN AND PELVIS WITHOUT CONTRAST  Technique:  Multidetector CT imaging of the abdomen and pelvis was performed following the standard protocol without intravenous contrast.  Comparison: CT abdomen and pelvis 05/02/2004.  Findings: There is mild cardiomegaly.  No pleural or pericardial effusion.  Lung bases are clear.  The patient has a new midline hernia in the upper abdomen which contains a loop of transverse colon. Colon within the hernia is normal in appearance.  Hernia defect measures 6 cm transverse by 7.3 cm cranial-caudal.  No other hernia is identified.  The patient is status post cholecystectomy.  The liver is diffusely low attenuating consistent with fatty infiltration.  No focal liver lesion is identified.  The spleen, adrenal glands, pancreas and kidneys appear normal.  Urinary bladder, seminal vesicles and prostate gland appear normal.  The stomach is unremarkable.  There are some mildly dilated loops of small bowel in the right lower quadrant measuring up to 3.6 cm in diameter.  Small bowel feces sign is identified in the right lower quadrant with transition to completely decompressed small bowel loops. No pneumatosis, portal venous gas or free intraperitoneal air is present.  The appendix is not visualized but no evidence of appendicitis is seen.  IMPRESSION:    1.  Findings suspicious for early small bowel obstruction due to adhesions with the transition point in the right lower quadrant.  2.  Midline ventral hernia in the upper abdomen contains a loop of transverse colon.  There is no evidence of incarceration or other complicating feature.  3.  Fatty infiltration of the liver.   Original Report Authenticated By: Arvid Right. Luther Parody, M.D.     Review of Systems  Constitutional: Negative.   HENT: Negative.   Eyes: Negative.   Respiratory: Negative.   Cardiovascular: Negative.   Gastrointestinal: Positive for nausea, vomiting and abdominal  pain.  Genitourinary: Negative.     Musculoskeletal: Negative.   Skin: Negative.   Neurological: Negative.   Endo/Heme/Allergies: Negative.   Psychiatric/Behavioral: Negative.     Blood pressure 160/89, pulse 72, temperature 98.2 F (36.8 C), temperature source Oral, resp. rate 20, SpO2 96.00%. Physical Exam  Constitutional: He is oriented to person, place, and time. He appears well-developed and well-nourished. No distress.  HENT:  Head: Normocephalic and atraumatic.  Right Ear: External ear normal.  Left Ear: External ear normal.  Nose: Nose normal.  Mouth/Throat: Oropharynx is clear and moist. No oropharyngeal exudate.  Eyes: Conjunctivae normal are normal. Pupils are equal, round, and reactive to light. Right eye exhibits no discharge. Left eye exhibits no discharge. No scleral icterus.  Neck: Normal range of motion. Neck supple.  Cardiovascular: Normal rate and regular rhythm.   Respiratory: Effort normal and breath sounds normal. No respiratory distress. He has no wheezes. He has no rales.  GI: Soft. Bowel sounds are normal. He exhibits no distension. There is no tenderness. There is no rebound.  Musculoskeletal: He exhibits no edema and no tenderness.  Neurological: He is alert and oriented to person, place, and time.       Moves all extremities.  Skin: Skin is warm and dry. He is not diaphoretic.     Assessment/Plan #1. Early small bowel obstruction - keep patient n.p.o. Gentle hydration. Surgery Dr. Hulen Skains was consulted and will follow their recommendations. #2. CAD status post CABG and stenting - denies any chest this time. #3. Hypertension - since patient is n.p.o. we'll keep patient on when necessary IV antihypertensives for systolic blood pressure was 160. #4. COPD and tobacco abuse - advised to quit smoking. Presently not wheezing continue home inhalers. #5. Hyperlipidemia - continue statins when patient can take orally.  CODE STATUS - full code.   Kailani Brass N. 05/14/2012, 12:01 AM

## 2012-05-14 NOTE — Discharge Summary (Addendum)
Physician Discharge Summary  [name] MRN: [id number] DOB/AGE: 1965/10/19 46 y.o.  PCP: Gar Ponto, MD   Admit date: 05/13/2012 Discharge date: 05/14/2012  Discharge Diagnoses:  Early *SBO (small bowel obstruction) Active Problems:  TOBACCO ABUSE  Essential hypertension, benign  CORONARY ATHEROSCLEROSIS NATIVE CORONARY ARTERY     Medication List     As of 05/14/2012  9:16 AM    TAKE these medications         ALPRAZolam 1 MG tablet   Commonly known as: XANAX   Take 1 mg by mouth 4 (four) times daily.      amLODipine 5 MG tablet   Commonly known as: NORVASC   Take 5 mg by mouth daily.      aspirin 325 MG EC tablet   Take 325 mg by mouth daily.      carvedilol 25 MG tablet   Commonly known as: COREG   Take 25 mg by mouth 2 (two) times daily with a meal.      citalopram 40 MG tablet   Commonly known as: CELEXA   Take 20 mg by mouth daily.      clopidogrel 75 MG tablet   Commonly known as: PLAVIX   Take 75 mg by mouth daily.      fluticasone 50 MCG/ACT nasal spray   Commonly known as: FLONASE   Place 2 sprays into the nose daily as needed. For dry nasal passages      Fluticasone-Salmeterol 250-50 MCG/DOSE Aepb   Commonly known as: ADVAIR   Inhale 1 puff into the lungs every 12 (twelve) hours as needed. For shortness of breath      isosorbide mononitrate 60 MG 24 hr tablet   Commonly known as: IMDUR   Take 60 mg by mouth 2 (two) times daily.      losartan-hydrochlorothiazide 100-25 MG per tablet   Commonly known as: HYZAAR   Take 1 tablet by mouth daily.      nitroGLYCERIN 0.4 MG SL tablet   Commonly known as: NITROSTAT   Place 0.4 mg under the tongue every 5 (five) minutes as needed. For chest pain      oxyCODONE-acetaminophen 5-325 MG per tablet   Commonly known as: PERCOCET/ROXICET   Take 1 tablet by mouth every 6 (six) hours as needed for pain.      traMADol 50 MG tablet   Commonly known as: ULTRAM   Take 50 mg by mouth  every 6 (six) hours as needed. For pain        Discharge Condition: Stable  Disposition: 01-Home or Self Care   Consults:    Significant Diagnostic Studies: Ct Abdomen Pelvis Wo Contrast  05/13/2012  *RADIOLOGY REPORT*  Clinical Data: Fever and body aches.  Question hernia.  CT ABDOMEN AND PELVIS WITHOUT CONTRAST  Technique:  Multidetector CT imaging of the abdomen and pelvis was performed following the standard protocol without intravenous contrast.  Comparison: CT abdomen and pelvis 05/02/2004.  Findings: There is mild cardiomegaly.  No pleural or pericardial effusion.  Lung bases are clear.  The patient has a new midline hernia in the upper abdomen which contains a loop of transverse colon. Colon within the hernia is normal in appearance.  Hernia defect measures 6 cm transverse by 7.3 cm cranial-caudal.  No other hernia is identified.  The patient is status post cholecystectomy.  The liver is diffusely low attenuating consistent with fatty infiltration.  No focal liver lesion is identified.  The spleen, adrenal  glands, pancreas and kidneys appear normal.  Urinary bladder, seminal vesicles and prostate gland appear normal.  The stomach is unremarkable.  There are some mildly dilated loops of small bowel in the right lower quadrant measuring up to 3.6 cm in diameter.  Small bowel feces sign is identified in the right lower quadrant with transition to completely decompressed small bowel loops. No pneumatosis, portal venous gas or free intraperitoneal air is present.  The appendix is not visualized but no evidence of appendicitis is seen.  IMPRESSION:    1.  Findings suspicious for early small bowel obstruction due to adhesions with the transition point in the right lower quadrant.  2.  Midline ventral hernia in the upper abdomen contains a loop of transverse colon.  There is no evidence of incarceration or other complicating feature.  3.  Fatty infiltration of the liver.   Original Report  Authenticated By: Arvid Right. Luther Parody, M.D.      Microbiology: No results found for this or any previous visit (from the past 240 hour(s)).   Labs: Results for orders placed during the hospital encounter of 05/13/12 (from the past 48 hour(s))  COMPREHENSIVE METABOLIC PANEL     Status: Normal   Collection Time   05/13/12  7:31 PM      Component Value Range Comment   Sodium 140  135 - 145 mEq/L    Potassium 3.9  3.5 - 5.1 mEq/L    Chloride 105  96 - 112 mEq/L    CO2 24  19 - 32 mEq/L    Glucose, Bld 90  70 - 99 mg/dL    BUN 16  6 - 23 mg/dL    Creatinine, Ser 1.00  0.50 - 1.35 mg/dL    Calcium 9.5  8.4 - 10.5 mg/dL    Total Protein 7.7  6.0 - 8.3 g/dL    Albumin 3.8  3.5 - 5.2 g/dL    AST 26  0 - 37 U/L    ALT 29  0 - 53 U/L    Alkaline Phosphatase 55  39 - 117 U/L    Total Bilirubin 0.3  0.3 - 1.2 mg/dL    GFR calc non Af Amer >90  >90 mL/min    GFR calc Af Amer >90  >90 mL/min   LIPASE, BLOOD     Status: Normal   Collection Time   05/13/12  7:31 PM      Component Value Range Comment   Lipase 48  11 - 59 U/L   CBC WITH DIFFERENTIAL     Status: Abnormal   Collection Time   05/13/12  7:31 PM      Component Value Range Comment   WBC 11.8 (*) 4.0 - 10.5 K/uL    RBC 5.17  4.22 - 5.81 MIL/uL    Hemoglobin 16.1  13.0 - 17.0 g/dL    HCT 45.7  39.0 - 52.0 %    MCV 88.4  78.0 - 100.0 fL    MCH 31.1  26.0 - 34.0 pg    MCHC 35.2  30.0 - 36.0 g/dL    RDW 12.8  11.5 - 15.5 %    Platelets 207  150 - 400 K/uL    Neutrophils Relative 57  43 - 77 %    Neutro Abs 6.7  1.7 - 7.7 K/uL    Lymphocytes Relative 32  12 - 46 %    Lymphs Abs 3.8  0.7 - 4.0 K/uL    Monocytes Relative 7  3 - 12 %    Monocytes Absolute 0.8  0.1 - 1.0 K/uL    Eosinophils Relative 3  0 - 5 %    Eosinophils Absolute 0.4  0.0 - 0.7 K/uL    Basophils Relative 1  0 - 1 %    Basophils Absolute 0.1  0.0 - 0.1 K/uL   GLUCOSE, CAPILLARY     Status: Normal   Collection Time   05/14/12 12:49 AM      Component Value  Range Comment   Glucose-Capillary 97  70 - 99 mg/dL    Comment 1 Documented in Chart      Comment 2 Notify RN     GLUCOSE, CAPILLARY     Status: Abnormal   Collection Time   05/14/12  4:12 AM      Component Value Range Comment   Glucose-Capillary 104 (*) 70 - 99 mg/dL    Comment 1 Documented in Chart      Comment 2 Notify RN     COMPREHENSIVE METABOLIC PANEL     Status: Abnormal   Collection Time   05/14/12  5:15 AM      Component Value Range Comment   Sodium 138  135 - 145 mEq/L    Potassium 3.7  3.5 - 5.1 mEq/L    Chloride 104  96 - 112 mEq/L    CO2 24  19 - 32 mEq/L    Glucose, Bld 105 (*) 70 - 99 mg/dL    BUN 15  6 - 23 mg/dL    Creatinine, Ser 1.04  0.50 - 1.35 mg/dL    Calcium 8.6  8.4 - 10.5 mg/dL    Total Protein 6.6  6.0 - 8.3 g/dL    Albumin 3.2 (*) 3.5 - 5.2 g/dL    AST 72 (*) 0 - 37 U/L    ALT 63 (*) 0 - 53 U/L    Alkaline Phosphatase 48  39 - 117 U/L    Total Bilirubin 0.8  0.3 - 1.2 mg/dL    GFR calc non Af Amer 86 (*) >90 mL/min    GFR calc Af Amer >90  >90 mL/min   CBC WITH DIFFERENTIAL     Status: Normal   Collection Time   05/14/12  5:15 AM      Component Value Range Comment   WBC 10.0  4.0 - 10.5 K/uL    RBC 4.83  4.22 - 5.81 MIL/uL    Hemoglobin 14.8  13.0 - 17.0 g/dL    HCT 43.0  39.0 - 52.0 %    MCV 89.0  78.0 - 100.0 fL    MCH 30.6  26.0 - 34.0 pg    MCHC 34.4  30.0 - 36.0 g/dL    RDW 12.9  11.5 - 15.5 %    Platelets 200  150 - 400 K/uL    Neutrophils Relative 60  43 - 77 %    Neutro Abs 6.0  1.7 - 7.7 K/uL    Lymphocytes Relative 31  12 - 46 %    Lymphs Abs 3.1  0.7 - 4.0 K/uL    Monocytes Relative 6  3 - 12 %    Monocytes Absolute 0.6  0.1 - 1.0 K/uL    Eosinophils Relative 3  0 - 5 %    Eosinophils Absolute 0.3  0.0 - 0.7 K/uL    Basophils Relative 1  0 - 1 %    Basophils Absolute 0.1  0.0 - 0.1 K/uL  GLUCOSE, CAPILLARY     Status: Normal   Collection Time   05/14/12  7:38 AM      Component Value Range Comment   Glucose-Capillary 98   70 - 99 mg/dL      HPI : 46 year-old male with history of CAD status post CABG, ongoing tobacco abuse with history of COPD, hypertension, CVA present with to the ER with complaints of abdominal pain. Patient has been experiencing abdominal pain for last 2 days which is mostly in the epigastric area was associated with nausea and vomiting. Patient has had bowel movements yesterday. In the ER CT abdomen and pelvis shows features consistent with early small bowel obstruction. At this time patient has been admitted for further management. Patient has not vomited in the ER for admission. Denies any chest pain or shortness of breath   HOSPITAL COURSE: *  1. Early small bowel obstruction - kept n.p.o.. Gentle hydration. Surgery Dr. Hulen Skains was consulted . He has a history of multiple medical problems and a complex abdominal surgery history. He has had several hernia repairs with mesh in the past by Dr. Anthony Sar in [place]. 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 He saw Dr. Barry Dienes in our office who referred him back to Dr. Anthony Sar who explanted the superior mesh earlier this year. His wound has so far healed without any problems. He does have an incisional hernia again at this time.  He began having abdominal pain several days ago. He developed nausea and vomiting as well. He continues to pass flatus, but no BM since being here. He came to [facility] where he had a CT scan that shows mildly dilated small bowel, possibly c/w early SBO. His hernia has transverse colon present, but not the site of the transition point.  Patient was somewhat nauseated but did not have any active vomiting. Surgery found that the patient's hernia was soft and reducible, not the cause of his partial small bowel obstruction No indication for NG tube Patient seen by general Rae Roam, MD who recommends that the patient can discharge home, and there is no evidence of small bowel obstruction at this time,  Diet will be advanced if tolerated patient will go home .    #2. CAD status post CABG and stenting - denies any chest this time.  #3. Hypertension - resume outpatient medications #4. COPD and tobacco abuse - advised to quit smoking. Presently not wheezing continue home inhalers.  #5. Hyperlipidemia - continue statins when patient can take orally.    Discharge Exam:  Blood pressure 139/75, pulse 73, temperature 98.4 F (36.9 C), temperature source Oral, resp. rate 20, height 5\' 11"  (1.803 m), weight 122.789 kg (270 lb 11.2 oz), SpO2 96.00%.  Head: Normocephalic and atraumatic.  Right Ear: External ear normal.  Left Ear: External ear normal.  Nose: Nose normal.  Mouth/Throat: Oropharynx is clear and moist. No oropharyngeal exudate.  Eyes: Conjunctivae normal are normal. Pupils are equal, round, and reactive to light. Right eye exhibits no discharge. Left eye exhibits no discharge. No scleral icterus.  Neck: Normal range of motion. Neck supple.  Cardiovascular: Normal rate and regular rhythm.  Respiratory: Effort normal and breath sounds normal. No respiratory distress. He has no wheezes. He has no rales.  GI: Soft. Bowel sounds are normal. He exhibits no distension. There is no tenderness. There is no rebound.  Musculoskeletal: He exhibits no edema and no tenderness.  Neurological: He is alert and oriented to person, place, and  time.  Moves all extremities.  Skin: Skin is warm and dry. He is not diaphoretic.            Discharge Orders    Future Appointments: Provider: Department: Dept Phone: Center:   05/22/2012 10:30 AM Stark Klein, MD [facility] [phone] None        Signed: Reyne Dumas 05/14/2012, 9:16 AM

## 2012-05-15 NOTE — ED Provider Notes (Signed)
Medical screening examination/treatment/procedure(s) were conducted as a shared visit with non-physician practitioner(s) and myself.  I personally evaluated the patient during the encounter.   45yoM, c/o gradual onset and worsening of persistent abd pain as well as N/V over the past week.  Has hx of hernia repair with mesh, then had 2nd surgery to remove the mesh several years ago.  Denies fevers, no diarrhea, no back pain.  VSS, afebrile, A&O, CTA, RRR, abd diffusely TTP.  CT A/P with SBO.  T/C to General Surgery Dr. Hulen Skains, case discussed, including:  HPI, pertinent PM/SHx, VS/PE, dx testing, ED course and treatment:  Has reviewed CT scan, states this is not an acute surgical issue at this time, will consult in the morning.  [facility] Dr. Hal Hope made aware, is agreeable to admit.    Alfonzo Feller, DO 05/15/12 [id number]

## 2012-05-22 ENCOUNTER — Encounter (INDEPENDENT_AMBULATORY_CARE_PROVIDER_SITE_OTHER): Payer: Medicaid Other | Admitting: General Surgery

## 2012-06-03 DIAGNOSIS — I10 Essential (primary) hypertension: Secondary | ICD-10-CM | POA: Diagnosis not present

## 2012-06-03 DIAGNOSIS — E782 Mixed hyperlipidemia: Secondary | ICD-10-CM | POA: Diagnosis not present

## 2012-06-03 DIAGNOSIS — E119 Type 2 diabetes mellitus without complications: Secondary | ICD-10-CM | POA: Diagnosis not present

## 2012-06-08 DIAGNOSIS — K432 Incisional hernia without obstruction or gangrene: Secondary | ICD-10-CM | POA: Diagnosis not present

## 2012-06-10 DIAGNOSIS — IMO0001 Reserved for inherently not codable concepts without codable children: Secondary | ICD-10-CM | POA: Diagnosis not present

## 2012-06-10 DIAGNOSIS — E782 Mixed hyperlipidemia: Secondary | ICD-10-CM | POA: Diagnosis not present

## 2012-06-10 DIAGNOSIS — E669 Obesity, unspecified: Secondary | ICD-10-CM | POA: Diagnosis not present

## 2012-06-10 DIAGNOSIS — G4733 Obstructive sleep apnea (adult) (pediatric): Secondary | ICD-10-CM | POA: Diagnosis not present

## 2012-06-10 DIAGNOSIS — J309 Allergic rhinitis, unspecified: Secondary | ICD-10-CM | POA: Diagnosis not present

## 2012-06-10 DIAGNOSIS — IMO0002 Reserved for concepts with insufficient information to code with codable children: Secondary | ICD-10-CM | POA: Diagnosis not present

## 2012-06-10 DIAGNOSIS — F17200 Nicotine dependence, unspecified, uncomplicated: Secondary | ICD-10-CM | POA: Diagnosis not present

## 2012-06-10 DIAGNOSIS — I10 Essential (primary) hypertension: Secondary | ICD-10-CM | POA: Diagnosis not present

## 2012-06-23 DIAGNOSIS — L723 Sebaceous cyst: Secondary | ICD-10-CM | POA: Diagnosis not present

## 2012-06-26 DIAGNOSIS — L723 Sebaceous cyst: Secondary | ICD-10-CM | POA: Diagnosis not present

## 2012-07-14 DIAGNOSIS — E119 Type 2 diabetes mellitus without complications: Secondary | ICD-10-CM | POA: Diagnosis not present

## 2012-07-14 DIAGNOSIS — E7800 Pure hypercholesterolemia, unspecified: Secondary | ICD-10-CM | POA: Diagnosis not present

## 2012-07-14 DIAGNOSIS — E782 Mixed hyperlipidemia: Secondary | ICD-10-CM | POA: Diagnosis not present

## 2012-07-17 DIAGNOSIS — IMO0002 Reserved for concepts with insufficient information to code with codable children: Secondary | ICD-10-CM | POA: Diagnosis not present

## 2012-07-17 DIAGNOSIS — F17200 Nicotine dependence, unspecified, uncomplicated: Secondary | ICD-10-CM | POA: Diagnosis not present

## 2012-07-17 DIAGNOSIS — E782 Mixed hyperlipidemia: Secondary | ICD-10-CM | POA: Diagnosis not present

## 2012-07-17 DIAGNOSIS — IMO0001 Reserved for inherently not codable concepts without codable children: Secondary | ICD-10-CM | POA: Diagnosis not present

## 2012-07-17 DIAGNOSIS — E669 Obesity, unspecified: Secondary | ICD-10-CM | POA: Diagnosis not present

## 2012-07-17 DIAGNOSIS — G4733 Obstructive sleep apnea (adult) (pediatric): Secondary | ICD-10-CM | POA: Diagnosis not present

## 2012-07-17 DIAGNOSIS — J309 Allergic rhinitis, unspecified: Secondary | ICD-10-CM | POA: Diagnosis not present

## 2012-07-17 DIAGNOSIS — I10 Essential (primary) hypertension: Secondary | ICD-10-CM | POA: Diagnosis not present

## 2012-09-03 DIAGNOSIS — I10 Essential (primary) hypertension: Secondary | ICD-10-CM | POA: Diagnosis not present

## 2012-09-03 DIAGNOSIS — J309 Allergic rhinitis, unspecified: Secondary | ICD-10-CM | POA: Diagnosis not present

## 2012-09-03 DIAGNOSIS — IMO0002 Reserved for concepts with insufficient information to code with codable children: Secondary | ICD-10-CM | POA: Diagnosis not present

## 2012-09-03 DIAGNOSIS — IMO0001 Reserved for inherently not codable concepts without codable children: Secondary | ICD-10-CM | POA: Diagnosis not present

## 2012-09-03 DIAGNOSIS — E782 Mixed hyperlipidemia: Secondary | ICD-10-CM | POA: Diagnosis not present

## 2012-09-03 DIAGNOSIS — F17200 Nicotine dependence, unspecified, uncomplicated: Secondary | ICD-10-CM | POA: Diagnosis not present

## 2012-09-03 DIAGNOSIS — E669 Obesity, unspecified: Secondary | ICD-10-CM | POA: Diagnosis not present

## 2012-09-03 DIAGNOSIS — G4733 Obstructive sleep apnea (adult) (pediatric): Secondary | ICD-10-CM | POA: Diagnosis not present

## 2012-09-23 DIAGNOSIS — E119 Type 2 diabetes mellitus without complications: Secondary | ICD-10-CM | POA: Diagnosis not present

## 2012-09-23 DIAGNOSIS — Z79899 Other long term (current) drug therapy: Secondary | ICD-10-CM | POA: Diagnosis not present

## 2012-09-23 DIAGNOSIS — I10 Essential (primary) hypertension: Secondary | ICD-10-CM | POA: Diagnosis not present

## 2012-09-23 DIAGNOSIS — F17200 Nicotine dependence, unspecified, uncomplicated: Secondary | ICD-10-CM | POA: Diagnosis not present

## 2012-09-23 DIAGNOSIS — Z951 Presence of aortocoronary bypass graft: Secondary | ICD-10-CM | POA: Diagnosis not present

## 2012-09-23 DIAGNOSIS — M109 Gout, unspecified: Secondary | ICD-10-CM | POA: Diagnosis not present

## 2012-09-23 DIAGNOSIS — M79609 Pain in unspecified limb: Secondary | ICD-10-CM | POA: Diagnosis not present

## 2012-09-23 DIAGNOSIS — Z7902 Long term (current) use of antithrombotics/antiplatelets: Secondary | ICD-10-CM | POA: Diagnosis not present

## 2012-09-23 DIAGNOSIS — Z7982 Long term (current) use of aspirin: Secondary | ICD-10-CM | POA: Diagnosis not present

## 2012-10-02 DIAGNOSIS — R112 Nausea with vomiting, unspecified: Secondary | ICD-10-CM | POA: Diagnosis not present

## 2012-10-02 DIAGNOSIS — F17200 Nicotine dependence, unspecified, uncomplicated: Secondary | ICD-10-CM | POA: Diagnosis not present

## 2012-10-02 DIAGNOSIS — R0989 Other specified symptoms and signs involving the circulatory and respiratory systems: Secondary | ICD-10-CM | POA: Diagnosis not present

## 2012-10-02 DIAGNOSIS — Z951 Presence of aortocoronary bypass graft: Secondary | ICD-10-CM | POA: Diagnosis not present

## 2012-10-02 DIAGNOSIS — R109 Unspecified abdominal pain: Secondary | ICD-10-CM | POA: Diagnosis not present

## 2012-10-02 DIAGNOSIS — Z7982 Long term (current) use of aspirin: Secondary | ICD-10-CM | POA: Diagnosis not present

## 2012-10-02 DIAGNOSIS — K439 Ventral hernia without obstruction or gangrene: Secondary | ICD-10-CM | POA: Diagnosis not present

## 2012-10-02 DIAGNOSIS — I10 Essential (primary) hypertension: Secondary | ICD-10-CM | POA: Diagnosis not present

## 2012-10-02 DIAGNOSIS — E119 Type 2 diabetes mellitus without complications: Secondary | ICD-10-CM | POA: Diagnosis not present

## 2012-10-02 DIAGNOSIS — Z7902 Long term (current) use of antithrombotics/antiplatelets: Secondary | ICD-10-CM | POA: Diagnosis not present

## 2012-10-02 DIAGNOSIS — Z79899 Other long term (current) drug therapy: Secondary | ICD-10-CM | POA: Diagnosis not present

## 2012-10-02 DIAGNOSIS — K7689 Other specified diseases of liver: Secondary | ICD-10-CM | POA: Diagnosis not present

## 2012-10-02 DIAGNOSIS — R1033 Periumbilical pain: Secondary | ICD-10-CM | POA: Diagnosis not present

## 2012-10-08 DIAGNOSIS — K439 Ventral hernia without obstruction or gangrene: Secondary | ICD-10-CM | POA: Diagnosis not present

## 2012-10-12 ENCOUNTER — Encounter: Payer: Self-pay | Admitting: *Deleted

## 2012-10-12 ENCOUNTER — Encounter: Payer: Self-pay | Admitting: Physician Assistant

## 2012-10-12 ENCOUNTER — Other Ambulatory Visit: Payer: Self-pay | Admitting: *Deleted

## 2012-10-12 ENCOUNTER — Ambulatory Visit (INDEPENDENT_AMBULATORY_CARE_PROVIDER_SITE_OTHER): Payer: Medicare Other | Admitting: Physician Assistant

## 2012-10-12 VITALS — BP 124/71 | HR 79 | Ht 71.0 in | Wt 268.1 lb

## 2012-10-12 DIAGNOSIS — Z01810 Encounter for preprocedural cardiovascular examination: Secondary | ICD-10-CM

## 2012-10-12 DIAGNOSIS — I10 Essential (primary) hypertension: Secondary | ICD-10-CM

## 2012-10-12 DIAGNOSIS — I2510 Atherosclerotic heart disease of native coronary artery without angina pectoris: Secondary | ICD-10-CM | POA: Diagnosis not present

## 2012-10-12 DIAGNOSIS — E782 Mixed hyperlipidemia: Secondary | ICD-10-CM | POA: Diagnosis not present

## 2012-10-12 DIAGNOSIS — F17200 Nicotine dependence, unspecified, uncomplicated: Secondary | ICD-10-CM

## 2012-10-12 MED ORDER — ASPIRIN EC 81 MG PO TBEC: 81.0000 mg | DELAYED_RELEASE_TABLET | Freq: Every day | ORAL | Status: DC

## 2012-10-12 NOTE — Assessment & Plan Note (Signed)
Complete smoking cessation strongly emphasized

## 2012-10-12 NOTE — Assessment & Plan Note (Signed)
Patient is now 4 years out since undergoing multivessel CABG in February 2010. He has not had any subsequent ischemic evaluation. We will order a Lexiscan stress Cardiolite and, if this is negative for ischemia, he is cleared then to proceed with abdominal surgery, as planned. He is to hold Plavix for 5 days prior to surgery, and then resume once cleared to do so. Will decrease ASA 81 mg daily as of today, and which he can then continue indefinitely. Of note, I will also order a complete echocardiogram for reassessment of LVF. This was previously assessed as EF 33 % by nuclear imaging, and EF 45-50% by echocardiography, both in April 2009.

## 2012-10-12 NOTE — Patient Instructions (Addendum)
   Decrease Aspirin to 81mg  daily  Hold Plavix x 5 days prior to surgery, resume when surgeon feels safe for you  Lexiscan Cardiolite stress test  Echo  Office will notify of results Your physician wants you to follow up in: 6 months.  You will receive a reminder letter in the mail one-two months in advance.  If you don't receive a letter, please call our office to schedule the follow up appointment

## 2012-10-12 NOTE — Progress Notes (Addendum)
Primary Cardiologist: Johnny Bridge, MD   HPI: Patient referred for cardiac clearance, last seen here in clinic in July 2013, by Dr. Domenic Polite. No medication adjustments made at that time. Patient was referred for a 7 day event monitor to rule out dysrhythmia, in light of recent stroke. The study was reviewed by Dr. Domenic Polite, who noted no evidence of atrial fibrillation.  Patient has been recently diagnosed with an abdominal incisional hernia, for which surgical repair has been recommended. This will take place at [facility], by Dr. Ernesta Amble. Request has been made for patient to hold Plavix 5 days beforehand, and to reduce ASA to 81 mg daily.  Clinically, patient denies any interim development of exertional CP or SOB. He can climb a full flight of stairs, with no associated symptoms. Regarding tobacco smoking, he has curtailed this significantly, currently about less than 1/2 ppd. He previously smoked as much as 2 ppd.   12-lead EKG today, reviewed by me, indicates NSR 80 bpm; question prior IMI/ASMI; chronic T wave inversion in the high lateral/lateral leads  Allergies  Allergen Reactions  . Contrast Media (Iodinated Diagnostic Agents)   . Ibuprofen     REACTION: swelling  . Nsaids     unknown    Current Outpatient Prescriptions  Medication Sig Dispense Refill  . ALPRAZolam (XANAX) 1 MG tablet Take 1 mg by mouth 4 (four) times daily.       . citalopram (CELEXA) 40 MG tablet Take 20 mg by mouth daily.       . clopidogrel (PLAVIX) 75 MG tablet Take 75 mg by mouth daily.      . fluticasone (FLONASE) 50 MCG/ACT nasal spray Place 2 sprays into the nose daily as needed. For dry nasal passages      . isosorbide mononitrate (IMDUR) 60 MG 24 hr tablet Take 60 mg by mouth 2 (two) times daily.       [name] losartan-hydrochlorothiazide (HYZAAR) 100-25 MG per tablet Take 1 tablet by mouth daily.      . nitroGLYCERIN (NITROSTAT) 0.4 MG SL tablet Place 0.4 mg under the tongue every 5 (five)  minutes as needed. For chest pain      . traMADol (ULTRAM) 50 MG tablet Take 50 mg by mouth every 6 (six) hours as needed. For pain      . aspirin EC 81 MG tablet Take 1 tablet (81 mg total) by mouth daily.       No current facility-administered medications for this visit.    Past Medical History  Diagnosis Date  . Mixed hyperlipidemia   . Essential hypertension, benign   . COPD (chronic obstructive pulmonary disease)   . Depression   . OSA (obstructive sleep apnea)   . Coronary atherosclerosis of native coronary artery     Multivessel, LVEF 45-50%, echo, 10/2007  . 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 Stroke july 2013    Past Surgical History  Procedure Laterality Date  . Gastric bypass  2010  . Hernia repair  2011, 2012  . Toe amputation  1998  . Tonsilectomy, adenoidectomy, bilateral myringotomy and tubes    . Dental surgery  2003  . Cholecystectomy    . Coronary artery bypass graft  2010    LIMA to LAD, SVG to diagonal, SVG to OM1 and OM 2, SVG to RCA    History   Social History  . Marital Status: Married  Spouse Name: N/A    Number of Children: N/A  . Years of Education: N/A   Occupational History  . Not on file.   Social History Main Topics  . Smoking status: Current Every Day Smoker -- 0.75 packs/day    Types: Cigarettes  . Smokeless tobacco: Never Used     Comment: trying to cut back   . Alcohol Use: No  . Drug Use: No  . Sexually Active: Not on file   Other Topics Concern  . Not on file   Social History Narrative  . No narrative on file    Family History  Problem Relation Age of Onset  . Breast cancer Maternal Aunt   . Lung cancer Father     ROS: no nausea, vomiting; no fever, chills; no melena, hematochezia; no claudication  PHYSICAL EXAM: BP 124/71  Pulse 79  Ht 5\' 11"  (1.803 m)  Wt 268 lb 1.9 oz (121.618 kg)  BMI 37.41 kg/m2  SpO2 98% GENERAL: 47  year-old male, moderately obese; NAD HEENT: NCAT, PERRLA, EOMI; sclera clear; no xanthelasma NECK: palpable bilateral carotid pulses, no bruits; no JVD; no TM LUNGS: CTA bilaterally CARDIAC: RRR (S1, S2); no significant murmurs; no rubs or gallops ABDOMEN: soft, protuberant EXTREMETIES: no significant peripheral edema SKIN: warm/dry; no obvious rash/lesions MUSCULOSKELETAL: no joint deformity NEURO: no focal deficit; NL affect   EKG: reviewed and available in Electronic Records   ASSESSMENT & PLAN:  CORONARY ATHEROSCLEROSIS NATIVE CORONARY ARTERY Patient is now 4 years out since undergoing multivessel CABG in February 2010. He has not had any subsequent ischemic evaluation. We will order a Lexiscan stress Cardiolite and, if this is negative for ischemia, he is cleared then to proceed with abdominal surgery, as planned. He is to hold Plavix for 5 days prior to surgery, and then resume once cleared to do so. Will decrease ASA to 81 mg daily as of today, and which he can then continue indefinitely. Of note, I will also order a complete echocardiogram for reassessment of LVF. This was previously assessed as EF 33 % by nuclear imaging, and EF 45-50% by echocardiography, both in April 2009.  MIXED HYPERLIPIDEMIA Followed by primary M.D. Of note, patient is currently not on a statin, but previously was on Crestor.  Essential hypertension, benign Well-controlled on current medication regimen  TOBACCO ABUSE Complete smoking cessation strongly emphasized    Gene Adaya Garmany, PAC

## 2012-10-12 NOTE — Assessment & Plan Note (Signed)
Followed by primary M.D. Of note, patient is currently not on a statin, but previously was on Crestor.

## 2012-10-12 NOTE — Assessment & Plan Note (Signed)
Well-controlled on current medication regimen 

## 2012-10-15 ENCOUNTER — Other Ambulatory Visit (INDEPENDENT_AMBULATORY_CARE_PROVIDER_SITE_OTHER): Payer: Medicaid Other

## 2012-10-15 ENCOUNTER — Other Ambulatory Visit: Payer: Self-pay

## 2012-10-15 DIAGNOSIS — I2510 Atherosclerotic heart disease of native coronary artery without angina pectoris: Secondary | ICD-10-CM

## 2012-10-15 DIAGNOSIS — I359 Nonrheumatic aortic valve disorder, unspecified: Secondary | ICD-10-CM | POA: Diagnosis not present

## 2012-10-15 DIAGNOSIS — Z01810 Encounter for preprocedural cardiovascular examination: Secondary | ICD-10-CM

## 2012-10-16 DIAGNOSIS — I10 Essential (primary) hypertension: Secondary | ICD-10-CM | POA: Diagnosis not present

## 2012-10-16 DIAGNOSIS — E782 Mixed hyperlipidemia: Secondary | ICD-10-CM | POA: Diagnosis not present

## 2012-10-16 DIAGNOSIS — Z01810 Encounter for preprocedural cardiovascular examination: Secondary | ICD-10-CM | POA: Diagnosis not present

## 2012-10-16 DIAGNOSIS — R9439 Abnormal result of other cardiovascular function study: Secondary | ICD-10-CM | POA: Diagnosis not present

## 2012-10-16 DIAGNOSIS — F17200 Nicotine dependence, unspecified, uncomplicated: Secondary | ICD-10-CM | POA: Diagnosis not present

## 2012-10-16 DIAGNOSIS — I2510 Atherosclerotic heart disease of native coronary artery without angina pectoris: Secondary | ICD-10-CM

## 2012-10-19 ENCOUNTER — Telehealth: Payer: Self-pay | Admitting: Physician Assistant

## 2012-10-19 NOTE — Telephone Encounter (Signed)
Was told that he would be contacted with test results today

## 2012-10-20 NOTE — Telephone Encounter (Signed)
Notes Recorded by Aurora Mask, PA-C on 10/16/2012 at 8:11 AM   (ECHO) Stable LVF, as compared to prior study. To be reviewed at f/u OV  Notes Recorded by Aurora Mask, PA-C on 10/19/2012 at 12:05 PM   (STRESS TEST) EF improved to 40%, compared to prior study in 2009 (EF 33%). Evidence of prior MI, with minor peri infarct ischemia. Recent echo yielded EF 40-45%. In the absence of sxs of CP, recommend pt to proceed with surgery, as planned. He is at an acceptable risk from a cardiac standpoint. Plavix may be held for 5 days prior, and then resumed post op. Please notify pt of this result and recommendation.   Patient notified of above test results.  Results forwarded to Dr. Chelsea Primus.

## 2012-10-22 ENCOUNTER — Encounter (HOSPITAL_COMMUNITY): Payer: Self-pay | Admitting: Pharmacy Technician

## 2012-10-23 ENCOUNTER — Encounter (HOSPITAL_COMMUNITY): Payer: Self-pay

## 2012-10-23 ENCOUNTER — Encounter (HOSPITAL_COMMUNITY)
Admission: RE | Admit: 2012-10-23 | Discharge: 2012-10-23 | Disposition: A | Discharge status: Home / Self Care | Payer: Medicare Other | Source: Ambulatory Visit | Attending: General Surgery | Admitting: General Surgery

## 2012-10-23 DIAGNOSIS — E119 Type 2 diabetes mellitus without complications: Secondary | ICD-10-CM | POA: Diagnosis not present

## 2012-10-23 DIAGNOSIS — I10 Essential (primary) hypertension: Secondary | ICD-10-CM | POA: Diagnosis not present

## 2012-10-23 DIAGNOSIS — K432 Incisional hernia without obstruction or gangrene: Secondary | ICD-10-CM | POA: Diagnosis not present

## 2012-10-23 DIAGNOSIS — J449 Chronic obstructive pulmonary disease, unspecified: Secondary | ICD-10-CM | POA: Diagnosis not present

## 2012-10-23 DIAGNOSIS — Z01812 Encounter for preprocedural laboratory examination: Secondary | ICD-10-CM | POA: Diagnosis not present

## 2012-10-23 HISTORY — DX: Unspecified osteoarthritis, unspecified site: M19.90

## 2012-10-23 HISTORY — DX: Acute myocardial infarction, unspecified: I21.9

## 2012-10-23 HISTORY — DX: Anxiety disorder, unspecified: F41.9

## 2012-10-23 LAB — CBC WITH DIFFERENTIAL/PLATELET
Basophils Absolute: 0.1 10*3/uL (ref 0.0–0.1)
Basophils Relative: 1 % (ref 0–1)
Eosinophils Absolute: 0.3 10*3/uL (ref 0.0–0.7)
Eosinophils Relative: 4 % (ref 0–5)
HCT: 47 % (ref 39.0–52.0)
Hemoglobin: 16.5 g/dL (ref 13.0–17.0)
Lymphocytes Relative: 34 % (ref 12–46)
Lymphs Abs: 3.3 10*3/uL (ref 0.7–4.0)
MCH: 30.6 pg (ref 26.0–34.0)
MCHC: 35.1 g/dL (ref 30.0–36.0)
MCV: 87 fL (ref 78.0–100.0)
Monocytes Absolute: 0.6 10*3/uL (ref 0.1–1.0)
Monocytes Relative: 6 % (ref 3–12)
Neutro Abs: 5.3 10*3/uL (ref 1.7–7.7)
Neutrophils Relative %: 56 % (ref 43–77)
Platelets: 217 10*3/uL (ref 150–400)
RBC: 5.4 MIL/uL (ref 4.22–5.81)
RDW: 13 % (ref 11.5–15.5)
WBC: 9.6 10*3/uL (ref 4.0–10.5)

## 2012-10-23 LAB — SURGICAL PCR SCREEN
MRSA, PCR: NEGATIVE
Staphylococcus aureus: NEGATIVE

## 2012-10-23 LAB — BASIC METABOLIC PANEL
BUN: 16 mg/dL (ref 6–23)
CO2: 25 mEq/L (ref 19–32)
Calcium: 9.7 mg/dL (ref 8.4–10.5)
Chloride: 102 mEq/L (ref 96–112)
Creatinine, Ser: 1.01 mg/dL (ref 0.50–1.35)
GFR calc Af Amer: >90 mL/min (ref >90)
GFR calc non Af Amer: 89 mL/min — ABNORMAL LOW (ref >90)
Glucose, Bld: 87 mg/dL (ref 70–99)
Potassium: 3.9 mEq/L (ref 3.5–5.1)
Sodium: 139 mEq/L (ref 135–145)

## 2012-10-23 NOTE — H&P (Signed)
NTS SOAP Note  Vital Signs:  Vitals as of: A999333: Systolic 123456: Diastolic A999333: Heart Rate 98: Temp 99.31F: Height 42ft 11in: Weight 274Lbs 5 Ounces: Pain Level 6: BMI 38.26  BMI : 38.26 kg/m2  Subjective: This 47 Years 45 Months old Male presents for of  epigastric abdominal pain and bulge. Patient states he has had multiple abdominal hernia repairs since his CABG in 2010. Patient developed a bulge at the location of his chest tube sites. He was evaluated and noted to have an epigastric hernia. This was repaired at an outside facility by another surgeon with mesh closure from an open approach. Approximately one year later he was noted to have a recurrence at which time the same surgeon proceeded with a open repair again inserting his mother small piece of mesh. He go developed a chronic draining wound from the site and had the mesh removed. That was approximately 6 months ago. Since that time he has had a persistent bulge and worsening epigastric abdominal pain. Pain is worse with strenuous activities or bending. He is able to occasionally feel the bulge pushed back into his abdomen although this can sometimes elicit some tenderness. Over the last several months he has had increasing issues with increasing pain with oral intake. This typically happens with solid foods approximately 30 minutes to an hour after he eats. He describes it as colicky pain in the epigastric region. Following the colicky pain patient developed significant nausea and often emesis. It is noted that with the emesis the pain improves. He continues to have bowel function. No melena or hematochezia. He denies any hematemesis. No fevers or chills. He does distinguish the pain he is having into 3 separate occurrences. He describes colicky pain after eating. He describes a nearly constant dull ache in the epigastric region. He also describes an increased sharp pain with strenuous activities or excessive bending or movement.   He has  already been seen for a second opinion of the repair in [place] and was told he must lose 30-50 pounds  Before considering any surgical intervention..  Review of Symptoms:  Constitutional:unremarkable   Head:unremarkable    Eyes:unremarkable   Nose/Mouth/Throat:unremarkable     occasional chest pain    occasional shortness of breath wheezing      as per history of present illness Genitourinary:unremarkable        arthralgias Skin:unremarkable Breast:unremarkable   Hematolgic/Lymphatic:unremarkable     Allergic/Immunologic:unremarkable     Past Medical History:  Obtained     Past Medical History  Surgical History: CABG, tonsillectomy, herniorrhaphy of abdominal wall x3, cholecystectomy, carpal tunnel, cardiac catheterization Medical Problems:  asthma, history of CVA, atherosclerotic vascular disease, diabetes mellitus type 2 moderately controlled, hypertension,  renal calculi, obesity, sleep apnea, osteoarthritis, coronary artery disease Psychiatric History:  depression Allergies:  ACE inhibitors, nonsteroidal anti-inflammatory (rash) Medications:  Hyzaar, Norvasc, Ultram, Celexa, Xanax, Crestor, pro air, Nitrostat, doxycycline, Imdur, Plavix, aspirin  full dose   Social History:Obtained  Social History  Preferred Language: English Race:  White Ethnicity: Not Hispanic / Latino Age: 47 Years 45 Months Marital Status:  M Alcohol:  occasional Recreational drug(s):  none   Smoking Status: Current every day smoker reviewed on 10/08/2012 Started Date: 08/15/1981 Packs per day: 1.50 Functional Status reviewed on mm/dd/yyyy ------------------------------------------------ Bathing: Normal Cooking: Normal Dressing: Normal Driving: Normal Eating: Normal Managing Meds: Normal Oral Care: Normal Shopping: Normal Toileting: Normal Transferring: Normal Walking: Normal Cognitive Status reviewed on  mm/dd/yyyy ------------------------------------------------ Attention: Normal Decision Making: Normal Language:  Normal Memory: Normal Motor: Normal Perception: Normal Problem Solving: Normal Visual and Spatial: Normal   Family History:Obtained     Family History  Is there a family history of: noncontributory    Objective Information: General:  Well appearing, well nourished in no distress. obese Skin:     no rash or prominent lesions Head:Atraumatic; no masses; no abnormalities Eyes:  conjunctiva clear, EOM intact, PERRL Mouth:  Mucous membranes moist, no mucosal lesions. Neck:  Supple without lymphadenopathy.  Heart:  RRR, no murmur   sternal scar Lungs:    CTA bilaterally, no wheezes, rhonchi, rales.  Breathing unlabored. Abdomen:Soft, ND, no HSM, no masses.   moderate epigastric tenderness with palpation. Large palpable hernia  defect. Easily reducible.  Extremities:  No deformities, clubbing, cyanosis, or edema.       CT of the abdomen and pelvis (obtained at [facility]): I did review the images with the patient. There is evidence of a moderate sized epigastric hernia with noted small bowel present within the hernia sac. To note there is also a small left inguinal hernia with some peritoneal fat noted within it. Assessment:    Plan:  Recurrent epigastric hernia. At this time I had a  long discussion with the patient regarding his findings and symptoms. I do feel his issues with by mouth intake is likely more from obstruct the etiology possibly being contributed to by the ventral hernia. Additionally his pain symptomatology was discussed. I do feel patient is a candidate for repair although he will require cardiac clearance prior to proceeding to the operating room. Additionally patient will need to be off of his Plavix for at least 5 ideally 7 days prior to surgical intervention. Smoking cessation would also be  beneficial as of weight loss but  would not contraindicate proceeding. I discussed both open and laparoscopic approaches. Due to the location I do feel patient would benefit from a laparoscopic approach however I have discussed at length the increased pain associated with this type of repair. Additionally given the close 0.72 his chest wall I did discuss the possibility of inadvertent packing to the sternum or lower rib. Persistent pain/chronic pain following the procedure has been discussed at length the patient is aware that there were rationale for repair and hernia is to minimize the risk of incarceration or strangulation within the hernia sac. Once patient is clear from a cardiac standpoint we will proceed with scheduling. Signs and symptoms of incarceration and strangulation were discussed with the patient he is aware to proceed to the emergency department should any of these symptoms occur.  Patient Education:Alternative treatments to surgery were discussed with patient (and family).  Risks and benefits  of procedure were fully explained to the patient (and family) who gave informed consent. Patient/family questions were addressed.  Follow-up:Pending Surgery,Pending Test Results

## 2012-10-23 NOTE — Patient Instructions (Addendum)
[name]  10/23/2012   Your procedure is scheduled on:  10/28/2012  Report to [facility] at  5  AM.  Call this number if you have problems the morning of surgery: [phone]   Remember:   Do not eat food or drink liquids after midnight.   Take these medicines the morning of surgery with A SIP OF WATER: xanax,norvasc,coreg,celexa,imdur,hyzaar,ultram. Take albuterol inhaler and flonase before you come.   Do not wear jewelry, make-up or nail polish.  Do not wear lotions, powders, or perfumes.   Do not shave 48 hours prior to surgery. Men may shave face and neck.  Do not bring valuables to the hospital.  Contacts, dentures or bridgework may not be worn into surgery.  Leave suitcase in the car. After surgery it may be brought to your room.  For patients admitted to the hospital, checkout time is 11:00 AM the day of discharge.   Patients discharged the day of surgery will not be allowed to drive  home.  Name and phone number of your driver: family  Special Instructions: Shower using CHG 2 nights before surgery and the night before surgery.  If you shower the day of surgery use CHG.  Use special wash - you have one bottle of CHG for all showers.  You should use approximately 1/3 of the bottle for each shower.   Please read over the following fact sheets that you were given: Pain Booklet, Coughing and Deep Breathing, MRSA Information, Surgical Site Infection Prevention, Anesthesia Post-op Instructions and Care and Recovery After Surgery Hernia Repair with Laparoscope A hernia occurs when an internal organ pushes out through a weak spot in the belly (abdominal) wall muscles. Hernias most commonly occur in the groin and around the navel. Hernias can also occur through a cut by the surgeon (incision) after an abdominal operation. A hernia may be caused by:  Lifting heavy objects.  Prolonged coughing.  Straining to move your bowels. Hernias can often be pushed back into place  (reduced). Most hernias tend to get worse over time. Problems occur when abdominal contents get stuck in the opening and the blood supply is blocked or impaired (incarcerated hernia). Because of these risks, you require surgery to repair the hernia. Your hernia will be repaired using a laparoscope. Laparoscopic surgery is a type of minimally invasive surgery. It does not involve making a typical surgical cut (incision) in the skin. A laparoscope is a telescope-like rod and lens system. It is usually connected to a video camera and a light source so your caregiver can clearly see the operative area. The instruments are inserted through  to  inch (5 mm or 10 mm) openings in the skin at specific locations. A working and viewing space is created by blowing a small amount of carbon dioxide gas into the abdominal cavity. The abdomen is essentially blown up like a balloon (insufflated). This elevates the abdominal wall above the internal organs like a dome. The carbon dioxide gas is common to the human body and can be absorbed by tissue and removed by the respiratory system. Once the repair is completed, the small incisions will be closed with either stitches (sutures) or staples (just like a paper stapler only this staple holds the skin together). LET YOUR CAREGIVERS KNOW ABOUT:  Allergies.  Medications taken including herbs, eye drops, over the counter medications, and creams.  Use of steroids (by mouth or creams).  Previous problems with anesthetics or Novocaine.  Possibility  of pregnancy, if this applies.  History of blood clots (thrombophlebitis).  History of bleeding or blood problems.  Previous surgery.  Other health problems. BEFORE THE PROCEDURE  Laparoscopy can be done either in a hospital or out-patient clinic. You may be given a mild sedative to help you relax before the procedure. Once in the operating room, you will be given a general anesthesia to make you sleep (unless you and your  caregiver choose a different anesthetic).  AFTER THE PROCEDURE  After the procedure you will be watched in a recovery area. Depending on what type of hernia was repaired, you might be admitted to the hospital or you might go home the same day. With this procedure you may have less pain and scarring. This usually results in a quicker recovery and less risk of infection. HOME CARE INSTRUCTIONS   Bed rest is not required. You may continue your normal activities but avoid heavy lifting (more than 10 pounds) or straining.  Cough gently. If you are a smoker it is best to stop, as even the best hernia repair can break down with the continual strain of coughing.  Avoid driving until given the OK by your surgeon.  There are no dietary restrictions unless given otherwise.  TAKE ALL MEDICATIONS AS DIRECTED.  Only take over-the-counter or prescription medicines for pain, discomfort, or fever as directed by your caregiver. SEEK MEDICAL CARE IF:   There is increasing abdominal pain or pain in your incisions.  There is more bleeding from incisions, other than minimal spotting.  You feel light headed or faint.  You develop an unexplained fever, chills, and/or an oral temperature above 102 F (38.9 C).  You have redness, swelling, or increasing pain in the wound.  Pus coming from wound.  A foul smell coming from the wound or dressings. SEEK IMMEDIATE MEDICAL CARE IF:   You develop a rash.  You have difficulty breathing.  You have any allergic problems. MAKE SURE YOU:   Understand these instructions.  Will watch your condition.  Will get help right away if you are not doing well or get worse. Document Released: 07/01/2005 Document Revised: 09/23/2011 Document Reviewed: 05/31/2009 [facility] Patient Information 2013 [place]. PATIENT INSTRUCTIONS POST-ANESTHESIA  IMMEDIATELY FOLLOWING SURGERY:  Do not drive or operate machinery for the first twenty four hours after surgery.  Do  not make any important decisions for twenty four hours after surgery or while taking narcotic pain medications or sedatives.  If you develop intractable nausea and vomiting or a severe headache please notify your doctor immediately.  FOLLOW-UP:  Please make an appointment with your surgeon as instructed. You do not need to follow up with anesthesia unless specifically instructed to do so.  WOUND CARE INSTRUCTIONS (if applicable):  Keep a dry clean dressing on the anesthesia/puncture wound site if there is drainage.  Once the wound has quit draining you may leave it open to air.  Generally you should leave the bandage intact for twenty four hours unless there is drainage.  If the epidural site drains for more than 36-48 hours please call the anesthesia department.  QUESTIONS?:  Please feel free to call your physician or the hospital operator if you have any questions, and they will be happy to assist you.

## 2012-10-27 MED ORDER — CEFAZOLIN SODIUM-DEXTROSE 2-3 GM-% IV SOLR: 2.0000 g | INTRAVENOUS | Status: DC

## 2012-10-28 ENCOUNTER — Encounter (HOSPITAL_COMMUNITY): Payer: Self-pay | Admitting: Anesthesiology

## 2012-10-28 ENCOUNTER — Ambulatory Visit (HOSPITAL_COMMUNITY): Payer: Medicare Other | Admitting: Anesthesiology

## 2012-10-28 ENCOUNTER — Observation Stay (HOSPITAL_COMMUNITY)
Admission: RE | Admit: 2012-10-28 | Discharge: 2012-10-29 | Disposition: A | Discharge status: Home / Self Care | Payer: Medicare Other | Source: Ambulatory Visit | Attending: General Surgery | Admitting: General Surgery

## 2012-10-28 ENCOUNTER — Encounter (HOSPITAL_COMMUNITY): Payer: Self-pay | Admitting: *Deleted

## 2012-10-28 ENCOUNTER — Encounter (HOSPITAL_COMMUNITY)
Admission: RE | Disposition: A | Discharge status: Home / Self Care | Payer: Self-pay | Source: Ambulatory Visit | Attending: General Surgery

## 2012-10-28 DIAGNOSIS — J449 Chronic obstructive pulmonary disease, unspecified: Secondary | ICD-10-CM | POA: Diagnosis not present

## 2012-10-28 DIAGNOSIS — I10 Essential (primary) hypertension: Secondary | ICD-10-CM | POA: Insufficient documentation

## 2012-10-28 DIAGNOSIS — E119 Type 2 diabetes mellitus without complications: Secondary | ICD-10-CM | POA: Diagnosis not present

## 2012-10-28 DIAGNOSIS — K439 Ventral hernia without obstruction or gangrene: Secondary | ICD-10-CM | POA: Diagnosis not present

## 2012-10-28 DIAGNOSIS — K432 Incisional hernia without obstruction or gangrene: Secondary | ICD-10-CM | POA: Diagnosis not present

## 2012-10-28 DIAGNOSIS — J4489 Other specified chronic obstructive pulmonary disease: Secondary | ICD-10-CM | POA: Insufficient documentation

## 2012-10-28 DIAGNOSIS — Z01812 Encounter for preprocedural laboratory examination: Secondary | ICD-10-CM | POA: Diagnosis not present

## 2012-10-28 HISTORY — PX: VENTRAL HERNIA REPAIR: SHX424

## 2012-10-28 LAB — GLUCOSE, CAPILLARY
Glucose-Capillary: 104 mg/dL — ABNORMAL HIGH (ref 70–99)
Glucose-Capillary: 117 mg/dL — ABNORMAL HIGH (ref 70–99)

## 2012-10-28 SURGERY — REPAIR, HERNIA, VENTRAL, LAPAROSCOPIC
Anesthesia: General | Site: Abdomen | Wound class: Clean

## 2012-10-28 MED ORDER — ISOSORBIDE MONONITRATE ER 60 MG PO TB24
60.0000 mg | ORAL_TABLET | Freq: Two times a day (BID) | ORAL | Status: DC
  Administered 2012-10-28 – 2012-10-29 (×3): 60 mg via ORAL
  Filled 2012-10-28 (×3): qty 1

## 2012-10-28 MED ORDER — ROCURONIUM BROMIDE 50 MG/5ML IV SOLN
INTRAVENOUS | Status: AC
  Filled 2012-10-28: qty 1

## 2012-10-28 MED ORDER — IPRATROPIUM BROMIDE 0.02 % IN SOLN
0.5000 mg | RESPIRATORY_TRACT | Status: DC | PRN
  Administered 2012-10-28 – 2012-10-29 (×2): 0.5 mg via RESPIRATORY_TRACT
  Filled 2012-10-28 (×2): qty 2.5

## 2012-10-28 MED ORDER — HYDROMORPHONE HCL PF 1 MG/ML IJ SOLN
0.5000 mg | INTRAMUSCULAR | Status: DC | PRN
  Administered 2012-10-28 – 2012-10-29 (×5): 1 mg via INTRAVENOUS
  Filled 2012-10-28 (×5): qty 1

## 2012-10-28 MED ORDER — CEFAZOLIN SODIUM-DEXTROSE 2-3 GM-% IV SOLR: 2.0000 g | Freq: Once | INTRAVENOUS | Status: DC

## 2012-10-28 MED ORDER — NEOSTIGMINE METHYLSULFATE 1 MG/ML IJ SOLN
INTRAMUSCULAR | Status: AC
  Filled 2012-10-28: qty 1

## 2012-10-28 MED ORDER — FENTANYL CITRATE 0.05 MG/ML IJ SOLN
INTRAMUSCULAR | Status: AC
  Filled 2012-10-28: qty 2

## 2012-10-28 MED ORDER — ENOXAPARIN SODIUM 30 MG/0.3ML ~~LOC~~ SOLN
30.0000 mg | SUBCUTANEOUS | Status: DC
  Administered 2012-10-29: 30 mg via SUBCUTANEOUS
  Filled 2012-10-28: qty 0.3

## 2012-10-28 MED ORDER — PROPOFOL 10 MG/ML IV EMUL
INTRAVENOUS | Status: AC
  Filled 2012-10-28: qty 20

## 2012-10-28 MED ORDER — CITALOPRAM HYDROBROMIDE 20 MG PO TABS
20.0000 mg | ORAL_TABLET | Freq: Every day | ORAL | Status: DC
  Administered 2012-10-28 – 2012-10-29 (×2): 20 mg via ORAL
  Filled 2012-10-28 (×2): qty 1

## 2012-10-28 MED ORDER — DIAZEPAM 5 MG PO TABS
10.0000 mg | ORAL_TABLET | Freq: Three times a day (TID) | ORAL | Status: DC | PRN
  Administered 2012-10-28: 10 mg via ORAL

## 2012-10-28 MED ORDER — GLYCOPYRROLATE 0.2 MG/ML IJ SOLN
INTRAMUSCULAR | Status: AC
  Filled 2012-10-28: qty 2

## 2012-10-28 MED ORDER — SUCCINYLCHOLINE CHLORIDE 20 MG/ML IJ SOLN
INTRAMUSCULAR | Status: DC | PRN
  Administered 2012-10-28: 140 mg via INTRAVENOUS

## 2012-10-28 MED ORDER — LACTATED RINGERS IV SOLN
INTRAVENOUS | Status: DC
  Administered 2012-10-28: 10:00:00 via INTRAVENOUS
  Administered 2012-10-28: 1000 mL via INTRAVENOUS

## 2012-10-28 MED ORDER — HYDROMORPHONE HCL PF 1 MG/ML IJ SOLN
2.0000 mg | INTRAMUSCULAR | Status: AC
  Administered 2012-10-28: 0.5 mg via INTRAVENOUS

## 2012-10-28 MED ORDER — CEFAZOLIN SODIUM-DEXTROSE 2-3 GM-% IV SOLR
INTRAVENOUS | Status: AC
  Filled 2012-10-28: qty 50

## 2012-10-28 MED ORDER — CEFAZOLIN SODIUM 1-5 GM-% IV SOLN
INTRAVENOUS | Status: AC
  Filled 2012-10-28: qty 50

## 2012-10-28 MED ORDER — HYDROCHLOROTHIAZIDE 25 MG PO TABS
25.0000 mg | ORAL_TABLET | Freq: Every day | ORAL | Status: DC
  Administered 2012-10-29: 25 mg via ORAL
  Filled 2012-10-28: qty 1

## 2012-10-28 MED ORDER — ENOXAPARIN SODIUM 40 MG/0.4ML ~~LOC~~ SOLN
40.0000 mg | Freq: Once | SUBCUTANEOUS | Status: AC
  Administered 2012-10-28: 40 mg via SUBCUTANEOUS

## 2012-10-28 MED ORDER — ENOXAPARIN SODIUM 40 MG/0.4ML ~~LOC~~ SOLN
SUBCUTANEOUS | Status: AC
  Filled 2012-10-28: qty 0.4

## 2012-10-28 MED ORDER — LIDOCAINE HCL (PF) 1 % IJ SOLN
INTRAMUSCULAR | Status: AC
  Filled 2012-10-28: qty 5

## 2012-10-28 MED ORDER — ALBUTEROL SULFATE (5 MG/ML) 0.5% IN NEBU
2.5000 mg | INHALATION_SOLUTION | RESPIRATORY_TRACT | Status: DC | PRN
Start: 2012-10-28 — End: 2012-10-29
  Administered 2012-10-28 – 2012-10-29 (×2): 2.5 mg via RESPIRATORY_TRACT
  Filled 2012-10-28 (×2): qty 0.5

## 2012-10-28 MED ORDER — GLYCOPYRROLATE 0.2 MG/ML IJ SOLN
INTRAMUSCULAR | Status: DC | PRN
  Administered 2012-10-28: 0.4 mg via INTRAVENOUS

## 2012-10-28 MED ORDER — MIDAZOLAM HCL 2 MG/2ML IJ SOLN
INTRAMUSCULAR | Status: AC
  Filled 2012-10-28: qty 2

## 2012-10-28 MED ORDER — SUCCINYLCHOLINE CHLORIDE 20 MG/ML IJ SOLN
INTRAMUSCULAR | Status: AC
  Filled 2012-10-28: qty 1

## 2012-10-28 MED ORDER — NEOSTIGMINE METHYLSULFATE 1 MG/ML IJ SOLN
INTRAMUSCULAR | Status: DC | PRN
  Administered 2012-10-28: 2 mg via INTRAVENOUS

## 2012-10-28 MED ORDER — CLOPIDOGREL BISULFATE 75 MG PO TABS
75.0000 mg | ORAL_TABLET | Freq: Every day | ORAL | Status: DC
  Administered 2012-10-28 – 2012-10-29 (×2): 75 mg via ORAL
  Filled 2012-10-28 (×2): qty 1

## 2012-10-28 MED ORDER — CEFAZOLIN SODIUM 1-5 GM-% IV SOLN: 1.0000 g | Freq: Once | INTRAVENOUS | Status: DC

## 2012-10-28 MED ORDER — DIAZEPAM 5 MG PO TABS
ORAL_TABLET | ORAL | Status: AC
  Filled 2012-10-28: qty 2

## 2012-10-28 MED ORDER — TRAMADOL HCL 50 MG PO TABS: 50.0000 mg | ORAL_TABLET | Freq: Four times a day (QID) | ORAL | Status: DC | PRN

## 2012-10-28 MED ORDER — HYDROMORPHONE HCL PF 1 MG/ML IJ SOLN
0.5000 mg | INTRAMUSCULAR | Status: DC | PRN
  Administered 2012-10-28: 0.5 mg via INTRAVENOUS
  Filled 2012-10-28: qty 1

## 2012-10-28 MED ORDER — ONDANSETRON HCL 4 MG/2ML IJ SOLN
INTRAMUSCULAR | Status: AC
  Filled 2012-10-28: qty 2

## 2012-10-28 MED ORDER — MIDAZOLAM HCL 2 MG/2ML IJ SOLN
1.0000 mg | INTRAMUSCULAR | Status: DC | PRN
  Administered 2012-10-28 (×2): 2 mg via INTRAVENOUS

## 2012-10-28 MED ORDER — ROCURONIUM BROMIDE 100 MG/10ML IV SOLN
INTRAVENOUS | Status: DC | PRN
  Administered 2012-10-28: 35 mg via INTRAVENOUS
  Administered 2012-10-28: 10 mg via INTRAVENOUS
  Administered 2012-10-28: 5 mg via INTRAVENOUS

## 2012-10-28 MED ORDER — OXYCODONE-ACETAMINOPHEN 5-325 MG PO TABS
ORAL_TABLET | ORAL | Status: AC
  Filled 2012-10-28: qty 2

## 2012-10-28 MED ORDER — ALBUTEROL SULFATE HFA 108 (90 BASE) MCG/ACT IN AERS
2.0000 | INHALATION_SPRAY | Freq: Four times a day (QID) | RESPIRATORY_TRACT | Status: DC | PRN
  Administered 2012-10-28 (×2): 2 via RESPIRATORY_TRACT
  Filled 2012-10-28: qty 6.7

## 2012-10-28 MED ORDER — LIDOCAINE HCL (CARDIAC) 10 MG/ML IV SOLN
INTRAVENOUS | Status: DC | PRN
  Administered 2012-10-28: 20 mg via INTRAVENOUS

## 2012-10-28 MED ORDER — ONDANSETRON HCL 4 MG/2ML IJ SOLN
4.0000 mg | Freq: Once | INTRAMUSCULAR | Status: AC
  Administered 2012-10-28: 4 mg via INTRAVENOUS

## 2012-10-28 MED ORDER — FENTANYL CITRATE 0.05 MG/ML IJ SOLN
25.0000 ug | INTRAMUSCULAR | Status: DC | PRN
  Administered 2012-10-28 (×4): 50 ug via INTRAVENOUS

## 2012-10-28 MED ORDER — BUPIVACAINE HCL (PF) 0.5 % IJ SOLN
INTRAMUSCULAR | Status: DC | PRN
  Administered 2012-10-28: 10 mL

## 2012-10-28 MED ORDER — HYDROMORPHONE HCL PF 1 MG/ML IJ SOLN
INTRAMUSCULAR | Status: AC
  Filled 2012-10-28: qty 1

## 2012-10-28 MED ORDER — NITROGLYCERIN 0.4 MG SL SUBL: 0.4000 mg | SUBLINGUAL_TABLET | SUBLINGUAL | Status: DC | PRN

## 2012-10-28 MED ORDER — AMLODIPINE BESYLATE 5 MG PO TABS
10.0000 mg | ORAL_TABLET | Freq: Every day | ORAL | Status: DC
  Administered 2012-10-28 – 2012-10-29 (×2): 10 mg via ORAL
  Filled 2012-10-28 (×2): qty 2

## 2012-10-28 MED ORDER — SUFENTANIL CITRATE 50 MCG/ML IV SOLN
INTRAVENOUS | Status: DC | PRN
  Administered 2012-10-28: 5 ug via INTRAVENOUS
  Administered 2012-10-28: 15 ug via INTRAVENOUS
  Administered 2012-10-28 (×3): 10 ug via INTRAVENOUS

## 2012-10-28 MED ORDER — OXYCODONE-ACETAMINOPHEN 5-325 MG PO TABS
1.0000 | ORAL_TABLET | ORAL | Status: DC | PRN
  Administered 2012-10-28 (×2): 2 via ORAL
  Filled 2012-10-28: qty 2

## 2012-10-28 MED ORDER — CHLORHEXIDINE GLUCONATE 4 % EX LIQD: 1.0000 "application " | Freq: Once | CUTANEOUS | Status: DC

## 2012-10-28 MED ORDER — ONDANSETRON HCL 4 MG/2ML IJ SOLN
4.0000 mg | Freq: Once | INTRAMUSCULAR | Status: AC | PRN
  Administered 2012-10-28: 4 mg via INTRAVENOUS

## 2012-10-28 MED ORDER — 0.9 % SODIUM CHLORIDE (POUR BTL) OPTIME
TOPICAL | Status: DC | PRN
  Administered 2012-10-28: 1000 mL

## 2012-10-28 MED ORDER — GLYCOPYRROLATE 0.2 MG/ML IJ SOLN
INTRAMUSCULAR | Status: AC
  Filled 2012-10-28: qty 1

## 2012-10-28 MED ORDER — PROPOFOL 10 MG/ML IV BOLUS
INTRAVENOUS | Status: DC | PRN
  Administered 2012-10-28: 20 mg via INTRAVENOUS
  Administered 2012-10-28: 150 mg via INTRAVENOUS

## 2012-10-28 MED ORDER — CARVEDILOL 12.5 MG PO TABS
25.0000 mg | ORAL_TABLET | Freq: Every day | ORAL | Status: DC
  Administered 2012-10-28 – 2012-10-29 (×2): 25 mg via ORAL
  Filled 2012-10-28 (×2): qty 2

## 2012-10-28 MED ORDER — SUFENTANIL CITRATE 50 MCG/ML IV SOLN
INTRAVENOUS | Status: AC
  Filled 2012-10-28: qty 1

## 2012-10-28 MED ORDER — GLYCOPYRROLATE 0.2 MG/ML IJ SOLN
0.2000 mg | Freq: Once | INTRAMUSCULAR | Status: AC
  Administered 2012-10-28: 0.2 mg via INTRAVENOUS

## 2012-10-28 MED ORDER — ATORVASTATIN CALCIUM 40 MG PO TABS
40.0000 mg | ORAL_TABLET | Freq: Every day | ORAL | Status: DC
  Administered 2012-10-28 – 2012-10-29 (×2): 40 mg via ORAL
  Filled 2012-10-28 (×2): qty 1

## 2012-10-28 MED ORDER — DEXTROSE 5 % IV SOLN
3.0000 g | INTRAVENOUS | Status: DC
  Administered 2012-10-28: 1 g via INTRAVENOUS
  Administered 2012-10-28: 2 g via INTRAVENOUS

## 2012-10-28 MED ORDER — LOSARTAN POTASSIUM-HCTZ 100-25 MG PO TABS: 1.0000 | ORAL_TABLET | Freq: Every day | ORAL | Status: DC

## 2012-10-28 MED ORDER — LOSARTAN POTASSIUM 50 MG PO TABS
100.0000 mg | ORAL_TABLET | Freq: Every day | ORAL | Status: DC
  Administered 2012-10-29: 100 mg via ORAL
  Filled 2012-10-28: qty 2

## 2012-10-28 SURGICAL SUPPLY — 46 items
APL SKNCLS STERI-STRIP NONHPOA (GAUZE/BANDAGES/DRESSINGS) ×1
BAG HAMPER (MISCELLANEOUS) ×2 IMPLANT
BENZOIN TINCTURE PRP APPL 2/3 (GAUZE/BANDAGES/DRESSINGS) ×2 IMPLANT
CLOTH BEACON ORANGE TIMEOUT ST (SAFETY) ×2 IMPLANT
COVER LIGHT HANDLE STERIS (MISCELLANEOUS) ×4 IMPLANT
DECANTER SPIKE VIAL GLASS SM (MISCELLANEOUS) ×2 IMPLANT
DEVICE SECURE STRAP 25 ABSORB (INSTRUMENTS) ×3 IMPLANT
DEVICE TROCAR PUNCTURE CLOSURE (ENDOMECHANICALS) IMPLANT
DURAPREP 26ML APPLICATOR (WOUND CARE) ×2 IMPLANT
ELECT REM PT RETURN 9FT ADLT (ELECTROSURGICAL) ×2
ELECTRODE REM PT RTRN 9FT ADLT (ELECTROSURGICAL) ×1 IMPLANT
FILTER SMOKE EVAC LAPAROSHD (FILTER) ×2 IMPLANT
GLOVE BIOGEL PI IND STRL 7.0 (GLOVE) IMPLANT
GLOVE BIOGEL PI IND STRL 7.5 (GLOVE) ×1 IMPLANT
GLOVE BIOGEL PI INDICATOR 7.0 (GLOVE) ×3
GLOVE BIOGEL PI INDICATOR 7.5 (GLOVE) ×1
GLOVE ECLIPSE 7.0 STRL STRAW (GLOVE) ×3 IMPLANT
GLOVE SS BIOGEL STRL SZ 6.5 (GLOVE) IMPLANT
GLOVE SUPERSENSE BIOGEL SZ 6.5 (GLOVE) ×1
GOWN STRL REIN XL XLG (GOWN DISPOSABLE) ×6 IMPLANT
INST SET LAPROSCOPIC AP (KITS) ×2 IMPLANT
IV NS IRRIG 3000ML ARTHROMATIC (IV SOLUTION) ×2 IMPLANT
KIT ROOM TURNOVER APOR (KITS) ×2 IMPLANT
MANIFOLD NEPTUNE II (INSTRUMENTS) ×2 IMPLANT
MESH PHYSIO OVAL 15X20CM (Mesh General) ×1 IMPLANT
NDL INSUFFLATION 14GA 120MM (NEEDLE) ×1 IMPLANT
NEEDLE INSUFFLATION 14GA 120MM (NEEDLE) ×2 IMPLANT
PACK LAP CHOLE LZT030E (CUSTOM PROCEDURE TRAY) ×2 IMPLANT
PAD ARMBOARD 7.5X6 YLW CONV (MISCELLANEOUS) ×2 IMPLANT
SEALER TISSUE G2 CVD JAW 35 (ENDOMECHANICALS) ×1 IMPLANT
SEALER TISSUE G2 CVD JAW 45CM (ENDOMECHANICALS) ×1
SET BASIN LINEN APH (SET/KITS/TRAYS/PACK) ×2 IMPLANT
SET TUBE IRRIG SUCTION NO TIP (IRRIGATION / IRRIGATOR) IMPLANT
SLEEVE Z-THREAD 5X100MM (TROCAR) ×2 IMPLANT
STRIP CLOSURE SKIN 1/2X4 (GAUZE/BANDAGES/DRESSINGS) ×2 IMPLANT
SUT MNCRL AB 4-0 PS2 18 (SUTURE) ×2 IMPLANT
SUT NOVA NAB GS-22 2 2-0 T-19 (SUTURE) ×2 IMPLANT
SUT VIC AB 2-0 CT1 27 (SUTURE) ×2
SUT VIC AB 2-0 CT1 TAPERPNT 27 (SUTURE) ×1 IMPLANT
TOWEL OR 17X26 4PK STRL BLUE (TOWEL DISPOSABLE) ×2 IMPLANT
TRAY FOLEY CATH 14FR (SET/KITS/TRAYS/PACK) ×2 IMPLANT
TROCAR Z-THRD FIOS HNDL 11X100 (TROCAR) ×2 IMPLANT
TROCAR Z-THREAD FIOS 5X100MM (TROCAR) ×2 IMPLANT
TROCAR Z-THREAD SLEEVE 11X100 (TROCAR) ×1 IMPLANT
TUBING HI FLO HEAT INSUFFLATOR (IRRIGATION / IRRIGATOR) ×2 IMPLANT
WARMER LAPAROSCOPE (MISCELLANEOUS) ×2 IMPLANT

## 2012-10-28 NOTE — Progress Notes (Signed)
Patient needing VTE, Dr. Geroge Baseman notified. New orders for Lovenox 30mg  to start in AM, Patient had dose today in pre-op.

## 2012-10-28 NOTE — Anesthesia Procedure Notes (Signed)
Procedure Name: Intubation Date/Time: 10/28/2012 8:29 AM Performed by: Vista Deck Pre-anesthesia Checklist: Patient identified, Patient being monitored, Timeout performed, Emergency Drugs available and Suction available Patient Re-evaluated:Patient Re-evaluated prior to inductionOxygen Delivery Method: Circle System Utilized Preoxygenation: Pre-oxygenation with 100% oxygen Intubation Type: IV induction, Rapid sequence and Cricoid Pressure applied Ventilation: Mask ventilation without difficulty Laryngoscope Size: Miller and 2 Grade View: Grade I Tube type: Oral Tube size: 7.0 mm Number of attempts: 1 Airway Equipment and Method: stylet Placement Confirmation: ETT inserted through vocal cords under direct vision,  positive ETCO2 and breath sounds checked- equal and bilateral Secured at: 21 cm Tube secured with: Tape Dental Injury: Teeth and Oropharynx as per pre-operative assessment

## 2012-10-28 NOTE — Anesthesia Postprocedure Evaluation (Signed)
  Anesthesia Post-op Note  Patient: [name]  Procedure(s) Performed: Procedure(s): LAPAROSCOPIC VENTRAL HERNIA (N/A)  Patient Location: PACU  Anesthesia Type:General  Level of Consciousness: sedated and patient cooperative  Airway and Oxygen Therapy: Patient Spontanous Breathing and non-rebreather face mask  Post-op Pain: mild  Post-op Assessment: Post-op Vital signs reviewed, Patient's Cardiovascular Status Stable, Respiratory Function Stable, Patent Airway and No signs of Nausea or vomiting  Post-op Vital Signs: Reviewed and stable  Complications: No apparent anesthesia complications

## 2012-10-28 NOTE — Interval H&P Note (Signed)
History and Physical Interval Note:  10/28/2012 8:10 AM  [name]  has presented today for surgery, with the diagnosis of ventral hernia  The various methods of treatment have been discussed with the patient and family. After consideration of risks, benefits and other options for treatment, the patient has consented to  Procedure(s): LAPAROSCOPIC VENTRAL HERNIA (N/A) as a surgical intervention .  The patient's history has been reviewed, patient examined, no change in status, stable for surgery.  I have reviewed the patient's chart and labs.  Questions were answered to the patient's satisfaction.     Kearston Putman C

## 2012-10-28 NOTE — Anesthesia Preprocedure Evaluation (Signed)
Anesthesia Evaluation  Patient identified by MRN, date of birth, ID band Patient awake    Reviewed: Allergy & Precautions, H&P , NPO status , Patient's Chart, lab work & pertinent test results  History of Anesthesia Complications Negative for: history of anesthetic complications  Airway Mallampati: I TM Distance: >3 FB Neck ROM: Full    Dental  (+) Edentulous Upper and Edentulous Lower   Pulmonary sleep apnea and Continuous Positive Airway Pressure Ventilation , COPD COPD inhaler,  breath sounds clear to auscultation        Cardiovascular hypertension, Pt. 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 Rhythm:Regular Rate:Normal     Neuro/Psych PSYCHIATRIC DISORDERS Anxiety Depression    GI/Hepatic   Endo/Other  diabetes, Well Controlled, Type 2Morbid obesity  Renal/GU      Musculoskeletal   Abdominal   Peds  Hematology   Anesthesia Other Findings   Reproductive/Obstetrics                           Anesthesia Physical Anesthesia Plan  ASA: III  Anesthesia Plan: General   Post-op Pain Management:    Induction: Intravenous, Rapid sequence and Cricoid pressure planned  Airway Management Planned: Oral ETT  Additional Equipment:   Intra-op Plan:   Post-operative Plan: Extubation in OR  Informed Consent: I have reviewed the patients History and Physical, chart, labs and discussed the procedure including the risks, benefits and alternatives for the proposed anesthesia with the patient or authorized representative who has indicated his/her understanding and acceptance.     Plan Discussed with:   Anesthesia Plan Comments:         Anesthesia Quick Evaluation

## 2012-10-28 NOTE — Op Note (Signed)
Patient:  [name]  DOB:  1966/06/20  MRN:  [id number]   Preop Diagnosis:  Recurrent ventral hernia  Postop Diagnosis:  The same  Procedure:  Laparoscopic ventral hernia repair with 15 x 20 cm mesh  Surgeon:  Dr. Chelsea Primus  Anes:  General endotracheal, 0.5% Sensorcaine plain for local  Indications:  Patient is a 47 year old male presented to my office with a history of recur and ventral hernia. He had 2 previous ventral hernia repairs on the same area. He noted a bulge with increasing discomfort. Evaluation was consistent with a recurrent ventral hernia. Risks benefits alternatives of open versus a laparoscopic approach were discussed. Risk including but not limited to risk of bleeding, infection, recurrence were discussed. Additionally we discussed the increased pain typically associated with a laparoscopic approach and the possibility of some chronic pain. He has questions and concerns are addressed the patient as consented for the planned procedure.  Procedure note:  Patient is taken to the operating room was placed in a supine position the or table time the general anesthesia is administered. Once patient was asleep he was endotracheally intubated by the nurse anesthetist.  Foley catheter is placed by the operating room staff under standard sterile fashion. At this point his abdomen is prepped with DuraPrep solution and draped in standard fashion.  Time out was performed. A stab incision was created supraumbilically with 11 blade scalpel additional dissection down to subcuticular tissue carried out using a Coker clamp was utilized to grasp the anterior normal fascia and lift this anteriorly. A Veress needle is inserted saline drop test and she was confirm intraperitoneal placement. Pneumoperitoneum was initiated and once sufficient pneumoperitoneum was obtained an 11 mm insert overlap scope allowing visualization the trocar entering into the peritoneal cavity. At this point the  fascial defect is easily identified. The falciform ligament was divided after placement of a 5 mm trocar in the left lateral normal wall and her visualization. The falciform ligament was divided to allow for adequate area for the mesh placement. At this time the defect is measured by palpation of the anterior abdominal wall. A marking pen is utilized to Chelsea the planned sites of the pexing sutures. Pneumoperitoneum was briefly evacuated to allow more adequate measurements and the measurements approached 15 x 20 cm. The appropriately sized mesh was brought to the field. A 20 Novafil sutures were utilized for the pexing sutures at the 4 quadrants of the mesh. The mesh was unrolled in place to the 11 mm trocar site. Once intra-abdominal the mesh was unrolled. He was positioned appropriately and orientated. Using stab incisions the previously marked sites a Endo Close suture passing device was utilized to retrieve the sutures at the 4 quadrants. With the pexing sutures in place the mesh was inspected. As quite pleased with the overlay of the fascial defect. The sutures were secured. A versus strap Vicryl and stapler device was then brought to the field and was utilized to pexed the mesh circumferentially. I did minimize the number of contacts along the superior aspect of the mesh due to the close approximation of the costal margin. After circumferentially pexing the mesh as quite pleased with the appearance. The pneumoperitoneum was evacuated. The trochars were removed. A 2-0 Vicryl suture was utilized to reapproximate the fascia at the umbilical trocar site. The skin edges at the 3 trocar sites were reapproximated using a 4-0 Monocryl in a running subcuticular suture. The skin was washed dried moist dry towel. Benzoin is applied around  incision. Half-inch Steri-Strips are placed over all incisions. The drapes removed. The patient was allowed to come out of general anesthetic and was transferred to the PACU in stable  condition. At the conclusion of procedure all instrument, sponge, needle counts are correct. Patient tolerated procedure extremely well.  Complications:  None apparent  EBL:  Minimal  Specimen:  None

## 2012-10-28 NOTE — Transfer of Care (Signed)
Immediate Anesthesia Transfer of Care Note  Patient: [name]  Procedure(s) Performed: Procedure(s) (LRB): LAPAROSCOPIC VENTRAL HERNIA (N/A)  Patient Location: PACU  Anesthesia Type: General  Level of Consciousness: awake  Airway & Oxygen Therapy: Patient Spontanous Breathing and non-rebreather face mask  Post-op Assessment: Report given to PACU RN, Post -op Vital signs reviewed and stable and Patient moving all extremities  Post vital signs: Reviewed and stable  Complications: No apparent anesthesia complications

## 2012-10-29 ENCOUNTER — Encounter (HOSPITAL_COMMUNITY): Payer: Self-pay | Admitting: General Surgery

## 2012-10-29 DIAGNOSIS — E119 Type 2 diabetes mellitus without complications: Secondary | ICD-10-CM | POA: Diagnosis not present

## 2012-10-29 DIAGNOSIS — K432 Incisional hernia without obstruction or gangrene: Secondary | ICD-10-CM | POA: Diagnosis not present

## 2012-10-29 DIAGNOSIS — J449 Chronic obstructive pulmonary disease, unspecified: Secondary | ICD-10-CM | POA: Diagnosis not present

## 2012-10-29 DIAGNOSIS — I10 Essential (primary) hypertension: Secondary | ICD-10-CM | POA: Diagnosis not present

## 2012-10-29 DIAGNOSIS — Z01812 Encounter for preprocedural laboratory examination: Secondary | ICD-10-CM | POA: Diagnosis not present

## 2012-10-29 MED ORDER — DIAZEPAM 10 MG PO TABS: 10.0000 mg | ORAL_TABLET | Freq: Three times a day (TID) | ORAL | Status: DC | PRN

## 2012-10-29 MED ORDER — HYDROCODONE-ACETAMINOPHEN 7.5-325 MG PO TABS: 1.0000 | ORAL_TABLET | ORAL | Status: DC | PRN

## 2012-10-29 MED ORDER — HYDROCODONE-ACETAMINOPHEN 7.5-325 MG PO TABS
1.0000 | ORAL_TABLET | ORAL | Status: DC | PRN
  Administered 2012-10-29: 1 via ORAL
  Filled 2012-10-29: qty 1

## 2012-10-29 NOTE — Progress Notes (Signed)
UR Chart Review Completed  

## 2012-10-29 NOTE — Progress Notes (Signed)
Patient with orders to be discharge home. Discharge instructions given, patient verbalized understanding. Patient in stable condition upon discharge. Patient left with spouse in private vehicle.

## 2012-10-29 NOTE — Anesthesia Postprocedure Evaluation (Signed)
Anesthesia Post Note  Patient: [name]  Procedure(s) Performed: Procedure(s) (LRB): LAPAROSCOPIC VENTRAL HERNIA (N/A)  Anesthesia type: General  Patient location: 327  Post pain: Pain level controlled  Post assessment: Post-op Vital signs reviewed, Patient's Cardiovascular Status Stable, Respiratory Function Stable, Patent Airway, No signs of Nausea or vomiting and Pain level controlled  Last Vitals:  Filed Vitals:   10/29/12 0554  BP: 125/56  Pulse: 97  Temp: 36.6 C  Resp: 20    Post vital signs: Reviewed and stable  Level of consciousness: awake and alert   Complications: No apparent anesthesia complications

## 2012-11-06 ENCOUNTER — Ambulatory Visit: Payer: Medicare Other | Admitting: Cardiology

## 2012-11-09 NOTE — Discharge Summary (Signed)
Physician Discharge Summary  Patient ID: [name] MRN: [id number] DOB/AGE: 45/11/1965 45 y.o.  Admit date: 10/28/2012 Discharge date: 10/29/2012  Admission Diagnoses: Recurrent ventral hernia  Discharge Diagnoses: The same Active Problems:   * No active hospital problems. *   Discharged Condition: stable  Hospital Course: Patient presented to [facility] short stay for planned laparoscopic ventral hernia repair of a recurrent ventral hernia. He underwent the uneventful laparoscopic ventral hernia repair on 10/28/2012. He tolerated a short period in the recovery area and was then transferred up to a medical surgical floor to continue to adjust his pain regimen. He was watched overnight. His pain continued to be improved and on 10/29/2012 patient was tolerating a diet comment pain was controlled oral pain medication. Plans are made for discharge.  Consults: None  Significant Diagnostic Studies: labs: Morning labs  Treatments: surgery: Laparoscopic ventral hernia repair  Discharge Exam: Blood pressure 143/60, pulse 99, temperature 99.5 F (37.5 C), temperature source Oral, resp. rate 20, height 5\' 11"  (1.803 m), weight 126.1 kg (278 lb), SpO2 91.00%. General appearance: alert, no distress and Still somewhat uncomfortable in appearance Resp: clear to auscultation bilaterally Cardio: regular rate and rhythm GI: Positive bowel sounds, soft, moderate expected postoperative tenderness. Incisions are clean dry and intact. No diffuse peritoneal signs.  Disposition: 01-Home or Self Care  Discharge Orders   Future Orders Complete By Expires     Call MD for:  persistant nausea and vomiting  As directed     Call MD for:  redness, tenderness, or signs of infection (pain, swelling, redness, odor or green/yellow discharge around incision site)  As directed     Call MD for:  severe uncontrolled pain  As directed     Call MD for:  temperature >100.4  As directed     Diet - low sodium  heart healthy  As directed     Discharge instructions  As directed     Comments:      Increase activity as tolerated. May place ice pack for comfort.  Alternate an anti-inflammatory such as ibuprofen (Motrin, Advil) 400-600mg  every 6 hours with the prescribed pain medication.   Do not take any additional acetaminophen as there is Tylenol in the pain medication.    Discharge wound care:  As directed     Comments:      Clean surgical sites with soap and water.  May shower the morning after surgery unless instructed by Dr. Geroge Baseman otherwise.  No soaking for 2-3 weeks.    If adhesive strips are in place, they may be removed in 1-2 weeks while in the shower.    Driving Restrictions  As directed     Comments:      No driving while on pain medications.    Increase activity slowly  As directed     Lifting restrictions  As directed     Comments:      No lifting over 20lbs for 4-5 weeks post-op.        Medication List    TAKE these medications       albuterol 108 (90 BASE) MCG/ACT inhaler  Commonly known as:  PROVENTIL HFA;VENTOLIN HFA  Inhale 2 puffs into the lungs every 6 (six) hours as needed for wheezing or shortness of breath.     ALPRAZolam 1 MG tablet  Commonly known as:  XANAX  Take 1 mg by mouth 4 (four) times daily.     amLODipine 10 MG tablet  Commonly known as:  NORVASC  Take 10 mg by mouth daily.     aspirin EC 325 MG tablet  Take 325 mg by mouth daily.     carvedilol 25 MG tablet  Commonly known as:  COREG  Take 25 mg by mouth daily.     citalopram 40 MG tablet  Commonly known as:  CELEXA  Take 20 mg by mouth daily.     clopidogrel 75 MG tablet  Commonly known as:  PLAVIX  Take 75 mg by mouth daily.     diazepam 10 MG tablet  Commonly known as:  VALIUM  Take 1 tablet (10 mg total) by mouth every 8 (eight) hours as needed.     fluticasone 50 MCG/ACT nasal spray  Commonly known as:  FLONASE  Place 2 sprays into the nose daily as needed. For dry nasal  passages     HYDROcodone-acetaminophen 7.5-325 MG per tablet  Commonly known as:  NORCO  Take 1-2 tablets by mouth every 4 (four) hours as needed.     isosorbide mononitrate 60 MG 24 hr tablet  Commonly known as:  IMDUR  Take 60 mg by mouth 2 (two) times daily.     losartan-hydrochlorothiazide 100-25 MG per tablet  Commonly known as:  HYZAAR  Take 1 tablet by mouth daily.     metroNIDAZOLE 0.75 % cream  Commonly known as:  METROCREAM  Apply 1 application topically 2 (two) times daily.     nitroGLYCERIN 0.4 MG SL tablet  Commonly known as:  NITROSTAT  Place 0.4 mg under the tongue every 5 (five) minutes as needed. For chest pain     rosuvastatin 40 MG tablet  Commonly known as:  CRESTOR  Take 40 mg by mouth daily.     traMADol 50 MG tablet  Commonly known as:  ULTRAM  Take 50 mg by mouth every 6 (six) hours as needed. For pain           Follow-up Information   Follow up with Donato Heinz, MD In 2 weeks.   Contact information:   [place] [id number] [phone]       Signed: Donato Heinz 11/09/2012, 2:52 PM

## 2012-11-24 DIAGNOSIS — E119 Type 2 diabetes mellitus without complications: Secondary | ICD-10-CM | POA: Diagnosis not present

## 2012-11-24 DIAGNOSIS — IMO0001 Reserved for inherently not codable concepts without codable children: Secondary | ICD-10-CM | POA: Diagnosis not present

## 2012-11-24 DIAGNOSIS — I10 Essential (primary) hypertension: Secondary | ICD-10-CM | POA: Diagnosis not present

## 2012-11-24 DIAGNOSIS — F17200 Nicotine dependence, unspecified, uncomplicated: Secondary | ICD-10-CM | POA: Diagnosis not present

## 2012-11-24 DIAGNOSIS — J309 Allergic rhinitis, unspecified: Secondary | ICD-10-CM | POA: Diagnosis not present

## 2012-11-24 DIAGNOSIS — IMO0002 Reserved for concepts with insufficient information to code with codable children: Secondary | ICD-10-CM | POA: Diagnosis not present

## 2012-11-24 DIAGNOSIS — E669 Obesity, unspecified: Secondary | ICD-10-CM | POA: Diagnosis not present

## 2012-11-24 DIAGNOSIS — E782 Mixed hyperlipidemia: Secondary | ICD-10-CM | POA: Diagnosis not present

## 2012-11-24 DIAGNOSIS — G4733 Obstructive sleep apnea (adult) (pediatric): Secondary | ICD-10-CM | POA: Diagnosis not present

## 2012-12-01 DIAGNOSIS — H5200 Hypermetropia, unspecified eye: Secondary | ICD-10-CM | POA: Diagnosis not present

## 2012-12-01 DIAGNOSIS — H52229 Regular astigmatism, unspecified eye: Secondary | ICD-10-CM | POA: Diagnosis not present

## 2012-12-01 DIAGNOSIS — H524 Presbyopia: Secondary | ICD-10-CM | POA: Diagnosis not present

## 2012-12-01 DIAGNOSIS — E119 Type 2 diabetes mellitus without complications: Secondary | ICD-10-CM | POA: Diagnosis not present

## 2012-12-03 DIAGNOSIS — E782 Mixed hyperlipidemia: Secondary | ICD-10-CM | POA: Diagnosis not present

## 2012-12-03 DIAGNOSIS — J45902 Unspecified asthma with status asthmaticus: Secondary | ICD-10-CM | POA: Diagnosis not present

## 2012-12-03 DIAGNOSIS — E669 Obesity, unspecified: Secondary | ICD-10-CM | POA: Diagnosis not present

## 2012-12-03 DIAGNOSIS — IMO0001 Reserved for inherently not codable concepts without codable children: Secondary | ICD-10-CM | POA: Diagnosis not present

## 2012-12-03 DIAGNOSIS — IMO0002 Reserved for concepts with insufficient information to code with codable children: Secondary | ICD-10-CM | POA: Diagnosis not present

## 2012-12-03 DIAGNOSIS — G4733 Obstructive sleep apnea (adult) (pediatric): Secondary | ICD-10-CM | POA: Diagnosis not present

## 2012-12-03 DIAGNOSIS — F17200 Nicotine dependence, unspecified, uncomplicated: Secondary | ICD-10-CM | POA: Diagnosis not present

## 2012-12-03 DIAGNOSIS — J309 Allergic rhinitis, unspecified: Secondary | ICD-10-CM | POA: Diagnosis not present

## 2013-03-10 DIAGNOSIS — E782 Mixed hyperlipidemia: Secondary | ICD-10-CM | POA: Diagnosis not present

## 2013-03-10 DIAGNOSIS — I10 Essential (primary) hypertension: Secondary | ICD-10-CM | POA: Diagnosis not present

## 2013-03-10 DIAGNOSIS — E119 Type 2 diabetes mellitus without complications: Secondary | ICD-10-CM | POA: Diagnosis not present

## 2013-03-10 DIAGNOSIS — F17200 Nicotine dependence, unspecified, uncomplicated: Secondary | ICD-10-CM | POA: Diagnosis not present

## 2013-03-10 DIAGNOSIS — I6789 Other cerebrovascular disease: Secondary | ICD-10-CM | POA: Diagnosis not present

## 2013-03-17 DIAGNOSIS — F17200 Nicotine dependence, unspecified, uncomplicated: Secondary | ICD-10-CM | POA: Diagnosis not present

## 2013-03-17 DIAGNOSIS — IMO0001 Reserved for inherently not codable concepts without codable children: Secondary | ICD-10-CM | POA: Diagnosis not present

## 2013-03-17 DIAGNOSIS — E782 Mixed hyperlipidemia: Secondary | ICD-10-CM | POA: Diagnosis not present

## 2013-03-17 DIAGNOSIS — J309 Allergic rhinitis, unspecified: Secondary | ICD-10-CM | POA: Diagnosis not present

## 2013-03-17 DIAGNOSIS — IMO0002 Reserved for concepts with insufficient information to code with codable children: Secondary | ICD-10-CM | POA: Diagnosis not present

## 2013-03-17 DIAGNOSIS — G4733 Obstructive sleep apnea (adult) (pediatric): Secondary | ICD-10-CM | POA: Diagnosis not present

## 2013-03-17 DIAGNOSIS — E669 Obesity, unspecified: Secondary | ICD-10-CM | POA: Diagnosis not present

## 2013-03-17 DIAGNOSIS — J45902 Unspecified asthma with status asthmaticus: Secondary | ICD-10-CM | POA: Diagnosis not present

## 2013-03-23 DIAGNOSIS — Z87442 Personal history of urinary calculi: Secondary | ICD-10-CM | POA: Diagnosis not present

## 2013-03-23 DIAGNOSIS — F17200 Nicotine dependence, unspecified, uncomplicated: Secondary | ICD-10-CM | POA: Diagnosis not present

## 2013-03-23 DIAGNOSIS — Z951 Presence of aortocoronary bypass graft: Secondary | ICD-10-CM | POA: Diagnosis not present

## 2013-03-23 DIAGNOSIS — E119 Type 2 diabetes mellitus without complications: Secondary | ICD-10-CM | POA: Diagnosis not present

## 2013-03-23 DIAGNOSIS — S60229A Contusion of unspecified hand, initial encounter: Secondary | ICD-10-CM | POA: Diagnosis not present

## 2013-03-23 DIAGNOSIS — S98139A Complete traumatic amputation of one unspecified lesser toe, initial encounter: Secondary | ICD-10-CM | POA: Diagnosis not present

## 2013-03-23 DIAGNOSIS — Z79899 Other long term (current) drug therapy: Secondary | ICD-10-CM | POA: Diagnosis not present

## 2013-03-23 DIAGNOSIS — Z7982 Long term (current) use of aspirin: Secondary | ICD-10-CM | POA: Diagnosis not present

## 2013-03-23 DIAGNOSIS — R109 Unspecified abdominal pain: Secondary | ICD-10-CM | POA: Diagnosis not present

## 2013-03-23 DIAGNOSIS — M79609 Pain in unspecified limb: Secondary | ICD-10-CM | POA: Diagnosis not present

## 2013-03-23 DIAGNOSIS — I10 Essential (primary) hypertension: Secondary | ICD-10-CM | POA: Diagnosis not present

## 2013-03-23 DIAGNOSIS — Z7902 Long term (current) use of antithrombotics/antiplatelets: Secondary | ICD-10-CM | POA: Diagnosis not present

## 2013-03-23 DIAGNOSIS — IMO0002 Reserved for concepts with insufficient information to code with codable children: Secondary | ICD-10-CM | POA: Diagnosis not present

## 2013-05-19 DIAGNOSIS — R05 Cough: Secondary | ICD-10-CM | POA: Diagnosis not present

## 2013-05-19 DIAGNOSIS — R059 Cough, unspecified: Secondary | ICD-10-CM | POA: Diagnosis not present

## 2013-05-25 DIAGNOSIS — L259 Unspecified contact dermatitis, unspecified cause: Secondary | ICD-10-CM | POA: Diagnosis not present

## 2013-05-25 DIAGNOSIS — L678 Other hair color and hair shaft abnormalities: Secondary | ICD-10-CM | POA: Diagnosis not present

## 2013-05-25 DIAGNOSIS — L738 Other specified follicular disorders: Secondary | ICD-10-CM | POA: Diagnosis not present

## 2013-05-25 DIAGNOSIS — L219 Seborrheic dermatitis, unspecified: Secondary | ICD-10-CM | POA: Diagnosis not present

## 2013-06-15 ENCOUNTER — Encounter: Payer: Self-pay | Admitting: Cardiology

## 2013-06-21 ENCOUNTER — Emergency Department (HOSPITAL_COMMUNITY)
Admission: EM | Admit: 2013-06-21 | Discharge: 2013-06-22 | Disposition: A | Discharge status: Home / Self Care | Payer: Medicare Other | Attending: Emergency Medicine | Admitting: Emergency Medicine

## 2013-06-21 ENCOUNTER — Emergency Department (HOSPITAL_COMMUNITY): Payer: Medicare Other

## 2013-06-21 ENCOUNTER — Encounter (HOSPITAL_COMMUNITY): Payer: Self-pay | Admitting: Emergency Medicine

## 2013-06-21 DIAGNOSIS — R9431 Abnormal electrocardiogram [ECG] [EKG]: Secondary | ICD-10-CM | POA: Diagnosis not present

## 2013-06-21 DIAGNOSIS — J449 Chronic obstructive pulmonary disease, unspecified: Secondary | ICD-10-CM | POA: Insufficient documentation

## 2013-06-21 DIAGNOSIS — R079 Chest pain, unspecified: Secondary | ICD-10-CM | POA: Diagnosis not present

## 2013-06-21 DIAGNOSIS — I252 Old myocardial infarction: Secondary | ICD-10-CM | POA: Insufficient documentation

## 2013-06-21 DIAGNOSIS — F17200 Nicotine dependence, unspecified, uncomplicated: Secondary | ICD-10-CM | POA: Diagnosis not present

## 2013-06-21 DIAGNOSIS — K458 Other specified abdominal hernia without obstruction or gangrene: Secondary | ICD-10-CM | POA: Diagnosis not present

## 2013-06-21 DIAGNOSIS — I10 Essential (primary) hypertension: Secondary | ICD-10-CM | POA: Insufficient documentation

## 2013-06-21 DIAGNOSIS — Q433 Congenital malformations of intestinal fixation: Secondary | ICD-10-CM | POA: Diagnosis not present

## 2013-06-21 DIAGNOSIS — Z7902 Long term (current) use of antithrombotics/antiplatelets: Secondary | ICD-10-CM | POA: Insufficient documentation

## 2013-06-21 DIAGNOSIS — J4489 Other specified chronic obstructive pulmonary disease: Secondary | ICD-10-CM | POA: Insufficient documentation

## 2013-06-21 DIAGNOSIS — F411 Generalized anxiety disorder: Secondary | ICD-10-CM | POA: Diagnosis not present

## 2013-06-21 DIAGNOSIS — M129 Arthropathy, unspecified: Secondary | ICD-10-CM | POA: Insufficient documentation

## 2013-06-21 DIAGNOSIS — F329 Major depressive disorder, single episode, unspecified: Secondary | ICD-10-CM | POA: Diagnosis not present

## 2013-06-21 DIAGNOSIS — Z8673 Personal history of transient ischemic attack (TIA), and cerebral infarction without residual deficits: Secondary | ICD-10-CM | POA: Diagnosis not present

## 2013-06-21 DIAGNOSIS — E785 Hyperlipidemia, unspecified: Secondary | ICD-10-CM | POA: Diagnosis not present

## 2013-06-21 DIAGNOSIS — Z951 Presence of aortocoronary bypass graft: Secondary | ICD-10-CM | POA: Diagnosis not present

## 2013-06-21 DIAGNOSIS — I2510 Atherosclerotic heart disease of native coronary artery without angina pectoris: Secondary | ICD-10-CM | POA: Insufficient documentation

## 2013-06-21 DIAGNOSIS — K469 Unspecified abdominal hernia without obstruction or gangrene: Secondary | ICD-10-CM

## 2013-06-21 DIAGNOSIS — Z9884 Bariatric surgery status: Secondary | ICD-10-CM | POA: Diagnosis not present

## 2013-06-21 DIAGNOSIS — R109 Unspecified abdominal pain: Secondary | ICD-10-CM | POA: Diagnosis not present

## 2013-06-21 DIAGNOSIS — R112 Nausea with vomiting, unspecified: Secondary | ICD-10-CM | POA: Diagnosis not present

## 2013-06-21 DIAGNOSIS — Z9889 Other specified postprocedural states: Secondary | ICD-10-CM | POA: Insufficient documentation

## 2013-06-21 DIAGNOSIS — Z7982 Long term (current) use of aspirin: Secondary | ICD-10-CM | POA: Insufficient documentation

## 2013-06-21 DIAGNOSIS — K439 Ventral hernia without obstruction or gangrene: Secondary | ICD-10-CM | POA: Insufficient documentation

## 2013-06-21 DIAGNOSIS — R1012 Left upper quadrant pain: Secondary | ICD-10-CM | POA: Diagnosis not present

## 2013-06-21 DIAGNOSIS — S98139A Complete traumatic amputation of one unspecified lesser toe, initial encounter: Secondary | ICD-10-CM | POA: Insufficient documentation

## 2013-06-21 DIAGNOSIS — G4733 Obstructive sleep apnea (adult) (pediatric): Secondary | ICD-10-CM | POA: Diagnosis not present

## 2013-06-21 DIAGNOSIS — Z9089 Acquired absence of other organs: Secondary | ICD-10-CM | POA: Diagnosis not present

## 2013-06-21 DIAGNOSIS — Z79899 Other long term (current) drug therapy: Secondary | ICD-10-CM | POA: Diagnosis not present

## 2013-06-21 DIAGNOSIS — F3289 Other specified depressive episodes: Secondary | ICD-10-CM | POA: Insufficient documentation

## 2013-06-21 LAB — CBC WITH DIFFERENTIAL/PLATELET
Basophils Absolute: 0.1 10*3/uL (ref 0.0–0.1)
Basophils Relative: 1 % (ref 0–1)
Eosinophils Absolute: 0.3 10*3/uL (ref 0.0–0.7)
Eosinophils Relative: 3 % (ref 0–5)
HCT: 44.5 % (ref 39.0–52.0)
Hemoglobin: 14.9 g/dL (ref 13.0–17.0)
Lymphocytes Relative: 25 % (ref 12–46)
Lymphs Abs: 3.1 10*3/uL (ref 0.7–4.0)
MCH: 29.5 pg (ref 26.0–34.0)
MCHC: 33.5 g/dL (ref 30.0–36.0)
MCV: 88.1 fL (ref 78.0–100.0)
Monocytes Absolute: 1 10*3/uL (ref 0.1–1.0)
Monocytes Relative: 8 % (ref 3–12)
Neutro Abs: 8 10*3/uL — ABNORMAL HIGH (ref 1.7–7.7)
Neutrophils Relative %: 63 % (ref 43–77)
Platelets: 188 10*3/uL (ref 150–400)
RBC: 5.05 MIL/uL (ref 4.22–5.81)
RDW: 12.9 % (ref 11.5–15.5)
WBC: 12.5 10*3/uL — ABNORMAL HIGH (ref 4.0–10.5)

## 2013-06-21 LAB — COMPREHENSIVE METABOLIC PANEL
ALT: 21 U/L (ref 0–53)
AST: 16 U/L (ref 0–37)
Albumin: 3.6 g/dL (ref 3.5–5.2)
Alkaline Phosphatase: 56 U/L (ref 39–117)
BUN: 16 mg/dL (ref 6–23)
CO2: 25 mEq/L (ref 19–32)
Calcium: 9.3 mg/dL (ref 8.4–10.5)
Chloride: 101 mEq/L (ref 96–112)
Creatinine, Ser: 1.1 mg/dL (ref 0.50–1.35)
GFR calc Af Amer: >90 mL/min (ref >90)
GFR calc non Af Amer: 79 mL/min — ABNORMAL LOW (ref >90)
Glucose, Bld: 117 mg/dL — ABNORMAL HIGH (ref 70–99)
Potassium: 3.5 mEq/L (ref 3.5–5.1)
Sodium: 139 mEq/L (ref 135–145)
Total Bilirubin: 0.3 mg/dL (ref 0.3–1.2)
Total Protein: 7.2 g/dL (ref 6.0–8.3)

## 2013-06-21 LAB — URINALYSIS W MICROSCOPIC + REFLEX CULTURE
Bilirubin Urine: NEGATIVE
Glucose, UA: NEGATIVE mg/dL
Ketones, ur: NEGATIVE mg/dL
Leukocytes, UA: NEGATIVE
Nitrite: NEGATIVE
Protein, ur: NEGATIVE mg/dL
Specific Gravity, Urine: 1.02 (ref 1.005–1.030)
Urobilinogen, UA: 0.2 mg/dL (ref 0.0–1.0)
pH: 7 (ref 5.0–8.0)

## 2013-06-21 LAB — TROPONIN I: Troponin I: <0.3 ng/mL (ref <0.30)

## 2013-06-21 LAB — LACTIC ACID, PLASMA: Lactic Acid, Venous: 1.9 mmol/L (ref 0.5–2.2)

## 2013-06-21 LAB — LIPASE, BLOOD: Lipase: 43 U/L (ref 11–59)

## 2013-06-21 MED ORDER — MORPHINE SULFATE 4 MG/ML IJ SOLN
4.0000 mg | INTRAMUSCULAR | Status: AC | PRN
  Administered 2013-06-21 (×2): 4 mg via INTRAVENOUS
  Filled 2013-06-21 (×2): qty 1

## 2013-06-21 MED ORDER — ONDANSETRON HCL 4 MG/2ML IJ SOLN
4.0000 mg | INTRAMUSCULAR | Status: AC | PRN
  Administered 2013-06-21 – 2013-06-22 (×2): 4 mg via INTRAVENOUS
  Filled 2013-06-21 (×2): qty 2

## 2013-06-21 MED ORDER — FAMOTIDINE IN NACL 20-0.9 MG/50ML-% IV SOLN
20.0000 mg | Freq: Once | INTRAVENOUS | Status: AC
  Administered 2013-06-21: 20 mg via INTRAVENOUS
  Filled 2013-06-21: qty 50

## 2013-06-21 MED ORDER — SODIUM CHLORIDE 0.9 % IV SOLN
INTRAVENOUS | Status: DC
  Administered 2013-06-21: 1000 mL via INTRAVENOUS

## 2013-06-21 NOTE — ED Provider Notes (Signed)
CSN: [id number]     Arrival date & time 06/21/13  2057 History   First MD Initiated Contact with Patient 06/21/13 2143     Chief Complaint  Patient presents with  . Abdominal Pain    HPI Pt was seen at 2200.  Per pt, c/o gradual onset and worsening of constant upper abd "pain" for the past 2 days.  Has been associated with multiple intermittent episodes of N/V.  Denies diarrhea, no fevers, no back pain, no rash, no CP/SOB, no black or blood in stools or emesis.      Past Medical History  Diagnosis Date  . Mixed hyperlipidemia   . Essential hypertension, benign   . COPD (chronic obstructive pulmonary disease)   . Depression   . OSA (obstructive sleep apnea)   . Coronary atherosclerosis of native coronary artery     Multivessel, LVEF 45-50%, echo, 10/2007  . 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 Myocardial infarction 2010  . Stroke july 2013    right sided weakness  . Anxiety   . Arthritis    Past Surgical History  Procedure Laterality Date  . Gastric bypass  2010  . Hernia repair  2011, 2012  . Toe amputation  1998    right 1st and 2nd toe  . Tonsilectomy, adenoidectomy, bilateral myringotomy and tubes    . Dental surgery  2003  . Cholecystectomy    . Coronary artery bypass graft  2010    LIMA to LAD, SVG to diagonal, SVG to OM1 and OM 2, SVG to RCA  . Tonsillectomy    . Ventral hernia repair N/A 10/28/2012    Procedure: LAPAROSCOPIC VENTRAL HERNIA;  Surgeon: Donato Heinz, MD;  Location: [facility];  Service: General;  Laterality: N/A;   Family History  Problem Relation Age of Onset  . Breast cancer Maternal Aunt   . Lung cancer Father    History  Substance Use Topics  . Smoking status: Current Every Day Smoker -- 0.50 packs/day for 30 years    Types: Cigarettes  . Smokeless tobacco: Never Used     Comment: trying to cut back   . Alcohol Use: Yes     Comment: rarely    Review of  Systems ROS: Statement: All systems negative except as marked or noted in the HPI; Constitutional: Negative for fever and chills. ; ; Eyes: Negative for eye pain, redness and discharge. ; ; ENMT: Negative for ear pain, hoarseness, nasal congestion, sinus pressure and sore throat. ; ; Cardiovascular: Negative for chest pain, palpitations, diaphoresis, dyspnea and peripheral edema. ; ; Respiratory: Negative for cough, wheezing and stridor. ; ; Gastrointestinal: +nausea, vomiting, abd pain. Negative for diarrhea, blood in stool, hematemesis, jaundice and rectal bleeding. . ; ; Genitourinary: Negative for dysuria, flank pain and hematuria. ; ; Musculoskeletal: Negative for back pain and neck pain. Negative for swelling and trauma.; ; Skin: Negative for pruritus, rash, abrasions, blisters, bruising and skin lesion.; ; Neuro: Negative for headache, lightheadedness and neck stiffness. Negative for weakness, altered level of consciousness , altered mental status, extremity weakness, paresthesias, involuntary movement, seizure and syncope.      Allergies  Contrast media; Ibuprofen; and Nsaids  Home Medications   Current Outpatient Rx  Name  Route  Sig  Dispense  Refill  . acetaminophen (TYLENOL) 500 MG tablet   Oral   Take 500 mg by mouth every 6 (six)  hours as needed for mild pain, moderate pain or headache.         . albuterol (PROVENTIL HFA;VENTOLIN HFA) 108 (90 BASE) MCG/ACT inhaler   Inhalation   Inhale 2 puffs into the lungs every 6 (six) hours as needed for wheezing or shortness of breath.         . ALPRAZolam (XANAX) 1 MG tablet   Oral   Take 1 mg by mouth 4 (four) times daily.          [name] amLODipine (NORVASC) 5 MG tablet   Oral   Take 5 mg by mouth daily.         [name] aspirin EC 325 MG tablet   Oral   Take 325 mg by mouth daily.         . carvedilol (COREG) 25 MG tablet   Oral   Take 25 mg by mouth 2 (two) times daily with a meal.          . citalopram (CELEXA) 40 MG  tablet   Oral   Take 20 mg by mouth daily.          . cloNIDine (CATAPRES) 0.1 MG tablet   Oral   Take 0.1 mg by mouth daily as needed (*Only taken for blood pressure levels that are >170/100*).         [name] clopidogrel (PLAVIX) 75 MG tablet   Oral   Take 75 mg by mouth daily.         [name] diltiazem (CARDIZEM CD) 240 MG 24 hr capsule   Oral   Take 240 mg by mouth daily.         . fluticasone (FLONASE) 50 MCG/ACT nasal spray   Nasal   Place 2 sprays into the nose daily as needed. For dry nasal passages         . isosorbide mononitrate (IMDUR) 60 MG 24 hr tablet   Oral   Take 60 mg by mouth 2 (two) times daily.          [name] losartan-hydrochlorothiazide (HYZAAR) 100-25 MG per tablet   Oral   Take 1 tablet by mouth daily.         . rosuvastatin (CRESTOR) 40 MG tablet   Oral   Take 40 mg by mouth daily.         . traMADol (ULTRAM) 50 MG tablet   Oral   Take 50 mg by mouth every 6 (six) hours as needed. For pain         . UNKNOWN TO PATIENT   Topical   Apply 1 application topically daily as needed (for psoriasis).         . nitroGLYCERIN (NITROSTAT) 0.4 MG SL tablet   Sublingual   Place 0.4 mg under the tongue every 5 (five) minutes as needed. For chest pain          BP 186/94  Pulse 99  Temp(Src) 98.5 F (36.9 C) (Oral)  Resp 20  Ht 5\' 11"  (1.803 m)  Wt 268 lb (121.564 kg)  BMI 37.39 kg/m2  SpO2 100% Physical Exam 2205: Physical examination:  Nursing notes reviewed; Vital signs and O2 SAT reviewed;  Constitutional: Well developed, Well nourished, Uncomfortable appearing.; Head:  Normocephalic, atraumatic; Eyes: EOMI, PERRL, No scleral icterus; ENMT: Mouth and pharynx normal, Mucous membranes dry; Neck: Supple, Full range of motion, No lymphadenopathy; Cardiovascular: Regular rate and rhythm, No gallop; Respiratory: Breath sounds clear & equal bilaterally, No wheezes.  Speaking full sentences with  ease, Normal respiratory effort/excursion; Chest:  Nontender, Movement normal; Abdomen: Soft, +LUQ, mid-epigastric, RUQ tender to palp. Nondistended, Normal bowel sounds; Genitourinary: No CVA tenderness; Extremities: Pulses normal, No tenderness, No edema, No calf edema or asymmetry.; Neuro: AA&Ox3, Major CN grossly intact.  Speech clear. No facial droop. Moves all extremities on stretcher spontaneously.; Skin: Color normal, Warm, Dry.   ED Course  Procedures   EKG Interpretation    Date/Time:  Monday June 21 2013 22:17:13 EST Ventricular Rate:  91 PR Interval:  126 QRS Duration: 98 QT Interval:  370 QTC Calculation: 455 R Axis:   47 Text Interpretation:  Normal sinus rhythm Possible Left atrial enlargement Anterolateral infarct (cited on or before 04-Sep-2006) T wave abnormality Inferior leads Abnormal ECG When compared with ECG of 11-Oct-2009 16:32, Significant changes have occurred Confirmed by [facility]  MD, Nunzio Cory [phone]) on 06/21/2013 10:36:32 PM            MDM  MDM Reviewed: previous chart, nursing note and vitals Reviewed previous: labs and ECG Interpretation: labs and ECG     Results for orders placed during the hospital encounter of 06/21/13  CBC WITH DIFFERENTIAL      Result Value Range   WBC 12.5 (*) 4.0 - 10.5 K/uL   RBC 5.05  4.22 - 5.81 MIL/uL   Hemoglobin 14.9  13.0 - 17.0 g/dL   HCT 44.5  39.0 - 52.0 %   MCV 88.1  78.0 - 100.0 fL   MCH 29.5  26.0 - 34.0 pg   MCHC 33.5  30.0 - 36.0 g/dL   RDW 12.9  11.5 - 15.5 %   Platelets 188  150 - 400 K/uL   Neutrophils Relative % 63  43 - 77 %   Neutro Abs 8.0 (*) 1.7 - 7.7 K/uL   Lymphocytes Relative 25  12 - 46 %   Lymphs Abs 3.1  0.7 - 4.0 K/uL   Monocytes Relative 8  3 - 12 %   Monocytes Absolute 1.0  0.1 - 1.0 K/uL   Eosinophils Relative 3  0 - 5 %   Eosinophils Absolute 0.3  0.0 - 0.7 K/uL   Basophils Relative 1  0 - 1 %   Basophils Absolute 0.1  0.0 - 0.1 K/uL  COMPREHENSIVE METABOLIC PANEL      Result Value Range   Sodium 139  135 - 145 mEq/L    Potassium 3.5  3.5 - 5.1 mEq/L   Chloride 101  96 - 112 mEq/L   CO2 25  19 - 32 mEq/L   Glucose, Bld 117 (*) 70 - 99 mg/dL   BUN 16  6 - 23 mg/dL   Creatinine, Ser 1.10  0.50 - 1.35 mg/dL   Calcium 9.3  8.4 - 10.5 mg/dL   Total Protein 7.2  6.0 - 8.3 g/dL   Albumin 3.6  3.5 - 5.2 g/dL   AST 16  0 - 37 U/L   ALT 21  0 - 53 U/L   Alkaline Phosphatase 56  39 - 117 U/L   Total Bilirubin 0.3  0.3 - 1.2 mg/dL   GFR calc non Af Amer 79 (*) >90 mL/min   GFR calc Af Amer >90  >90 mL/min  LIPASE, BLOOD      Result Value Range   Lipase 43  11 - 59 U/L  URINALYSIS W MICROSCOPIC + REFLEX CULTURE      Result Value Range   Color, Urine YELLOW  YELLOW   APPearance CLEAR  CLEAR   Specific Gravity, Urine 1.020  1.005 - 1.030   pH 7.0  5.0 - 8.0   Glucose, UA NEGATIVE  NEGATIVE mg/dL   Hgb urine dipstick TRACE (*) NEGATIVE   Bilirubin Urine NEGATIVE  NEGATIVE   Ketones, ur NEGATIVE  NEGATIVE mg/dL   Protein, ur NEGATIVE  NEGATIVE mg/dL   Urobilinogen, UA 0.2  0.0 - 1.0 mg/dL   Nitrite NEGATIVE  NEGATIVE   Leukocytes, UA NEGATIVE  NEGATIVE   WBC, UA 0-2  <3 WBC/hpf   RBC / HPF 0-2  <3 RBC/hpf   Bacteria, UA FEW (*) RARE   Squamous Epithelial / LPF MANY (*) RARE  TROPONIN I      Result Value Range   Troponin I <0.30  <0.30 ng/mL  LACTIC ACID, PLASMA      Result Value Range   Lactic Acid, Venous 1.9  0.5 - 2.2 mmol/L    0010:  CXR and CT A/P pending. EKG with new TWA from previous. Pt continues to c/o nausea and abd pain despite IV meds. Will remedicate. Sign out to Dr. Sabra Heck.      Alfonzo Feller, DO 06/22/13 [phone]

## 2013-06-21 NOTE — ED Notes (Signed)
Started drinking contrast. No nausea

## 2013-06-21 NOTE — ED Notes (Signed)
Pt reporting pain in LUQ.  Reports pain began on Friday, and has gotten worse since then. Reporting some nausea and vomiting as well.

## 2013-06-22 DIAGNOSIS — R1012 Left upper quadrant pain: Secondary | ICD-10-CM | POA: Diagnosis not present

## 2013-06-22 DIAGNOSIS — R112 Nausea with vomiting, unspecified: Secondary | ICD-10-CM | POA: Diagnosis not present

## 2013-06-22 DIAGNOSIS — R079 Chest pain, unspecified: Secondary | ICD-10-CM | POA: Diagnosis not present

## 2013-06-22 MED ORDER — FENTANYL CITRATE 0.05 MG/ML IJ SOLN
INTRAMUSCULAR | Status: AC
  Filled 2013-06-22: qty 2

## 2013-06-22 MED ORDER — OXYCODONE-ACETAMINOPHEN 5-325 MG PO TABS: 1.0000 | ORAL_TABLET | ORAL | Status: DC | PRN

## 2013-06-22 MED ORDER — ALBUTEROL SULFATE (5 MG/ML) 0.5% IN NEBU
5.0000 mg | INHALATION_SOLUTION | Freq: Once | RESPIRATORY_TRACT | Status: AC
  Administered 2013-06-22: 5 mg via RESPIRATORY_TRACT
  Filled 2013-06-22: qty 1

## 2013-06-22 MED ORDER — FENTANYL CITRATE 0.05 MG/ML IJ SOLN
100.0000 ug | Freq: Once | INTRAMUSCULAR | Status: AC
  Administered 2013-06-22: 100 ug via INTRAVENOUS

## 2013-06-22 NOTE — ED Provider Notes (Addendum)
Patient presents with left upper quadrant and left lower chest pain. He states this started Friday, he was pounding a stake into the ground while putting up Christmas decorations when he had acute onset of a pop and persistent chest in the left side of his upper abdomen and lower chest. This is worse with deep breathing causing him to take shallow breaths, he has no nausea vomiting or other abdominal pain. He has had a recent mesh hernia repair of his midabdomen, on exam he has a soft nontender abdomen except for the left upper quadrant which has mild tenderness however he does have left chest wall tenderness. He states that this exactly reproduces his symptoms. He has a very mild cough over the last hour but states that he felt some liquid go down the trachea while he was taking a drink. 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 His labs show a nonspecific leukocytosis, normal troponin. His EKG is abnormal and I have discussed this finding with the patient and recommended admission to the hospital he has refused stating that he wants to go home as he does not feel like his pain is related to his heart. We had some discussion at length about this and the patient again refuses admission to the hospital, he has medical decision-making capacity and is able to tell me the consequences of heart attack and other life-threatening causes of chest pain. He requests an albuterol treatment for his breathing, his lungs are clear on my exam. His oxygen saturation is 94% but I believe that this is because he is splinting to breathe because of the pain with deep breathing. He possibly has injured a muscle or ligament in his chest but there does not appear to be any fracture of the ribs  or pneumothorax.  After albuterol and fentanyl the patient felt much better, he was able to ambulate with no changes in his oxygenation. It was mid 90% on return to the bed, he states that he does have oxygen and CPAP at home which he sleeps with at night. He feels like he still wants to go home, will return should symptoms worsen. I have reviewed all of his results with him including the CT scan findings. I have asked him to followup with his family doctor tomorrow and his general surgeon as well to review these findings.  Johnna Acosta, MD 06/22/13 [id number]  Johnna Acosta, MD 06/22/13 [phone]

## 2013-06-22 NOTE — ED Notes (Signed)
Ambulated in hall with portable pulse ox, exertionally dyspnec, dropped sats to 88% r/a walking, returned promptly to 93% r/a when sitting down. Pt still feels well enough to go home. Dr Sabra Heck @ bedside

## 2013-06-25 DIAGNOSIS — IMO0001 Reserved for inherently not codable concepts without codable children: Secondary | ICD-10-CM | POA: Diagnosis not present

## 2013-06-25 DIAGNOSIS — R109 Unspecified abdominal pain: Secondary | ICD-10-CM | POA: Diagnosis not present

## 2013-07-02 DIAGNOSIS — E669 Obesity, unspecified: Secondary | ICD-10-CM | POA: Diagnosis not present

## 2013-07-02 DIAGNOSIS — IMO0001 Reserved for inherently not codable concepts without codable children: Secondary | ICD-10-CM | POA: Diagnosis not present

## 2013-07-02 DIAGNOSIS — E782 Mixed hyperlipidemia: Secondary | ICD-10-CM | POA: Diagnosis not present

## 2013-07-02 DIAGNOSIS — I10 Essential (primary) hypertension: Secondary | ICD-10-CM | POA: Diagnosis not present

## 2013-07-02 DIAGNOSIS — F17200 Nicotine dependence, unspecified, uncomplicated: Secondary | ICD-10-CM | POA: Diagnosis not present

## 2013-07-12 DIAGNOSIS — Z1331 Encounter for screening for depression: Secondary | ICD-10-CM | POA: Diagnosis not present

## 2013-07-12 DIAGNOSIS — E782 Mixed hyperlipidemia: Secondary | ICD-10-CM | POA: Diagnosis not present

## 2013-07-12 DIAGNOSIS — IMO0001 Reserved for inherently not codable concepts without codable children: Secondary | ICD-10-CM | POA: Diagnosis not present

## 2013-07-12 DIAGNOSIS — F17200 Nicotine dependence, unspecified, uncomplicated: Secondary | ICD-10-CM | POA: Diagnosis not present

## 2013-07-12 DIAGNOSIS — IMO0002 Reserved for concepts with insufficient information to code with codable children: Secondary | ICD-10-CM | POA: Diagnosis not present

## 2013-07-12 DIAGNOSIS — E669 Obesity, unspecified: Secondary | ICD-10-CM | POA: Diagnosis not present

## 2013-07-12 DIAGNOSIS — G4733 Obstructive sleep apnea (adult) (pediatric): Secondary | ICD-10-CM | POA: Diagnosis not present

## 2013-07-12 DIAGNOSIS — J309 Allergic rhinitis, unspecified: Secondary | ICD-10-CM | POA: Diagnosis not present

## 2013-07-20 NOTE — Patient Instructions (Signed)
[name]  07/20/2013   Your procedure is scheduled on:  07/23/2013  Report to [facility] at  98  AM.  Call this number if you have problems the morning of surgery: [phone]   Remember:   Do not eat food or drink liquids after midnight.   Take these medicines the morning of surgery with A SIP OF WATER: xanax, norvasc, coreg,celexa, catapress, cardiazem, imdur, hyzaar, percocet,ultram. Take your proventil before you come.   Do not wear jewelry, make-up or nail polish.  Do not wear lotions, powders, or perfumes.   Do not shave 48 hours prior to surgery. Men may shave face and neck.  Do not bring valuables to the hospital.  [facility] is not responsible for any belongings or valuables.               Contacts, dentures or bridgework may not be worn into surgery.  Leave suitcase in the car. After surgery it may be brought to your room.  For patients admitted to the hospital, discharge time is determined by your  treatment team.               Patients discharged the day of surgery will not be allowed to drive home.  Name and phone number of your driver: family  Special Instructions: Shower using CHG 2 nights before surgery and the night before surgery.  If you shower the day of surgery use CHG.  Use special wash - you have one bottle of CHG for all showers.  You should use approximately 1/3 of the bottle for each shower.   Please read over the following fact sheets that you were given: Pain Booklet, Coughing and Deep Breathing, Surgical Site Infection Prevention, Anesthesia Post-op Instructions and Care and Recovery After Surgery Hernia A hernia happens when an organ inside your body pushes out through a weak spot in your belly (abdominal) wall. Most hernias get worse over time. They can often be pushed back into place (reduced). Surgery may be needed to repair hernias that cannot be pushed into place. HOME CARE  Keep doing normal activities.  Avoid lifting more than  10 pounds (4.5 kilograms).  Cough gently and avoid straining. Over time, these things will:  Increase your hernia size.  Irritate your hernia.  Break down hernia repairs.  Stop smoking.  Do not wear anything tight over your hernia. Do not keep the hernia in with an outside bandage.  Eat food that is high in fiber (fruit, vegetables, whole grains).  Drink enough fluids to keep your pee (urine) clear or pale yellow.  Take medicines to make your poop soft (stool softeners) if you cannot poop (constipated). GET HELP RIGHT AWAY IF:   You have a fever.  You have belly pain that gets worse.  You feel sick to your stomach (nauseous) and throw up (vomit).  Your skin starts to bulge out.  Your hernia turns a different color, feels hard, or is tender.  You have increased pain or puffiness (swelling) around the hernia.  You poop more or less often.  Your poop does not look the way normally does.  You have watery poop (diarrhea).  You cannot push the hernia back in place by applying gentle pressure while lying down. MAKE SURE YOU:   Understand these instructions.  Will watch your condition.  Will get help right away if you are not doing well or get worse. Document Released: 12/19/2009 Document Revised: 09/23/2011 Document Reviewed: 12/19/2009  ExitCare Patient Information 2014 [place]. PATIENT INSTRUCTIONS POST-ANESTHESIA  IMMEDIATELY FOLLOWING SURGERY:  Do not drive or operate machinery for the first twenty four hours after surgery.  Do not make any important decisions for twenty four hours after surgery or while taking narcotic pain medications or sedatives.  If you develop intractable nausea and vomiting or a severe headache please notify your doctor immediately.  FOLLOW-UP:  Please make an appointment with your surgeon as instructed. You do not need to follow up with anesthesia unless specifically instructed to do so.  WOUND CARE INSTRUCTIONS (if applicable):   Keep a dry clean dressing on the anesthesia/puncture wound site if there is drainage.  Once the wound has quit draining you may leave it open to air.  Generally you should leave the bandage intact for twenty four hours unless there is drainage.  If the epidural site drains for more than 36-48 hours please call the anesthesia department.  QUESTIONS?:  Please feel free to call your physician or the hospital operator if you have any questions, and they will be happy to assist you.

## 2013-07-20 NOTE — H&P (Signed)
  Post Operative Follow Up - 06/29/2013  Patient Name: [name] Date of Birth: 1965/10/30  Vital Signs:  Vitals as of: 123XX123: Systolic 99991111: Diastolic 123456: Heart Rate 83: Temp 36.28C: Height 180CM: Weight 118.84KG: Pain Level 8: BMI 36.54   BMI: 36.54 kg/m2  Subjective: This 48 Years old Male presents for followup.  Had an episode of epigastric pain recently after bending over and now has increased swelling and left sided abdominal pain.  CT scan of the abdomen reveals a recurrent incisional hernia in the epigastric. Patient has    significant complaints.   Social History: Reviewed  Social History  Preferred Language: English Race:  White Ethnicity: Not Hispanic / Latino Age: 32 Years 4 Months Marital Status:  M Alcohol:  occasional Recreational drug(s):  none       Allergies:  Allergies Insert Code: No allergies found.     Objective: General: Well appearing, well nourished in no distress. Abdomen soft, incisional hernia present in epigastric region.  Appears to be along superior aspect of the previously repaired hernia.   Assessment: Recurrent incisional hernia     Plan: Scheduled for recurrent incisional herniorrhaphy with mesh on 07/23/13.  Risks and benefits of procedure including bleeding, infection, and recurrence of the hernia were fully explained to the patient, who gives informed consent.

## 2013-07-21 ENCOUNTER — Encounter (HOSPITAL_COMMUNITY): Payer: Self-pay | Admitting: Pharmacy Technician

## 2013-07-21 ENCOUNTER — Encounter (HOSPITAL_COMMUNITY): Payer: Self-pay

## 2013-07-21 ENCOUNTER — Encounter (HOSPITAL_COMMUNITY)
Admission: RE | Admit: 2013-07-21 | Discharge: 2013-07-21 | Disposition: A | Discharge status: Home / Self Care | Payer: Medicare Other | Source: Ambulatory Visit | Attending: General Surgery | Admitting: General Surgery

## 2013-07-21 DIAGNOSIS — J449 Chronic obstructive pulmonary disease, unspecified: Secondary | ICD-10-CM | POA: Diagnosis not present

## 2013-07-21 DIAGNOSIS — K432 Incisional hernia without obstruction or gangrene: Secondary | ICD-10-CM | POA: Diagnosis not present

## 2013-07-21 DIAGNOSIS — Z01810 Encounter for preprocedural cardiovascular examination: Secondary | ICD-10-CM | POA: Diagnosis not present

## 2013-07-21 DIAGNOSIS — Z951 Presence of aortocoronary bypass graft: Secondary | ICD-10-CM | POA: Diagnosis not present

## 2013-07-21 DIAGNOSIS — Z01812 Encounter for preprocedural laboratory examination: Secondary | ICD-10-CM | POA: Diagnosis not present

## 2013-07-21 DIAGNOSIS — I10 Essential (primary) hypertension: Secondary | ICD-10-CM | POA: Diagnosis not present

## 2013-07-21 DIAGNOSIS — E119 Type 2 diabetes mellitus without complications: Secondary | ICD-10-CM | POA: Diagnosis not present

## 2013-07-21 LAB — BASIC METABOLIC PANEL
BUN: 14 mg/dL (ref 6–23)
CO2: 24 mEq/L (ref 19–32)
Calcium: 9.5 mg/dL (ref 8.4–10.5)
Chloride: 105 mEq/L (ref 96–112)
Creatinine, Ser: 1 mg/dL (ref 0.50–1.35)
GFR calc Af Amer: >90 mL/min (ref >90)
GFR calc non Af Amer: 89 mL/min — ABNORMAL LOW (ref >90)
Glucose, Bld: 105 mg/dL — ABNORMAL HIGH (ref 70–99)
Potassium: 4.1 mEq/L (ref 3.7–5.3)
Sodium: 142 mEq/L (ref 137–147)

## 2013-07-21 LAB — CBC WITH DIFFERENTIAL/PLATELET
Basophils Absolute: 0.1 10*3/uL (ref 0.0–0.1)
Basophils Relative: 1 % (ref 0–1)
Eosinophils Absolute: 0.3 10*3/uL (ref 0.0–0.7)
Eosinophils Relative: 3 % (ref 0–5)
HCT: 48.5 % (ref 39.0–52.0)
Hemoglobin: 16.7 g/dL (ref 13.0–17.0)
Lymphocytes Relative: 27 % (ref 12–46)
Lymphs Abs: 2.7 10*3/uL (ref 0.7–4.0)
MCH: 30.4 pg (ref 26.0–34.0)
MCHC: 34.4 g/dL (ref 30.0–36.0)
MCV: 88.3 fL (ref 78.0–100.0)
Monocytes Absolute: 0.9 10*3/uL (ref 0.1–1.0)
Monocytes Relative: 9 % (ref 3–12)
Neutro Abs: 6 10*3/uL (ref 1.7–7.7)
Neutrophils Relative %: 61 % (ref 43–77)
Platelets: 200 10*3/uL (ref 150–400)
RBC: 5.49 MIL/uL (ref 4.22–5.81)
RDW: 12.9 % (ref 11.5–15.5)
WBC: 9.9 10*3/uL (ref 4.0–10.5)

## 2013-07-23 ENCOUNTER — Encounter (HOSPITAL_COMMUNITY): Payer: Self-pay | Admitting: *Deleted

## 2013-07-23 ENCOUNTER — Ambulatory Visit (HOSPITAL_COMMUNITY): Payer: Medicare Other | Admitting: Anesthesiology

## 2013-07-23 ENCOUNTER — Encounter (HOSPITAL_COMMUNITY): Payer: Medicare Other | Admitting: Anesthesiology

## 2013-07-23 ENCOUNTER — Ambulatory Visit (HOSPITAL_COMMUNITY)
Admission: RE | Admit: 2013-07-23 | Discharge: 2013-07-23 | Disposition: A | Discharge status: Home / Self Care | Payer: Medicare Other | Source: Ambulatory Visit | Attending: General Surgery | Admitting: General Surgery

## 2013-07-23 ENCOUNTER — Encounter (HOSPITAL_COMMUNITY)
Admission: RE | Disposition: A | Discharge status: Home / Self Care | Payer: Self-pay | Source: Ambulatory Visit | Attending: General Surgery

## 2013-07-23 DIAGNOSIS — J449 Chronic obstructive pulmonary disease, unspecified: Secondary | ICD-10-CM | POA: Diagnosis not present

## 2013-07-23 DIAGNOSIS — J4489 Other specified chronic obstructive pulmonary disease: Secondary | ICD-10-CM | POA: Insufficient documentation

## 2013-07-23 DIAGNOSIS — I10 Essential (primary) hypertension: Secondary | ICD-10-CM | POA: Insufficient documentation

## 2013-07-23 DIAGNOSIS — Z951 Presence of aortocoronary bypass graft: Secondary | ICD-10-CM | POA: Diagnosis not present

## 2013-07-23 DIAGNOSIS — Z01810 Encounter for preprocedural cardiovascular examination: Secondary | ICD-10-CM | POA: Insufficient documentation

## 2013-07-23 DIAGNOSIS — Z01812 Encounter for preprocedural laboratory examination: Secondary | ICD-10-CM | POA: Diagnosis not present

## 2013-07-23 DIAGNOSIS — K432 Incisional hernia without obstruction or gangrene: Secondary | ICD-10-CM | POA: Diagnosis not present

## 2013-07-23 DIAGNOSIS — E119 Type 2 diabetes mellitus without complications: Secondary | ICD-10-CM | POA: Insufficient documentation

## 2013-07-23 HISTORY — PX: INSERTION OF MESH: SHX5868

## 2013-07-23 HISTORY — PX: INCISIONAL HERNIA REPAIR: SHX193

## 2013-07-23 LAB — GLUCOSE, CAPILLARY
Glucose-Capillary: 99 mg/dL (ref 70–99)
Glucose-Capillary: 96 mg/dL (ref 70–99)

## 2013-07-23 SURGERY — REPAIR, HERNIA, INCISIONAL
Anesthesia: General | Site: Abdomen

## 2013-07-23 MED ORDER — MIDAZOLAM HCL 2 MG/2ML IJ SOLN
1.0000 mg | INTRAMUSCULAR | Status: DC | PRN
  Administered 2013-07-23 (×2): 2 mg via INTRAVENOUS
  Filled 2013-07-23 (×2): qty 2

## 2013-07-23 MED ORDER — LIDOCAINE HCL (CARDIAC) 20 MG/ML IV SOLN
INTRAVENOUS | Status: DC | PRN
  Administered 2013-07-23: 50 mg via INTRAVENOUS

## 2013-07-23 MED ORDER — MIDAZOLAM HCL 5 MG/5ML IJ SOLN
INTRAMUSCULAR | Status: DC | PRN
  Administered 2013-07-23: 2 mg via INTRAVENOUS

## 2013-07-23 MED ORDER — BUPIVACAINE HCL (PF) 0.5 % IJ SOLN
INTRAMUSCULAR | Status: AC
  Filled 2013-07-23: qty 30

## 2013-07-23 MED ORDER — MIDAZOLAM HCL 2 MG/2ML IJ SOLN
INTRAMUSCULAR | Status: AC
  Filled 2013-07-23: qty 2

## 2013-07-23 MED ORDER — SODIUM CHLORIDE 0.9 % IR SOLN
Status: DC | PRN
  Administered 2013-07-23: 1000 mL

## 2013-07-23 MED ORDER — NEOSTIGMINE METHYLSULFATE 1 MG/ML IJ SOLN
INTRAMUSCULAR | Status: DC | PRN
  Administered 2013-07-23: 4 mg via INTRAVENOUS

## 2013-07-23 MED ORDER — FENTANYL CITRATE 0.05 MG/ML IJ SOLN
25.0000 ug | INTRAMUSCULAR | Status: DC | PRN
  Administered 2013-07-23 (×2): 25 ug via INTRAVENOUS
  Filled 2013-07-23 (×2): qty 2

## 2013-07-23 MED ORDER — LIDOCAINE HCL (PF) 1 % IJ SOLN
INTRAMUSCULAR | Status: AC
  Filled 2013-07-23: qty 5

## 2013-07-23 MED ORDER — ENOXAPARIN SODIUM 40 MG/0.4ML ~~LOC~~ SOLN
SUBCUTANEOUS | Status: AC
  Filled 2013-07-23: qty 0.4

## 2013-07-23 MED ORDER — FENTANYL CITRATE 0.05 MG/ML IJ SOLN
INTRAMUSCULAR | Status: AC
  Filled 2013-07-23: qty 5

## 2013-07-23 MED ORDER — GLYCOPYRROLATE 0.2 MG/ML IJ SOLN
INTRAMUSCULAR | Status: DC | PRN
  Administered 2013-07-23: 0.6 mg via INTRAVENOUS

## 2013-07-23 MED ORDER — ONDANSETRON HCL 4 MG/2ML IJ SOLN: 4.0000 mg | Freq: Once | INTRAMUSCULAR | Status: DC | PRN

## 2013-07-23 MED ORDER — ONDANSETRON HCL 4 MG/2ML IJ SOLN
4.0000 mg | Freq: Once | INTRAMUSCULAR | Status: AC
  Administered 2013-07-23: 4 mg via INTRAVENOUS
  Filled 2013-07-23: qty 2

## 2013-07-23 MED ORDER — ROCURONIUM BROMIDE 50 MG/5ML IV SOLN
INTRAVENOUS | Status: AC
  Filled 2013-07-23: qty 1

## 2013-07-23 MED ORDER — CEFAZOLIN SODIUM 1-5 GM-% IV SOLN
INTRAVENOUS | Status: AC
  Filled 2013-07-23: qty 100

## 2013-07-23 MED ORDER — SUCCINYLCHOLINE CHLORIDE 20 MG/ML IJ SOLN
INTRAMUSCULAR | Status: DC | PRN
  Administered 2013-07-23: 140 mg via INTRAVENOUS

## 2013-07-23 MED ORDER — LACTATED RINGERS IV SOLN
INTRAVENOUS | Status: DC
  Administered 2013-07-23: 11:00:00 via INTRAVENOUS

## 2013-07-23 MED ORDER — CEFAZOLIN SODIUM-DEXTROSE 2-3 GM-% IV SOLR
2.0000 g | INTRAVENOUS | Status: AC
  Administered 2013-07-23: 2 g via INTRAVENOUS

## 2013-07-23 MED ORDER — LABETALOL HCL 5 MG/ML IV SOLN
INTRAVENOUS | Status: AC
  Filled 2013-07-23: qty 4

## 2013-07-23 MED ORDER — HYDROCODONE-ACETAMINOPHEN 7.5-325 MG PO TABS: 1.0000 | ORAL_TABLET | ORAL | Status: DC | PRN

## 2013-07-23 MED ORDER — PROPOFOL 10 MG/ML IV BOLUS
INTRAVENOUS | Status: AC
  Filled 2013-07-23: qty 20

## 2013-07-23 MED ORDER — BUPIVACAINE LIPOSOME 1.3 % IJ SUSP
20.0000 mL | Freq: Once | INTRAMUSCULAR | Status: AC
  Administered 2013-07-23: 10 mL
  Filled 2013-07-23: qty 20

## 2013-07-23 MED ORDER — GLYCOPYRROLATE 0.2 MG/ML IJ SOLN
INTRAMUSCULAR | Status: AC
  Filled 2013-07-23: qty 3

## 2013-07-23 MED ORDER — LABETALOL HCL 5 MG/ML IV SOLN
10.0000 mg | Freq: Once | INTRAVENOUS | Status: AC
Start: 2013-07-23 — End: 2013-07-23
  Administered 2013-07-23: 10 mg via INTRAVENOUS

## 2013-07-23 MED ORDER — LACTATED RINGERS IV SOLN
INTRAVENOUS | Status: DC | PRN
  Administered 2013-07-23 (×2): via INTRAVENOUS

## 2013-07-23 MED ORDER — FENTANYL CITRATE 0.05 MG/ML IJ SOLN
INTRAMUSCULAR | Status: DC | PRN
  Administered 2013-07-23: 50 ug via INTRAVENOUS
  Administered 2013-07-23 (×2): 100 ug via INTRAVENOUS
  Administered 2013-07-23: 50 ug via INTRAVENOUS

## 2013-07-23 MED ORDER — POVIDONE-IODINE 10 % EX OINT
TOPICAL_OINTMENT | CUTANEOUS | Status: AC
  Filled 2013-07-23: qty 1

## 2013-07-23 MED ORDER — CHLORHEXIDINE GLUCONATE 4 % EX LIQD: 1.0000 "application " | Freq: Once | CUTANEOUS | Status: DC

## 2013-07-23 MED ORDER — POVIDONE-IODINE 10 % OINT PACKET
TOPICAL_OINTMENT | CUTANEOUS | Status: DC | PRN
  Administered 2013-07-23: 1 via TOPICAL

## 2013-07-23 MED ORDER — PROPOFOL 10 MG/ML IV BOLUS
INTRAVENOUS | Status: DC | PRN
  Administered 2013-07-23: 150 mg via INTRAVENOUS

## 2013-07-23 MED ORDER — ENOXAPARIN SODIUM 40 MG/0.4ML ~~LOC~~ SOLN
40.0000 mg | Freq: Once | SUBCUTANEOUS | Status: AC
  Administered 2013-07-23: 40 mg via SUBCUTANEOUS

## 2013-07-23 SURGICAL SUPPLY — 39 items
BAG HAMPER (MISCELLANEOUS) ×2 IMPLANT
CLOTH BEACON ORANGE TIMEOUT ST (SAFETY) ×2 IMPLANT
COVER LIGHT HANDLE STERIS (MISCELLANEOUS) ×4 IMPLANT
DURAPREP 26ML APPLICATOR (WOUND CARE) ×2 IMPLANT
ELECT REM PT RETURN 9FT ADLT (ELECTROSURGICAL) ×2
ELECTRODE REM PT RTRN 9FT ADLT (ELECTROSURGICAL) ×1 IMPLANT
GLOVE BIO SURGEON STRL SZ7 (GLOVE) ×1 IMPLANT
GLOVE BIOGEL PI IND STRL 7.0 (GLOVE) IMPLANT
GLOVE BIOGEL PI IND STRL 7.5 (GLOVE) IMPLANT
GLOVE BIOGEL PI IND STRL 8 (GLOVE) ×1 IMPLANT
GLOVE BIOGEL PI INDICATOR 7.0 (GLOVE) ×2
GLOVE BIOGEL PI INDICATOR 7.5 (GLOVE) ×1
GLOVE BIOGEL PI INDICATOR 8 (GLOVE) ×1
GLOVE ECLIPSE 6.5 STRL STRAW (GLOVE) ×2 IMPLANT
GLOVE ECLIPSE 7.0 STRL STRAW (GLOVE) ×2 IMPLANT
GLOVE ECLIPSE 7.5 STRL STRAW (GLOVE) ×2 IMPLANT
GLOVE EXAM NITRILE LRG STRL (GLOVE) ×1 IMPLANT
GOWN STRL REIN XL XLG (GOWN DISPOSABLE) ×8 IMPLANT
INST SET MAJOR GENERAL (KITS) ×2 IMPLANT
KIT ROOM TURNOVER APOR (KITS) ×2 IMPLANT
MANIFOLD NEPTUNE II (INSTRUMENTS) ×2 IMPLANT
MESH VENTRALIGHT ST 6X8 (Mesh Specialty) ×2 IMPLANT
MESH VENTRLGHT ELLIPSE 8X6XMFL (Mesh Specialty) IMPLANT
NDL HYPO 18GX1.5 BLUNT FILL (NEEDLE) IMPLANT
NDL HYPO 25X1 1.5 SAFETY (NEEDLE) ×1 IMPLANT
NEEDLE HYPO 18GX1.5 BLUNT FILL (NEEDLE) ×2 IMPLANT
NEEDLE HYPO 25X1 1.5 SAFETY (NEEDLE) ×2 IMPLANT
NS IRRIG 1000ML POUR BTL (IV SOLUTION) ×2 IMPLANT
PACK ABDOMINAL MAJOR (CUSTOM PROCEDURE TRAY) ×2 IMPLANT
PAD ARMBOARD 7.5X6 YLW CONV (MISCELLANEOUS) ×2 IMPLANT
SET BASIN LINEN APH (SET/KITS/TRAYS/PACK) ×2 IMPLANT
SPONGE GAUZE 4X4 12PLY (GAUZE/BANDAGES/DRESSINGS) ×2 IMPLANT
STAPLER VISISTAT (STAPLE) ×1 IMPLANT
SUT PROLENE 0 CT 1 CR/8 (SUTURE) ×2 IMPLANT
SUT VIC AB 2-0 CT1 27 (SUTURE) ×4
SUT VIC AB 2-0 CT1 TAPERPNT 27 (SUTURE) ×1 IMPLANT
SYR 30ML LL (SYRINGE) ×1 IMPLANT
SYR CONTROL 10ML LL (SYRINGE) ×2 IMPLANT
TAPE CLOTH SURG 4X10 WHT LF (GAUZE/BANDAGES/DRESSINGS) ×1 IMPLANT

## 2013-07-23 NOTE — Anesthesia Postprocedure Evaluation (Signed)
  Anesthesia Post-op Note  Patient: [name]  Procedure(s) Performed: Procedure(s): HERNIA REPAIR INCISIONAL WITH MESH (N/A) INSERTION OF MESH (N/A)  Patient Location: PACU  Anesthesia Type:General  Level of Consciousness: sedated and patient cooperative  Airway and Oxygen Therapy: Patient Spontanous Breathing and Patient connected to face mask oxygen  Post-op Pain: mild  Post-op Assessment: Post-op Vital signs reviewed, Patient's Cardiovascular Status Stable, Respiratory Function Stable, Patent Airway, No signs of Nausea or vomiting and Pain level controlled  Post-op Vital Signs: Reviewed and stable  Complications: No apparent anesthesia complications

## 2013-07-23 NOTE — Interval H&P Note (Signed)
History and Physical Interval Note:  07/23/2013 11:42 AM  [name]  has presented today for surgery, with the diagnosis of recurrent incisional hernia  The various methods of treatment have been discussed with the patient and family. After consideration of risks, benefits and other options for treatment, the patient has consented to  Procedure(s): HERNIA REPAIR INCISIONAL WITH MESH (N/A) as a surgical intervention .  The patient's history has been reviewed, patient examined, no change in status, stable for surgery.  I have reviewed the patient's chart and labs.  Questions were answered to the patient's satisfaction.     [name] A

## 2013-07-23 NOTE — Anesthesia Preprocedure Evaluation (Signed)
Anesthesia Evaluation  Patient identified by MRN, date of birth, ID band Patient awake    Reviewed: Allergy & Precautions, H&P , NPO status , Patient's Chart, lab work & pertinent test results  History of Anesthesia Complications Negative for: history of anesthetic complications  Airway Mallampati: I TM Distance: >3 FB Neck ROM: Full    Dental  (+) Edentulous Upper and Edentulous Lower   Pulmonary sleep apnea and Continuous Positive Airway Pressure Ventilation , COPD COPD inhaler, Current Smoker,  breath sounds clear to auscultation        Cardiovascular hypertension, Pt. 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 Rhythm:Regular Rate:Normal     Neuro/Psych PSYCHIATRIC DISORDERS Anxiety Depression    GI/Hepatic   Endo/Other  diabetes, Well Controlled, Type 2Morbid obesity  Renal/GU      Musculoskeletal   Abdominal   Peds  Hematology   Anesthesia Other Findings   Reproductive/Obstetrics                           Anesthesia Physical Anesthesia Plan  ASA: III  Anesthesia Plan: General   Post-op Pain Management:    Induction: Intravenous, Rapid sequence and Cricoid pressure planned  Airway Management Planned: Oral ETT  Additional Equipment:   Intra-op Plan:   Post-operative Plan: Extubation in OR  Informed Consent: I have reviewed the patients History and Physical, chart, labs and discussed the procedure including the risks, benefits and alternatives for the proposed anesthesia with the patient or authorized representative who has indicated his/her understanding and acceptance.     Plan Discussed with:   Anesthesia Plan Comments:         Anesthesia Quick Evaluation

## 2013-07-23 NOTE — Anesthesia Procedure Notes (Signed)
Procedure Name: Intubation Date/Time: 07/23/2013 12:38 PM Performed by: Andree Elk, AMY A Pre-anesthesia Checklist: Patient identified, Patient being monitored, Timeout performed, Emergency Drugs available and Suction available Patient Re-evaluated:Patient Re-evaluated prior to inductionOxygen Delivery Method: Circle System Utilized Preoxygenation: Pre-oxygenation with 100% oxygen Intubation Type: IV induction, Rapid sequence and Cricoid Pressure applied Laryngoscope Size: 3 and Miller Grade View: Grade I Tube type: Oral Tube size: 7.0 mm Number of attempts: 1 Airway Equipment and Method: stylet Placement Confirmation: ETT inserted through vocal cords under direct vision,  positive ETCO2 and breath sounds checked- equal and bilateral Secured at: 21 cm Tube secured with: Tape Dental Injury: Teeth and Oropharynx as per pre-operative assessment

## 2013-07-23 NOTE — Discharge Instructions (Signed)
Ventral Hernia A ventral hernia (also called an incisional hernia) is a hernia that occurs at the site of a previous surgical cut (incision) in the abdomen. The abdominal wall spans from your lower chest down to your pelvis. If the abdominal wall is weakened from a surgical incision, a hernia can occur. A hernia is a bulge of bowel or muscle tissue pushing out on the weakened part of the abdominal wall. Ventral hernias can get bigger from straining or lifting. Obese and older people are at higher risk for a ventral hernia. People who develop infections after surgery or require repeat incisions at the same site on the abdomen are also at increased risk. CAUSES  A ventral hernia occurs because of weakness in the abdominal wall at an incision site.  SYMPTOMS  Common symptoms include:  A visible bulge or lump on the abdominal wall.  Pain or tenderness around the lump.  Increased discomfort if you cough or make a sudden movement. If the hernia has blocked part of the intestine, a serious complication can occur (incarcerated or strangulated hernia). This can become a problem that requires emergency surgery because the blood flow to the blocked intestine may be cut off. Symptoms may include:  Feeling sick to your stomach (nauseous).  Throwing up (vomiting).  Stomach swelling (distention) or bloating.  Fever.  Rapid heartbeat. DIAGNOSIS  Your caregiver will take a medical history and perform a physical exam. Various tests may be ordered, such as:  Blood tests.  Urine tests.  Ultrasonography.  X-rays.  Computed tomography (CT). TREATMENT  Watchful waiting may be all that is needed for a smaller hernia that does not cause symptoms. Your caregiver may recommend the use of a supportive belt (truss) that helps to keep the abdominal wall intact. For larger hernias or those that cause pain, surgery to repair the hernia is usually recommended. If a hernia becomes strangulated, emergency surgery  needs to be done right away. HOME CARE INSTRUCTIONS  Avoid putting pressure or strain on the abdominal area.  Avoid heavy lifting.  Use good body positioning for physical tasks. Ask your caregiver about proper body positioning.  Use a supportive belt as directed by your caregiver.  Maintain a healthy weight.  Eat foods that are high in fiber, such as whole grains, fruits, and vegetables. Fiber helps prevent difficult bowel movements (constipation).  Drink enough fluids to keep your urine clear or pale yellow.  Follow up with your caregiver as directed. SEEK MEDICAL CARE IF:   Your hernia seems to be getting larger or more painful. SEEK IMMEDIATE MEDICAL CARE IF:   You have abdominal pain that is sudden and sharp.  Your pain becomes severe.  You have repeated vomiting.  You are sweating a lot.  You notice a rapid heartbeat.  You develop a fever. MAKE SURE YOU:   Understand these instructions.  Will watch your condition.  Will get help right away if you are not doing well or get worse. Document Released: 06/17/2012 Document Reviewed: 06/17/2012 [employer] Patient Information 2014 [employer].  

## 2013-07-23 NOTE — Transfer of Care (Signed)
Immediate Anesthesia Transfer of Care Note  Patient: [name]  Procedure(s) Performed: Procedure(s): HERNIA REPAIR INCISIONAL WITH MESH (N/A) INSERTION OF MESH (N/A)  Patient Location: PACU  Anesthesia Type:General  Level of Consciousness: sedated and patient cooperative  Airway & Oxygen Therapy: Patient Spontanous Breathing and Patient connected to face mask oxygen  Post-op Assessment: Report given to PACU RN and Post -op Vital signs reviewed and stable  Post vital signs: Reviewed and stable  Complications: No apparent anesthesia complications

## 2013-07-23 NOTE — Op Note (Signed)
Patient:  [name]  DOB:  Jan 05, 1966  MRN:  [id number]   Preop Diagnosis:  Recurrent incisional hernia  Postop Diagnosis:  Same  Procedure:  Recurrent incisional herniorrhaphy with mesh  Surgeon:  [name], M.D.  Anes:  General endotracheal  Indications:  Patient is a 48 year old white male who has had several attempts at repair of an epigastric ventral hernia now presents for treatment for recurrent incisional herniorrhaphy with mesh. The risks and benefits of the procedure including bleeding, infection, mesh placement, and recurrence of the hernia were fully explained to the patient, who gave informed consent.  Procedure note:  Patient is placed the supine position. After induction of general endotracheal anesthesia, the abdomen was prepped and draped using usual sterile technique with DuraPrep. Surgical site confirmation was performed.  A previous surgical scar in the epigastric region along the midline was excised at difficulty. It appeared that the patient had a fitness hernia sac, whereas the actual fascial defect was farther down. Part of the hernia sac was excised without difficulty. The fascial edges in this region up to the xiphoid process was freed away from the underlying omentum. Next, a 15 x 20 cm ventral light ST mesh was then inserted and secured in a parachute like fashion to the anterior abdominal wall using 0 Prolene interrupted sutures. Significant overlap circumferentially was done in order to prevent recurrence. The mesh was tacked to either side of the xiphoid process. The overlying soft tissue there was then reapproximated over the mesh using 2-0 Vicryl interrupted sutures. Skin was closed using staples.  Exparel was instilled in the surrounding wound. Betadine ointment and dry sterile dressings were applied.  All tape and needle counts were correct at the end of the procedure. The patient was extubated in the operating room and transferred to PACU in  stable condition.  Complications:  None  EBL:  Minimal  Specimen:  None

## 2013-07-26 ENCOUNTER — Encounter (HOSPITAL_COMMUNITY): Payer: Self-pay | Admitting: General Surgery

## 2013-10-31 ENCOUNTER — Encounter (HOSPITAL_COMMUNITY): Payer: Self-pay | Admitting: *Deleted

## 2013-10-31 ENCOUNTER — Inpatient Hospital Stay (HOSPITAL_COMMUNITY)
Admission: AD | Admit: 2013-10-31 | Discharge: 2013-11-02 | DRG: 282 | Disposition: A | Discharge status: Home / Self Care | Payer: Medicare Other | Source: Other Acute Inpatient Hospital | Attending: Cardiology | Admitting: Cardiology

## 2013-10-31 DIAGNOSIS — I214 Non-ST elevation (NSTEMI) myocardial infarction: Secondary | ICD-10-CM | POA: Diagnosis not present

## 2013-10-31 DIAGNOSIS — I959 Hypotension, unspecified: Secondary | ICD-10-CM | POA: Diagnosis not present

## 2013-10-31 DIAGNOSIS — Z951 Presence of aortocoronary bypass graft: Secondary | ICD-10-CM | POA: Diagnosis not present

## 2013-10-31 DIAGNOSIS — E669 Obesity, unspecified: Secondary | ICD-10-CM | POA: Diagnosis present

## 2013-10-31 DIAGNOSIS — I10 Essential (primary) hypertension: Secondary | ICD-10-CM | POA: Diagnosis not present

## 2013-10-31 DIAGNOSIS — E119 Type 2 diabetes mellitus without complications: Secondary | ICD-10-CM | POA: Diagnosis not present

## 2013-10-31 DIAGNOSIS — M129 Arthropathy, unspecified: Secondary | ICD-10-CM | POA: Diagnosis present

## 2013-10-31 DIAGNOSIS — M79609 Pain in unspecified limb: Secondary | ICD-10-CM | POA: Diagnosis present

## 2013-10-31 DIAGNOSIS — Z8673 Personal history of transient ischemic attack (TIA), and cerebral infarction without residual deficits: Secondary | ICD-10-CM

## 2013-10-31 DIAGNOSIS — G894 Chronic pain syndrome: Secondary | ICD-10-CM | POA: Diagnosis present

## 2013-10-31 DIAGNOSIS — J449 Chronic obstructive pulmonary disease, unspecified: Secondary | ICD-10-CM | POA: Diagnosis present

## 2013-10-31 DIAGNOSIS — Z79899 Other long term (current) drug therapy: Secondary | ICD-10-CM | POA: Diagnosis not present

## 2013-10-31 DIAGNOSIS — I2582 Chronic total occlusion of coronary artery: Secondary | ICD-10-CM | POA: Diagnosis not present

## 2013-10-31 DIAGNOSIS — F3289 Other specified depressive episodes: Secondary | ICD-10-CM | POA: Diagnosis present

## 2013-10-31 DIAGNOSIS — F329 Major depressive disorder, single episode, unspecified: Secondary | ICD-10-CM | POA: Diagnosis present

## 2013-10-31 DIAGNOSIS — Z7982 Long term (current) use of aspirin: Secondary | ICD-10-CM | POA: Diagnosis not present

## 2013-10-31 DIAGNOSIS — Z886 Allergy status to analgesic agent status: Secondary | ICD-10-CM

## 2013-10-31 DIAGNOSIS — G4730 Sleep apnea, unspecified: Secondary | ICD-10-CM | POA: Insufficient documentation

## 2013-10-31 DIAGNOSIS — Z9119 Patient's noncompliance with other medical treatment and regimen: Secondary | ICD-10-CM

## 2013-10-31 DIAGNOSIS — I25810 Atherosclerosis of coronary artery bypass graft(s) without angina pectoris: Secondary | ICD-10-CM | POA: Diagnosis not present

## 2013-10-31 DIAGNOSIS — Z91199 Patient's noncompliance with other medical treatment and regimen due to unspecified reason: Secondary | ICD-10-CM | POA: Diagnosis not present

## 2013-10-31 DIAGNOSIS — R079 Chest pain, unspecified: Secondary | ICD-10-CM | POA: Diagnosis not present

## 2013-10-31 DIAGNOSIS — G8929 Other chronic pain: Secondary | ICD-10-CM | POA: Diagnosis present

## 2013-10-31 DIAGNOSIS — G5790 Unspecified mononeuropathy of unspecified lower limb: Secondary | ICD-10-CM | POA: Diagnosis present

## 2013-10-31 DIAGNOSIS — F419 Anxiety disorder, unspecified: Secondary | ICD-10-CM | POA: Insufficient documentation

## 2013-10-31 DIAGNOSIS — R0602 Shortness of breath: Secondary | ICD-10-CM | POA: Diagnosis not present

## 2013-10-31 DIAGNOSIS — E782 Mixed hyperlipidemia: Secondary | ICD-10-CM | POA: Diagnosis present

## 2013-10-31 DIAGNOSIS — F411 Generalized anxiety disorder: Secondary | ICD-10-CM | POA: Diagnosis present

## 2013-10-31 DIAGNOSIS — S98139A Complete traumatic amputation of one unspecified lesser toe, initial encounter: Secondary | ICD-10-CM | POA: Diagnosis not present

## 2013-10-31 DIAGNOSIS — Z6837 Body mass index (BMI) 37.0-37.9, adult: Secondary | ICD-10-CM

## 2013-10-31 DIAGNOSIS — G4733 Obstructive sleep apnea (adult) (pediatric): Secondary | ICD-10-CM | POA: Diagnosis present

## 2013-10-31 DIAGNOSIS — F17200 Nicotine dependence, unspecified, uncomplicated: Secondary | ICD-10-CM | POA: Diagnosis present

## 2013-10-31 DIAGNOSIS — Z9884 Bariatric surgery status: Secondary | ICD-10-CM | POA: Diagnosis not present

## 2013-10-31 DIAGNOSIS — J4489 Other specified chronic obstructive pulmonary disease: Secondary | ICD-10-CM | POA: Diagnosis present

## 2013-10-31 DIAGNOSIS — E785 Hyperlipidemia, unspecified: Secondary | ICD-10-CM

## 2013-10-31 DIAGNOSIS — I252 Old myocardial infarction: Secondary | ICD-10-CM | POA: Diagnosis not present

## 2013-10-31 DIAGNOSIS — I2510 Atherosclerotic heart disease of native coronary artery without angina pectoris: Secondary | ICD-10-CM | POA: Diagnosis not present

## 2013-10-31 DIAGNOSIS — Z7902 Long term (current) use of antithrombotics/antiplatelets: Secondary | ICD-10-CM | POA: Diagnosis not present

## 2013-10-31 DIAGNOSIS — R0789 Other chest pain: Secondary | ICD-10-CM | POA: Diagnosis not present

## 2013-10-31 DIAGNOSIS — Z8249 Family history of ischemic heart disease and other diseases of the circulatory system: Secondary | ICD-10-CM

## 2013-10-31 DIAGNOSIS — Z91041 Radiographic dye allergy status: Secondary | ICD-10-CM | POA: Diagnosis not present

## 2013-10-31 HISTORY — DX: Personal history of transient ischemic attack (TIA), and cerebral infarction without residual deficits: Z86.73

## 2013-10-31 LAB — TSH: TSH: 1.5 u[IU]/mL (ref 0.350–4.500)

## 2013-10-31 LAB — COMPREHENSIVE METABOLIC PANEL
ALT: 41 U/L (ref 0–53)
AST: 53 U/L — ABNORMAL HIGH (ref 0–37)
Albumin: 3.5 g/dL (ref 3.5–5.2)
Alkaline Phosphatase: 49 U/L (ref 39–117)
BUN: 17 mg/dL (ref 6–23)
CO2: 23 mEq/L (ref 19–32)
Calcium: 9.1 mg/dL (ref 8.4–10.5)
Chloride: 104 mEq/L (ref 96–112)
Creatinine, Ser: 1.11 mg/dL (ref 0.50–1.35)
GFR calc Af Amer: >90 mL/min (ref >90)
GFR calc non Af Amer: 79 mL/min — ABNORMAL LOW (ref >90)
Glucose, Bld: 105 mg/dL — ABNORMAL HIGH (ref 70–99)
Potassium: 3.9 mEq/L (ref 3.7–5.3)
Sodium: 141 mEq/L (ref 137–147)
Total Bilirubin: 0.6 mg/dL (ref 0.3–1.2)
Total Protein: 6.7 g/dL (ref 6.0–8.3)

## 2013-10-31 LAB — PROTIME-INR
INR: 1.05 (ref 0.00–1.49)
Prothrombin Time: 13.5 seconds (ref 11.6–15.2)

## 2013-10-31 LAB — TROPONIN I: Troponin I: 1.08 ng/mL (ref <0.30)

## 2013-10-31 LAB — APTT: aPTT: 36 seconds (ref 24–37)

## 2013-10-31 LAB — GLUCOSE, CAPILLARY: Glucose-Capillary: 89 mg/dL (ref 70–99)

## 2013-10-31 MED ORDER — ASPIRIN EC 325 MG PO TBEC
325.0000 mg | DELAYED_RELEASE_TABLET | Freq: Every day | ORAL | Status: DC
  Administered 2013-10-31 – 2013-11-02 (×2): 325 mg via ORAL
  Filled 2013-10-31 (×3): qty 1

## 2013-10-31 MED ORDER — NITROGLYCERIN IN D5W 200-5 MCG/ML-% IV SOLN
2.0000 ug/min | INTRAVENOUS | Status: DC
  Administered 2013-10-31: 30 ug/min via INTRAVENOUS

## 2013-10-31 MED ORDER — ATORVASTATIN CALCIUM 80 MG PO TABS
80.0000 mg | ORAL_TABLET | Freq: Every day | ORAL | Status: DC
  Administered 2013-10-31 – 2013-11-01 (×2): 80 mg via ORAL
  Filled 2013-10-31 (×4): qty 1

## 2013-10-31 MED ORDER — HYDROCHLOROTHIAZIDE 25 MG PO TABS
25.0000 mg | ORAL_TABLET | Freq: Every day | ORAL | Status: DC
  Administered 2013-10-31: 25 mg via ORAL
  Filled 2013-10-31 (×2): qty 1

## 2013-10-31 MED ORDER — NITROGLYCERIN 0.4 MG SL SUBL: 0.4000 mg | SUBLINGUAL_TABLET | SUBLINGUAL | Status: DC | PRN

## 2013-10-31 MED ORDER — LOSARTAN POTASSIUM-HCTZ 100-25 MG PO TABS: 1.0000 | ORAL_TABLET | Freq: Every day | ORAL | Status: DC

## 2013-10-31 MED ORDER — TRAMADOL HCL 50 MG PO TABS: 50.0000 mg | ORAL_TABLET | Freq: Four times a day (QID) | ORAL | Status: DC | PRN

## 2013-10-31 MED ORDER — MORPHINE SULFATE 2 MG/ML IJ SOLN: 2.0000 mg | INTRAMUSCULAR | Status: DC | PRN

## 2013-10-31 MED ORDER — CARVEDILOL 25 MG PO TABS
25.0000 mg | ORAL_TABLET | Freq: Two times a day (BID) | ORAL | Status: DC
  Administered 2013-10-31: 25 mg via ORAL
  Filled 2013-10-31 (×4): qty 1

## 2013-10-31 MED ORDER — SODIUM CHLORIDE 0.9 % IV SOLN: 250.0000 mL | INTRAVENOUS | Status: DC | PRN

## 2013-10-31 MED ORDER — LOSARTAN POTASSIUM 50 MG PO TABS
100.0000 mg | ORAL_TABLET | Freq: Every day | ORAL | Status: DC
  Administered 2013-10-31: 100 mg via ORAL
  Filled 2013-10-31 (×2): qty 2

## 2013-10-31 MED ORDER — ALPRAZOLAM 0.5 MG PO TABS: 1.0000 mg | ORAL_TABLET | Freq: Four times a day (QID) | ORAL | Status: DC | PRN

## 2013-10-31 MED ORDER — HEPARIN (PORCINE) IN NACL 100-0.45 UNIT/ML-% IJ SOLN
1900.0000 [IU]/h | INTRAMUSCULAR | Status: DC
  Administered 2013-10-31: 2000 [IU]/h via INTRAVENOUS
  Administered 2013-11-01: 1900 [IU]/h via INTRAVENOUS
  Filled 2013-10-31 (×5): qty 250

## 2013-10-31 MED ORDER — CARVEDILOL 25 MG PO TABS: 25.0000 mg | ORAL_TABLET | Freq: Two times a day (BID) | ORAL | Status: DC

## 2013-10-31 MED ORDER — SODIUM CHLORIDE 0.9 % IJ SOLN
3.0000 mL | Freq: Two times a day (BID) | INTRAMUSCULAR | Status: DC
Start: 2013-10-31 — End: 2013-11-01
  Administered 2013-10-31 – 2013-11-01 (×2): 3 mL via INTRAVENOUS

## 2013-10-31 MED ORDER — SODIUM CHLORIDE 0.9 % IJ SOLN: 3.0000 mL | INTRAMUSCULAR | Status: DC | PRN

## 2013-10-31 NOTE — H&P (Addendum)
History and Physical   Admit date: 10/31/2013 Name:  [name] Medical record number: [id number] DOB/Age:  1966-02-17  48 y.o. male  Referring Physician:   [facility]  Primary Cardiologist: Dr. Rozann Lesches  Primary Physician:   Dr. Patrica Duel  Chief complaint/reason for admission: Chest pain  HPI:  This 48 year old male has a complicated medical history with multiple evaluations for chest pain showing nonobstructive coronary artery disease beginning in the 1990s but ultimately having an LAD stent with a Cypher stent in 2003. He later developed stent thrombosis and had to be reopened. He had multiple repeat catheterizations and later had a severe anaphylactic reaction to contrast despite premedication and eventually had to have allergy testing done at [facility] which found that he could tolerate Isovue. He was able to have a repeat catheterization done under general anesthesia in 2010 that showed three-vessel disease and the decision was made for him to go to bypass surgery.  He had bypass grafting by Dr. Cyndia Bent with LIMA to the LAD, vein to the diagonal, vein to OM1-OM 2, vein to the right coronary artery area and Dr. 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 He has developed ventral hernia since had multiple repairs with mesh and also had a stroke in July 2013 where he was hospitalized at [facility]  He stopped taking most of his medicines about 2 months ago because he was tired of it. He continues to smoke cigarettes about 1-1/2 packs per day. He had the onset of chest discomfort yesterday that he took nitroglycerin for that resulted in some diminution of it but never went away and had recurrent pain at 3:00 this morning and later on today and went to the [facility] emergency room. His troponin was 0.06 but EKG was  nonacute. He was transferred here for further treatment. He continues to complain of ongoing chest pain 5/10. The only medicine he has taken recently was losartan/HCTZ. He states that he stopped taking his medications because he was tired of feeling bad on them. He is disabled since age 31. He has chronic abdominal pain has had multiple repeat surgeries. He has obstructive sleep apnea but is not currently wearing CPAP. He remains obese.  Past Medical History  Diagnosis Date  . Mixed hyperlipidemia   . Essential hypertension, benign   . COPD (chronic obstructive pulmonary disease)   . Depression   . Anaphylaxis     IGE mediated  . Myocardial infarction 2010  . Anxiety   . Arthritis   . Diabetes mellitus without complication     diet controlled  . OSA (obstructive sleep apnea)     over 1 year ago suopped using cpap-discomfort at night  . History of CVA (cerebrovascular accident) 01/2012    right posterior frontal cortical and subcortical brain by MRI, no hemorrhage. Carotid Dopplers showed only 1-50% bilateral ICA stenoses. Echocardiogram showed LVEF 50%, no major valvular abnormalities.    coronary artery disease   Past Surgical History  Procedure Laterality Date  . Gastric bypass  2010  . Hernia repair  2011, 2012  . Toe amputation  1998    right 1st and 2nd toe  . Tonsilectomy, adenoidectomy, bilateral myringotomy and tubes    . Dental surgery  2003  . Cholecystectomy    . Coronary artery bypass graft  2010    LIMA to  LAD, SVG to diagonal, SVG to OM1 and OM 2, SVG to RCA  . Tonsillectomy    . Ventral hernia repair N/A 10/28/2012    Procedure: LAPAROSCOPIC VENTRAL HERNIA;  Surgeon: Donato Heinz, MD;  Location: [facility];  Service: General;  Laterality: N/A;  . Coronary angioplasty  2008  . Incisional hernia repair N/A 07/23/2013    Procedure: HERNIA REPAIR INCISIONAL WITH MESH;  Surgeon: Jamesetta So, MD;  Location: [facility];  Service: General;  Laterality: N/A;  . Insertion of  mesh N/A 07/23/2013    Procedure: INSERTION OF MESH;  Surgeon: Jamesetta So, MD;  Location: [facility];  Service: General;  Laterality: N/A;  .  Allergies: is allergic to contrast media; ibuprofen; and nsaids.   Medications: Prior to Admission medications   Medication Sig Start Date End Date Taking? Authorizing Provider  acetaminophen (TYLENOL) 500 MG tablet Take 500 mg by mouth every 6 (six) hours as needed for mild pain, moderate pain or headache.   Yes Historical Provider, MD  albuterol (PROVENTIL HFA;VENTOLIN HFA) 108 (90 BASE) MCG/ACT inhaler Inhale 2 puffs into the lungs every 6 (six) hours as needed for wheezing or shortness of breath.   Yes Historical Provider, MD  ALPRAZolam Duanne Moron) 1 MG tablet Take 1 mg by mouth 4 (four) times daily as needed for anxiety.    Yes Historical Provider, MD  nitroGLYCERIN (NITROSTAT) 0.4 MG SL tablet Place 0.4 mg under the tongue every 5 (five) minutes as needed. For chest pain   Yes Historical Provider, MD  UNKNOWN TO PATIENT Apply 1 application topically daily as needed (for psoriasis).   Yes Historical Provider, MD  amLODipine (NORVASC) 5 MG tablet Take 5 mg by mouth daily.    Historical Provider, MD  aspirin EC 325 MG tablet Take 325 mg by mouth daily.    Historical Provider, MD  carvedilol (COREG) 25 MG tablet Take 25 mg by mouth 2 (two) times daily with a meal.     Historical Provider, MD  citalopram (CELEXA) 40 MG tablet Take 20 mg by mouth daily.     Historical Provider, MD  cloNIDine (CATAPRES) 0.1 MG tablet Take 0.1 mg by mouth daily as needed (*Only taken for blood pressure levels that are >170/100*).    Historical Provider, MD  diltiazem (CARDIZEM CD) 240 MG 24 hr capsule Take 240 mg by mouth daily.    Historical Provider, MD  isosorbide mononitrate (IMDUR) 60 MG 24 hr tablet Take 60 mg by mouth 2 (two) times daily.     Historical Provider, MD  losartan-hydrochlorothiazide (HYZAAR) 100-25 MG per tablet Take 1 tablet by mouth daily.    Historical  Provider, MD  rosuvastatin (CRESTOR) 40 MG tablet Take 40 mg by mouth daily.    Historical Provider, MD  traMADol (ULTRAM) 50 MG tablet Take 50 mg by mouth every 6 (six) hours as needed. For pain    Historical Provider, MD    Family History:  Family Status  Relation Status Death Age  . Father Deceased 70    died of MI  . Mother Deceased 52     history of CABG, died of pneumonia  . Brother Alive     CAD  . Sister Alive   . Sister Alive   . Sister Alive   . Sister Alive     Social History:   reports that he has been smoking Cigarettes.  He has a 30 pack-year smoking history. He has never used smokeless tobacco. He reports  that he drinks alcohol. He reports that he does not use illicit drugs.   History   Social History Narrative   Disabled since age 50 lives with wife and children     Review of Systems: He has moderate shortness of breath. He has had significant hypertension previously. He has had significant issues with depression and anxiety and has had a previous stroke in the past. He complains of peripheral neuropathy and chronic pain involving his lower legs. He has abdominal pain since his surgery and is again been complaining recently of some left upper quadrant abdominal pain. Mild urinary frequency. Other than as noted above, the remainder of the review of systems is normal  Physical Exam: BP 158/80  Pulse 75  Temp(Src) 98.1 F (36.7 C) (Oral)  Resp 18  Ht 5\' 11"  (1.803 m)  Wt 121.4 kg (267 lb 10.2 oz)  BMI 37.34 kg/m2  SpO2 100% General appearance: Obese white male currently in no acute distress Head: Normocephalic, without obvious abnormality, atraumatic Eyes: conjunctivae/corneas clear. PERRL, EOM's intact. Fundi not examined  Neck: no adenopathy, no carotid bruit, no JVD and supple, symmetrical, trachea midline Lungs: clear to auscultation bilaterally, healed median sternotomy scar Heart: regular rate and rhythm, S1, S2 normal, no murmur, click, rub or  gallop Abdomen: Multiple surgical scars, tenderness in the left upper quadrant Rectal: deferred Extremities: Amputation of the first 2 toes on the right foot, no edema Pulses: 2+ and symmetric Skin: Skin color, texture, turgor normal. No rashes or lesions Neurologic: Grossly normal   Labs: Pending at the time of dictation. Outside troponin was 0.06     EKG: Possible old inferior infarction, nonspecific ST and T-wave changes  Radiology: Report from outside hospital no congestive heart failure   IMPRESSIONS: 1. Prolonged chest pain with elevation of troponin outside hospital consistent with non-ST elevation cardial infarction 2. Coronary artery disease with previous three-vessel disease with bypass grafting in 2010 3. History of severe anaphylaxis to contrast even despite premedication (Isovue only agent that can be used) 4. Medical noncompliance with treatment 5. Hypertension 6. Ongoing tobacco abuse 7. Hyperlipidemia 8. Obesity 9. Sleep apnea untreated 10. Chronic pain syndrome 11. History of stroke  PLAN: Admit to step down unit. Intravenous heparin, nitroglycerin, morphine as needed for pain. Unless he was to have objective evidence of a ST elevation myocardial nfarction in view of his previous anaphylactic history and Dr. Vivi Martens operative note stating that he was not a good candidate for repeat surgery would not be quick to go to the catheterization laboratory. He has been medically noncompliant with a number of his medications and has only taken one medicine in the past 2 months.  Signed: Kerry Hough MD [facility] Cardiology  10/31/2013, 7:09 PM

## 2013-10-31 NOTE — Progress Notes (Signed)
ANTICOAGULATION CONSULT NOTE - Initial Consult  Pharmacy Consult for Heparin Indication: Chest Pain - ACS  Allergies  Allergen Reactions  . Contrast Media [Iodinated Diagnostic Agents] Anaphylaxis, Shortness Of Breath, Swelling and Rash  . Ibuprofen Anaphylaxis, Hives and Swelling  . Nsaids Anaphylaxis, Hives, Swelling and Rash   Patient Measurements: Height: 5\' 11"  (180.3 cm) Weight: 267 lb 10.2 oz (121.4 kg) IBW/kg (Calculated) : 75.3 Heparin Dosing Weight: 96 kg  Vital Signs: Temp: 98.1 F (36.7 C) (04/19 1730) Temp src: Oral (04/19 1730) BP: 158/80 mmHg (04/19 1830) Pulse Rate: 75 (04/19 1830)  Labs: No results found for this basename: HGB, HCT, PLT, APTT, LABPROT, INR, HEPARINUNFRC, CREATININE, CKTOTAL, CKMB, TROPONINI,  in the last 72 hours  Estimated Creatinine Clearance: 123.6 ml/min (by C-G formula based on Cr of 1).  Medical History: Past Medical History  Diagnosis Date  . Mixed hyperlipidemia   . Essential hypertension, benign   . COPD (chronic obstructive pulmonary disease)   . Depression   . Anaphylaxis     IGE mediated  . Myocardial infarction 2010  . Anxiety   . Arthritis   . Diabetes mellitus without complication     diet controlled  . OSA (obstructive sleep apnea)     over 1 year ago suopped using cpap-discomfort at night  . History of CVA (cerebrovascular accident) 01/2012    right posterior frontal cortical and subcortical brain by MRI, no hemorrhage. Carotid Dopplers showed only 1-50% bilateral ICA stenoses. Echocardiogram showed LVEF 50%, no major valvular abnormalities.   Medications:  Prescriptions prior to admission  Medication Sig Dispense Refill  . acetaminophen (TYLENOL) 500 MG tablet Take 500 mg by mouth every 6 (six) hours as needed for mild pain, moderate pain or headache.      . albuterol (PROVENTIL HFA;VENTOLIN HFA) 108 (90 BASE) MCG/ACT inhaler Inhale 2 puffs into the lungs every 6 (six) hours as needed for wheezing or shortness of  breath.      . ALPRAZolam (XANAX) 1 MG tablet Take 1 mg by mouth 4 (four) times daily as needed for anxiety.       . nitroGLYCERIN (NITROSTAT) 0.4 MG SL tablet Place 0.4 mg under the tongue every 5 (five) minutes as needed. For chest pain      . UNKNOWN TO PATIENT Apply 1 application topically daily as needed (for psoriasis).      [name] amLODipine (NORVASC) 5 MG tablet Take 5 mg by mouth daily.      [name] aspirin EC 325 MG tablet Take 325 mg by mouth daily.      . carvedilol (COREG) 25 MG tablet Take 25 mg by mouth 2 (two) times daily with a meal.       . citalopram (CELEXA) 40 MG tablet Take 20 mg by mouth daily.       . cloNIDine (CATAPRES) 0.1 MG tablet Take 0.1 mg by mouth daily as needed (*Only taken for blood pressure levels that are >170/100*).      [name] diltiazem (CARDIZEM CD) 240 MG 24 hr capsule Take 240 mg by mouth daily.      . isosorbide mononitrate (IMDUR) 60 MG 24 hr tablet Take 60 mg by mouth 2 (two) times daily.       [name] losartan-hydrochlorothiazide (HYZAAR) 100-25 MG per tablet Take 1 tablet by mouth daily.      . rosuvastatin (CRESTOR) 40 MG tablet Take 40 mg by mouth daily.      . traMADol (ULTRAM) 50 MG tablet Take 50  mg by mouth every 6 (six) hours as needed. For pain       [name]: 48yo male, admits to not taking maintenance medications for over a couple of months.  He is in now with c/o chest pain and is being worked up for possible MI.  He was given one dose of SQ Enoxaparin 110 mg at ~ 3PM today.  I spoke with him and he confirms this as well.  His labs from [facility] reveal a stable CBC and platelet count.  His baseline coag labs are wnl as well.  He has a normal renal function with an estimated crcl > 70ml/min.  Noted his allergies to contrast dye and potential complications for cardiac cath.  Goal of Therapy:  Heparin level 0.3-0.7 units/ml Monitor platelets by anticoagulation protocol: Yes   Plan:  1.  Will begin IV heparin without a bolus since he received a therapeutic  dose of SQ Lovenox.  Will start IV heparin at rate of 2000 units/hr. 2.  Check 8 hour heparin level and adjust. 3.  Monitor for s/s of bleeding 4.  Daily heparin level/CBC  Rober Minion, PharmD., MS Clinical Pharmacist Pager:  [phone] Thank you for allowing pharmacy to be part of this patients care team. 10/31/2013,7:33 PM

## 2013-11-01 ENCOUNTER — Encounter (HOSPITAL_COMMUNITY)
Admission: AD | Disposition: A | Discharge status: Home / Self Care | Payer: Self-pay | Source: Other Acute Inpatient Hospital | Attending: Cardiology

## 2013-11-01 DIAGNOSIS — Z9119 Patient's noncompliance with other medical treatment and regimen: Secondary | ICD-10-CM

## 2013-11-01 DIAGNOSIS — I2510 Atherosclerotic heart disease of native coronary artery without angina pectoris: Secondary | ICD-10-CM

## 2013-11-01 DIAGNOSIS — E119 Type 2 diabetes mellitus without complications: Secondary | ICD-10-CM | POA: Diagnosis not present

## 2013-11-01 DIAGNOSIS — I2582 Chronic total occlusion of coronary artery: Secondary | ICD-10-CM | POA: Diagnosis not present

## 2013-11-01 DIAGNOSIS — F17200 Nicotine dependence, unspecified, uncomplicated: Secondary | ICD-10-CM

## 2013-11-01 DIAGNOSIS — Z91199 Patient's noncompliance with other medical treatment and regimen due to unspecified reason: Secondary | ICD-10-CM

## 2013-11-01 DIAGNOSIS — I959 Hypotension, unspecified: Secondary | ICD-10-CM | POA: Diagnosis not present

## 2013-11-01 DIAGNOSIS — I214 Non-ST elevation (NSTEMI) myocardial infarction: Secondary | ICD-10-CM

## 2013-11-01 DIAGNOSIS — E669 Obesity, unspecified: Secondary | ICD-10-CM

## 2013-11-01 DIAGNOSIS — I10 Essential (primary) hypertension: Secondary | ICD-10-CM

## 2013-11-01 HISTORY — PX: LEFT HEART CATHETERIZATION WITH CORONARY/GRAFT ANGIOGRAM: SHX5450

## 2013-11-01 LAB — HEMOGLOBIN A1C
Hgb A1c MFr Bld: 6 % — ABNORMAL HIGH (ref <5.7)
Mean Plasma Glucose: 126 mg/dL — ABNORMAL HIGH (ref <117)

## 2013-11-01 LAB — CBC
HCT: 43.3 % (ref 39.0–52.0)
Hemoglobin: 14.4 g/dL (ref 13.0–17.0)
MCH: 29.9 pg (ref 26.0–34.0)
MCHC: 33.3 g/dL (ref 30.0–36.0)
MCV: 90 fL (ref 78.0–100.0)
Platelets: 198 10*3/uL (ref 150–400)
RBC: 4.81 MIL/uL (ref 4.22–5.81)
RDW: 14 % (ref 11.5–15.5)
WBC: 12.2 10*3/uL — ABNORMAL HIGH (ref 4.0–10.5)

## 2013-11-01 LAB — LIPID PANEL
Cholesterol: 156 mg/dL (ref 0–200)
HDL: 27 mg/dL — ABNORMAL LOW (ref >39)
LDL Cholesterol: 92 mg/dL (ref 0–99)
Total CHOL/HDL Ratio: 5.8 RATIO
Triglycerides: 185 mg/dL — ABNORMAL HIGH (ref <150)
VLDL: 37 mg/dL (ref 0–40)

## 2013-11-01 LAB — HEPARIN LEVEL (UNFRACTIONATED)
Heparin Unfractionated: 0.72 IU/mL — ABNORMAL HIGH (ref 0.30–0.70)
Heparin Unfractionated: 0.41 IU/mL (ref 0.30–0.70)

## 2013-11-01 LAB — TROPONIN I
Troponin I: 2.61 ng/mL (ref <0.30)
Troponin I: 4.4 ng/mL (ref <0.30)

## 2013-11-01 LAB — GLUCOSE, CAPILLARY: Glucose-Capillary: 125 mg/dL — ABNORMAL HIGH (ref 70–99)

## 2013-11-01 SURGERY — LEFT HEART CATHETERIZATION WITH CORONARY/GRAFT ANGIOGRAM
Anesthesia: LOCAL

## 2013-11-01 MED ORDER — FAMOTIDINE IN NACL 20-0.9 MG/50ML-% IV SOLN
20.0000 mg | INTRAVENOUS | Status: AC
  Administered 2013-11-01: 20 mg via INTRAVENOUS
  Filled 2013-11-01: qty 50

## 2013-11-01 MED ORDER — SODIUM CHLORIDE 0.9 % IV SOLN: 250.0000 mL | INTRAVENOUS | Status: DC | PRN

## 2013-11-01 MED ORDER — HYDROCHLOROTHIAZIDE 12.5 MG PO CAPS
12.5000 mg | ORAL_CAPSULE | Freq: Every day | ORAL | Status: DC
  Administered 2013-11-02: 12.5 mg via ORAL
  Filled 2013-11-01: qty 1

## 2013-11-01 MED ORDER — PREDNISONE 50 MG PO TABS
50.0000 mg | ORAL_TABLET | ORAL | Status: AC
  Administered 2013-11-01: 50 mg via ORAL
  Filled 2013-11-01: qty 1

## 2013-11-01 MED ORDER — MIDAZOLAM HCL 2 MG/2ML IJ SOLN
INTRAMUSCULAR | Status: AC
  Filled 2013-11-01: qty 2

## 2013-11-01 MED ORDER — DIPHENHYDRAMINE HCL 50 MG/ML IJ SOLN
50.0000 mg | INTRAMUSCULAR | Status: AC
  Administered 2013-11-01: 50 mg via INTRAVENOUS
  Filled 2013-11-01: qty 1

## 2013-11-01 MED ORDER — LOSARTAN POTASSIUM 25 MG PO TABS
25.0000 mg | ORAL_TABLET | Freq: Every day | ORAL | Status: DC
  Administered 2013-11-02: 25 mg via ORAL
  Filled 2013-11-01: qty 1

## 2013-11-01 MED ORDER — CLOPIDOGREL BISULFATE 75 MG PO TABS
75.0000 mg | ORAL_TABLET | Freq: Every day | ORAL | Status: DC
  Administered 2013-11-02: 10:00:00 75 mg via ORAL

## 2013-11-01 MED ORDER — VERAPAMIL HCL 2.5 MG/ML IV SOLN
INTRAVENOUS | Status: AC
  Filled 2013-11-01: qty 2

## 2013-11-01 MED ORDER — CARVEDILOL 12.5 MG PO TABS
12.5000 mg | ORAL_TABLET | Freq: Two times a day (BID) | ORAL | Status: DC
  Administered 2013-11-01 – 2013-11-02 (×3): 12.5 mg via ORAL
  Filled 2013-11-01 (×6): qty 1

## 2013-11-01 MED ORDER — FENTANYL CITRATE 0.05 MG/ML IJ SOLN
INTRAMUSCULAR | Status: AC
  Filled 2013-11-01: qty 2

## 2013-11-01 MED ORDER — SODIUM CHLORIDE 0.9 % IV SOLN: 1.0000 mL/kg/h | INTRAVENOUS | Status: AC

## 2013-11-01 MED ORDER — ISOSORBIDE MONONITRATE ER 60 MG PO TB24
60.0000 mg | ORAL_TABLET | Freq: Two times a day (BID) | ORAL | Status: DC
  Administered 2013-11-01 – 2013-11-02 (×2): 60 mg via ORAL
  Filled 2013-11-01 (×3): qty 1

## 2013-11-01 MED ORDER — HEPARIN SODIUM (PORCINE) 1000 UNIT/ML IJ SOLN
INTRAMUSCULAR | Status: AC
  Filled 2013-11-01: qty 1

## 2013-11-01 MED ORDER — HEART ATTACK BOUNCING BOOK
Freq: Once | Status: DC
  Filled 2013-11-01: qty 1

## 2013-11-01 MED ORDER — METHYLPREDNISOLONE SODIUM SUCC 125 MG IJ SOLR
125.0000 mg | INTRAMUSCULAR | Status: AC
  Administered 2013-11-01: 125 mg via INTRAVENOUS
  Filled 2013-11-01: qty 2

## 2013-11-01 MED ORDER — CLOPIDOGREL BISULFATE 75 MG PO TABS
300.0000 mg | ORAL_TABLET | Freq: Once | ORAL | Status: AC
  Administered 2013-11-01: 19:00:00 300 mg via ORAL
  Filled 2013-11-01: qty 4

## 2013-11-01 MED ORDER — AMLODIPINE BESYLATE 5 MG PO TABS
5.0000 mg | ORAL_TABLET | Freq: Every day | ORAL | Status: DC
  Administered 2013-11-01 – 2013-11-02 (×2): 5 mg via ORAL
  Filled 2013-11-01 (×2): qty 1

## 2013-11-01 MED ORDER — SODIUM CHLORIDE 0.9 % IJ SOLN: 3.0000 mL | Freq: Two times a day (BID) | INTRAMUSCULAR | Status: DC

## 2013-11-01 MED ORDER — SODIUM CHLORIDE 0.9 % IJ SOLN: 3.0000 mL | INTRAMUSCULAR | Status: DC | PRN

## 2013-11-01 MED FILL — Nitroglycerin IV Soln 200 MCG/ML in D5W: INTRAVENOUS | Qty: 250 | Status: AC

## 2013-11-01 NOTE — Progress Notes (Signed)
CRITICAL VALUE ALERT  Critical value received:  Trop 1.08 and 2.61  Date of notification:  10/31/13 and 11/01/13  Time of notification: 2120 and 0120  Critical value read back:yes  Nurse who received alert:  San Jetty RN  MD notified (1st page):  Dr. Wynonia Lawman   No new orders given at this time.

## 2013-11-01 NOTE — CV Procedure (Signed)
    Cardiac Catheterization Procedure Note  Name: [name] MRN: [id number] DOB: 1965-09-24  Procedure: Left Heart Cath, Selective Coronary Angiography, LIMA angiography, SVG angiography, LV angiography  Indication: NSTEMI. 48 year-old pleasant but noncompliant male who is s/p CABG, presenting with troponin positive ACS. He has a severe contrast allergy but we have been able to use ISOVUE in the past with success. He was appropriately premedicated for an iodine/contrast allergy.    Procedural Details: The left wrist was prepped, draped, and anesthetized with 1% lidocaine. Using the modified Seldinger technique, a 5 French sheath was introduced into the left radial artery. 3 mg of verapamil was administered through the sheath, weight-based unfractionated heparin was administered intravenously. Standard Judkins catheters were used for selective coronary angiography and left ventriculography. A LIMA catheter was used for LIMA angiography, SVG angiography. A 3DRC catheter was used for native RCA angiography. A JR4 catheter was used for angiography of the SVG-RCA. Catheter exchanges were performed over an exchange length guidewire. There were no immediate procedural complications. A TR band was used for radial hemostasis at the completion of the procedure.  The patient was transferred to the post catheterization recovery area for further monitoring.  Procedural Findings: Hemodynamics: AO 100/64 LV 104/20  Coronary angiography: Coronary dominance: right  Left mainstem: The left main is patent without obstructive disease   Left anterior descending (LAD): The LAD is totally occluded at the proximal portion.  Left circumflex (LCx): The left circumflex is severely diseased with 80-90% proximal vessel stenosis. The obtuse marginal branches are all occluded. The left PLA branches which are very small, are patent.  Right coronary artery (RCA): The native RCA is totally occluded.  LIMA to  LAD: Widely patent throughout. The native LAD is diffusely diseased but patent throughout its course. The vessel retrograde fills back through the first diagonal. The LAD wraps around the left ventricular apex without significant disease.  Saphenous vein graft to first diagonal: Widely patent without significant obstruction.  Saphenous vein graft sequential to OM1 and OM 2 is patent without significant disease. There are some areas of dilatation. The vein is particularly dilated between the OM1 and OM 2 sequence.  Saphenous vein graft to PDA: Patent throughout with no obstructive disease.  Left ventriculography: There is severe hypokinesis of the anterolateral and periapical walls. The basal anterior and basal and midinferior walls contract normally. The estimated LVEF is 35%.  Final Conclusions:   1. Severe native three-vessel coronary artery disease with total occlusion of the RCA, total occlusion of the LAD, and total occlusion of the obtuse marginal branch of the left circumflex  2. Status post aortocoronary bypass surgery with continued patency of the LIMA to LAD, saphenous vein graft to first diagonal, sequential saphenous vein graft to OM1 and OM 2, and saphenous vein graft to the right PDA  3. Severe segmental LV systolic dysfunction  Recommendations: All the patient's grafts are patent. I am not sure why he sustained a non-ST elevation infarction. His symptoms were highly typical of acute coronary syndrome. It is possible that he had a small vessel obstruction or plaque embolization. Recommend that he start Plavix. We'll give him 300 mg today and then 75 mg daily. He is now stable and chest pain-free. Will stop heparin and IV nitroglycerin and transfer him to a step down bed.  Sherren Mocha 11/01/2013, 4:53 PM

## 2013-11-01 NOTE — Interval H&P Note (Signed)
History and Physical Interval Note:  11/01/2013 4:02 PM  [name]  has presented today for surgery, with the diagnosis of cp  The various methods of treatment have been discussed with the patient and family. After consideration of risks, benefits and other options for treatment, the patient has consented to  Procedure(s): LEFT HEART CATHETERIZATION WITH CORONARY/GRAFT ANGIOGRAM (N/A) as a surgical intervention .  The patient's history has been reviewed, patient examined, no change in status, stable for surgery.  I have reviewed the patient's chart and labs.  Questions were answered to the patient's satisfaction.    Cath Lab Visit (complete for each Cath Lab visit)  Clinical Evaluation Leading to the Procedure:   ACS: yes  Non-ACS:    Anginal Classification: CCS IV  Anti-ischemic medical therapy: Maximal Therapy (2 or more classes of medications)  Non-Invasive Test Results: No non-invasive testing performed  Prior CABG: Previous CABG       Sherren Mocha

## 2013-11-01 NOTE — Progress Notes (Signed)
[place] for Heparin  Indication: chest pain/ACS  Allergies  Allergen Reactions  . Contrast Media [Iodinated Diagnostic Agents] Anaphylaxis, Shortness Of Breath, Swelling and Rash    Isovue contrast is most acceptable agent based on previous experience and testing  . Ibuprofen Anaphylaxis, Hives and Swelling  . Nsaids Anaphylaxis, Hives, Swelling and Rash    Patient Measurements: Height: 5\' 11"  (180.3 cm) Weight: 267 lb 10.2 oz (121.4 kg) IBW/kg (Calculated) : 75.3 Heparin Dosing Weight: 96 kg  Vital Signs: Temp: 98.4 F (36.9 C) (04/20 1112) Temp src: Oral (04/20 1112) BP: 106/50 mmHg (04/20 1112) Pulse Rate: 72 (04/20 0820)  Labs:  Recent Labs  10/31/13 2020 11/01/13 0045 11/01/13 0710 11/01/13 1435  HGB  --   --  14.4  --   HCT  --   --  43.3  --   PLT  --   --  198  --   APTT 36  --   --   --   LABPROT 13.5  --   --   --   INR 1.05  --   --   --   HEPARINUNFRC  --   --  0.72* 0.41  CREATININE 1.11  --   --   --   TROPONINI 1.08* 2.61* 4.40*  --     Estimated Creatinine Clearance: 111.4 ml/min (by C-G formula based on Cr of 1.11).   Medications:  Heparin 2000 units/hr  Assessment: 48 y/o M on heparin for CP. Repeat heparin level at goal. No bleeding issues noted. Patient is planned to have cath today. Dye allergy noted, have orders prophylactic medications and interventionalist to use Isovue which patient has tolerated in past.  Goal of Therapy:  Heparin level 0.3-0.7 units/ml Monitor platelets by anticoagulation protocol: Yes   Plan:  -Continue heparin at 2000 units/hr -Daily CBC/HL -Monitor for bleeding  Erin Hearing PharmD., BCPS Clinical Pharmacist Pager [phone] 11/01/2013 3:41 PM

## 2013-11-01 NOTE — Care Management Note (Signed)
    Page 1 of 1   11/01/2013     11:22:07 AM CARE MANAGEMENT NOTE 11/01/2013  Patient:  [name], [name]   Account Number:  [id number]  Date Initiated:  11/01/2013  Documentation initiated by:  Elissa Hefty  Subjective/Objective Assessment:   adm w  mi     Action/Plan:   lives w wife, pcp d jerry daniel   Anticipated DC Date:     Anticipated DC Plan:           Choice offered to / List presented to:             Status of service:   Medicare Important Message given?   (If response is "NO", the following Medicare IM given date fields will be blank) Date Medicare IM given:   Date Additional Medicare IM given:    Discharge Disposition:    Per UR Regulation:  Reviewed for med. necessity/level of care/duration of stay  If discussed at [place] of Stay Meetings, dates discussed:    Comments:

## 2013-11-01 NOTE — Progress Notes (Signed)
TR BAND REMOVAL  LOCATION:    left radial  DEFLATED PER PROTOCOL:    yes  TIME BAND OFF / DRESSING APPLIED:    2130  SITE UPON ARRIVAL:    Level 0  SITE AFTER BAND REMOVAL:    Level 0  REVERSE ALLEN'S TEST:     positive  CIRCULATION SENSATION AND MOVEMENT:    Within Normal Limits   yes  COMMENTS:   Written post radial cath instructions given and reviewed w/ pt, no questions voiced, eager to go home tomorrow.  Denies complaints.  Ate meal tray and subway sandwich brought by family, now snoring in chair, awakens to touch or loud voice.

## 2013-11-01 NOTE — Progress Notes (Signed)
ANTICOAGULATION CONSULT NOTE - Follow Up Consult  Pharmacy Consult for Heparin  Indication: chest pain/ACS  Allergies  Allergen Reactions  . Contrast Media [Iodinated Diagnostic Agents] Anaphylaxis, Shortness Of Breath, Swelling and Rash  . Ibuprofen Anaphylaxis, Hives and Swelling  . Nsaids Anaphylaxis, Hives, Swelling and Rash    Patient Measurements: Height: 5\' 11"  (180.3 cm) Weight: 267 lb 10.2 oz (121.4 kg) IBW/kg (Calculated) : 75.3 Heparin Dosing Weight: 96 kg  Vital Signs: Temp: 98.9 F (37.2 C) (04/20 0400) Temp src: Oral (04/20 0400) BP: 138/59 mmHg (04/20 0400) Pulse Rate: 78 (04/20 0400)  Labs:  Recent Labs  10/31/13 2020 11/01/13 0045 11/01/13 0710  HGB  --   --  14.4  HCT  --   --  43.3  PLT  --   --  198  APTT 36  --   --   LABPROT 13.5  --   --   INR 1.05  --   --   HEPARINUNFRC  --   --  0.72*  CREATININE 1.11  --   --   TROPONINI 1.08* 2.61* 4.40*    Estimated Creatinine Clearance: 111.4 ml/min (by C-G formula based on Cr of 1.11).   Medications:  Heparin 2000 units/hr  Assessment: 48 y/o M on heparin for CP. First HL is 0.72. Other labs as above.   Goal of Therapy:  Heparin level 0.3-0.7 units/ml Monitor platelets by anticoagulation protocol: Yes   Plan:  -Decrease heparin to 1900 units/hr -1430 HL -Daily CBC/HL -Monitor for bleeding  Narda Bonds 11/01/2013,8:22 AM

## 2013-11-01 NOTE — Progress Notes (Addendum)
Subjective:  No further chest pain. Sitting in chair with wife/nurse. On IV NTG, Hep gtt.   Objective:  Vital Signs in the last 24 hours: Temp:  [98.1 F (36.7 C)-98.9 F (37.2 C)] 98.6 F (37 C) (04/20 0820) Pulse Rate:  [71-81] 72 (04/20 0820) Resp:  [10-23] 17 (04/20 0820) BP: (102-170)/(37-83) 102/37 mmHg (04/20 0820) SpO2:  [96 %-100 %] 97 % (04/20 0820) Weight:  [267 lb 10.2 oz (121.4 kg)] 267 lb 10.2 oz (121.4 kg) (04/19 1730)  Intake/Output from previous day: 04/19 0701 - 04/20 0700 In: 282.2 [I.V.:282.2] Out: -    Physical Exam: General: Well developed, well nourished, in no acute distress. Head:  Normocephalic and atraumatic. No bruit, thick neck Lungs: Clear to auscultation and percussion with mild RLL posterior wheeze. Normal effort.  Heart: Normal S1 and S2.  No murmur, rubs or gallops. Prior CABG scar.  Abdomen: soft, non-tender, positive bowel sounds. Obese Extremities: No clubbing or cyanosis. No edema. Neurologic: Alert and oriented x 3. MAE x 4.     Lab Results:  Recent Labs  11/01/13 0710  WBC 12.2*  HGB 14.4  PLT 198    Recent Labs  10/31/13 2020  NA 141  K 3.9  CL 104  CO2 23  GLUCOSE 105*  BUN 17  CREATININE 1.11    Recent Labs  11/01/13 0045 11/01/13 0710  TROPONINI 2.61* 4.40*   Hepatic Function Panel  Recent Labs  10/31/13 2020  PROT 6.7  ALBUMIN 3.5  AST 53*  ALT 41  ALKPHOS 49  BILITOT 0.6    Recent Labs  11/01/13 0710  CHOL 156    Telemetry: NSR 60-70 Personally viewed.   EKG: NSR TWI laterally (seen on prior)  Cardiac Studies:  Cath 2010 prior to CABG  Coronary angiography left mainstem: The left main is patent. There is  minor narrowing at the distal left main but no significant stenosis  present. The left main trifurcates into the LAD and intermediate branch  in the left circumflex.  LAD: The LAD is a large-caliber vessel proximally, there is minor  nonobstructive disease just at the level of  the first diagonal. The LAD  is totally occluded proximal to the stented segment. The remaining  portions of the mid and distal LAD fill from collaterals provided by the  right coronary artery. The diagonal branch has a 50% - 70% ostial  stenosis and is a large branch vessel.  Left circumflex: There is a large intermediate branch with 40% - 50%  proximal stenosis.  The AV groove left circumflex has a 50% - 60% proximal stenosis, it then  gives off a first OM branch. It has a 50% - 70% stenosis before it  bifurcates into two vessels. The AV groove circumflex courses down and  supplies a posterolateral branch and has no significant stenosis.  Right coronary artery: The right coronary artery is diffusely diseased.  There is a long segment of 70% - 75% stenosis involving the proximal  vessel. The midvessel further down has a long 50% stenosis, distally it  supplies a PDA branch that has minor luminal irregularities. The PDA  fills the LAD system via septal perforators in the LAD, fills all the  way back to the proximal vessel.   ASSESSMENT:  1. Proximal left anterior descending occlusion with collaterals  provided by the right coronary artery.  2. Moderately severe right coronary artery and left circumflex  stenoses as described above.  3. Mild left ventricular  dysfunction consistent with prior apical  myocardial infarction.  Rec - CABG  Juanda Bond. Burt Knack, MD  ECHO 10/15/12: - Left ventricle: The cavity size was normal. There was mild concentric hypertrophy. Systolic function was mildly to moderately reduced. The estimated ejection fraction was in the range of 40% to 45%. There is akinesis of the mid-distalinferoseptal and apical myocardium. There is akinesis of the mid-distalinferior myocardium. Possible hypokinesis of the distalanterolateral myocardium. The study is not technically sufficient to allow evaluation of LV diastolic function. Images are limited, anterolateral wall not  well seen. Could consider nuclear medicine study or Definity contrast if assessment of LVEF is critical. Unable to compare with previous study from 2009. - Aortic valve: Trivial regurgitation. - Mitral valve: Trivial regurgitation. - Left atrium: The atrium was at the upper limits of normal in size. - Tricuspid valve: Trivial regurgitation. - Pulmonary arteries: Systolic pressure could not be accurately estimated. - Inferior vena cava: Not visualized. - Pericardium, extracardiac: There was no pericardial effusion.    Assessment/Plan:  Principal Problem:   Non-STEMI (non-ST elevated myocardial infarction) Active Problems:   Tobacco abuse   CAD (coronary artery disease)   History of noncompliance with medical treatment   Obesity (BMI 69-60.39)  48 year old with CAD s/p CABG 2010, history of pericarditis/adhesions, ongoing tobacco use, medical non compliance, IV dye allergy (hives and throat swelling), obese here with NSTEMI.   1) NSTEMI  - careful review of prior notes. Since Trop continues to elevate (4), will proceed with cath. Risks and benefits discussed including stroke, MI, death, bleeding, renal impairment.   - Dr. Cyndia Bent stated in prior note that due to adhesions, would not be a good redo CABG candidate.  - with severe IV dye allergy, will give prednisone 50mg  PO now and solumedrol IV on call to lab (cath this afternoon). IV pepcid now as well as IV H2 blocker now. Discussed personally with Dr. Burt Knack. Allergy testing at [facility] stated he could tolerate Isoview. That is what Dr. Burt Knack used last cath. Last cath was done with anesthesia present and intubation because of prior severe allergic reactions.   - With relative hypotension will decrease Coreg to 12.5 BID from 25 and losartan 25mg  instead of 100. Hold today. Got Coreg 25mg  last night. Has been non compliant. BP also low because of IV NTG.   2) CAD  - as above  - preach compliance.   - prior history includes LAD Cypher  stent 2003 with thrombosis.   3) Obesity  - wt loss  4) Tobacco use  - once again encouraged him to stop smoking. Puts him at risk for repeat MI as we are seeing now.   Discussed with he and wife at length.   Candee Furbish 11/01/2013, 9:49 AM

## 2013-11-01 NOTE — H&P (View-Only) (Signed)
Subjective:  No further chest pain. Sitting in chair with wife/nurse. On IV NTG, Hep gtt.   Objective:  Vital Signs in the last 24 hours: Temp:  [98.1 F (36.7 C)-98.9 F (37.2 C)] 98.6 F (37 C) (04/20 0820) Pulse Rate:  [71-81] 72 (04/20 0820) Resp:  [10-23] 17 (04/20 0820) BP: (102-170)/(37-83) 102/37 mmHg (04/20 0820) SpO2:  [96 %-100 %] 97 % (04/20 0820) Weight:  [267 lb 10.2 oz (121.4 kg)] 267 lb 10.2 oz (121.4 kg) (04/19 1730)  Intake/Output from previous day: 04/19 0701 - 04/20 0700 In: 282.2 [I.V.:282.2] Out: -    Physical Exam: General: Well developed, well nourished, in no acute distress. Head:  Normocephalic and atraumatic. No bruit, thick neck Lungs: Clear to auscultation and percussion with mild RLL posterior wheeze. Normal effort.  Heart: Normal S1 and S2.  No murmur, rubs or gallops. Prior CABG scar.  Abdomen: soft, non-tender, positive bowel sounds. Obese Extremities: No clubbing or cyanosis. No edema. Neurologic: Alert and oriented x 3. MAE x 4.     Lab Results:  Recent Labs  11/01/13 0710  WBC 12.2*  HGB 14.4  PLT 198    Recent Labs  10/31/13 2020  NA 141  K 3.9  CL 104  CO2 23  GLUCOSE 105*  BUN 17  CREATININE 1.11    Recent Labs  11/01/13 0045 11/01/13 0710  TROPONINI 2.61* 4.40*   Hepatic Function Panel  Recent Labs  10/31/13 2020  PROT 6.7  ALBUMIN 3.5  AST 53*  ALT 41  ALKPHOS 49  BILITOT 0.6    Recent Labs  11/01/13 0710  CHOL 156    Telemetry: NSR 60-70 Personally viewed.   EKG: NSR TWI laterally (seen on prior)  Cardiac Studies:  Cath 2010 prior to CABG  Coronary angiography left mainstem: The left main is patent. There is  minor narrowing at the distal left main but no significant stenosis  present. The left main trifurcates into the LAD and intermediate branch  in the left circumflex.  LAD: The LAD is a large-caliber vessel proximally, there is minor  nonobstructive disease just at the level of  the first diagonal. The LAD  is totally occluded proximal to the stented segment. The remaining  portions of the mid and distal LAD fill from collaterals provided by the  right coronary artery. The diagonal branch has a 50% - 70% ostial  stenosis and is a large branch vessel.  Left circumflex: There is a large intermediate branch with 40% - 50%  proximal stenosis.  The AV groove left circumflex has a 50% - 60% proximal stenosis, it then  gives off a first OM branch. It has a 50% - 70% stenosis before it  bifurcates into two vessels. The AV groove circumflex courses down and  supplies a posterolateral branch and has no significant stenosis.  Right coronary artery: The right coronary artery is diffusely diseased.  There is a long segment of 70% - 75% stenosis involving the proximal  vessel. The midvessel further down has a long 50% stenosis, distally it  supplies a PDA branch that has minor luminal irregularities. The PDA  fills the LAD system via septal perforators in the LAD, fills all the  way back to the proximal vessel.   ASSESSMENT:  1. Proximal left anterior descending occlusion with collaterals  provided by the right coronary artery.  2. Moderately severe right coronary artery and left circumflex  stenoses as described above.  3. Mild left ventricular  dysfunction consistent with prior apical  myocardial infarction.  Rec - CABG  Juanda Bond. Burt Knack, MD  ECHO 10/15/12: - Left ventricle: The cavity size was normal. There was mild concentric hypertrophy. Systolic function was mildly to moderately reduced. The estimated ejection fraction was in the range of 40% to 45%. There is akinesis of the mid-distalinferoseptal and apical myocardium. There is akinesis of the mid-distalinferior myocardium. Possible hypokinesis of the distalanterolateral myocardium. The study is not technically sufficient to allow evaluation of LV diastolic function. Images are limited, anterolateral wall not  well seen. Could consider nuclear medicine study or Definity contrast if assessment of LVEF is critical. Unable to compare with previous study from 2009. - Aortic valve: Trivial regurgitation. - Mitral valve: Trivial regurgitation. - Left atrium: The atrium was at the upper limits of normal in size. - Tricuspid valve: Trivial regurgitation. - Pulmonary arteries: Systolic pressure could not be accurately estimated. - Inferior vena cava: Not visualized. - Pericardium, extracardiac: There was no pericardial effusion.    Assessment/Plan:  Principal Problem:   Non-STEMI (non-ST elevated myocardial infarction) Active Problems:   Tobacco abuse   CAD (coronary artery disease)   History of noncompliance with medical treatment   Obesity (BMI 16-64.68)  48 year old with CAD s/p CABG 2010, history of pericarditis/adhesions, ongoing tobacco use, medical non compliance, IV dye allergy (hives and throat swelling), obese here with NSTEMI.   1) NSTEMI  - careful review of prior notes. Since Trop continues to elevate (4), will proceed with cath. Risks and benefits discussed including stroke, MI, death, bleeding, renal impairment.   - Dr. Cyndia Bent stated in prior note that due to adhesions, would not be a good redo CABG candidate.  - with severe IV dye allergy, will give prednisone 50mg  PO now and solumedrol IV on call to lab (cath this afternoon). IV pepcid now as well as IV H2 blocker now. Discussed personally with Dr. Burt Knack. Allergy testing at [facility] stated he could tolerate Isoview. That is what Dr. Burt Knack used last cath. Last cath was done with anesthesia present and intubation because of prior severe allergic reactions.   - With relative hypotension will decrease Coreg to 12.5 BID from 25 and losartan 25mg  instead of 100. Hold today. Got Coreg 25mg  last night. Has been non compliant. BP also low because of IV NTG.   2) CAD  - as above  - preach compliance.   - prior history includes LAD Cypher  stent 2003 with thrombosis.   3) Obesity  - wt loss  4) Tobacco use  - once again encouraged him to stop smoking. Puts him at risk for repeat MI as we are seeing now.   Discussed with he and wife at length.   Candee Furbish 11/01/2013, 9:49 AM

## 2013-11-02 DIAGNOSIS — I2510 Atherosclerotic heart disease of native coronary artery without angina pectoris: Secondary | ICD-10-CM | POA: Diagnosis not present

## 2013-11-02 DIAGNOSIS — I214 Non-ST elevation (NSTEMI) myocardial infarction: Secondary | ICD-10-CM | POA: Diagnosis not present

## 2013-11-02 DIAGNOSIS — I10 Essential (primary) hypertension: Secondary | ICD-10-CM | POA: Diagnosis not present

## 2013-11-02 LAB — CBC
HCT: 44.6 % (ref 39.0–52.0)
Hemoglobin: 15.3 g/dL (ref 13.0–17.0)
MCH: 30.5 pg (ref 26.0–34.0)
MCHC: 34.3 g/dL (ref 30.0–36.0)
MCV: 89 fL (ref 78.0–100.0)
Platelets: 215 10*3/uL (ref 150–400)
RBC: 5.01 MIL/uL (ref 4.22–5.81)
RDW: 13.9 % (ref 11.5–15.5)
WBC: 18.4 10*3/uL — ABNORMAL HIGH (ref 4.0–10.5)

## 2013-11-02 LAB — BASIC METABOLIC PANEL
BUN: 20 mg/dL (ref 6–23)
CO2: 21 mEq/L (ref 19–32)
Calcium: 9.6 mg/dL (ref 8.4–10.5)
Chloride: 101 mEq/L (ref 96–112)
Creatinine, Ser: 1.04 mg/dL (ref 0.50–1.35)
GFR calc Af Amer: >90 mL/min (ref >90)
GFR calc non Af Amer: 85 mL/min — ABNORMAL LOW (ref >90)
Glucose, Bld: 178 mg/dL — ABNORMAL HIGH (ref 70–99)
Potassium: 4 mEq/L (ref 3.7–5.3)
Sodium: 140 mEq/L (ref 137–147)

## 2013-11-02 MED ORDER — CLOPIDOGREL BISULFATE 75 MG PO TABS: 75.0000 mg | ORAL_TABLET | Freq: Every day | ORAL | Status: DC

## 2013-11-02 MED ORDER — HYDROCHLOROTHIAZIDE 12.5 MG PO CAPS: 12.5000 mg | ORAL_CAPSULE | Freq: Every day | ORAL | Status: DC

## 2013-11-02 MED ORDER — LOSARTAN POTASSIUM 25 MG PO TABS: 25.0000 mg | ORAL_TABLET | Freq: Every day | ORAL | Status: DC

## 2013-11-02 MED ORDER — CARVEDILOL 12.5 MG PO TABS: 12.5000 mg | ORAL_TABLET | Freq: Two times a day (BID) | ORAL | Status: DC

## 2013-11-02 NOTE — Discharge Summary (Signed)
The patient has done well since cath/PCI with no recurrence of chest pain. Details of the discharge summary are accurate and were discussed in detail with [name].  The patient was interviewed and all data reviewed. The cath site is unremarkable.

## 2013-11-02 NOTE — Discharge Summary (Signed)
Physician Discharge Summary     Patient ID: [name] MRN: [id number] DOB/AGE: March 25, 1966 48 y.o.  Cardiologist:  Domenic Polite  Admit date: 10/31/2013 Discharge date: 11/02/2013  Admission Diagnoses:  NSTEMI  Discharge Diagnoses:  Principal Problem:   Non-STEMI (non-ST elevated myocardial infarction) Active Problems:   Tobacco abuse   CAD (coronary artery disease)   History of noncompliance with medical treatment   Obesity (BMI 30-39.9)   Discharged Condition: stable  Hospital Course:   48 year old male has a complicated medical history with multiple evaluations for chest pain showing nonobstructive coronary artery disease beginning in the 1990s but ultimately having an LAD stent with a Cypher stent in 2003. He later developed stent thrombosis and had to be reopened. He had multiple repeat catheterizations and later had a severe anaphylactic reaction to contrast despite premedication and eventually had to have allergy testing done at [facility] which found that he could tolerate Isovue. He was able to have a repeat catheterization done under general anesthesia in 2010 that showed three-vessel disease and the decision was made for him to go to bypass surgery.    He had bypass grafting by Dr. Cyndia Bent with LIMA to the LAD, vein to the diagonal, vein to OM1-OM 2, vein to the right coronary artery area and Dr. 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    He stopped taking most of his medicines about 2 months ago because he was tired of it. He continues to smoke cigarettes about 1-1/2 packs per day. He had the onset of chest discomfort yesterday that he took nitroglycerin for that resulted in  some diminution of it but never went away and had recurrent pain at 3:00 this morning and later on today and went to the [facility] emergency room. His troponin was 0.06 but EKG was nonacute. He was transferred here for further treatment. He continues to complain of ongoing chest pain 5/10. The only medicine he has taken recently was losartan/HCTZ. He states that he stopped taking his medications because he was tired of feeling bad on them. He is disabled since age 85. He has chronic abdominal pain has had multiple repeat surgeries. He has obstructive sleep apnea but is not currently wearing CPAP. He remains obese.   He was admitted to stepdown and started on IV heparin, NTG.  Morphine was given for pain.  Last troponin reached 4.40.  He was taken for left heart cath which revealed severe native three-vessel coronary artery disease with total occlusion of the RCA, total occlusion of the LAD, and total occlusion of the obtuse marginal branch of the left circumflex. Status post aortocoronary bypass surgery with continued patency of the LIMA to LAD, saphenous vein graft to first diagonal, sequential saphenous vein graft to OM1 and OM 2, and saphenous vein graft to the right PDA. Severe segmental LV systolic dysfunction EF [id number].   He was started on plavix.  The patient was seen by Dr. Tamala Julian who felt he was stable for DC home.    Consults: None  Significant Diagnostic Studies:    Procedural Findings:  Hemodynamics:  AO 100/64  LV 104/20  Coronary angiography:  Coronary dominance: right  Left mainstem: The left main is patent without obstructive  disease  Left anterior descending (LAD): The LAD is totally occluded at the proximal portion.  Left circumflex (LCx): The left circumflex is severely diseased with 80-90% proximal vessel stenosis. The obtuse marginal branches are all occluded. The left PLA branches which are very small, are patent.  Right coronary artery (RCA): The native RCA is totally occluded.    LIMA to LAD: Widely patent throughout. The native LAD is diffusely diseased but patent throughout its course. The vessel retrograde fills back through the first diagonal. The LAD wraps around the left ventricular apex without significant disease.  Saphenous vein graft to first diagonal: Widely patent without significant obstruction.  Saphenous vein graft sequential to OM1 and OM 2 is patent without significant disease. There are some areas of dilatation. The vein is particularly dilated between the OM1 and OM 2 sequence.  Saphenous vein graft to PDA: Patent throughout with no obstructive disease.  Left ventriculography: There is severe hypokinesis of the anterolateral and periapical walls. The basal anterior and basal and midinferior walls contract normally. The estimated LVEF is 35%.   Final Conclusions:  1. Severe native three-vessel coronary artery disease with total occlusion of the RCA, total occlusion of the LAD, and total occlusion of the obtuse marginal branch of the left circumflex  2. Status post aortocoronary bypass surgery with continued patency of the LIMA to LAD, saphenous vein graft to first diagonal, sequential saphenous vein graft to OM1 and OM 2, and saphenous vein graft to the right PDA  3. Severe segmental LV systolic dysfunction  Recommendations: All the patient's grafts are patent. I am not sure why he sustained a non-ST elevation infarction. His symptoms were highly typical of acute coronary syndrome. It is possible that he had a small vessel obstruction or plaque embolization. Recommend that he start Plavix. We'll give him 300 mg today and then 75 mg daily. He is now stable and chest pain-free. Will stop heparin and IV nitroglycerin and transfer him to a step down bed.  Sherren Mocha  Lipid Panel     Component Value Date/Time   CHOL 156 11/01/2013 0710   TRIG 185* 11/01/2013 0710   HDL 27* 11/01/2013 0710   CHOLHDL 5.8 11/01/2013 0710   VLDL 37 11/01/2013 0710   LDLCALC 92  11/01/2013 0710    Treatments: See Above  Discharge Exam: Blood pressure 131/62, pulse 74, temperature 97.4 F (36.3 C), temperature source Oral, resp. rate 20, height 5\' 11"  (1.803 m), weight 267 lb 10.2 oz (121.4 kg), SpO2 100.00%.   Disposition: 01-Home or Self Care      Discharge Orders   Future Appointments Provider Department Dept Phone   11/16/2013 1:30 PM Lendon Colonel, NP [facility] [phone]   Future Orders Complete By Expires   Diet - low sodium heart healthy  As directed    Discharge instructions  As directed    Increase activity slowly  As directed        Medication List    STOP taking these medications       diltiazem 240 MG 24 hr capsule  Commonly known as:  CARDIZEM CD     losartan-hydrochlorothiazide 100-25 MG per tablet  Commonly known as:  HYZAAR      TAKE these medications       acetaminophen 500 MG tablet  Commonly known as:  TYLENOL  Take 500 mg by mouth every 6 (six) hours as needed for mild pain, moderate pain or headache.     albuterol 108 (90  BASE) MCG/ACT inhaler  Commonly known as:  PROVENTIL HFA;VENTOLIN HFA  Inhale 2 puffs into the lungs every 6 (six) hours as needed for wheezing or shortness of breath.     ALPRAZolam 1 MG tablet  Commonly known as:  XANAX  Take 1 mg by mouth 4 (four) times daily as needed for anxiety.     amLODipine 5 MG tablet  Commonly known as:  NORVASC  Take 5 mg by mouth daily.     aspirin EC 325 MG tablet  Take 325 mg by mouth daily.     carvedilol 12.5 MG tablet  Commonly known as:  COREG  Take 1 tablet (12.5 mg total) by mouth 2 (two) times daily with a meal.     citalopram 40 MG tablet  Commonly known as:  CELEXA  Take 20 mg by mouth daily.     cloNIDine 0.1 MG tablet  Commonly known as:  CATAPRES  Take 0.1 mg by mouth daily as needed (*Only taken for blood pressure levels that are >170/100*).     clopidogrel 75 MG tablet  Commonly known as:  PLAVIX  Take 1 tablet (75 mg  total) by mouth daily with breakfast.     hydrochlorothiazide 12.5 MG capsule  Commonly known as:  MICROZIDE  Take 1 capsule (12.5 mg total) by mouth daily.     isosorbide mononitrate 60 MG 24 hr tablet  Commonly known as:  IMDUR  Take 60 mg by mouth 2 (two) times daily.     losartan 25 MG tablet  Commonly known as:  COZAAR  Take 1 tablet (25 mg total) by mouth daily.     nitroGLYCERIN 0.4 MG SL tablet  Commonly known as:  NITROSTAT  Place 0.4 mg under the tongue every 5 (five) minutes as needed. For chest pain     rosuvastatin 40 MG tablet  Commonly known as:  CRESTOR  Take 40 mg by mouth daily.     traMADol 50 MG tablet  Commonly known as:  ULTRAM  Take 50 mg by mouth every 6 (six) hours as needed. For pain     UNKNOWN TO PATIENT  Apply 1 application topically daily as needed (for psoriasis).       Follow-up Information   Follow up with Jory Sims, NP On 11/16/2013. (1:30 PM)    Specialty:  Nurse Practitioner   Contact information:   [address] [place] [address] [phone]       Signed: Tarri Fuller 11/02/2013, 8:28 AM

## 2013-11-02 NOTE — Progress Notes (Signed)
Examined and notes reviewed. Plan medical therapy and discharge later today with OP F/U with Dr. Domenic Polite if no symptoms or problems identified after seen by rehab. Content of noted discussed and all aspects of care reviewed.

## 2013-11-02 NOTE — Progress Notes (Signed)
Subjective: No complaints  Objective: Vital signs in last 24 hours: Temp:  [97.4 F (36.3 C)-99 F (37.2 C)] 97.4 F (36.3 C) (04/21 0353) Pulse Rate:  [67-75] 74 (04/21 0353) Resp:  [16-20] 20 (04/21 0353) BP: (102-136)/(37-73) 131/62 mmHg (04/21 0353) SpO2:  [80 %-100 %] 100 % (04/21 0353) Weight:  [267 lb 10.2 oz (121.4 kg)] 267 lb 10.2 oz (121.4 kg) (04/21 0353) Last BM Date: 11/01/13  Intake/Output from previous day: 04/20 0701 - 04/21 0700 In: 1936.4 [P.O.:600; I.V.:1286.4; IV Piggyback:50] Out: 2300 [Urine:2300] Intake/Output this shift:    Medications Current Facility-Administered Medications  Medication Dose Route Frequency Provider Last Rate Last Dose  . 0.9 %  sodium chloride infusion  250 mL Intravenous PRN Jacolyn Reedy, MD      . 0.9 %  sodium chloride infusion  250 mL Intravenous PRN Sherren Mocha, MD      . ALPRAZolam Duanne Moron) tablet 1 mg  1 mg Oral QID PRN Jacolyn Reedy, MD      . amLODipine (NORVASC) tablet 5 mg  5 mg Oral Daily Sherren Mocha, MD   5 mg at 11/01/13 1853  . aspirin EC tablet 325 mg  325 mg Oral Daily Jacolyn Reedy, MD   325 mg at 10/31/13 2100  . atorvastatin (LIPITOR) tablet 80 mg  80 mg Oral q1800 Jacolyn Reedy, MD   80 mg at 11/01/13 2214  . carvedilol (COREG) tablet 12.5 mg  12.5 mg Oral BID WC Candee Furbish, MD   12.5 mg at 11/01/13 1854  . clopidogrel (PLAVIX) tablet 75 mg  75 mg Oral Q breakfast Sherren Mocha, MD      . heart attack bouncing book   Does not apply Once Larey Dresser, MD      . losartan (COZAAR) tablet 25 mg  25 mg Oral Daily Candee Furbish, MD       And  . hydrochlorothiazide (MICROZIDE) capsule 12.5 mg  12.5 mg Oral Daily Candee Furbish, MD      . isosorbide mononitrate (IMDUR) 24 hr tablet 60 mg  60 mg Oral BID Sherren Mocha, MD   60 mg at 11/01/13 2214  . morphine 2 MG/ML injection 2-4 mg  2-4 mg Intravenous Q2H PRN Jacolyn Reedy, MD      . nitroGLYCERIN (NITROSTAT) SL tablet 0.4 mg  0.4 mg  Sublingual Q5 min PRN Jacolyn Reedy, MD      . sodium chloride 0.9 % injection 3 mL  3 mL Intravenous Q12H Sherren Mocha, MD      . sodium chloride 0.9 % injection 3 mL  3 mL Intravenous PRN Sherren Mocha, MD      . traMADol Veatrice Bourbon) tablet 50 mg  50 mg Oral Q6H PRN Jacolyn Reedy, MD        PE: General appearance: alert, cooperative and no distress Lungs: clear to auscultation bilaterally Heart: regular rate and rhythm, S1, S2 normal, no murmur, click, rub or gallop Extremities: No LEE Pulses: 2+ and symmetric Skin: Warm and dry.  No tenderness in left wrist.   Neurologic: Grossly normal  Lab Results:   Recent Labs  11/01/13 0710 11/02/13 0339  WBC 12.2* 18.4*  HGB 14.4 15.3  HCT 43.3 44.6  PLT 198 215   BMET  Recent Labs  10/31/13 2020 11/02/13 0339  NA 141 140  K 3.9 4.0  CL 104 101  CO2 23 21  GLUCOSE 105* 178*  BUN 17 20  CREATININE  1.11 1.04  CALCIUM 9.1 9.6   PT/INR  Recent Labs  10/31/13 2020  LABPROT 13.5  INR 1.05   Cholesterol  Recent Labs  11/01/13 0710  CHOL 156   Procedural Findings:  Hemodynamics:  AO 100/64  LV 104/20  Coronary angiography:  Coronary dominance: right  Left mainstem: The left main is patent without obstructive disease  Left anterior descending (LAD): The LAD is totally occluded at the proximal portion.  Left circumflex (LCx): The left circumflex is severely diseased with 80-90% proximal vessel stenosis. The obtuse marginal branches are all occluded. The left PLA branches which are very small, are patent.  Right coronary artery (RCA): The native RCA is totally occluded.  LIMA to LAD: Widely patent throughout. The native LAD is diffusely diseased but patent throughout its course. The vessel retrograde fills back through the first diagonal. The LAD wraps around the left ventricular apex without significant disease.  Saphenous vein graft to first diagonal: Widely patent without significant obstruction.  Saphenous  vein graft sequential to OM1 and OM 2 is patent without significant disease. There are some areas of dilatation. The vein is particularly dilated between the OM1 and OM 2 sequence.  Saphenous vein graft to PDA: Patent throughout with no obstructive disease.  Left ventriculography: There is severe hypokinesis of the anterolateral and periapical walls. The basal anterior and basal and midinferior walls contract normally. The estimated LVEF is 35%.  Final Conclusions:  1. Severe native three-vessel coronary artery disease with total occlusion of the RCA, total occlusion of the LAD, and total occlusion of the obtuse marginal branch of the left circumflex  2. Status post aortocoronary bypass surgery with continued patency of the LIMA to LAD, saphenous vein graft to first diagonal, sequential saphenous vein graft to OM1 and OM 2, and saphenous vein graft to the right PDA  3. Severe segmental LV systolic dysfunction  Recommendations: All the patient's grafts are patent. I am not sure why he sustained a non-ST elevation infarction. His symptoms were highly typical of acute coronary syndrome. It is possible that he had a small vessel obstruction or plaque embolization. Recommend that he start Plavix. We'll give him 300 mg today and then 75 mg daily. He is now stable and chest pain-free. Will stop heparin and IV nitroglycerin and transfer him to a step down bed.  Sherren Mocha  Assessment/Plan  Principal Problem:   Non-STEMI (non-ST elevated myocardial infarction) Troponin 4.40(Last).  SP LHC revealing severe native three-vessel coronary artery disease with total occlusion of the RCA, total occlusion of the LAD, and total occlusion of the obtuse marginal branch of the left circumflex.  Status post aortocoronary bypass surgery with continued patency of the LIMA to LAD, saphenous vein graft to first diagonal, sequential saphenous vein graft to OM1 and OM 2, and saphenous vein graft to the right PDA.    Severe  segmental LV systolic dysfunction EF [id number].   BP and HR stable.  ASA, plavix, Coreg 12.5, amlodipine, imdur 60 bid, cozaar 25.    Active Problems:    Tobacco abuse   CAD (coronary artery disease)   History of noncompliance with medical treatment   Obesity (BMI 30-39.9)      LOS: 2 days    Tarri Fuller PA-C 11/02/2013 7:14 AM

## 2013-11-02 NOTE — Progress Notes (Signed)
Pt walking independently, about 1000 ft, without problems. BP 136/85 after walking, HR in 80s. Ed discussed with pt. Has good understanding of what he needs to do. Highly encouraged more exercise to decrease weight, increase circulation and help depression. Pt is thinking about quitting smoking and gave resources. Interested in [place] and will send referral to [place]. Travel might be an issue. [place] CES, ACSM 9:18 AM 11/02/2013

## 2013-11-16 ENCOUNTER — Encounter: Payer: Medicare Other | Admitting: Adult Health

## 2013-11-22 ENCOUNTER — Ambulatory Visit (INDEPENDENT_AMBULATORY_CARE_PROVIDER_SITE_OTHER): Payer: Medicare Other | Admitting: Adult Health

## 2013-11-22 ENCOUNTER — Encounter: Payer: Self-pay | Admitting: Adult Health

## 2013-11-22 VITALS — BP 168/74 | HR 87 | Ht 71.0 in | Wt 269.0 lb

## 2013-11-22 DIAGNOSIS — J309 Allergic rhinitis, unspecified: Secondary | ICD-10-CM | POA: Diagnosis not present

## 2013-11-22 DIAGNOSIS — I2510 Atherosclerotic heart disease of native coronary artery without angina pectoris: Secondary | ICD-10-CM

## 2013-11-22 DIAGNOSIS — IMO0001 Reserved for inherently not codable concepts without codable children: Secondary | ICD-10-CM | POA: Diagnosis not present

## 2013-11-22 DIAGNOSIS — E669 Obesity, unspecified: Secondary | ICD-10-CM | POA: Diagnosis not present

## 2013-11-22 DIAGNOSIS — G4733 Obstructive sleep apnea (adult) (pediatric): Secondary | ICD-10-CM | POA: Diagnosis not present

## 2013-11-22 DIAGNOSIS — J45902 Unspecified asthma with status asthmaticus: Secondary | ICD-10-CM | POA: Diagnosis not present

## 2013-11-22 DIAGNOSIS — I214 Non-ST elevation (NSTEMI) myocardial infarction: Secondary | ICD-10-CM | POA: Diagnosis not present

## 2013-11-22 DIAGNOSIS — I10 Essential (primary) hypertension: Secondary | ICD-10-CM | POA: Diagnosis not present

## 2013-11-22 DIAGNOSIS — IMO0002 Reserved for concepts with insufficient information to code with codable children: Secondary | ICD-10-CM | POA: Diagnosis not present

## 2013-11-22 DIAGNOSIS — E782 Mixed hyperlipidemia: Secondary | ICD-10-CM | POA: Diagnosis not present

## 2013-11-22 DIAGNOSIS — F17200 Nicotine dependence, unspecified, uncomplicated: Secondary | ICD-10-CM | POA: Diagnosis not present

## 2013-11-22 NOTE — Patient Instructions (Signed)
Your physician recommends that you schedule a follow-up appointment in: 3 months with Dr Ferne Reus will receive a reminder letter two months in advance reminding you to call and schedule your appointment. If you don't receive this letter, please contact our office.  Your physician recommends that you continue on your current medications as directed. Please refer to the Current Medication list given to you today.

## 2013-11-22 NOTE — Progress Notes (Deleted)
Name: [name]    DOB: 11-24-1965  Age: 48 y.o.  MR#: [id number]       PCP:  Gar Ponto, MD      Insurance: Payor: MEDICARE / Plan: MEDICARE PART A AND B / Product Type: *No Product type* /   CC:    Chief Complaint  Patient presents with  . Coronary Artery Disease    VS Filed Vitals:   11/22/13 1354  BP: 168/74  Pulse: 87  Height: 5\' 11"  (1.803 m)  Weight: 269 lb (122.018 kg)    Weights Current Weight  11/22/13 269 lb (122.018 kg)  11/02/13 267 lb 10.2 oz (121.4 kg)  11/02/13 267 lb 10.2 oz (121.4 kg)    Blood Pressure  BP Readings from Last 3 Encounters:  11/22/13 168/74  11/02/13 140/65  11/02/13 140/65     Admit date:  (Not on file) Last encounter with RMR:  Visit date not found   Allergy Contrast media; Ibuprofen; and Nsaids  Current Outpatient Prescriptions  Medication Sig Dispense Refill  . acetaminophen (TYLENOL) 500 MG tablet Take 500 mg by mouth every 6 (six) hours as needed for mild pain, moderate pain or headache.      . albuterol (PROVENTIL HFA;VENTOLIN HFA) 108 (90 BASE) MCG/ACT inhaler Inhale 2 puffs into the lungs every 6 (six) hours as needed for wheezing or shortness of breath.      . ALPRAZolam (XANAX) 1 MG tablet Take 1 mg by mouth 4 (four) times daily as needed for anxiety.       [name] aspirin EC 325 MG tablet Take 325 mg by mouth daily.      . carvedilol (COREG) 12.5 MG tablet Take 12.5 mg by mouth 2 (two) times daily with a meal.      . citalopram (CELEXA) 40 MG tablet Take 20 mg by mouth daily.       . cloNIDine (CATAPRES) 0.1 MG tablet Take 0.1 mg by mouth daily as needed (*Only taken for blood pressure levels that are >170/100*).      [name] clopidogrel (PLAVIX) 75 MG tablet Take 1 tablet (75 mg total) by mouth daily with breakfast.  30 tablet  11  . diltiazem (TIAZAC) 360 MG 24 hr capsule Take 360 mg by mouth daily.      . hydrochlorothiazide (MICROZIDE) 12.5 MG capsule Take 1 capsule (12.5 mg total) by mouth daily.  30 capsule  5  .  isosorbide mononitrate (IMDUR) 60 MG 24 hr tablet Take 60 mg by mouth 2 (two) times daily.       [name] losartan (COZAAR) 25 MG tablet Take 1 tablet (25 mg total) by mouth daily.  30 tablet  5  . nitroGLYCERIN (NITROSTAT) 0.4 MG SL tablet Place 0.4 mg under the tongue every 5 (five) minutes as needed. For chest pain      . rosuvastatin (CRESTOR) 40 MG tablet Take 40 mg by mouth daily.      . traMADol (ULTRAM) 50 MG tablet Take 50 mg by mouth every 6 (six) hours as needed. For pain      . UNKNOWN TO PATIENT Apply 1 application topically daily as needed (for psoriasis).       No current facility-administered medications for this visit.    Discontinued Meds:    Medications Discontinued During This Encounter  Medication Reason  . carvedilol (COREG) 12.5 MG tablet Error  . amLODipine (NORVASC) 5 MG tablet Error    Patient Active Problem List   Diagnosis Date Noted  .  History of noncompliance with medical treatment 10/31/2013  . Non-STEMI (non-ST elevated myocardial infarction) 10/31/2013  . Obesity (BMI 30-39.9) 10/31/2013  . Sleep apnea 10/31/2013  . Anxiety   . COPD (chronic obstructive pulmonary disease)   . History of stroke 02/07/2012  . Tobacco abuse   . Essential hypertension, benign 08/16/2008  . Hyperlipidemia   . CAD (coronary artery disease)     LABS    Component Value Date/Time   NA 140 11/02/2013 0339   NA 141 10/31/2013 2020   NA 142 07/21/2013 1335   K 4.0 11/02/2013 0339   K 3.9 10/31/2013 2020   K 4.1 07/21/2013 1335   CL 101 11/02/2013 0339   CL 104 10/31/2013 2020   CL 105 07/21/2013 1335   CO2 21 11/02/2013 0339   CO2 23 10/31/2013 2020   CO2 24 07/21/2013 1335   GLUCOSE 178* 11/02/2013 0339   GLUCOSE 105* 10/31/2013 2020   GLUCOSE 105* 07/21/2013 1335   BUN 20 11/02/2013 0339   BUN 17 10/31/2013 2020   BUN 14 07/21/2013 1335   CREATININE 1.04 11/02/2013 0339   CREATININE 1.11 10/31/2013 2020   CREATININE 1.00 07/21/2013 1335   CALCIUM 9.6 11/02/2013 0339   CALCIUM 9.1  10/31/2013 2020   CALCIUM 9.5 07/21/2013 1335   GFRNONAA 85* 11/02/2013 0339   GFRNONAA 79* 10/31/2013 2020   GFRNONAA 89* 07/21/2013 1335   GFRAA >90 11/02/2013 0339   GFRAA >90 10/31/2013 2020   GFRAA >90 07/21/2013 1335   CMP     Component Value Date/Time   NA 140 11/02/2013 0339   K 4.0 11/02/2013 0339   CL 101 11/02/2013 0339   CO2 21 11/02/2013 0339   GLUCOSE 178* 11/02/2013 0339   BUN 20 11/02/2013 0339   CREATININE 1.04 11/02/2013 0339   CALCIUM 9.6 11/02/2013 0339   PROT 6.7 10/31/2013 2020   ALBUMIN 3.5 10/31/2013 2020   AST 53* 10/31/2013 2020   ALT 41 10/31/2013 2020   ALKPHOS 49 10/31/2013 2020   BILITOT 0.6 10/31/2013 2020   GFRNONAA 85* 11/02/2013 0339   GFRAA >90 11/02/2013 0339       Component Value Date/Time   WBC 18.4* 11/02/2013 0339   WBC 12.2* 11/01/2013 0710   WBC 9.9 07/21/2013 1335   HGB 15.3 11/02/2013 0339   HGB 14.4 11/01/2013 0710   HGB 16.7 07/21/2013 1335   HCT 44.6 11/02/2013 0339   HCT 43.3 11/01/2013 0710   HCT 48.5 07/21/2013 1335   MCV 89.0 11/02/2013 0339   MCV 90.0 11/01/2013 0710   MCV 88.3 07/21/2013 1335    Lipid Panel     Component Value Date/Time   CHOL 156 11/01/2013 0710   TRIG 185* 11/01/2013 0710   HDL 27* 11/01/2013 0710   CHOLHDL 5.8 11/01/2013 0710   VLDL 37 11/01/2013 0710   LDLCALC 92 11/01/2013 0710    ABG    Component Value Date/Time   PHART 7.353 07/29/2008 2010   PCO2ART 44.1 07/29/2008 2010   PO2ART 96.0 07/29/2008 2010   HCO3 24.5* 07/29/2008 2010   TCO2 29 07/30/2008 1642   ACIDBASEDEF 1.0 07/29/2008 2010   O2SAT 97.0 07/29/2008 2010     Lab Results  Component Value Date   TSH 1.500 10/31/2013   BNP (last 3 results) No results found for this basename: PROBNP,  in the last 8760 hours Cardiac Panel (last 3 results) No results found for this basename: CKTOTAL, CKMB, TROPONINI, RELINDX,  in the last 72 hours  Iron/TIBC/Ferritin No results found for this basename: iron, tibc, ferritin     EKG Orders placed during the hospital encounter of  10/31/13  . EKG 12-LEAD  . EKG 12-LEAD     Prior Assessment and Plan Problem List as of 11/22/2013     Cardiovascular and Mediastinum   Essential hypertension, benign   Last Assessment & Plan   10/12/2012 Office Visit Written 10/12/2012  2:40 PM by Donney Dice, PA-C     Well-controlled on current medication regimen    CAD (coronary artery disease)   Last Assessment & Plan   10/12/2012 Office Visit Written 10/12/2012  2:39 PM by Donney Dice, PA-C     Patient is now 4 years out since undergoing multivessel CABG in February 2010. He has not had any subsequent ischemic evaluation. We will order a Lexiscan stress Cardiolite and, if this is negative for ischemia, he is cleared then to proceed with abdominal surgery, as planned. He is to hold Plavix for 5 days prior to surgery, and then resume once cleared to do so. Will decrease ASA 81 mg daily as of today, and which he can then continue indefinitely. Of note, I will also order a complete echocardiogram for reassessment of LVF. This was previously assessed as EF 33 % by nuclear imaging, and EF 45-50% by echocardiography, both in April 2009.    Non-STEMI (non-ST elevated myocardial infarction)     Respiratory   COPD (chronic obstructive pulmonary disease)     Other   Hyperlipidemia   Last Assessment & Plan   10/12/2012 Office Visit Written 10/12/2012  2:40 PM by Donney Dice, PA-C     Followed by primary M.D. Of note, patient is currently not on a statin, but previously was on Crestor.    Tobacco abuse   Last Assessment & Plan   10/12/2012 Office Visit Written 10/12/2012  2:41 PM by Donney Dice, PA-C     Complete smoking cessation strongly emphasized    History of stroke   Last Assessment & Plan   02/07/2012 Office Visit Written 02/07/2012  9:05 AM by Satira Sark, MD     Outlined above. Patient with poorly controlled hypertension preceding the event by report, also continues to smoke cigarettes. He did not have obstructive  carotid disease by Dopplers, and echocardiogram was overall reassuring. For now have recommended smoking cessation, reinforced compliance with his regular medications (his blood pressure is normal today}, and will also check a 7 day cardiac monitor to screen for any potential atrial arrhythmias that that would necessitate discussion of anticoagulant therapy. For now no changes made to current regimen.    Anxiety   History of noncompliance with medical treatment   Obesity (BMI 30-39.9)   Sleep apnea       Imaging: No results found.

## 2013-11-22 NOTE — Progress Notes (Signed)
HPI: Mr. Laos is a 47 year old patient of Dr. Domenic Polite we are following for ongoing assessment and management of CAD, recent admission to [facility] in April of 2014 in the setting of non-ST elevation MI, with known history of tobacco abuse, medical noncompliance, and obesity.    The patient has a complicated medical history with multiple evaluations for chest pain showing nonobstructive CAD beginning in the 1990s and ultimately having LAD stent with a Cypher DES in 2003. He later develops in-stent thrombosis and had to be reopened. He has had multiple repeat cardiac catheterizations and later had severe anaphylactic reaction to contrast despite premedication and eventually had to have allergy testing done at [facility] which found he could not tolerate Isovue.   The patient had bypass surgery by Dr. Caffie Pinto with LIMA to LAD SVG to the high SVG to OM1 and OM 2, SVG to right coronary artery. 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 Prior to recent admission he stopped taking his medications for 2 months continue to smoke.        He stopped taking medications because she felt that taking them at that time. During hospitalization his troponin was elevated at 4.40. The patient had a cardiac catheterization revealing severe native three-vessel CAD with total occlusion of the RCA. Total occlusion of LAD and total occlusion of obtuse marginal branch the left circumflex. He has continued patency of the LIMA to LAD, SVG to first I know, and SVG to OM1 and OM 2, and SVG to right PDA. The patient had severe segmental LV systolic dysfunction with an EF of 35%. He was started on Plavix, and was felt to be stable to return home on reinstitution of medications he was should have been taking prior to admission.   He comes today after recently being seen by his primary care physician Dr.  Quillian Quince. Unfortunately, Dr. Quillian Quince did not know  the patient had been in the hospital, or has had medication adjustments. The patient's medication was changed by Dr. Quillian Quince today. He was taken off amlodipine, and started on diltiazem.    The patient is slightly confused now about what medications he is to take as he has 2 different lists of medicines. His wife is an Therapist, sports, and manages his medicines. Not previous medications with him. At this time the patient is completely asymptomatic. He unfortunately continues to smoke but has cut down to half a pack a day.       Allergies  Allergen Reactions  . Contrast Media [Iodinated Diagnostic Agents] Anaphylaxis, Shortness Of Breath, Swelling and Rash    Isovue contrast is most acceptable agent based on previous experience and testing with premedications  . Ibuprofen Anaphylaxis, Hives and Swelling  . Nsaids Anaphylaxis, Hives, Swelling and Rash    Current Outpatient Prescriptions  Medication Sig Dispense Refill  . acetaminophen (TYLENOL) 500 MG tablet Take 500 mg by mouth every 6 (six) hours as needed for mild pain, moderate pain or headache.      . albuterol (PROVENTIL HFA;VENTOLIN HFA) 108 (90 BASE) MCG/ACT inhaler Inhale 2 puffs into the lungs every 6 (six) hours as needed for wheezing or shortness of breath.      . ALPRAZolam (XANAX) 1 MG tablet Take 1 mg by mouth 4 (four) times daily as needed for anxiety.       [name] amLODipine (NORVASC) 5 MG  tablet Take 5 mg by mouth daily.      [name] aspirin EC 325 MG tablet Take 325 mg by mouth daily.      . carvedilol (COREG) 12.5 MG tablet Take 12.5 mg by mouth 2 (two) times daily with a meal.      . citalopram (CELEXA) 40 MG tablet Take 20 mg by mouth daily.       . cloNIDine (CATAPRES) 0.1 MG tablet Take 0.1 mg by mouth daily as needed (*Only taken for blood pressure levels that are >170/100*).      [name] clopidogrel (PLAVIX) 75 MG tablet Take 1 tablet (75 mg total) by mouth daily with breakfast.  30 tablet  11  .  hydrochlorothiazide (MICROZIDE) 12.5 MG capsule Take 1 capsule (12.5 mg total) by mouth daily.  30 capsule  5  . isosorbide mononitrate (IMDUR) 60 MG 24 hr tablet Take 60 mg by mouth 2 (two) times daily.       [name] losartan (COZAAR) 25 MG tablet Take 1 tablet (25 mg total) by mouth daily.  30 tablet  5  . nitroGLYCERIN (NITROSTAT) 0.4 MG SL tablet Place 0.4 mg under the tongue every 5 (five) minutes as needed. For chest pain      . rosuvastatin (CRESTOR) 40 MG tablet Take 40 mg by mouth daily.      . traMADol (ULTRAM) 50 MG tablet Take 50 mg by mouth every 6 (six) hours as needed. For pain      . UNKNOWN TO PATIENT Apply 1 application topically daily as needed (for psoriasis).       No current facility-administered medications for this visit.    Past Medical History  Diagnosis Date  . Mixed hyperlipidemia   . Essential hypertension, benign   . COPD (chronic obstructive pulmonary disease)   . Depression   . Anaphylaxis     IGE mediated  . Myocardial infarction 2010  . Anxiety   . Arthritis   . Diabetes mellitus without complication     diet controlled  . OSA (obstructive sleep apnea)     over 1 year ago suopped using cpap-discomfort at night  . History of CVA (cerebrovascular accident) 01/2012    right posterior frontal cortical and subcortical brain by MRI, no hemorrhage. Carotid Dopplers showed only 1-50% bilateral ICA stenoses. Echocardiogram showed LVEF 50%, no major valvular abnormalities.    Past Surgical History  Procedure Laterality Date  . Gastric bypass  2010  . Hernia repair  2011, 2012  . Toe amputation  1998    right 1st and 2nd toe  . Tonsilectomy, adenoidectomy, bilateral myringotomy and tubes    . Dental surgery  2003  . Cholecystectomy    . Coronary artery bypass graft  2010    LIMA to LAD, SVG to diagonal, SVG to OM1 and OM 2, SVG to RCA  . Tonsillectomy    . Ventral hernia repair N/A 10/28/2012    Procedure: LAPAROSCOPIC VENTRAL HERNIA;  Surgeon: Donato Heinz, MD;  Location: [facility];  Service: General;  Laterality: N/A;  . Coronary angioplasty  2008  . Incisional hernia repair N/A 07/23/2013    Procedure: HERNIA REPAIR INCISIONAL WITH MESH;  Surgeon: Jamesetta So, MD;  Location: [facility];  Service: General;  Laterality: N/A;  . Insertion of mesh N/A 07/23/2013    Procedure: INSERTION OF MESH;  Surgeon: Jamesetta So, MD;  Location: [facility];  Service: General;  Laterality: N/A;    ROS: Review of systems  complete and found to be negative unless listed above  PHYSICAL EXAM BP 168/74  Pulse 87  Ht 5\' 11"  (1.803 m)  Wt 269 lb (122.018 kg)  BMI 37.53 kg/m2 General: Well developed, well nourished, in no acute distress, but Head: Eyes PERRLA, No xanthomas.   Normal cephalic and atramatic  Lungs: Clear bilaterally to auscultation and percussion. Heart: HRRR S1 S2, without MRG.  Pulses are 2+ & equal.            No carotid bruit. No JVD.  No abdominal bruits. No femoral bruits. Abdomen: Bowel sounds are positive, abdomen soft and non-tender without masses or                  Hernia's noted. Msk:  Back normal, normal gait. Normal strength and tone for age. Extremities: No clubbing, cyanosis or edema.  DP +1 Neuro: Alert and oriented X 3. Psych:  Good affect, responds appropriately   ASSESSMENT AND PLAN

## 2013-11-22 NOTE — Assessment & Plan Note (Signed)
He offers no cardiac complaints at this time. It appears stable concerning angina symptoms. He will be continued on medical management and risk modification.

## 2013-11-22 NOTE — Assessment & Plan Note (Signed)
Due to significant systolic dysfunction, we will take him off diltiazem. This calcium channel blocker has been known to potentiate CHF with reduced LV function. He will be continued on amlodipine 5 mg. He was to be continued on carvedilol 12.5 mg twice a day with plan to up titrate to 25 mg twice a day.   I have gone over his medication regimen, along with this recent cardiac catheterization results I have explained to him his systolic dysfunction. Blood pressure is mildly elevated today he has just finished smoking, I have advised him that we will need him to have a lower blood pressure overall with his reduced systolic function. Will need to titrate up the carvedilol as discussed on followup appointment. We are awaiting his wife to call as she will be going over his medications and calling us back if she has the list and is aware what he is taking. He did not bring his medications with him. If he is indeed taking carvedilol 12.5 mg twice a day, I will increase it to 25 mg twice a day.

## 2013-11-22 NOTE — Assessment & Plan Note (Signed)
Blood pressure is elevated today at 160/74, he will need to have a much lower blood pressure closer to [id number] systolic with his EF at [id number]. He is currently on colchicine 0.1 mg when necessary. May need to discuss the need to take this daily. He is currently on HCTZ 12.5 mg along with losartan 25 mg daily. He had been on Hyzaar 100/25 mg prior to admission. I am not certain which medication he is taking as he is confused concerning which doses. May need to have him go back to a higher dose of ARB if he is taking a lower dose at this time. Will have close followup by Dr. Domenic Polite in one month in [place].

## 2013-11-23 ENCOUNTER — Telehealth: Payer: Self-pay | Admitting: Adult Health

## 2013-11-23 NOTE — Telephone Encounter (Signed)
Patient states that he needs to go over med list / tgs

## 2013-11-24 DIAGNOSIS — E669 Obesity, unspecified: Secondary | ICD-10-CM | POA: Diagnosis not present

## 2013-11-24 DIAGNOSIS — I10 Essential (primary) hypertension: Secondary | ICD-10-CM | POA: Diagnosis not present

## 2013-11-24 DIAGNOSIS — F17200 Nicotine dependence, unspecified, uncomplicated: Secondary | ICD-10-CM | POA: Diagnosis not present

## 2013-11-24 DIAGNOSIS — E782 Mixed hyperlipidemia: Secondary | ICD-10-CM | POA: Diagnosis not present

## 2013-11-24 MED ORDER — CLONIDINE HCL 0.1 MG PO TABS: 0.1000 mg | ORAL_TABLET | Freq: Every day | ORAL | Status: DC | PRN

## 2013-11-24 MED ORDER — LISINOPRIL 20 MG PO TABS: 20.0000 mg | ORAL_TABLET | Freq: Every day | ORAL | Status: DC

## 2013-11-24 MED ORDER — CARVEDILOL 25 MG PO TABS: 25.0000 mg | ORAL_TABLET | Freq: Two times a day (BID) | ORAL | Status: DC

## 2013-11-24 NOTE — Telephone Encounter (Signed)
Per KL, NP : As per conversation:  1. He should be on 25 mg BID of coreg  2. D/C cozaar if insurance does not pay. Change to lisinopril 20 mg daily. Stay on HCTZ   3. Discontinue amlodipine in computer med list   4. Continue clonidine for now.   5. Should be seen on follow up soon with BMET.

## 2013-11-24 NOTE — Telephone Encounter (Signed)
Pt went over medications with me from after visit. Will confirm with KL, NP for correction.

## 2013-11-25 ENCOUNTER — Other Ambulatory Visit: Payer: Self-pay

## 2013-11-25 MED ORDER — LOSARTAN POTASSIUM-HCTZ 100-25 MG PO TABS: 1.0000 | ORAL_TABLET | Freq: Every day | ORAL | Status: DC

## 2013-11-25 NOTE — Telephone Encounter (Signed)
Changed from previous note. Due to coverage for insurance. Pt is taking the following: Coreg 25 mg BID Losartan/HCTZ 100/25 mg daily NO Lisinopril NO Amlodipine NO diltiazem Imdur 60 mg BID

## 2013-11-25 NOTE — Telephone Encounter (Signed)
Continue previous note: Plavix 75 mg Clonidine 0.1 mg as needed  Pt is now up to date on medications

## 2013-11-25 NOTE — Progress Notes (Signed)
   Patient ID: [name], male    DOB: June 12, 1966, 48 y.o.   MRN: [id number]  HPI    Review of Systems    Physical Exam     ERROR no show.

## 2013-12-08 ENCOUNTER — Ambulatory Visit: Payer: Medicare Other | Admitting: Adult Health

## 2013-12-08 DIAGNOSIS — L723 Sebaceous cyst: Secondary | ICD-10-CM | POA: Diagnosis not present

## 2013-12-08 DIAGNOSIS — E782 Mixed hyperlipidemia: Secondary | ICD-10-CM | POA: Diagnosis not present

## 2013-12-08 DIAGNOSIS — F17200 Nicotine dependence, unspecified, uncomplicated: Secondary | ICD-10-CM | POA: Diagnosis not present

## 2013-12-08 DIAGNOSIS — E119 Type 2 diabetes mellitus without complications: Secondary | ICD-10-CM | POA: Diagnosis not present

## 2013-12-08 DIAGNOSIS — I10 Essential (primary) hypertension: Secondary | ICD-10-CM | POA: Diagnosis not present

## 2013-12-08 DIAGNOSIS — I6789 Other cerebrovascular disease: Secondary | ICD-10-CM | POA: Diagnosis not present

## 2013-12-08 DIAGNOSIS — M1990 Unspecified osteoarthritis, unspecified site: Secondary | ICD-10-CM | POA: Diagnosis not present

## 2013-12-08 DIAGNOSIS — E669 Obesity, unspecified: Secondary | ICD-10-CM | POA: Diagnosis not present

## 2013-12-10 NOTE — Progress Notes (Signed)
This encounter was created in error - please disregard.

## 2013-12-14 DIAGNOSIS — IMO0001 Reserved for inherently not codable concepts without codable children: Secondary | ICD-10-CM | POA: Diagnosis not present

## 2013-12-14 DIAGNOSIS — IMO0002 Reserved for concepts with insufficient information to code with codable children: Secondary | ICD-10-CM | POA: Diagnosis not present

## 2013-12-14 DIAGNOSIS — E669 Obesity, unspecified: Secondary | ICD-10-CM | POA: Diagnosis not present

## 2013-12-14 DIAGNOSIS — F17200 Nicotine dependence, unspecified, uncomplicated: Secondary | ICD-10-CM | POA: Diagnosis not present

## 2013-12-14 DIAGNOSIS — G4733 Obstructive sleep apnea (adult) (pediatric): Secondary | ICD-10-CM | POA: Diagnosis not present

## 2013-12-14 DIAGNOSIS — J45902 Unspecified asthma with status asthmaticus: Secondary | ICD-10-CM | POA: Diagnosis not present

## 2013-12-14 DIAGNOSIS — E782 Mixed hyperlipidemia: Secondary | ICD-10-CM | POA: Diagnosis not present

## 2013-12-14 DIAGNOSIS — J309 Allergic rhinitis, unspecified: Secondary | ICD-10-CM | POA: Diagnosis not present

## 2013-12-20 DIAGNOSIS — G4733 Obstructive sleep apnea (adult) (pediatric): Secondary | ICD-10-CM | POA: Diagnosis not present

## 2013-12-20 DIAGNOSIS — E669 Obesity, unspecified: Secondary | ICD-10-CM | POA: Diagnosis not present

## 2013-12-20 DIAGNOSIS — IMO0001 Reserved for inherently not codable concepts without codable children: Secondary | ICD-10-CM | POA: Diagnosis not present

## 2013-12-20 DIAGNOSIS — IMO0002 Reserved for concepts with insufficient information to code with codable children: Secondary | ICD-10-CM | POA: Diagnosis not present

## 2013-12-20 DIAGNOSIS — E782 Mixed hyperlipidemia: Secondary | ICD-10-CM | POA: Diagnosis not present

## 2013-12-20 DIAGNOSIS — F17200 Nicotine dependence, unspecified, uncomplicated: Secondary | ICD-10-CM | POA: Diagnosis not present

## 2013-12-20 DIAGNOSIS — J309 Allergic rhinitis, unspecified: Secondary | ICD-10-CM | POA: Diagnosis not present

## 2013-12-20 DIAGNOSIS — J45902 Unspecified asthma with status asthmaticus: Secondary | ICD-10-CM | POA: Diagnosis not present

## 2013-12-30 ENCOUNTER — Encounter: Payer: Self-pay | Admitting: Cardiology

## 2013-12-30 NOTE — Progress Notes (Signed)
Clinical Summary [name] is a medically complex 48 y.o.male that I have not seen the office since July 2013. He had a recent visit with Ms. Lawrence NP just a few weeks ago. I reviewed his interval records including recent hospitalization in April with NSTEMI in the setting of medication noncompliance and continued tobacco abuse. He did undergo cardiac catheterization during that hospital stay demonstrating severe multivessel disease with patency of the LIMA to LAD, SVG to first diagonal, SVG to OM1 and OM 2, and SVG to PDA. LVEF was unfortunately approximately 35% based on ventriculography. He has not had a recent followup echocardiogram.  He is here today to review his medications. Norvasc is the most recent addition per Dr. Quillian Quince. He has had blood pressure checked at home, his wife is a nurse, and readings are starting to look better. He is not reporting any chest pain or unusual breathlessness. Still has not been able to quit smoking. We discussed the critical importance of medication compliance, diet and exercise, and smoking cessation.  Recent lab work per Dr. Quillian Quince in May showed cholesterol 179, triglycerides 246, HDL 27, LDL 103, BUN 15, creatinine 1.0, potassium 4.0, normal LFTs, hemoglobin A1c 6.0.   Allergies  Allergen Reactions  . Contrast Media [Iodinated Diagnostic Agents] Anaphylaxis, Shortness Of Breath, Swelling and Rash    Isovue contrast is most acceptable agent based on previous experience and testing with premedications  . Ibuprofen Anaphylaxis, Hives and Swelling  . Nsaids Anaphylaxis, Hives, Swelling and Rash    Current Outpatient Prescriptions  Medication Sig Dispense Refill  . acetaminophen (TYLENOL) 500 MG tablet Take 500 mg by mouth every 6 (six) hours as needed for mild pain, moderate pain or headache.      . albuterol (PROVENTIL HFA;VENTOLIN HFA) 108 (90 BASE) MCG/ACT inhaler Inhale 2 puffs into the lungs every 6 (six) hours as needed for wheezing or  shortness of breath.      . ALPRAZolam (XANAX) 1 MG tablet Take 1 mg by mouth 4 (four) times daily as needed for anxiety.       [name] amLODipine (NORVASC) 10 MG tablet Take 10 mg by mouth daily.      [name] aspirin EC 325 MG tablet Take 325 mg by mouth daily.      . carvedilol (COREG) 25 MG tablet Take 1 tablet (25 mg total) by mouth 2 (two) times daily with a meal.  60 tablet  6  . cloNIDine (CATAPRES) 0.1 MG tablet Take 1 tablet (0.1 mg total) by mouth daily as needed (*Only taken for blood pressure levels that are >170/100*).  60 tablet  1  . clopidogrel (PLAVIX) 75 MG tablet Take 1 tablet (75 mg total) by mouth daily with breakfast.  30 tablet  11  . DULoxetine (CYMBALTA) 60 MG capsule Take 60 mg by mouth daily.      . isosorbide mononitrate (IMDUR) 60 MG 24 hr tablet Take 60 mg by mouth 2 (two) times daily.       [name] losartan-hydrochlorothiazide (HYZAAR) 100-25 MG per tablet Take 1 tablet by mouth daily.  30 tablet  6  . rosuvastatin (CRESTOR) 40 MG tablet Take 40 mg by mouth daily.      . traMADol (ULTRAM) 50 MG tablet Take 50 mg by mouth every 6 (six) hours as needed. For pain      . UNKNOWN TO PATIENT Apply 1 application topically daily as needed (for psoriasis).      . nitroGLYCERIN (NITROSTAT) 0.4 MG SL  tablet Place 0.4 mg under the tongue every 5 (five) minutes as needed. For chest pain       No current facility-administered medications for this visit.    Past Medical History  Diagnosis Date  . Mixed hyperlipidemia   . Essential hypertension, benign   . COPD (chronic obstructive pulmonary disease)   . Depression   . Anaphylaxis     IGE mediated  . Myocardial infarction 2010  . Anxiety   . Arthritis   . Type 2 diabetes mellitus   . OSA (obstructive sleep apnea)   . History of CVA (cerebrovascular accident) 01/2012    Right posterior frontal cortical and subcortical brain by MRI, no hemorrhage. Carotid Dopplers showed only 1-50% bilateral ICA stenoses. Echocardiogram showed LVEF 50%, no  major valvular abnormalities.  . Coronary atherosclerosis of native coronary artery     Multivessel status post CABG 2010  . Cardiomyopathy     Past Surgical History  Procedure Laterality Date  . Gastric bypass  2010  . Hernia repair  2011, 2012  . Toe amputation  1998    right 1st and 2nd toe  . Tonsilectomy, adenoidectomy, bilateral myringotomy and tubes    . Dental surgery  2003  . Cholecystectomy    . Coronary artery bypass graft  2010    LIMA to LAD, SVG to diagonal, SVG to OM1 and OM 2, SVG to RCA  . Tonsillectomy    . Ventral hernia repair N/A 10/28/2012    Procedure: LAPAROSCOPIC VENTRAL HERNIA;  Surgeon: Donato Heinz, MD;  Location: [facility];  Service: General;  Laterality: N/A;  . Coronary angioplasty  2008  . Incisional hernia repair N/A 07/23/2013    Procedure: HERNIA REPAIR INCISIONAL WITH MESH;  Surgeon: Jamesetta So, MD;  Location: [facility];  Service: General;  Laterality: N/A;  . Insertion of mesh N/A 07/23/2013    Procedure: INSERTION OF MESH;  Surgeon: Jamesetta So, MD;  Location: [facility];  Service: General;  Laterality: N/A;    Social History [name] reports that he has been smoking Cigarettes.  He has a 30 pack-year smoking history. He has never used smokeless tobacco. [name] reports that he drinks alcohol.  Review of Systems No palpitations, dizziness, syncope, orthopnea, PND. No claudication. Other systems reviewed and negative.  Physical Examination Filed Vitals:   12/31/13 1104  BP: 171/91  Pulse: 92   Filed Weights   12/31/13 1104  Weight: 268 lb (121.564 kg)   Overweight male, appears comfortable at rest. HEENT: Conjunctiva and lids normal, oropharynx clear. Neck: Supple, no elevated JVP or carotid bruits, no thyromegaly. Lungs: Clear to auscultation, nonlabored breathing at rest. Cardiac: Regular rate and rhythm, no S3, soft systolic murmur, no pericardial rub. Abdomen: Soft, nontender, bowel sounds present, no guarding or  rebound. Extremities: No pitting edema, distal pulses 2+. Skin: Warm and dry. Musculoskeletal: No kyphosis. Neuropsychiatric: Alert and oriented x3, affect grossly appropriate.   Problem List and Plan   Coronary atherosclerosis of native coronary artery Multivessel CAD status post prior CABG, bypass grafts found to be patent at recent cardiac catheterization in the setting of NSTEMI. He is not reporting any active angina, and continues on DAPT for now.  Secondary cardiomyopathy LVEF estimated at 35% at ventriculography with cardiac catheterization. He has not had an echocardiogram and this will be arranged for more definitive assessment. Plan is to continue medical therapy, uptitrate doses as tolerated. If LVEF does not improve, we will need to at least  discuss the possibility of prophylactic ICD placement. We did touch on this today.  Essential hypertension, benign Blood pressure control trend is improving with medication adjustments. Also discussed diet, sodium restriction, and weight loss.  Tobacco abuse Have discussed the critical importance of smoking cessation.  Hyperlipidemia On Crestor, recent followup lab work noted.    Satira Sark, M.D., F.A.C.C.

## 2013-12-31 ENCOUNTER — Ambulatory Visit (INDEPENDENT_AMBULATORY_CARE_PROVIDER_SITE_OTHER): Payer: Medicare Other | Admitting: Cardiology

## 2013-12-31 ENCOUNTER — Encounter: Payer: Self-pay | Admitting: Cardiology

## 2013-12-31 VITALS — BP 171/91 | HR 92 | Ht 71.0 in | Wt 268.0 lb

## 2013-12-31 DIAGNOSIS — I428 Other cardiomyopathies: Secondary | ICD-10-CM | POA: Diagnosis not present

## 2013-12-31 DIAGNOSIS — I2510 Atherosclerotic heart disease of native coronary artery without angina pectoris: Secondary | ICD-10-CM | POA: Diagnosis not present

## 2013-12-31 DIAGNOSIS — E785 Hyperlipidemia, unspecified: Secondary | ICD-10-CM

## 2013-12-31 DIAGNOSIS — I429 Cardiomyopathy, unspecified: Secondary | ICD-10-CM

## 2013-12-31 DIAGNOSIS — I10 Essential (primary) hypertension: Secondary | ICD-10-CM | POA: Diagnosis not present

## 2013-12-31 DIAGNOSIS — F17200 Nicotine dependence, unspecified, uncomplicated: Secondary | ICD-10-CM

## 2013-12-31 NOTE — Patient Instructions (Signed)
Your physician has requested that you have an echocardiogram. Echocardiography is a painless test that uses sound waves to create images of your heart. It provides your doctor with information about the size and shape of your heart and how well your heart's chambers and valves are working. This procedure takes approximately one hour. There are no restrictions for this procedure. Office will contact with results via phone or letter.   Follow up in  6 weeks

## 2013-12-31 NOTE — Assessment & Plan Note (Signed)
Blood pressure control trend is improving with medication adjustments. Also discussed diet, sodium restriction, and weight loss.

## 2013-12-31 NOTE — Assessment & Plan Note (Signed)
LVEF estimated at 35% at ventriculography with cardiac catheterization. He has not had an echocardiogram and this will be arranged for more definitive assessment. Plan is to continue medical therapy, uptitrate doses as tolerated. If LVEF does not improve, we will need to at least discuss the possibility of prophylactic ICD placement. We did touch on this today.

## 2013-12-31 NOTE — Assessment & Plan Note (Signed)
Multivessel CAD status post prior CABG, bypass grafts found to be patent at recent cardiac catheterization in the setting of NSTEMI. He is not reporting any active angina, and continues on DAPT for now.

## 2013-12-31 NOTE — Assessment & Plan Note (Signed)
Have discussed the critical importance of smoking cessation.

## 2013-12-31 NOTE — Assessment & Plan Note (Signed)
On Crestor, recent followup lab work noted.

## 2014-01-12 ENCOUNTER — Other Ambulatory Visit (INDEPENDENT_AMBULATORY_CARE_PROVIDER_SITE_OTHER): Payer: Medicare Other

## 2014-01-12 DIAGNOSIS — I2510 Atherosclerotic heart disease of native coronary artery without angina pectoris: Secondary | ICD-10-CM | POA: Diagnosis not present

## 2014-01-12 DIAGNOSIS — I428 Other cardiomyopathies: Secondary | ICD-10-CM | POA: Diagnosis not present

## 2014-01-12 DIAGNOSIS — I429 Cardiomyopathy, unspecified: Secondary | ICD-10-CM

## 2014-01-13 ENCOUNTER — Telehealth: Payer: Self-pay | Admitting: *Deleted

## 2014-01-13 DIAGNOSIS — I429 Cardiomyopathy, unspecified: Secondary | ICD-10-CM

## 2014-01-13 DIAGNOSIS — I2510 Atherosclerotic heart disease of native coronary artery without angina pectoris: Secondary | ICD-10-CM

## 2014-01-13 NOTE — Telephone Encounter (Signed)
Patient informed. 

## 2014-01-13 NOTE — Telephone Encounter (Signed)
Message copied by Merlene Laughter on Thu Jan 13, 2014  4:21 PM ------      Message from: MCDOWELL, Aloha Gell      Created: Thu Jan 13, 2014  8:03 AM       Reviewed report. Very limited study with inadequate visualization of LV. If patient agreeable, please schedule an echocardiogram with contrast as an outpatient at [facility] for better visualization of LV and assessment of LVEF. ------

## 2014-01-14 ENCOUNTER — Encounter (HOSPITAL_COMMUNITY): Payer: Medicare Other

## 2014-01-17 ENCOUNTER — Encounter: Payer: Self-pay | Admitting: *Deleted

## 2014-01-17 NOTE — Telephone Encounter (Signed)
This encounter was created in error - please disregard.

## 2014-01-19 ENCOUNTER — Encounter (HOSPITAL_COMMUNITY): Payer: Medicare Other

## 2014-01-20 ENCOUNTER — Ambulatory Visit (HOSPITAL_COMMUNITY)
Admission: RE | Admit: 2014-01-20 | Discharge: 2014-01-20 | Disposition: A | Discharge status: Home / Self Care | Payer: Medicare Other | Source: Ambulatory Visit | Attending: Cardiology | Admitting: Cardiology

## 2014-01-20 DIAGNOSIS — I2510 Atherosclerotic heart disease of native coronary artery without angina pectoris: Secondary | ICD-10-CM | POA: Diagnosis not present

## 2014-01-20 DIAGNOSIS — I429 Cardiomyopathy, unspecified: Secondary | ICD-10-CM | POA: Diagnosis not present

## 2014-01-20 DIAGNOSIS — I359 Nonrheumatic aortic valve disorder, unspecified: Secondary | ICD-10-CM

## 2014-01-20 MED ORDER — PERFLUTREN LIPID MICROSPHERE
1.0000 mL | INTRAVENOUS | Status: AC | PRN
  Administered 2014-01-20: 2 mL via INTRAVENOUS
  Administered 2014-01-20: 1 mL via INTRAVENOUS
  Administered 2014-01-20: 3 mL via INTRAVENOUS
  Filled 2014-01-20: qty 10

## 2014-01-20 NOTE — Progress Notes (Signed)
  Echocardiogram 2D Echocardiogram with Definity has been performed.  [name], [place] 01/20/2014, 12:31 PM

## 2014-01-25 ENCOUNTER — Telehealth: Payer: Self-pay | Admitting: *Deleted

## 2014-01-25 NOTE — Telephone Encounter (Signed)
Message copied by Merlene Laughter on Tue Jan 25, 2014  4:19 PM ------      Message from: Satira Sark      Created: Fri Jan 21, 2014  7:36 AM       Reviewed report. Contrast echocardiogram better defines his LV function, LVEF 45-50% which is better than previously assessed at ventriculography. Will continue medical therapy. ------

## 2014-01-27 NOTE — Telephone Encounter (Signed)
Patient informed. 

## 2014-02-02 ENCOUNTER — Ambulatory Visit (INDEPENDENT_AMBULATORY_CARE_PROVIDER_SITE_OTHER): Payer: Medicare Other | Admitting: Cardiology

## 2014-02-02 ENCOUNTER — Encounter: Payer: Self-pay | Admitting: Cardiology

## 2014-02-02 VITALS — BP 137/81 | HR 80 | Ht 71.0 in | Wt 262.0 lb

## 2014-02-02 DIAGNOSIS — I2510 Atherosclerotic heart disease of native coronary artery without angina pectoris: Secondary | ICD-10-CM

## 2014-02-02 DIAGNOSIS — I429 Cardiomyopathy, unspecified: Secondary | ICD-10-CM | POA: Diagnosis not present

## 2014-02-02 DIAGNOSIS — I10 Essential (primary) hypertension: Secondary | ICD-10-CM | POA: Diagnosis not present

## 2014-02-02 NOTE — Assessment & Plan Note (Signed)
LVEF in the range of 45-50% by recent followup echocardiogram. Plan to continue medical therapy.

## 2014-02-02 NOTE — Progress Notes (Signed)
Clinical Summary Mr. Felmlee is a medically complex 48 y.o.male last seen in June. He is here with his niece. He reports one spell where he felt nauseous since last visit, no recurring chest pain or regular use of nitroglycerin.  Followup contrast echocardiogram done in July showed LVEF in the range of 45-50% with mild LV chamber dilatation and grade 2 diastolic dysfunction. Wall motion abnormalities were noted consistent with ischemic heart disease. This indicates improvement compared to assessment back in April. I reviewed this with him today.  Most recent cardiac catheterization done back in April of this year in the setting of medication noncompliance and NSTEMI demonstrated severe multivessel disease with patency of the LIMA to LAD, SVG to first diagonal, SVG to OM1 and OM 2, and SVG to PDA.   He reports compliance with his medications. We discussed diet, walking regimen for exercise, weight loss.   Allergies  Allergen Reactions  . Contrast Media [Iodinated Diagnostic Agents] Anaphylaxis, Shortness Of Breath, Swelling and Rash    Isovue contrast is most acceptable agent based on previous experience and testing with premedications  . Ibuprofen Anaphylaxis, Hives and Swelling  . Nsaids Anaphylaxis, Hives, Swelling and Rash    Current Outpatient Prescriptions  Medication Sig Dispense Refill  . acetaminophen (TYLENOL) 500 MG tablet Take 500 mg by mouth every 6 (six) hours as needed for mild pain, moderate pain or headache.      . albuterol (PROVENTIL HFA;VENTOLIN HFA) 108 (90 BASE) MCG/ACT inhaler Inhale 2 puffs into the lungs every 6 (six) hours as needed for wheezing or shortness of breath.      . ALPRAZolam (XANAX) 1 MG tablet Take 1 mg by mouth 4 (four) times daily as needed for anxiety.       [name] amLODipine (NORVASC) 10 MG tablet Take 10 mg by mouth daily.      [name] aspirin EC 325 MG tablet Take 325 mg by mouth daily.      . carvedilol (COREG) 25 MG tablet Take 1 tablet (25 mg total)  by mouth 2 (two) times daily with a meal.  60 tablet  6  . cloNIDine (CATAPRES) 0.1 MG tablet Take 1 tablet (0.1 mg total) by mouth daily as needed (*Only taken for blood pressure levels that are >170/100*).  60 tablet  1  . clopidogrel (PLAVIX) 75 MG tablet Take 1 tablet (75 mg total) by mouth daily with breakfast.  30 tablet  11  . DULoxetine (CYMBALTA) 60 MG capsule Take 60 mg by mouth daily.      . isosorbide mononitrate (IMDUR) 60 MG 24 hr tablet Take 60 mg by mouth 2 (two) times daily.       [name] losartan-hydrochlorothiazide (HYZAAR) 100-25 MG per tablet Take 1 tablet by mouth daily.  30 tablet  6  . nitroGLYCERIN (NITROSTAT) 0.4 MG SL tablet Place 0.4 mg under the tongue every 5 (five) minutes as needed. For chest pain      . rosuvastatin (CRESTOR) 40 MG tablet Take 40 mg by mouth daily.      . traMADol (ULTRAM) 50 MG tablet Take 50 mg by mouth every 6 (six) hours as needed. For pain      . UNKNOWN TO PATIENT Apply 1 application topically daily as needed (for psoriasis).       No current facility-administered medications for this visit.    Past Medical History  Diagnosis Date  . Mixed hyperlipidemia   . Essential hypertension, benign   . COPD (chronic obstructive  pulmonary disease)   . Depression   . Anaphylaxis     IGE mediated  . Myocardial infarction 2010  . Anxiety   . Arthritis   . Type 2 diabetes mellitus   . OSA (obstructive sleep apnea)   . History of CVA (cerebrovascular accident) 01/2012    Right posterior frontal cortical and subcortical brain by MRI, no hemorrhage. Carotid Dopplers showed only 1-50% bilateral ICA stenoses. Echocardiogram showed LVEF 50%, no major valvular abnormalities.  . Coronary atherosclerosis of native coronary artery     Multivessel status post CABG 2010  . Cardiomyopathy     Past Surgical History  Procedure Laterality Date  . Gastric bypass  2010  . Hernia repair  2011, 2012  . Toe amputation  1998    right 1st and 2nd toe  .  Tonsilectomy, adenoidectomy, bilateral myringotomy and tubes    . Dental surgery  2003  . Cholecystectomy    . Coronary artery bypass graft  2010    LIMA to LAD, SVG to diagonal, SVG to OM1 and OM 2, SVG to RCA  . Tonsillectomy    . Ventral hernia repair N/A 10/28/2012    Procedure: LAPAROSCOPIC VENTRAL HERNIA;  Surgeon: Donato Heinz, MD;  Location: [facility];  Service: General;  Laterality: N/A;  . Coronary angioplasty  2008  . Incisional hernia repair N/A 07/23/2013    Procedure: HERNIA REPAIR INCISIONAL WITH MESH;  Surgeon: Jamesetta So, MD;  Location: [facility];  Service: General;  Laterality: N/A;  . Insertion of mesh N/A 07/23/2013    Procedure: INSERTION OF MESH;  Surgeon: Jamesetta So, MD;  Location: [facility];  Service: General;  Laterality: N/A;    Social History Mr. Kohls reports that he has been smoking Cigarettes.  He has a 30 pack-year smoking history. He has never used smokeless tobacco. Mr. Ramones reports that he drinks alcohol.  Review of Systems No palpitations, dizziness, syncope. Other systems reviewed and negative except as outlined.  Physical Examination Filed Vitals:   02/02/14 1133  BP: 137/81  Pulse: 80   Filed Weights   02/02/14 1133  Weight: 262 lb (118.842 kg)    Overweight male, appears comfortable at rest.  HEENT: Conjunctiva and lids normal, oropharynx clear.  Neck: Supple, no elevated JVP or carotid bruits, no thyromegaly.  Lungs: Clear to auscultation, nonlabored breathing at rest.  Cardiac: Regular rate and rhythm, no S3, soft systolic murmur, no pericardial rub.  Abdomen: Soft, nontender, bowel sounds present, no guarding or rebound.  Extremities: No pitting edema, distal pulses 2+.  Skin: Warm and dry.  Musculoskeletal: No kyphosis.  Neuropsychiatric: Alert and oriented x3, affect grossly appropriate.   Problem List and Plan   Coronary atherosclerosis of native coronary artery Symptomatically stable on medical regimen without active  angina. Most recent cardiac catheterization in April of this year showed patent bypass grafts.  Secondary cardiomyopathy LVEF in the range of 45-50% by recent followup echocardiogram. Plan to continue medical therapy.  Essential hypertension, benign No change to current regimen.    Satira Sark, M.D., F.A.C.C.

## 2014-02-02 NOTE — Assessment & Plan Note (Signed)
Symptomatically stable on medical regimen without active angina. Most recent cardiac catheterization in April of this year showed patent bypass grafts.

## 2014-02-02 NOTE — Assessment & Plan Note (Signed)
No change to current regimen. 

## 2014-02-02 NOTE — Patient Instructions (Signed)
Continue all current medications. Your physician wants you to follow up in:  3 months.  You will receive a reminder letter in the mail one-two months in advance.  If you don't receive a letter, please call our office to schedule the follow up appointment

## 2014-02-22 ENCOUNTER — Ambulatory Visit: Payer: Medicare Other | Admitting: Cardiology

## 2014-03-11 ENCOUNTER — Telehealth: Payer: Self-pay | Admitting: *Deleted

## 2014-03-11 MED ORDER — CLONIDINE HCL 0.1 MG PO TABS: 0.1000 mg | ORAL_TABLET | Freq: Every day | ORAL | Status: DC | PRN

## 2014-03-11 NOTE — Telephone Encounter (Signed)
Refill complete 

## 2014-03-11 NOTE — Telephone Encounter (Signed)
Fax [phone] For pt to get refill on clonidine hcl 0.1 mg #60

## 2014-04-05 DIAGNOSIS — E782 Mixed hyperlipidemia: Secondary | ICD-10-CM | POA: Diagnosis not present

## 2014-04-05 DIAGNOSIS — I6789 Other cerebrovascular disease: Secondary | ICD-10-CM | POA: Diagnosis not present

## 2014-04-05 DIAGNOSIS — E119 Type 2 diabetes mellitus without complications: Secondary | ICD-10-CM | POA: Diagnosis not present

## 2014-04-05 DIAGNOSIS — I10 Essential (primary) hypertension: Secondary | ICD-10-CM | POA: Diagnosis not present

## 2014-04-12 DIAGNOSIS — I10 Essential (primary) hypertension: Secondary | ICD-10-CM | POA: Diagnosis not present

## 2014-04-12 DIAGNOSIS — F331 Major depressive disorder, recurrent, moderate: Secondary | ICD-10-CM | POA: Diagnosis not present

## 2014-04-12 DIAGNOSIS — J45909 Unspecified asthma, uncomplicated: Secondary | ICD-10-CM | POA: Diagnosis not present

## 2014-04-12 DIAGNOSIS — F17200 Nicotine dependence, unspecified, uncomplicated: Secondary | ICD-10-CM | POA: Diagnosis not present

## 2014-04-12 DIAGNOSIS — E119 Type 2 diabetes mellitus without complications: Secondary | ICD-10-CM | POA: Diagnosis not present

## 2014-04-12 DIAGNOSIS — F410 Panic disorder [episodic paroxysmal anxiety] without agoraphobia: Secondary | ICD-10-CM | POA: Diagnosis not present

## 2014-04-12 DIAGNOSIS — E782 Mixed hyperlipidemia: Secondary | ICD-10-CM | POA: Diagnosis not present

## 2014-04-27 DIAGNOSIS — Z79899 Other long term (current) drug therapy: Secondary | ICD-10-CM | POA: Diagnosis not present

## 2014-04-27 DIAGNOSIS — Z7902 Long term (current) use of antithrombotics/antiplatelets: Secondary | ICD-10-CM | POA: Diagnosis not present

## 2014-04-27 DIAGNOSIS — I2510 Atherosclerotic heart disease of native coronary artery without angina pectoris: Secondary | ICD-10-CM | POA: Diagnosis not present

## 2014-04-27 DIAGNOSIS — I10 Essential (primary) hypertension: Secondary | ICD-10-CM | POA: Diagnosis not present

## 2014-04-27 DIAGNOSIS — E119 Type 2 diabetes mellitus without complications: Secondary | ICD-10-CM | POA: Diagnosis not present

## 2014-04-27 DIAGNOSIS — R0989 Other specified symptoms and signs involving the circulatory and respiratory systems: Secondary | ICD-10-CM | POA: Diagnosis not present

## 2014-04-27 DIAGNOSIS — I214 Non-ST elevation (NSTEMI) myocardial infarction: Secondary | ICD-10-CM | POA: Diagnosis not present

## 2014-04-27 DIAGNOSIS — Z7982 Long term (current) use of aspirin: Secondary | ICD-10-CM | POA: Diagnosis not present

## 2014-04-27 DIAGNOSIS — I509 Heart failure, unspecified: Secondary | ICD-10-CM | POA: Diagnosis not present

## 2014-04-27 DIAGNOSIS — R05 Cough: Secondary | ICD-10-CM | POA: Diagnosis not present

## 2014-04-27 DIAGNOSIS — Z91041 Radiographic dye allergy status: Secondary | ICD-10-CM | POA: Diagnosis not present

## 2014-04-27 DIAGNOSIS — F17200 Nicotine dependence, unspecified, uncomplicated: Secondary | ICD-10-CM | POA: Diagnosis not present

## 2014-04-27 DIAGNOSIS — I517 Cardiomegaly: Secondary | ICD-10-CM | POA: Diagnosis not present

## 2014-04-27 DIAGNOSIS — J440 Chronic obstructive pulmonary disease with acute lower respiratory infection: Secondary | ICD-10-CM | POA: Diagnosis not present

## 2014-04-27 DIAGNOSIS — I213 ST elevation (STEMI) myocardial infarction of unspecified site: Secondary | ICD-10-CM | POA: Diagnosis not present

## 2014-04-27 DIAGNOSIS — J209 Acute bronchitis, unspecified: Secondary | ICD-10-CM | POA: Diagnosis not present

## 2014-04-27 DIAGNOSIS — Z951 Presence of aortocoronary bypass graft: Secondary | ICD-10-CM | POA: Diagnosis not present

## 2014-04-27 DIAGNOSIS — Z886 Allergy status to analgesic agent status: Secondary | ICD-10-CM | POA: Diagnosis not present

## 2014-04-28 ENCOUNTER — Encounter (HOSPITAL_COMMUNITY): Payer: Self-pay | Admitting: *Deleted

## 2014-04-28 ENCOUNTER — Encounter: Payer: Medicare Other | Admitting: Cardiology

## 2014-04-28 ENCOUNTER — Encounter: Payer: Self-pay | Admitting: Cardiology

## 2014-04-28 ENCOUNTER — Inpatient Hospital Stay (HOSPITAL_COMMUNITY)
Admission: AD | Admit: 2014-04-28 | Discharge: 2014-04-28 | DRG: 204 | Disposition: A | Discharge status: Home / Self Care | Payer: Medicare Other | Source: Other Acute Inpatient Hospital | Attending: Internal Medicine | Admitting: Internal Medicine

## 2014-04-28 ENCOUNTER — Inpatient Hospital Stay (HOSPITAL_COMMUNITY): Payer: Medicare Other

## 2014-04-28 DIAGNOSIS — Z7902 Long term (current) use of antithrombotics/antiplatelets: Secondary | ICD-10-CM | POA: Diagnosis not present

## 2014-04-28 DIAGNOSIS — E669 Obesity, unspecified: Secondary | ICD-10-CM | POA: Diagnosis present

## 2014-04-28 DIAGNOSIS — I252 Old myocardial infarction: Secondary | ICD-10-CM

## 2014-04-28 DIAGNOSIS — E782 Mixed hyperlipidemia: Secondary | ICD-10-CM | POA: Diagnosis present

## 2014-04-28 DIAGNOSIS — G4733 Obstructive sleep apnea (adult) (pediatric): Secondary | ICD-10-CM | POA: Diagnosis present

## 2014-04-28 DIAGNOSIS — F418 Other specified anxiety disorders: Secondary | ICD-10-CM | POA: Diagnosis present

## 2014-04-28 DIAGNOSIS — I255 Ischemic cardiomyopathy: Secondary | ICD-10-CM | POA: Diagnosis present

## 2014-04-28 DIAGNOSIS — F17210 Nicotine dependence, cigarettes, uncomplicated: Secondary | ICD-10-CM | POA: Diagnosis present

## 2014-04-28 DIAGNOSIS — I2510 Atherosclerotic heart disease of native coronary artery without angina pectoris: Secondary | ICD-10-CM | POA: Diagnosis not present

## 2014-04-28 DIAGNOSIS — R778 Other specified abnormalities of plasma proteins: Secondary | ICD-10-CM

## 2014-04-28 DIAGNOSIS — R0781 Pleurodynia: Secondary | ICD-10-CM | POA: Diagnosis not present

## 2014-04-28 DIAGNOSIS — R748 Abnormal levels of other serum enzymes: Secondary | ICD-10-CM | POA: Diagnosis not present

## 2014-04-28 DIAGNOSIS — I214 Non-ST elevation (NSTEMI) myocardial infarction: Secondary | ICD-10-CM | POA: Diagnosis not present

## 2014-04-28 DIAGNOSIS — Z91041 Radiographic dye allergy status: Secondary | ICD-10-CM

## 2014-04-28 DIAGNOSIS — Z951 Presence of aortocoronary bypass graft: Secondary | ICD-10-CM | POA: Diagnosis not present

## 2014-04-28 DIAGNOSIS — Z9861 Coronary angioplasty status: Secondary | ICD-10-CM

## 2014-04-28 DIAGNOSIS — Z8673 Personal history of transient ischemic attack (TIA), and cerebral infarction without residual deficits: Secondary | ICD-10-CM | POA: Diagnosis not present

## 2014-04-28 DIAGNOSIS — E119 Type 2 diabetes mellitus without complications: Secondary | ICD-10-CM | POA: Diagnosis present

## 2014-04-28 DIAGNOSIS — R7989 Other specified abnormal findings of blood chemistry: Secondary | ICD-10-CM

## 2014-04-28 DIAGNOSIS — J209 Acute bronchitis, unspecified: Secondary | ICD-10-CM | POA: Diagnosis not present

## 2014-04-28 DIAGNOSIS — Z7982 Long term (current) use of aspirin: Secondary | ICD-10-CM | POA: Diagnosis not present

## 2014-04-28 DIAGNOSIS — J449 Chronic obstructive pulmonary disease, unspecified: Secondary | ICD-10-CM | POA: Diagnosis present

## 2014-04-28 DIAGNOSIS — Z6834 Body mass index (BMI) 34.0-34.9, adult: Secondary | ICD-10-CM

## 2014-04-28 DIAGNOSIS — I10 Essential (primary) hypertension: Secondary | ICD-10-CM | POA: Diagnosis not present

## 2014-04-28 DIAGNOSIS — R079 Chest pain, unspecified: Secondary | ICD-10-CM

## 2014-04-28 DIAGNOSIS — E785 Hyperlipidemia, unspecified: Secondary | ICD-10-CM

## 2014-04-28 DIAGNOSIS — F17200 Nicotine dependence, unspecified, uncomplicated: Secondary | ICD-10-CM

## 2014-04-28 DIAGNOSIS — I509 Heart failure, unspecified: Secondary | ICD-10-CM | POA: Diagnosis not present

## 2014-04-28 DIAGNOSIS — J440 Chronic obstructive pulmonary disease with acute lower respiratory infection: Secondary | ICD-10-CM | POA: Diagnosis not present

## 2014-04-28 DIAGNOSIS — F17220 Nicotine dependence, chewing tobacco, uncomplicated: Secondary | ICD-10-CM | POA: Diagnosis not present

## 2014-04-28 LAB — GLUCOSE, CAPILLARY
Glucose-Capillary: 105 mg/dL — ABNORMAL HIGH (ref 70–99)
Glucose-Capillary: 126 mg/dL — ABNORMAL HIGH (ref 70–99)
Glucose-Capillary: 119 mg/dL — ABNORMAL HIGH (ref 70–99)

## 2014-04-28 LAB — BASIC METABOLIC PANEL
Anion gap: 13 (ref 5–15)
BUN: 16 mg/dL (ref 6–23)
CO2: 22 mEq/L (ref 19–32)
Calcium: 8.9 mg/dL (ref 8.4–10.5)
Chloride: 104 mEq/L (ref 96–112)
Creatinine, Ser: 1.04 mg/dL (ref 0.50–1.35)
GFR calc Af Amer: >90 mL/min (ref >90)
GFR calc non Af Amer: 85 mL/min — ABNORMAL LOW (ref >90)
Glucose, Bld: 113 mg/dL — ABNORMAL HIGH (ref 70–99)
Potassium: 3.9 mEq/L (ref 3.7–5.3)
Sodium: 139 mEq/L (ref 137–147)

## 2014-04-28 LAB — CBC WITH DIFFERENTIAL/PLATELET
Basophils Absolute: 0.1 10*3/uL (ref 0.0–0.1)
Basophils Relative: 1 % (ref 0–1)
Eosinophils Absolute: 0.2 10*3/uL (ref 0.0–0.7)
Eosinophils Relative: 2 % (ref 0–5)
HCT: 43.2 % (ref 39.0–52.0)
Hemoglobin: 14.6 g/dL (ref 13.0–17.0)
Lymphocytes Relative: 28 % (ref 12–46)
Lymphs Abs: 3 10*3/uL (ref 0.7–4.0)
MCH: 29.5 pg (ref 26.0–34.0)
MCHC: 33.8 g/dL (ref 30.0–36.0)
MCV: 87.3 fL (ref 78.0–100.0)
Monocytes Absolute: 1 10*3/uL (ref 0.1–1.0)
Monocytes Relative: 9 % (ref 3–12)
Neutro Abs: 6.4 10*3/uL (ref 1.7–7.7)
Neutrophils Relative %: 60 % (ref 43–77)
Platelets: 179 10*3/uL (ref 150–400)
RBC: 4.95 MIL/uL (ref 4.22–5.81)
RDW: 13.1 % (ref 11.5–15.5)
WBC: 10.7 10*3/uL — ABNORMAL HIGH (ref 4.0–10.5)

## 2014-04-28 LAB — MRSA PCR SCREENING: MRSA by PCR: NEGATIVE

## 2014-04-28 LAB — TROPONIN I
Troponin I: 6.15 ng/mL (ref <0.30)
Troponin I: 4.64 ng/mL (ref <0.30)

## 2014-04-28 MED ORDER — CLOPIDOGREL BISULFATE 75 MG PO TABS
300.0000 mg | ORAL_TABLET | Freq: Once | ORAL | Status: AC
  Administered 2014-04-28: 300 mg via ORAL
  Filled 2014-04-28: qty 4

## 2014-04-28 MED ORDER — CLOPIDOGREL BISULFATE 75 MG PO TABS: 75.0000 mg | ORAL_TABLET | Freq: Every day | ORAL | Status: DC

## 2014-04-28 MED ORDER — CLONIDINE HCL 0.1 MG PO TABS
0.1000 mg | ORAL_TABLET | Freq: Every day | ORAL | Status: DC | PRN
  Filled 2014-04-28: qty 1

## 2014-04-28 MED ORDER — TRAMADOL HCL 50 MG PO TABS: 50.0000 mg | ORAL_TABLET | Freq: Four times a day (QID) | ORAL | Status: DC | PRN

## 2014-04-28 MED ORDER — ASPIRIN EC 81 MG PO TBEC: 81.0000 mg | DELAYED_RELEASE_TABLET | Freq: Every day | ORAL | Status: DC

## 2014-04-28 MED ORDER — DULOXETINE HCL 60 MG PO CPEP
60.0000 mg | ORAL_CAPSULE | Freq: Every day | ORAL | Status: DC
  Administered 2014-04-28: 11:00:00 60 mg via ORAL
  Filled 2014-04-28: qty 1

## 2014-04-28 MED ORDER — DIPHENHYDRAMINE HCL 50 MG/ML IJ SOLN
50.0000 mg | Freq: Once | INTRAMUSCULAR | Status: DC | PRN
Start: 2014-04-28 — End: 2014-04-28

## 2014-04-28 MED ORDER — HYDROCHLOROTHIAZIDE 25 MG PO TABS
25.0000 mg | ORAL_TABLET | Freq: Every day | ORAL | Status: DC
  Administered 2014-04-28: 25 mg via ORAL
  Filled 2014-04-28: qty 1

## 2014-04-28 MED ORDER — TECHNETIUM TO 99M ALBUMIN AGGREGATED: 6.0000 | Freq: Once | INTRAVENOUS | Status: DC | PRN

## 2014-04-28 MED ORDER — ALPRAZOLAM 0.5 MG PO TABS: 1.0000 mg | ORAL_TABLET | Freq: Four times a day (QID) | ORAL | Status: DC | PRN

## 2014-04-28 MED ORDER — ROSUVASTATIN CALCIUM 40 MG PO TABS
40.0000 mg | ORAL_TABLET | Freq: Every day | ORAL | Status: DC
  Administered 2014-04-28: 40 mg via ORAL
  Filled 2014-04-28: qty 1

## 2014-04-28 MED ORDER — METHYLPREDNISOLONE SODIUM SUCC 40 MG IJ SOLR
40.0000 mg | Freq: Once | INTRAMUSCULAR | Status: AC
  Administered 2014-04-28: 40 mg via INTRAVENOUS
  Filled 2014-04-28: qty 1

## 2014-04-28 MED ORDER — LOSARTAN POTASSIUM 50 MG PO TABS
100.0000 mg | ORAL_TABLET | Freq: Every day | ORAL | Status: DC
  Administered 2014-04-28: 12:00:00 100 mg via ORAL
  Filled 2014-04-28 (×2): qty 2

## 2014-04-28 MED ORDER — ISOSORBIDE MONONITRATE ER 60 MG PO TB24
60.0000 mg | ORAL_TABLET | Freq: Two times a day (BID) | ORAL | Status: DC
  Administered 2014-04-28: 60 mg via ORAL
  Filled 2014-04-28 (×2): qty 1

## 2014-04-28 MED ORDER — AMLODIPINE BESYLATE 10 MG PO TABS
10.0000 mg | ORAL_TABLET | Freq: Every day | ORAL | Status: DC
  Administered 2014-04-28: 11:00:00 10 mg via ORAL
  Filled 2014-04-28: qty 1

## 2014-04-28 MED ORDER — PNEUMOCOCCAL VAC POLYVALENT 25 MCG/0.5ML IJ INJ: 0.5000 mL | INJECTION | INTRAMUSCULAR | Status: DC

## 2014-04-28 MED ORDER — CARVEDILOL 25 MG PO TABS
25.0000 mg | ORAL_TABLET | Freq: Two times a day (BID) | ORAL | Status: DC
  Administered 2014-04-28 (×2): 25 mg via ORAL
  Filled 2014-04-28 (×3): qty 1

## 2014-04-28 MED ORDER — ACETAMINOPHEN 325 MG PO TABS: 650.0000 mg | ORAL_TABLET | ORAL | Status: DC | PRN

## 2014-04-28 MED ORDER — INFLUENZA VAC SPLIT QUAD 0.5 ML IM SUSY: 0.5000 mL | PREFILLED_SYRINGE | INTRAMUSCULAR | Status: DC

## 2014-04-28 MED ORDER — FUROSEMIDE 10 MG/ML IJ SOLN
20.0000 mg | Freq: Once | INTRAMUSCULAR | Status: AC
  Administered 2014-04-28: 20 mg via INTRAVENOUS

## 2014-04-28 MED ORDER — ALBUTEROL SULFATE (2.5 MG/3ML) 0.083% IN NEBU: 3.0000 mL | INHALATION_SOLUTION | Freq: Four times a day (QID) | RESPIRATORY_TRACT | Status: DC | PRN

## 2014-04-28 MED ORDER — LOSARTAN POTASSIUM-HCTZ 100-25 MG PO TABS: 1.0000 | ORAL_TABLET | Freq: Every day | ORAL | Status: DC

## 2014-04-28 MED ORDER — TECHNETIUM TC 99M DIETHYLENETRIAME-PENTAACETIC ACID: 40.0000 | Freq: Once | INTRAVENOUS | Status: DC | PRN

## 2014-04-28 MED ORDER — FUROSEMIDE 10 MG/ML IJ SOLN
INTRAMUSCULAR | Status: AC
  Administered 2014-04-28: 11:00:00 10 mg
  Filled 2014-04-28: qty 4

## 2014-04-28 MED ORDER — ONDANSETRON HCL 4 MG/2ML IJ SOLN: 4.0000 mg | Freq: Four times a day (QID) | INTRAMUSCULAR | Status: DC | PRN

## 2014-04-28 MED ORDER — NITROGLYCERIN 0.4 MG SL SUBL: 0.4000 mg | SUBLINGUAL_TABLET | SUBLINGUAL | Status: DC | PRN

## 2014-04-28 NOTE — Progress Notes (Signed)
    Left posterior subscapular pain with deep breath. No anterior chest pain. No significant SOB. Felt like he had "walking PNA". Fever 101 transiently at home.  In 4/15 - had cath in the setting of atypical CP, elevated troponin similar to this and thankfully all grafts were patent. Med MGT.   D-Dimer elevated. LE Doppler normal.  I will check V/Q scan.  Had mild crackles in space where he has pleuritic discomfort.   Challenging situation. It would not be unreasonable to once again perform cardiac cath with ISOVUE due to severe dye allergy but situational pleuritic CP seems quite atypical once again as it did in April and excluding PE with VQ seems to make more clinical sense in this setting. ?elevated troponin from PE, ?from myocardial inflammation/pericarditis (ECG not suggestive however) given pleuritic component. ?once again small vessel occlusion as was possibly speculated in April.   Spoke with his wife. [place], [phone]. She is in agreement with plan. She reiterated to me that he has had about 20 heart caths since they have been married.   He is eager to be home on Saturday for his daughter, [place], birthday 23.   30 min spent with patient, data, phone call.   Candee Furbish, MD

## 2014-04-28 NOTE — Progress Notes (Signed)
Visit canceled - patient in hospital.  This encounter was created in error - please disregard.

## 2014-04-28 NOTE — Care Management Note (Addendum)
  Page 1 of 1   04/28/2014     11:00:02 AM CARE MANAGEMENT NOTE 04/28/2014  Patient:  BISMARCK, ROUTH   Account Number:  [id number]  Date Initiated:  04/28/2014  Documentation initiated by:  Mariann Laster  Subjective/Objective Assessment:   N-STEMI     Action/Plan:   CM to follow for disposition needs   Anticipated DC Date:  04/30/2014   Anticipated DC Plan:  HOME/SELF CARE         Choice offered to / List presented to:             Status of service:  Completed, signed off Medicare Important Message given?   (If response is "NO", the following Medicare IM given date fields will be blank) Date Medicare IM given:   Medicare IM given by:   Date Additional Medicare IM given:   Additional Medicare IM given by:    Discharge Disposition:  HOME/SELF CARE  Per UR Regulation:  Reviewed for med. necessity/level of care/duration of stay  If discussed at [place] of Stay Meetings, dates discussed:    Comments:  Rumaisa Schnetzer RN, BSN, MSHL, CCM  Nurse - Case Manager,  (Unit [phone]  04/28/2014 Med Review:  Plavix CM will continue to monitor for d/c plannine needs

## 2014-04-28 NOTE — Progress Notes (Signed)
UR completed Mabrey Howland K. Tenika Keeran, RN, BSN, MSHL, CCM  04/28/2014 10:44 AM

## 2014-04-28 NOTE — Progress Notes (Signed)
CRITICAL VALUE ALERT  Critical value received:  Troponin 6.15  Date of notification:  04/28/14  Time of notification:  0815   Critical value read back:Yes.    Nurse who received alert:  Chales Salmon, RN  MD notified (1st page):  Justice Britain, PA  Time of first page:  0830  MD notified (2nd page):  Time of second page:  Responding MD:  Justice Britain, PA  Time MD responded:  0830

## 2014-04-28 NOTE — Discharge Summary (Signed)
Physician Discharge Summary  Patient ID: [name] MRN: [id number] DOB/AGE: January 25, 1966 48 y.o.  Admit date: 04/28/2014 Discharge date: 04/28/2014  Primary Cardiologist: Dr. Domenic Polite  Admission Diagnoses: Elevated Troponin   Discharge Diagnoses:  Left pleuritic scapular pain  Discharged Condition: stable  Hospital Course: The patient is a 48 y.o. 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  His last LHC was in the spring of 2014 in the setting of med noncompliance and a NSTEMI demonstratin severe multivessel disease with patency of the LIMA to LAD, SVG to first diagonal, SVG to OM1 and OM 2, and SVG to PDA. He also has a history of severe allergy to contrast.   He presented to [facility] on 04/28/2014 after being transferred from [facility]. He presented with complaints of left posterior subscapular pain with sensation of congestion and back pain with deep breathing. Upon presentation, he thought it was "walking pneumonia" as he had been congested and slightly feverish at home, spiking a temperature of 101 at home the day before. He endorsed a cough but nonproductive. He denied anterior chest pain.   Labs were notable for elevated troponins x 3 (8.42, 6.15 and 4.64). D-dimer was elevated at 0.9. CXR demonstrated cardiomegaly and possible mild pulmonary edema. EKG without acute changes.   Given his atypical symptoms and pleuritic chest pain, it was decided to r/o for PE. He was evaluated by V/Q scan which was low risk. He was monitored on telemetry. No cardiac arrhthymias were detected. His HR and BP remained stable. His symptoms improved. He continued to deny any anterior chest pain. The results of his V/Q scan were reported to Dr. Marlou Porch. It was advised that he stay overnight for further telemetry monitoring, however the patient was eager to go home for his daughter's 16th birthday. Given his atypical symptoms, spontaneous resolution of  symptoms, negative V/Q scan and lack of ventricular arrhthymias on telemetry, Dr. Marlou Porch felt that is was reasonable to discharge home. Close post-hospital follow-up with Dr. Domenic Polite will be arranged.    Consults: None  Significant Diagnostic Studies:   V/Q scan 04/28/14 EXAM:  NUCLEAR MEDICINE VENTILATION - PERFUSION LUNG SCAN  TECHNIQUE:  Ventilation images were obtained in multiple projections using  inhaled aerosol technetium 99 M DTPA. Perfusion images were obtained  in multiple projections after intravenous injection of Tc-21m MAA.  RADIOPHARMACEUTICALS: 40.0 MCi Tc-48m DTPA aerosol and 6.0 mCi  Tc-73m MAA  COMPARISON: Chest radiograph from previous day, demonstrating  cardiomegaly and mild edema  FINDINGS:  Ventilation: No focal ventilation defect. Moderate amount of  activity in the trachea and stomach.  Perfusion: No wedge shaped peripheral perfusion defects to suggest  acute pulmonary embolism.  IMPRESSION:  1. Low likelihood ratio for pulmonary embolism.    Treatments: See Hospital Course  Discharge Exam: Blood pressure 122/60, pulse 72, temperature 97.9 F (36.6 C), temperature source Oral, resp. rate 18, height 5\' 11"  (1.803 m), weight 249 lb 12.5 oz (113.3 kg), SpO2 96.00%.   Disposition: 01-Home or Self Care  Discharge Instructions   Diet - low sodium heart healthy    Complete by:  As directed      Increase activity slowly    Complete by:  As directed             Medication List         acetaminophen 500 MG tablet  Commonly known as:  TYLENOL  Take 1,000 mg by mouth every 6 (six)  hours as needed for mild pain, moderate pain or headache.     albuterol 108 (90 BASE) MCG/ACT inhaler  Commonly known as:  PROVENTIL HFA;VENTOLIN HFA  Inhale 2 puffs into the lungs every 6 (six) hours as needed for wheezing or shortness of breath.     ALPRAZolam 1 MG tablet  Commonly known as:  XANAX  Take 1 mg by mouth 4 (four) times daily as needed for anxiety.       amLODipine 10 MG tablet  Commonly known as:  NORVASC  Take 10 mg by mouth daily.     aspirin EC 325 MG tablet  Take 325 mg by mouth daily.     carvedilol 25 MG tablet  Commonly known as:  COREG  Take 1 tablet (25 mg total) by mouth 2 (two) times daily with a meal.     cloNIDine 0.1 MG tablet  Commonly known as:  CATAPRES  Take 1 tablet (0.1 mg total) by mouth daily as needed (*Only taken for blood pressure levels that are >170/100*).     clopidogrel 75 MG tablet  Commonly known as:  PLAVIX  Take 1 tablet (75 mg total) by mouth daily with breakfast.     DULoxetine 60 MG capsule  Commonly known as:  CYMBALTA  Take 60 mg by mouth daily.     isosorbide mononitrate 60 MG 24 hr tablet  Commonly known as:  IMDUR  Take 60 mg by mouth 2 (two) times daily.     losartan-hydrochlorothiazide 100-25 MG per tablet  Commonly known as:  HYZAAR  Take 1 tablet by mouth daily.     nitroGLYCERIN 0.4 MG SL tablet  Commonly known as:  NITROSTAT  Place 0.4 mg under the tongue every 5 (five) minutes as needed. For chest pain     rosuvastatin 40 MG tablet  Commonly known as:  CRESTOR  Take 40 mg by mouth daily.     traMADol 50 MG tablet  Commonly known as:  ULTRAM  Take 50 mg by mouth every 6 (six) hours as needed. For pain     UNKNOWN TO PATIENT  Apply 1 application topically daily as needed (for psoriasis).           Follow-up Information   Follow up with Rozann Lesches, MD. Schedule an appointment as soon as possible for a visit in 1 week.   Specialty:  Cardiology   Contact information:   [place] [address] [phone]      TIME SPENT ON DISCHARGE, INCLUDING PHYSICIAN TIME: >30 MINUTES  Signed: Lyda Jester 04/28/2014, 5:50 PM  Personally seen and examined. Agree with above. In 4/15 - had cath in the setting of atypical CP, elevated troponin similar to this and thankfully all grafts were patent. Med MGT.  D-Dimer elevated.  LE Doppler normal.  V/Q  scan low prob.  Pleuritic discomfort.  Challenging situation. It would not be unreasonable to once again perform cardiac cath with ISOVUE due to severe dye allergy but pleuritic CP seems quite atypical once again as it did in April and excluding PE with VQ was reassuring. seems to make more clinical sense in this setting. ?elevated troponin from myocardial inflammation/pericarditis (ECG not suggestive however) given pleuritic component. ?once again small vessel occlusion as was possibly speculated in April.  Spoke with his wife. [place], [place]. She is in agreement with plan. She reiterated to me that he has had about 20 heart caths since they have been married.  He is  eager to be home on Saturday for his daughter, [name], birthday 47. He understands that elevated troponin does predict worsened prognosis.

## 2014-04-28 NOTE — Progress Notes (Signed)
*  Preliminary Results* Bilateral lower extremity venous duplex completed. Bilateral lower extremities are negative for deep vein thrombosis. There is no evidence of Baker's cyst bilaterally.  04/28/2014  Maudry Mayhew, RVT, RDCS, RDMS

## 2014-04-28 NOTE — H&P (Signed)
History and Physical  Patient ID: [name] MRN: [id number], SOB: Nov 30, 1965 48 y.o. Date of Encounter: 04/28/2014, 5:44 AM  Primary Physician: Gar Ponto, MD Primary Cardiologist: Dr. Domenic Polite  Chief Complaint: cough, nasal congestion, chest pain  HPI: 48 y.o. male w/ PMHx significant for CAD s/p CABG, ischemic CMP with EF of 45% who presented to [facility] on 04/28/2014 with complaints of chest pain. Initially presented to [facility] with sensation of congestion and back pain with deep breathing. Upon presentation, he thought it was "walking pneumonia" as he has been congestion and slightly feverish at home, spiking a temperature of 101 at home the day before. Cough but nonproductive. Pain in his back with deep breath. No chest pain. In April, his symptoms were shoulder pain.  Reports a prior diagnosis of heart failure prior to CABG. Denies orthopnea, LE edema.  Still smoking.  At OSH, was given full dose lovenox due to elevated D-Dimer.   Outside labs: Cr. 1.25 NA 138 K 4.0 Trop 8.42 CPK 451 MB 73 WBC 11.6 Hct 46 Plt 162 BNP 194 D-dimer 0.9 INR 1  EKG with sinus, anterior q waves, TWI in V2-V6 which are old.  Craxy: cardiomegaly and possible mild pulmonary edema.  At last cath this past spring in the setting of med noncompliance and a NSTEMI demonstrated severe multivessel disease with patency of the LIMA to LAD, SVG to first diagonal, SVG to OM1 and OM 2, and SVG to PDA.     Past Medical History  Diagnosis Date  . Mixed hyperlipidemia   . Essential hypertension, benign   . COPD (chronic obstructive pulmonary disease)   . Depression   . Anaphylaxis     IGE mediated  . Myocardial infarction 2010  . Anxiety   . Arthritis   . Type 2 diabetes mellitus   . OSA (obstructive sleep apnea)   . History of CVA (cerebrovascular accident) 01/2012    Right posterior frontal cortical and subcortical brain by MRI, no hemorrhage. Carotid Dopplers  showed only 1-50% bilateral ICA stenoses. Echocardiogram showed LVEF 50%, no major valvular abnormalities.  . Coronary atherosclerosis of native coronary artery     Multivessel status post CABG 2010  . Cardiomyopathy      Surgical History:  Past Surgical History  Procedure Laterality Date  . Gastric bypass  2010  . Hernia repair  2011, 2012  . Toe amputation  1998    right 1st and 2nd toe  . Tonsilectomy, adenoidectomy, bilateral myringotomy and tubes    . Dental surgery  2003  . Cholecystectomy    . Coronary artery bypass graft  2010    LIMA to LAD, SVG to diagonal, SVG to OM1 and OM 2, SVG to RCA  . Tonsillectomy    . Ventral hernia repair N/A 10/28/2012    Procedure: LAPAROSCOPIC VENTRAL HERNIA;  Surgeon: Donato Heinz, MD;  Location: [facility];  Service: General;  Laterality: N/A;  . Coronary angioplasty  2008  . Incisional hernia repair N/A 07/23/2013    Procedure: HERNIA REPAIR INCISIONAL WITH MESH;  Surgeon: Jamesetta So, MD;  Location: [facility] ORS;  Service: General;  Laterality: N/A;  . Insertion of mesh N/A 07/23/2013    Procedure: INSERTION OF MESH;  Surgeon: Jamesetta So, MD;  Location: [facility];  Service: General;  Laterality: N/A;     Home Meds: Prior to Admission medications   Medication Sig Start Date End Date Taking? Authorizing Provider  acetaminophen (TYLENOL) 500 MG tablet  Take 500 mg by mouth every 6 (six) hours as needed for mild pain, moderate pain or headache.    Historical Provider, MD  albuterol (PROVENTIL HFA;VENTOLIN HFA) 108 (90 BASE) MCG/ACT inhaler Inhale 2 puffs into the lungs every 6 (six) hours as needed for wheezing or shortness of breath.    Historical Provider, MD  ALPRAZolam Duanne Moron) 1 MG tablet Take 1 mg by mouth 4 (four) times daily as needed for anxiety.     Historical Provider, MD  amLODipine (NORVASC) 10 MG tablet Take 10 mg by mouth daily.    Historical Provider, MD  aspirin EC 325 MG tablet Take 325 mg by mouth daily.    Historical Provider, MD   carvedilol (COREG) 25 MG tablet Take 1 tablet (25 mg total) by mouth 2 (two) times daily with a meal. 11/24/13   Lendon Colonel, NP  cloNIDine (CATAPRES) 0.1 MG tablet Take 1 tablet (0.1 mg total) by mouth daily as needed (*Only taken for blood pressure levels that are >170/100*). 03/11/14   Satira Sark, MD  clopidogrel (PLAVIX) 75 MG tablet Take 1 tablet (75 mg total) by mouth daily with breakfast. 11/02/13   Brett Canales, PA-C  DULoxetine (CYMBALTA) 60 MG capsule Take 60 mg by mouth daily.    Historical Provider, MD  isosorbide mononitrate (IMDUR) 60 MG 24 hr tablet Take 60 mg by mouth 2 (two) times daily.     Historical Provider, MD  losartan-hydrochlorothiazide (HYZAAR) 100-25 MG per tablet Take 1 tablet by mouth daily. 11/25/13   Lendon Colonel, NP  nitroGLYCERIN (NITROSTAT) 0.4 MG SL tablet Place 0.4 mg under the tongue every 5 (five) minutes as needed. For chest pain    Historical Provider, MD  rosuvastatin (CRESTOR) 40 MG tablet Take 40 mg by mouth daily.    Historical Provider, MD  traMADol (ULTRAM) 50 MG tablet Take 50 mg by mouth every 6 (six) hours as needed. For pain    Historical Provider, MD  UNKNOWN TO PATIENT Apply 1 application topically daily as needed (for psoriasis).    Historical Provider, MD    Allergies:  Allergies  Allergen Reactions  . Contrast Media [Iodinated Diagnostic Agents] Anaphylaxis, Shortness Of Breath, Swelling and Rash    Isovue contrast is most acceptable agent based on previous experience and testing with premedications  . Ibuprofen Anaphylaxis, Hives and Swelling  . Nsaids Anaphylaxis, Hives, Swelling and Rash    History   Social History  . Marital Status: Legally Separated    Spouse Name: N/A    Number of Children: N/A  . Years of Education: N/A   Occupational History  . Not on file.   Social History Main Topics  . Smoking status: Current Every Day Smoker -- 1.00 packs/day for 30 years    Types: Cigarettes  . Smokeless tobacco:  Never Used     Comment: trying to cut back   . Alcohol Use: Yes     Comment: rarely  . Drug Use: No  . Sexual Activity: Yes    Birth Control/ Protection: None   Other Topics Concern  . Not on file   Social History Narrative   Disabled since age 60 lives with wife and children     Family History  Problem Relation Age of Onset  . Breast cancer Maternal Aunt   . Lung cancer Father     Review of Systems: General: negative for chills, night sweats or weight changes. +fever Cardiovascular: see HPI Dermatological: negative for rash  Respiratory: +couigh Urologic: negative for hematuria Abdominal: negative for nausea, vomiting, diarrhea, bright red blood per rectum, melena, or hematemesis Neurologic: negative for visual changes, syncope, or dizziness All other systems reviewed and are otherwise negative except as noted above.  Labs:    Radiology/Studies:  No results found.    Physical Exam: Blood pressure 163/74, pulse 72, temperature 98.7 F (37.1 C), temperature source Oral, resp. rate 18, height 5\' 11"  (1.803 m), weight 113.3 kg (249 lb 12.5 oz), SpO2 98.00%. General: Well developed, well nourished, in no acute distress. Head: Normocephalic, atraumatic, sclera non-icteric, nares are without discharge Neck: Supple. Negative for carotid bruits. JVD not elevated. Lungs: crackles at bases Heart: RRR with S1 S2. No murmurs, rubs, or gallops appreciated. Abdomen: Soft, non-tender, non-distended with normoactive bowel sounds. No rebound/guarding. No obvious abdominal masses. Msk:  Strength and tone appear normal for age. Extremities: No edema. No clubbing or cyanosis. Distal pedal pulses are 2+ and equal bilaterally. Neuro: Alert and oriented X 3. Moves all extremities spontaneously. Psych:  Responds to questions appropriately with a normal affect.    ASSESSMENT AND PLAN:  Problem List 1. +Troponin, likely NSTEMI but sxs atypical 2. Known CAD s/p CABG, recent MI 10/2013 3.  Obesity 4. Subjective fever, elevated WBC 5. +D-Dimer, pleuritic chest pain. 6. Severe contrast allergy 7. Tobacco abuse, ongoing  48 y.o. male w/ PMHx significant for CAD s/p CABG, ischemic CMP with EF of 45% who presented to [facility] on 04/28/2014 with complaints of chest pain, rather atypical with significant pleuritic component but with elevated troponin.  Difficult situation given his atypical symptom complex, risk factors and his severe allergy to contrast. Due to the pleuritic component and elevated D-dimer, a pulmonary embolism is possible. Will check LE dopplers but will discuss with team whether to get CT angiography (will evaluate for PE, PNA as well as dissection hopefully) versus cardiac cath.  Continue aspirin, plavix (reload), statin, imdur, CCB and BB. Given lovenox full dose at OSH. Hold incase cathterization is desired.  Continue antihypertensives of BB, clonidine, ARB, HCTZ., amlodipine.  Due to severe contrast allergy, will begin pre-treating now with solumedrol IV and benadryl. Apparently Isoview contrast is best according to notes.  Finally, also appears by cxray and BNP to be slightly fluid long. Will diurese gently with low dose IV lasix. Know reduced EF. On carvedilol, ARB. Continue HCTZ.  Finally, with fever (at home), elevated WBC, cough, and cxray, this also could be pneumonia. Will hold on antibiotics until it is more clearly declared.  Smoking cessation strongly encouraged. Not at pre-contemplative phase yet.  Full code. NPO. Lovenox given. Weston Brass, Elias Else, Mila Homer MD 04/28/2014, 5:44 AM

## 2014-05-04 ENCOUNTER — Encounter: Payer: Self-pay | Admitting: Cardiology

## 2014-05-04 ENCOUNTER — Ambulatory Visit (INDEPENDENT_AMBULATORY_CARE_PROVIDER_SITE_OTHER): Payer: Medicare Other | Admitting: Cardiology

## 2014-05-04 VITALS — BP 174/88 | HR 70 | Ht 71.0 in | Wt 261.0 lb

## 2014-05-04 DIAGNOSIS — I2510 Atherosclerotic heart disease of native coronary artery without angina pectoris: Secondary | ICD-10-CM | POA: Diagnosis not present

## 2014-05-04 DIAGNOSIS — I429 Cardiomyopathy, unspecified: Secondary | ICD-10-CM | POA: Diagnosis not present

## 2014-05-04 DIAGNOSIS — R944 Abnormal results of kidney function studies: Secondary | ICD-10-CM | POA: Diagnosis not present

## 2014-05-04 DIAGNOSIS — F17200 Nicotine dependence, unspecified, uncomplicated: Secondary | ICD-10-CM

## 2014-05-04 DIAGNOSIS — Z720 Tobacco use: Secondary | ICD-10-CM | POA: Diagnosis not present

## 2014-05-04 MED ORDER — SPIRONOLACTONE 25 MG PO TABS: 25.0000 mg | ORAL_TABLET | Freq: Every day | ORAL | Status: DC

## 2014-05-04 MED ORDER — LOSARTAN POTASSIUM 100 MG PO TABS: 100.0000 mg | ORAL_TABLET | Freq: Every day | ORAL | Status: DC

## 2014-05-04 NOTE — Progress Notes (Signed)
Clinical Summary [name] is a medically complex 48 y.o.male last seen in July. Recent hospital admission at [facility] noted, evaluation by Dr. Marlou Porch. Patient presented with atypical, pleuritic chest discomfort associated with elevated troponin I levels up to 8.4. Also d-dimer 0.9. VQ scan was low probability for pulmonary embolus. Patient did not undergo any additional cardiac structural or ischemic evaluation during his hospital stay, was discharged home on medical therapy.  He presents today for followup, denies any thoracic discomfort. He is careful to tell me that he did not have precordial pain, it was an upper back discomfort that was pleuritic in nature. Did not feel like prior angina. It is hard to explain his troponin I elevations without implicating a cardiac event, possibly he had a small vessel infarct, myocarditis to be considered. He also tells me that he seemed to get better with Lasix.  Most recent cardiac catheterization done back in April of this year in the setting of medication noncompliance and NSTEMI demonstrated severe multivessel disease with patency of the LIMA to LAD, SVG to first diagonal, SVG to OM1 and OM 2, and SVG to PDA. Contrast echocardiogram done in July showed LVEF in the range of 45-50% with mild LV chamber dilatation and grade 2 diastolic dysfunction. Wall motion abnormalities were noted consistent with ischemic heart disease.  Today we reviewed his medications and discussed potential modification of therapy. I also stressed the importance of smoking cessation and asked him to continue to work on this.   Allergies  Allergen Reactions  . Contrast Media [Iodinated Diagnostic Agents] Anaphylaxis, Shortness Of Breath, Swelling and Rash    Isovue contrast is most acceptable agent based on previous experience and testing with premedications  . Ibuprofen Anaphylaxis, Hives and Swelling  . Nsaids Anaphylaxis, Hives, Swelling and Rash    Current Outpatient  Prescriptions  Medication Sig Dispense Refill  . acetaminophen (TYLENOL) 500 MG tablet Take 1,000 mg by mouth every 6 (six) hours as needed for mild pain, moderate pain or headache.       . albuterol (PROVENTIL HFA;VENTOLIN HFA) 108 (90 BASE) MCG/ACT inhaler Inhale 2 puffs into the lungs every 6 (six) hours as needed for wheezing or shortness of breath.      . ALPRAZolam (XANAX) 1 MG tablet Take 1 mg by mouth 4 (four) times daily as needed for anxiety.       [name] amLODipine (NORVASC) 10 MG tablet Take 10 mg by mouth daily.      [name] aspirin EC 325 MG tablet Take 325 mg by mouth daily.      . carvedilol (COREG) 25 MG tablet Take 1 tablet (25 mg total) by mouth 2 (two) times daily with a meal.  60 tablet  6  . cloNIDine (CATAPRES) 0.1 MG tablet Take 1 tablet (0.1 mg total) by mouth daily as needed (*Only taken for blood pressure levels that are >170/100*).  60 tablet  1  . clopidogrel (PLAVIX) 75 MG tablet Take 1 tablet (75 mg total) by mouth daily with breakfast.  30 tablet  11  . DULoxetine (CYMBALTA) 60 MG capsule Take 60 mg by mouth daily.      . isosorbide mononitrate (IMDUR) 60 MG 24 hr tablet Take 60 mg by mouth 2 (two) times daily.       . nitroGLYCERIN (NITROSTAT) 0.4 MG SL tablet Place 0.4 mg under the tongue every 5 (five) minutes as needed. For chest pain      . rosuvastatin (CRESTOR) 40 MG  tablet Take 40 mg by mouth daily.      . traMADol (ULTRAM) 50 MG tablet Take 50 mg by mouth every 6 (six) hours as needed. For pain      . UNKNOWN TO PATIENT Apply 1 application topically daily as needed (for psoriasis).      [name] losartan (COZAAR) 100 MG tablet Take 1 tablet (100 mg total) by mouth daily.  90 tablet  3  . spironolactone (ALDACTONE) 25 MG tablet Take 1 tablet (25 mg total) by mouth daily.  90 tablet  3   No current facility-administered medications for this visit.    Past Medical History  Diagnosis Date  . Mixed hyperlipidemia   . Essential hypertension, benign   . COPD (chronic  obstructive pulmonary disease)   . Depression   . Anaphylaxis     IGE mediated  . Myocardial infarction 2010  . Anxiety   . Arthritis   . Type 2 diabetes mellitus   . OSA (obstructive sleep apnea)   . History of CVA (cerebrovascular accident) 01/2012    Right posterior frontal cortical and subcortical brain by MRI, no hemorrhage. Carotid Dopplers showed only 1-50% bilateral ICA stenoses. Echocardiogram showed LVEF 50%, no major valvular abnormalities.  . Coronary atherosclerosis of native coronary artery     Multivessel status post CABG 2010  . Cardiomyopathy     Past Surgical History  Procedure Laterality Date  . Gastric bypass  2010  . Hernia repair  2011, 2012  . Toe amputation  1998    right 1st and 2nd toe  . Tonsilectomy, adenoidectomy, bilateral myringotomy and tubes    . Dental surgery  2003  . Cholecystectomy    . Coronary artery bypass graft  2010    LIMA to LAD, SVG to diagonal, SVG to OM1 and OM 2, SVG to RCA  . Tonsillectomy    . Ventral hernia repair N/A 10/28/2012    Procedure: LAPAROSCOPIC VENTRAL HERNIA;  Surgeon: Donato Heinz, MD;  Location: [facility];  Service: General;  Laterality: N/A;  . Incisional hernia repair N/A 07/23/2013    Procedure: HERNIA REPAIR INCISIONAL WITH MESH;  Surgeon: Jamesetta So, MD;  Location: [facility];  Service: General;  Laterality: N/A;  . Insertion of mesh N/A 07/23/2013    Procedure: INSERTION OF MESH;  Surgeon: Jamesetta So, MD;  Location: [facility];  Service: General;  Laterality: N/A;    Family History  Problem Relation Age of Onset  . Breast cancer Maternal Aunt   . Lung cancer Father     Social History Mr. Jeffres reports that he has been smoking Cigarettes.  He has a 30 pack-year smoking history. He has never used smokeless tobacco. Mr. Ferraiolo reports that he drinks alcohol.  Review of Systems No palpitations, no cough or hemoptysis, no fevers or chills. Other systems reviewed and negative.  Physical Examination Filed  Vitals:   05/04/14 1051  BP: 174/88  Pulse: 70   Filed Weights   05/04/14 1051  Weight: 261 lb (118.389 kg)    Overweight male, appears comfortable at rest.  HEENT: Conjunctiva and lids normal, oropharynx clear.  Neck: Supple, no elevated JVP or carotid bruits, no thyromegaly.  Lungs: Clear to auscultation, nonlabored breathing at rest.  Cardiac: Regular rate and rhythm, no S3, soft systolic murmur, no pericardial rub.  Abdomen: Soft, nontender, bowel sounds present, no guarding or rebound.  Extremities: No pitting edema, distal pulses 2+.  Skin: Warm and dry.  Musculoskeletal: No kyphosis.  Neuropsychiatric: Alert and oriented x3, affect grossly appropriate.   Problem List and Plan   Coronary atherosclerosis of native coronary artery Plan at this time is to continue medical therapy and observation. Current regimen reviewed.. Stop Hyzaar and convert to Cozaar 100 mg daily and Aldactone 25 mg daily. May be beneficial in terms of regulating volume status and reducing blood pressure. Followup BMET in 2 weeks, office visit in the next few months.  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  Tobacco abuse I continue to stress complete smoking cessation.    Satira Sark, M.D., F.A.C.C.

## 2014-05-04 NOTE — Assessment & Plan Note (Signed)
LVEF 45-50% based on followup echocardiogram in July.

## 2014-05-04 NOTE — Patient Instructions (Signed)
Your physician recommends that you schedule a follow-up appointment in: 2 months with Dr. Domenic Polite in [place] has recommended you make the following change in your medication:   STOP LOSARTAN-HCTZ  START COZAAR 100 MG DAILY  START ALDACTONE 25 MG DAILY  Your physician recommends that you return for lab work in: 2 WEEK BMP  Thank you for choosing [facility]!!

## 2014-05-04 NOTE — Assessment & Plan Note (Signed)
I continue to stress complete smoking cessation.

## 2014-05-04 NOTE — Assessment & Plan Note (Signed)
Plan at this time is to continue medical therapy and observation. Current regimen reviewed.. Stop Hyzaar and convert to Cozaar 100 mg daily and Aldactone 25 mg daily. May be beneficial in terms of regulating volume status and reducing blood pressure. Followup [facility] in 2 weeks, office visit in the next few months.

## 2014-05-10 ENCOUNTER — Other Ambulatory Visit: Payer: Self-pay | Admitting: *Deleted

## 2014-05-10 MED ORDER — CLONIDINE HCL 0.1 MG PO TABS: 0.1000 mg | ORAL_TABLET | Freq: Every day | ORAL | Status: DC | PRN

## 2014-05-11 DIAGNOSIS — I2510 Atherosclerotic heart disease of native coronary artery without angina pectoris: Secondary | ICD-10-CM | POA: Diagnosis not present

## 2014-05-11 DIAGNOSIS — M546 Pain in thoracic spine: Secondary | ICD-10-CM | POA: Diagnosis not present

## 2014-05-11 DIAGNOSIS — Y939 Activity, unspecified: Secondary | ICD-10-CM | POA: Diagnosis not present

## 2014-05-11 DIAGNOSIS — F17200 Nicotine dependence, unspecified, uncomplicated: Secondary | ICD-10-CM | POA: Diagnosis not present

## 2014-05-11 DIAGNOSIS — E119 Type 2 diabetes mellitus without complications: Secondary | ICD-10-CM | POA: Diagnosis not present

## 2014-05-11 DIAGNOSIS — Z7982 Long term (current) use of aspirin: Secondary | ICD-10-CM | POA: Diagnosis not present

## 2014-05-11 DIAGNOSIS — M549 Dorsalgia, unspecified: Secondary | ICD-10-CM | POA: Diagnosis not present

## 2014-05-11 DIAGNOSIS — S29019A Strain of muscle and tendon of unspecified wall of thorax, initial encounter: Secondary | ICD-10-CM | POA: Diagnosis not present

## 2014-05-11 DIAGNOSIS — Z7902 Long term (current) use of antithrombotics/antiplatelets: Secondary | ICD-10-CM | POA: Diagnosis not present

## 2014-05-11 DIAGNOSIS — I10 Essential (primary) hypertension: Secondary | ICD-10-CM | POA: Diagnosis not present

## 2014-05-11 DIAGNOSIS — Z79899 Other long term (current) drug therapy: Secondary | ICD-10-CM | POA: Diagnosis not present

## 2014-05-16 DIAGNOSIS — E119 Type 2 diabetes mellitus without complications: Secondary | ICD-10-CM | POA: Diagnosis not present

## 2014-05-16 DIAGNOSIS — H524 Presbyopia: Secondary | ICD-10-CM | POA: Diagnosis not present

## 2014-05-16 DIAGNOSIS — H52223 Regular astigmatism, bilateral: Secondary | ICD-10-CM | POA: Diagnosis not present

## 2014-05-18 DIAGNOSIS — R944 Abnormal results of kidney function studies: Secondary | ICD-10-CM | POA: Diagnosis not present

## 2014-05-19 ENCOUNTER — Telehealth: Payer: Self-pay | Admitting: *Deleted

## 2014-05-19 NOTE — Telephone Encounter (Signed)
Patient informed. 

## 2014-05-19 NOTE — Telephone Encounter (Signed)
-----   Message from Satira Sark, MD sent at 05/19/2014 11:03 AM EST ----- Reviewed. Creatinine is 1.3 at this time and potassium normal at 4.3. Would continue current medications which were recently adjusted.

## 2014-06-23 ENCOUNTER — Encounter (HOSPITAL_COMMUNITY): Payer: Self-pay | Admitting: Cardiovascular Disease

## 2014-06-29 DIAGNOSIS — E782 Mixed hyperlipidemia: Secondary | ICD-10-CM | POA: Diagnosis not present

## 2014-06-29 DIAGNOSIS — L723 Sebaceous cyst: Secondary | ICD-10-CM | POA: Diagnosis not present

## 2014-06-29 DIAGNOSIS — E8881 Metabolic syndrome: Secondary | ICD-10-CM | POA: Diagnosis not present

## 2014-06-29 DIAGNOSIS — I10 Essential (primary) hypertension: Secondary | ICD-10-CM | POA: Diagnosis not present

## 2014-07-04 ENCOUNTER — Encounter: Payer: Self-pay | Admitting: Cardiology

## 2014-07-04 ENCOUNTER — Encounter: Payer: Self-pay | Admitting: *Deleted

## 2014-07-04 ENCOUNTER — Encounter: Payer: Medicare Other | Admitting: Cardiology

## 2014-07-04 NOTE — Progress Notes (Signed)
No show  This encounter was created in error - please disregard.

## 2014-07-16 DIAGNOSIS — R109 Unspecified abdominal pain: Secondary | ICD-10-CM | POA: Diagnosis not present

## 2014-07-16 DIAGNOSIS — M1990 Unspecified osteoarthritis, unspecified site: Secondary | ICD-10-CM | POA: Diagnosis not present

## 2014-07-16 DIAGNOSIS — L905 Scar conditions and fibrosis of skin: Secondary | ICD-10-CM | POA: Diagnosis not present

## 2014-07-16 DIAGNOSIS — E119 Type 2 diabetes mellitus without complications: Secondary | ICD-10-CM | POA: Diagnosis not present

## 2014-07-16 DIAGNOSIS — F329 Major depressive disorder, single episode, unspecified: Secondary | ICD-10-CM | POA: Diagnosis not present

## 2014-07-16 DIAGNOSIS — J9811 Atelectasis: Secondary | ICD-10-CM | POA: Diagnosis not present

## 2014-07-16 DIAGNOSIS — Z720 Tobacco use: Secondary | ICD-10-CM | POA: Diagnosis not present

## 2014-07-16 DIAGNOSIS — K429 Umbilical hernia without obstruction or gangrene: Secondary | ICD-10-CM | POA: Diagnosis not present

## 2014-07-16 DIAGNOSIS — I2510 Atherosclerotic heart disease of native coronary artery without angina pectoris: Secondary | ICD-10-CM | POA: Diagnosis not present

## 2014-07-16 DIAGNOSIS — Z79899 Other long term (current) drug therapy: Secondary | ICD-10-CM | POA: Diagnosis not present

## 2014-07-19 ENCOUNTER — Ambulatory Visit (INDEPENDENT_AMBULATORY_CARE_PROVIDER_SITE_OTHER): Payer: Medicare Other | Admitting: Cardiology

## 2014-07-19 ENCOUNTER — Encounter: Payer: Self-pay | Admitting: Cardiology

## 2014-07-19 VITALS — BP 168/81 | HR 84 | Ht 71.0 in | Wt 267.0 lb

## 2014-07-19 DIAGNOSIS — I429 Cardiomyopathy, unspecified: Secondary | ICD-10-CM

## 2014-07-19 DIAGNOSIS — I10 Essential (primary) hypertension: Secondary | ICD-10-CM

## 2014-07-19 DIAGNOSIS — I2510 Atherosclerotic heart disease of native coronary artery without angina pectoris: Secondary | ICD-10-CM | POA: Diagnosis not present

## 2014-07-19 NOTE — Progress Notes (Signed)
Reason for visit: CAD, cardiomyopathy  Clinical Summary Mr. Cerasoli is a medically complex 49 y.o.male last seen in October 2015. He missed his last follow-up visit with me. He denies any angina symptoms on present medical therapy. Currently, he is trying to quit smoking and has support from his family as well as a smoking cessation coach. He is now using a nicotine patch. He reports compliance with his medications. Tries to do some walking for exercise. We talked about his diet, and also trying to increase his activity.  Labwork from November showed BUN 18, creatinine 1.3, potassium 4.3.  Most recent cardiac catheterization done back in April 2015 in the setting of medication noncompliance and NSTEMI demonstrated severe multivessel disease with patency of the LIMA to LAD, SVG to first diagonal, SVG to OM1 and OM 2, and SVG to PDA. Contrast echocardiogram done in July showed LVEF in the range of 45-50% with mild LV chamber dilatation and grade 2 diastolic dysfunction. Wall motion abnormalities were noted consistent with ischemic heart disease.   Allergies  Allergen Reactions  . Contrast Media [Iodinated Diagnostic Agents] Anaphylaxis, Shortness Of Breath, Swelling and Rash    Isovue contrast is most acceptable agent based on previous experience and testing with premedications  . Ibuprofen Anaphylaxis, Hives and Swelling  . Nsaids Anaphylaxis, Hives, Swelling and Rash    Current Outpatient Prescriptions  Medication Sig Dispense Refill  . acetaminophen (TYLENOL) 500 MG tablet Take 1,000 mg by mouth every 6 (six) hours as needed for mild pain, moderate pain or headache.     . albuterol (PROVENTIL HFA;VENTOLIN HFA) 108 (90 BASE) MCG/ACT inhaler Inhale 2 puffs into the lungs every 6 (six) hours as needed for wheezing or shortness of breath.    . ALPRAZolam (XANAX) 1 MG tablet Take 1 mg by mouth 4 (four) times daily as needed for anxiety.     [name] amLODipine (NORVASC) 10 MG tablet Take 10 mg by  mouth daily.    [name] aspirin EC 325 MG tablet Take 325 mg by mouth daily.    . carvedilol (COREG) 25 MG tablet Take 1 tablet (25 mg total) by mouth 2 (two) times daily with a meal. 60 tablet 6  . cloNIDine (CATAPRES) 0.1 MG tablet Take 1 tablet (0.1 mg total) by mouth daily as needed (*Only taken for blood pressure levels that are >170/100*). 60 tablet 2  . clopidogrel (PLAVIX) 75 MG tablet Take 1 tablet (75 mg total) by mouth daily with breakfast. 30 tablet 11  . DULoxetine (CYMBALTA) 60 MG capsule Take 60 mg by mouth daily.    . isosorbide mononitrate (IMDUR) 60 MG 24 hr tablet Take 60 mg by mouth 2 (two) times daily.     [name] losartan (COZAAR) 100 MG tablet Take 1 tablet (100 mg total) by mouth daily. 90 tablet 3  . nicotine (NICODERM CQ - DOSED IN MG/24 HOURS) 21 mg/24hr patch Place 21 mg onto the skin daily.    . nitroGLYCERIN (NITROSTAT) 0.4 MG SL tablet Place 0.4 mg under the tongue every 5 (five) minutes as needed. For chest pain    . rosuvastatin (CRESTOR) 40 MG tablet Take 40 mg by mouth daily.    [name] spironolactone (ALDACTONE) 25 MG tablet Take 1 tablet (25 mg total) by mouth daily. 90 tablet 3  . traMADol (ULTRAM) 50 MG tablet Take 50 mg by mouth every 6 (six) hours as needed. For pain    . UNKNOWN TO PATIENT Apply 1 application topically daily as needed (  for psoriasis).     No current facility-administered medications for this visit.    Past Medical History  Diagnosis Date  . Mixed hyperlipidemia   . Essential hypertension, benign   . COPD (chronic obstructive pulmonary disease)   . Depression   . Anaphylaxis     IGE mediated  . Myocardial infarction 2010  . Anxiety   . Arthritis   . Type 2 diabetes mellitus   . OSA (obstructive sleep apnea)   . History of CVA (cerebrovascular accident) 01/2012    Right posterior frontal cortical and subcortical brain by MRI, no hemorrhage. Carotid Dopplers showed only 1-50% bilateral ICA stenoses. Echocardiogram showed LVEF 50%, no major valvular  abnormalities.  . Coronary atherosclerosis of native coronary artery     Multivessel status post CABG 2010  . Cardiomyopathy     Past Surgical History  Procedure Laterality Date  . Gastric bypass  2010  . Hernia repair  2011, 2012  . Toe amputation  1998    right 1st and 2nd toe  . Tonsilectomy, adenoidectomy, bilateral myringotomy and tubes    . Dental surgery  2003  . Cholecystectomy    . Coronary artery bypass graft  2010    LIMA to LAD, SVG to diagonal, SVG to OM1 and OM 2, SVG to RCA  . Tonsillectomy    . Ventral hernia repair N/A 10/28/2012    Procedure: LAPAROSCOPIC VENTRAL HERNIA;  Surgeon: Donato Heinz, MD;  Location: [facility];  Service: General;  Laterality: N/A;  . Incisional hernia repair N/A 07/23/2013    Procedure: HERNIA REPAIR INCISIONAL WITH MESH;  Surgeon: Jamesetta So, MD;  Location: [facility];  Service: General;  Laterality: N/A;  . Insertion of mesh N/A 07/23/2013    Procedure: INSERTION OF MESH;  Surgeon: Jamesetta So, MD;  Location: [facility];  Service: General;  Laterality: N/A;  . Left heart catheterization with coronary/graft angiogram N/A 11/01/2013    Procedure: LEFT HEART CATHETERIZATION WITH Beatrix Fetters;  Surgeon: Blane Ohara, MD;  Location: [facility] CATH LAB;  Service: Cardiovascular;  Laterality: N/A;    Social History Mr. Braam reports that he has been smoking Cigarettes.  He started smoking about 32 years ago. He has a 22.5 pack-year smoking history. He has never used smokeless tobacco. Mr. Yenter reports that he drinks alcohol.  Review of Systems Complete review of systems negative except as otherwise outlined in the clinical summary and also the following. No palpitations or syncope. He has been having some right lower back pain recently. No fevers or chills. No hematuria. Plan to see Dr. Quillian Quince.  Physical Examination Filed Vitals:   07/19/14 1022  BP: 168/81  Pulse: 84   Filed Weights   07/19/14 1022  Weight: 267 lb (121.11 kg)     Overweight male, appears comfortable at rest.  HEENT: Conjunctiva and lids normal, oropharynx clear.  Neck: Supple, no elevated JVP or carotid bruits, no thyromegaly.  Lungs: Clear to auscultation, nonlabored breathing at rest.  Cardiac: Regular rate and rhythm, no S3, soft systolic murmur, no pericardial rub.  Abdomen: Soft, nontender, bowel sounds present, no guarding or rebound.  Extremities: No pitting edema, distal pulses 2+.  Skin: Warm and dry.  Musculoskeletal: No kyphosis.  Neuropsychiatric: Alert and oriented x3, affect grossly appropriate.   Problem List and Plan   Coronary atherosclerosis of native coronary artery Plan is to continue medical therapy and observation for now. Keep working on smoking cessation, increase activity as tolerated. No changes  made to current regimen. Follow-up in 3 months.  Essential hypertension, benign Blood pressure is up today. We made adjustments in his medications at the last visit. Continue to work on diet and exercise.  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    Satira Sark, M.D., F.A.C.C.

## 2014-07-19 NOTE — Assessment & Plan Note (Signed)
Blood pressure is up today. We made adjustments in his medications at the last visit. Continue to work on diet and exercise.

## 2014-07-19 NOTE — Patient Instructions (Signed)
Your physician recommends that you schedule a follow-up appointment in: 3 months with Dr. McDowell  Your physician recommends that you continue on your current medications as directed. Please refer to the Current Medication list given to you today.  Thank you for choosing [facility]!!    

## 2014-07-19 NOTE — Assessment & Plan Note (Signed)
LVEF 45-50% by most recent assessment.

## 2014-07-19 NOTE — Assessment & Plan Note (Signed)
Plan is to continue medical therapy and observation for now. Keep working on smoking cessation, increase activity as tolerated. No changes made to current regimen. Follow-up in 3 months.

## 2014-07-21 DIAGNOSIS — F17210 Nicotine dependence, cigarettes, uncomplicated: Secondary | ICD-10-CM | POA: Diagnosis not present

## 2014-07-21 DIAGNOSIS — I10 Essential (primary) hypertension: Secondary | ICD-10-CM | POA: Diagnosis not present

## 2014-07-21 DIAGNOSIS — I2510 Atherosclerotic heart disease of native coronary artery without angina pectoris: Secondary | ICD-10-CM | POA: Diagnosis not present

## 2014-07-21 DIAGNOSIS — N200 Calculus of kidney: Secondary | ICD-10-CM | POA: Diagnosis not present

## 2014-07-21 DIAGNOSIS — E119 Type 2 diabetes mellitus without complications: Secondary | ICD-10-CM | POA: Diagnosis not present

## 2014-07-21 DIAGNOSIS — E782 Mixed hyperlipidemia: Secondary | ICD-10-CM | POA: Diagnosis not present

## 2014-07-21 DIAGNOSIS — H01006 Unspecified blepharitis left eye, unspecified eyelid: Secondary | ICD-10-CM | POA: Diagnosis not present

## 2014-07-21 DIAGNOSIS — M1991 Primary osteoarthritis, unspecified site: Secondary | ICD-10-CM | POA: Diagnosis not present

## 2014-07-21 DIAGNOSIS — M545 Low back pain: Secondary | ICD-10-CM | POA: Diagnosis not present

## 2014-07-21 DIAGNOSIS — E8881 Metabolic syndrome: Secondary | ICD-10-CM | POA: Diagnosis not present

## 2014-07-21 DIAGNOSIS — L719 Rosacea, unspecified: Secondary | ICD-10-CM | POA: Diagnosis not present

## 2014-07-25 ENCOUNTER — Emergency Department (HOSPITAL_COMMUNITY)
Admission: EM | Admit: 2014-07-25 | Discharge: 2014-07-25 | Disposition: A | Discharge status: Home / Self Care | Payer: Medicare Other | Attending: Emergency Medicine | Admitting: Emergency Medicine

## 2014-07-25 ENCOUNTER — Encounter (HOSPITAL_COMMUNITY): Payer: Self-pay | Admitting: *Deleted

## 2014-07-25 DIAGNOSIS — E782 Mixed hyperlipidemia: Secondary | ICD-10-CM | POA: Insufficient documentation

## 2014-07-25 DIAGNOSIS — Z7902 Long term (current) use of antithrombotics/antiplatelets: Secondary | ICD-10-CM | POA: Insufficient documentation

## 2014-07-25 DIAGNOSIS — I10 Essential (primary) hypertension: Secondary | ICD-10-CM | POA: Diagnosis not present

## 2014-07-25 DIAGNOSIS — J449 Chronic obstructive pulmonary disease, unspecified: Secondary | ICD-10-CM | POA: Diagnosis not present

## 2014-07-25 DIAGNOSIS — F329 Major depressive disorder, single episode, unspecified: Secondary | ICD-10-CM | POA: Insufficient documentation

## 2014-07-25 DIAGNOSIS — Z79899 Other long term (current) drug therapy: Secondary | ICD-10-CM | POA: Insufficient documentation

## 2014-07-25 DIAGNOSIS — M5441 Lumbago with sciatica, right side: Secondary | ICD-10-CM | POA: Diagnosis not present

## 2014-07-25 DIAGNOSIS — M1990 Unspecified osteoarthritis, unspecified site: Secondary | ICD-10-CM | POA: Diagnosis not present

## 2014-07-25 DIAGNOSIS — F419 Anxiety disorder, unspecified: Secondary | ICD-10-CM | POA: Insufficient documentation

## 2014-07-25 DIAGNOSIS — I2510 Atherosclerotic heart disease of native coronary artery without angina pectoris: Secondary | ICD-10-CM | POA: Insufficient documentation

## 2014-07-25 DIAGNOSIS — Z7982 Long term (current) use of aspirin: Secondary | ICD-10-CM | POA: Insufficient documentation

## 2014-07-25 DIAGNOSIS — Z8673 Personal history of transient ischemic attack (TIA), and cerebral infarction without residual deficits: Secondary | ICD-10-CM | POA: Diagnosis not present

## 2014-07-25 DIAGNOSIS — Z720 Tobacco use: Secondary | ICD-10-CM | POA: Insufficient documentation

## 2014-07-25 DIAGNOSIS — I252 Old myocardial infarction: Secondary | ICD-10-CM | POA: Diagnosis not present

## 2014-07-25 DIAGNOSIS — M549 Dorsalgia, unspecified: Secondary | ICD-10-CM | POA: Diagnosis present

## 2014-07-25 MED ORDER — OXYCODONE-ACETAMINOPHEN 5-325 MG PO TABS: 1.0000 | ORAL_TABLET | Freq: Four times a day (QID) | ORAL | Status: DC | PRN

## 2014-07-25 MED ORDER — OXYCODONE-ACETAMINOPHEN 5-325 MG PO TABS
2.0000 | ORAL_TABLET | Freq: Once | ORAL | Status: AC
  Administered 2014-07-25: 2 via ORAL
  Filled 2014-07-25: qty 2

## 2014-07-25 MED ORDER — DOCUSATE SODIUM 100 MG PO CAPS: 100.0000 mg | ORAL_CAPSULE | Freq: Two times a day (BID) | ORAL | Status: DC

## 2014-07-25 NOTE — ED Provider Notes (Signed)
TIME SEEN: 3:50 PM  CHIEF COMPLAINT: Back pain  HPI: Pt is a 49 y.o. male with history of COPD, obstructive sleep apnea, prior CVA, coronary artery disease who presents to the emergency department with complaints of back pain since 07/12/14. No history of injury. Worse with sitting for prolonged periods of time and bending over. No numbness, tingling or focal weakness. No bowel or bladder incontinence. No fever. States he was [facility] on 07/16/14 and had a CT scan of his abdomen and pelvis to evaluate for kidney stone which was negative. Reports he was discharged home with tramadol which he takes chronically for neuropathy and this has not been controlling his pain.  ROS: See HPI Constitutional: no fever  Eyes: no drainage  ENT: no runny nose   Cardiovascular:  no chest pain  Resp: no SOB  GI: no vomiting GU: no dysuria Integumentary: no rash  Allergy: no hives  Musculoskeletal: no leg swelling  Neurological: no slurred speech ROS otherwise negative  PAST MEDICAL HISTORY/PAST SURGICAL HISTORY:  Past Medical History  Diagnosis Date  . Mixed hyperlipidemia   . Essential hypertension, benign   . COPD (chronic obstructive pulmonary disease)   . Depression   . Anaphylaxis     IGE mediated  . Myocardial infarction 2010  . Anxiety   . Arthritis   . Type 2 diabetes mellitus   . OSA (obstructive sleep apnea)   . History of CVA (cerebrovascular accident) 01/2012    Right posterior frontal cortical and subcortical brain by MRI, no hemorrhage. Carotid Dopplers showed only 1-50% bilateral ICA stenoses. Echocardiogram showed LVEF 50%, no major valvular abnormalities.  . Coronary atherosclerosis of native coronary artery     Multivessel status post CABG 2010  . Cardiomyopathy     MEDICATIONS:  Prior to Admission medications   Medication Sig Start Date End Date Taking? Authorizing Provider  acetaminophen (TYLENOL) 500 MG tablet Take 1,000 mg by mouth every 6 (six) hours as  needed for mild pain, moderate pain or headache.     Historical Provider, MD  albuterol (PROVENTIL HFA;VENTOLIN HFA) 108 (90 BASE) MCG/ACT inhaler Inhale 2 puffs into the lungs every 6 (six) hours as needed for wheezing or shortness of breath.    Historical Provider, MD  ALPRAZolam Duanne Moron) 1 MG tablet Take 1 mg by mouth 4 (four) times daily as needed for anxiety.     Historical Provider, MD  amLODipine (NORVASC) 10 MG tablet Take 10 mg by mouth daily.    Historical Provider, MD  aspirin EC 325 MG tablet Take 325 mg by mouth daily.    Historical Provider, MD  carvedilol (COREG) 25 MG tablet Take 1 tablet (25 mg total) by mouth 2 (two) times daily with a meal. 11/24/13   Lendon Colonel, NP  cloNIDine (CATAPRES) 0.1 MG tablet Take 1 tablet (0.1 mg total) by mouth daily as needed (*Only taken for blood pressure levels that are >170/100*). 05/10/14   Satira Sark, MD  clopidogrel (PLAVIX) 75 MG tablet Take 1 tablet (75 mg total) by mouth daily with breakfast. 11/02/13   Brett Canales, PA-C  DULoxetine (CYMBALTA) 60 MG capsule Take 60 mg by mouth daily.    Historical Provider, MD  isosorbide mononitrate (IMDUR) 60 MG 24 hr tablet Take 60 mg by mouth 2 (two) times daily.     Historical Provider, MD  losartan (COZAAR) 100 MG tablet Take 1 tablet (100 mg total) by mouth daily. 05/04/14   Satira Sark, MD  nicotine (NICODERM CQ - DOSED IN MG/24 HOURS) 21 mg/24hr patch Place 21 mg onto the skin daily.    Historical Provider, MD  nitroGLYCERIN (NITROSTAT) 0.4 MG SL tablet Place 0.4 mg under the tongue every 5 (five) minutes as needed. For chest pain    Historical Provider, MD  rosuvastatin (CRESTOR) 40 MG tablet Take 40 mg by mouth daily.    Historical Provider, MD  spironolactone (ALDACTONE) 25 MG tablet Take 1 tablet (25 mg total) by mouth daily. 05/04/14   Satira Sark, MD  traMADol (ULTRAM) 50 MG tablet Take 50 mg by mouth every 6 (six) hours as needed. For pain    Historical Provider, MD   UNKNOWN TO PATIENT Apply 1 application topically daily as needed (for psoriasis).    Historical Provider, MD    ALLERGIES:  Allergies  Allergen Reactions  . Contrast Media [Iodinated Diagnostic Agents] Anaphylaxis, Shortness Of Breath, Swelling and Rash    Isovue contrast is most acceptable agent based on previous experience and testing with premedications  . Ibuprofen Anaphylaxis, Hives and Swelling  . Nsaids Anaphylaxis, Hives, Swelling and Rash    SOCIAL HISTORY:  History  Substance Use Topics  . Smoking status: Current Every Day Smoker -- 0.75 packs/day for 30 years    Types: Cigarettes    Start date: 06/08/1982  . Smokeless tobacco: Never Used     Comment: trying to cut back now down to 14 cigarettes per day. Currently on Nicoderm Patch when not smoking   . Alcohol Use: Yes     Comment: rarely    FAMILY HISTORY: Family History  Problem Relation Age of Onset  . Breast cancer Maternal Aunt   . Lung cancer Father     EXAM: BP 164/76 mmHg  Pulse 85  Temp(Src) 99 F (37.2 C) (Oral)  Resp 18  Ht 5\' 11"  (1.803 m)  Wt 267 lb (121.11 kg)  BMI 37.26 kg/m2  SpO2 99% CONSTITUTIONAL: Alert and oriented and responds appropriately to questions. Well-appearing; well-nourished HEAD: Normocephalic EYES: Conjunctivae clear, PERRL ENT: normal nose; no rhinorrhea; moist mucous membranes; pharynx without lesions noted NECK: Supple, no meningismus, no LAD  CARD: RRR; S1 and S2 appreciated; no murmurs, no clicks, no rubs, no gallops RESP: Normal chest excursion without splinting or tachypnea; breath sounds clear and equal bilaterally; no wheezes, no rhonchi, no rales,  ABD/GI: Normal bowel sounds; non-distended; soft, non-tender, no rebound, no guarding BACK:  The back appears normal and is tender to palpation in the lumbar spine without step-off or deformity, no CVA tenderness EXT: Normal ROM in all joints; non-tender to palpation; no edema; normal capillary refill; no cyanosis     SKIN: Normal color for age and race; warm NEURO: Moves all extremities equally; strength 5/5 in all 4 extremities, sensation to light touch intact diffusely, cranial nerves II through XII intact, 2+ deep tendon reflexes in bilateral upper and lower extremities, no clonus, normal gait PSYCH: The patient's mood and manner are appropriate. Grooming and personal hygiene are appropriate.  MEDICAL DECISION MAKING: Patient here with back pain. Suspect musculoskeletal back pain. We'll discharge with prescriptions for Percocet. Reports he cannot take NSAIDs because they cause swelling. Have advised him to follow-up with his PCP. I do not feel he needs emergent imaging at this time as he is neurologically intact with no red flag symptoms. He verbalizes understanding and is comfortable with this plan.  ED PROGRESS: Records from [facility] on 07/16/14 show urine with trace Hb and CT [facility] shows  subxiphoid and umbilical hernias and no other acute abnormality.     [place], DO 07/25/14 2220

## 2014-07-25 NOTE — Discharge Instructions (Signed)
Lumbosacral Strain Lumbosacral strain is a strain of any of the parts that make up your lumbosacral vertebrae. Your lumbosacral vertebrae are the bones that make up the lower third of your backbone. Your lumbosacral vertebrae are held together by muscles and tough, fibrous tissue (ligaments).  CAUSES  A sudden blow to your back can cause lumbosacral strain. Also, anything that causes an excessive stretch of the muscles in the low back can cause this strain. This is typically seen when people exert themselves strenuously, fall, lift heavy objects, bend, or crouch repeatedly. RISK FACTORS  Physically demanding work.  Participation in pushing or pulling sports or sports that require a sudden twist of the back (tennis, golf, baseball).  Weight lifting.  Excessive lower back curvature.  Forward-tilted pelvis.  Weak back or abdominal muscles or both.  Tight hamstrings. SIGNS AND SYMPTOMS  Lumbosacral strain may cause pain in the area of your injury or pain that moves (radiates) down your leg.  DIAGNOSIS Your health care provider can often diagnose lumbosacral strain through a physical exam. In some cases, you may need tests such as X-ray exams.  TREATMENT  Treatment for your lower back injury depends on many factors that your clinician will have to evaluate. However, most treatment will include the use of anti-inflammatory medicines. HOME CARE INSTRUCTIONS   Avoid hard physical activities (tennis, racquetball, waterskiing) if you are not in proper physical condition for it. This may aggravate or create problems.  If you have a back problem, avoid sports requiring sudden body movements. Swimming and walking are generally safer activities.  Maintain good posture.  Maintain a healthy weight.  For acute conditions, you may put ice on the injured area.  Put ice in a plastic bag.  Place a towel between your skin and the bag.  Leave the ice on for 20 minutes, 2-3 times a day.  When the  low back starts healing, stretching and strengthening exercises may be recommended. SEEK MEDICAL CARE IF:  Your back pain is getting worse.  You experience severe back pain not relieved with medicines. SEEK IMMEDIATE MEDICAL CARE IF:   You have numbness, tingling, weakness, or problems with the use of your arms or legs.  There is a change in bowel or bladder control.  You have increasing pain in any area of the body, including your belly (abdomen).  You notice shortness of breath, dizziness, or feel faint.  You feel sick to your stomach (nauseous), are throwing up (vomiting), or become sweaty.  You notice discoloration of your toes or legs, or your feet get very cold. MAKE SURE YOU:   Understand these instructions.  Will watch your condition.  Will get help right away if you are not doing well or get worse. Document Released: 04/10/2005 Document Revised: 07/06/2013 Document Reviewed: 02/17/2013 [facility] Patient Information 2015 [place], [place]. This information is not intended to replace advice given to you by your health care provider. Make sure you discuss any questions you have with your health care provider.  Sciatica Sciatica is pain, weakness, numbness, or tingling along the path of the sciatic nerve. The nerve starts in the lower back and runs down the back of each leg. The nerve controls the muscles in the lower leg and in the back of the knee, while also providing sensation to the back of the thigh, lower leg, and the sole of your foot. Sciatica is a symptom of another medical condition. For instance, nerve damage or certain conditions, such as a herniated disk or  bone spur on the spine, pinch or put pressure on the sciatic nerve. This causes the pain, weakness, or other sensations normally associated with sciatica. Generally, sciatica only affects one side of the body. CAUSES   Herniated or slipped disc.  Degenerative disk disease.  A pain disorder involving the narrow  muscle in the buttocks (piriformis syndrome).  Pelvic injury or fracture.  Pregnancy.  Tumor (rare). SYMPTOMS  Symptoms can vary from mild to very severe. The symptoms usually travel from the low back to the buttocks and down the back of the leg. Symptoms can include:  Mild tingling or dull aches in the lower back, leg, or hip.  Numbness in the back of the calf or sole of the foot.  Burning sensations in the lower back, leg, or hip.  Sharp pains in the lower back, leg, or hip.  Leg weakness.  Severe back pain inhibiting movement. These symptoms may get worse with coughing, sneezing, laughing, or prolonged sitting or standing. Also, being overweight may worsen symptoms. DIAGNOSIS  Your caregiver will perform a physical exam to look for common symptoms of sciatica. He or she may ask you to do certain movements or activities that would trigger sciatic nerve pain. Other tests may be performed to find the cause of the sciatica. These may include:  Blood tests.  X-rays.  Imaging tests, such as an MRI or CT scan. TREATMENT  Treatment is directed at the cause of the sciatic pain. Sometimes, treatment is not necessary and the pain and discomfort goes away on its own. If treatment is needed, your caregiver may suggest:  Over-the-counter medicines to relieve pain.  Prescription medicines, such as anti-inflammatory medicine, muscle relaxants, or narcotics.  Applying heat or ice to the painful area.  Steroid injections to lessen pain, irritation, and inflammation around the nerve.  Reducing activity during periods of pain.  Exercising and stretching to strengthen your abdomen and improve flexibility of your spine. Your caregiver may suggest losing weight if the extra weight makes the back pain worse.  Physical therapy.  Surgery to eliminate what is pressing or pinching the nerve, such as a bone spur or part of a herniated disk. HOME CARE INSTRUCTIONS   Only take over-the-counter  or prescription medicines for pain or discomfort as directed by your caregiver.  Apply ice to the affected area for 20 minutes, 3-4 times a day for the first 48-72 hours. Then try heat in the same way.  Exercise, stretch, or perform your usual activities if these do not aggravate your pain.  Attend physical therapy sessions as directed by your caregiver.  Keep all follow-up appointments as directed by your caregiver.  Do not wear high heels or shoes that do not provide proper support.  Check your mattress to see if it is too soft. A firm mattress may lessen your pain and discomfort. SEEK IMMEDIATE MEDICAL CARE IF:   You lose control of your bowel or bladder (incontinence).  You have increasing weakness in the lower back, pelvis, buttocks, or legs.  You have redness or swelling of your back.  You have a burning sensation when you urinate.  You have pain that gets worse when you lie down or awakens you at night.  Your pain is worse than you have experienced in the past.  Your pain is lasting longer than 4 weeks.  You are suddenly losing weight without reason. MAKE SURE YOU:  Understand these instructions.  Will watch your condition.  Will get help right away if  you are not doing well or get worse. Document Released: 06/25/2001 Document Revised: 12/31/2011 Document Reviewed: 11/10/2011 [facility] Patient Information 2015 [place], [place]. This information is not intended to replace advice given to you by your health care provider. Make sure you discuss any questions you have with your health care provider.

## 2014-07-25 NOTE — ED Notes (Signed)
Low back pain since 12/29 , no injury, Seen at [facility] and had ct scan done to eval for kidney stone

## 2014-08-09 DIAGNOSIS — I10 Essential (primary) hypertension: Secondary | ICD-10-CM | POA: Diagnosis not present

## 2014-08-09 DIAGNOSIS — F17210 Nicotine dependence, cigarettes, uncomplicated: Secondary | ICD-10-CM | POA: Diagnosis not present

## 2014-08-09 DIAGNOSIS — E782 Mixed hyperlipidemia: Secondary | ICD-10-CM | POA: Diagnosis not present

## 2014-08-09 DIAGNOSIS — F331 Major depressive disorder, recurrent, moderate: Secondary | ICD-10-CM | POA: Diagnosis not present

## 2014-08-09 DIAGNOSIS — E119 Type 2 diabetes mellitus without complications: Secondary | ICD-10-CM | POA: Diagnosis not present

## 2014-08-12 DIAGNOSIS — G4733 Obstructive sleep apnea (adult) (pediatric): Secondary | ICD-10-CM | POA: Diagnosis not present

## 2014-08-12 DIAGNOSIS — Z9189 Other specified personal risk factors, not elsewhere classified: Secondary | ICD-10-CM | POA: Diagnosis not present

## 2014-08-12 DIAGNOSIS — F331 Major depressive disorder, recurrent, moderate: Secondary | ICD-10-CM | POA: Diagnosis not present

## 2014-08-12 DIAGNOSIS — Z1389 Encounter for screening for other disorder: Secondary | ICD-10-CM | POA: Diagnosis not present

## 2014-08-12 DIAGNOSIS — G6289 Other specified polyneuropathies: Secondary | ICD-10-CM | POA: Diagnosis not present

## 2014-08-12 DIAGNOSIS — E119 Type 2 diabetes mellitus without complications: Secondary | ICD-10-CM | POA: Diagnosis not present

## 2014-08-12 DIAGNOSIS — E8881 Metabolic syndrome: Secondary | ICD-10-CM | POA: Diagnosis not present

## 2014-08-12 DIAGNOSIS — I10 Essential (primary) hypertension: Secondary | ICD-10-CM | POA: Diagnosis not present

## 2014-08-12 DIAGNOSIS — F17210 Nicotine dependence, cigarettes, uncomplicated: Secondary | ICD-10-CM | POA: Diagnosis not present

## 2014-08-12 DIAGNOSIS — F410 Panic disorder [episodic paroxysmal anxiety] without agoraphobia: Secondary | ICD-10-CM | POA: Diagnosis not present

## 2014-08-12 DIAGNOSIS — E782 Mixed hyperlipidemia: Secondary | ICD-10-CM | POA: Diagnosis not present

## 2014-08-18 DIAGNOSIS — E119 Type 2 diabetes mellitus without complications: Secondary | ICD-10-CM | POA: Diagnosis not present

## 2014-08-18 DIAGNOSIS — E782 Mixed hyperlipidemia: Secondary | ICD-10-CM | POA: Diagnosis not present

## 2014-08-18 DIAGNOSIS — I10 Essential (primary) hypertension: Secondary | ICD-10-CM | POA: Diagnosis not present

## 2014-10-03 ENCOUNTER — Encounter: Payer: Self-pay | Admitting: Pharmacist

## 2014-10-03 ENCOUNTER — Other Ambulatory Visit: Payer: Self-pay | Admitting: Pharmacist

## 2014-10-03 NOTE — Patient Outreach (Signed)
Mr. Oswalt is a 49 year old male being following by [place] for smoking cessation.  Patient reports not having smoked for 2 weeks. He reports that he continues to use nicotine replacement patches and hard candies.  He denies that he needs any pharmacy assistance currently.  Will follow up with patient in two more weeks and if he has still abstained from tobacco use. Will close him out of the pharmacy smoking cessation program for [facility].   Nicoletta Ba, PharmD, [facility] Resident [phone]

## 2014-10-17 ENCOUNTER — Other Ambulatory Visit: Payer: Medicare Other | Admitting: Pharmacist

## 2014-10-21 ENCOUNTER — Encounter: Payer: Self-pay | Admitting: Cardiology

## 2014-10-21 ENCOUNTER — Ambulatory Visit (INDEPENDENT_AMBULATORY_CARE_PROVIDER_SITE_OTHER): Payer: Medicare Other | Admitting: Cardiology

## 2014-10-21 VITALS — BP 178/70 | HR 93 | Ht 71.0 in | Wt 279.0 lb

## 2014-10-21 DIAGNOSIS — E785 Hyperlipidemia, unspecified: Secondary | ICD-10-CM | POA: Diagnosis not present

## 2014-10-21 DIAGNOSIS — F17201 Nicotine dependence, unspecified, in remission: Secondary | ICD-10-CM

## 2014-10-21 DIAGNOSIS — I10 Essential (primary) hypertension: Secondary | ICD-10-CM | POA: Diagnosis not present

## 2014-10-21 DIAGNOSIS — I2510 Atherosclerotic heart disease of native coronary artery without angina pectoris: Secondary | ICD-10-CM | POA: Diagnosis not present

## 2014-10-21 NOTE — Progress Notes (Signed)
Cardiology Office Note  Date: 10/21/2014   ID: [name], DOB 11-15-1965, MRN [id number]  PCP: Gar Ponto, MD  Primary Cardiologist: Rozann Lesches, MD   Chief Complaint  Patient presents with  . Coronary Artery Disease  . Cardiomyopathy  . Hyperlipidemia    History of Present Illness: [name] is a medically complex 49 y.o. male last seen in January. He presents for a routine follow-up visit. In the last 3 weeks he states that he has stopped smoking and is also modifying his diet, trying to lose some weight along with his wife. They do plan to start exercising together as well. He reports NYHA class 2-3 dyspnea, but no angina symptoms.  We reviewed his cardiac medications which are outlined below. He reports compliance, had not taken them yet this morning however because he had not eaten. His blood pressure is elevated today. He reports no unusual bleeding problems on DAPT.  He continues to follow with Dr. Quillian Quince for primary care.  Most recent cardiac catheterization done back in April 2015 in the setting of medication noncompliance and NSTEMI demonstrated severe multivessel disease with patency of the LIMA to LAD, SVG to first diagonal, SVG to OM1 and OM 2, and SVG to PDA.  Past Medical History  Diagnosis Date  . Mixed hyperlipidemia   . Essential hypertension, benign   . COPD (chronic obstructive pulmonary disease)   . Depression   . Anaphylaxis     IGE mediated  . Myocardial infarction 2010  . Anxiety   . Arthritis   . Type 2 diabetes mellitus   . OSA (obstructive sleep apnea)   . History of CVA (cerebrovascular accident) 01/2012    Right posterior frontal cortical and subcortical brain by MRI, no hemorrhage. Carotid Dopplers showed only 1-50% bilateral ICA stenoses. Echocardiogram showed LVEF 50%, no major valvular abnormalities.  . Coronary atherosclerosis of native coronary artery     Multivessel status post CABG 2010  . Cardiomyopathy      Past Surgical History  Procedure Laterality Date  . Gastric bypass  2010  . Hernia repair  2011, 2012  . Toe amputation  1998    right 1st and 2nd toe  . Tonsilectomy, adenoidectomy, bilateral myringotomy and tubes    . Dental surgery  2003  . Cholecystectomy    . Coronary artery bypass graft  2010    LIMA to LAD, SVG to diagonal, SVG to OM1 and OM 2, SVG to RCA  . Tonsillectomy    . Ventral hernia repair N/A 10/28/2012    Procedure: LAPAROSCOPIC VENTRAL HERNIA;  Surgeon: Donato Heinz, MD;  Location: [facility];  Service: General;  Laterality: N/A;  . Incisional hernia repair N/A 07/23/2013    Procedure: HERNIA REPAIR INCISIONAL WITH MESH;  Surgeon: Jamesetta So, MD;  Location: [facility];  Service: General;  Laterality: N/A;  . Insertion of mesh N/A 07/23/2013    Procedure: INSERTION OF MESH;  Surgeon: Jamesetta So, MD;  Location: [facility];  Service: General;  Laterality: N/A;  . Left heart catheterization with coronary/graft angiogram N/A 11/01/2013    Procedure: LEFT HEART CATHETERIZATION WITH Beatrix Fetters;  Surgeon: Blane Ohara, MD;  Location: [facility] CATH LAB;  Service: Cardiovascular;  Laterality: N/A;    Current Outpatient Prescriptions  Medication Sig Dispense Refill  . acetaminophen (TYLENOL) 500 MG tablet Take 1,000 mg by mouth every 6 (six) hours as needed for mild pain, moderate pain or headache.     [name]  albuterol (PROVENTIL HFA;VENTOLIN HFA) 108 (90 BASE) MCG/ACT inhaler Inhale 2 puffs into the lungs every 6 (six) hours as needed for wheezing or shortness of breath.    . ALPRAZolam (XANAX) 1 MG tablet Take 1 mg by mouth 4 (four) times daily as needed for anxiety.     [name] amLODipine (NORVASC) 10 MG tablet Take 10 mg by mouth daily.    [name] aspirin EC 325 MG tablet Take 325 mg by mouth daily.    . carvedilol (COREG) 25 MG tablet Take 1 tablet (25 mg total) by mouth 2 (two) times daily with a meal. 60 tablet 6  . citalopram (CELEXA) 40 MG tablet Take 20 mg by mouth daily.     . cloNIDine (CATAPRES) 0.1 MG tablet Take 1 tablet (0.1 mg total) by mouth daily as needed (*Only taken for blood pressure levels that are >170/100*). 60 tablet 2  . clopidogrel (PLAVIX) 75 MG tablet Take 1 tablet (75 mg total) by mouth daily with breakfast. 30 tablet 11  . docusate sodium (COLACE) 100 MG capsule Take 1 capsule (100 mg total) by mouth every 12 (twelve) hours. 60 capsule 0  . isosorbide mononitrate (IMDUR) 60 MG 24 hr tablet Take 60 mg by mouth 2 (two) times daily.     [name] losartan (COZAAR) 100 MG tablet Take 1 tablet (100 mg total) by mouth daily. 90 tablet 3  . nicotine (NICODERM CQ - DOSED IN MG/24 HOURS) 21 mg/24hr patch Place 21 mg onto the skin daily.    . nitroGLYCERIN (NITROSTAT) 0.4 MG SL tablet Place 0.4 mg under the tongue every 5 (five) minutes as needed. For chest pain    . rosuvastatin (CRESTOR) 40 MG tablet Take 40 mg by mouth daily.    [name] spironolactone (ALDACTONE) 25 MG tablet Take 1 tablet (25 mg total) by mouth daily. 90 tablet 3  . traMADol (ULTRAM) 50 MG tablet Take 50 mg by mouth 4 (four) times daily. For pain     No current facility-administered medications for this visit.    Allergies:  Contrast media; Ibuprofen; and Nsaids   Social History: The patient  reports that he quit smoking about 3 weeks ago. His smoking use included Cigarettes. He started smoking about 32 years ago. He has a 22.5 pack-year smoking history. He has never used smokeless tobacco. He reports that he drinks alcohol. He reports that he does not use illicit drugs.    ROS:  Please see the history of present illness. Otherwise, complete review of systems is positive for some cravings with tobacco cessation.  All other systems are reviewed and negative.   Physical Exam: VS:  BP 178/70 mmHg  Pulse 93  Ht 5\' 11"  (1.803 m)  Wt 279 lb (126.554 kg)  BMI 38.93 kg/m2  SpO2 97%, BMI Body mass index is 38.93 kg/(m^2).  Wt Readings from Last 3 Encounters:  10/21/14 279 lb (126.554 kg)   07/25/14 267 lb (121.11 kg)  07/19/14 267 lb (121.11 kg)     General: Patient appears comfortable at rest. HEENT: Conjunctiva and lids normal, oropharynx clear with moist mucosa. Neck: Supple, no elevated JVP or carotid bruits, no thyromegaly. Lungs: Clear to auscultation, nonlabored breathing at rest. Cardiac: Regular rate and rhythm, no S3 or significant systolic murmur, no pericardial rub. Abdomen: Soft, nontender, no hepatomegaly, bowel sounds present, no guarding or rebound. Extremities: No pitting edema, distal pulses 2+. Skin: Warm and dry. Musculoskeletal: No kyphosis. Neuropsychiatric: Alert and oriented x3, affect grossly appropriate.   ECG:  ECG is not ordered today.   Recent Labwork: 10/31/2013: ALT 41; AST 53*; TSH 1.500 04/28/2014: BUN 16; Creatinine 1.04; Hemoglobin 14.6; Platelets 179; Potassium 3.9; Sodium 139     Component Value Date/Time   CHOL 156 11/01/2013 0710   TRIG 185* 11/01/2013 0710   HDL 27* 11/01/2013 0710   CHOLHDL 5.8 11/01/2013 0710   VLDL 37 11/01/2013 0710   LDLCALC 92 11/01/2013 0710    Other Studies Reviewed Today:  Echocardiogram 01/20/2014: Study Conclusions  - Procedure narrative: Transthoracic echocardiography. Image quality was poor. The study was technically difficult, as a result of poor sound wave transmission and body habitus. Intravenous contrast (Definity) was administered. - Left ventricle: The cavity size was mildly dilated. Systolic function was low normal to mildly reduced. The estimated ejection fraction was in the range of 45% to 50%. Features are consistent with a pseudonormal left ventricular filling pattern, with concomitant abnormal relaxation and increased filling pressure (grade 2 diastolic dysfunction). Mildly elevated filling pressures. Mild posterior wall thickening. - Regional wall motion abnormality: Moderate hypokinesis of the mid-apical anterior, mid anteroseptal, mid anterolateral,  apical septal, apical lateral, and apical myocardium. - Aortic valve: There was mild regurgitation. - Left atrium: The atrium was mildly dilated.   Assessment and Plan:  1. Multivessel CAD status post CABG, symptomatically stable without angina symptoms. He has had an associated ischemic cardiomyopathy, although LVEF 45-50% by assessment in July 2015 on medical therapy. Continue observation. I encouraged him in his efforts to pursue diet, exercise, and weight loss.  2. Tobacco abuse in remission, recently quit smoking about 3 weeks ago.  3. Essential hypertension, blood pressure elevated today prior to taking regular medications. He otherwise reports compliance.  4. Hyperlipidemia, on Crestor. Keep follow-up with Dr. Quillian Quince.  Current medicines were reviewed with the patient today.  Disposition: FU with me in 4 months.   Signed, Satira Sark, MD, [facility] 10/21/2014 10:00 AM    [facility] [place], [place], [place] [address] Phone: [phone]; Fax: [phone]

## 2014-10-21 NOTE — Patient Instructions (Signed)
Continue all current medications. Follow up in  4 months  

## 2014-10-24 ENCOUNTER — Encounter: Payer: Self-pay | Admitting: Licensed Clinical Social Worker

## 2014-10-24 NOTE — Patient Outreach (Signed)
[place] [facility]) Care Management  [facility] Social Work  10/24/2014  [name] 06/06/1966 [id number]  Subjective:    Objective:   Current Medications:  Current Outpatient Prescriptions  Medication Sig Dispense Refill  . acetaminophen (TYLENOL) 500 MG tablet Take 1,000 mg by mouth every 6 (six) hours as needed for mild pain, moderate pain or headache.     . albuterol (PROVENTIL HFA;VENTOLIN HFA) 108 (90 BASE) MCG/ACT inhaler Inhale 2 puffs into the lungs every 6 (six) hours as needed for wheezing or shortness of breath.    . ALPRAZolam (XANAX) 1 MG tablet Take 1 mg by mouth 4 (four) times daily as needed for anxiety.     [name] amLODipine (NORVASC) 10 MG tablet Take 10 mg by mouth daily.    [name] aspirin EC 325 MG tablet Take 325 mg by mouth daily.    . carvedilol (COREG) 25 MG tablet Take 1 tablet (25 mg total) by mouth 2 (two) times daily with a meal. 60 tablet 6  . citalopram (CELEXA) 40 MG tablet Take 20 mg by mouth daily.    . cloNIDine (CATAPRES) 0.1 MG tablet Take 1 tablet (0.1 mg total) by mouth daily as needed (*Only taken for blood pressure levels that are >170/100*). 60 tablet 2  . clopidogrel (PLAVIX) 75 MG tablet Take 1 tablet (75 mg total) by mouth daily with breakfast. 30 tablet 11  . docusate sodium (COLACE) 100 MG capsule Take 1 capsule (100 mg total) by mouth every 12 (twelve) hours. 60 capsule 0  . isosorbide mononitrate (IMDUR) 60 MG 24 hr tablet Take 60 mg by mouth 2 (two) times daily.     [name] losartan (COZAAR) 100 MG tablet Take 1 tablet (100 mg total) by mouth daily. 90 tablet 3  . nicotine (NICODERM CQ - DOSED IN MG/24 HOURS) 21 mg/24hr patch Place 21 mg onto the skin daily.    . nitroGLYCERIN (NITROSTAT) 0.4 MG SL tablet Place 0.4 mg under the tongue every 5 (five) minutes as needed. For chest pain    . rosuvastatin (CRESTOR) 40 MG tablet Take 40 mg by mouth daily.    [name] spironolactone (ALDACTONE) 25 MG tablet Take 1 tablet (25 mg total) by mouth daily.  90 tablet 3  . traMADol (ULTRAM) 50 MG tablet Take 50 mg by mouth 4 (four) times daily. For pain     No current facility-administered medications for this visit.    Functional Status:  In your present state of health, do you have any difficulty performing the following activities: 04/28/2014 10/31/2013  Hearing? N -  Vision? N -  Difficulty concentrating or making decisions? N -  Walking or climbing stairs? N -  Dressing or bathing? N -  Doing errands, shopping? N N    Fall/Depression Screening:  No flowsheet data found.  Assessment:   Previous documentation in [facility] record.  CSW completed chart review on client on 10/24/14.  Client has worked with [name] in the past and met nursing goals with [facility].   Client is now discharged from [facility].  Client has worked with [facility] in the past to assist client with smoking cessation program.  Client has been participating in smoking cessation program and is pleased with progress in smoking cessation program.  CSW has given client information two times on the name and phone number for the [employer] in [place], [place].  Client has communicated several times with this  office.  Basic clinical social work goals of client have been met at this time.  Client continues to meet with his primary doctor as needed.  Client has support, as needed, from family members  CSW is discharging client from [facility] clinical social work services on 10/24/14 due to fact that client's social work goals have been met.    Plan:  CSW to inform Lurline Del on 10/24/14 that [facility] discharged client on 10/24/14 due to social work goals being met.  CSW to send letter to primary doctor for client informing primary doctor that client was discharged from clinical social work services on 10/24/14 due to fact that clinical social work goals for client were met.Norva Riffle.[name] MSW, LCSW Licensed Clinical Social  Worker [facility] Care Management [phone]  Plan:

## 2014-10-26 ENCOUNTER — Encounter: Payer: Self-pay | Admitting: Licensed Clinical Social Worker

## 2014-11-02 ENCOUNTER — Other Ambulatory Visit: Payer: Self-pay | Admitting: Pharmacist

## 2014-11-02 NOTE — Patient Outreach (Signed)
[place] [facility]) Care Management  [place]   11/02/2014  [name] [birth year] [id number]  Subjective: [name] is a 49 y.o. male being followed by pharmacy for smoking cessation. I called the patient today close him out of the pharmacy program due to successful completion. Patient reported that he is still tobacco free and is having no issues with remaining tobacco free. He understands that he has successfully met his goal with the smoking cessation program and that I will close him out of the program. I am the last [facility] care management team member involved and therefore the closure of this program will result in the closure of his case. Patient verbalized understanding.   Objective:   Current Medications: Current Outpatient Prescriptions  Medication Sig Dispense Refill  . acetaminophen (TYLENOL) 500 MG tablet Take 1,000 mg by mouth every 6 (six) hours as needed for mild pain, moderate pain or headache.     . albuterol (PROVENTIL HFA;VENTOLIN HFA) 108 (90 BASE) MCG/ACT inhaler Inhale 2 puffs into the lungs every 6 (six) hours as needed for wheezing or shortness of breath.    . ALPRAZolam (XANAX) 1 MG tablet Take 1 mg by mouth 4 (four) times daily as needed for anxiety.     [name] amLODipine (NORVASC) 10 MG tablet Take 10 mg by mouth daily.    [name] aspirin EC 325 MG tablet Take 325 mg by mouth daily.    . carvedilol (COREG) 25 MG tablet Take 1 tablet (25 mg total) by mouth 2 (two) times daily with a meal. 60 tablet 6  . citalopram (CELEXA) 40 MG tablet Take 20 mg by mouth daily.    . cloNIDine (CATAPRES) 0.1 MG tablet Take 1 tablet (0.1 mg total) by mouth daily as needed (*Only taken for blood pressure levels that are >170/100*). 60 tablet 2  . clopidogrel (PLAVIX) 75 MG tablet Take 1 tablet (75 mg total) by mouth daily with breakfast. 30 tablet 11  . docusate sodium (COLACE) 100 MG capsule Take 1 capsule (100 mg total) by mouth every 12 (twelve) hours. 60  capsule 0  . isosorbide mononitrate (IMDUR) 60 MG 24 hr tablet Take 60 mg by mouth 2 (two) times daily.     [name] losartan (COZAAR) 100 MG tablet Take 1 tablet (100 mg total) by mouth daily. 90 tablet 3  . nicotine (NICODERM CQ - DOSED IN MG/24 HOURS) 21 mg/24hr patch Place 21 mg onto the skin daily.    . nitroGLYCERIN (NITROSTAT) 0.4 MG SL tablet Place 0.4 mg under the tongue every 5 (five) minutes as needed. For chest pain    . rosuvastatin (CRESTOR) 40 MG tablet Take 40 mg by mouth daily.    [name] spironolactone (ALDACTONE) 25 MG tablet Take 1 tablet (25 mg total) by mouth daily. 90 tablet 3  . traMADol (ULTRAM) 50 MG tablet Take 50 mg by mouth 4 (four) times daily. For pain     No current facility-administered medications for this visit.    Functional Status: In your present state of health, do you have any difficulty performing the following activities: 04/28/2014  Hearing? N  Vision? N  Difficulty concentrating or making decisions? N  Walking or climbing stairs? N  Dressing or bathing? N  Doing errands, shopping? N    Fall/Depression Screening: No flowsheet data found.  Assessment: 1. Smoking cessation: successful smoking cessation.  Plan: 1. Congratulated patient on success. Will close out of pharmacy program, and will contact Lurline Del, care  management assistant, for patient case closure.  Nicoletta Ba, PharmD, [facility] Resident [phone]

## 2014-11-08 NOTE — Patient Outreach (Signed)
[place] [facility]) Care Management  11/08/2014  KHALE NIGH 01/17/1966 [id number]   Received notification from Nicoletta Ba, [facility] to close case due to goals met.  Ronnell Freshwater. [place] CM Assistant Phone: [phone] Fax: [phone]

## 2014-11-15 ENCOUNTER — Other Ambulatory Visit: Payer: Self-pay | Admitting: Physician Assistant

## 2014-11-17 DIAGNOSIS — E782 Mixed hyperlipidemia: Secondary | ICD-10-CM | POA: Diagnosis not present

## 2014-11-17 DIAGNOSIS — K210 Gastro-esophageal reflux disease with esophagitis: Secondary | ICD-10-CM | POA: Diagnosis not present

## 2014-11-17 DIAGNOSIS — I10 Essential (primary) hypertension: Secondary | ICD-10-CM | POA: Diagnosis not present

## 2014-11-17 DIAGNOSIS — E119 Type 2 diabetes mellitus without complications: Secondary | ICD-10-CM | POA: Diagnosis not present

## 2014-11-22 DIAGNOSIS — E119 Type 2 diabetes mellitus without complications: Secondary | ICD-10-CM | POA: Diagnosis not present

## 2014-11-22 DIAGNOSIS — E782 Mixed hyperlipidemia: Secondary | ICD-10-CM | POA: Diagnosis not present

## 2014-11-22 DIAGNOSIS — F331 Major depressive disorder, recurrent, moderate: Secondary | ICD-10-CM | POA: Diagnosis not present

## 2014-11-22 DIAGNOSIS — F410 Panic disorder [episodic paroxysmal anxiety] without agoraphobia: Secondary | ICD-10-CM | POA: Diagnosis not present

## 2014-11-22 DIAGNOSIS — I10 Essential (primary) hypertension: Secondary | ICD-10-CM | POA: Diagnosis not present

## 2014-11-22 DIAGNOSIS — G6289 Other specified polyneuropathies: Secondary | ICD-10-CM | POA: Diagnosis not present

## 2014-11-22 DIAGNOSIS — E8881 Metabolic syndrome: Secondary | ICD-10-CM | POA: Diagnosis not present

## 2014-11-22 DIAGNOSIS — G4733 Obstructive sleep apnea (adult) (pediatric): Secondary | ICD-10-CM | POA: Diagnosis not present

## 2014-11-22 DIAGNOSIS — I2510 Atherosclerotic heart disease of native coronary artery without angina pectoris: Secondary | ICD-10-CM | POA: Diagnosis not present

## 2014-11-22 DIAGNOSIS — J4530 Mild persistent asthma, uncomplicated: Secondary | ICD-10-CM | POA: Diagnosis not present

## 2014-11-22 DIAGNOSIS — F17210 Nicotine dependence, cigarettes, uncomplicated: Secondary | ICD-10-CM | POA: Diagnosis not present

## 2014-12-27 NOTE — Patient Outreach (Signed)
[place] [facility]) Care Management  12/27/2014  [name] 12/04/1965 [id number]   High Risk List referral with MD notes, assigned [name], RN to outreach patient.  Ronnell Freshwater. [place], [place] Management [place] Assistant Phone: [phone] Fax: [phone]

## 2014-12-28 DIAGNOSIS — E1140 Type 2 diabetes mellitus with diabetic neuropathy, unspecified: Secondary | ICD-10-CM | POA: Diagnosis not present

## 2014-12-28 DIAGNOSIS — E119 Type 2 diabetes mellitus without complications: Secondary | ICD-10-CM | POA: Diagnosis not present

## 2014-12-28 DIAGNOSIS — E785 Hyperlipidemia, unspecified: Secondary | ICD-10-CM | POA: Diagnosis not present

## 2014-12-28 DIAGNOSIS — Z79899 Other long term (current) drug therapy: Secondary | ICD-10-CM | POA: Diagnosis not present

## 2014-12-28 DIAGNOSIS — R5383 Other fatigue: Secondary | ICD-10-CM | POA: Diagnosis not present

## 2014-12-28 DIAGNOSIS — G4733 Obstructive sleep apnea (adult) (pediatric): Secondary | ICD-10-CM | POA: Diagnosis not present

## 2014-12-28 DIAGNOSIS — M818 Other osteoporosis without current pathological fracture: Secondary | ICD-10-CM | POA: Diagnosis not present

## 2014-12-30 ENCOUNTER — Other Ambulatory Visit: Payer: Self-pay | Admitting: *Deleted

## 2014-12-30 ENCOUNTER — Encounter: Payer: Self-pay | Admitting: *Deleted

## 2014-12-30 NOTE — Patient Outreach (Signed)
12/30/14- Referral received from primary MD (data analysis)- telephone call to patient, HIPAA verfied, pt states his "neuropathy is worse" and there is a cream being compounded for him prescribed by Dr. Merlene Laughter that pt will get in the next few days, pt has had " nerve tests" done.  Pt reports he has all medications, taking as prescribed and can afford, pt states he is "smoking again" and using nicotine patches and says " I"ve almost quit again"  Pt denies having any recent chest pain, has transportation and drives, pt states his wife is a Marine scientist and she checks his blood pressure and logs, pt states " blood pressure has been good although up and down at times"  Pt has lost 10 pounds and is continuing to work towards exercising and losing weight.  Pt reminded RN CM that he was in [facility] program few months ago and states " I appreciate everything that was done but I don't know of anything else we can work on right now" Pt refuses RN CM for community home visits and refuses RN health coach for telephonic contact citing " I don't have any needs right now".   Letter mailed to Dr. Quillian Quince informing pt refuses [facility] program In Basket to Lurline Del to close case.  [name] [facility] [place] Coordinator [phone]

## 2015-01-03 NOTE — Patient Outreach (Signed)
[facility] [facility]) Care Management  01/03/2015  TALEB CARMICAL 1965-10-03 [id number]   Notification from [name], RN to close case due to patient refused to participate in [facility] Management services.  Ronnell Freshwater. [place], [place] Management [place] Assistant Phone: [phone] Fax: [phone]

## 2015-01-16 DIAGNOSIS — S161XXA Strain of muscle, fascia and tendon at neck level, initial encounter: Secondary | ICD-10-CM | POA: Diagnosis not present

## 2015-01-16 DIAGNOSIS — Z7902 Long term (current) use of antithrombotics/antiplatelets: Secondary | ICD-10-CM | POA: Diagnosis not present

## 2015-01-16 DIAGNOSIS — Z7982 Long term (current) use of aspirin: Secondary | ICD-10-CM | POA: Diagnosis not present

## 2015-01-16 DIAGNOSIS — S199XXA Unspecified injury of neck, initial encounter: Secondary | ICD-10-CM | POA: Diagnosis not present

## 2015-01-16 DIAGNOSIS — M542 Cervicalgia: Secondary | ICD-10-CM | POA: Diagnosis not present

## 2015-01-16 DIAGNOSIS — F17200 Nicotine dependence, unspecified, uncomplicated: Secondary | ICD-10-CM | POA: Diagnosis not present

## 2015-01-16 DIAGNOSIS — I10 Essential (primary) hypertension: Secondary | ICD-10-CM | POA: Diagnosis not present

## 2015-01-16 DIAGNOSIS — M549 Dorsalgia, unspecified: Secondary | ICD-10-CM | POA: Diagnosis not present

## 2015-01-16 DIAGNOSIS — F329 Major depressive disorder, single episode, unspecified: Secondary | ICD-10-CM | POA: Diagnosis not present

## 2015-01-16 DIAGNOSIS — Z79899 Other long term (current) drug therapy: Secondary | ICD-10-CM | POA: Diagnosis not present

## 2015-01-16 DIAGNOSIS — S3992XA Unspecified injury of lower back, initial encounter: Secondary | ICD-10-CM | POA: Diagnosis not present

## 2015-01-16 DIAGNOSIS — E119 Type 2 diabetes mellitus without complications: Secondary | ICD-10-CM | POA: Diagnosis not present

## 2015-01-16 DIAGNOSIS — M545 Low back pain: Secondary | ICD-10-CM | POA: Diagnosis not present

## 2015-01-16 DIAGNOSIS — W01198A Fall on same level from slipping, tripping and stumbling with subsequent striking against other object, initial encounter: Secondary | ICD-10-CM | POA: Diagnosis not present

## 2015-01-17 DIAGNOSIS — M549 Dorsalgia, unspecified: Secondary | ICD-10-CM | POA: Diagnosis not present

## 2015-01-17 DIAGNOSIS — M542 Cervicalgia: Secondary | ICD-10-CM | POA: Diagnosis not present

## 2015-01-17 DIAGNOSIS — S199XXA Unspecified injury of neck, initial encounter: Secondary | ICD-10-CM | POA: Diagnosis not present

## 2015-01-17 DIAGNOSIS — S3992XA Unspecified injury of lower back, initial encounter: Secondary | ICD-10-CM | POA: Diagnosis not present

## 2015-01-18 DIAGNOSIS — M5416 Radiculopathy, lumbar region: Secondary | ICD-10-CM | POA: Diagnosis not present

## 2015-01-18 DIAGNOSIS — M79606 Pain in leg, unspecified: Secondary | ICD-10-CM | POA: Diagnosis not present

## 2015-01-18 DIAGNOSIS — E1140 Type 2 diabetes mellitus with diabetic neuropathy, unspecified: Secondary | ICD-10-CM | POA: Diagnosis not present

## 2015-01-31 DIAGNOSIS — S161XXA Strain of muscle, fascia and tendon at neck level, initial encounter: Secondary | ICD-10-CM | POA: Diagnosis not present

## 2015-01-31 DIAGNOSIS — M545 Low back pain: Secondary | ICD-10-CM | POA: Diagnosis not present

## 2015-01-31 DIAGNOSIS — I10 Essential (primary) hypertension: Secondary | ICD-10-CM | POA: Diagnosis not present

## 2015-02-07 DIAGNOSIS — M545 Low back pain: Secondary | ICD-10-CM | POA: Diagnosis not present

## 2015-02-07 DIAGNOSIS — I10 Essential (primary) hypertension: Secondary | ICD-10-CM | POA: Diagnosis not present

## 2015-02-10 ENCOUNTER — Other Ambulatory Visit: Payer: Self-pay | Admitting: Cardiology

## 2015-02-21 DIAGNOSIS — I10 Essential (primary) hypertension: Secondary | ICD-10-CM | POA: Diagnosis not present

## 2015-02-21 DIAGNOSIS — E8881 Metabolic syndrome: Secondary | ICD-10-CM | POA: Diagnosis not present

## 2015-02-22 DIAGNOSIS — L040 Acute lymphadenitis of face, head and neck: Secondary | ICD-10-CM | POA: Diagnosis not present

## 2015-02-22 DIAGNOSIS — R51 Headache: Secondary | ICD-10-CM | POA: Diagnosis not present

## 2015-03-08 DIAGNOSIS — I10 Essential (primary) hypertension: Secondary | ICD-10-CM | POA: Diagnosis not present

## 2015-03-08 DIAGNOSIS — E782 Mixed hyperlipidemia: Secondary | ICD-10-CM | POA: Diagnosis not present

## 2015-03-08 DIAGNOSIS — E119 Type 2 diabetes mellitus without complications: Secondary | ICD-10-CM | POA: Diagnosis not present

## 2015-03-08 DIAGNOSIS — E8881 Metabolic syndrome: Secondary | ICD-10-CM | POA: Diagnosis not present

## 2015-03-15 DIAGNOSIS — E119 Type 2 diabetes mellitus without complications: Secondary | ICD-10-CM | POA: Diagnosis not present

## 2015-03-15 DIAGNOSIS — F410 Panic disorder [episodic paroxysmal anxiety] without agoraphobia: Secondary | ICD-10-CM | POA: Diagnosis not present

## 2015-03-15 DIAGNOSIS — I10 Essential (primary) hypertension: Secondary | ICD-10-CM | POA: Diagnosis not present

## 2015-03-15 DIAGNOSIS — I2510 Atherosclerotic heart disease of native coronary artery without angina pectoris: Secondary | ICD-10-CM | POA: Diagnosis not present

## 2015-03-15 DIAGNOSIS — F17210 Nicotine dependence, cigarettes, uncomplicated: Secondary | ICD-10-CM | POA: Diagnosis not present

## 2015-03-15 DIAGNOSIS — F331 Major depressive disorder, recurrent, moderate: Secondary | ICD-10-CM | POA: Diagnosis not present

## 2015-03-15 DIAGNOSIS — G4733 Obstructive sleep apnea (adult) (pediatric): Secondary | ICD-10-CM | POA: Diagnosis not present

## 2015-03-15 DIAGNOSIS — J4530 Mild persistent asthma, uncomplicated: Secondary | ICD-10-CM | POA: Diagnosis not present

## 2015-03-15 DIAGNOSIS — E8881 Metabolic syndrome: Secondary | ICD-10-CM | POA: Diagnosis not present

## 2015-03-15 DIAGNOSIS — E782 Mixed hyperlipidemia: Secondary | ICD-10-CM | POA: Diagnosis not present

## 2015-03-15 DIAGNOSIS — Z23 Encounter for immunization: Secondary | ICD-10-CM | POA: Diagnosis not present

## 2015-03-16 ENCOUNTER — Other Ambulatory Visit: Payer: Self-pay | Admitting: *Deleted

## 2015-03-16 MED ORDER — CLOPIDOGREL BISULFATE 75 MG PO TABS: 75.0000 mg | ORAL_TABLET | Freq: Every day | ORAL | Status: DC

## 2015-04-12 ENCOUNTER — Encounter: Payer: Self-pay | Admitting: *Deleted

## 2015-04-18 ENCOUNTER — Encounter: Payer: Self-pay | Admitting: Cardiology

## 2015-04-18 ENCOUNTER — Ambulatory Visit (INDEPENDENT_AMBULATORY_CARE_PROVIDER_SITE_OTHER): Payer: Medicare Other | Admitting: Cardiology

## 2015-04-18 VITALS — BP 142/70 | HR 82 | Ht 71.0 in | Wt 262.0 lb

## 2015-04-18 DIAGNOSIS — I429 Cardiomyopathy, unspecified: Secondary | ICD-10-CM

## 2015-04-18 DIAGNOSIS — I10 Essential (primary) hypertension: Secondary | ICD-10-CM | POA: Diagnosis not present

## 2015-04-18 DIAGNOSIS — I2510 Atherosclerotic heart disease of native coronary artery without angina pectoris: Secondary | ICD-10-CM

## 2015-04-18 DIAGNOSIS — F17201 Nicotine dependence, unspecified, in remission: Secondary | ICD-10-CM

## 2015-04-18 LAB — EKG 12-LEAD

## 2015-04-18 NOTE — Progress Notes (Signed)
Cardiology Office Note  Date: 04/18/2015   ID: [name], DOB 1966/06/21, MRN [id number]  PCP: Gar Ponto, MD  Primary Cardiologist: Rozann Lesches, MD   Chief Complaint  Patient presents with  . Coronary Artery Disease  . Cardiomyopathy    History of Present Illness: [name] is a medically complex 49 y.o. male last seen in April. He is here today with his wife. He reports no angina symptoms. He volunteers at a local school, has not been involved in any regular exercise plan. We talked about a walking regimen which has been effective for his wife in terms of weight loss. He has lost some weight since the last encounter, around 15 pounds.   Most recent lab work from Dr. Quillian Quince is outlined below. We reviewed his medications. Blood pressure trend has been better.  Cardiac catheterization in April 2015 in the setting of medication noncompliance and NSTEMI demonstrated severe multivessel disease with patency of the LIMA to LAD, SVG to first diagonal, SVG to OM1 and OM 2, and SVG to PDA.  ECG today shows normal sinus rhythm with old anterior infarct pattern and nonspecific ST-T changes.  Past Medical History  Diagnosis Date  . Mixed hyperlipidemia   . Essential hypertension, benign   . COPD (chronic obstructive pulmonary disease) ([place])   . Depression   . Anaphylaxis     IGE mediated  . Myocardial infarction ([place]) 2010  . Anxiety   . Arthritis   . Type 2 diabetes mellitus ([place])   . OSA (obstructive sleep apnea)   . History of CVA (cerebrovascular accident) 01/2012    Right posterior frontal cortical and subcortical brain by MRI, no hemorrhage. Carotid Dopplers showed only 1-50% bilateral ICA stenoses. Echocardiogram showed LVEF 50%, no major valvular abnormalities.  . Coronary atherosclerosis of native coronary artery     Multivessel status post CABG 2010  . Cardiomyopathy [facility])     Past Surgical History  Procedure Laterality Date  . Gastric  bypass  2010  . Hernia repair  2011, 2012  . Toe amputation  1998    right 1st and 2nd toe  . Tonsilectomy, adenoidectomy, bilateral myringotomy and tubes    . Dental surgery  2003  . Cholecystectomy    . Coronary artery bypass graft  2010    LIMA to LAD, SVG to diagonal, SVG to OM1 and OM 2, SVG to RCA  . Tonsillectomy    . Ventral hernia repair N/A 10/28/2012    Procedure: LAPAROSCOPIC VENTRAL HERNIA;  Surgeon: Donato Heinz, MD;  Location: [facility];  Service: General;  Laterality: N/A;  . Incisional hernia repair N/A 07/23/2013    Procedure: HERNIA REPAIR INCISIONAL WITH MESH;  Surgeon: Jamesetta So, MD;  Location: [facility]S;  Service: General;  Laterality: N/A;  . Insertion of mesh N/A 07/23/2013    Procedure: INSERTION OF MESH;  Surgeon: Jamesetta So, MD;  Location: [facility];  Service: General;  Laterality: N/A;  . Left heart catheterization with coronary/graft angiogram N/A 11/01/2013    Procedure: LEFT HEART CATHETERIZATION WITH Beatrix Fetters;  Surgeon: Blane Ohara, MD;  Location: [facility] CATH LAB;  Service: Cardiovascular;  Laterality: N/A;    Current Outpatient Prescriptions  Medication Sig Dispense Refill  . acetaminophen (TYLENOL) 500 MG tablet Take 1,000 mg by mouth every 6 (six) hours as needed for mild pain, moderate pain or headache.     . albuterol (PROVENTIL HFA;VENTOLIN HFA) 108 (90 BASE) MCG/ACT inhaler Inhale 2  puffs into the lungs every 6 (six) hours as needed for wheezing or shortness of breath.    . ALPRAZolam (XANAX) 1 MG tablet Take 1 mg by mouth 4 (four) times daily as needed for anxiety.     [name] amLODipine (NORVASC) 10 MG tablet Take 10 mg by mouth daily.    [name] aspirin EC 325 MG tablet Take 325 mg by mouth daily.    . carvedilol (COREG) 25 MG tablet Take 1 tablet (25 mg total) by mouth 2 (two) times daily with a meal. 60 tablet 6  . chlorthalidone (HYGROTON) 25 MG tablet Take 25 mg by mouth daily.    . clopidogrel (PLAVIX) 75 MG tablet Take 1 tablet (75 mg  total) by mouth daily. 30 tablet 1  . docusate sodium (COLACE) 100 MG capsule Take 1 capsule (100 mg total) by mouth every 12 (twelve) hours. 60 capsule 0  . DULoxetine (CYMBALTA) 60 MG capsule Take 60 mg by mouth daily.    . ergocalciferol (VITAMIN D2) 50000 UNITS capsule Take 50,000 Units by mouth once a week.    . fluticasone (CUTIVATE) 0.05 % cream Apply 1 application topically 2 (two) times daily.    . isosorbide mononitrate (IMDUR) 60 MG 24 hr tablet Take 60 mg by mouth 2 (two) times daily.     [name] losartan (COZAAR) 100 MG tablet Take 1 tablet (100 mg total) by mouth daily. 90 tablet 3  . nicotine (NICODERM CQ - DOSED IN MG/24 HOURS) 21 mg/24hr patch Place 21 mg onto the skin daily.    . nitroGLYCERIN (NITROSTAT) 0.4 MG SL tablet Place 0.4 mg under the tongue every 5 (five) minutes as needed. For chest pain    . rosuvastatin (CRESTOR) 40 MG tablet Take 40 mg by mouth daily.    [name] spironolactone (ALDACTONE) 25 MG tablet Take 1 tablet (25 mg total) by mouth daily. 90 tablet 3  . traMADol (ULTRAM) 50 MG tablet Take 50 mg by mouth 4 (four) times daily. For pain     No current facility-administered medications for this visit.    Allergies:  Contrast media; Ibuprofen; and Nsaids   Social History: The patient  reports that he has been smoking Cigarettes.  He started smoking about 32 years ago. He has a 22.5 pack-year smoking history. He has never used smokeless tobacco. He reports that he drinks alcohol. He reports that he does not use illicit drugs.   ROS:  Please see the history of present illness. Otherwise, complete review of systems is positive for none.  All other systems are reviewed and negative.   Physical Exam: VS:  BP 142/70 mmHg  Pulse 82  Ht 5\' 11"  (1.803 m)  Wt 262 lb (118.842 kg)  BMI 36.56 kg/m2  SpO2 97%, BMI Body mass index is 36.56 kg/(m^2).  Wt Readings from Last 3 Encounters:  04/18/15 262 lb (118.842 kg)  10/21/14 279 lb (126.554 kg)  07/25/14 267 lb (121.11 kg)       General: Obese male, appears comfortable at rest. HEENT: Conjunctiva and lids normal, oropharynx clear with moist mucosa. Neck: Supple, no elevated JVP or carotid bruits, no thyromegaly. Lungs: Clear to auscultation, nonlabored breathing at rest. Cardiac: Regular rate and rhythm, no S3 or significant systolic murmur, no pericardial rub. Abdomen: Soft, nontender, no hepatomegaly, bowel sounds present, no guarding or rebound. Extremities: No pitting edema, distal pulses 2+. Skin: Warm and dry. Musculoskeletal: No kyphosis. Neuropsychiatric: Alert and oriented x3, affect grossly appropriate.   ECG: ECG is  ordered today.   Recent Labwork:  August 2016: Cholesterol 195, triglycerides 191, HDL 29, LDL 128, BUN 19, creatinine 1.0, potassium 4.3, AST 17, ALT 26   Other Studies Reviewed Today:  Echocardiogram 01/20/2014: Study Conclusions  - Procedure narrative: Transthoracic echocardiography. Image quality was poor. The study was technically difficult, as a result of poor sound wave transmission and body habitus. Intravenous contrast (Definity) was administered. - Left ventricle: The cavity size was mildly dilated. Systolic function was low normal to mildly reduced. The estimated ejection fraction was in the range of 45% to 50%. Features are consistent with a pseudonormal left ventricular filling pattern, with concomitant abnormal relaxation and increased filling pressure (grade 2 diastolic dysfunction). Mildly elevated filling pressures. Mild posterior wall thickening. - Regional wall motion abnormality: Moderate hypokinesis of the mid-apical anterior, mid anteroseptal, mid anterolateral, apical septal, apical lateral, and apical myocardium. - Aortic valve: There was mild regurgitation. - Left atrium: The atrium was mildly dilated.  Assessment and Plan:  1. Symptomatically stable CAD status post CABG with patent bypass grafts documented at angiography last  April. Continue current medical therapy. I have recommended a walking regimen for exercise and weight loss.  2. Essential hypertension, blood pressure control has been better with recent medication adjustments per Dr. Quillian Quince. No changes were made today.  3. Ischemic cardiomyopathy with LVEF 45-50%. No active heart failure symptoms.  4. Tobacco abuse in remission.  Current medicines were reviewed with the patient today.   Orders Placed This Encounter  Procedures  . EKG 12-Lead    Disposition: FU with me in 4 months.   Signed, Satira Sark, MD, [facility] 04/18/2015 10:18 AM    [address], [place], [place] [address] Phone: [phone]; Fax: [phone]

## 2015-04-18 NOTE — Patient Instructions (Signed)
Your physician recommends that you continue on your current medications as directed. Please refer to the Current Medication list given to you today. Your physician recommends that you schedule a follow-up appointment in: 4 months. You will receive a reminder letter in the mail in about 4 months reminding you to call and schedule your appointment. If you don't receive this letter, please contact our office.

## 2015-04-19 ENCOUNTER — Encounter (HOSPITAL_COMMUNITY): Payer: Self-pay | Admitting: Emergency Medicine

## 2015-04-19 ENCOUNTER — Emergency Department (HOSPITAL_COMMUNITY)
Admission: EM | Admit: 2015-04-19 | Discharge: 2015-04-19 | Disposition: A | Discharge status: Home / Self Care | Payer: Medicare Other | Attending: Emergency Medicine | Admitting: Emergency Medicine

## 2015-04-19 DIAGNOSIS — I252 Old myocardial infarction: Secondary | ICD-10-CM | POA: Diagnosis not present

## 2015-04-19 DIAGNOSIS — Z9889 Other specified postprocedural states: Secondary | ICD-10-CM | POA: Insufficient documentation

## 2015-04-19 DIAGNOSIS — Z8669 Personal history of other diseases of the nervous system and sense organs: Secondary | ICD-10-CM | POA: Insufficient documentation

## 2015-04-19 DIAGNOSIS — Z7902 Long term (current) use of antithrombotics/antiplatelets: Secondary | ICD-10-CM | POA: Diagnosis not present

## 2015-04-19 DIAGNOSIS — M546 Pain in thoracic spine: Secondary | ICD-10-CM

## 2015-04-19 DIAGNOSIS — Z951 Presence of aortocoronary bypass graft: Secondary | ICD-10-CM | POA: Diagnosis not present

## 2015-04-19 DIAGNOSIS — I10 Essential (primary) hypertension: Secondary | ICD-10-CM | POA: Insufficient documentation

## 2015-04-19 DIAGNOSIS — F419 Anxiety disorder, unspecified: Secondary | ICD-10-CM | POA: Diagnosis not present

## 2015-04-19 DIAGNOSIS — E782 Mixed hyperlipidemia: Secondary | ICD-10-CM | POA: Diagnosis not present

## 2015-04-19 DIAGNOSIS — Z7982 Long term (current) use of aspirin: Secondary | ICD-10-CM | POA: Insufficient documentation

## 2015-04-19 DIAGNOSIS — Z79899 Other long term (current) drug therapy: Secondary | ICD-10-CM | POA: Diagnosis not present

## 2015-04-19 DIAGNOSIS — J449 Chronic obstructive pulmonary disease, unspecified: Secondary | ICD-10-CM | POA: Insufficient documentation

## 2015-04-19 DIAGNOSIS — F329 Major depressive disorder, single episode, unspecified: Secondary | ICD-10-CM | POA: Diagnosis not present

## 2015-04-19 DIAGNOSIS — Z791 Long term (current) use of non-steroidal anti-inflammatories (NSAID): Secondary | ICD-10-CM | POA: Diagnosis not present

## 2015-04-19 DIAGNOSIS — Z720 Tobacco use: Secondary | ICD-10-CM | POA: Insufficient documentation

## 2015-04-19 DIAGNOSIS — M1990 Unspecified osteoarthritis, unspecified site: Secondary | ICD-10-CM | POA: Diagnosis not present

## 2015-04-19 DIAGNOSIS — Z8673 Personal history of transient ischemic attack (TIA), and cerebral infarction without residual deficits: Secondary | ICD-10-CM | POA: Insufficient documentation

## 2015-04-19 DIAGNOSIS — Z7951 Long term (current) use of inhaled steroids: Secondary | ICD-10-CM | POA: Insufficient documentation

## 2015-04-19 DIAGNOSIS — E119 Type 2 diabetes mellitus without complications: Secondary | ICD-10-CM | POA: Insufficient documentation

## 2015-04-19 DIAGNOSIS — I2510 Atherosclerotic heart disease of native coronary artery without angina pectoris: Secondary | ICD-10-CM | POA: Diagnosis not present

## 2015-04-19 MED ORDER — OXYCODONE-ACETAMINOPHEN 5-325 MG PO TABS
1.0000 | ORAL_TABLET | Freq: Once | ORAL | Status: AC
Start: 2015-04-19 — End: 2015-04-19
  Administered 2015-04-19: 1 via ORAL
  Filled 2015-04-19: qty 1

## 2015-04-19 NOTE — ED Notes (Signed)
Pt had a fall in June and injured  Back, on medication, no relieft

## 2015-04-19 NOTE — ED Provider Notes (Signed)
CSN: [id number]     Arrival date & time 04/19/15  2116 History   First MD Initiated Contact with Patient 04/19/15 2146     Chief Complaint  Patient presents with  . Back Pain     (Consider location/radiation/quality/duration/timing/severity/associated sxs/prior Treatment) Patient is a 49 y.o. male presenting with back pain. The history is provided by the patient.  Back Pain Location:  Thoracic spine Quality:  Aching and shooting Radiates to:  Does not radiate Pain severity:  Severe Pain is:  Same all the time Onset quality:  Sudden Duration:  4 months Timing:  Constant Relieved by:  Nothing Worsened by:  Ambulation and movement Ineffective treatments:  Muscle relaxants and ibuprofen Associated symptoms: no bladder incontinence and no bowel incontinence    ANDREWS DEVAN is a 49 y.o. male with multiple medical problems as listed below presents to the ED with thoracic back pain that has been constant since he injured it in June. Patient states that he was helping someone move and standing on the back of a truck and did not realize how close to the edge he was and fell. He went to [facility] ED at that time and had x-rays and told no fracture. He followed up with his PCP and had another x-ray and patient has been taking tramadol and tylenol. Patient is allergic to NSAIDS. He states that the tramadol that he has for neuropathy and it is not touching the pain. He denies re injruy of the back. He denies UTI symptoms or other problems tonight. He is here for pain management. Patient had a visit with his cardiologist yesterday and discussed the ongoing pain and was told he needs to address it with his PCP.   Past Medical History  Diagnosis Date  . Mixed hyperlipidemia   . Essential hypertension, benign   . COPD (chronic obstructive pulmonary disease) ([place])   . Depression   . Anaphylaxis     IGE mediated  . Myocardial infarction ([place]) 2010  . Anxiety   . Arthritis   . Type 2  diabetes mellitus ([place])   . OSA (obstructive sleep apnea)   . History of CVA (cerebrovascular accident) 01/2012    Right posterior frontal cortical and subcortical brain by MRI, no hemorrhage. Carotid Dopplers showed only 1-50% bilateral ICA stenoses. Echocardiogram showed LVEF 50%, no major valvular abnormalities.  . Coronary atherosclerosis of native coronary artery     Multivessel status post CABG 2010  . Cardiomyopathy [facility])    Past Surgical History  Procedure Laterality Date  . Gastric bypass  2010  . Hernia repair  2011, 2012  . Toe amputation  1998    right 1st and 2nd toe  . Tonsilectomy, adenoidectomy, bilateral myringotomy and tubes    . Dental surgery  2003  . Cholecystectomy    . Coronary artery bypass graft  2010    LIMA to LAD, SVG to diagonal, SVG to OM1 and OM 2, SVG to RCA  . Tonsillectomy    . Ventral hernia repair N/A 10/28/2012    Procedure: LAPAROSCOPIC VENTRAL HERNIA;  Surgeon: Donato Heinz, MD;  Location: [facility];  Service: General;  Laterality: N/A;  . Incisional hernia repair N/A 07/23/2013    Procedure: HERNIA REPAIR INCISIONAL WITH MESH;  Surgeon: Jamesetta So, MD;  Location: [facility]S;  Service: General;  Laterality: N/A;  . Insertion of mesh N/A 07/23/2013    Procedure: INSERTION OF MESH;  Surgeon: Jamesetta So, MD;  Location: [facility];  Service: General;  Laterality: N/A;  . Left heart catheterization with coronary/graft angiogram N/A 11/01/2013    Procedure: LEFT HEART CATHETERIZATION WITH Beatrix Fetters;  Surgeon: Blane Ohara, MD;  Location: [facility] CATH LAB;  Service: Cardiovascular;  Laterality: N/A;   Family History  Problem Relation Age of Onset  . Breast cancer Maternal Aunt   . Lung cancer Father    Social History  Substance Use Topics  . Smoking status: Current Some Day Smoker -- 0.75 packs/day for 30 years    Types: Cigarettes    Start date: 06/08/1982    Last Attempt to Quit: 09/28/2014  . Smokeless tobacco: Never Used     Comment:  quit 3 weeks ago  . Alcohol Use: 0.0 oz/week    0 Standard drinks or equivalent per week     Comment: rarely    Review of Systems  Gastrointestinal: Negative for bowel incontinence.  Genitourinary: Negative for bladder incontinence.  Musculoskeletal: Positive for back pain.  all other systems negative except as stated in HPI    Allergies  Contrast media; Ibuprofen; and Nsaids  Home Medications   Prior to Admission medications   Medication Sig Start Date End Date Taking? Authorizing Provider  acetaminophen (TYLENOL) 500 MG tablet Take 1,000 mg by mouth every 6 (six) hours as needed for mild pain, moderate pain or headache.     Historical Provider, MD  albuterol (PROVENTIL HFA;VENTOLIN HFA) 108 (90 BASE) MCG/ACT inhaler Inhale 2 puffs into the lungs every 6 (six) hours as needed for wheezing or shortness of breath.    Historical Provider, MD  ALPRAZolam Duanne Moron) 1 MG tablet Take 1 mg by mouth 4 (four) times daily as needed for anxiety.     Historical Provider, MD  amLODipine (NORVASC) 10 MG tablet Take 10 mg by mouth daily.    Historical Provider, MD  aspirin EC 325 MG tablet Take 325 mg by mouth daily.    Historical Provider, MD  carvedilol (COREG) 25 MG tablet Take 1 tablet (25 mg total) by mouth 2 (two) times daily with a meal. 11/24/13   Lendon Colonel, NP  chlorthalidone (HYGROTON) 25 MG tablet Take 25 mg by mouth daily.    Historical Provider, MD  clopidogrel (PLAVIX) 75 MG tablet Take 1 tablet (75 mg total) by mouth daily. 03/16/15   Satira Sark, MD  docusate sodium (COLACE) 100 MG capsule Take 1 capsule (100 mg total) by mouth every 12 (twelve) hours. 07/25/14   Kristen N Ward, DO  DULoxetine (CYMBALTA) 60 MG capsule Take 60 mg by mouth daily.    Historical Provider, MD  ergocalciferol (VITAMIN D2) 50000 UNITS capsule Take 50,000 Units by mouth once a week.    Historical Provider, MD  fluticasone (CUTIVATE) 0.05 % cream Apply 1 application topically 2 (two) times daily.     Historical Provider, MD  isosorbide mononitrate (IMDUR) 60 MG 24 hr tablet Take 60 mg by mouth 2 (two) times daily.     Historical Provider, MD  losartan (COZAAR) 100 MG tablet Take 1 tablet (100 mg total) by mouth daily. 05/04/14   Satira Sark, MD  nicotine (NICODERM CQ - DOSED IN MG/24 HOURS) 21 mg/24hr patch Place 21 mg onto the skin daily.    Historical Provider, MD  nitroGLYCERIN (NITROSTAT) 0.4 MG SL tablet Place 0.4 mg under the tongue every 5 (five) minutes as needed. For chest pain    Historical Provider, MD  rosuvastatin (CRESTOR) 40 MG tablet Take 40 mg by mouth  daily.    Historical Provider, MD  spironolactone (ALDACTONE) 25 MG tablet Take 1 tablet (25 mg total) by mouth daily. 05/04/14   Satira Sark, MD  traMADol (ULTRAM) 50 MG tablet Take 50 mg by mouth 4 (four) times daily. For pain    Historical Provider, MD   BP 161/73 mmHg  Pulse 88  Temp(Src) 98.5 F (36.9 C) (Oral)  Resp 18  Ht 5\' 11"  (1.803 m)  Wt 262 lb (118.842 kg)  BMI 36.56 kg/m2  SpO2 100% Physical Exam  Constitutional: He is oriented to person, place, and time. He appears well-developed and well-nourished. No distress.  HENT:  Head: Normocephalic and atraumatic.  Eyes: EOM are normal. Pupils are equal, round, and reactive to light.  Neck: Normal range of motion. Neck supple.  Cardiovascular: Normal rate and regular rhythm.   Pulmonary/Chest: Effort normal and breath sounds normal. No respiratory distress.  Abdominal: Soft. Bowel sounds are normal. There is no tenderness.  Musculoskeletal: Normal range of motion. He exhibits no edema.       Thoracic back: He exhibits tenderness and spasm. He exhibits normal pulse. Decreased range of motion: due to pain.  Radial pulses 2+, adequate circulation, equal grips. Pedal pulses 2+, adequate circulation, missing great toe and second toe right due to lawn mower accident several years ago.  Tender with palpation of the thoracic spine.  Pain with straight leg  raises.   Neurological: He is alert and oriented to person, place, and time. He has normal strength. No cranial nerve deficit or sensory deficit. Gait normal.  Reflex Scores:      Bicep reflexes are 2+ on the right side and 2+ on the left side.      Brachioradialis reflexes are 2+ on the right side and 2+ on the left side.      Patellar reflexes are 2+ on the right side and 2+ on the left side.      Achilles reflexes are 2+ on the right side and 2+ on the left side. Skin: Skin is warm and dry.  Psychiatric: He has a normal mood and affect. His behavior is normal.  Nursing note and vitals reviewed.   ED Course  Procedures Percocet 5/325 PO here tonight  MDM  49 y.o. male with hx of fall 4 months ago causing thoracic back pain that has been ongoing since the injury. Stable for d/c without focal neuro deficits. Discussed with the patient that he will need to see his PCP for continued pain management. Patient agrees with plan.   Final diagnoses:  Bilateral thoracic back pain        Ashley Murrain, NP 04/19/15 [place], MD 04/19/15 [phone]

## 2015-04-19 NOTE — Discharge Instructions (Signed)
Continue to take your tramadol and muscle relaxant and call your primary care doctor in the morning to discuss pain management.

## 2015-04-20 DIAGNOSIS — M546 Pain in thoracic spine: Secondary | ICD-10-CM | POA: Diagnosis not present

## 2015-05-04 DIAGNOSIS — M546 Pain in thoracic spine: Secondary | ICD-10-CM | POA: Diagnosis not present

## 2015-05-11 DIAGNOSIS — M47814 Spondylosis without myelopathy or radiculopathy, thoracic region: Secondary | ICD-10-CM | POA: Diagnosis not present

## 2015-05-11 DIAGNOSIS — M546 Pain in thoracic spine: Secondary | ICD-10-CM | POA: Diagnosis not present

## 2015-05-11 DIAGNOSIS — M4804 Spinal stenosis, thoracic region: Secondary | ICD-10-CM | POA: Diagnosis not present

## 2015-05-12 DIAGNOSIS — K219 Gastro-esophageal reflux disease without esophagitis: Secondary | ICD-10-CM | POA: Diagnosis not present

## 2015-05-12 DIAGNOSIS — E119 Type 2 diabetes mellitus without complications: Secondary | ICD-10-CM | POA: Diagnosis not present

## 2015-05-12 DIAGNOSIS — E782 Mixed hyperlipidemia: Secondary | ICD-10-CM | POA: Diagnosis not present

## 2015-05-12 DIAGNOSIS — F331 Major depressive disorder, recurrent, moderate: Secondary | ICD-10-CM | POA: Diagnosis not present

## 2015-05-12 DIAGNOSIS — E8881 Metabolic syndrome: Secondary | ICD-10-CM | POA: Diagnosis not present

## 2015-05-12 DIAGNOSIS — F17210 Nicotine dependence, cigarettes, uncomplicated: Secondary | ICD-10-CM | POA: Diagnosis not present

## 2015-05-12 DIAGNOSIS — G4733 Obstructive sleep apnea (adult) (pediatric): Secondary | ICD-10-CM | POA: Diagnosis not present

## 2015-05-12 DIAGNOSIS — F410 Panic disorder [episodic paroxysmal anxiety] without agoraphobia: Secondary | ICD-10-CM | POA: Diagnosis not present

## 2015-05-12 DIAGNOSIS — I10 Essential (primary) hypertension: Secondary | ICD-10-CM | POA: Diagnosis not present

## 2015-05-12 DIAGNOSIS — I25111 Atherosclerotic heart disease of native coronary artery with angina pectoris with documented spasm: Secondary | ICD-10-CM | POA: Diagnosis not present

## 2015-05-12 DIAGNOSIS — L723 Sebaceous cyst: Secondary | ICD-10-CM | POA: Diagnosis not present

## 2015-05-17 ENCOUNTER — Other Ambulatory Visit: Payer: Self-pay | Admitting: Cardiology

## 2015-05-31 DIAGNOSIS — L719 Rosacea, unspecified: Secondary | ICD-10-CM | POA: Diagnosis not present

## 2015-05-31 DIAGNOSIS — G4733 Obstructive sleep apnea (adult) (pediatric): Secondary | ICD-10-CM | POA: Diagnosis not present

## 2015-05-31 DIAGNOSIS — F410 Panic disorder [episodic paroxysmal anxiety] without agoraphobia: Secondary | ICD-10-CM | POA: Diagnosis not present

## 2015-05-31 DIAGNOSIS — F331 Major depressive disorder, recurrent, moderate: Secondary | ICD-10-CM | POA: Diagnosis not present

## 2015-05-31 DIAGNOSIS — E8881 Metabolic syndrome: Secondary | ICD-10-CM | POA: Diagnosis not present

## 2015-05-31 DIAGNOSIS — I10 Essential (primary) hypertension: Secondary | ICD-10-CM | POA: Diagnosis not present

## 2015-05-31 DIAGNOSIS — F17210 Nicotine dependence, cigarettes, uncomplicated: Secondary | ICD-10-CM | POA: Diagnosis not present

## 2015-05-31 DIAGNOSIS — E782 Mixed hyperlipidemia: Secondary | ICD-10-CM | POA: Diagnosis not present

## 2015-07-31 DIAGNOSIS — S29019A Strain of muscle and tendon of unspecified wall of thorax, initial encounter: Secondary | ICD-10-CM | POA: Diagnosis not present

## 2015-07-31 DIAGNOSIS — M47814 Spondylosis without myelopathy or radiculopathy, thoracic region: Secondary | ICD-10-CM | POA: Diagnosis not present

## 2015-08-08 DIAGNOSIS — K219 Gastro-esophageal reflux disease without esophagitis: Secondary | ICD-10-CM | POA: Diagnosis not present

## 2015-08-08 DIAGNOSIS — I10 Essential (primary) hypertension: Secondary | ICD-10-CM | POA: Diagnosis not present

## 2015-08-08 DIAGNOSIS — E782 Mixed hyperlipidemia: Secondary | ICD-10-CM | POA: Diagnosis not present

## 2015-08-08 DIAGNOSIS — E119 Type 2 diabetes mellitus without complications: Secondary | ICD-10-CM | POA: Diagnosis not present

## 2015-08-09 DIAGNOSIS — M47814 Spondylosis without myelopathy or radiculopathy, thoracic region: Secondary | ICD-10-CM | POA: Diagnosis not present

## 2015-08-09 DIAGNOSIS — M545 Low back pain: Secondary | ICD-10-CM | POA: Diagnosis not present

## 2015-08-14 DIAGNOSIS — M545 Low back pain: Secondary | ICD-10-CM | POA: Diagnosis not present

## 2015-08-14 DIAGNOSIS — M47814 Spondylosis without myelopathy or radiculopathy, thoracic region: Secondary | ICD-10-CM | POA: Diagnosis not present

## 2015-08-15 DIAGNOSIS — E6609 Other obesity due to excess calories: Secondary | ICD-10-CM | POA: Diagnosis not present

## 2015-08-15 DIAGNOSIS — E782 Mixed hyperlipidemia: Secondary | ICD-10-CM | POA: Diagnosis not present

## 2015-08-15 DIAGNOSIS — G4733 Obstructive sleep apnea (adult) (pediatric): Secondary | ICD-10-CM | POA: Diagnosis not present

## 2015-08-15 DIAGNOSIS — J4530 Mild persistent asthma, uncomplicated: Secondary | ICD-10-CM | POA: Diagnosis not present

## 2015-08-15 DIAGNOSIS — F331 Major depressive disorder, recurrent, moderate: Secondary | ICD-10-CM | POA: Diagnosis not present

## 2015-08-15 DIAGNOSIS — F17210 Nicotine dependence, cigarettes, uncomplicated: Secondary | ICD-10-CM | POA: Diagnosis not present

## 2015-08-15 DIAGNOSIS — I2510 Atherosclerotic heart disease of native coronary artery without angina pectoris: Secondary | ICD-10-CM | POA: Diagnosis not present

## 2015-08-15 DIAGNOSIS — E119 Type 2 diabetes mellitus without complications: Secondary | ICD-10-CM | POA: Diagnosis not present

## 2015-08-15 DIAGNOSIS — I10 Essential (primary) hypertension: Secondary | ICD-10-CM | POA: Diagnosis not present

## 2015-08-15 DIAGNOSIS — E8881 Metabolic syndrome: Secondary | ICD-10-CM | POA: Diagnosis not present

## 2015-08-15 DIAGNOSIS — Z1389 Encounter for screening for other disorder: Secondary | ICD-10-CM | POA: Diagnosis not present

## 2015-08-15 DIAGNOSIS — G6289 Other specified polyneuropathies: Secondary | ICD-10-CM | POA: Diagnosis not present

## 2015-08-16 DIAGNOSIS — M47814 Spondylosis without myelopathy or radiculopathy, thoracic region: Secondary | ICD-10-CM | POA: Diagnosis not present

## 2015-08-16 DIAGNOSIS — M545 Low back pain: Secondary | ICD-10-CM | POA: Diagnosis not present

## 2015-08-21 DIAGNOSIS — M545 Low back pain: Secondary | ICD-10-CM | POA: Diagnosis not present

## 2015-08-21 DIAGNOSIS — M47814 Spondylosis without myelopathy or radiculopathy, thoracic region: Secondary | ICD-10-CM | POA: Diagnosis not present

## 2015-08-28 DIAGNOSIS — M545 Low back pain: Secondary | ICD-10-CM | POA: Diagnosis not present

## 2015-08-28 DIAGNOSIS — M47814 Spondylosis without myelopathy or radiculopathy, thoracic region: Secondary | ICD-10-CM | POA: Diagnosis not present

## 2015-09-04 DIAGNOSIS — M545 Low back pain: Secondary | ICD-10-CM | POA: Diagnosis not present

## 2015-09-04 DIAGNOSIS — M47814 Spondylosis without myelopathy or radiculopathy, thoracic region: Secondary | ICD-10-CM | POA: Diagnosis not present

## 2015-10-13 DIAGNOSIS — I10 Essential (primary) hypertension: Secondary | ICD-10-CM | POA: Diagnosis not present

## 2015-10-13 DIAGNOSIS — M79674 Pain in right toe(s): Secondary | ICD-10-CM | POA: Diagnosis not present

## 2015-10-25 DIAGNOSIS — Z886 Allergy status to analgesic agent status: Secondary | ICD-10-CM | POA: Diagnosis not present

## 2015-10-25 DIAGNOSIS — F17200 Nicotine dependence, unspecified, uncomplicated: Secondary | ICD-10-CM | POA: Diagnosis not present

## 2015-10-25 DIAGNOSIS — Z7902 Long term (current) use of antithrombotics/antiplatelets: Secondary | ICD-10-CM | POA: Diagnosis not present

## 2015-10-25 DIAGNOSIS — L02416 Cutaneous abscess of left lower limb: Secondary | ICD-10-CM | POA: Diagnosis not present

## 2015-10-25 DIAGNOSIS — Z79899 Other long term (current) drug therapy: Secondary | ICD-10-CM | POA: Diagnosis not present

## 2015-10-25 DIAGNOSIS — F329 Major depressive disorder, single episode, unspecified: Secondary | ICD-10-CM | POA: Diagnosis not present

## 2015-10-25 DIAGNOSIS — Z951 Presence of aortocoronary bypass graft: Secondary | ICD-10-CM | POA: Diagnosis not present

## 2015-10-25 DIAGNOSIS — E119 Type 2 diabetes mellitus without complications: Secondary | ICD-10-CM | POA: Diagnosis not present

## 2015-10-25 DIAGNOSIS — Z7982 Long term (current) use of aspirin: Secondary | ICD-10-CM | POA: Diagnosis not present

## 2015-10-25 DIAGNOSIS — I2510 Atherosclerotic heart disease of native coronary artery without angina pectoris: Secondary | ICD-10-CM | POA: Diagnosis not present

## 2015-10-25 DIAGNOSIS — Z91041 Radiographic dye allergy status: Secondary | ICD-10-CM | POA: Diagnosis not present

## 2015-10-25 DIAGNOSIS — I10 Essential (primary) hypertension: Secondary | ICD-10-CM | POA: Diagnosis not present

## 2015-11-07 DIAGNOSIS — E782 Mixed hyperlipidemia: Secondary | ICD-10-CM | POA: Diagnosis not present

## 2015-11-07 DIAGNOSIS — I10 Essential (primary) hypertension: Secondary | ICD-10-CM | POA: Diagnosis not present

## 2015-11-07 DIAGNOSIS — K219 Gastro-esophageal reflux disease without esophagitis: Secondary | ICD-10-CM | POA: Diagnosis not present

## 2015-11-07 DIAGNOSIS — E119 Type 2 diabetes mellitus without complications: Secondary | ICD-10-CM | POA: Diagnosis not present

## 2015-11-13 DIAGNOSIS — G4733 Obstructive sleep apnea (adult) (pediatric): Secondary | ICD-10-CM | POA: Diagnosis not present

## 2015-11-13 DIAGNOSIS — E782 Mixed hyperlipidemia: Secondary | ICD-10-CM | POA: Diagnosis not present

## 2015-11-13 DIAGNOSIS — F331 Major depressive disorder, recurrent, moderate: Secondary | ICD-10-CM | POA: Diagnosis not present

## 2015-11-13 DIAGNOSIS — E8881 Metabolic syndrome: Secondary | ICD-10-CM | POA: Diagnosis not present

## 2015-11-13 DIAGNOSIS — F17210 Nicotine dependence, cigarettes, uncomplicated: Secondary | ICD-10-CM | POA: Diagnosis not present

## 2015-11-13 DIAGNOSIS — E119 Type 2 diabetes mellitus without complications: Secondary | ICD-10-CM | POA: Diagnosis not present

## 2015-11-13 DIAGNOSIS — G6289 Other specified polyneuropathies: Secondary | ICD-10-CM | POA: Diagnosis not present

## 2015-11-13 DIAGNOSIS — E6609 Other obesity due to excess calories: Secondary | ICD-10-CM | POA: Diagnosis not present

## 2015-11-23 DIAGNOSIS — G5761 Lesion of plantar nerve, right lower limb: Secondary | ICD-10-CM | POA: Diagnosis not present

## 2015-11-23 DIAGNOSIS — M79674 Pain in right toe(s): Secondary | ICD-10-CM | POA: Diagnosis not present

## 2015-12-21 DIAGNOSIS — M79671 Pain in right foot: Secondary | ICD-10-CM | POA: Diagnosis not present

## 2015-12-21 DIAGNOSIS — G5761 Lesion of plantar nerve, right lower limb: Secondary | ICD-10-CM | POA: Diagnosis not present

## 2016-01-31 DIAGNOSIS — L03115 Cellulitis of right lower limb: Secondary | ICD-10-CM | POA: Diagnosis not present

## 2016-02-01 DIAGNOSIS — B9561 Methicillin susceptible Staphylococcus aureus infection as the cause of diseases classified elsewhere: Secondary | ICD-10-CM | POA: Diagnosis not present

## 2016-02-01 DIAGNOSIS — I2510 Atherosclerotic heart disease of native coronary artery without angina pectoris: Secondary | ICD-10-CM | POA: Diagnosis not present

## 2016-02-01 DIAGNOSIS — J45909 Unspecified asthma, uncomplicated: Secondary | ICD-10-CM | POA: Diagnosis not present

## 2016-02-01 DIAGNOSIS — Z720 Tobacco use: Secondary | ICD-10-CM | POA: Diagnosis not present

## 2016-02-01 DIAGNOSIS — I10 Essential (primary) hypertension: Secondary | ICD-10-CM | POA: Diagnosis not present

## 2016-02-01 DIAGNOSIS — L03115 Cellulitis of right lower limb: Secondary | ICD-10-CM | POA: Diagnosis not present

## 2016-02-01 DIAGNOSIS — M868X7 Other osteomyelitis, ankle and foot: Secondary | ICD-10-CM | POA: Diagnosis not present

## 2016-02-01 DIAGNOSIS — M7989 Other specified soft tissue disorders: Secondary | ICD-10-CM | POA: Diagnosis not present

## 2016-02-01 DIAGNOSIS — F17210 Nicotine dependence, cigarettes, uncomplicated: Secondary | ICD-10-CM | POA: Diagnosis not present

## 2016-02-02 DIAGNOSIS — Z6836 Body mass index (BMI) 36.0-36.9, adult: Secondary | ICD-10-CM | POA: Diagnosis not present

## 2016-02-02 DIAGNOSIS — E119 Type 2 diabetes mellitus without complications: Secondary | ICD-10-CM | POA: Diagnosis present

## 2016-02-02 DIAGNOSIS — E785 Hyperlipidemia, unspecified: Secondary | ICD-10-CM | POA: Diagnosis present

## 2016-02-02 DIAGNOSIS — R600 Localized edema: Secondary | ICD-10-CM | POA: Diagnosis not present

## 2016-02-02 DIAGNOSIS — L03115 Cellulitis of right lower limb: Secondary | ICD-10-CM | POA: Diagnosis present

## 2016-02-02 DIAGNOSIS — Z79899 Other long term (current) drug therapy: Secondary | ICD-10-CM | POA: Diagnosis not present

## 2016-02-02 DIAGNOSIS — Z888 Allergy status to other drugs, medicaments and biological substances status: Secondary | ICD-10-CM | POA: Diagnosis not present

## 2016-02-02 DIAGNOSIS — F329 Major depressive disorder, single episode, unspecified: Secondary | ICD-10-CM | POA: Diagnosis present

## 2016-02-02 DIAGNOSIS — Z951 Presence of aortocoronary bypass graft: Secondary | ICD-10-CM | POA: Diagnosis not present

## 2016-02-02 DIAGNOSIS — Z801 Family history of malignant neoplasm of trachea, bronchus and lung: Secondary | ICD-10-CM | POA: Diagnosis not present

## 2016-02-02 DIAGNOSIS — Z886 Allergy status to analgesic agent status: Secondary | ICD-10-CM | POA: Diagnosis not present

## 2016-02-02 DIAGNOSIS — F17210 Nicotine dependence, cigarettes, uncomplicated: Secondary | ICD-10-CM | POA: Diagnosis present

## 2016-02-02 DIAGNOSIS — I10 Essential (primary) hypertension: Secondary | ICD-10-CM | POA: Diagnosis present

## 2016-02-02 DIAGNOSIS — B9561 Methicillin susceptible Staphylococcus aureus infection as the cause of diseases classified elsewhere: Secondary | ICD-10-CM | POA: Diagnosis present

## 2016-02-02 DIAGNOSIS — Z91041 Radiographic dye allergy status: Secondary | ICD-10-CM | POA: Diagnosis not present

## 2016-02-02 DIAGNOSIS — L03119 Cellulitis of unspecified part of limb: Secondary | ICD-10-CM | POA: Diagnosis not present

## 2016-02-02 DIAGNOSIS — Z7902 Long term (current) use of antithrombotics/antiplatelets: Secondary | ICD-10-CM | POA: Diagnosis not present

## 2016-02-02 DIAGNOSIS — Z8249 Family history of ischemic heart disease and other diseases of the circulatory system: Secondary | ICD-10-CM | POA: Diagnosis not present

## 2016-02-02 DIAGNOSIS — Z7982 Long term (current) use of aspirin: Secondary | ICD-10-CM | POA: Diagnosis not present

## 2016-02-02 DIAGNOSIS — J45909 Unspecified asthma, uncomplicated: Secondary | ICD-10-CM | POA: Diagnosis present

## 2016-02-02 DIAGNOSIS — I2510 Atherosclerotic heart disease of native coronary artery without angina pectoris: Secondary | ICD-10-CM | POA: Diagnosis present

## 2016-02-02 DIAGNOSIS — F419 Anxiety disorder, unspecified: Secondary | ICD-10-CM | POA: Diagnosis present

## 2016-02-02 DIAGNOSIS — E1169 Type 2 diabetes mellitus with other specified complication: Secondary | ICD-10-CM | POA: Diagnosis not present

## 2016-02-02 DIAGNOSIS — Z89421 Acquired absence of other right toe(s): Secondary | ICD-10-CM | POA: Diagnosis not present

## 2016-02-08 ENCOUNTER — Other Ambulatory Visit: Payer: Self-pay | Admitting: *Deleted

## 2016-02-08 MED ORDER — LOSARTAN POTASSIUM 100 MG PO TABS: ORAL_TABLET | ORAL | 0 refills | Status: DC

## 2016-02-14 DIAGNOSIS — L03115 Cellulitis of right lower limb: Secondary | ICD-10-CM | POA: Diagnosis not present

## 2016-02-14 DIAGNOSIS — E782 Mixed hyperlipidemia: Secondary | ICD-10-CM | POA: Diagnosis not present

## 2016-02-14 DIAGNOSIS — E1165 Type 2 diabetes mellitus with hyperglycemia: Secondary | ICD-10-CM | POA: Diagnosis not present

## 2016-02-14 DIAGNOSIS — Z6836 Body mass index (BMI) 36.0-36.9, adult: Secondary | ICD-10-CM | POA: Diagnosis not present

## 2016-02-15 DIAGNOSIS — M79671 Pain in right foot: Secondary | ICD-10-CM | POA: Diagnosis not present

## 2016-02-15 DIAGNOSIS — G5761 Lesion of plantar nerve, right lower limb: Secondary | ICD-10-CM | POA: Diagnosis not present

## 2016-02-19 DIAGNOSIS — G4733 Obstructive sleep apnea (adult) (pediatric): Secondary | ICD-10-CM | POA: Diagnosis not present

## 2016-02-19 DIAGNOSIS — Z6837 Body mass index (BMI) 37.0-37.9, adult: Secondary | ICD-10-CM | POA: Diagnosis not present

## 2016-02-19 DIAGNOSIS — E8881 Metabolic syndrome: Secondary | ICD-10-CM | POA: Diagnosis not present

## 2016-02-19 DIAGNOSIS — F17210 Nicotine dependence, cigarettes, uncomplicated: Secondary | ICD-10-CM | POA: Diagnosis not present

## 2016-02-19 DIAGNOSIS — E782 Mixed hyperlipidemia: Secondary | ICD-10-CM | POA: Diagnosis not present

## 2016-02-19 DIAGNOSIS — F331 Major depressive disorder, recurrent, moderate: Secondary | ICD-10-CM | POA: Diagnosis not present

## 2016-02-19 DIAGNOSIS — E119 Type 2 diabetes mellitus without complications: Secondary | ICD-10-CM | POA: Diagnosis not present

## 2016-02-19 DIAGNOSIS — E6609 Other obesity due to excess calories: Secondary | ICD-10-CM | POA: Diagnosis not present

## 2016-03-04 DIAGNOSIS — Z6836 Body mass index (BMI) 36.0-36.9, adult: Secondary | ICD-10-CM | POA: Diagnosis not present

## 2016-03-04 DIAGNOSIS — L03115 Cellulitis of right lower limb: Secondary | ICD-10-CM | POA: Diagnosis not present

## 2016-03-04 DIAGNOSIS — E1165 Type 2 diabetes mellitus with hyperglycemia: Secondary | ICD-10-CM | POA: Diagnosis not present

## 2016-03-04 DIAGNOSIS — E782 Mixed hyperlipidemia: Secondary | ICD-10-CM | POA: Diagnosis not present

## 2016-04-16 ENCOUNTER — Other Ambulatory Visit: Payer: Self-pay | Admitting: Cardiology

## 2016-04-22 ENCOUNTER — Encounter: Payer: Self-pay | Admitting: *Deleted

## 2016-04-22 NOTE — Progress Notes (Signed)
Cardiology Office Note  Date: 04/23/2016   ID: AZIR MUZYKA, DOB 25-Dec-1965, MRN [id number]  PCP: Gar Ponto, MD  Primary Cardiologist: Rozann Lesches, MD   Chief Complaint  Patient presents with  . Coronary Artery Disease  . Preoperative evaluation    History of Present Illness: [name] is a medically complex 50 y.o. male last seen in October 2016. He is here today with his niece. He tells me that he is being considered for right foot surgery with Dr. Irving Shows, I do not have the details or planned anesthesia as yet. Since we last met, he denies having any significant progression in angina symptoms or increasing nitroglycerin requirement. It sounds like his blood pressure has not been optimally controlled, he states his wife checks it regularly, she is an Therapist, sports. He does report compliance with his medications.  Current medications reviewed, cardiac regimen includes aspirin, Norvasc, Coreg, chlorthalidone, Plavix, Imdur, Cozaar, Aldactone, Crestor, and as needed nitroglycerin.  I reviewed his ECG today which shows sinus rhythm with left atrial enlargement, old anterior infarct pattern, and nonspecific ST-T changes. He underwent a cardiac catheterization within the last 2 years that demonstrated patent bypass grafts.  He follows regularly with Dr. Quillian Quince for primary care. Reports having lab work within the last few months. Has been working on better glucose control.  Past Medical History:  Diagnosis Date  . Anaphylaxis    IGE mediated  . Anxiety   . Arthritis   . Cardiomyopathy ([place])   . COPD (chronic obstructive pulmonary disease) ([place])   . Coronary atherosclerosis of native coronary artery    Multivessel status post CABG 2010  . Depression   . Essential hypertension, benign   . History of CVA (cerebrovascular accident) 01/2012   Right posterior frontal cortical and subcortical brain by MRI, no hemorrhage. Carotid Dopplers showed only 1-50% bilateral ICA  stenoses. Echocardiogram showed LVEF 50%, no major valvular abnormalities.  . Mixed hyperlipidemia   . Myocardial infarction 2010  . OSA (obstructive sleep apnea)   . Type 2 diabetes mellitus ([place])     Past Surgical History:  Procedure Laterality Date  . CHOLECYSTECTOMY    . CORONARY ARTERY BYPASS GRAFT  2010   LIMA to LAD, SVG to diagonal, SVG to OM1 and OM 2, SVG to RCA  . DENTAL SURGERY  2003  . GASTRIC BYPASS  2010  . HERNIA REPAIR  2011, 2012  . INCISIONAL HERNIA REPAIR N/A 07/23/2013   Procedure: HERNIA REPAIR INCISIONAL WITH MESH;  Surgeon: Jamesetta So, MD;  Location: [facility];  Service: General;  Laterality: N/A;  . INSERTION OF MESH N/A 07/23/2013   Procedure: INSERTION OF MESH;  Surgeon: Jamesetta So, MD;  Location: [facility];  Service: General;  Laterality: N/A;  . LEFT HEART CATHETERIZATION WITH CORONARY/GRAFT ANGIOGRAM N/A 11/01/2013   Procedure: LEFT HEART CATHETERIZATION WITH Beatrix Fetters;  Surgeon: Blane Ohara, MD;  Location: [facility] CATH LAB;  Service: Cardiovascular;  Laterality: N/A;  . TOE AMPUTATION  1998   right 1st and 2nd toe  . TONSILECTOMY, ADENOIDECTOMY, BILATERAL MYRINGOTOMY AND TUBES    . TONSILLECTOMY    . VENTRAL HERNIA REPAIR N/A 10/28/2012   Procedure: LAPAROSCOPIC VENTRAL HERNIA;  Surgeon: Donato Heinz, MD;  Location: [facility];  Service: General;  Laterality: N/A;    Current Outpatient Prescriptions  Medication Sig Dispense Refill  . acetaminophen (TYLENOL) 500 MG tablet Take 1,000 mg by mouth every 6 (six) hours as needed for mild pain,  moderate pain or headache.     . albuterol (PROVENTIL HFA;VENTOLIN HFA) 108 (90 BASE) MCG/ACT inhaler Inhale 2 puffs into the lungs every 6 (six) hours as needed for wheezing or shortness of breath.    . ALPRAZolam (XANAX) 1 MG tablet Take 1 mg by mouth 4 (four) times daily as needed for anxiety.     [name] amLODipine (NORVASC) 10 MG tablet Take 10 mg by mouth daily.    [name] aspirin EC 325 MG tablet Take 325 mg by  mouth daily.    . carvedilol (COREG) 25 MG tablet Take 1 tablet (25 mg total) by mouth 2 (two) times daily with a meal. 60 tablet 6  . chlorthalidone (HYGROTON) 25 MG tablet Take 25 mg by mouth daily.    . clopidogrel (PLAVIX) 75 MG tablet TAKE (1) TABLET BY MOUTH ONCE DAILY. 30 tablet 6  . DULoxetine (CYMBALTA) 60 MG capsule Take 60 mg by mouth daily.    . ergocalciferol (VITAMIN D2) 50000 UNITS capsule Take 50,000 Units by mouth once a week.    . fluticasone (CUTIVATE) 0.05 % cream Apply 1 application topically 2 (two) times daily.    . isosorbide mononitrate (IMDUR) 60 MG 24 hr tablet Take 60 mg by mouth 2 (two) times daily.     [name] losartan (COZAAR) 100 MG tablet TAKE (1) TABLET BY MOUTH ONCE DAILY. 30 tablet 0  . nitroGLYCERIN (NITROSTAT) 0.4 MG SL tablet Place 0.4 mg under the tongue every 5 (five) minutes as needed. For chest pain    . rosuvastatin (CRESTOR) 40 MG tablet Take 40 mg by mouth daily.    [name] spironolactone (ALDACTONE) 25 MG tablet Take 1 tablet (25 mg total) by mouth daily. 90 tablet 3  . traMADol (ULTRAM) 50 MG tablet Take 50 mg by mouth 4 (four) times daily. For pain    . hydrALAZINE (APRESOLINE) 25 MG tablet Take 1 tablet (25 mg total) by mouth 2 (two) times daily. 30 tablet 3   No current facility-administered medications for this visit.    Allergies:  Contrast media [iodinated diagnostic agents]; Ibuprofen; and Nsaids   Social History: The patient  reports that he has been smoking Cigarettes.  He started smoking about 33 years ago. He has a 22.50 pack-year smoking history. He has never used smokeless tobacco. He reports that he drinks alcohol. He reports that he does not use drugs.  ROS:  Please see the history of present illness. Otherwise, complete review of systems is positive for right foot pain with ambulation.  All other systems are reviewed and negative.   Physical Exam: VS:  BP (!) 177/80   Pulse 81   Ht '5\' 11"'  (1.803 m)   Wt 265 lb 3.2 oz (120.3 kg)   SpO2  98%   BMI 36.99 kg/m , BMI Body mass index is 36.99 kg/m.  Wt Readings from Last 3 Encounters:  04/23/16 265 lb 3.2 oz (120.3 kg)  04/19/15 262 lb (118.8 kg)  04/18/15 262 lb (118.8 kg)    General: Obese male, appears comfortable at rest. HEENT: Conjunctiva and lids normal, oropharynx clear. Neck: Supple, no elevated JVP or carotid bruits, no thyromegaly. Lungs: Clear to auscultation, nonlabored breathing at rest. Cardiac: Regular rate and rhythm, S4, no significant systolic murmur, no pericardial rub. Abdomen: Soft, nontender, bowel sounds present, no guarding or rebound. Extremities: No pitting edema, distal pulses 1-2+. Status post right first and second toe agitations. Skin: Warm and dry. Musculoskeletal: No kyphosis. Neuropsychiatric: Alert and oriented x3,  affect grossly appropriate.  ECG: I personally reviewed the tracing from 04/18/2015 which showed sinus rhythm with old anterior infarct pattern and nonspecific ST-T wave abnormalities.  Recent Labwork:  August 2016: Cholesterol 195, triglycerides 191, HDL 29, LDL 128, BUN 19, creatinine 1.0, potassium 4.3, AST 17, ALT 26   Other Studies Reviewed Today:  Echocardiogram 01/20/2014: Study Conclusions  - Procedure narrative: Transthoracic echocardiography. Image quality was poor. The study was technically difficult, as a result of poor sound wave transmission and body habitus. Intravenous contrast (Definity) was administered. - Left ventricle: The cavity size was mildly dilated. Systolic function was low normal to mildly reduced. The estimated ejection fraction was in the range of 45% to 50%. Features are consistent with a pseudonormal left ventricular filling pattern, with concomitant abnormal relaxation and increased filling pressure (grade 2 diastolic dysfunction). Mildly elevated filling pressures. Mild posterior wall thickening. - Regional wall motion abnormality: Moderate hypokinesis of  the mid-apical anterior, mid anteroseptal, mid anterolateral, apical septal, apical lateral, and apical myocardium. - Aortic valve: There was mild regurgitation. - Left atrium: The atrium was mildly dilated.  Cardiac catheterization 11/01/2013: Coronary angiography: Coronary dominance: right  Left mainstem: The left main is patent without obstructive disease   Left anterior descending (LAD): The LAD is totally occluded at the proximal portion.  Left circumflex (LCx): The left circumflex is severely diseased with 80-90% proximal vessel stenosis. The obtuse marginal branches are all occluded. The left PLA branches which are very small, are patent.  Right coronary artery (RCA): The native RCA is totally occluded.  LIMA to LAD: Widely patent throughout. The native LAD is diffusely diseased but patent throughout its course. The vessel retrograde fills back through the first diagonal. The LAD wraps around the left ventricular apex without significant disease.  Saphenous vein graft to first diagonal: Widely patent without significant obstruction.  Saphenous vein graft sequential to OM1 and OM 2 is patent without significant disease. There are some areas of dilatation. The vein is particularly dilated between the OM1 and OM 2 sequence.  Saphenous vein graft to PDA: Patent throughout with no obstructive disease.  Left ventriculography: There is severe hypokinesis of the anterolateral and periapical walls. The basal anterior and basal and midinferior walls contract normally. The estimated LVEF is 35%.  Final Conclusions:   1. Severe native three-vessel coronary artery disease with total occlusion of the RCA, total occlusion of the LAD, and total occlusion of the obtuse marginal branch of the left circumflex  2. Status post aortocoronary bypass surgery with continued patency of the LIMA to LAD, saphenous vein graft to first diagonal, sequential saphenous vein graft to OM1 and OM 2,  and saphenous vein graft to the right PDA  3. Severe segmental LV systolic dysfunction  Assessment and Plan:  1. Preoperative evaluation prior to right foot surgery by Dr. Irving Shows, details and type of anesthesia are not yet clear. Nursing to contact office to obtain more information and preoperative assessment form.  2. Multivessel CAD status post CABG in 2010. Patient had documentation of patent bypass grafts by cardiac catheterization in April 2015, and he does not report any progressive angina symptoms on medical therapy. ECG reviewed.  3. Essential hypertension, on multimodal therapy. We have discussed diet and weight loss. Also adding hydralazine 25 mg twice daily.  4. History of cardiomyopathy with LVEF 45-50% range. Reports stable NYHA class II dyspnea, no leg edema or orthopnea.  Current medicines were reviewed with the patient today.   Orders Placed This Encounter  Procedures  . EKG 12-Lead    Disposition: Obtain records from Dr. Tally Due office, patient to follow-up with me routinely in 3 months.  Signed, Satira Sark, MD, [facility] 04/23/2016 2:20 PM    [place] [facility] [place], [place], [place] [address] Phone: [phone]; Fax: [phone]

## 2016-04-23 ENCOUNTER — Encounter: Payer: Self-pay | Admitting: Cardiology

## 2016-04-23 ENCOUNTER — Ambulatory Visit (INDEPENDENT_AMBULATORY_CARE_PROVIDER_SITE_OTHER): Payer: Medicare Other | Admitting: Cardiology

## 2016-04-23 ENCOUNTER — Encounter: Payer: Self-pay | Admitting: *Deleted

## 2016-04-23 ENCOUNTER — Telehealth: Payer: Self-pay | Admitting: *Deleted

## 2016-04-23 VITALS — BP 177/80 | HR 81 | Ht 71.0 in | Wt 265.2 lb

## 2016-04-23 DIAGNOSIS — H52223 Regular astigmatism, bilateral: Secondary | ICD-10-CM | POA: Diagnosis not present

## 2016-04-23 DIAGNOSIS — I429 Cardiomyopathy, unspecified: Secondary | ICD-10-CM

## 2016-04-23 DIAGNOSIS — I2510 Atherosclerotic heart disease of native coronary artery without angina pectoris: Secondary | ICD-10-CM

## 2016-04-23 DIAGNOSIS — I10 Essential (primary) hypertension: Secondary | ICD-10-CM

## 2016-04-23 DIAGNOSIS — Z01810 Encounter for preprocedural cardiovascular examination: Secondary | ICD-10-CM | POA: Diagnosis not present

## 2016-04-23 DIAGNOSIS — H5203 Hypermetropia, bilateral: Secondary | ICD-10-CM | POA: Diagnosis not present

## 2016-04-23 DIAGNOSIS — E119 Type 2 diabetes mellitus without complications: Secondary | ICD-10-CM | POA: Diagnosis not present

## 2016-04-23 DIAGNOSIS — H524 Presbyopia: Secondary | ICD-10-CM | POA: Diagnosis not present

## 2016-04-23 MED ORDER — HYDRALAZINE HCL 25 MG PO TABS: 25.0000 mg | ORAL_TABLET | Freq: Two times a day (BID) | ORAL | 3 refills | Status: DC

## 2016-04-23 NOTE — Telephone Encounter (Signed)
Spoke with Dr. Irving Shows - says they didn't have clearance form - explained outpatient procedure will be done under IV sedation not general anesthesia at 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 Requesting we fax office note for cardiac clearance. Forwarded to provider

## 2016-04-23 NOTE — Patient Instructions (Signed)
Your physician recommends that you schedule a follow-up appointment in: [place]  Your physician has recommended you make the following change in your medication:   START HYDRALAZINE 25 MG TWICE DAILY  Thank you for choosing [facility]!!

## 2016-04-23 NOTE — Telephone Encounter (Signed)
Patient should be able to proceed with planned neuroma resection under IV sedation at an acceptable peri-procedural cardiac risk. He has been clinically stable on medical therapy. We did add hydralazine for better blood pressure control. No further cardiac testing planned at this time, a follow-up visit has been arranged in 3 months.

## 2016-04-23 NOTE — Telephone Encounter (Signed)
Routed to Dr. Irving Shows along with today's office notes.

## 2016-04-24 ENCOUNTER — Telehealth: Payer: Self-pay | Admitting: *Deleted

## 2016-04-24 MED ORDER — HYDRALAZINE HCL 25 MG PO TABS: 25.0000 mg | ORAL_TABLET | Freq: Two times a day (BID) | ORAL | 3 refills | Status: DC

## 2016-04-24 NOTE — Telephone Encounter (Signed)
Patient called stating "Im in [facility] drive thru to pick up my new prescription that I was put on yesterday by Dr. Domenic Polite and the pharmacist is saying that they didn't receive anything." Patient advised that his hydralazine 25 mg one twice daily was sent to [facility] on yesterday #30 and was showing receipt that it was received. Contacted [facility] and spoke with Lanny Hurst (pharmacist), Lanny Hurst confirmed that the prescription was filled yesterday. Nurse requested that the quantity be corrected to #60 instead of #30. Quantity changed.

## 2016-04-29 DIAGNOSIS — Z7902 Long term (current) use of antithrombotics/antiplatelets: Secondary | ICD-10-CM | POA: Diagnosis not present

## 2016-04-29 DIAGNOSIS — Z7982 Long term (current) use of aspirin: Secondary | ICD-10-CM | POA: Diagnosis not present

## 2016-04-29 DIAGNOSIS — F17200 Nicotine dependence, unspecified, uncomplicated: Secondary | ICD-10-CM | POA: Diagnosis not present

## 2016-04-29 DIAGNOSIS — I2510 Atherosclerotic heart disease of native coronary artery without angina pectoris: Secondary | ICD-10-CM | POA: Diagnosis not present

## 2016-04-29 DIAGNOSIS — X58XXXA Exposure to other specified factors, initial encounter: Secondary | ICD-10-CM | POA: Diagnosis not present

## 2016-04-29 DIAGNOSIS — Z79899 Other long term (current) drug therapy: Secondary | ICD-10-CM | POA: Diagnosis not present

## 2016-04-29 DIAGNOSIS — I10 Essential (primary) hypertension: Secondary | ICD-10-CM | POA: Diagnosis not present

## 2016-04-29 DIAGNOSIS — S39012A Strain of muscle, fascia and tendon of lower back, initial encounter: Secondary | ICD-10-CM | POA: Diagnosis not present

## 2016-04-29 DIAGNOSIS — F329 Major depressive disorder, single episode, unspecified: Secondary | ICD-10-CM | POA: Diagnosis not present

## 2016-04-29 DIAGNOSIS — E119 Type 2 diabetes mellitus without complications: Secondary | ICD-10-CM | POA: Diagnosis not present

## 2016-06-10 DIAGNOSIS — E8881 Metabolic syndrome: Secondary | ICD-10-CM | POA: Diagnosis not present

## 2016-06-10 DIAGNOSIS — I10 Essential (primary) hypertension: Secondary | ICD-10-CM | POA: Diagnosis not present

## 2016-06-10 DIAGNOSIS — I25111 Atherosclerotic heart disease of native coronary artery with angina pectoris with documented spasm: Secondary | ICD-10-CM | POA: Diagnosis not present

## 2016-06-10 DIAGNOSIS — E782 Mixed hyperlipidemia: Secondary | ICD-10-CM | POA: Diagnosis not present

## 2016-06-10 DIAGNOSIS — K219 Gastro-esophageal reflux disease without esophagitis: Secondary | ICD-10-CM | POA: Diagnosis not present

## 2016-06-10 DIAGNOSIS — E1165 Type 2 diabetes mellitus with hyperglycemia: Secondary | ICD-10-CM | POA: Diagnosis not present

## 2016-06-13 DIAGNOSIS — R05 Cough: Secondary | ICD-10-CM | POA: Diagnosis not present

## 2016-06-13 DIAGNOSIS — E782 Mixed hyperlipidemia: Secondary | ICD-10-CM | POA: Diagnosis not present

## 2016-06-13 DIAGNOSIS — J209 Acute bronchitis, unspecified: Secondary | ICD-10-CM | POA: Diagnosis not present

## 2016-06-13 DIAGNOSIS — Z6837 Body mass index (BMI) 37.0-37.9, adult: Secondary | ICD-10-CM | POA: Diagnosis not present

## 2016-06-13 DIAGNOSIS — Z23 Encounter for immunization: Secondary | ICD-10-CM | POA: Diagnosis not present

## 2016-06-13 DIAGNOSIS — E8881 Metabolic syndrome: Secondary | ICD-10-CM | POA: Diagnosis not present

## 2016-06-13 DIAGNOSIS — F17210 Nicotine dependence, cigarettes, uncomplicated: Secondary | ICD-10-CM | POA: Diagnosis not present

## 2016-06-13 DIAGNOSIS — E119 Type 2 diabetes mellitus without complications: Secondary | ICD-10-CM | POA: Diagnosis not present

## 2016-07-24 NOTE — Progress Notes (Signed)
Cardiology Office Note  Date: 07/25/2016   ID: [name], DOB Apr 07, 1966, MRN [id number]  PCP: Gar Ponto, MD  Primary Cardiologist: Rozann Lesches, MD   Chief Complaint  Patient presents with  . Coronary Artery Disease    History of Present Illness: [name] is a medically complex 51 y.o. male last seen in October 2017. He presents for a routine follow-up visit. Since last encounter he denies having any significant angina to require nitroglycerin. He is trying to quit smoking, using a nicotine patch at this time. His wife is also trying to quit. They have been walking some for exercise.  At the last visit hydralazine was added. Blood pressure remains elevated today. He states that it has been "up and down" and that he is recording his home blood pressures to review with Dr. Quillian Quince at an upcoming visit. He may need further up titration. Otherwise his cardiac regimen remains stable as outlined below.  Past Medical History:  Diagnosis Date  . Anaphylaxis    IGE mediated  . Anxiety   . Arthritis   . Cardiomyopathy ([place])   . COPD (chronic obstructive pulmonary disease) ([place])   . Coronary atherosclerosis of native coronary artery    Multivessel status post CABG 2010  . Depression   . Essential hypertension, benign   . History of CVA (cerebrovascular accident) 01/2012   Right posterior frontal cortical and subcortical brain by MRI, no hemorrhage. Carotid Dopplers showed only 1-50% bilateral ICA stenoses. Echocardiogram showed LVEF 50%, no major valvular abnormalities.  . Mixed hyperlipidemia   . Myocardial infarction 2010  . OSA (obstructive sleep apnea)   . Type 2 diabetes mellitus ([place])     Past Surgical History:  Procedure Laterality Date  . CHOLECYSTECTOMY    . CORONARY ARTERY BYPASS GRAFT  2010   LIMA to LAD, SVG to diagonal, SVG to OM1 and OM 2, SVG to RCA  . DENTAL SURGERY  2003  . GASTRIC BYPASS  2010  . HERNIA REPAIR  2011, 2012  .  INCISIONAL HERNIA REPAIR N/A 07/23/2013   Procedure: HERNIA REPAIR INCISIONAL WITH MESH;  Surgeon: Jamesetta So, MD;  Location: [facility];  Service: General;  Laterality: N/A;  . INSERTION OF MESH N/A 07/23/2013   Procedure: INSERTION OF MESH;  Surgeon: Jamesetta So, MD;  Location: [facility];  Service: General;  Laterality: N/A;  . LEFT HEART CATHETERIZATION WITH CORONARY/GRAFT ANGIOGRAM N/A 11/01/2013   Procedure: LEFT HEART CATHETERIZATION WITH Beatrix Fetters;  Surgeon: Blane Ohara, MD;  Location: [facility] CATH LAB;  Service: Cardiovascular;  Laterality: N/A;  . TOE AMPUTATION  1998   right 1st and 2nd toe  . TONSILECTOMY, ADENOIDECTOMY, BILATERAL MYRINGOTOMY AND TUBES    . TONSILLECTOMY    . VENTRAL HERNIA REPAIR N/A 10/28/2012   Procedure: LAPAROSCOPIC VENTRAL HERNIA;  Surgeon: Donato Heinz, MD;  Location: [facility];  Service: General;  Laterality: N/A;    Current Outpatient Prescriptions  Medication Sig Dispense Refill  . acetaminophen (TYLENOL) 500 MG tablet Take 1,000 mg by mouth every 6 (six) hours as needed for mild pain, moderate pain or headache.     . albuterol (PROVENTIL HFA;VENTOLIN HFA) 108 (90 BASE) MCG/ACT inhaler Inhale 2 puffs into the lungs every 6 (six) hours as needed for wheezing or shortness of breath.    . ALPRAZolam (XANAX) 1 MG tablet Take 1 mg by mouth 4 (four) times daily as needed for anxiety.     [name] amLODipine (NORVASC)  10 MG tablet Take 10 mg by mouth daily.    [name] aspirin EC 325 MG tablet Take 325 mg by mouth daily.    . carvedilol (COREG) 25 MG tablet Take 1 tablet (25 mg total) by mouth 2 (two) times daily with a meal. 60 tablet 6  . chlorthalidone (HYGROTON) 25 MG tablet Take 25 mg by mouth daily.    . clopidogrel (PLAVIX) 75 MG tablet TAKE (1) TABLET BY MOUTH ONCE DAILY. 30 tablet 6  . DULoxetine (CYMBALTA) 60 MG capsule Take 60 mg by mouth daily.    . ergocalciferol (VITAMIN D2) 50000 UNITS capsule Take 50,000 Units by mouth once a week.    . fluticasone  (CUTIVATE) 0.05 % cream Apply 1 application topically 2 (two) times daily.    . isosorbide mononitrate (IMDUR) 60 MG 24 hr tablet Take 60 mg by mouth 2 (two) times daily.     [name] losartan (COZAAR) 100 MG tablet TAKE (1) TABLET BY MOUTH ONCE DAILY. 30 tablet 0  . nitroGLYCERIN (NITROSTAT) 0.4 MG SL tablet Place 0.4 mg under the tongue every 5 (five) minutes as needed. For chest pain    . rosuvastatin (CRESTOR) 40 MG tablet Take 40 mg by mouth daily.    [name] spironolactone (ALDACTONE) 25 MG tablet Take 1 tablet (25 mg total) by mouth daily. 90 tablet 3  . traMADol (ULTRAM) 50 MG tablet Take 50 mg by mouth 4 (four) times daily. For pain    . hydrALAZINE (APRESOLINE) 25 MG tablet Take 1 tablet (25 mg total) by mouth 2 (two) times daily. 60 tablet 3   No current facility-administered medications for this visit.    Allergies:  Contrast media [iodinated diagnostic agents]; Ibuprofen; and Nsaids   Social History: The patient  reports that he has been smoking Cigarettes.  He started smoking about 34 years ago. He has a 22.50 pack-year smoking history. He has never used smokeless tobacco. He reports that he drinks alcohol. He reports that he does not use drugs.   ROS:  Please see the history of present illness. Otherwise, complete review of systems is positive for arthritic pains.  All other systems are reviewed and negative.   Physical Exam: VS:  BP (!) 170/82   Pulse 88   Ht 5\' 11"  (1.803 m)   Wt 259 lb (117.5 kg)   SpO2 98%   BMI 36.12 kg/m , BMI Body mass index is 36.12 kg/m.  Wt Readings from Last 3 Encounters:  07/25/16 259 lb (117.5 kg)  04/23/16 265 lb 3.2 oz (120.3 kg)  04/19/15 262 lb (118.8 kg)    General: Obese male, appears comfortable at rest. HEENT: Conjunctiva and lids normal, oropharynx clear. Neck: Supple, no elevated JVP or carotid bruits, no thyromegaly. Lungs: Clear to auscultation, nonlabored breathing at rest. Cardiac: Regular rate and rhythm, S4, no significant systolic  murmur, no pericardial rub. Abdomen: Soft, nontender, bowel sounds present, no guarding or rebound. Extremities: No pitting edema, distal pulses 1-2+. Status post right first and second toe agitations. Skin: Warm and dry. Musculoskeletal: No kyphosis. Neuropsychiatric: Alert and oriented x3, affect grossly appropriate.  ECG: I personally reviewed the tracing from 04/23/2016 which showed sinus rhythm with left atrial enlargement, increased voltage, and old anterior infarct pattern.  Recent Labwork:  August 2016: Cholesterol 195, triglycerides 191, HDL 29, LDL 128, BUN 19, creatinine 1.0, potassium 4.3, AST 17, ALT 26   Other Studies Reviewed Today:  Echocardiogram 01/20/2014: Study Conclusions  - Procedure narrative: Transthoracic echocardiography. Image  quality was poor. The study was technically difficult, as a result of poor sound wave transmission and body habitus. Intravenous contrast (Definity) was administered. - Left ventricle: The cavity size was mildly dilated. Systolic function was low normal to mildly reduced. The estimated ejection fraction was in the range of 45% to 50%. Features are consistent with a pseudonormal left ventricular filling pattern, with concomitant abnormal relaxation and increased filling pressure (grade 2 diastolic dysfunction). Mildly elevated filling pressures. Mild posterior wall thickening. - Regional wall motion abnormality: Moderate hypokinesis of the mid-apical anterior, mid anteroseptal, mid anterolateral, apical septal, apical lateral, and apical myocardium. - Aortic valve: There was mild regurgitation. - Left atrium: The atrium was mildly dilated.  Cardiac catheterization 11/01/2013: Coronary angiography: Coronary dominance:right  Left mainstem:The left main is patent without obstructive disease   Left anterior descending (LAD):The LAD is totally occluded at the proximal portion.  Left circumflex (LCx):The  left circumflex is severely diseased with 80-90% proximal vessel stenosis. The obtuse marginal branches are all occluded. The left PLA branches which are very small, are patent.  Right coronary artery (RCA):The native RCA is totally occluded.  LIMA to LAD: Widely patent throughout. The native LAD is diffusely diseased but patent throughout its course. The vessel retrograde fills back through the first diagonal. The LAD wraps around the left ventricular apex without significant disease.  Saphenous vein graft to first diagonal: Widely patent without significant obstruction.  Saphenous vein graft sequential to OM1 and OM 2 is patent without significant disease. There are some areas of dilatation. The vein is particularly dilated between the OM1 and OM 2 sequence.  Saphenous vein graft to PDA: Patent throughout with no obstructive disease.  Left ventriculography: There is severe hypokinesis of the anterolateral and periapical walls. The basal anterior and basal and midinferior walls contract normally. The estimated LVEF is 35%.  Final Conclusions:  1. Severe native three-vessel coronary artery disease with total occlusion of the RCA, total occlusion of the LAD, and total occlusion of the obtuse marginal branch of the left circumflex  2. Status post aortocoronary bypass surgery with continued patency of the LIMA to LAD, saphenous vein graft to first diagonal, sequential saphenous vein graft to OM1 and OM 2, and saphenous vein graft to the right PDA  3. Severe segmental LV systolic dysfunction  Assessment and Plan:  1. Multivessel CAD status post CABG in 2010 with patent bypass grafts as of 2015. He is symptomatically stable on medical therapy and we will continue with observation.  2. Essential hypertension, continue present regimen although I suspect that he will need further up titration of hydralazine. He is making a home blood pressure list and plans to review this soon with Dr.  Quillian Quince.  3. Ischemic cardiomyopathy with LVEF 45-50% by last assessment.  4. Hyperlipidemia, continues on Crestor.  Current medicines were reviewed with the patient today.  Disposition: Follow-up in 6 months, sooner if needed.  Signed, Satira Sark, MD, [facility] 07/25/2016 9:21 AM    [place] at [place], [place], [place] [address] Phone: [phone]; Fax: [phone]

## 2016-07-25 ENCOUNTER — Encounter: Payer: Self-pay | Admitting: Cardiology

## 2016-07-25 ENCOUNTER — Ambulatory Visit (INDEPENDENT_AMBULATORY_CARE_PROVIDER_SITE_OTHER): Payer: Medicare Other | Admitting: Cardiology

## 2016-07-25 VITALS — BP 170/82 | HR 88 | Ht 71.0 in | Wt 259.0 lb

## 2016-07-25 DIAGNOSIS — I2510 Atherosclerotic heart disease of native coronary artery without angina pectoris: Secondary | ICD-10-CM | POA: Diagnosis not present

## 2016-07-25 DIAGNOSIS — E782 Mixed hyperlipidemia: Secondary | ICD-10-CM

## 2016-07-25 DIAGNOSIS — I10 Essential (primary) hypertension: Secondary | ICD-10-CM | POA: Diagnosis not present

## 2016-07-25 DIAGNOSIS — I429 Cardiomyopathy, unspecified: Secondary | ICD-10-CM

## 2016-07-25 NOTE — Patient Instructions (Signed)

## 2016-07-30 DIAGNOSIS — Z7902 Long term (current) use of antithrombotics/antiplatelets: Secondary | ICD-10-CM | POA: Diagnosis not present

## 2016-07-30 DIAGNOSIS — M545 Low back pain: Secondary | ICD-10-CM | POA: Diagnosis not present

## 2016-07-30 DIAGNOSIS — Z79899 Other long term (current) drug therapy: Secondary | ICD-10-CM | POA: Diagnosis not present

## 2016-07-30 DIAGNOSIS — M5134 Other intervertebral disc degeneration, thoracic region: Secondary | ICD-10-CM | POA: Diagnosis not present

## 2016-07-30 DIAGNOSIS — F17200 Nicotine dependence, unspecified, uncomplicated: Secondary | ICD-10-CM | POA: Diagnosis not present

## 2016-07-30 DIAGNOSIS — Z7982 Long term (current) use of aspirin: Secondary | ICD-10-CM | POA: Diagnosis not present

## 2016-07-30 DIAGNOSIS — E119 Type 2 diabetes mellitus without complications: Secondary | ICD-10-CM | POA: Diagnosis not present

## 2016-07-30 DIAGNOSIS — I10 Essential (primary) hypertension: Secondary | ICD-10-CM | POA: Diagnosis not present

## 2016-07-30 DIAGNOSIS — I2510 Atherosclerotic heart disease of native coronary artery without angina pectoris: Secondary | ICD-10-CM | POA: Diagnosis not present

## 2016-07-30 DIAGNOSIS — F329 Major depressive disorder, single episode, unspecified: Secondary | ICD-10-CM | POA: Diagnosis not present

## 2016-08-02 ENCOUNTER — Encounter (HOSPITAL_COMMUNITY): Payer: Self-pay | Admitting: Emergency Medicine

## 2016-08-02 ENCOUNTER — Emergency Department (HOSPITAL_COMMUNITY)
Admission: EM | Admit: 2016-08-02 | Discharge: 2016-08-03 | Disposition: A | Discharge status: Home / Self Care | Payer: Medicare Other | Attending: Emergency Medicine | Admitting: Emergency Medicine

## 2016-08-02 DIAGNOSIS — I252 Old myocardial infarction: Secondary | ICD-10-CM | POA: Diagnosis not present

## 2016-08-02 DIAGNOSIS — R067 Sneezing: Secondary | ICD-10-CM | POA: Diagnosis present

## 2016-08-02 DIAGNOSIS — M5134 Other intervertebral disc degeneration, thoracic region: Secondary | ICD-10-CM | POA: Diagnosis not present

## 2016-08-02 DIAGNOSIS — E119 Type 2 diabetes mellitus without complications: Secondary | ICD-10-CM | POA: Diagnosis not present

## 2016-08-02 DIAGNOSIS — J40 Bronchitis, not specified as acute or chronic: Secondary | ICD-10-CM | POA: Insufficient documentation

## 2016-08-02 DIAGNOSIS — I2510 Atherosclerotic heart disease of native coronary artery without angina pectoris: Secondary | ICD-10-CM | POA: Insufficient documentation

## 2016-08-02 DIAGNOSIS — J449 Chronic obstructive pulmonary disease, unspecified: Secondary | ICD-10-CM | POA: Diagnosis not present

## 2016-08-02 DIAGNOSIS — I10 Essential (primary) hypertension: Secondary | ICD-10-CM | POA: Diagnosis not present

## 2016-08-02 DIAGNOSIS — Z79899 Other long term (current) drug therapy: Secondary | ICD-10-CM | POA: Insufficient documentation

## 2016-08-02 DIAGNOSIS — Z7982 Long term (current) use of aspirin: Secondary | ICD-10-CM | POA: Insufficient documentation

## 2016-08-02 DIAGNOSIS — F17210 Nicotine dependence, cigarettes, uncomplicated: Secondary | ICD-10-CM | POA: Diagnosis not present

## 2016-08-02 MED ORDER — ALBUTEROL SULFATE (2.5 MG/3ML) 0.083% IN NEBU: 2.5000 mg | INHALATION_SOLUTION | Freq: Once | RESPIRATORY_TRACT | Status: DC

## 2016-08-02 MED ORDER — MORPHINE SULFATE (PF) 4 MG/ML IV SOLN
8.0000 mg | Freq: Once | INTRAVENOUS | Status: AC
  Administered 2016-08-02: 8 mg via INTRAMUSCULAR
  Filled 2016-08-02: qty 2

## 2016-08-02 MED ORDER — ONDANSETRON HCL 4 MG PO TABS
4.0000 mg | ORAL_TABLET | Freq: Once | ORAL | Status: AC
  Administered 2016-08-02: 4 mg via ORAL
  Filled 2016-08-02: qty 1

## 2016-08-02 MED ORDER — IPRATROPIUM BROMIDE 0.02 % IN SOLN: 0.5000 mg | Freq: Once | RESPIRATORY_TRACT | Status: DC

## 2016-08-02 MED ORDER — PREDNISONE 50 MG PO TABS
60.0000 mg | ORAL_TABLET | Freq: Once | ORAL | Status: AC
  Administered 2016-08-02: 60 mg via ORAL
  Filled 2016-08-02: qty 1

## 2016-08-02 MED ORDER — DIAZEPAM 5 MG PO TABS
10.0000 mg | ORAL_TABLET | Freq: Once | ORAL | Status: AC
Start: 2016-08-02 — End: 2016-08-02
  Administered 2016-08-02: 10 mg via ORAL
  Filled 2016-08-02: qty 2

## 2016-08-02 MED ORDER — IPRATROPIUM-ALBUTEROL 0.5-2.5 (3) MG/3ML IN SOLN: 3.0000 mL | Freq: Four times a day (QID) | RESPIRATORY_TRACT | Status: DC

## 2016-08-02 MED ORDER — IPRATROPIUM-ALBUTEROL 0.5-2.5 (3) MG/3ML IN SOLN
3.0000 mL | Freq: Once | RESPIRATORY_TRACT | Status: AC
  Administered 2016-08-02: 3 mL via RESPIRATORY_TRACT
  Filled 2016-08-02: qty 3

## 2016-08-02 NOTE — ED Provider Notes (Signed)
[facility] DEPT Provider Note   CSN: [id number] Arrival date & time: 08/02/16  2137     History   Chief Complaint Chief Complaint  Patient presents with  . Back Pain    x 1 week; Chronic back pain    HPI [name] is a 51 y.o. male.  Pt states he had a sneeze and his back began to have more problem.   The history is provided by the patient.  Back Pain   This is a chronic problem. The current episode started more than 1 week ago. The problem has been gradually worsening. Associated with: sneezing and chronic back pain. The pain is present in the thoracic spine. The quality of the pain is described as burning and aching. The pain is moderate. The symptoms are aggravated by bending and certain positions. The pain is the same all the time. Pertinent negatives include no chest pain, no abdominal pain, no bowel incontinence, no perianal numbness, no bladder incontinence, no dysuria and no weakness. Treatments tried: tramadol and ice pack.    Past Medical History:  Diagnosis Date  . Anaphylaxis    IGE mediated  . Anxiety   . Arthritis   . Cardiomyopathy ([place])   . COPD (chronic obstructive pulmonary disease) ([place])   . Coronary atherosclerosis of native coronary artery    Multivessel status post CABG 2010  . Depression   . Essential hypertension, benign   . History of CVA (cerebrovascular accident) 01/2012   Right posterior frontal cortical and subcortical brain by MRI, no hemorrhage. Carotid Dopplers showed only 1-50% bilateral ICA stenoses. Echocardiogram showed LVEF 50%, no major valvular abnormalities.  . Mixed hyperlipidemia   . Myocardial infarction 2010  . OSA (obstructive sleep apnea)   . Type 2 diabetes mellitus [facility]     Patient Active Problem List   Diagnosis Date Noted  . NSTEMI (non-ST elevated myocardial infarction) ([facility]) 04/28/2014  . Secondary cardiomyopathy ([place]) 12/31/2013  . History of noncompliance with medical treatment 10/31/2013  .  Obesity (BMI 30-39.9) 10/31/2013  . Sleep apnea 10/31/2013  . Anxiety   . COPD (chronic obstructive pulmonary disease) ([place])   . History of stroke 02/07/2012  . Tobacco abuse   . Essential hypertension, benign 08/16/2008  . Hyperlipidemia   . Coronary atherosclerosis of native coronary artery     Past Surgical History:  Procedure Laterality Date  . CHOLECYSTECTOMY    . CORONARY ARTERY BYPASS GRAFT  2010   LIMA to LAD, SVG to diagonal, SVG to OM1 and OM 2, SVG to RCA  . DENTAL SURGERY  2003  . GASTRIC BYPASS  2010  . HERNIA REPAIR  2011, 2012  . INCISIONAL HERNIA REPAIR N/A 07/23/2013   Procedure: HERNIA REPAIR INCISIONAL WITH MESH;  Surgeon: Jamesetta So, MD;  Location: [facility] ORS;  Service: General;  Laterality: N/A;  . INSERTION OF MESH N/A 07/23/2013   Procedure: INSERTION OF MESH;  Surgeon: Jamesetta So, MD;  Location: [facility] ORS;  Service: General;  Laterality: N/A;  . LEFT HEART CATHETERIZATION WITH CORONARY/GRAFT ANGIOGRAM N/A 11/01/2013   Procedure: LEFT HEART CATHETERIZATION WITH Beatrix Fetters;  Surgeon: Blane Ohara, MD;  Location[facility];  Service: Cardiovascular;  Laterality: N/A;  . TOE AMPUTATION  1998   right 1st and 2nd toe  . TONSILECTOMY, ADENOIDECTOMY, BILATERAL MYRINGOTOMY AND TUBES    . TONSILLECTOMY    . VENTRAL HERNIA REPAIR N/A 10/28/2012   Procedure: LAPAROSCOPIC VENTRAL HERNIA;  Surgeon: Donato Heinz, MD;  Location: [facility] ORS;  Service: General;  Laterality: N/A;       Home Medications    Prior to Admission medications   Medication Sig Start Date End Date Taking? Authorizing Provider  acetaminophen (TYLENOL) 500 MG tablet Take 1,000 mg by mouth every 6 (six) hours as needed for mild pain, moderate pain or headache.    Yes Historical Provider, MD  albuterol (PROVENTIL HFA;VENTOLIN HFA) 108 (90 BASE) MCG/ACT inhaler Inhale 2 puffs into the lungs every 6 (six) hours as needed for wheezing or shortness of breath.   Yes Historical Provider, MD    ALPRAZolam Duanne Moron) 1 MG tablet Take 1 mg by mouth 4 (four) times daily as needed for anxiety.    Yes Historical Provider, MD  amLODipine (NORVASC) 10 MG tablet Take 10 mg by mouth daily.   Yes Historical Provider, MD  aspirin EC 325 MG tablet Take 325 mg by mouth daily.   Yes Historical Provider, MD  carvedilol (COREG) 25 MG tablet Take 1 tablet (25 mg total) by mouth 2 (two) times daily with a meal. 11/24/13  Yes Lendon Colonel, NP  clopidogrel (PLAVIX) 75 MG tablet TAKE (1) TABLET BY MOUTH ONCE DAILY. 04/16/16  Yes Satira Sark, MD  fluticasone (CUTIVATE) 0.05 % cream Apply 1 application topically 2 (two) times daily.   Yes Historical Provider, MD  hydrALAZINE (APRESOLINE) 25 MG tablet Take 1 tablet (25 mg total) by mouth 2 (two) times daily. 04/24/16 08/02/16 Yes Satira Sark, MD  isosorbide mononitrate (IMDUR) 60 MG 24 hr tablet Take 60 mg by mouth 2 (two) times daily.    Yes Historical Provider, MD  losartan (COZAAR) 100 MG tablet TAKE (1) TABLET BY MOUTH ONCE DAILY. 02/08/16  Yes Satira Sark, MD  nitroGLYCERIN (NITROSTAT) 0.4 MG SL tablet Place 0.4 mg under the tongue every 5 (five) minutes as needed. For chest pain   Yes Historical Provider, MD  rosuvastatin (CRESTOR) 40 MG tablet Take 40 mg by mouth daily.   Yes Historical Provider, MD  spironolactone (ALDACTONE) 25 MG tablet Take 1 tablet (25 mg total) by mouth daily. 05/04/14  Yes Satira Sark, MD  traMADol (ULTRAM) 50 MG tablet Take 50 mg by mouth 4 (four) times daily. For pain   Yes Historical Provider, MD  ergocalciferol (VITAMIN D2) 50000 UNITS capsule Take 50,000 Units by mouth once a week.    Historical Provider, MD    Family History Family History  Problem Relation Age of Onset  . Lung cancer Father   . Breast cancer Maternal Aunt     Social History Social History  Substance Use Topics  . Smoking status: Current Some Day Smoker    Packs/day: 0.75    Years: 30.00    Types: Cigarettes    Start date:  06/08/1982    Last attempt to quit: 09/28/2014  . Smokeless tobacco: Never Used     Comment: 1 pack every 1 1/2 days   . Alcohol use No     Comment: rarely     Allergies   Contrast media [iodinated diagnostic agents]; Ibuprofen; and Nsaids   Review of Systems Review of Systems  Constitutional: Negative for activity change.       All ROS Neg except as noted in HPI  HENT: Negative for nosebleeds.   Eyes: Negative for photophobia and discharge.  Respiratory: Positive for cough and shortness of breath. Negative for wheezing.   Cardiovascular: Negative for chest pain and palpitations.  Gastrointestinal: Negative for abdominal  pain, blood in stool and bowel incontinence.  Genitourinary: Negative for bladder incontinence, dysuria, frequency and hematuria.  Musculoskeletal: Positive for back pain. Negative for arthralgias and neck pain.  Skin: Negative.   Neurological: Negative for dizziness, seizures, speech difficulty and weakness.  Psychiatric/Behavioral: Negative for confusion and hallucinations. The patient is nervous/anxious.      Physical Exam Updated Vital Signs BP 199/86 (BP Location: Left Arm)   Pulse 93   Temp 98.4 F (36.9 C) (Oral)   Resp 18   Ht 5\' 11"  (1.803 m)   Wt 121.6 kg   SpO2 100%   BMI 37.38 kg/m   Physical Exam  Constitutional: He is oriented to person, place, and time. He appears well-developed and well-nourished.  Non-toxic appearance.  HENT:  Head: Normocephalic.  Right Ear: Tympanic membrane and external ear normal.  Left Ear: Tympanic membrane and external ear normal.  Eyes: EOM and lids are normal. Pupils are equal, round, and reactive to light.  Neck: Normal range of motion. Neck supple. Carotid bruit is not present.  Cardiovascular: Normal rate, regular rhythm, normal heart sounds, intact distal pulses and normal pulses.   Pulmonary/Chest: No respiratory distress. He has rhonchi.  Pt has symmetrical rise and fall of the chest. Pt speaks in  complete sentences. Course breath sounds present. Few scattered rhonchi present.  Abdominal: Soft. Bowel sounds are normal. There is no tenderness. There is no guarding.  Musculoskeletal:       Thoracic back: He exhibits decreased range of motion, tenderness and pain.  Lymphadenopathy:       Head (right side): No submandibular adenopathy present.       Head (left side): No submandibular adenopathy present.    He has no cervical adenopathy.  Neurological: He is alert and oriented to person, place, and time. He has normal strength. No cranial nerve deficit or sensory deficit.  Skin: Skin is warm and dry.  Psychiatric: He has a normal mood and affect. His speech is normal.  Nursing note and vitals reviewed.    ED Treatments / Results  Labs (all labs ordered are listed, but only abnormal results are displayed) Labs Reviewed - No data to display  EKG  EKG Interpretation None       Radiology No results found.  Procedures Procedures (including critical care time)  Medications Ordered in ED Medications - No data to display   Initial Impression / Assessment and Plan / ED Course  I have reviewed the triage vital signs and the nursing notes.  Pertinent labs & imaging results that were available during my care of the patient were reviewed by me and considered in my medical decision making (see chart for details).     **I have reviewed nursing notes, vital signs, and all appropriate lab and imaging results for this patient.*  Final Clinical Impressions(s) / ED Diagnoses  MDM I reviewed. He is emergency room and medical records. I reviewed the previous imaging studies. Patient has a history of degenerative disc disease multiple sites. The neurologic examination is well within normal limits at this time. No gross neurologic deficit appreciated. I discussed with the patient that this is probably an exacerbation of his existing degenerative disc disease issue. I asked him to see his  specialist for additional evaluation and management. I have ordered Decadron for the patient to add to his current medication.    Final diagnoses:  DDD (degenerative disc disease), thoracic  Bronchitis    New Prescriptions Discharge Medication List as of 08/03/2016  12:41 AM    START taking these medications   Details  dexamethasone (DECADRON) 4 MG tablet Take 1 tablet (4 mg total) by mouth 2 (two) times daily with a meal., Starting Sat 08/03/2016, Print         Lily Kocher, PA-C 08/05/16 [place], MD 08/07/16 2019

## 2016-08-02 NOTE — ED Triage Notes (Signed)
History of chronic back pain- bent over heard a pop dn now with back pain x 1 week

## 2016-08-03 MED ORDER — DEXAMETHASONE 4 MG PO TABS: 4.0000 mg | ORAL_TABLET | Freq: Two times a day (BID) | ORAL | 0 refills | Status: DC

## 2016-08-03 NOTE — Discharge Instructions (Addendum)
Heating pad to your back may be helpful. Please see your MD concerning pain control for your back. You were treated with narcotic pain medication in the ED tonight. Use caution getting around. Continue your current medication until medication adjusted by your MD. Continue your albuterol at home. Add decadron for assistance with your bronchitis.

## 2016-08-22 DIAGNOSIS — L2083 Infantile (acute) (chronic) eczema: Secondary | ICD-10-CM | POA: Diagnosis not present

## 2016-08-22 DIAGNOSIS — L03313 Cellulitis of chest wall: Secondary | ICD-10-CM | POA: Diagnosis not present

## 2016-08-22 DIAGNOSIS — I10 Essential (primary) hypertension: Secondary | ICD-10-CM | POA: Diagnosis not present

## 2016-09-18 DIAGNOSIS — G6289 Other specified polyneuropathies: Secondary | ICD-10-CM | POA: Diagnosis not present

## 2016-09-18 DIAGNOSIS — E119 Type 2 diabetes mellitus without complications: Secondary | ICD-10-CM | POA: Diagnosis not present

## 2016-09-18 DIAGNOSIS — K219 Gastro-esophageal reflux disease without esophagitis: Secondary | ICD-10-CM | POA: Diagnosis not present

## 2016-09-18 DIAGNOSIS — I10 Essential (primary) hypertension: Secondary | ICD-10-CM | POA: Diagnosis not present

## 2016-09-18 DIAGNOSIS — J4530 Mild persistent asthma, uncomplicated: Secondary | ICD-10-CM | POA: Diagnosis not present

## 2016-09-18 DIAGNOSIS — I2510 Atherosclerotic heart disease of native coronary artery without angina pectoris: Secondary | ICD-10-CM | POA: Diagnosis not present

## 2016-09-18 DIAGNOSIS — F331 Major depressive disorder, recurrent, moderate: Secondary | ICD-10-CM | POA: Diagnosis not present

## 2016-09-18 DIAGNOSIS — F17210 Nicotine dependence, cigarettes, uncomplicated: Secondary | ICD-10-CM | POA: Diagnosis not present

## 2016-09-18 DIAGNOSIS — G4733 Obstructive sleep apnea (adult) (pediatric): Secondary | ICD-10-CM | POA: Diagnosis not present

## 2016-09-18 DIAGNOSIS — E6609 Other obesity due to excess calories: Secondary | ICD-10-CM | POA: Diagnosis not present

## 2016-09-18 DIAGNOSIS — E782 Mixed hyperlipidemia: Secondary | ICD-10-CM | POA: Diagnosis not present

## 2016-09-18 DIAGNOSIS — E8881 Metabolic syndrome: Secondary | ICD-10-CM | POA: Diagnosis not present

## 2016-10-07 ENCOUNTER — Emergency Department (HOSPITAL_COMMUNITY)
Admission: EM | Admit: 2016-10-07 | Discharge: 2016-10-07 | Disposition: A | Discharge status: Home / Self Care | Payer: Medicare Other | Attending: Emergency Medicine | Admitting: Emergency Medicine

## 2016-10-07 ENCOUNTER — Emergency Department (HOSPITAL_COMMUNITY): Payer: Medicare Other

## 2016-10-07 ENCOUNTER — Encounter (HOSPITAL_COMMUNITY): Payer: Self-pay | Admitting: Emergency Medicine

## 2016-10-07 DIAGNOSIS — M79671 Pain in right foot: Secondary | ICD-10-CM | POA: Diagnosis not present

## 2016-10-07 DIAGNOSIS — I252 Old myocardial infarction: Secondary | ICD-10-CM | POA: Diagnosis not present

## 2016-10-07 DIAGNOSIS — E119 Type 2 diabetes mellitus without complications: Secondary | ICD-10-CM | POA: Diagnosis not present

## 2016-10-07 DIAGNOSIS — Z79899 Other long term (current) drug therapy: Secondary | ICD-10-CM | POA: Diagnosis not present

## 2016-10-07 DIAGNOSIS — F17210 Nicotine dependence, cigarettes, uncomplicated: Secondary | ICD-10-CM | POA: Insufficient documentation

## 2016-10-07 DIAGNOSIS — I2510 Atherosclerotic heart disease of native coronary artery without angina pectoris: Secondary | ICD-10-CM | POA: Insufficient documentation

## 2016-10-07 DIAGNOSIS — Z7982 Long term (current) use of aspirin: Secondary | ICD-10-CM | POA: Diagnosis not present

## 2016-10-07 DIAGNOSIS — I10 Essential (primary) hypertension: Secondary | ICD-10-CM | POA: Diagnosis not present

## 2016-10-07 DIAGNOSIS — J449 Chronic obstructive pulmonary disease, unspecified: Secondary | ICD-10-CM | POA: Diagnosis not present

## 2016-10-07 MED ORDER — HYDROCODONE-ACETAMINOPHEN 5-325 MG PO TABS: 1.0000 | ORAL_TABLET | ORAL | 0 refills | Status: DC | PRN

## 2016-10-07 MED ORDER — PREDNISONE 10 MG PO TABS: 20.0000 mg | ORAL_TABLET | Freq: Every day | ORAL | 0 refills | Status: DC

## 2016-10-07 MED ORDER — HYDROCODONE-ACETAMINOPHEN 5-325 MG PO TABS
2.0000 | ORAL_TABLET | Freq: Once | ORAL | Status: AC
  Administered 2016-10-07: 2 via ORAL
  Filled 2016-10-07: qty 2

## 2016-10-07 MED ORDER — PREDNISONE 20 MG PO TABS
40.0000 mg | ORAL_TABLET | Freq: Once | ORAL | Status: AC
  Administered 2016-10-07: 40 mg via ORAL
  Filled 2016-10-07: qty 2

## 2016-10-07 NOTE — ED Triage Notes (Signed)
PT c/o pain to right foot with discoloration and pain around the heel x1 day. PT states amputation of 1st and 2nd toe on right foot x20 years ago post a lawnmower accident.

## 2016-10-07 NOTE — Discharge Instructions (Signed)
X-ray showed no fractured bones. Prescription for pain medicine and prednisone. Recommend ankle and foot brace if symptoms do not improve. Ice pack.

## 2016-10-08 NOTE — ED Provider Notes (Signed)
[facility] DEPT Provider Note   CSN: [id number] Arrival date & time: 10/07/16  1805     History   Chief Complaint Chief Complaint  Patient presents with  . Foot Pain    HPI [name] is a 51 y.o. male.  Patient complains of pain in his right foot for 1 day. No trauma. No fever, sweats, chills. No history of gout. Status post amputation of right first and second toe many years ago in a lawnmower accident. Severity of pain is moderate.      Past Medical History:  Diagnosis Date  . Anaphylaxis    IGE mediated  . Anxiety   . Arthritis   . Cardiomyopathy ([place])   . COPD (chronic obstructive pulmonary disease) ([place])   . Coronary atherosclerosis of native coronary artery    Multivessel status post CABG 2010  . Depression   . Essential hypertension, benign   . History of CVA (cerebrovascular accident) 01/2012   Right posterior frontal cortical and subcortical brain by MRI, no hemorrhage. Carotid Dopplers showed only 1-50% bilateral ICA stenoses. Echocardiogram showed LVEF 50%, no major valvular abnormalities.  . Mixed hyperlipidemia   . Myocardial infarction 2010  . OSA (obstructive sleep apnea)   . Type 2 diabetes mellitus [facility])     Patient Active Problem List   Diagnosis Date Noted  . NSTEMI (non-ST elevated myocardial infarction) ([place]) 04/28/2014  . Secondary cardiomyopathy ([place]) 12/31/2013  . History of noncompliance with medical treatment 10/31/2013  . Obesity (BMI 30-39.9) 10/31/2013  . Sleep apnea 10/31/2013  . Anxiety   . COPD (chronic obstructive pulmonary disease) ([place])   . History of stroke 02/07/2012  . Tobacco abuse   . Essential hypertension, benign 08/16/2008  . Hyperlipidemia   . Coronary atherosclerosis of native coronary artery     Past Surgical History:  Procedure Laterality Date  . CHOLECYSTECTOMY    . CORONARY ARTERY BYPASS GRAFT  2010   LIMA to LAD, SVG to diagonal, SVG to OM1 and OM 2, SVG to RCA  . DENTAL SURGERY  2003  .  GASTRIC BYPASS  2010  . HERNIA REPAIR  2011, 2012  . INCISIONAL HERNIA REPAIR N/A 07/23/2013   Procedure: HERNIA REPAIR INCISIONAL WITH MESH;  Surgeon: Jamesetta So, MD;  Location: [facility] ORS;  Service: General;  Laterality: N/A;  . INSERTION OF MESH N/A 07/23/2013   Procedure: INSERTION OF MESH;  Surgeon: Jamesetta So, MD;  Location: [facility] ORS;  Service: General;  Laterality: N/A;  . LEFT HEART CATHETERIZATION WITH CORONARY/GRAFT ANGIOGRAM N/A 11/01/2013   Procedure: LEFT HEART CATHETERIZATION WITH Beatrix Fetters;  Surgeon: Blane Ohara, MD;  Location: [facility] CATH LAB;  Service: Cardiovascular;  Laterality: N/A;  . TOE AMPUTATION  1998   right 1st and 2nd toe  . TONSILECTOMY, ADENOIDECTOMY, BILATERAL MYRINGOTOMY AND TUBES    . TONSILLECTOMY    . VENTRAL HERNIA REPAIR N/A 10/28/2012   Procedure: LAPAROSCOPIC VENTRAL HERNIA;  Surgeon: Donato Heinz, MD;  Location: [facility] ORS;  Service: General;  Laterality: N/A;       Home Medications    Prior to Admission medications   Medication Sig Start Date End Date Taking? Authorizing Provider  albuterol (PROVENTIL HFA;VENTOLIN HFA) 108 (90 BASE) MCG/ACT inhaler Inhale 2 puffs into the lungs every 6 (six) hours as needed for wheezing or shortness of breath.   Yes Historical Provider, MD  ALPRAZolam Duanne Moron) 1 MG tablet Take 1 mg by mouth 4 (four) times daily as needed for anxiety.  Yes Historical Provider, MD  amLODipine (NORVASC) 10 MG tablet Take 10 mg by mouth daily.   Yes Historical Provider, MD  aspirin EC 325 MG tablet Take 325 mg by mouth daily.   Yes Historical Provider, MD  carvedilol (COREG) 25 MG tablet Take 1 tablet (25 mg total) by mouth 2 (two) times daily with a meal. 11/24/13  Yes Lendon Colonel, NP  clopidogrel (PLAVIX) 75 MG tablet TAKE (1) TABLET BY MOUTH ONCE DAILY. 04/16/16  Yes Satira Sark, MD  dexamethasone (DECADRON) 4 MG tablet Take 1 tablet (4 mg total) by mouth 2 (two) times daily with a meal. 08/03/16  Yes Lily Kocher, PA-C  fluticasone (CUTIVATE) 0.05 % cream Apply 1 application topically 2 (two) times daily.   Yes Historical Provider, MD  hydrALAZINE (APRESOLINE) 25 MG tablet Take 1 tablet (25 mg total) by mouth 2 (two) times daily. 04/24/16 10/07/16 Yes Satira Sark, MD  isosorbide mononitrate (IMDUR) 60 MG 24 hr tablet Take 60 mg by mouth 2 (two) times daily.    Yes Historical Provider, MD  losartan (COZAAR) 100 MG tablet TAKE (1) TABLET BY MOUTH ONCE DAILY. 02/08/16  Yes Satira Sark, MD  rosuvastatin (CRESTOR) 40 MG tablet Take 40 mg by mouth daily.   Yes Historical Provider, MD  spironolactone (ALDACTONE) 25 MG tablet Take 1 tablet (25 mg total) by mouth daily. 05/04/14  Yes Satira Sark, MD  traMADol (ULTRAM) 50 MG tablet Take 50-100 mg by mouth 4 (four) times daily. For pain   Yes Historical Provider, MD  HYDROcodone-acetaminophen (NORCO/VICODIN) 5-325 MG tablet Take 1-2 tablets by mouth every 4 (four) hours as needed. 10/07/16   Nat Christen, MD  nitroGLYCERIN (NITROSTAT) 0.4 MG SL tablet Place 0.4 mg under the tongue every 5 (five) minutes as needed. For chest pain    Historical Provider, MD  predniSONE (DELTASONE) 10 MG tablet Take 2 tablets (20 mg total) by mouth daily. 10/07/16   Nat Christen, MD    Family History Family History  Problem Relation Age of Onset  . Lung cancer Father   . Breast cancer Maternal Aunt     Social History Social History  Substance Use Topics  . Smoking status: Current Some Day Smoker    Packs/day: 0.75    Years: 30.00    Types: Cigarettes    Start date: 06/08/1982    Last attempt to quit: 09/28/2014  . Smokeless tobacco: Never Used     Comment: 1 pack every 1 1/2 days   . Alcohol use No     Comment: rarely     Allergies   Contrast media [iodinated diagnostic agents]; Ibuprofen; and Nsaids   Review of Systems Review of Systems  All other systems reviewed and are negative.    Physical Exam Updated Vital Signs BP (!) 166/80   Pulse  71   Temp 97.8 F (36.6 C) (Oral)   Resp 20   Ht 5\' 11"  (1.803 m)   Wt 257 lb (116.6 kg)   SpO2 99%   BMI 35.84 kg/m   Physical Exam  Constitutional: He is oriented to person, place, and time. He appears well-developed and well-nourished.  HENT:  Head: Normocephalic and atraumatic.  Eyes: Conjunctivae are normal.  Neck: Neck supple.  Abdominal: Soft.  Musculoskeletal:  Right foot: Minimal tenderness in the medial and lateral aspect of the calcaneus. No obvious redness or swelling.  Neurological: He is alert and oriented to person, place, and time.  Skin: Skin  is warm and dry.  Psychiatric: He has a normal mood and affect. His behavior is normal.  Nursing note and vitals reviewed.    ED Treatments / Results  Labs (all labs ordered are listed, but only abnormal results are displayed) Labs Reviewed - No data to display  EKG  EKG Interpretation None       Radiology Dg Foot Complete Right  Result Date: 10/07/2016 CLINICAL DATA:  Pain and redness EXAM: RIGHT FOOT COMPLETE - 3+ VIEW COMPARISON:  None. FINDINGS: There is no evidence of fracture or dislocation. There is amputation of the first and second phalanges. There is no bone destruction or periosteal reaction. There is a small plantar calcaneal spur. There are enthesopathic changes of the Achilles tendon insertion. 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 Soft tissues are unremarkable. IMPRESSION: No osteomyelitis of the right foot. Electronically Signed   By: Kathreen Devoid   On: 10/07/2016 18:58    Procedures Procedures (including critical care time)  Medications Ordered in ED Medications  HYDROcodone-acetaminophen (NORCO/VICODIN) 5-325 MG per tablet 2 tablet (2 tablets Oral Given 10/07/16 2334)  predniSONE (DELTASONE) tablet 40 mg (40 mg Oral Given 10/07/16 2334)     Initial Impression / Assessment and Plan / ED Course  I have reviewed the triage vital signs and the nursing notes.  Pertinent labs &  imaging results that were available during my care of the patient were reviewed by me and considered in my medical decision making (see chart for details).     No apparent etiology for right foot pain. X-ray negative. Will Rx Vicodin and prednisone.  Final Clinical Impressions(s) / ED Diagnoses   Final diagnoses:  Right foot pain    New Prescriptions Discharge Medication List as of 10/07/2016 11:24 PM    START taking these medications   Details  HYDROcodone-acetaminophen (NORCO/VICODIN) 5-325 MG tablet Take 1-2 tablets by mouth every 4 (four) hours as needed., Starting Mon 10/07/2016, Print    predniSONE (DELTASONE) 10 MG tablet Take 2 tablets (20 mg total) by mouth daily., Starting Mon 10/07/2016, Print         Nat Christen, MD 10/08/16 1426

## 2016-11-13 ENCOUNTER — Other Ambulatory Visit: Payer: Self-pay | Admitting: Cardiology

## 2016-12-13 ENCOUNTER — Other Ambulatory Visit: Payer: Self-pay | Admitting: Cardiology

## 2016-12-18 DIAGNOSIS — E119 Type 2 diabetes mellitus without complications: Secondary | ICD-10-CM | POA: Diagnosis not present

## 2016-12-18 DIAGNOSIS — I2510 Atherosclerotic heart disease of native coronary artery without angina pectoris: Secondary | ICD-10-CM | POA: Diagnosis not present

## 2016-12-18 DIAGNOSIS — F17210 Nicotine dependence, cigarettes, uncomplicated: Secondary | ICD-10-CM | POA: Diagnosis not present

## 2016-12-18 DIAGNOSIS — I10 Essential (primary) hypertension: Secondary | ICD-10-CM | POA: Diagnosis not present

## 2016-12-18 DIAGNOSIS — Z1389 Encounter for screening for other disorder: Secondary | ICD-10-CM | POA: Diagnosis not present

## 2016-12-18 DIAGNOSIS — Z6836 Body mass index (BMI) 36.0-36.9, adult: Secondary | ICD-10-CM | POA: Diagnosis not present

## 2016-12-18 DIAGNOSIS — E8881 Metabolic syndrome: Secondary | ICD-10-CM | POA: Diagnosis not present

## 2016-12-18 DIAGNOSIS — E782 Mixed hyperlipidemia: Secondary | ICD-10-CM | POA: Diagnosis not present

## 2016-12-26 DIAGNOSIS — L02416 Cutaneous abscess of left lower limb: Secondary | ICD-10-CM | POA: Diagnosis not present

## 2016-12-26 DIAGNOSIS — F17200 Nicotine dependence, unspecified, uncomplicated: Secondary | ICD-10-CM | POA: Diagnosis not present

## 2016-12-26 DIAGNOSIS — Z7902 Long term (current) use of antithrombotics/antiplatelets: Secondary | ICD-10-CM | POA: Diagnosis not present

## 2016-12-26 DIAGNOSIS — Z7982 Long term (current) use of aspirin: Secondary | ICD-10-CM | POA: Diagnosis not present

## 2016-12-26 DIAGNOSIS — I10 Essential (primary) hypertension: Secondary | ICD-10-CM | POA: Diagnosis not present

## 2016-12-26 DIAGNOSIS — F329 Major depressive disorder, single episode, unspecified: Secondary | ICD-10-CM | POA: Diagnosis not present

## 2016-12-26 DIAGNOSIS — Z79899 Other long term (current) drug therapy: Secondary | ICD-10-CM | POA: Diagnosis not present

## 2016-12-26 DIAGNOSIS — E119 Type 2 diabetes mellitus without complications: Secondary | ICD-10-CM | POA: Diagnosis not present

## 2016-12-26 DIAGNOSIS — I2510 Atherosclerotic heart disease of native coronary artery without angina pectoris: Secondary | ICD-10-CM | POA: Diagnosis not present

## 2017-02-03 ENCOUNTER — Ambulatory Visit: Payer: Medicare Other | Admitting: Cardiology

## 2017-02-03 NOTE — Progress Notes (Signed)
Cardiology Office Note  Date: 02/04/2017   [name], DOB [birth year], MRN [id number]  PCP: [name] Bis, MD  Primary Cardiologist: [name] Lesches, MD   Chief Complaint  Patient presents with  . Coronary Artery Disease    History of Present Illness: [name] is a medically complex 51 y.o. male last seen in January. He presents for a routine follow-up visit. Reports no angina symptoms or nitroglycerin use. He states that he has been trying to work on losing weight, has lost about 10 pounds. He is working on torsion control and carbohydrate reduction. We talked about trying to increase his activity somewhat as well. He has been talking with Dr. Quillian [name] about a diet pill.  I reviewed his medications which are stable from a cardiac perspective. He reports compliance. Blood pressure remains elevated. We discussed increasing his hydralazine dose. Hopefully with further weight loss we might get into the point that he could cut back some of his antihypertensives.  He continues to follow lab work regularly with Dr. Quillian [name]. Remains on high-dose statin therapy without side effects.  Past Medical History:  Diagnosis Date  . Anaphylaxis    IGE mediated  . Anxiety   . Arthritis   . Cardiomyopathy ([place])   . COPD (chronic obstructive pulmonary disease) ([place])   . Coronary atherosclerosis of native coronary artery    Multivessel status post CABG 2010  . Depression   . Essential hypertension, benign   . History of CVA (cerebrovascular accident) 01/2012   Right posterior frontal cortical and subcortical brain by MRI, no hemorrhage. Carotid Dopplers showed only 1-50% bilateral ICA stenoses. Echocardiogram showed LVEF 50%, no major valvular abnormalities.  . Mixed hyperlipidemia   . Myocardial infarction ([place]) 2010  . OSA (obstructive sleep apnea)   . Type 2 diabetes mellitus ([place])     Past Surgical History:  Procedure Laterality Date  . CHOLECYSTECTOMY    .  CORONARY ARTERY BYPASS GRAFT  2010   LIMA to LAD, SVG to diagonal, SVG to OM1 and OM 2, SVG to RCA  . DENTAL SURGERY  2003  . GASTRIC BYPASS  2010  . HERNIA REPAIR  2011, 2012  . INCISIONAL HERNIA REPAIR N/A 07/23/2013   Procedure: HERNIA REPAIR INCISIONAL WITH MESH;  Surgeon: [name] So, MD;  Location: [facility];  Service: General;  Laterality: N/A;  . INSERTION OF MESH N/A 07/23/2013   Procedure: INSERTION OF MESH;  Surgeon: [name] So, MD;  Location: [facility];  Service: General;  Laterality: N/A;  . LEFT HEART CATHETERIZATION WITH CORONARY/GRAFT ANGIOGRAM N/A 11/01/2013   Procedure: LEFT HEART CATHETERIZATION WITH [name];  Surgeon: [name] Ohara, MD;  Location: [facility] CATH LAB;  Service: Cardiovascular;  Laterality: N/A;  . TOE AMPUTATION  1998   right 1st and 2nd toe  . TONSILECTOMY, ADENOIDECTOMY, BILATERAL MYRINGOTOMY AND TUBES    . TONSILLECTOMY    . VENTRAL HERNIA REPAIR N/A 10/28/2012   Procedure: LAPAROSCOPIC VENTRAL HERNIA;  Surgeon: [name] Heinz, MD;  Location: [facility];  Service: General;  Laterality: N/A;    Current Outpatient Prescriptions  Medication Sig Dispense Refill  . albuterol (PROVENTIL HFA;VENTOLIN HFA) 108 (90 BASE) MCG/ACT inhaler Inhale 2 puffs into the lungs every 6 (six) hours as needed for wheezing or shortness of breath.    . ALPRAZolam (XANAX) 1 MG tablet Take 1 mg by mouth 4 (four) times daily as needed for anxiety.     [name] amLODipine (NORVASC) 10 MG  tablet Take 10 mg by mouth daily.    [name] aspirin EC 325 MG tablet Take 325 mg by mouth daily.    . carvedilol (COREG) 25 MG tablet Take 1 tablet (25 mg total) by mouth 2 (two) times daily with a meal. 60 tablet 6  . clopidogrel (PLAVIX) 75 MG tablet TAKE 1 TABLET ONCE DAILY. 30 tablet 6  . dexamethasone (DECADRON) 4 MG tablet Take 1 tablet (4 mg total) by mouth 2 (two) times daily with a meal. 12 tablet 0  . fluticasone (CUTIVATE) 0.05 % cream Apply 1 application topically 2 (two) times daily.    [name]  HYDROcodone-acetaminophen (NORCO/VICODIN) 5-325 MG tablet Take 1-2 tablets by mouth every 4 (four) hours as needed. 20 tablet 0  . isosorbide mononitrate (IMDUR) 60 MG 24 hr tablet Take 60 mg by mouth 2 (two) times daily.     [name] losartan (COZAAR) 100 MG tablet TAKE (1) TABLET BY MOUTH ONCE DAILY. 30 tablet 0  . nitroGLYCERIN (NITROSTAT) 0.4 MG SL tablet Place 0.4 mg under the tongue every 5 (five) minutes as needed. For chest pain    . predniSONE (DELTASONE) 10 MG tablet Take 2 tablets (20 mg total) by mouth daily. 15 tablet 0  . rosuvastatin (CRESTOR) 40 MG tablet Take 40 mg by mouth daily.    [name] spironolactone (ALDACTONE) 25 MG tablet Take 1 tablet (25 mg total) by mouth daily. 90 tablet 3  . traMADol (ULTRAM) 50 MG tablet Take 50-100 mg by mouth 4 (four) times daily. For pain    . hydrALAZINE (APRESOLINE) 50 MG tablet Take 1 tablet (50 mg total) by mouth 2 (two) times daily. 180 tablet 3   No current facility-administered medications for this visit.    Allergies:  Contrast media [iodinated diagnostic agents]; Ibuprofen; and Nsaids   Social History: The patient  reports that he has been smoking Cigarettes.  He started smoking about 34 years ago. He has a 22.50 pack-year smoking history. He has never used smokeless tobacco. He reports that he does not drink alcohol or use drugs.   ROS:  Please see the history of present illness. Otherwise, complete review of systems is positive for chronic dyspnea exertion.  All other systems are reviewed and negative.   Physical Exam: VS:  BP (!) 188/78   Pulse 81   Ht 5\' 11"  (1.803 m)   Wt 261 lb 8 oz (118.6 kg)   BMI 36.47 kg/m , BMI Body mass index is 36.47 kg/m.  Wt Readings from Last 3 Encounters:  02/04/17 261 lb 8 oz (118.6 kg)  10/07/16 257 lb (116.6 kg)  08/02/16 268 lb (121.6 kg)    General: Obese male, appears comfortable at rest. HEENT: Conjunctiva and lids normal, oropharynx clear. Neck: Supple, no elevated JVP or carotid bruits, no  thyromegaly. Lungs: Clear to auscultation, nonlabored breathing at rest. Cardiac: Regular rate and rhythm, S4, no significant systolic murmur, no pericardial rub. Abdomen: Soft, nontender, bowel sounds present, no guarding or rebound. Extremities: No pitting edema, distal pulses 1-2+. Statuspost right first and second toe agitations. Skin: Warm and dry. Musculoskeletal: No kyphosis. Neuropsychiatric: Alert and oriented x3, affect grossly appropriate.  ECG: I personally reviewed the tracing from 04/23/2016 which showed sinus rhythm with left atrial enlargement, increased voltage, and old anterior infarct pattern.  Recent Labwork:  August 2016: Cholesterol 195, triglycerides 191, HDL 29, LDL 128, BUN 19, creatinine 1.0, potassium 4.3, AST 17, ALT 26   Other Studies Reviewed Today:  Echocardiogram 01/20/2014: Study  Conclusions  - Procedure narrative: Transthoracic echocardiography. Image quality was poor. The study was technically difficult, as a result of poor sound wave transmission and body habitus. Intravenous contrast (Definity) was administered. - Left ventricle: The cavity size was mildly dilated. Systolic function was low normal to mildly reduced. The estimated ejection fraction was in the range of 45% to 50%. Features are consistent with a pseudonormal left ventricular filling pattern, with concomitant abnormal relaxation and increased filling pressure (grade 2 diastolic dysfunction). Mildly elevated filling pressures. Mild posterior wall thickening. - Regional wall motion abnormality: Moderate hypokinesis of the mid-apical anterior, mid anteroseptal, mid anterolateral, apical septal, apical lateral, and apical myocardium. - Aortic valve: There was mild regurgitation. - Left atrium: The atrium was mildly dilated.  Cardiac catheterization 11/01/2013: Coronary angiography: Coronary dominance:right  Left mainstem:The left main is patent without  obstructive disease   Left anterior descending (LAD):The LAD is totally occluded at the proximal portion.  Left circumflex (LCx):The left circumflex is severely diseased with 80-90% proximal vessel stenosis. The obtuse marginal branches are all occluded. The left PLA branches which are very small, are patent.  Right coronary artery (RCA):The native RCA is totally occluded.  LIMA to LAD: Widely patent throughout. The native LAD is diffusely diseased but patent throughout its course. The vessel retrograde fills back through the first diagonal. The LAD wraps around the left ventricular apex without significant disease.  Saphenous vein graft to first diagonal: Widely patent without significant obstruction.  Saphenous vein graft sequential to OM1 and OM 2 is patent without significant disease. There are some areas of dilatation. The vein is particularly dilated between the OM1 and OM 2 sequence.  Saphenous vein graft to PDA: Patent throughout with no obstructive disease.  Left ventriculography: There is severe hypokinesis of the anterolateral and periapical walls. The basal anterior and basal and midinferior walls contract normally. The estimated LVEF is 35%.  Final Conclusions:  1. Severe native three-vessel coronary artery disease with total occlusion of the RCA, total occlusion of the LAD, and total occlusion of the obtuse marginal branch of the left circumflex  2. Status post aortocoronary bypass surgery with continued patency of the LIMA to LAD, saphenous vein graft to first diagonal, sequential saphenous vein graft to OM1 and OM 2, and saphenous vein graft to the right PDA  3. Severe segmental LV systolic dysfunction  Assessment and Plan:  1. Multivessel CAD status post CABG with patent bypass grafts as of 2015. He reports no active angina symptoms on current medical regimen. Continue with observation.  2. Essential hypertension. Continue with current medications,  increase hydralazine to 50 g twice daily.  3. Obesity. Discussed diet and exercise plan.  4. Hyperlipidemia, remains on high-dose Crestor without side effects.  Current medicines were reviewed with the patient today.  Disposition: Follow-up in 6 months.  Signed, Satira Sark, MD, [facility] 02/04/2017 10:33 AM    [facility] [place], [place], [place] [address] Phone: [phone]; Fax: [phone]

## 2017-02-04 ENCOUNTER — Encounter: Payer: Self-pay | Admitting: Cardiology

## 2017-02-04 ENCOUNTER — Ambulatory Visit (INDEPENDENT_AMBULATORY_CARE_PROVIDER_SITE_OTHER): Payer: Medicare Other | Admitting: Cardiology

## 2017-02-04 VITALS — BP 188/78 | HR 81 | Ht 71.0 in | Wt 261.5 lb

## 2017-02-04 DIAGNOSIS — I25119 Atherosclerotic heart disease of native coronary artery with unspecified angina pectoris: Secondary | ICD-10-CM

## 2017-02-04 DIAGNOSIS — E782 Mixed hyperlipidemia: Secondary | ICD-10-CM | POA: Diagnosis not present

## 2017-02-04 DIAGNOSIS — I2510 Atherosclerotic heart disease of native coronary artery without angina pectoris: Secondary | ICD-10-CM

## 2017-02-04 DIAGNOSIS — I209 Angina pectoris, unspecified: Secondary | ICD-10-CM

## 2017-02-04 DIAGNOSIS — I10 Essential (primary) hypertension: Secondary | ICD-10-CM

## 2017-02-04 MED ORDER — HYDRALAZINE HCL 50 MG PO TABS: 50.0000 mg | ORAL_TABLET | Freq: Two times a day (BID) | ORAL | 3 refills | Status: DC

## 2017-02-04 NOTE — Patient Instructions (Signed)
Medication Instructions:   Increase Hydralazine to 50 mg twice daily  Please continue all other medications as prescribed  Labwork: NONE  Testing/Procedures: NONE  Follow-Up: Your physician wants you to follow-up in: [place]. You will receive a reminder letter in the mail two months in advance. If you don't receive a letter, please call our office to schedule the follow-up appointment.  Any Other Special Instructions Will Be Listed Below (If Applicable).  If you need a refill on your cardiac medications before your next appointment, please call your pharmacy.

## 2017-02-26 DIAGNOSIS — Z79899 Other long term (current) drug therapy: Secondary | ICD-10-CM | POA: Diagnosis not present

## 2017-02-26 DIAGNOSIS — F329 Major depressive disorder, single episode, unspecified: Secondary | ICD-10-CM | POA: Diagnosis not present

## 2017-02-26 DIAGNOSIS — F17200 Nicotine dependence, unspecified, uncomplicated: Secondary | ICD-10-CM | POA: Diagnosis not present

## 2017-02-26 DIAGNOSIS — I10 Essential (primary) hypertension: Secondary | ICD-10-CM | POA: Diagnosis not present

## 2017-02-26 DIAGNOSIS — M7622 Iliac crest spur, left hip: Secondary | ICD-10-CM | POA: Diagnosis not present

## 2017-02-26 DIAGNOSIS — M1990 Unspecified osteoarthritis, unspecified site: Secondary | ICD-10-CM | POA: Diagnosis not present

## 2017-02-26 DIAGNOSIS — Z7902 Long term (current) use of antithrombotics/antiplatelets: Secondary | ICD-10-CM | POA: Diagnosis not present

## 2017-02-26 DIAGNOSIS — I2510 Atherosclerotic heart disease of native coronary artery without angina pectoris: Secondary | ICD-10-CM | POA: Diagnosis not present

## 2017-02-26 DIAGNOSIS — Z7982 Long term (current) use of aspirin: Secondary | ICD-10-CM | POA: Diagnosis not present

## 2017-02-26 DIAGNOSIS — M76892 Other specified enthesopathies of left lower limb, excluding foot: Secondary | ICD-10-CM | POA: Diagnosis not present

## 2017-02-26 DIAGNOSIS — M25552 Pain in left hip: Secondary | ICD-10-CM | POA: Diagnosis not present

## 2017-02-26 DIAGNOSIS — E119 Type 2 diabetes mellitus without complications: Secondary | ICD-10-CM | POA: Diagnosis not present

## 2017-03-13 DIAGNOSIS — M25552 Pain in left hip: Secondary | ICD-10-CM | POA: Diagnosis not present

## 2017-03-13 DIAGNOSIS — M1991 Primary osteoarthritis, unspecified site: Secondary | ICD-10-CM | POA: Diagnosis not present

## 2017-03-13 DIAGNOSIS — M545 Low back pain: Secondary | ICD-10-CM | POA: Diagnosis not present

## 2017-03-31 DIAGNOSIS — M25552 Pain in left hip: Secondary | ICD-10-CM | POA: Diagnosis not present

## 2017-03-31 DIAGNOSIS — M545 Low back pain: Secondary | ICD-10-CM | POA: Diagnosis not present

## 2017-04-02 DIAGNOSIS — I25111 Atherosclerotic heart disease of native coronary artery with angina pectoris with documented spasm: Secondary | ICD-10-CM | POA: Diagnosis not present

## 2017-04-02 DIAGNOSIS — Z9189 Other specified personal risk factors, not elsewhere classified: Secondary | ICD-10-CM | POA: Diagnosis not present

## 2017-04-02 DIAGNOSIS — F331 Major depressive disorder, recurrent, moderate: Secondary | ICD-10-CM | POA: Diagnosis not present

## 2017-04-02 DIAGNOSIS — E1165 Type 2 diabetes mellitus with hyperglycemia: Secondary | ICD-10-CM | POA: Diagnosis not present

## 2017-04-02 DIAGNOSIS — E782 Mixed hyperlipidemia: Secondary | ICD-10-CM | POA: Diagnosis not present

## 2017-04-02 DIAGNOSIS — F17210 Nicotine dependence, cigarettes, uncomplicated: Secondary | ICD-10-CM | POA: Diagnosis not present

## 2017-04-02 DIAGNOSIS — E119 Type 2 diabetes mellitus without complications: Secondary | ICD-10-CM | POA: Diagnosis not present

## 2017-04-02 DIAGNOSIS — K210 Gastro-esophageal reflux disease with esophagitis: Secondary | ICD-10-CM | POA: Diagnosis not present

## 2017-04-02 DIAGNOSIS — I10 Essential (primary) hypertension: Secondary | ICD-10-CM | POA: Diagnosis not present

## 2017-04-03 DIAGNOSIS — M545 Low back pain: Secondary | ICD-10-CM | POA: Diagnosis not present

## 2017-04-03 DIAGNOSIS — M25552 Pain in left hip: Secondary | ICD-10-CM | POA: Diagnosis not present

## 2017-04-07 DIAGNOSIS — G4733 Obstructive sleep apnea (adult) (pediatric): Secondary | ICD-10-CM | POA: Diagnosis not present

## 2017-04-07 DIAGNOSIS — E6609 Other obesity due to excess calories: Secondary | ICD-10-CM | POA: Diagnosis not present

## 2017-04-07 DIAGNOSIS — F17210 Nicotine dependence, cigarettes, uncomplicated: Secondary | ICD-10-CM | POA: Diagnosis not present

## 2017-04-07 DIAGNOSIS — E8881 Metabolic syndrome: Secondary | ICD-10-CM | POA: Diagnosis not present

## 2017-04-07 DIAGNOSIS — E119 Type 2 diabetes mellitus without complications: Secondary | ICD-10-CM | POA: Diagnosis not present

## 2017-04-07 DIAGNOSIS — I10 Essential (primary) hypertension: Secondary | ICD-10-CM | POA: Diagnosis not present

## 2017-04-07 DIAGNOSIS — Z0001 Encounter for general adult medical examination with abnormal findings: Secondary | ICD-10-CM | POA: Diagnosis not present

## 2017-04-07 DIAGNOSIS — F331 Major depressive disorder, recurrent, moderate: Secondary | ICD-10-CM | POA: Diagnosis not present

## 2017-04-07 DIAGNOSIS — G6289 Other specified polyneuropathies: Secondary | ICD-10-CM | POA: Diagnosis not present

## 2017-04-07 DIAGNOSIS — I2510 Atherosclerotic heart disease of native coronary artery without angina pectoris: Secondary | ICD-10-CM | POA: Diagnosis not present

## 2017-04-07 DIAGNOSIS — E782 Mixed hyperlipidemia: Secondary | ICD-10-CM | POA: Diagnosis not present

## 2017-04-07 DIAGNOSIS — J4530 Mild persistent asthma, uncomplicated: Secondary | ICD-10-CM | POA: Diagnosis not present

## 2017-04-10 DIAGNOSIS — M545 Low back pain: Secondary | ICD-10-CM | POA: Diagnosis not present

## 2017-04-10 DIAGNOSIS — M25552 Pain in left hip: Secondary | ICD-10-CM | POA: Diagnosis not present

## 2017-04-14 DIAGNOSIS — M545 Low back pain: Secondary | ICD-10-CM | POA: Diagnosis not present

## 2017-04-14 DIAGNOSIS — M25552 Pain in left hip: Secondary | ICD-10-CM | POA: Diagnosis not present

## 2017-04-17 DIAGNOSIS — M25552 Pain in left hip: Secondary | ICD-10-CM | POA: Diagnosis not present

## 2017-04-17 DIAGNOSIS — M545 Low back pain: Secondary | ICD-10-CM | POA: Diagnosis not present

## 2017-04-18 DIAGNOSIS — L723 Sebaceous cyst: Secondary | ICD-10-CM | POA: Diagnosis not present

## 2017-04-21 DIAGNOSIS — M25552 Pain in left hip: Secondary | ICD-10-CM | POA: Diagnosis not present

## 2017-04-21 DIAGNOSIS — M545 Low back pain: Secondary | ICD-10-CM | POA: Diagnosis not present

## 2017-04-24 DIAGNOSIS — M25552 Pain in left hip: Secondary | ICD-10-CM | POA: Diagnosis not present

## 2017-04-24 DIAGNOSIS — M545 Low back pain: Secondary | ICD-10-CM | POA: Diagnosis not present

## 2017-04-28 DIAGNOSIS — M545 Low back pain: Secondary | ICD-10-CM | POA: Diagnosis not present

## 2017-04-28 DIAGNOSIS — M25552 Pain in left hip: Secondary | ICD-10-CM | POA: Diagnosis not present

## 2017-05-01 DIAGNOSIS — M25552 Pain in left hip: Secondary | ICD-10-CM | POA: Diagnosis not present

## 2017-05-01 DIAGNOSIS — M545 Low back pain: Secondary | ICD-10-CM | POA: Diagnosis not present

## 2017-05-05 DIAGNOSIS — M25552 Pain in left hip: Secondary | ICD-10-CM | POA: Diagnosis not present

## 2017-05-05 DIAGNOSIS — M545 Low back pain: Secondary | ICD-10-CM | POA: Diagnosis not present

## 2017-05-12 DIAGNOSIS — M545 Low back pain: Secondary | ICD-10-CM | POA: Diagnosis not present

## 2017-05-12 DIAGNOSIS — M25552 Pain in left hip: Secondary | ICD-10-CM | POA: Diagnosis not present

## 2017-05-17 DIAGNOSIS — G43019 Migraine without aura, intractable, without status migrainosus: Secondary | ICD-10-CM | POA: Diagnosis not present

## 2017-05-22 DIAGNOSIS — G43019 Migraine without aura, intractable, without status migrainosus: Secondary | ICD-10-CM | POA: Diagnosis not present

## 2017-05-22 DIAGNOSIS — G459 Transient cerebral ischemic attack, unspecified: Secondary | ICD-10-CM | POA: Diagnosis not present

## 2017-05-22 DIAGNOSIS — J342 Deviated nasal septum: Secondary | ICD-10-CM | POA: Diagnosis not present

## 2017-05-22 DIAGNOSIS — Z8673 Personal history of transient ischemic attack (TIA), and cerebral infarction without residual deficits: Secondary | ICD-10-CM | POA: Diagnosis not present

## 2017-05-22 DIAGNOSIS — R42 Dizziness and giddiness: Secondary | ICD-10-CM | POA: Diagnosis not present

## 2017-05-22 DIAGNOSIS — J328 Other chronic sinusitis: Secondary | ICD-10-CM | POA: Diagnosis not present

## 2017-05-29 DIAGNOSIS — M5021 Other cervical disc displacement,  high cervical region: Secondary | ICD-10-CM | POA: Diagnosis not present

## 2017-05-29 DIAGNOSIS — R221 Localized swelling, mass and lump, neck: Secondary | ICD-10-CM | POA: Diagnosis not present

## 2017-05-29 DIAGNOSIS — D110 Benign neoplasm of parotid gland: Secondary | ICD-10-CM | POA: Diagnosis not present

## 2017-06-11 DIAGNOSIS — R05 Cough: Secondary | ICD-10-CM | POA: Diagnosis not present

## 2017-06-11 DIAGNOSIS — J019 Acute sinusitis, unspecified: Secondary | ICD-10-CM | POA: Diagnosis not present

## 2017-06-14 DIAGNOSIS — Z720 Tobacco use: Secondary | ICD-10-CM | POA: Diagnosis not present

## 2017-06-14 DIAGNOSIS — I517 Cardiomegaly: Secondary | ICD-10-CM | POA: Diagnosis not present

## 2017-06-14 DIAGNOSIS — R4781 Slurred speech: Secondary | ICD-10-CM | POA: Diagnosis not present

## 2017-06-14 DIAGNOSIS — J449 Chronic obstructive pulmonary disease, unspecified: Secondary | ICD-10-CM | POA: Diagnosis present

## 2017-06-14 DIAGNOSIS — Z9282 Status post administration of tPA (rtPA) in a different facility within the last 24 hours prior to admission to current facility: Secondary | ICD-10-CM | POA: Insufficient documentation

## 2017-06-14 DIAGNOSIS — Z7982 Long term (current) use of aspirin: Secondary | ICD-10-CM | POA: Diagnosis not present

## 2017-06-14 DIAGNOSIS — I255 Ischemic cardiomyopathy: Secondary | ICD-10-CM | POA: Diagnosis present

## 2017-06-14 DIAGNOSIS — F17210 Nicotine dependence, cigarettes, uncomplicated: Secondary | ICD-10-CM | POA: Diagnosis present

## 2017-06-14 DIAGNOSIS — I519 Heart disease, unspecified: Secondary | ICD-10-CM | POA: Diagnosis not present

## 2017-06-14 DIAGNOSIS — E1165 Type 2 diabetes mellitus with hyperglycemia: Secondary | ICD-10-CM | POA: Diagnosis not present

## 2017-06-14 DIAGNOSIS — I2510 Atherosclerotic heart disease of native coronary artery without angina pectoris: Secondary | ICD-10-CM | POA: Diagnosis not present

## 2017-06-14 DIAGNOSIS — R471 Dysarthria and anarthria: Secondary | ICD-10-CM | POA: Diagnosis not present

## 2017-06-14 DIAGNOSIS — I63311 Cerebral infarction due to thrombosis of right middle cerebral artery: Secondary | ICD-10-CM | POA: Insufficient documentation

## 2017-06-14 DIAGNOSIS — D72829 Elevated white blood cell count, unspecified: Secondary | ICD-10-CM | POA: Diagnosis not present

## 2017-06-14 DIAGNOSIS — F329 Major depressive disorder, single episode, unspecified: Secondary | ICD-10-CM | POA: Diagnosis not present

## 2017-06-14 DIAGNOSIS — Z8673 Personal history of transient ischemic attack (TIA), and cerebral infarction without residual deficits: Secondary | ICD-10-CM | POA: Diagnosis not present

## 2017-06-14 DIAGNOSIS — R4789 Other speech disturbances: Secondary | ICD-10-CM | POA: Diagnosis present

## 2017-06-14 DIAGNOSIS — I6789 Other cerebrovascular disease: Secondary | ICD-10-CM | POA: Diagnosis not present

## 2017-06-14 DIAGNOSIS — Z79899 Other long term (current) drug therapy: Secondary | ICD-10-CM | POA: Diagnosis not present

## 2017-06-14 DIAGNOSIS — E119 Type 2 diabetes mellitus without complications: Secondary | ICD-10-CM | POA: Diagnosis not present

## 2017-06-14 DIAGNOSIS — R29715 NIHSS score 15: Secondary | ICD-10-CM | POA: Diagnosis not present

## 2017-06-14 DIAGNOSIS — I10 Essential (primary) hypertension: Secondary | ICD-10-CM | POA: Diagnosis not present

## 2017-06-14 DIAGNOSIS — E782 Mixed hyperlipidemia: Secondary | ICD-10-CM | POA: Diagnosis not present

## 2017-06-14 DIAGNOSIS — I619 Nontraumatic intracerebral hemorrhage, unspecified: Secondary | ICD-10-CM | POA: Diagnosis not present

## 2017-06-14 DIAGNOSIS — I5189 Other ill-defined heart diseases: Secondary | ICD-10-CM | POA: Diagnosis not present

## 2017-06-14 DIAGNOSIS — Z91041 Radiographic dye allergy status: Secondary | ICD-10-CM | POA: Diagnosis not present

## 2017-06-14 DIAGNOSIS — M1990 Unspecified osteoarthritis, unspecified site: Secondary | ICD-10-CM | POA: Diagnosis not present

## 2017-06-14 DIAGNOSIS — I351 Nonrheumatic aortic (valve) insufficiency: Secondary | ICD-10-CM | POA: Diagnosis not present

## 2017-06-14 DIAGNOSIS — I639 Cerebral infarction, unspecified: Secondary | ICD-10-CM | POA: Diagnosis not present

## 2017-06-14 DIAGNOSIS — S79912A Unspecified injury of left hip, initial encounter: Secondary | ICD-10-CM | POA: Diagnosis not present

## 2017-06-14 DIAGNOSIS — R29818 Other symptoms and signs involving the nervous system: Secondary | ICD-10-CM | POA: Diagnosis not present

## 2017-06-14 DIAGNOSIS — E785 Hyperlipidemia, unspecified: Secondary | ICD-10-CM | POA: Diagnosis not present

## 2017-06-14 DIAGNOSIS — I63233 Cerebral infarction due to unspecified occlusion or stenosis of bilateral carotid arteries: Secondary | ICD-10-CM | POA: Diagnosis not present

## 2017-06-14 DIAGNOSIS — R29810 Facial weakness: Secondary | ICD-10-CM | POA: Diagnosis not present

## 2017-06-14 DIAGNOSIS — I429 Cardiomyopathy, unspecified: Secondary | ICD-10-CM | POA: Diagnosis not present

## 2017-06-14 DIAGNOSIS — G8194 Hemiplegia, unspecified affecting left nondominant side: Secondary | ICD-10-CM | POA: Diagnosis present

## 2017-06-14 DIAGNOSIS — Z7902 Long term (current) use of antithrombotics/antiplatelets: Secondary | ICD-10-CM | POA: Diagnosis not present

## 2017-06-14 DIAGNOSIS — G8191 Hemiplegia, unspecified affecting right dominant side: Secondary | ICD-10-CM | POA: Diagnosis not present

## 2017-06-14 DIAGNOSIS — G9389 Other specified disorders of brain: Secondary | ICD-10-CM | POA: Diagnosis not present

## 2017-06-14 DIAGNOSIS — I69391 Dysphagia following cerebral infarction: Secondary | ICD-10-CM | POA: Diagnosis not present

## 2017-06-14 DIAGNOSIS — I63521 Cerebral infarction due to unspecified occlusion or stenosis of right anterior cerebral artery: Secondary | ICD-10-CM | POA: Diagnosis not present

## 2017-06-14 DIAGNOSIS — R000 Tachycardia, unspecified: Secondary | ICD-10-CM | POA: Diagnosis not present

## 2017-06-14 DIAGNOSIS — Z951 Presence of aortocoronary bypass graft: Secondary | ICD-10-CM | POA: Diagnosis not present

## 2017-06-14 DIAGNOSIS — R531 Weakness: Secondary | ICD-10-CM | POA: Diagnosis not present

## 2017-06-14 DIAGNOSIS — E118 Type 2 diabetes mellitus with unspecified complications: Secondary | ICD-10-CM | POA: Diagnosis not present

## 2017-06-14 DIAGNOSIS — F17200 Nicotine dependence, unspecified, uncomplicated: Secondary | ICD-10-CM | POA: Diagnosis not present

## 2017-06-14 DIAGNOSIS — F32A Depression, unspecified: Secondary | ICD-10-CM | POA: Insufficient documentation

## 2017-06-14 DIAGNOSIS — F341 Dysthymic disorder: Secondary | ICD-10-CM | POA: Diagnosis present

## 2017-06-19 DIAGNOSIS — Z7401 Bed confinement status: Secondary | ICD-10-CM | POA: Diagnosis not present

## 2017-06-19 DIAGNOSIS — I639 Cerebral infarction, unspecified: Secondary | ICD-10-CM | POA: Diagnosis not present

## 2017-06-19 DIAGNOSIS — Z7902 Long term (current) use of antithrombotics/antiplatelets: Secondary | ICD-10-CM | POA: Diagnosis not present

## 2017-06-19 DIAGNOSIS — R110 Nausea: Secondary | ICD-10-CM | POA: Diagnosis not present

## 2017-06-19 DIAGNOSIS — S0990XA Unspecified injury of head, initial encounter: Secondary | ICD-10-CM | POA: Diagnosis not present

## 2017-06-19 DIAGNOSIS — E785 Hyperlipidemia, unspecified: Secondary | ICD-10-CM | POA: Diagnosis not present

## 2017-06-19 DIAGNOSIS — S0093XA Contusion of unspecified part of head, initial encounter: Secondary | ICD-10-CM | POA: Diagnosis not present

## 2017-06-19 DIAGNOSIS — Z8673 Personal history of transient ischemic attack (TIA), and cerebral infarction without residual deficits: Secondary | ICD-10-CM | POA: Diagnosis not present

## 2017-06-19 DIAGNOSIS — N179 Acute kidney failure, unspecified: Secondary | ICD-10-CM | POA: Diagnosis not present

## 2017-06-19 DIAGNOSIS — I10 Essential (primary) hypertension: Secondary | ICD-10-CM | POA: Diagnosis not present

## 2017-06-19 DIAGNOSIS — R471 Dysarthria and anarthria: Secondary | ICD-10-CM | POA: Diagnosis not present

## 2017-06-19 DIAGNOSIS — R079 Chest pain, unspecified: Secondary | ICD-10-CM | POA: Diagnosis not present

## 2017-06-19 DIAGNOSIS — R269 Unspecified abnormalities of gait and mobility: Secondary | ICD-10-CM | POA: Diagnosis not present

## 2017-06-19 DIAGNOSIS — S199XXA Unspecified injury of neck, initial encounter: Secondary | ICD-10-CM | POA: Diagnosis not present

## 2017-06-19 DIAGNOSIS — R51 Headache: Secondary | ICD-10-CM | POA: Diagnosis not present

## 2017-06-19 DIAGNOSIS — E1159 Type 2 diabetes mellitus with other circulatory complications: Secondary | ICD-10-CM | POA: Diagnosis not present

## 2017-06-19 DIAGNOSIS — Z888 Allergy status to other drugs, medicaments and biological substances status: Secondary | ICD-10-CM | POA: Diagnosis not present

## 2017-06-19 DIAGNOSIS — Z87891 Personal history of nicotine dependence: Secondary | ICD-10-CM | POA: Diagnosis not present

## 2017-06-19 DIAGNOSIS — I110 Hypertensive heart disease with heart failure: Secondary | ICD-10-CM | POA: Diagnosis not present

## 2017-06-19 DIAGNOSIS — I2510 Atherosclerotic heart disease of native coronary artery without angina pectoris: Secondary | ICD-10-CM | POA: Diagnosis not present

## 2017-06-19 DIAGNOSIS — G9389 Other specified disorders of brain: Secondary | ICD-10-CM | POA: Diagnosis not present

## 2017-06-19 DIAGNOSIS — Z7901 Long term (current) use of anticoagulants: Secondary | ICD-10-CM | POA: Diagnosis not present

## 2017-06-19 DIAGNOSIS — R531 Weakness: Secondary | ICD-10-CM | POA: Diagnosis not present

## 2017-06-19 DIAGNOSIS — Z794 Long term (current) use of insulin: Secondary | ICD-10-CM | POA: Diagnosis not present

## 2017-06-19 DIAGNOSIS — I5023 Acute on chronic systolic (congestive) heart failure: Secondary | ICD-10-CM | POA: Diagnosis not present

## 2017-06-19 DIAGNOSIS — I69322 Dysarthria following cerebral infarction: Secondary | ICD-10-CM | POA: Diagnosis not present

## 2017-06-19 DIAGNOSIS — Z7409 Other reduced mobility: Secondary | ICD-10-CM | POA: Diagnosis not present

## 2017-06-19 DIAGNOSIS — I69354 Hemiplegia and hemiparesis following cerebral infarction affecting left non-dominant side: Secondary | ICD-10-CM | POA: Diagnosis not present

## 2017-06-19 DIAGNOSIS — Z91041 Radiographic dye allergy status: Secondary | ICD-10-CM | POA: Diagnosis not present

## 2017-06-19 DIAGNOSIS — E089 Diabetes mellitus due to underlying condition without complications: Secondary | ICD-10-CM | POA: Diagnosis not present

## 2017-06-19 DIAGNOSIS — I63311 Cerebral infarction due to thrombosis of right middle cerebral artery: Secondary | ICD-10-CM | POA: Diagnosis not present

## 2017-06-19 DIAGNOSIS — I255 Ischemic cardiomyopathy: Secondary | ICD-10-CM | POA: Diagnosis not present

## 2017-06-19 DIAGNOSIS — F418 Other specified anxiety disorders: Secondary | ICD-10-CM | POA: Diagnosis not present

## 2017-06-19 DIAGNOSIS — R0789 Other chest pain: Secondary | ICD-10-CM | POA: Diagnosis not present

## 2017-06-19 DIAGNOSIS — I69398 Other sequelae of cerebral infarction: Secondary | ICD-10-CM | POA: Diagnosis not present

## 2017-06-19 DIAGNOSIS — M47892 Other spondylosis, cervical region: Secondary | ICD-10-CM | POA: Diagnosis not present

## 2017-06-19 DIAGNOSIS — W19XXXA Unspecified fall, initial encounter: Secondary | ICD-10-CM | POA: Diagnosis not present

## 2017-06-19 DIAGNOSIS — R414 Neurologic neglect syndrome: Secondary | ICD-10-CM | POA: Diagnosis not present

## 2017-06-19 DIAGNOSIS — J449 Chronic obstructive pulmonary disease, unspecified: Secondary | ICD-10-CM | POA: Diagnosis not present

## 2017-06-19 DIAGNOSIS — R262 Difficulty in walking, not elsewhere classified: Secondary | ICD-10-CM | POA: Diagnosis not present

## 2017-06-19 DIAGNOSIS — E1165 Type 2 diabetes mellitus with hyperglycemia: Secondary | ICD-10-CM | POA: Diagnosis not present

## 2017-06-19 DIAGNOSIS — I69391 Dysphagia following cerebral infarction: Secondary | ICD-10-CM | POA: Diagnosis not present

## 2017-06-19 DIAGNOSIS — R0602 Shortness of breath: Secondary | ICD-10-CM | POA: Diagnosis not present

## 2017-06-19 DIAGNOSIS — E118 Type 2 diabetes mellitus with unspecified complications: Secondary | ICD-10-CM | POA: Diagnosis not present

## 2017-06-19 DIAGNOSIS — S0993XA Unspecified injury of face, initial encounter: Secondary | ICD-10-CM | POA: Diagnosis not present

## 2017-06-19 DIAGNOSIS — Z79899 Other long term (current) drug therapy: Secondary | ICD-10-CM | POA: Diagnosis not present

## 2017-06-19 DIAGNOSIS — Z951 Presence of aortocoronary bypass graft: Secondary | ICD-10-CM | POA: Diagnosis not present

## 2017-06-19 DIAGNOSIS — F329 Major depressive disorder, single episode, unspecified: Secondary | ICD-10-CM | POA: Diagnosis not present

## 2017-06-19 DIAGNOSIS — M542 Cervicalgia: Secondary | ICD-10-CM | POA: Diagnosis not present

## 2017-06-19 DIAGNOSIS — I25810 Atherosclerosis of coronary artery bypass graft(s) without angina pectoris: Secondary | ICD-10-CM | POA: Diagnosis not present

## 2017-06-19 DIAGNOSIS — I6389 Other cerebral infarction: Secondary | ICD-10-CM | POA: Diagnosis not present

## 2017-06-19 DIAGNOSIS — E119 Type 2 diabetes mellitus without complications: Secondary | ICD-10-CM | POA: Diagnosis not present

## 2017-06-19 MED ORDER — CLOPIDOGREL BISULFATE 75 MG PO TABS
75.00 | ORAL_TABLET | ORAL | Status: DC
Start: 2017-06-20 — End: 2017-06-19

## 2017-06-19 MED ORDER — ALPRAZOLAM 1 MG PO TABS
1.00 | ORAL_TABLET | ORAL | Status: DC
Start: ? — End: 2017-06-19

## 2017-06-19 MED ORDER — LABETALOL HCL 5 MG/ML IV SOLN
10.00 | INTRAVENOUS | Status: DC
Start: ? — End: 2017-06-19

## 2017-06-19 MED ORDER — DOCUSATE SODIUM 100 MG PO CAPS
100.00 | ORAL_CAPSULE | ORAL | Status: DC
Start: 2017-06-19 — End: 2017-06-19

## 2017-06-19 MED ORDER — DULOXETINE HCL 30 MG PO CPEP
60.00 | ORAL_CAPSULE | ORAL | Status: DC
Start: 2017-06-20 — End: 2017-06-19

## 2017-06-19 MED ORDER — ISOSORBIDE MONONITRATE ER 60 MG PO TB24
60.00 | ORAL_TABLET | ORAL | Status: DC
Start: 2017-06-19 — End: 2017-06-19

## 2017-06-19 MED ORDER — PAROXETINE HCL 20 MG PO TABS
20.00 | ORAL_TABLET | ORAL | Status: DC
Start: 2017-06-20 — End: 2017-06-19

## 2017-06-19 MED ORDER — ATORVASTATIN CALCIUM 40 MG PO TABS
40.00 | ORAL_TABLET | ORAL | Status: DC
Start: 2017-06-19 — End: 2017-06-19

## 2017-06-19 MED ORDER — ACETAMINOPHEN 500 MG PO TABS
1000.00 | ORAL_TABLET | ORAL | Status: DC
Start: 2017-06-19 — End: 2017-06-19

## 2017-06-19 MED ORDER — GENERIC EXTERNAL MEDICATION
Status: DC
Start: ? — End: 2017-06-19

## 2017-06-19 MED ORDER — INSULIN LISPRO 100 UNIT/ML ~~LOC~~ SOLN
SUBCUTANEOUS | Status: DC
Start: ? — End: 2017-06-19

## 2017-06-19 MED ORDER — HEPARIN SODIUM (PORCINE) 5000 UNIT/ML IJ SOLN
INTRAMUSCULAR | Status: DC
Start: 2017-06-19 — End: 2017-06-19

## 2017-06-19 MED ORDER — AMLODIPINE BESYLATE 10 MG PO TABS
10.00 | ORAL_TABLET | ORAL | Status: DC
Start: 2017-06-20 — End: 2017-06-19

## 2017-06-19 MED ORDER — CARVEDILOL 12.5 MG PO TABS
25.00 | ORAL_TABLET | ORAL | Status: DC
Start: 2017-06-19 — End: 2017-06-19

## 2017-06-19 MED ORDER — LOSARTAN POTASSIUM 100 MG PO TABS
100.00 | ORAL_TABLET | ORAL | Status: DC
Start: 2017-06-20 — End: 2017-06-19

## 2017-06-19 MED ORDER — NICOTINE 21 MG/24HR TD PT24
MEDICATED_PATCH | TRANSDERMAL | Status: DC
Start: 2017-06-20 — End: 2017-06-19

## 2017-06-19 MED ORDER — FENOFIBRATE 54 MG PO TABS
145.00 | ORAL_TABLET | ORAL | Status: DC
Start: 2017-06-20 — End: 2017-06-19

## 2017-06-19 MED ORDER — SODIUM CHLORIDE 0.9 % IV SOLN
INTRAVENOUS | Status: DC
Start: ? — End: 2017-06-19

## 2017-06-19 MED ORDER — HYDROXYZINE HCL 25 MG PO TABS
25.00 | ORAL_TABLET | ORAL | Status: DC
Start: ? — End: 2017-06-19

## 2017-06-19 MED ORDER — SPIRONOLACTONE 25 MG PO TABS
25.00 | ORAL_TABLET | ORAL | Status: DC
Start: 2017-06-20 — End: 2017-06-19

## 2017-06-19 MED ORDER — ONDANSETRON HCL 4 MG/2ML IJ SOLN
4.00 | INTRAMUSCULAR | Status: DC
Start: ? — End: 2017-06-19

## 2017-06-19 MED ORDER — ASPIRIN 325 MG PO TABS
325.00 | ORAL_TABLET | ORAL | Status: DC
Start: 2017-06-20 — End: 2017-06-19

## 2017-07-01 DIAGNOSIS — J449 Chronic obstructive pulmonary disease, unspecified: Secondary | ICD-10-CM | POA: Diagnosis not present

## 2017-07-01 DIAGNOSIS — S0990XA Unspecified injury of head, initial encounter: Secondary | ICD-10-CM | POA: Diagnosis not present

## 2017-07-01 DIAGNOSIS — W19XXXA Unspecified fall, initial encounter: Secondary | ICD-10-CM | POA: Diagnosis not present

## 2017-07-01 DIAGNOSIS — M542 Cervicalgia: Secondary | ICD-10-CM | POA: Diagnosis not present

## 2017-07-01 DIAGNOSIS — Z8673 Personal history of transient ischemic attack (TIA), and cerebral infarction without residual deficits: Secondary | ICD-10-CM | POA: Diagnosis not present

## 2017-07-01 DIAGNOSIS — S199XXA Unspecified injury of neck, initial encounter: Secondary | ICD-10-CM | POA: Diagnosis not present

## 2017-07-01 DIAGNOSIS — E119 Type 2 diabetes mellitus without complications: Secondary | ICD-10-CM | POA: Diagnosis not present

## 2017-07-01 DIAGNOSIS — R51 Headache: Secondary | ICD-10-CM | POA: Diagnosis not present

## 2017-07-01 DIAGNOSIS — I10 Essential (primary) hypertension: Secondary | ICD-10-CM | POA: Diagnosis not present

## 2017-07-01 DIAGNOSIS — S0993XA Unspecified injury of face, initial encounter: Secondary | ICD-10-CM | POA: Diagnosis not present

## 2017-07-01 DIAGNOSIS — G9389 Other specified disorders of brain: Secondary | ICD-10-CM | POA: Diagnosis not present

## 2017-07-01 DIAGNOSIS — M47892 Other spondylosis, cervical region: Secondary | ICD-10-CM | POA: Diagnosis not present

## 2017-07-01 DIAGNOSIS — I69354 Hemiplegia and hemiparesis following cerebral infarction affecting left non-dominant side: Secondary | ICD-10-CM | POA: Diagnosis not present

## 2017-07-02 DIAGNOSIS — R0989 Other specified symptoms and signs involving the circulatory and respiratory systems: Secondary | ICD-10-CM | POA: Diagnosis not present

## 2017-07-02 DIAGNOSIS — I110 Hypertensive heart disease with heart failure: Secondary | ICD-10-CM | POA: Diagnosis not present

## 2017-07-02 DIAGNOSIS — R079 Chest pain, unspecified: Secondary | ICD-10-CM | POA: Diagnosis not present

## 2017-07-02 DIAGNOSIS — Z8673 Personal history of transient ischemic attack (TIA), and cerebral infarction without residual deficits: Secondary | ICD-10-CM | POA: Diagnosis not present

## 2017-07-02 DIAGNOSIS — Z7901 Long term (current) use of anticoagulants: Secondary | ICD-10-CM | POA: Diagnosis not present

## 2017-07-02 DIAGNOSIS — I5023 Acute on chronic systolic (congestive) heart failure: Secondary | ICD-10-CM | POA: Diagnosis not present

## 2017-07-02 DIAGNOSIS — J449 Chronic obstructive pulmonary disease, unspecified: Secondary | ICD-10-CM | POA: Diagnosis not present

## 2017-07-02 DIAGNOSIS — R0602 Shortness of breath: Secondary | ICD-10-CM | POA: Diagnosis not present

## 2017-07-02 DIAGNOSIS — I10 Essential (primary) hypertension: Secondary | ICD-10-CM | POA: Diagnosis not present

## 2017-07-02 DIAGNOSIS — I69354 Hemiplegia and hemiparesis following cerebral infarction affecting left non-dominant side: Secondary | ICD-10-CM | POA: Diagnosis not present

## 2017-07-02 DIAGNOSIS — Z87891 Personal history of nicotine dependence: Secondary | ICD-10-CM | POA: Diagnosis not present

## 2017-07-02 DIAGNOSIS — R531 Weakness: Secondary | ICD-10-CM | POA: Diagnosis not present

## 2017-07-02 DIAGNOSIS — I6389 Other cerebral infarction: Secondary | ICD-10-CM | POA: Diagnosis not present

## 2017-07-02 DIAGNOSIS — N179 Acute kidney failure, unspecified: Secondary | ICD-10-CM | POA: Diagnosis not present

## 2017-07-02 DIAGNOSIS — E119 Type 2 diabetes mellitus without complications: Secondary | ICD-10-CM | POA: Diagnosis not present

## 2017-07-02 DIAGNOSIS — R262 Difficulty in walking, not elsewhere classified: Secondary | ICD-10-CM | POA: Diagnosis not present

## 2017-07-02 DIAGNOSIS — I517 Cardiomegaly: Secondary | ICD-10-CM | POA: Diagnosis not present

## 2017-07-05 DIAGNOSIS — R079 Chest pain, unspecified: Secondary | ICD-10-CM | POA: Diagnosis not present

## 2017-07-05 DIAGNOSIS — Z7982 Long term (current) use of aspirin: Secondary | ICD-10-CM | POA: Diagnosis not present

## 2017-07-05 DIAGNOSIS — I5189 Other ill-defined heart diseases: Secondary | ICD-10-CM | POA: Diagnosis not present

## 2017-07-05 DIAGNOSIS — N179 Acute kidney failure, unspecified: Secondary | ICD-10-CM | POA: Diagnosis not present

## 2017-07-05 DIAGNOSIS — Z79899 Other long term (current) drug therapy: Secondary | ICD-10-CM | POA: Diagnosis not present

## 2017-07-05 DIAGNOSIS — Z794 Long term (current) use of insulin: Secondary | ICD-10-CM | POA: Diagnosis not present

## 2017-07-05 DIAGNOSIS — I517 Cardiomegaly: Secondary | ICD-10-CM | POA: Diagnosis not present

## 2017-07-05 DIAGNOSIS — I110 Hypertensive heart disease with heart failure: Secondary | ICD-10-CM | POA: Diagnosis present

## 2017-07-05 DIAGNOSIS — Z951 Presence of aortocoronary bypass graft: Secondary | ICD-10-CM | POA: Diagnosis not present

## 2017-07-05 DIAGNOSIS — I69354 Hemiplegia and hemiparesis following cerebral infarction affecting left non-dominant side: Secondary | ICD-10-CM | POA: Diagnosis not present

## 2017-07-05 DIAGNOSIS — E119 Type 2 diabetes mellitus without complications: Secondary | ICD-10-CM | POA: Diagnosis present

## 2017-07-05 DIAGNOSIS — I959 Hypotension, unspecified: Secondary | ICD-10-CM | POA: Diagnosis not present

## 2017-07-05 DIAGNOSIS — I5023 Acute on chronic systolic (congestive) heart failure: Secondary | ICD-10-CM | POA: Insufficient documentation

## 2017-07-05 DIAGNOSIS — J449 Chronic obstructive pulmonary disease, unspecified: Secondary | ICD-10-CM | POA: Diagnosis present

## 2017-07-05 DIAGNOSIS — Z87891 Personal history of nicotine dependence: Secondary | ICD-10-CM | POA: Diagnosis not present

## 2017-07-05 DIAGNOSIS — Z886 Allergy status to analgesic agent status: Secondary | ICD-10-CM | POA: Diagnosis not present

## 2017-07-05 DIAGNOSIS — Z91041 Radiographic dye allergy status: Secondary | ICD-10-CM | POA: Diagnosis not present

## 2017-07-05 DIAGNOSIS — R0789 Other chest pain: Secondary | ICD-10-CM | POA: Insufficient documentation

## 2017-07-05 DIAGNOSIS — R0989 Other specified symptoms and signs involving the circulatory and respiratory systems: Secondary | ICD-10-CM | POA: Diagnosis not present

## 2017-07-05 DIAGNOSIS — I63311 Cerebral infarction due to thrombosis of right middle cerebral artery: Secondary | ICD-10-CM | POA: Diagnosis not present

## 2017-07-05 DIAGNOSIS — I2510 Atherosclerotic heart disease of native coronary artery without angina pectoris: Secondary | ICD-10-CM | POA: Diagnosis not present

## 2017-07-05 DIAGNOSIS — I350 Nonrheumatic aortic (valve) stenosis: Secondary | ICD-10-CM | POA: Diagnosis not present

## 2017-07-05 DIAGNOSIS — Z7902 Long term (current) use of antithrombotics/antiplatelets: Secondary | ICD-10-CM | POA: Diagnosis not present

## 2017-07-05 DIAGNOSIS — I255 Ischemic cardiomyopathy: Secondary | ICD-10-CM | POA: Diagnosis present

## 2017-07-06 DIAGNOSIS — N179 Acute kidney failure, unspecified: Secondary | ICD-10-CM | POA: Insufficient documentation

## 2017-07-08 DIAGNOSIS — F418 Other specified anxiety disorders: Secondary | ICD-10-CM | POA: Diagnosis not present

## 2017-07-08 DIAGNOSIS — I110 Hypertensive heart disease with heart failure: Secondary | ICD-10-CM | POA: Diagnosis not present

## 2017-07-08 DIAGNOSIS — F17200 Nicotine dependence, unspecified, uncomplicated: Secondary | ICD-10-CM | POA: Diagnosis not present

## 2017-07-08 DIAGNOSIS — I10 Essential (primary) hypertension: Secondary | ICD-10-CM | POA: Diagnosis not present

## 2017-07-08 DIAGNOSIS — I69354 Hemiplegia and hemiparesis following cerebral infarction affecting left non-dominant side: Secondary | ICD-10-CM | POA: Diagnosis not present

## 2017-07-08 DIAGNOSIS — I69398 Other sequelae of cerebral infarction: Secondary | ICD-10-CM | POA: Diagnosis not present

## 2017-07-08 DIAGNOSIS — E118 Type 2 diabetes mellitus with unspecified complications: Secondary | ICD-10-CM | POA: Diagnosis not present

## 2017-07-08 DIAGNOSIS — R531 Weakness: Secondary | ICD-10-CM | POA: Diagnosis not present

## 2017-07-08 DIAGNOSIS — R0789 Other chest pain: Secondary | ICD-10-CM | POA: Diagnosis not present

## 2017-07-08 DIAGNOSIS — Z7409 Other reduced mobility: Secondary | ICD-10-CM | POA: Diagnosis not present

## 2017-07-08 DIAGNOSIS — I2510 Atherosclerotic heart disease of native coronary artery without angina pectoris: Secondary | ICD-10-CM | POA: Diagnosis not present

## 2017-07-08 DIAGNOSIS — Z794 Long term (current) use of insulin: Secondary | ICD-10-CM | POA: Diagnosis not present

## 2017-07-08 DIAGNOSIS — F329 Major depressive disorder, single episode, unspecified: Secondary | ICD-10-CM | POA: Diagnosis not present

## 2017-07-08 DIAGNOSIS — E7800 Pure hypercholesterolemia, unspecified: Secondary | ICD-10-CM | POA: Diagnosis not present

## 2017-07-08 DIAGNOSIS — R414 Neurologic neglect syndrome: Secondary | ICD-10-CM | POA: Diagnosis not present

## 2017-07-08 DIAGNOSIS — I25810 Atherosclerosis of coronary artery bypass graft(s) without angina pectoris: Secondary | ICD-10-CM | POA: Diagnosis not present

## 2017-07-08 DIAGNOSIS — I5023 Acute on chronic systolic (congestive) heart failure: Secondary | ICD-10-CM | POA: Diagnosis not present

## 2017-07-08 DIAGNOSIS — I639 Cerebral infarction, unspecified: Secondary | ICD-10-CM | POA: Diagnosis not present

## 2017-07-08 DIAGNOSIS — I63311 Cerebral infarction due to thrombosis of right middle cerebral artery: Secondary | ICD-10-CM | POA: Diagnosis not present

## 2017-07-08 DIAGNOSIS — I255 Ischemic cardiomyopathy: Secondary | ICD-10-CM | POA: Diagnosis not present

## 2017-07-08 DIAGNOSIS — N179 Acute kidney failure, unspecified: Secondary | ICD-10-CM | POA: Diagnosis not present

## 2017-07-11 DIAGNOSIS — I110 Hypertensive heart disease with heart failure: Secondary | ICD-10-CM | POA: Diagnosis not present

## 2017-07-11 DIAGNOSIS — I255 Ischemic cardiomyopathy: Secondary | ICD-10-CM | POA: Diagnosis not present

## 2017-07-11 DIAGNOSIS — I2510 Atherosclerotic heart disease of native coronary artery without angina pectoris: Secondary | ICD-10-CM | POA: Diagnosis not present

## 2017-07-11 DIAGNOSIS — I69354 Hemiplegia and hemiparesis following cerebral infarction affecting left non-dominant side: Secondary | ICD-10-CM | POA: Diagnosis not present

## 2017-07-11 DIAGNOSIS — E119 Type 2 diabetes mellitus without complications: Secondary | ICD-10-CM | POA: Diagnosis not present

## 2017-07-11 DIAGNOSIS — I5023 Acute on chronic systolic (congestive) heart failure: Secondary | ICD-10-CM | POA: Diagnosis not present

## 2017-07-12 DIAGNOSIS — I2510 Atherosclerotic heart disease of native coronary artery without angina pectoris: Secondary | ICD-10-CM | POA: Diagnosis not present

## 2017-07-12 DIAGNOSIS — I69354 Hemiplegia and hemiparesis following cerebral infarction affecting left non-dominant side: Secondary | ICD-10-CM | POA: Diagnosis not present

## 2017-07-12 DIAGNOSIS — E119 Type 2 diabetes mellitus without complications: Secondary | ICD-10-CM | POA: Diagnosis not present

## 2017-07-12 DIAGNOSIS — I5023 Acute on chronic systolic (congestive) heart failure: Secondary | ICD-10-CM | POA: Diagnosis not present

## 2017-07-12 DIAGNOSIS — I255 Ischemic cardiomyopathy: Secondary | ICD-10-CM | POA: Diagnosis not present

## 2017-07-12 DIAGNOSIS — I110 Hypertensive heart disease with heart failure: Secondary | ICD-10-CM | POA: Diagnosis not present

## 2017-07-14 DIAGNOSIS — G8102 Flaccid hemiplegia affecting left dominant side: Secondary | ICD-10-CM | POA: Diagnosis not present

## 2017-07-14 DIAGNOSIS — I5023 Acute on chronic systolic (congestive) heart failure: Secondary | ICD-10-CM | POA: Diagnosis not present

## 2017-07-14 DIAGNOSIS — Z6831 Body mass index (BMI) 31.0-31.9, adult: Secondary | ICD-10-CM | POA: Diagnosis not present

## 2017-07-16 DIAGNOSIS — I5023 Acute on chronic systolic (congestive) heart failure: Secondary | ICD-10-CM | POA: Diagnosis not present

## 2017-07-16 DIAGNOSIS — I2510 Atherosclerotic heart disease of native coronary artery without angina pectoris: Secondary | ICD-10-CM | POA: Diagnosis not present

## 2017-07-16 DIAGNOSIS — I69354 Hemiplegia and hemiparesis following cerebral infarction affecting left non-dominant side: Secondary | ICD-10-CM | POA: Diagnosis not present

## 2017-07-16 DIAGNOSIS — I255 Ischemic cardiomyopathy: Secondary | ICD-10-CM | POA: Diagnosis not present

## 2017-07-16 DIAGNOSIS — I110 Hypertensive heart disease with heart failure: Secondary | ICD-10-CM | POA: Diagnosis not present

## 2017-07-16 DIAGNOSIS — E119 Type 2 diabetes mellitus without complications: Secondary | ICD-10-CM | POA: Diagnosis not present

## 2017-07-18 DIAGNOSIS — I5023 Acute on chronic systolic (congestive) heart failure: Secondary | ICD-10-CM | POA: Diagnosis not present

## 2017-07-18 DIAGNOSIS — E119 Type 2 diabetes mellitus without complications: Secondary | ICD-10-CM | POA: Diagnosis not present

## 2017-07-18 DIAGNOSIS — I69354 Hemiplegia and hemiparesis following cerebral infarction affecting left non-dominant side: Secondary | ICD-10-CM | POA: Diagnosis not present

## 2017-07-18 DIAGNOSIS — I110 Hypertensive heart disease with heart failure: Secondary | ICD-10-CM | POA: Diagnosis not present

## 2017-07-18 DIAGNOSIS — I2510 Atherosclerotic heart disease of native coronary artery without angina pectoris: Secondary | ICD-10-CM | POA: Diagnosis not present

## 2017-07-18 DIAGNOSIS — I255 Ischemic cardiomyopathy: Secondary | ICD-10-CM | POA: Diagnosis not present

## 2017-07-19 DIAGNOSIS — W050XXA Fall from non-moving wheelchair, initial encounter: Secondary | ICD-10-CM | POA: Diagnosis not present

## 2017-07-19 DIAGNOSIS — F17200 Nicotine dependence, unspecified, uncomplicated: Secondary | ICD-10-CM | POA: Diagnosis not present

## 2017-07-19 DIAGNOSIS — S4992XA Unspecified injury of left shoulder and upper arm, initial encounter: Secondary | ICD-10-CM | POA: Diagnosis not present

## 2017-07-19 DIAGNOSIS — R279 Unspecified lack of coordination: Secondary | ICD-10-CM | POA: Diagnosis not present

## 2017-07-19 DIAGNOSIS — R404 Transient alteration of awareness: Secondary | ICD-10-CM | POA: Diagnosis not present

## 2017-07-19 DIAGNOSIS — S40012A Contusion of left shoulder, initial encounter: Secondary | ICD-10-CM | POA: Diagnosis not present

## 2017-07-19 DIAGNOSIS — E119 Type 2 diabetes mellitus without complications: Secondary | ICD-10-CM | POA: Diagnosis not present

## 2017-07-19 DIAGNOSIS — I69354 Hemiplegia and hemiparesis following cerebral infarction affecting left non-dominant side: Secondary | ICD-10-CM | POA: Diagnosis not present

## 2017-07-19 DIAGNOSIS — I2510 Atherosclerotic heart disease of native coronary artery without angina pectoris: Secondary | ICD-10-CM | POA: Diagnosis not present

## 2017-07-19 DIAGNOSIS — Z7401 Bed confinement status: Secondary | ICD-10-CM | POA: Diagnosis not present

## 2017-07-19 DIAGNOSIS — Z79899 Other long term (current) drug therapy: Secondary | ICD-10-CM | POA: Diagnosis not present

## 2017-07-19 DIAGNOSIS — M25552 Pain in left hip: Secondary | ICD-10-CM | POA: Diagnosis not present

## 2017-07-19 DIAGNOSIS — R531 Weakness: Secondary | ICD-10-CM | POA: Diagnosis not present

## 2017-07-19 DIAGNOSIS — S7002XA Contusion of left hip, initial encounter: Secondary | ICD-10-CM | POA: Diagnosis not present

## 2017-07-19 DIAGNOSIS — I10 Essential (primary) hypertension: Secondary | ICD-10-CM | POA: Diagnosis not present

## 2017-07-19 DIAGNOSIS — F329 Major depressive disorder, single episode, unspecified: Secondary | ICD-10-CM | POA: Diagnosis not present

## 2017-07-19 DIAGNOSIS — S50312A Abrasion of left elbow, initial encounter: Secondary | ICD-10-CM | POA: Diagnosis not present

## 2017-07-19 DIAGNOSIS — K4090 Unilateral inguinal hernia, without obstruction or gangrene, not specified as recurrent: Secondary | ICD-10-CM | POA: Diagnosis not present

## 2017-07-19 DIAGNOSIS — Z7902 Long term (current) use of antithrombotics/antiplatelets: Secondary | ICD-10-CM | POA: Diagnosis not present

## 2017-07-19 DIAGNOSIS — M1990 Unspecified osteoarthritis, unspecified site: Secondary | ICD-10-CM | POA: Diagnosis not present

## 2017-07-19 DIAGNOSIS — S79912A Unspecified injury of left hip, initial encounter: Secondary | ICD-10-CM | POA: Diagnosis not present

## 2017-07-19 DIAGNOSIS — R51 Headache: Secondary | ICD-10-CM | POA: Diagnosis not present

## 2017-07-19 DIAGNOSIS — S299XXA Unspecified injury of thorax, initial encounter: Secondary | ICD-10-CM | POA: Diagnosis not present

## 2017-07-19 DIAGNOSIS — M25512 Pain in left shoulder: Secondary | ICD-10-CM | POA: Diagnosis not present

## 2017-07-19 DIAGNOSIS — Z7982 Long term (current) use of aspirin: Secondary | ICD-10-CM | POA: Diagnosis not present

## 2017-07-21 DIAGNOSIS — E119 Type 2 diabetes mellitus without complications: Secondary | ICD-10-CM | POA: Diagnosis not present

## 2017-07-21 DIAGNOSIS — I2510 Atherosclerotic heart disease of native coronary artery without angina pectoris: Secondary | ICD-10-CM | POA: Diagnosis not present

## 2017-07-21 DIAGNOSIS — I110 Hypertensive heart disease with heart failure: Secondary | ICD-10-CM | POA: Diagnosis not present

## 2017-07-21 DIAGNOSIS — I69354 Hemiplegia and hemiparesis following cerebral infarction affecting left non-dominant side: Secondary | ICD-10-CM | POA: Diagnosis not present

## 2017-07-21 DIAGNOSIS — I5023 Acute on chronic systolic (congestive) heart failure: Secondary | ICD-10-CM | POA: Diagnosis not present

## 2017-07-21 DIAGNOSIS — I255 Ischemic cardiomyopathy: Secondary | ICD-10-CM | POA: Diagnosis not present

## 2017-07-22 DIAGNOSIS — I2510 Atherosclerotic heart disease of native coronary artery without angina pectoris: Secondary | ICD-10-CM | POA: Diagnosis not present

## 2017-07-22 DIAGNOSIS — E119 Type 2 diabetes mellitus without complications: Secondary | ICD-10-CM | POA: Diagnosis not present

## 2017-07-22 DIAGNOSIS — I255 Ischemic cardiomyopathy: Secondary | ICD-10-CM | POA: Diagnosis not present

## 2017-07-22 DIAGNOSIS — I69354 Hemiplegia and hemiparesis following cerebral infarction affecting left non-dominant side: Secondary | ICD-10-CM | POA: Diagnosis not present

## 2017-07-22 DIAGNOSIS — I5023 Acute on chronic systolic (congestive) heart failure: Secondary | ICD-10-CM | POA: Diagnosis not present

## 2017-07-22 DIAGNOSIS — I110 Hypertensive heart disease with heart failure: Secondary | ICD-10-CM | POA: Diagnosis not present

## 2017-07-23 DIAGNOSIS — I69354 Hemiplegia and hemiparesis following cerebral infarction affecting left non-dominant side: Secondary | ICD-10-CM | POA: Diagnosis not present

## 2017-07-23 DIAGNOSIS — I110 Hypertensive heart disease with heart failure: Secondary | ICD-10-CM | POA: Diagnosis not present

## 2017-07-23 DIAGNOSIS — I255 Ischemic cardiomyopathy: Secondary | ICD-10-CM | POA: Diagnosis not present

## 2017-07-23 DIAGNOSIS — I5023 Acute on chronic systolic (congestive) heart failure: Secondary | ICD-10-CM | POA: Diagnosis not present

## 2017-07-23 DIAGNOSIS — I2510 Atherosclerotic heart disease of native coronary artery without angina pectoris: Secondary | ICD-10-CM | POA: Diagnosis not present

## 2017-07-23 DIAGNOSIS — E119 Type 2 diabetes mellitus without complications: Secondary | ICD-10-CM | POA: Diagnosis not present

## 2017-07-25 DIAGNOSIS — I110 Hypertensive heart disease with heart failure: Secondary | ICD-10-CM | POA: Diagnosis not present

## 2017-07-25 DIAGNOSIS — I5023 Acute on chronic systolic (congestive) heart failure: Secondary | ICD-10-CM | POA: Diagnosis not present

## 2017-07-25 DIAGNOSIS — I2510 Atherosclerotic heart disease of native coronary artery without angina pectoris: Secondary | ICD-10-CM | POA: Diagnosis not present

## 2017-07-25 DIAGNOSIS — I255 Ischemic cardiomyopathy: Secondary | ICD-10-CM | POA: Diagnosis not present

## 2017-07-25 DIAGNOSIS — I69354 Hemiplegia and hemiparesis following cerebral infarction affecting left non-dominant side: Secondary | ICD-10-CM | POA: Diagnosis not present

## 2017-07-25 DIAGNOSIS — E119 Type 2 diabetes mellitus without complications: Secondary | ICD-10-CM | POA: Diagnosis not present

## 2017-07-28 ENCOUNTER — Encounter (HOSPITAL_COMMUNITY): Payer: Self-pay | Admitting: Emergency Medicine

## 2017-07-28 ENCOUNTER — Other Ambulatory Visit: Payer: Self-pay

## 2017-07-28 ENCOUNTER — Emergency Department (HOSPITAL_COMMUNITY)
Admission: EM | Admit: 2017-07-28 | Discharge: 2017-07-29 | Disposition: A | Discharge status: Home / Self Care | Payer: Medicare Other | Attending: Emergency Medicine | Admitting: Emergency Medicine

## 2017-07-28 ENCOUNTER — Emergency Department (HOSPITAL_COMMUNITY): Payer: Medicare Other

## 2017-07-28 DIAGNOSIS — Z7982 Long term (current) use of aspirin: Secondary | ICD-10-CM | POA: Diagnosis not present

## 2017-07-28 DIAGNOSIS — E119 Type 2 diabetes mellitus without complications: Secondary | ICD-10-CM | POA: Diagnosis not present

## 2017-07-28 DIAGNOSIS — F17210 Nicotine dependence, cigarettes, uncomplicated: Secondary | ICD-10-CM | POA: Insufficient documentation

## 2017-07-28 DIAGNOSIS — I5023 Acute on chronic systolic (congestive) heart failure: Secondary | ICD-10-CM | POA: Diagnosis not present

## 2017-07-28 DIAGNOSIS — I69354 Hemiplegia and hemiparesis following cerebral infarction affecting left non-dominant side: Secondary | ICD-10-CM | POA: Diagnosis not present

## 2017-07-28 DIAGNOSIS — R0602 Shortness of breath: Secondary | ICD-10-CM | POA: Insufficient documentation

## 2017-07-28 DIAGNOSIS — I252 Old myocardial infarction: Secondary | ICD-10-CM | POA: Diagnosis not present

## 2017-07-28 DIAGNOSIS — Z7902 Long term (current) use of antithrombotics/antiplatelets: Secondary | ICD-10-CM | POA: Diagnosis not present

## 2017-07-28 DIAGNOSIS — I255 Ischemic cardiomyopathy: Secondary | ICD-10-CM | POA: Diagnosis not present

## 2017-07-28 DIAGNOSIS — I110 Hypertensive heart disease with heart failure: Secondary | ICD-10-CM | POA: Diagnosis not present

## 2017-07-28 DIAGNOSIS — R079 Chest pain, unspecified: Secondary | ICD-10-CM | POA: Diagnosis not present

## 2017-07-28 DIAGNOSIS — Z951 Presence of aortocoronary bypass graft: Secondary | ICD-10-CM | POA: Insufficient documentation

## 2017-07-28 DIAGNOSIS — I509 Heart failure, unspecified: Secondary | ICD-10-CM | POA: Diagnosis not present

## 2017-07-28 DIAGNOSIS — J449 Chronic obstructive pulmonary disease, unspecified: Secondary | ICD-10-CM | POA: Diagnosis not present

## 2017-07-28 DIAGNOSIS — Z8673 Personal history of transient ischemic attack (TIA), and cerebral infarction without residual deficits: Secondary | ICD-10-CM | POA: Diagnosis not present

## 2017-07-28 DIAGNOSIS — Z79899 Other long term (current) drug therapy: Secondary | ICD-10-CM | POA: Insufficient documentation

## 2017-07-28 DIAGNOSIS — R0789 Other chest pain: Secondary | ICD-10-CM | POA: Diagnosis not present

## 2017-07-28 DIAGNOSIS — I2510 Atherosclerotic heart disease of native coronary artery without angina pectoris: Secondary | ICD-10-CM | POA: Diagnosis not present

## 2017-07-28 HISTORY — DX: Heart failure, unspecified: I50.9

## 2017-07-28 LAB — CBC WITH DIFFERENTIAL/PLATELET
Basophils Absolute: 0.1 10*3/uL (ref 0.0–0.1)
Basophils Relative: 1 %
Eosinophils Absolute: 0.6 10*3/uL (ref 0.0–0.7)
Eosinophils Relative: 4 %
HCT: 44.6 % (ref 39.0–52.0)
Hemoglobin: 14.9 g/dL (ref 13.0–17.0)
Lymphocytes Relative: 19 %
Lymphs Abs: 2.5 10*3/uL (ref 0.7–4.0)
MCH: 29 pg (ref 26.0–34.0)
MCHC: 33.4 g/dL (ref 30.0–36.0)
MCV: 86.8 fL (ref 78.0–100.0)
Monocytes Absolute: 1 10*3/uL (ref 0.1–1.0)
Monocytes Relative: 7 %
Neutro Abs: 9.3 10*3/uL — ABNORMAL HIGH (ref 1.7–7.7)
Neutrophils Relative %: 69 %
Platelets: 254 10*3/uL (ref 150–400)
RBC: 5.14 MIL/uL (ref 4.22–5.81)
RDW: 13.9 % (ref 11.5–15.5)
WBC: 13.4 10*3/uL — ABNORMAL HIGH (ref 4.0–10.5)

## 2017-07-28 LAB — BASIC METABOLIC PANEL
Anion gap: 11 (ref 5–15)
BUN: 22 mg/dL — ABNORMAL HIGH (ref 6–20)
CO2: 25 mmol/L (ref 22–32)
Calcium: 9.5 mg/dL (ref 8.9–10.3)
Chloride: 103 mmol/L (ref 101–111)
Creatinine, Ser: 1.09 mg/dL (ref 0.61–1.24)
GFR calc Af Amer: >60 mL/min (ref >60)
GFR calc non Af Amer: >60 mL/min (ref >60)
Glucose, Bld: 119 mg/dL — ABNORMAL HIGH (ref 65–99)
Potassium: 3.3 mmol/L — ABNORMAL LOW (ref 3.5–5.1)
Sodium: 139 mmol/L (ref 135–145)

## 2017-07-28 LAB — TROPONIN I
Troponin I: <0.03 ng/mL (ref <0.03)
Troponin I: <0.03 ng/mL (ref <0.03)

## 2017-07-28 LAB — BRAIN NATRIURETIC PEPTIDE: B Natriuretic Peptide: 138 pg/mL — ABNORMAL HIGH (ref 0.0–100.0)

## 2017-07-28 NOTE — ED Triage Notes (Signed)
Pt c/o sob/chest tight x 1 hr. Nausea. No sob or resp distress noted. Denies cough. Non diaphoretic. ble swelling. staes feels like his CHF

## 2017-07-28 NOTE — ED Provider Notes (Signed)
[facility] EMERGENCY DEPARTMENT Provider Note   CSN: [id number] Arrival date & time: 07/28/17  1818     History   Chief Complaint Chief Complaint  Patient presents with  . Shortness of Breath    HPI [name] is a 52 y.o. male.  HPI Patient presents to the emergency room for evaluation of shortness of breath and chest tightness.  Patient has history of multiple medical problems including coronary artery disease status post CABG, non-ST elevation MI, COPD and prior stroke.  Patient states he was at home today when he had sudden onset of shortness of breath and chest tightness that he said lasted for about 25 minutes.  His symptoms have resolved now.  He denies any fevers or coughing.  He has had some leg swelling. Past Medical History:  Diagnosis Date  . Anaphylaxis    IGE mediated  . Anxiety   . Arthritis   . Cardiomyopathy ([place])   . CHF (congestive [name] failure) ([place])   . COPD (chronic obstructive pulmonary disease) ([place])   . Coronary atherosclerosis of native coronary artery    Multivessel status post CABG 2010  . Depression   . Essential hypertension, benign   . History of CVA (cerebrovascular accident) 01/2012   Right posterior frontal cortical and subcortical brain by MRI, no hemorrhage. Carotid Dopplers showed only 1-50% bilateral ICA stenoses. Echocardiogram showed LVEF 50%, no major valvular abnormalities.  . Mixed hyperlipidemia   . Myocardial infarction ([place]) 2010  . OSA (obstructive sleep apnea)   . Stroke ([place])   . Type 2 diabetes mellitus [facility])     Patient Active Problem List   Diagnosis Date Noted  . NSTEMI (non-ST elevated myocardial infarction) ([place]) 04/28/2014  . Secondary cardiomyopathy ([place]) 12/31/2013  . History of noncompliance with medical treatment 10/31/2013  . Obesity (BMI 30-39.9) 10/31/2013  . Sleep apnea 10/31/2013  . Anxiety   . COPD (chronic obstructive pulmonary disease) ([place])   . History of stroke 02/07/2012  . Tobacco  abuse   . Essential hypertension, benign 08/16/2008  . Hyperlipidemia   . Coronary atherosclerosis of native coronary artery     Past Surgical History:  Procedure Laterality Date  . CHOLECYSTECTOMY    . CORONARY ARTERY BYPASS GRAFT  2010   LIMA to LAD, SVG to diagonal, SVG to OM1 and OM 2, SVG to RCA  . DENTAL SURGERY  2003  . GASTRIC BYPASS  2010  . HERNIA REPAIR  2011, 2012  . INCISIONAL HERNIA REPAIR N/A 07/23/2013   Procedure: HERNIA REPAIR INCISIONAL WITH MESH;  Surgeon: Jamesetta So, MD;  Location: [facility];  Service: General;  Laterality: N/A;  . INSERTION OF MESH N/A 07/23/2013   Procedure: INSERTION OF MESH;  Surgeon: Jamesetta So, MD;  Location: [facility];  Service: General;  Laterality: N/A;  . LEFT [name] CATHETERIZATION WITH CORONARY/GRAFT ANGIOGRAM N/A 11/01/2013   Procedure: LEFT [name] CATHETERIZATION WITH Beatrix Fetters;  Surgeon: Blane Ohara, MD;  Location: [facility] CATH LAB;  Service: Cardiovascular;  Laterality: N/A;  . TOE AMPUTATION  1998   right 1st and 2nd toe  . TONSILECTOMY, ADENOIDECTOMY, BILATERAL MYRINGOTOMY AND TUBES    . TONSILLECTOMY    . VENTRAL HERNIA REPAIR N/A 10/28/2012   Procedure: LAPAROSCOPIC VENTRAL HERNIA;  Surgeon: Donato Heinz, MD;  Location: [facility];  Service: General;  Laterality: N/A;       Home Medications    Prior to Admission medications   Medication Sig Start Date End Date  Taking? Authorizing Provider  albuterol (PROVENTIL HFA;VENTOLIN HFA) 108 (90 BASE) MCG/ACT inhaler Inhale 2 puffs into the lungs every 6 (six) hours as needed for wheezing or shortness of breath.    [provider]  ALPRAZolam Duanne Moron) 1 MG tablet Take 1 mg by mouth 4 (four) times daily as needed for anxiety.     [provider]  amLODipine (NORVASC) 10 MG tablet Take 10 mg by mouth daily.    [provider]  aspirin EC 325 MG tablet Take 325 mg by mouth daily.    [provider]  clopidogrel (PLAVIX) 75 MG tablet TAKE 1  TABLET ONCE DAILY. 12/13/16   Satira Sark, MD  fluticasone (CUTIVATE) 0.05 % cream Apply 1 application topically 2 (two) times daily.    [provider]  HYDROcodone-acetaminophen (NORCO/VICODIN) 5-325 MG tablet Take 1-2 tablets by mouth every 4 (four) hours as needed. 10/07/16   Nat Christen, MD  isosorbide mononitrate (IMDUR) 60 MG 24 hr tablet Take 60 mg by mouth 2 (two) times daily.     [provider]  losartan (COZAAR) 100 MG tablet TAKE (1) TABLET BY MOUTH ONCE DAILY. 02/08/16   Satira Sark, MD  nitroGLYCERIN (NITROSTAT) 0.4 MG SL tablet Place 0.4 mg under the tongue every 5 (five) minutes as needed. For chest pain    [provider]  predniSONE (DELTASONE) 10 MG tablet Take 2 tablets (20 mg total) by mouth daily. 10/07/16   Nat Christen, MD  spironolactone (ALDACTONE) 25 MG tablet Take 1 tablet (25 mg total) by mouth daily. 05/04/14   Satira Sark, MD  traMADol (ULTRAM) 50 MG tablet Take 50-100 mg by mouth 4 (four) times daily. For pain    [provider]    Family History Family History  Problem Relation Age of Onset  . Lung cancer Father   . Breast cancer Maternal Aunt     Social History Social History   Tobacco Use  . Smoking status: Current Some Day Smoker    Packs/day: 0.75    Years: 30.00    Pack years: 22.50    Types: Cigarettes    Start date: 06/08/1982    Last attempt to quit: 09/28/2014    Years since quitting: 2.8  . Smokeless tobacco: Never Used  . Tobacco comment: 1 pack every 1 1/2 days   Substance Use Topics  . Alcohol use: No    Alcohol/week: 0.0 oz    Comment: rarely  . Drug use: No     Allergies   Contrast media [iodinated diagnostic agents]; Ibuprofen; and Nsaids   Review of Systems Review of Systems  All other systems reviewed and are negative.    Physical Exam Updated Vital Signs BP (!) 151/66   Pulse 74   Temp 98.3 F (36.8 C) (Oral)   Resp (!) 21   Ht 1.803 m (5\' 11" )   Wt 108.9 kg  (240 lb)   SpO2 96%   BMI 33.47 kg/m   Physical Exam  Constitutional: He appears well-developed and well-nourished. No distress.  HENT:  Head: Normocephalic and atraumatic.  Right Ear: External ear normal.  Left Ear: External ear normal.  Eyes: Conjunctivae are normal. Right eye exhibits no discharge. Left eye exhibits no discharge. No scleral icterus.  Neck: Neck supple. No tracheal deviation present.  Cardiovascular: Normal rate, regular rhythm and intact distal pulses.  Pulmonary/Chest: Effort normal and breath sounds normal. No stridor. No respiratory distress. He has no wheezes. He has no  rales.  Abdominal: Soft. Bowel sounds are normal. He exhibits no distension. There is no tenderness. There is no rebound and no guarding.  Musculoskeletal: He exhibits no edema or tenderness.       Right lower leg: He exhibits no tenderness and no edema.       Left lower leg: He exhibits no tenderness and no edema.  Neurological: He is alert. No cranial nerve deficit (no facial droop, extraocular movements intact, no slurred speech) or sensory deficit. He exhibits normal muscle tone. He displays no seizure activity. Coordination normal.  Partial left-sided hemiparesis, patient is able to move his left leg but limited movement of his left arm  Skin: Skin is warm and dry. No rash noted.  Psychiatric: He has a normal mood and affect.  Nursing note and vitals reviewed.    ED Treatments / Results  Labs (all labs ordered are listed, but only abnormal results are displayed) Labs Reviewed  BASIC METABOLIC PANEL - Abnormal; Notable for the following components:      Result Value   Potassium 3.3 (*)    Glucose, Bld 119 (*)    BUN 22 (*)    All other components within normal limits  CBC WITH DIFFERENTIAL/PLATELET - Abnormal; Notable for the following components:   WBC 13.4 (*)    Neutro Abs 9.3 (*)    All other components within normal limits  BRAIN NATRIURETIC PEPTIDE - Abnormal; Notable for the  following components:   B Natriuretic Peptide 138.0 (*)    All other components within normal limits  TROPONIN I  TROPONIN I    EKG  EKG Interpretation  Date/Time:  Monday July 28 2017 18:59:29 EST Ventricular Rate:  86 PR Interval:  130 QRS Duration: 92 QT Interval:  376 QTC Calculation: 449 R Axis:   16 Text Interpretation:  Normal sinus rhythm Anteroseptal infarct , age undetermined inferior t wave changes resolved compared to ECG 2015  Abnormal ECG Confirmed by Dorie Rank [phone]) on 07/28/2017 8:09:29 PM       Radiology Dg Chest 2 View  Result Date: 07/28/2017 CLINICAL DATA:  Chest pain. EXAM: CHEST  2 VIEW COMPARISON:  06/11/2017 FINDINGS: Grossly unchanged cardiac silhouette and mediastinal contours post median sternotomy and CABG. No focal airspace opacities. No pleural effusion or pneumothorax. No evidence of edema. No acute osseus abnormalities. Stigmata of DISH within the thoracic spine. IMPRESSION: Cardiomegaly without superimposed acute cardiopulmonary disease. Electronically Signed   By: Sandi Mariscal M.D.   On: 07/28/2017 19:35    Procedures Procedures (including critical care time)  Medications Ordered in ED Medications - No data to display   Initial Impression / Assessment and Plan / ED Course  I have reviewed the triage vital signs and the nursing notes.  Pertinent labs & imaging results that were available during my care of the patient were reviewed by me and considered in my medical decision making (see chart for details).  Clinical Course as of Jul 28 2199  Mon Jul 28, 2017  2024 Medical records reviewed.  [facility]  He was admitted for chest pain.  Serial cardiac enzymes were negative. recent stress test on 12/5 showed no reversible ischemia  [JK]    Clinical Course User Index [JK] Dorie Rank, MD    Pt presented with cp.  Known history of CAD however recently hospitalized with chest pain, negative  workup and had a stress test that was normal in December.  Initial labs and EKG reassuring.  Will plan on delta troponin.   If normal, will have pt follow up with his cardiologist.  Final Clinical Impressions(s) / ED Diagnoses   Final diagnoses:  Chest pain, unspecified type    ED Discharge Orders    None       Dorie Rank, MD 07/28/17 2201

## 2017-07-28 NOTE — Discharge Instructions (Signed)
Follow-up with your cardiologist for additional outpatient testing.

## 2017-07-29 DIAGNOSIS — E559 Vitamin D deficiency, unspecified: Secondary | ICD-10-CM | POA: Diagnosis not present

## 2017-07-29 DIAGNOSIS — I63311 Cerebral infarction due to thrombosis of right middle cerebral artery: Secondary | ICD-10-CM | POA: Diagnosis not present

## 2017-07-29 DIAGNOSIS — E1100 Type 2 diabetes mellitus with hyperosmolarity without nonketotic hyperglycemic-hyperosmolar coma (NKHHC): Secondary | ICD-10-CM | POA: Diagnosis not present

## 2017-07-29 DIAGNOSIS — I5023 Acute on chronic systolic (congestive) heart failure: Secondary | ICD-10-CM | POA: Diagnosis not present

## 2017-07-29 DIAGNOSIS — E538 Deficiency of other specified B group vitamins: Secondary | ICD-10-CM | POA: Diagnosis not present

## 2017-07-29 DIAGNOSIS — I10 Essential (primary) hypertension: Secondary | ICD-10-CM | POA: Diagnosis not present

## 2017-07-30 DIAGNOSIS — I255 Ischemic cardiomyopathy: Secondary | ICD-10-CM | POA: Diagnosis not present

## 2017-07-30 DIAGNOSIS — I5023 Acute on chronic systolic (congestive) heart failure: Secondary | ICD-10-CM | POA: Diagnosis not present

## 2017-07-30 DIAGNOSIS — I2510 Atherosclerotic heart disease of native coronary artery without angina pectoris: Secondary | ICD-10-CM | POA: Diagnosis not present

## 2017-07-30 DIAGNOSIS — I110 Hypertensive heart disease with heart failure: Secondary | ICD-10-CM | POA: Diagnosis not present

## 2017-07-30 DIAGNOSIS — I69354 Hemiplegia and hemiparesis following cerebral infarction affecting left non-dominant side: Secondary | ICD-10-CM | POA: Diagnosis not present

## 2017-07-30 DIAGNOSIS — E119 Type 2 diabetes mellitus without complications: Secondary | ICD-10-CM | POA: Diagnosis not present

## 2017-07-31 ENCOUNTER — Encounter: Payer: Self-pay | Admitting: Neurology

## 2017-07-31 DIAGNOSIS — I110 Hypertensive heart disease with heart failure: Secondary | ICD-10-CM | POA: Diagnosis not present

## 2017-07-31 DIAGNOSIS — I255 Ischemic cardiomyopathy: Secondary | ICD-10-CM | POA: Diagnosis not present

## 2017-07-31 DIAGNOSIS — E119 Type 2 diabetes mellitus without complications: Secondary | ICD-10-CM | POA: Diagnosis not present

## 2017-07-31 DIAGNOSIS — I5023 Acute on chronic systolic (congestive) heart failure: Secondary | ICD-10-CM | POA: Diagnosis not present

## 2017-07-31 DIAGNOSIS — I69354 Hemiplegia and hemiparesis following cerebral infarction affecting left non-dominant side: Secondary | ICD-10-CM | POA: Diagnosis not present

## 2017-07-31 DIAGNOSIS — I2510 Atherosclerotic heart disease of native coronary artery without angina pectoris: Secondary | ICD-10-CM | POA: Diagnosis not present

## 2017-08-01 DIAGNOSIS — I110 Hypertensive heart disease with heart failure: Secondary | ICD-10-CM | POA: Diagnosis not present

## 2017-08-01 DIAGNOSIS — I5023 Acute on chronic systolic (congestive) heart failure: Secondary | ICD-10-CM | POA: Diagnosis not present

## 2017-08-01 DIAGNOSIS — E119 Type 2 diabetes mellitus without complications: Secondary | ICD-10-CM | POA: Diagnosis not present

## 2017-08-01 DIAGNOSIS — I2510 Atherosclerotic heart disease of native coronary artery without angina pectoris: Secondary | ICD-10-CM | POA: Diagnosis not present

## 2017-08-01 DIAGNOSIS — I255 Ischemic cardiomyopathy: Secondary | ICD-10-CM | POA: Diagnosis not present

## 2017-08-01 DIAGNOSIS — I69354 Hemiplegia and hemiparesis following cerebral infarction affecting left non-dominant side: Secondary | ICD-10-CM | POA: Diagnosis not present

## 2017-08-04 DIAGNOSIS — E119 Type 2 diabetes mellitus without complications: Secondary | ICD-10-CM | POA: Diagnosis not present

## 2017-08-04 DIAGNOSIS — I5023 Acute on chronic systolic (congestive) heart failure: Secondary | ICD-10-CM | POA: Diagnosis not present

## 2017-08-04 DIAGNOSIS — I255 Ischemic cardiomyopathy: Secondary | ICD-10-CM | POA: Diagnosis not present

## 2017-08-04 DIAGNOSIS — I69354 Hemiplegia and hemiparesis following cerebral infarction affecting left non-dominant side: Secondary | ICD-10-CM | POA: Diagnosis not present

## 2017-08-04 DIAGNOSIS — I110 Hypertensive heart disease with heart failure: Secondary | ICD-10-CM | POA: Diagnosis not present

## 2017-08-04 DIAGNOSIS — I2510 Atherosclerotic heart disease of native coronary artery without angina pectoris: Secondary | ICD-10-CM | POA: Diagnosis not present

## 2017-08-05 DIAGNOSIS — I5023 Acute on chronic systolic (congestive) heart failure: Secondary | ICD-10-CM | POA: Diagnosis not present

## 2017-08-05 DIAGNOSIS — I2510 Atherosclerotic heart disease of native coronary artery without angina pectoris: Secondary | ICD-10-CM | POA: Diagnosis not present

## 2017-08-05 DIAGNOSIS — I110 Hypertensive heart disease with heart failure: Secondary | ICD-10-CM | POA: Diagnosis not present

## 2017-08-05 DIAGNOSIS — I69354 Hemiplegia and hemiparesis following cerebral infarction affecting left non-dominant side: Secondary | ICD-10-CM | POA: Diagnosis not present

## 2017-08-05 DIAGNOSIS — E119 Type 2 diabetes mellitus without complications: Secondary | ICD-10-CM | POA: Diagnosis not present

## 2017-08-05 DIAGNOSIS — I255 Ischemic cardiomyopathy: Secondary | ICD-10-CM | POA: Diagnosis not present

## 2017-08-06 DIAGNOSIS — E119 Type 2 diabetes mellitus without complications: Secondary | ICD-10-CM | POA: Diagnosis not present

## 2017-08-06 DIAGNOSIS — I110 Hypertensive heart disease with heart failure: Secondary | ICD-10-CM | POA: Diagnosis not present

## 2017-08-06 DIAGNOSIS — I2510 Atherosclerotic heart disease of native coronary artery without angina pectoris: Secondary | ICD-10-CM | POA: Diagnosis not present

## 2017-08-06 DIAGNOSIS — I69354 Hemiplegia and hemiparesis following cerebral infarction affecting left non-dominant side: Secondary | ICD-10-CM | POA: Diagnosis not present

## 2017-08-06 DIAGNOSIS — I5023 Acute on chronic systolic (congestive) heart failure: Secondary | ICD-10-CM | POA: Diagnosis not present

## 2017-08-06 DIAGNOSIS — I255 Ischemic cardiomyopathy: Secondary | ICD-10-CM | POA: Diagnosis not present

## 2017-08-07 DIAGNOSIS — I5023 Acute on chronic systolic (congestive) heart failure: Secondary | ICD-10-CM | POA: Diagnosis not present

## 2017-08-07 DIAGNOSIS — I110 Hypertensive heart disease with heart failure: Secondary | ICD-10-CM | POA: Diagnosis not present

## 2017-08-07 DIAGNOSIS — I2510 Atherosclerotic heart disease of native coronary artery without angina pectoris: Secondary | ICD-10-CM | POA: Diagnosis not present

## 2017-08-07 DIAGNOSIS — E119 Type 2 diabetes mellitus without complications: Secondary | ICD-10-CM | POA: Diagnosis not present

## 2017-08-07 DIAGNOSIS — I255 Ischemic cardiomyopathy: Secondary | ICD-10-CM | POA: Diagnosis not present

## 2017-08-07 DIAGNOSIS — I69354 Hemiplegia and hemiparesis following cerebral infarction affecting left non-dominant side: Secondary | ICD-10-CM | POA: Diagnosis not present

## 2017-08-08 DIAGNOSIS — I255 Ischemic cardiomyopathy: Secondary | ICD-10-CM | POA: Diagnosis not present

## 2017-08-08 DIAGNOSIS — I110 Hypertensive heart disease with heart failure: Secondary | ICD-10-CM | POA: Diagnosis not present

## 2017-08-08 DIAGNOSIS — E119 Type 2 diabetes mellitus without complications: Secondary | ICD-10-CM | POA: Diagnosis not present

## 2017-08-08 DIAGNOSIS — I5023 Acute on chronic systolic (congestive) heart failure: Secondary | ICD-10-CM | POA: Diagnosis not present

## 2017-08-08 DIAGNOSIS — I69354 Hemiplegia and hemiparesis following cerebral infarction affecting left non-dominant side: Secondary | ICD-10-CM | POA: Diagnosis not present

## 2017-08-08 DIAGNOSIS — I2510 Atherosclerotic heart disease of native coronary artery without angina pectoris: Secondary | ICD-10-CM | POA: Diagnosis not present

## 2017-08-09 DIAGNOSIS — I5023 Acute on chronic systolic (congestive) heart failure: Secondary | ICD-10-CM | POA: Diagnosis not present

## 2017-08-09 DIAGNOSIS — Z6831 Body mass index (BMI) 31.0-31.9, adult: Secondary | ICD-10-CM | POA: Diagnosis not present

## 2017-08-09 DIAGNOSIS — G8102 Flaccid hemiplegia affecting left dominant side: Secondary | ICD-10-CM | POA: Diagnosis not present

## 2017-08-11 DIAGNOSIS — I2510 Atherosclerotic heart disease of native coronary artery without angina pectoris: Secondary | ICD-10-CM | POA: Diagnosis not present

## 2017-08-11 DIAGNOSIS — E119 Type 2 diabetes mellitus without complications: Secondary | ICD-10-CM | POA: Diagnosis not present

## 2017-08-11 DIAGNOSIS — I110 Hypertensive heart disease with heart failure: Secondary | ICD-10-CM | POA: Diagnosis not present

## 2017-08-11 DIAGNOSIS — I5023 Acute on chronic systolic (congestive) heart failure: Secondary | ICD-10-CM | POA: Diagnosis not present

## 2017-08-11 DIAGNOSIS — I255 Ischemic cardiomyopathy: Secondary | ICD-10-CM | POA: Diagnosis not present

## 2017-08-11 DIAGNOSIS — I69354 Hemiplegia and hemiparesis following cerebral infarction affecting left non-dominant side: Secondary | ICD-10-CM | POA: Diagnosis not present

## 2017-08-12 DIAGNOSIS — I110 Hypertensive heart disease with heart failure: Secondary | ICD-10-CM | POA: Diagnosis not present

## 2017-08-12 DIAGNOSIS — I69354 Hemiplegia and hemiparesis following cerebral infarction affecting left non-dominant side: Secondary | ICD-10-CM | POA: Diagnosis not present

## 2017-08-12 DIAGNOSIS — I5023 Acute on chronic systolic (congestive) heart failure: Secondary | ICD-10-CM | POA: Diagnosis not present

## 2017-08-12 DIAGNOSIS — I255 Ischemic cardiomyopathy: Secondary | ICD-10-CM | POA: Diagnosis not present

## 2017-08-12 DIAGNOSIS — I2510 Atherosclerotic heart disease of native coronary artery without angina pectoris: Secondary | ICD-10-CM | POA: Diagnosis not present

## 2017-08-12 DIAGNOSIS — E119 Type 2 diabetes mellitus without complications: Secondary | ICD-10-CM | POA: Diagnosis not present

## 2017-08-13 DIAGNOSIS — I2510 Atherosclerotic heart disease of native coronary artery without angina pectoris: Secondary | ICD-10-CM | POA: Diagnosis not present

## 2017-08-13 DIAGNOSIS — I110 Hypertensive heart disease with heart failure: Secondary | ICD-10-CM | POA: Diagnosis not present

## 2017-08-13 DIAGNOSIS — E8881 Metabolic syndrome: Secondary | ICD-10-CM | POA: Diagnosis not present

## 2017-08-13 DIAGNOSIS — F17210 Nicotine dependence, cigarettes, uncomplicated: Secondary | ICD-10-CM | POA: Diagnosis not present

## 2017-08-13 DIAGNOSIS — E119 Type 2 diabetes mellitus without complications: Secondary | ICD-10-CM | POA: Diagnosis not present

## 2017-08-13 DIAGNOSIS — Z6834 Body mass index (BMI) 34.0-34.9, adult: Secondary | ICD-10-CM | POA: Diagnosis not present

## 2017-08-13 DIAGNOSIS — I5023 Acute on chronic systolic (congestive) heart failure: Secondary | ICD-10-CM | POA: Diagnosis not present

## 2017-08-13 DIAGNOSIS — I255 Ischemic cardiomyopathy: Secondary | ICD-10-CM | POA: Diagnosis not present

## 2017-08-13 DIAGNOSIS — I10 Essential (primary) hypertension: Secondary | ICD-10-CM | POA: Diagnosis not present

## 2017-08-13 DIAGNOSIS — E782 Mixed hyperlipidemia: Secondary | ICD-10-CM | POA: Diagnosis not present

## 2017-08-13 DIAGNOSIS — I69354 Hemiplegia and hemiparesis following cerebral infarction affecting left non-dominant side: Secondary | ICD-10-CM | POA: Diagnosis not present

## 2017-08-13 DIAGNOSIS — G6289 Other specified polyneuropathies: Secondary | ICD-10-CM | POA: Diagnosis not present

## 2017-08-14 DIAGNOSIS — I110 Hypertensive heart disease with heart failure: Secondary | ICD-10-CM | POA: Diagnosis not present

## 2017-08-14 DIAGNOSIS — I69354 Hemiplegia and hemiparesis following cerebral infarction affecting left non-dominant side: Secondary | ICD-10-CM | POA: Diagnosis not present

## 2017-08-14 DIAGNOSIS — I5023 Acute on chronic systolic (congestive) heart failure: Secondary | ICD-10-CM | POA: Diagnosis not present

## 2017-08-14 DIAGNOSIS — I255 Ischemic cardiomyopathy: Secondary | ICD-10-CM | POA: Diagnosis not present

## 2017-08-14 DIAGNOSIS — I2510 Atherosclerotic heart disease of native coronary artery without angina pectoris: Secondary | ICD-10-CM | POA: Diagnosis not present

## 2017-08-14 DIAGNOSIS — E119 Type 2 diabetes mellitus without complications: Secondary | ICD-10-CM | POA: Diagnosis not present

## 2017-08-18 DIAGNOSIS — I2510 Atherosclerotic heart disease of native coronary artery without angina pectoris: Secondary | ICD-10-CM | POA: Diagnosis not present

## 2017-08-18 DIAGNOSIS — I110 Hypertensive heart disease with heart failure: Secondary | ICD-10-CM | POA: Diagnosis not present

## 2017-08-18 DIAGNOSIS — I69354 Hemiplegia and hemiparesis following cerebral infarction affecting left non-dominant side: Secondary | ICD-10-CM | POA: Diagnosis not present

## 2017-08-18 DIAGNOSIS — I255 Ischemic cardiomyopathy: Secondary | ICD-10-CM | POA: Diagnosis not present

## 2017-08-18 DIAGNOSIS — I5023 Acute on chronic systolic (congestive) heart failure: Secondary | ICD-10-CM | POA: Diagnosis not present

## 2017-08-18 DIAGNOSIS — E119 Type 2 diabetes mellitus without complications: Secondary | ICD-10-CM | POA: Diagnosis not present

## 2017-08-19 DIAGNOSIS — E119 Type 2 diabetes mellitus without complications: Secondary | ICD-10-CM | POA: Diagnosis not present

## 2017-08-19 DIAGNOSIS — I69354 Hemiplegia and hemiparesis following cerebral infarction affecting left non-dominant side: Secondary | ICD-10-CM | POA: Diagnosis not present

## 2017-08-19 DIAGNOSIS — I2510 Atherosclerotic heart disease of native coronary artery without angina pectoris: Secondary | ICD-10-CM | POA: Diagnosis not present

## 2017-08-19 DIAGNOSIS — I255 Ischemic cardiomyopathy: Secondary | ICD-10-CM | POA: Diagnosis not present

## 2017-08-19 DIAGNOSIS — I5023 Acute on chronic systolic (congestive) heart failure: Secondary | ICD-10-CM | POA: Diagnosis not present

## 2017-08-19 DIAGNOSIS — I110 Hypertensive heart disease with heart failure: Secondary | ICD-10-CM | POA: Diagnosis not present

## 2017-08-20 DIAGNOSIS — I5023 Acute on chronic systolic (congestive) heart failure: Secondary | ICD-10-CM | POA: Diagnosis not present

## 2017-08-20 DIAGNOSIS — I2510 Atherosclerotic heart disease of native coronary artery without angina pectoris: Secondary | ICD-10-CM | POA: Diagnosis not present

## 2017-08-20 DIAGNOSIS — I69354 Hemiplegia and hemiparesis following cerebral infarction affecting left non-dominant side: Secondary | ICD-10-CM | POA: Diagnosis not present

## 2017-08-20 DIAGNOSIS — E119 Type 2 diabetes mellitus without complications: Secondary | ICD-10-CM | POA: Diagnosis not present

## 2017-08-20 DIAGNOSIS — I255 Ischemic cardiomyopathy: Secondary | ICD-10-CM | POA: Diagnosis not present

## 2017-08-20 DIAGNOSIS — I110 Hypertensive heart disease with heart failure: Secondary | ICD-10-CM | POA: Diagnosis not present

## 2017-08-21 DIAGNOSIS — M545 Low back pain: Secondary | ICD-10-CM | POA: Diagnosis not present

## 2017-08-21 DIAGNOSIS — E119 Type 2 diabetes mellitus without complications: Secondary | ICD-10-CM | POA: Diagnosis not present

## 2017-08-21 DIAGNOSIS — Z801 Family history of malignant neoplasm of trachea, bronchus and lung: Secondary | ICD-10-CM | POA: Diagnosis not present

## 2017-08-21 DIAGNOSIS — R109 Unspecified abdominal pain: Secondary | ICD-10-CM | POA: Diagnosis not present

## 2017-08-21 DIAGNOSIS — I2510 Atherosclerotic heart disease of native coronary artery without angina pectoris: Secondary | ICD-10-CM | POA: Diagnosis not present

## 2017-08-21 DIAGNOSIS — I69354 Hemiplegia and hemiparesis following cerebral infarction affecting left non-dominant side: Secondary | ICD-10-CM | POA: Diagnosis not present

## 2017-08-21 DIAGNOSIS — I5023 Acute on chronic systolic (congestive) heart failure: Secondary | ICD-10-CM | POA: Diagnosis not present

## 2017-08-21 DIAGNOSIS — Z803 Family history of malignant neoplasm of breast: Secondary | ICD-10-CM | POA: Diagnosis not present

## 2017-08-21 DIAGNOSIS — I110 Hypertensive heart disease with heart failure: Secondary | ICD-10-CM | POA: Diagnosis not present

## 2017-08-21 DIAGNOSIS — I255 Ischemic cardiomyopathy: Secondary | ICD-10-CM | POA: Diagnosis not present

## 2017-08-22 DIAGNOSIS — I2510 Atherosclerotic heart disease of native coronary artery without angina pectoris: Secondary | ICD-10-CM | POA: Diagnosis not present

## 2017-08-22 DIAGNOSIS — E119 Type 2 diabetes mellitus without complications: Secondary | ICD-10-CM | POA: Diagnosis not present

## 2017-08-22 DIAGNOSIS — I255 Ischemic cardiomyopathy: Secondary | ICD-10-CM | POA: Diagnosis not present

## 2017-08-22 DIAGNOSIS — I69354 Hemiplegia and hemiparesis following cerebral infarction affecting left non-dominant side: Secondary | ICD-10-CM | POA: Diagnosis not present

## 2017-08-22 DIAGNOSIS — I110 Hypertensive heart disease with heart failure: Secondary | ICD-10-CM | POA: Diagnosis not present

## 2017-08-22 DIAGNOSIS — I5023 Acute on chronic systolic (congestive) heart failure: Secondary | ICD-10-CM | POA: Diagnosis not present

## 2017-08-26 DIAGNOSIS — E119 Type 2 diabetes mellitus without complications: Secondary | ICD-10-CM | POA: Diagnosis not present

## 2017-08-26 DIAGNOSIS — I2510 Atherosclerotic heart disease of native coronary artery without angina pectoris: Secondary | ICD-10-CM | POA: Diagnosis not present

## 2017-08-26 DIAGNOSIS — I110 Hypertensive heart disease with heart failure: Secondary | ICD-10-CM | POA: Diagnosis not present

## 2017-08-26 DIAGNOSIS — I69354 Hemiplegia and hemiparesis following cerebral infarction affecting left non-dominant side: Secondary | ICD-10-CM | POA: Diagnosis not present

## 2017-08-26 DIAGNOSIS — I5023 Acute on chronic systolic (congestive) heart failure: Secondary | ICD-10-CM | POA: Diagnosis not present

## 2017-08-26 DIAGNOSIS — I255 Ischemic cardiomyopathy: Secondary | ICD-10-CM | POA: Diagnosis not present

## 2017-08-27 ENCOUNTER — Encounter: Payer: Self-pay | Admitting: *Deleted

## 2017-08-27 ENCOUNTER — Encounter: Payer: Self-pay | Admitting: Neurology

## 2017-08-27 ENCOUNTER — Ambulatory Visit (INDEPENDENT_AMBULATORY_CARE_PROVIDER_SITE_OTHER): Payer: Medicare Other | Admitting: Neurology

## 2017-08-27 VITALS — BP 134/52 | HR 77 | Ht 71.0 in | Wt 252.0 lb

## 2017-08-27 DIAGNOSIS — I429 Cardiomyopathy, unspecified: Secondary | ICD-10-CM | POA: Diagnosis not present

## 2017-08-27 DIAGNOSIS — I255 Ischemic cardiomyopathy: Secondary | ICD-10-CM

## 2017-08-27 DIAGNOSIS — Z794 Long term (current) use of insulin: Secondary | ICD-10-CM

## 2017-08-27 DIAGNOSIS — E785 Hyperlipidemia, unspecified: Secondary | ICD-10-CM

## 2017-08-27 DIAGNOSIS — E118 Type 2 diabetes mellitus with unspecified complications: Secondary | ICD-10-CM | POA: Diagnosis not present

## 2017-08-27 DIAGNOSIS — F17200 Nicotine dependence, unspecified, uncomplicated: Secondary | ICD-10-CM

## 2017-08-27 DIAGNOSIS — I10 Essential (primary) hypertension: Secondary | ICD-10-CM

## 2017-08-27 DIAGNOSIS — I69354 Hemiplegia and hemiparesis following cerebral infarction affecting left non-dominant side: Secondary | ICD-10-CM | POA: Diagnosis not present

## 2017-08-27 MED ORDER — BACLOFEN 10 MG PO TABS: 10.0000 mg | ORAL_TABLET | Freq: Three times a day (TID) | ORAL | 1 refills | Status: DC | PRN

## 2017-08-27 NOTE — Patient Instructions (Addendum)
1.  Continue ASA and Plavix 2.  Continue blood pressure and cholesterol medication 3.  Continue physical therapy and occupational therapy. 4.  For muscle spasms, I will prescribe you baclofen 10mg  three times daily as needed. 5.  I will refer you to physical medicine and rehabilitation 6.  Continue gabapentin  7.  I will order you an AFO (orthotic) for your left foot. 8.  Follow up in 3 months.

## 2017-08-27 NOTE — Progress Notes (Signed)
Cardiology Office Note  Date: 08/28/2017   ID: [name], DOB 07-05-1966, MRN [id number]  PCP: Caryl Bis, MD  Primary Cardiologist: Rozann Lesches, MD   Chief Complaint  Patient presents with  . Coronary Artery Disease  . Cardiomyopathy    History of Present Illness: [name] is a medically complex 52 y.o. male last seen in July 2018.  I reviewed extensive interval records.  He was admitted to [facility] in December 2018 with right MCA stroke, was treated with tPA.  Brain MRI showed a right basal ganglia acute infarct with evidence of old right frontal infarct.  Subsequent imaging showed no evidence of significant ICA stenoses.  He was found to have an LVEF in the 25-30% range but no interatrial communication.  Cardiology was consulted and he underwent a Myoview showing evidence of previous large anterior and periapical infarct which was felt to be best managed medically.  He had a follow-up visit with Neurology yesterday - I reviewed the note.  He is here today with his wife who is a Marine scientist.  He is in a wheelchair.  Still has left-sided weakness following his stroke and is undergoing physical therapy at home.  He has also had swelling in his left leg which I suspect is secondary to his stroke and effects on autonomic nervous system.  He does not report any pain in his left leg.  Recent cardiac testing is reviewed below.  He does not indicate any angina symptoms or nitroglycerin use.  We went over his medications and discussed initiating low-dose Entresto with reduction in Norvasc.  Assessment of his LVEF at [facility] varied significantly depending on test modality.  We will ultimately need to repeat an echocardiogram.  I would not push to put him through a cardiac catheterization at this time however in the absence of angina and in the setting of a recent stroke as well as previous complications with intravenous contrast.  Myoview study at [facility] showed scar  without large ischemic territories.  Past Medical History:  Diagnosis Date  . Anaphylaxis    IGE mediated  . Anxiety   . Arthritis   . Cardiomyopathy ([place])   . CHF (congestive heart failure) ([place])   . COPD (chronic obstructive pulmonary disease) ([place])   . Coronary atherosclerosis of native coronary artery    Multivessel status post CABG 2010  . Depression   . Essential hypertension, benign   . History of CVA (cerebrovascular accident) 01/2012   Right posterior frontal cortical and subcortical brain by MRI, no hemorrhage. Carotid Dopplers showed only 1-50% bilateral ICA stenoses. Echocardiogram showed LVEF 50%, no major valvular abnormalities.  . Mixed hyperlipidemia   . Myocardial infarction ([place]) 2010  . OSA (obstructive sleep apnea)   . Stroke ([place])   . Type 2 diabetes mellitus ([place])     Past Surgical History:  Procedure Laterality Date  . CHOLECYSTECTOMY    . CORONARY ARTERY BYPASS GRAFT  2010   LIMA to LAD, SVG to diagonal, SVG to OM1 and OM 2, SVG to RCA  . DENTAL SURGERY  2003  . GASTRIC BYPASS  2010  . HERNIA REPAIR  2011, 2012  . INCISIONAL HERNIA REPAIR N/A 07/23/2013   Procedure: HERNIA REPAIR INCISIONAL WITH MESH;  Surgeon: Jamesetta So, MD;  Location: [facility];  Service: General;  Laterality: N/A;  . INSERTION OF MESH N/A 07/23/2013   Procedure: INSERTION OF MESH;  Surgeon: Jamesetta So, MD;  Location: [facility];  Service: General;  Laterality: N/A;  . LEFT HEART CATHETERIZATION WITH CORONARY/GRAFT ANGIOGRAM N/A 11/01/2013   Procedure: LEFT HEART CATHETERIZATION WITH Beatrix Fetters;  Surgeon: Blane Ohara, MD;  Location: [facility] CATH LAB;  Service: Cardiovascular;  Laterality: N/A;  . TOE AMPUTATION  1998   right 1st and 2nd toe  . TONSILECTOMY, ADENOIDECTOMY, BILATERAL MYRINGOTOMY AND TUBES    . TONSILLECTOMY    . VENTRAL HERNIA REPAIR N/A 10/28/2012   Procedure: LAPAROSCOPIC VENTRAL HERNIA;  Surgeon: Donato Heinz, MD;  Location: [facility];  Service: General;   Laterality: N/A;    Current Outpatient Medications  Medication Sig Dispense Refill  . albuterol (PROVENTIL HFA;VENTOLIN HFA) 108 (90 BASE) MCG/ACT inhaler Inhale 2 puffs into the lungs every 6 (six) hours as needed for wheezing or shortness of breath.    . ALPRAZolam (XANAX) 1 MG tablet Take 1 mg by mouth 3 (three) times daily as needed for anxiety.     [name] aspirin EC 325 MG tablet Take 325 mg by mouth daily.    [name] atorvastatin (LIPITOR) 20 MG tablet Take 40 mg by mouth every evening.     . baclofen (LIORESAL) 10 MG tablet Take 1 tablet (10 mg total) by mouth 3 (three) times daily as needed for muscle spasms. 90 each 1  . carvedilol (COREG) 6.25 MG tablet Take 6.25 mg by mouth 2 (two) times daily with a meal.    . Cholecalciferol (VITAMIN D) 2000 units tablet Take 2,000 Units by mouth daily at 12 noon.    . clopidogrel (PLAVIX) 75 MG tablet TAKE 1 TABLET ONCE DAILY. 30 tablet 6  . DULoxetine (CYMBALTA) 60 MG capsule Take 60 mg by mouth daily.    . fenofibrate micronized (LOFIBRA) 134 MG capsule Take 134 mg by mouth daily before breakfast.     . fluticasone (CUTIVATE) 0.05 % cream Apply 1 application topically 2 (two) times daily.    [name] gabapentin (NEURONTIN) 300 MG capsule Take 300 mg by mouth 2 (two) times daily. 600mg  QHS    . isosorbide mononitrate (IMDUR) 60 MG 24 hr tablet Take 60 mg by mouth 2 (two) times daily.     . nitroGLYCERIN (NITROSTAT) 0.4 MG SL tablet Place 0.4 mg under the tongue every 5 (five) minutes as needed. For chest pain    . pantoprazole (PROTONIX) 40 MG tablet Take 40 mg by mouth daily.    [name] PARoxetine (PAXIL) 20 MG tablet Take 40 mg by mouth daily.     . traMADol (ULTRAM) 50 MG tablet Take 50 mg by mouth every 6 (six) hours as needed.    . vitamin B-12 (CYANOCOBALAMIN) 1000 MCG tablet Take 1,000 mcg by mouth daily at 12 noon.    [name] amLODipine (NORVASC) 5 MG tablet Take 1 tablet (5 mg total) by mouth daily. 30 tablet 1  . sacubitril-valsartan (ENTRESTO) 24-26 MG Take 1  tablet by mouth 2 (two) times daily. 60 tablet 1   No current facility-administered medications for this visit.    Allergies:  Contrast media [iodinated diagnostic agents]; Ibuprofen; and Nsaids   Social History: The patient  reports that he has quit smoking. His smoking use included cigarettes. He started smoking about 35 years ago. He has a 22.50 pack-year smoking history. he has never used smokeless tobacco. He reports that he does not drink alcohol or use drugs.   Family History: The patient's family history includes Breast cancer in his maternal aunt; Congestive Heart Failure in his mother; Diabetes in his mother;  Heart disease in his father and mother; Hyperlipidemia in his mother; Lung cancer in his father.   ROS:  Please see the history of present illness. Otherwise, complete review of systems is positive for fatigue, chronic pain.  All other systems are reviewed and negative.   Physical Exam: VS:  BP (!) 144/79 (BP Location: Right Arm)   Pulse 81   Ht 5\' 11"  (1.803 m)   Wt 256 lb (116.1 kg)   SpO2 98%   BMI 35.70 kg/m , BMI Body mass index is 35.7 kg/m.  Wt Readings from Last 3 Encounters:  08/28/17 256 lb (116.1 kg)  08/27/17 252 lb (114.3 kg)  07/28/17 240 lb (108.9 kg)    General: Overweight male seated in a wheelchair. HEENT: Conjunctiva and lids normal, oropharynx clear with moist mucosa. Neck: Supple, no elevated JVP or carotid bruits, no thyromegaly. Lungs: Clear to auscultation, nonlabored breathing at rest. Cardiac: Regular rate and rhythm, no S3 or significant systolic murmur, no pericardial rub. Abdomen: Soft, nontender, bowel sounds present. Extremities: 2+ left lower leg edema, distal pulses 2+. Skin: Warm and dry. Musculoskeletal: No kyphosis. Neuropsychiatric: Alert and oriented x3, affect grossly appropriate.  Left-sided hemiparesis.  ECG: I personally reviewed the tracing from 07/28/2017 which showed sinus rhythm with rightward axis, diffuse nonspecific  ST-T changes.  Recent Labwork: 07/28/2017: B Natriuretic Peptide 138.0; BUN 22; Creatinine, Ser 1.09; Hemoglobin 14.9; Platelets 254; Potassium 3.3; Sodium 139  December 2018: Cholesterol 132, triglycerides 144, HDL 35, LDL 68  Other Studies Reviewed Today:  Cardiac catheterization 11/01/2013: Coronary angiography: Coronary dominance:right  Left mainstem:The left main is patent without obstructive disease   Left anterior descending (LAD):The LAD is totally occluded at the proximal portion.  Left circumflex (LCx):The left circumflex is severely diseased with 80-90% proximal vessel stenosis. The obtuse marginal branches are all occluded. The left PLA branches which are very small, are patent.  Right coronary artery (RCA):The native RCA is totally occluded.  LIMA to LAD: Widely patent throughout. The native LAD is diffusely diseased but patent throughout its course. The vessel retrograde fills back through the first diagonal. The LAD wraps around the left ventricular apex without significant disease.  Saphenous vein graft to first diagonal: Widely patent without significant obstruction.  Saphenous vein graft sequential to OM1 and OM 2 is patent without significant disease. There are some areas of dilatation. The vein is particularly dilated between the OM1 and OM 2 sequence.  Saphenous vein graft to PDA: Patent throughout with no obstructive disease.  Left ventriculography: There is severe hypokinesis of the anterolateral and periapical walls. The basal anterior and basal and midinferior walls contract normally. The estimated LVEF is 35%.  Final Conclusions:  1. Severe native three-vessel coronary artery disease with total occlusion of the RCA, total occlusion of the LAD, and total occlusion of the obtuse marginal branch of the left circumflex  2. Status post aortocoronary bypass surgery with continued patency of the LIMA to LAD, saphenous vein graft to first diagonal,  sequential saphenous vein graft to OM1 and OM 2, and saphenous vein graft to the right PDA  3. Severe segmental LV systolic dysfunction  Echocardiogram 07/05/2017 [facility]): Interpretation Summary A limited portable two-dimensional transthoracic echocardiogram with color Doppler and Spectral Doppler was performed. The study was technically difficult. Definity contrast injection performed. Wall motion difficultt to assess. LV fxn is reduced. The study was technically difficult. Definity contrast injection performed. A limited portable two-dimensional transthoracic echocardiogram with color Doppler and Spectral Doppler was performed. The  left ventricle is severely dilated. There is moderate concentric left ventricular hypertrophy. There is anteroseptal and apical wall akinesis. There is moderate diffuse hypokinesis of the remaining left ventricular segments. The left ventricular ejection fraction is moderately reduced (35-40%). The left ventricular diastolic function is abnormal. The aortic valve is trileaflet. Moderate aortic sclerosis is present with good valvular opening. There is mild to moderate [1-2+] aortic regurgitation present. Wall motion difficultt to assess. LV fxn is reduced  Left Ventricle The left ventricle is severely dilated. There is moderate concentric left ventricular hypertrophy. There is anteroseptal and apical wall akinesis. There is moderate diffuse hypokinesis of the remaining left ventricular segments. The left ventricular ejection fraction is moderately reduced (35- 40%). The left ventricular diastolic function is abnormal.  Right Ventricle The right ventricle is not well visualized. The right ventricle is grossly normal in size and function.  Atria The left atrium is not well visualized. The right atrium is not well visualized.  Mitral Valve The mitral valve is not well visualized. There is trace mitral regurgitation.  Tricuspid Valve The  tricuspid valve is not well visualized, unable to adequately assess function. There is trace tricuspid regurgitation.  Aortic Valve The aortic valve is trileaflet. The aortic valve opens well. Moderate aortic sclerosis is present with good valvular opening. There is mild to moderate [1-2+] aortic regurgitation present.  Pulmonic Valve The pulmonic valve is normal in structure and function.  Vessels The aortic root is normal in diameter. The pulmonary artery is normal.  Pericardium There is no pericardial effusion.  Lexiscan Myoview 06/18/2017 [facility]): FINDINGS: Large fixed defect is present involving the apex and large portion of the periapical anterior wall. No evidence of ischemia. Wall motion analysis reveals apical dyskinesis and akinesis. Left ventricular ejection fraction is calculated to be  18%.  IMPRESSION: Large infarct involving the apex and periapical anterior wall. No evidence of ischemia. Apical akinesis and dyskinesis with ejection fraction of 18%.  Carotid Dopplers 06/15/2017 [facility]): IMPRESSION:   1.  Mild bilateral CCA bifurcation atherosclerotic plaque, but no hemodynamically significant cervical carotid stenosis on either side, by Doppler criteria.  2.  Both vertebral arteries are patent, with antegrade flow.  Assessment and Plan:  1.  Multivessel CAD status post CABG with patent bypass grafts as of 2015 although more recently documented evidence of ischemic cardiomyopathy as detailed above.  LVEF ranging from 20% to 40% based on test modality.  Myoview study indicated large region of scar in the anterior and periapical distribution but no significant ischemia.  Plan is to continue aspirin, Plavix, Coreg, and Imdur.  Norvasc will be cut from 10 mg to 5 mg daily, and we will initiate Entresto 24/26 mg twice daily.  Follow-up BMET in 2 weeks with clinical visit thereafter.  2.  Left leg swelling, noted following stroke and potentially related to effects on  autonomic nervous system.  We will however obtain lower extremity venous Dopplers to exclude DVT.  3.  Hyperlipidemia, on Lipitor with recent LDL 68.  4.  Essential hypertension, medication adjustments being made.  5.  Type 2 diabetes mellitus, being managed and followed by Dr. Quillian Quince.  6.  Tobacco abuse in remission.  Current medicines were reviewed with the patient today.   Orders Placed This Encounter  Procedures  . Basic metabolic panel    Disposition: Follow-up in 1 month.  Signed, Satira Sark, MD, [facility] 08/28/2017 12:24 PM    [facility] [place], [place], [place]  [place] Phone: [phone]; Fax: [phone]

## 2017-08-27 NOTE — Progress Notes (Signed)
NEUROLOGY CONSULTATION NOTE  [name] MRN: [id number] DOB: Jun 09, 1966  Referring provider: Dr. Quillian Quince Primary care provider: Dr. Quillian Quince  Reason for consult:  stroke  HISTORY OF PRESENT ILLNESS: [name] is a 52 year old right-handed male with hypertension, type 2 diabetes mellitus, CAD, cardiomyopathy, COPD, tobacco use disorder, depression and history of prior stroke who presents for stroke.  History supplemented by hospital records and PCP note.  He was admitted to [facility] on 06/14/17 with sudden onset left sided weakness and numbness and difficulty speaking.  He initially presented to [facility] where he received tPA with NIHSS of 15 and was then transferred to [facility].    MRI of brain showed acute right basal ganglia infarct as well as remote right frontal infarct.  MRA of head and carotid doppler revealed no significant intracranial or extracranial arterial stenosis or occlusion.  He was unable to have CTA due to allergy to iodinated contrast.    Echocardiogram with bubble study showed EF of 25-30% with no evidence of PFO or ASD.  He was evaluated by cardiology for ischemic cardiomyopathy.  Lexiscan nuclear perfusion study revealed EF 18% with large infarct involving the apex and periapical anterior wall with no evidence of ischemia.  He was advised to follow up with outpatient cardiology.  LDL was 123 and triglycerides were 237.  Hgb A1c was 5.7.  He was already taking ASA 325mg  but not daily, as well as Plavix.  He is continued on ASA and Plavix.  He was continued on rosuvastatin 40mg , Zetia 10mg  and Tricor 145mg  daily.  He is currently plegic on the left side.  He is undergoing PT/OT 2 to 3 times a week.  He reports that his left side is painful. It is an aching and muscle spasm pain.  He is on gabapentin.  PAST MEDICAL HISTORY: Past Medical History:  Diagnosis Date  . Anaphylaxis    IGE mediated  . Anxiety   . Arthritis   . Cardiomyopathy  ([place])   . CHF (congestive heart failure) ([place])   . COPD (chronic obstructive pulmonary disease) ([place])   . Coronary atherosclerosis of native coronary artery    Multivessel status post CABG 2010  . Depression   . Essential hypertension, benign   . History of CVA (cerebrovascular accident) 01/2012   Right posterior frontal cortical and subcortical brain by MRI, no hemorrhage. Carotid Dopplers showed only 1-50% bilateral ICA stenoses. Echocardiogram showed LVEF 50%, no major valvular abnormalities.  . Mixed hyperlipidemia   . Myocardial infarction ([place]) 2010  . OSA (obstructive sleep apnea)   . Stroke ([place])   . Type 2 diabetes mellitus ([place])     PAST SURGICAL HISTORY: Past Surgical History:  Procedure Laterality Date  . CHOLECYSTECTOMY    . CORONARY ARTERY BYPASS GRAFT  2010   LIMA to LAD, SVG to diagonal, SVG to OM1 and OM 2, SVG to RCA  . DENTAL SURGERY  2003  . GASTRIC BYPASS  2010  . HERNIA REPAIR  2011, 2012  . INCISIONAL HERNIA REPAIR N/A 07/23/2013   Procedure: HERNIA REPAIR INCISIONAL WITH MESH;  Surgeon: Jamesetta So, MD;  Location: [facility];  Service: General;  Laterality: N/A;  . INSERTION OF MESH N/A 07/23/2013   Procedure: INSERTION OF MESH;  Surgeon: Jamesetta So, MD;  Location: [facility];  Service: General;  Laterality: N/A;  . LEFT HEART CATHETERIZATION WITH CORONARY/GRAFT ANGIOGRAM N/A 11/01/2013   Procedure: LEFT HEART CATHETERIZATION WITH CORONARY/GRAFT ANGIOGRAM;  Surgeon: Blane Ohara, MD;  [facility] C[facility] LAB;  Service: Cardiovascular;  Laterality: N/A;  . TOE AMPUTATION  1998   right 1st and 2nd toe  . TONSILECTOMY, ADENOIDECTOMY, BILATERAL MYRINGOTOMY AND TUBES    . TONSILLECTOMY    . VENTRAL HERNIA REPAIR N/A 10/28/2012   Procedure: LAPAROSCOPIC VENTRAL HERNIA;  Surgeon: Donato Heinz, MD;  Location: [facility];  Service: General;  Laterality: N/A;    MEDICATIONS: Current Outpatient Medications on File Prior to Visit  Medication Sig Dispense Refill  .  Cholecalciferol (VITAMIN D) 2000 units tablet Take 2,000 Units by mouth daily at 12 noon.    . fenofibrate micronized (LOFIBRA) 134 MG capsule Take 134 mg by mouth daily before breakfast.     . traMADol (ULTRAM) 50 MG tablet Take 50 mg by mouth every 6 (six) hours as needed.    . vitamin B-12 (CYANOCOBALAMIN) 1000 MCG tablet Take 1,000 mcg by mouth daily at 12 noon.    [name] albuterol (PROVENTIL HFA;VENTOLIN HFA) 108 (90 BASE) MCG/ACT inhaler Inhale 2 puffs into the lungs every 6 (six) hours as needed for wheezing or shortness of breath.    . ALPRAZolam (XANAX) 1 MG tablet Take 1 mg by mouth 3 (three) times daily as needed for anxiety.     [name] amLODipine (NORVASC) 10 MG tablet Take 10 mg by mouth daily.    [name] aspirin EC 325 MG tablet Take 325 mg by mouth daily.    [name] atorvastatin (LIPITOR) 20 MG tablet Take 40 mg by mouth every evening.     . carvedilol (COREG) 6.25 MG tablet Take 6.25 mg by mouth 2 (two) times daily with a meal.    . clopidogrel (PLAVIX) 75 MG tablet TAKE 1 TABLET ONCE DAILY. 30 tablet 6  . DULoxetine (CYMBALTA) 60 MG capsule Take 60 mg by mouth daily.    . fluticasone (CUTIVATE) 0.05 % cream Apply 1 application topically 2 (two) times daily.    [name] gabapentin (NEURONTIN) 300 MG capsule Take 300 mg by mouth 2 (two) times daily. 600mg  QHS    . isosorbide mononitrate (IMDUR) 60 MG 24 hr tablet Take 60 mg by mouth 2 (two) times daily.     . nitroGLYCERIN (NITROSTAT) 0.4 MG SL tablet Place 0.4 mg under the tongue every 5 (five) minutes as needed. For chest pain    . pantoprazole (PROTONIX) 40 MG tablet Take 40 mg by mouth daily.    [name] PARoxetine (PAXIL) 20 MG tablet Take 40 mg by mouth daily.      No current facility-administered medications on file prior to visit.     ALLERGIES: Allergies  Allergen Reactions  . Contrast Media [Iodinated Diagnostic Agents] Anaphylaxis, Shortness Of Breath, Swelling and Rash    Isovue contrast is most acceptable agent based on previous experience and  testing with premedications  . Ibuprofen Anaphylaxis, Hives and Swelling  . Nsaids Anaphylaxis, Hives, Swelling and Rash    FAMILY HISTORY: Family History  Problem Relation Age of Onset  . Lung cancer Father   . Heart disease Father   . Heart disease Mother   . Congestive Heart Failure Mother   . Diabetes Mother   . Hyperlipidemia Mother   . Breast cancer Maternal Aunt     SOCIAL HISTORY: Social History   Socioeconomic History  . Marital status: Legally Separated    Spouse name: Jolayne Haines  . Number of children: 2  . Years of education: Not on file  . Highest education level: 12th  grade  Social Needs  . Financial resource strain: Not on file  . Food insecurity - worry: Not on file  . Food insecurity - inability: Not on file  . Transportation needs - medical: Not on file  . Transportation needs - non-medical: Not on file  Occupational History  . Occupation: disabled  Tobacco Use  . Smoking status: Current Some Day Smoker    Packs/day: 0.75    Years: 30.00    Pack years: 22.50    Types: Cigarettes    Start date: 06/08/1982    Last attempt to quit: 09/28/2014    Years since quitting: 2.9  . Smokeless tobacco: Never Used  . Tobacco comment: 1 pack every 1 1/2 days   Substance and Sexual Activity  . Alcohol use: No    Alcohol/week: 0.0 oz    Comment: rarely  . Drug use: No  . Sexual activity: Yes    Birth control/protection: None  Other Topics Concern  . Not on file  Social History Narrative   Disabled since age 25 lives with wife and children      Patient is right-handed. He is married, lives in a 1 story house, has a ramp to enter. Drinks 64oz of Mtn. Dew a day. Prior to CVA was walking daily. 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    REVIEW OF SYSTEMS: Constitutional: No fevers, chills, or sweats, no generalized fatigue, change in appetite Eyes: No visual changes, double vision, eye pain Ear, nose and throat: No hearing loss, ear pain, nasal  congestion, sore throat Cardiovascular: No chest pain, palpitations Respiratory:  No shortness of breath at rest or with exertion, wheezes GastrointestinaI: No nausea, vomiting, diarrhea, abdominal pain, fecal incontinence Genitourinary:  No dysuria, urinary retention or frequency Musculoskeletal:  No neck pain, back pain Integumentary: No rash, pruritus, skin lesions Neurological: as above Psychiatric: No depression, insomnia, anxiety Endocrine: No palpitations, fatigue, diaphoresis, mood swings, change in appetite, change in weight, increased thirst Hematologic/Lymphatic:  No purpura, petechiae. Allergic/Immunologic: no itchy/runny eyes, nasal congestion, recent allergic reactions, rashes  PHYSICAL EXAM: Vitals:   08/27/17 1241  BP: (!) 134/52  Pulse: 77  SpO2: 98%   General: No acute distress.  Patient appears  Head:  Normocephalic/atraumatic Eyes:  fundi examined but not visualized Neck: supple, no paraspinal tenderness, full range of motion Back: No paraspinal tenderness Heart: regular rate and rhythm Lungs: Clear to auscultation bilaterally. Vascular: No carotid bruits. Neurological Exam: Mental status: alert and oriented to person, place, and time, recent and remote memory intact, fund of knowledge intact, attention and concentration intact, speech fluent and not dysarthric, language intact. Cranial nerves: CN I: not tested CN II: pupils equal, round and reactive to light, visual fields intact CN III, IV, VI:  full range of motion, no nystagmus, no ptosis CN V: decreased left V2-V3 CN VII: upper and lower face symmetric CN VIII: hearing intact CN IX, X: gag intact, uvula midline CN XI: sternocleidomastoid and trapezius muscles intact CN XII: tongue midline Bulk & Tone: flaccid in left upper extremity, no fasciculations. Motor:  Left upper extremity plegic, 2+/5 left hip flexion, plegic left knee flexion, left foot drop, left knee extension and right side  5/5. Sensation:  Dysesthesias in left upper extremity, decreased pinprick in left lower extremity and vibration sensation decreased bilaterally. Deep Tendon Reflexes:  3+ left upper and lower extremities with sustained clonus in the left ankle, left Babinski.  5/5 on right. Finger to nose testing:  Without  dysmetria on right, unable to test left Gait:  Left hemiparetic gait with cane.  IMPRESSION: Left hemiplegia as late effect of stroke, likely secondary to small vessel disease Hypertension Hyperlipidemia Cardiomyopathy Type 2 diabetes Tobacco use disorder  PLAN: 1.  Continue ASA and Plavix dual antiplatelet therapy for secondary stroke prevention. 2.  Continue blood pressure control, glycemic control and statin therapy (LDL goal less than 70) 3.  In addition to gabapentin, will try baclofen 10mg  three times daily as needed for left sided muscle spasms. 4.  Refer to PM&R 5.  Will order AFO for left foot. 6.  Smoking cessation 7.  Heart-healthy diet 8.  Follow up with cardiology tomorrow as scheduled. 9.  Follow up in 3 months.  Thank you for allowing me to take part in the care of this patient.  Metta Clines, DO  CC:  Gar Ponto, MD

## 2017-08-28 ENCOUNTER — Encounter: Payer: Self-pay | Admitting: Cardiology

## 2017-08-28 ENCOUNTER — Ambulatory Visit (INDEPENDENT_AMBULATORY_CARE_PROVIDER_SITE_OTHER): Payer: Medicare Other | Admitting: Cardiology

## 2017-08-28 ENCOUNTER — Telehealth: Payer: Self-pay | Admitting: Cardiology

## 2017-08-28 VITALS — BP 144/79 | HR 81 | Ht 71.0 in | Wt 256.0 lb

## 2017-08-28 DIAGNOSIS — I10 Essential (primary) hypertension: Secondary | ICD-10-CM

## 2017-08-28 DIAGNOSIS — M7989 Other specified soft tissue disorders: Secondary | ICD-10-CM

## 2017-08-28 DIAGNOSIS — Z8673 Personal history of transient ischemic attack (TIA), and cerebral infarction without residual deficits: Secondary | ICD-10-CM | POA: Diagnosis not present

## 2017-08-28 DIAGNOSIS — E782 Mixed hyperlipidemia: Secondary | ICD-10-CM

## 2017-08-28 DIAGNOSIS — I255 Ischemic cardiomyopathy: Secondary | ICD-10-CM

## 2017-08-28 MED ORDER — AMLODIPINE BESYLATE 5 MG PO TABS: 5.0000 mg | ORAL_TABLET | Freq: Every day | ORAL | 1 refills | Status: DC

## 2017-08-28 MED ORDER — SACUBITRIL-VALSARTAN 24-26 MG PO TABS: 1.0000 | ORAL_TABLET | Freq: Two times a day (BID) | ORAL | 1 refills | Status: DC

## 2017-08-28 NOTE — Progress Notes (Signed)
Called [employer], was advsd to fax info to f# [phone]. Faxed demo and ins info. Called Pt, spoke with Prentice Docker, gave her the [phone] to call if has not heard from [employer] by end of day.

## 2017-08-28 NOTE — Patient Instructions (Signed)
Medication Instructions:  Your physician has recommended you make the following change in your medication:   BEGIN Entresto 24/26 mg twice daily  DECREASE Amlodipine to 5 mg daily  Please continue all other medications as prescribed  Labwork: BMP In 2 weeks Orders given today   Testing/Procedures: Your physician has requested that you have a lower extremity arterial exercise duplex. During this test, exercise and ultrasound are used to evaluate arterial blood flow in the legs. Allow one hour for this exam. There are no restrictions or special instructions.  Follow-Up: Your physician recommends that you schedule a follow-up appointment in: [place]  Any Other Special Instructions Will Be Listed Below (If Applicable).  If you need a refill on your cardiac medications before your next appointment, please call your pharmacy.

## 2017-08-28 NOTE — Addendum Note (Signed)
Addended by: Clois Comber on: 08/28/2017 03:08 PM   Modules accepted: Orders

## 2017-08-28 NOTE — Telephone Encounter (Signed)
Pre-cert Verification for the following procedure   LE DOPPLERS scheduled for 09/04/2017

## 2017-08-29 DIAGNOSIS — I255 Ischemic cardiomyopathy: Secondary | ICD-10-CM | POA: Diagnosis not present

## 2017-08-29 DIAGNOSIS — I69354 Hemiplegia and hemiparesis following cerebral infarction affecting left non-dominant side: Secondary | ICD-10-CM | POA: Diagnosis not present

## 2017-08-29 DIAGNOSIS — E119 Type 2 diabetes mellitus without complications: Secondary | ICD-10-CM | POA: Diagnosis not present

## 2017-08-29 DIAGNOSIS — I110 Hypertensive heart disease with heart failure: Secondary | ICD-10-CM | POA: Diagnosis not present

## 2017-08-29 DIAGNOSIS — I5023 Acute on chronic systolic (congestive) heart failure: Secondary | ICD-10-CM | POA: Diagnosis not present

## 2017-08-29 DIAGNOSIS — I2510 Atherosclerotic heart disease of native coronary artery without angina pectoris: Secondary | ICD-10-CM | POA: Diagnosis not present

## 2017-08-29 NOTE — Progress Notes (Signed)
Rcvd call from [place] at [place]. She needs signed order faxed to [phone]. Faxing order

## 2017-09-02 DIAGNOSIS — E119 Type 2 diabetes mellitus without complications: Secondary | ICD-10-CM | POA: Diagnosis not present

## 2017-09-02 DIAGNOSIS — I2510 Atherosclerotic heart disease of native coronary artery without angina pectoris: Secondary | ICD-10-CM | POA: Diagnosis not present

## 2017-09-02 DIAGNOSIS — I255 Ischemic cardiomyopathy: Secondary | ICD-10-CM | POA: Diagnosis not present

## 2017-09-02 DIAGNOSIS — I5023 Acute on chronic systolic (congestive) heart failure: Secondary | ICD-10-CM | POA: Diagnosis not present

## 2017-09-02 DIAGNOSIS — I110 Hypertensive heart disease with heart failure: Secondary | ICD-10-CM | POA: Diagnosis not present

## 2017-09-02 DIAGNOSIS — I69354 Hemiplegia and hemiparesis following cerebral infarction affecting left non-dominant side: Secondary | ICD-10-CM | POA: Diagnosis not present

## 2017-09-03 DIAGNOSIS — I5023 Acute on chronic systolic (congestive) heart failure: Secondary | ICD-10-CM | POA: Diagnosis not present

## 2017-09-03 DIAGNOSIS — I110 Hypertensive heart disease with heart failure: Secondary | ICD-10-CM | POA: Diagnosis not present

## 2017-09-03 DIAGNOSIS — E119 Type 2 diabetes mellitus without complications: Secondary | ICD-10-CM | POA: Diagnosis not present

## 2017-09-03 DIAGNOSIS — I69354 Hemiplegia and hemiparesis following cerebral infarction affecting left non-dominant side: Secondary | ICD-10-CM | POA: Diagnosis not present

## 2017-09-03 DIAGNOSIS — I255 Ischemic cardiomyopathy: Secondary | ICD-10-CM | POA: Diagnosis not present

## 2017-09-03 DIAGNOSIS — I2510 Atherosclerotic heart disease of native coronary artery without angina pectoris: Secondary | ICD-10-CM | POA: Diagnosis not present

## 2017-09-04 ENCOUNTER — Ambulatory Visit (INDEPENDENT_AMBULATORY_CARE_PROVIDER_SITE_OTHER): Payer: Medicare Other

## 2017-09-04 ENCOUNTER — Encounter: Payer: Self-pay | Admitting: Physical Medicine & Rehabilitation

## 2017-09-04 ENCOUNTER — Telehealth: Payer: Self-pay

## 2017-09-04 DIAGNOSIS — I255 Ischemic cardiomyopathy: Secondary | ICD-10-CM | POA: Diagnosis not present

## 2017-09-04 DIAGNOSIS — I110 Hypertensive heart disease with heart failure: Secondary | ICD-10-CM | POA: Diagnosis not present

## 2017-09-04 DIAGNOSIS — I69354 Hemiplegia and hemiparesis following cerebral infarction affecting left non-dominant side: Secondary | ICD-10-CM | POA: Diagnosis not present

## 2017-09-04 DIAGNOSIS — E119 Type 2 diabetes mellitus without complications: Secondary | ICD-10-CM | POA: Diagnosis not present

## 2017-09-04 DIAGNOSIS — I2510 Atherosclerotic heart disease of native coronary artery without angina pectoris: Secondary | ICD-10-CM | POA: Diagnosis not present

## 2017-09-04 DIAGNOSIS — M7989 Other specified soft tissue disorders: Secondary | ICD-10-CM

## 2017-09-04 DIAGNOSIS — I5023 Acute on chronic systolic (congestive) heart failure: Secondary | ICD-10-CM | POA: Diagnosis not present

## 2017-09-04 NOTE — Telephone Encounter (Signed)
-----   Message from Satira Sark, MD sent at 09/04/2017 12:01 PM EST ----- Results reviewed.  Please let him know that the Doppler studies do not indicate presence of DVT on the left. A copy of this test should be forwarded to Caryl Bis, MD.

## 2017-09-04 NOTE — Telephone Encounter (Signed)
Patient notified. Routed to PCP 

## 2017-09-05 NOTE — Progress Notes (Signed)
Rcvd request by fax from [employer], needs records. Faxed Consult note to Maryruth Hancock at [phone]

## 2017-09-09 DIAGNOSIS — I5022 Chronic systolic (congestive) heart failure: Secondary | ICD-10-CM | POA: Diagnosis not present

## 2017-09-09 DIAGNOSIS — E119 Type 2 diabetes mellitus without complications: Secondary | ICD-10-CM | POA: Diagnosis not present

## 2017-09-09 DIAGNOSIS — I110 Hypertensive heart disease with heart failure: Secondary | ICD-10-CM | POA: Diagnosis not present

## 2017-09-09 DIAGNOSIS — I255 Ischemic cardiomyopathy: Secondary | ICD-10-CM | POA: Diagnosis not present

## 2017-09-09 DIAGNOSIS — I69354 Hemiplegia and hemiparesis following cerebral infarction affecting left non-dominant side: Secondary | ICD-10-CM | POA: Diagnosis not present

## 2017-09-09 DIAGNOSIS — I2510 Atherosclerotic heart disease of native coronary artery without angina pectoris: Secondary | ICD-10-CM | POA: Diagnosis not present

## 2017-09-10 DIAGNOSIS — I5022 Chronic systolic (congestive) heart failure: Secondary | ICD-10-CM | POA: Diagnosis not present

## 2017-09-10 DIAGNOSIS — E119 Type 2 diabetes mellitus without complications: Secondary | ICD-10-CM | POA: Diagnosis not present

## 2017-09-10 DIAGNOSIS — I69354 Hemiplegia and hemiparesis following cerebral infarction affecting left non-dominant side: Secondary | ICD-10-CM | POA: Diagnosis not present

## 2017-09-10 DIAGNOSIS — I255 Ischemic cardiomyopathy: Secondary | ICD-10-CM | POA: Diagnosis not present

## 2017-09-10 DIAGNOSIS — I110 Hypertensive heart disease with heart failure: Secondary | ICD-10-CM | POA: Diagnosis not present

## 2017-09-10 DIAGNOSIS — I2510 Atherosclerotic heart disease of native coronary artery without angina pectoris: Secondary | ICD-10-CM | POA: Diagnosis not present

## 2017-09-11 DIAGNOSIS — E119 Type 2 diabetes mellitus without complications: Secondary | ICD-10-CM | POA: Diagnosis not present

## 2017-09-11 DIAGNOSIS — I5022 Chronic systolic (congestive) heart failure: Secondary | ICD-10-CM | POA: Diagnosis not present

## 2017-09-11 DIAGNOSIS — I255 Ischemic cardiomyopathy: Secondary | ICD-10-CM | POA: Diagnosis not present

## 2017-09-11 DIAGNOSIS — I69354 Hemiplegia and hemiparesis following cerebral infarction affecting left non-dominant side: Secondary | ICD-10-CM | POA: Diagnosis not present

## 2017-09-11 DIAGNOSIS — I110 Hypertensive heart disease with heart failure: Secondary | ICD-10-CM | POA: Diagnosis not present

## 2017-09-11 DIAGNOSIS — I2510 Atherosclerotic heart disease of native coronary artery without angina pectoris: Secondary | ICD-10-CM | POA: Diagnosis not present

## 2017-09-12 ENCOUNTER — Telehealth: Payer: Self-pay

## 2017-09-12 DIAGNOSIS — I110 Hypertensive heart disease with heart failure: Secondary | ICD-10-CM | POA: Diagnosis not present

## 2017-09-12 DIAGNOSIS — I255 Ischemic cardiomyopathy: Secondary | ICD-10-CM | POA: Diagnosis not present

## 2017-09-12 DIAGNOSIS — I2510 Atherosclerotic heart disease of native coronary artery without angina pectoris: Secondary | ICD-10-CM | POA: Diagnosis not present

## 2017-09-12 DIAGNOSIS — E119 Type 2 diabetes mellitus without complications: Secondary | ICD-10-CM | POA: Diagnosis not present

## 2017-09-12 DIAGNOSIS — I5022 Chronic systolic (congestive) heart failure: Secondary | ICD-10-CM | POA: Diagnosis not present

## 2017-09-12 DIAGNOSIS — I69354 Hemiplegia and hemiparesis following cerebral infarction affecting left non-dominant side: Secondary | ICD-10-CM | POA: Diagnosis not present

## 2017-09-12 NOTE — Telephone Encounter (Signed)
-----   Message from Acquanetta Chain, LPN sent at 02/17/1482 10:12 AM EST -----   ----- Message ----- From: Satira Sark, MD Sent: 09/12/2017   9:20 AM To: Merlene Laughter, LPN  Results reviewed.  Renal function and potassium normal.  Continue with current plan. A copy of this test should be forwarded to Caryl Bis, MD.

## 2017-09-12 NOTE — Telephone Encounter (Signed)
Patient notified. Routed to PCP 

## 2017-09-16 DIAGNOSIS — E119 Type 2 diabetes mellitus without complications: Secondary | ICD-10-CM | POA: Diagnosis not present

## 2017-09-16 DIAGNOSIS — I69354 Hemiplegia and hemiparesis following cerebral infarction affecting left non-dominant side: Secondary | ICD-10-CM | POA: Diagnosis not present

## 2017-09-16 DIAGNOSIS — I255 Ischemic cardiomyopathy: Secondary | ICD-10-CM | POA: Diagnosis not present

## 2017-09-16 DIAGNOSIS — I5022 Chronic systolic (congestive) heart failure: Secondary | ICD-10-CM | POA: Diagnosis not present

## 2017-09-16 DIAGNOSIS — I2510 Atherosclerotic heart disease of native coronary artery without angina pectoris: Secondary | ICD-10-CM | POA: Diagnosis not present

## 2017-09-16 DIAGNOSIS — I110 Hypertensive heart disease with heart failure: Secondary | ICD-10-CM | POA: Diagnosis not present

## 2017-09-17 DIAGNOSIS — I255 Ischemic cardiomyopathy: Secondary | ICD-10-CM | POA: Diagnosis not present

## 2017-09-17 DIAGNOSIS — I69354 Hemiplegia and hemiparesis following cerebral infarction affecting left non-dominant side: Secondary | ICD-10-CM | POA: Diagnosis not present

## 2017-09-17 DIAGNOSIS — I110 Hypertensive heart disease with heart failure: Secondary | ICD-10-CM | POA: Diagnosis not present

## 2017-09-17 DIAGNOSIS — E119 Type 2 diabetes mellitus without complications: Secondary | ICD-10-CM | POA: Diagnosis not present

## 2017-09-17 DIAGNOSIS — I5022 Chronic systolic (congestive) heart failure: Secondary | ICD-10-CM | POA: Diagnosis not present

## 2017-09-17 DIAGNOSIS — I2510 Atherosclerotic heart disease of native coronary artery without angina pectoris: Secondary | ICD-10-CM | POA: Diagnosis not present

## 2017-09-18 ENCOUNTER — Telehealth: Payer: Self-pay | Admitting: Cardiology

## 2017-09-18 ENCOUNTER — Encounter: Payer: Self-pay | Admitting: Cardiology

## 2017-09-18 ENCOUNTER — Telehealth: Payer: Self-pay | Admitting: Neurology

## 2017-09-18 ENCOUNTER — Ambulatory Visit (INDEPENDENT_AMBULATORY_CARE_PROVIDER_SITE_OTHER): Payer: Medicare Other | Admitting: Cardiology

## 2017-09-18 VITALS — BP 128/70 | HR 76 | Ht 71.0 in | Wt 264.0 lb

## 2017-09-18 DIAGNOSIS — I255 Ischemic cardiomyopathy: Secondary | ICD-10-CM

## 2017-09-18 DIAGNOSIS — I2510 Atherosclerotic heart disease of native coronary artery without angina pectoris: Secondary | ICD-10-CM | POA: Diagnosis not present

## 2017-09-18 DIAGNOSIS — I25119 Atherosclerotic heart disease of native coronary artery with unspecified angina pectoris: Secondary | ICD-10-CM

## 2017-09-18 DIAGNOSIS — I110 Hypertensive heart disease with heart failure: Secondary | ICD-10-CM | POA: Diagnosis not present

## 2017-09-18 DIAGNOSIS — I10 Essential (primary) hypertension: Secondary | ICD-10-CM

## 2017-09-18 DIAGNOSIS — I69354 Hemiplegia and hemiparesis following cerebral infarction affecting left non-dominant side: Secondary | ICD-10-CM | POA: Diagnosis not present

## 2017-09-18 DIAGNOSIS — E119 Type 2 diabetes mellitus without complications: Secondary | ICD-10-CM | POA: Diagnosis not present

## 2017-09-18 DIAGNOSIS — I5022 Chronic systolic (congestive) heart failure: Secondary | ICD-10-CM | POA: Diagnosis not present

## 2017-09-18 DIAGNOSIS — E782 Mixed hyperlipidemia: Secondary | ICD-10-CM

## 2017-09-18 MED ORDER — SACUBITRIL-VALSARTAN 49-51 MG PO TABS: 1.0000 | ORAL_TABLET | Freq: Two times a day (BID) | ORAL | 1 refills | Status: DC

## 2017-09-18 NOTE — Patient Instructions (Signed)
Medication Instructions:  Your physician has recommended you make the following change in your medication:    INCREASE Entresto to 49-51 mg   HOLD Norvasc for now   Please continue all other medications as prescribed  Labwork:  BMET  In 2 weeks  Orders given today   Testing/Procedures: Your physician has requested that you have an echocardiogram. Echocardiography is a painless test that uses sound waves to create images of your heart. It provides your doctor with information about the size and shape of your heart and how well your heart's chambers and valves are working. This procedure takes approximately one hour. There are no restrictions for this procedure.  Follow-Up: Your physician recommends that you schedule a follow-up appointment in: 6 weeks with Dr. Domenic Polite  Any Other Special Instructions Will Be Listed Below (If Applicable).  If you need a refill on your cardiac medications before your next appointment, please call your pharmacy.

## 2017-09-18 NOTE — Progress Notes (Signed)
Cardiology Office Note  Date: 09/18/2017   ID: [name], DOB [birth year], MRN [id number]  PCP: [name] Bis, MD  Primary Cardiologist: [name] Lesches, MD   Chief Complaint  Patient presents with  . Coronary Artery Disease  . Cardiomyopathy    History of Present Illness: [name] is a medically complex 52 y.o. male seen recently in February.  He presents today with his wife for a follow-up visit.  States that he continues to make slow progress with physical therapy, still has to use a wheelchair.  His strength has been slowly improving.  He does not report any chest pain.  He was placed on Entresto since last visit, follow-up lab work showed normal renal function and potassium.  He has tolerated this medication well, blood pressure has also improved, systolic in the 784O largely.  We have discussed further up titration and stopping Norvasc if possible.  Interval venous Dopplers were negative for DVT as outlined below.  We will plan to give him a prescription for compression stockings.  Past Medical History:  Diagnosis Date  . Anaphylaxis    IGE mediated  . Anxiety   . Arthritis   . Cardiomyopathy ([name])   . CHF (congestive heart failure) ([name])   . COPD (chronic obstructive pulmonary disease) ([name])   . Coronary atherosclerosis of native coronary artery    Multivessel status post CABG 2010  . Depression   . Essential hypertension, benign   . History of CVA (cerebrovascular accident) 01/2012   Right posterior frontal cortical and subcortical brain by MRI, no hemorrhage. Carotid Dopplers showed only 1-50% bilateral ICA stenoses. Echocardiogram showed LVEF 50%, no major valvular abnormalities.  . Mixed hyperlipidemia   . Myocardial infarction ([name]) 2010  . OSA (obstructive sleep apnea)   . Stroke ([place])   . Type 2 diabetes mellitus ([name])     Past Surgical History:  Procedure Laterality Date  . CHOLECYSTECTOMY    . CORONARY ARTERY BYPASS GRAFT  2010     LIMA to LAD, SVG to diagonal, SVG to OM1 and OM 2, SVG to RCA  . DENTAL SURGERY  2003  . GASTRIC BYPASS  2010  . HERNIA REPAIR  2011, 2012  . INCISIONAL HERNIA REPAIR N/A 07/23/2013   Procedure: HERNIA REPAIR INCISIONAL WITH MESH;  Surgeon: Jamesetta So, MD;  Location: [facility];  Service: General;  Laterality: N/A;  . INSERTION OF MESH N/A 07/23/2013   Procedure: INSERTION OF MESH;  Surgeon: Jamesetta So, MD;  Location: [facility];  Service: General;  Laterality: N/A;  . LEFT HEART CATHETERIZATION WITH CORONARY/GRAFT ANGIOGRAM N/A 11/01/2013   Procedure: LEFT HEART CATHETERIZATION WITH [name];  Surgeon: [name] Ohara, MD;  Location: [facility] CATH LAB;  Service: Cardiovascular;  Laterality: N/A;  . TOE AMPUTATION  1998   right 1st and 2nd toe  . TONSILECTOMY, ADENOIDECTOMY, BILATERAL MYRINGOTOMY AND TUBES    . TONSILLECTOMY    . VENTRAL HERNIA REPAIR N/A 10/28/2012   Procedure: LAPAROSCOPIC VENTRAL HERNIA;  Surgeon: [name] Heinz, MD;  Location: [facility];  Service: General;  Laterality: N/A;    Current Outpatient Medications  Medication Sig Dispense Refill  . albuterol (PROVENTIL HFA;VENTOLIN HFA) 108 (90 BASE) MCG/ACT inhaler Inhale 2 puffs into the lungs every 6 (six) hours as needed for wheezing or shortness of breath.    . ALPRAZolam (XANAX) 1 MG tablet Take 1 mg by mouth 3 (three) times daily as needed for anxiety.     [name]  amLODipine (NORVASC) 5 MG tablet Take 1 tablet (5 mg total) by mouth daily. (Patient taking differently: Take 5 mg by mouth daily. ) 30 tablet 1  . aspirin EC 325 MG tablet Take 325 mg by mouth daily.    [name] atorvastatin (LIPITOR) 20 MG tablet Take 40 mg by mouth every evening.     . baclofen (LIORESAL) 10 MG tablet Take 1 tablet (10 mg total) by mouth 3 (three) times daily as needed for muscle spasms. 90 each 1  . carvedilol (COREG) 6.25 MG tablet Take 6.25 mg by mouth 2 (two) times daily with a meal.    . Cholecalciferol (VITAMIN D) 2000 units tablet Take  2,000 Units by mouth daily at 12 noon.    . clopidogrel (PLAVIX) 75 MG tablet TAKE 1 TABLET ONCE DAILY. 30 tablet 6  . DULoxetine (CYMBALTA) 60 MG capsule Take 60 mg by mouth daily.    . fenofibrate micronized (LOFIBRA) 134 MG capsule Take 134 mg by mouth daily before breakfast.     . fluticasone (CUTIVATE) 0.05 % cream Apply 1 application topically 2 (two) times daily.    [name] gabapentin (NEURONTIN) 300 MG capsule Take 300 mg by mouth 2 (two) times daily. 600mg  QHS    . isosorbide mononitrate (IMDUR) 60 MG 24 hr tablet Take 60 mg by mouth 2 (two) times daily.     . nitroGLYCERIN (NITROSTAT) 0.4 MG SL tablet Place 0.4 mg under the tongue every 5 (five) minutes as needed. For chest pain    . pantoprazole (PROTONIX) 40 MG tablet Take 40 mg by mouth daily.    [name] PARoxetine (PAXIL) 20 MG tablet Take 40 mg by mouth daily.     . traMADol (ULTRAM) 50 MG tablet Take 50 mg by mouth every 6 (six) hours as needed.    . vitamin B-12 (CYANOCOBALAMIN) 1000 MCG tablet Take 1,000 mcg by mouth daily at 12 noon.    . sacubitril-valsartan (ENTRESTO) 49-51 MG Take 1 tablet by mouth 2 (two) times daily. 60 tablet 1   No current facility-administered medications for this visit.    Allergies:  Contrast media [iodinated diagnostic agents]; Ibuprofen; and Nsaids   Social History: The patient  reports that he has quit smoking. His smoking use included cigarettes. He started smoking about 35 years ago. He has a 22.50 pack-year smoking history. he has never used smokeless tobacco. He reports that he does not drink alcohol or use drugs.   ROS:  Please see the history of present illness. Otherwise, complete review of systems is positive for fatigue.  All other systems are reviewed and negative.   Physical Exam: VS:  BP 128/70   Pulse 76   Ht 5\' 11"  (1.803 m)   Wt 264 lb (119.7 kg)   SpO2 98%   BMI 36.82 kg/m , BMI Body mass index is 36.82 kg/m.  Wt Readings from Last 3 Encounters:  09/18/17 264 lb (119.7 kg)    08/28/17 256 lb (116.1 kg)  08/27/17 252 lb (114.3 kg)    General: Overweight male in a wheelchair. HEENT: Conjunctiva and lids normal, oropharynx clear. Neck: Supple, no elevated JVP or carotid bruits, no thyromegaly. Lungs: Clear to auscultation, nonlabored breathing at rest. Cardiac: Regular rate and rhythm, no S3 or significant systolic murmur, no pericardial rub. Abdomen: Soft, nontender, bowel sounds present, no guarding or rebound. Extremities: 1-2+ leg edema, distal pulses 2+. Skin: Warm and dry. Musculoskeletal: No kyphosis. Neuropsychiatric: Alert and oriented x3, affect grossly appropriate.  Left-sided hemiparesis.  ECG: I personally reviewed the tracing from 07/28/2017 which showed sinus rhythm with vertical axis, anterolateral infarct pattern, nonspecific ST changes.  Recent Labwork: 07/28/2017: B Natriuretic Peptide 138.0; BUN 22; Creatinine, Ser 1.09; Hemoglobin 14.9; Platelets 254; Potassium 3.3; Sodium 139  December 2018: Cholesterol 132, triglycerides 144, HDL 35, LDL 68 February 2019: BUN 16, creatinine 1.17, potassium 3.9  Other Studies Reviewed Today:  Lexiscan Myoview 06/18/2017 [facility]): FINDINGS: Large fixed defect is present involving the apex and large portion of the periapical anterior wall. No evidence of ischemia. Wall motion analysis reveals apical dyskinesis and akinesis. Left ventricular ejection fraction is calculated to be  18%.  IMPRESSION: Large infarct involving the apex and periapical anterior wall. No evidence of ischemia. Apical akinesis and dyskinesis with ejection fraction of 18%.  Lower extremity venous Dopplers 09/04/2017: Final Interpretation: Right: No evidence of common femoral vein obstruction. Left: No evidence of deep vein thrombosis in the lower extremity. No indirect evidence of obstruction proximal to the inguinal ligament. There is no evidence of deep vein thrombosis in the lower extremity.  Assessment and Plan:  1.   Cardiomyopathy with LVEF ranging from 20% to 40% based on testing at [facility].  We are adjusting medical therapy at this time.  Plan to increase Entresto to 49/51 mg twice daily, at the same time hold off on Norvasc to observe blood pressure trend.  This can always be added back if needed.  Follow-up BMET in 2 weeks.  We will then get an echocardiogram in 6 weeks with clinical follow-up.  2.  Multivessel CAD status post CABG.  Bypass grafts were patent at angiography in 2015.  He had a recent Myoview study at [facility] indicating large region of scar in the anterior and periapical distribution but no ischemia.  Continue medical therapy.  3.  Mixed hyperlipidemia, continues on Lipitor with recent LDL 68.  4.  Essential hypertension, blood pressure trend is better.  Current medicines were reviewed with the patient today.   Orders Placed This Encounter  Procedures  . Basic metabolic panel  . ECHOCARDIOGRAM COMPLETE    Disposition: Follow-up in 6 weeks.  Signed, Satira Sark, MD, [facility] 09/18/2017 1:54 PM    [facility] [address], [place], [place] [address] Phone: [phone]; Fax: [phone]

## 2017-09-18 NOTE — Telephone Encounter (Signed)
Patient called needing to check the Referral status for  [employer]?

## 2017-09-18 NOTE — Telephone Encounter (Signed)
Pre-cert Verification for the following procedure   Echo scheduled for 10/30/2017

## 2017-09-19 NOTE — Telephone Encounter (Signed)
I spoke with patient and informed him that this referral was sent on 08-28-17 but that I would send it again today.

## 2017-09-22 DIAGNOSIS — I2510 Atherosclerotic heart disease of native coronary artery without angina pectoris: Secondary | ICD-10-CM | POA: Diagnosis not present

## 2017-09-22 DIAGNOSIS — I5022 Chronic systolic (congestive) heart failure: Secondary | ICD-10-CM | POA: Diagnosis not present

## 2017-09-22 DIAGNOSIS — I69354 Hemiplegia and hemiparesis following cerebral infarction affecting left non-dominant side: Secondary | ICD-10-CM | POA: Diagnosis not present

## 2017-09-22 DIAGNOSIS — R600 Localized edema: Secondary | ICD-10-CM | POA: Diagnosis not present

## 2017-09-22 DIAGNOSIS — Z79899 Other long term (current) drug therapy: Secondary | ICD-10-CM | POA: Diagnosis not present

## 2017-09-22 DIAGNOSIS — E119 Type 2 diabetes mellitus without complications: Secondary | ICD-10-CM | POA: Diagnosis not present

## 2017-09-22 DIAGNOSIS — I255 Ischemic cardiomyopathy: Secondary | ICD-10-CM | POA: Diagnosis not present

## 2017-09-22 DIAGNOSIS — Z7982 Long term (current) use of aspirin: Secondary | ICD-10-CM | POA: Diagnosis not present

## 2017-09-22 DIAGNOSIS — F17200 Nicotine dependence, unspecified, uncomplicated: Secondary | ICD-10-CM | POA: Diagnosis not present

## 2017-09-22 DIAGNOSIS — I110 Hypertensive heart disease with heart failure: Secondary | ICD-10-CM | POA: Diagnosis not present

## 2017-09-22 DIAGNOSIS — G44209 Tension-type headache, unspecified, not intractable: Secondary | ICD-10-CM | POA: Diagnosis not present

## 2017-09-22 DIAGNOSIS — I10 Essential (primary) hypertension: Secondary | ICD-10-CM | POA: Diagnosis not present

## 2017-09-22 DIAGNOSIS — M1990 Unspecified osteoarthritis, unspecified site: Secondary | ICD-10-CM | POA: Diagnosis not present

## 2017-09-22 DIAGNOSIS — Z7902 Long term (current) use of antithrombotics/antiplatelets: Secondary | ICD-10-CM | POA: Diagnosis not present

## 2017-09-22 DIAGNOSIS — R609 Edema, unspecified: Secondary | ICD-10-CM | POA: Diagnosis not present

## 2017-09-22 DIAGNOSIS — R51 Headache: Secondary | ICD-10-CM | POA: Diagnosis not present

## 2017-09-22 DIAGNOSIS — F329 Major depressive disorder, single episode, unspecified: Secondary | ICD-10-CM | POA: Diagnosis not present

## 2017-09-24 ENCOUNTER — Telehealth: Payer: Self-pay | Admitting: Neurology

## 2017-09-24 DIAGNOSIS — I110 Hypertensive heart disease with heart failure: Secondary | ICD-10-CM | POA: Diagnosis not present

## 2017-09-24 DIAGNOSIS — I2510 Atherosclerotic heart disease of native coronary artery without angina pectoris: Secondary | ICD-10-CM | POA: Diagnosis not present

## 2017-09-24 DIAGNOSIS — E119 Type 2 diabetes mellitus without complications: Secondary | ICD-10-CM | POA: Diagnosis not present

## 2017-09-24 DIAGNOSIS — I5022 Chronic systolic (congestive) heart failure: Secondary | ICD-10-CM | POA: Diagnosis not present

## 2017-09-24 DIAGNOSIS — I255 Ischemic cardiomyopathy: Secondary | ICD-10-CM | POA: Diagnosis not present

## 2017-09-24 DIAGNOSIS — I69354 Hemiplegia and hemiparesis following cerebral infarction affecting left non-dominant side: Secondary | ICD-10-CM | POA: Diagnosis not present

## 2017-09-24 NOTE — Telephone Encounter (Signed)
[name] a physical therapist called to say [name] was having headaches, went to the ER and [name] had another stroke and wanted Dr Tomi Likens to be aware

## 2017-09-24 NOTE — Telephone Encounter (Signed)
Physical Therapist CB# [phone] from [facility]

## 2017-09-24 NOTE — Telephone Encounter (Signed)
FYI

## 2017-09-26 DIAGNOSIS — R7989 Other specified abnormal findings of blood chemistry: Secondary | ICD-10-CM | POA: Diagnosis not present

## 2017-09-26 DIAGNOSIS — I255 Ischemic cardiomyopathy: Secondary | ICD-10-CM | POA: Diagnosis not present

## 2017-09-26 DIAGNOSIS — I69354 Hemiplegia and hemiparesis following cerebral infarction affecting left non-dominant side: Secondary | ICD-10-CM | POA: Diagnosis not present

## 2017-09-26 DIAGNOSIS — I10 Essential (primary) hypertension: Secondary | ICD-10-CM | POA: Diagnosis not present

## 2017-09-26 DIAGNOSIS — M79605 Pain in left leg: Secondary | ICD-10-CM | POA: Diagnosis not present

## 2017-09-26 DIAGNOSIS — R791 Abnormal coagulation profile: Secondary | ICD-10-CM | POA: Diagnosis not present

## 2017-09-26 DIAGNOSIS — R609 Edema, unspecified: Secondary | ICD-10-CM | POA: Diagnosis not present

## 2017-09-26 DIAGNOSIS — Z8673 Personal history of transient ischemic attack (TIA), and cerebral infarction without residual deficits: Secondary | ICD-10-CM | POA: Diagnosis not present

## 2017-09-26 DIAGNOSIS — I5022 Chronic systolic (congestive) heart failure: Secondary | ICD-10-CM | POA: Diagnosis not present

## 2017-09-26 DIAGNOSIS — R600 Localized edema: Secondary | ICD-10-CM | POA: Diagnosis not present

## 2017-09-26 DIAGNOSIS — F329 Major depressive disorder, single episode, unspecified: Secondary | ICD-10-CM | POA: Diagnosis not present

## 2017-09-26 DIAGNOSIS — Z7982 Long term (current) use of aspirin: Secondary | ICD-10-CM | POA: Diagnosis not present

## 2017-09-26 DIAGNOSIS — E119 Type 2 diabetes mellitus without complications: Secondary | ICD-10-CM | POA: Diagnosis not present

## 2017-09-26 DIAGNOSIS — Z79899 Other long term (current) drug therapy: Secondary | ICD-10-CM | POA: Diagnosis not present

## 2017-09-26 DIAGNOSIS — I110 Hypertensive heart disease with heart failure: Secondary | ICD-10-CM | POA: Diagnosis not present

## 2017-09-26 DIAGNOSIS — F17200 Nicotine dependence, unspecified, uncomplicated: Secondary | ICD-10-CM | POA: Diagnosis not present

## 2017-09-26 DIAGNOSIS — I2510 Atherosclerotic heart disease of native coronary artery without angina pectoris: Secondary | ICD-10-CM | POA: Diagnosis not present

## 2017-09-29 ENCOUNTER — Ambulatory Visit (HOSPITAL_BASED_OUTPATIENT_CLINIC_OR_DEPARTMENT_OTHER): Payer: Medicare Other | Admitting: Physical Medicine & Rehabilitation

## 2017-09-29 ENCOUNTER — Encounter: Payer: Medicare Other | Attending: Physical Medicine & Rehabilitation

## 2017-09-29 ENCOUNTER — Encounter: Payer: Self-pay | Admitting: Physical Medicine & Rehabilitation

## 2017-09-29 VITALS — BP 111/69 | HR 80

## 2017-09-29 DIAGNOSIS — F17210 Nicotine dependence, cigarettes, uncomplicated: Secondary | ICD-10-CM | POA: Insufficient documentation

## 2017-09-29 DIAGNOSIS — G8110 Spastic hemiplegia affecting unspecified side: Secondary | ICD-10-CM

## 2017-09-29 DIAGNOSIS — F419 Anxiety disorder, unspecified: Secondary | ICD-10-CM | POA: Diagnosis not present

## 2017-09-29 DIAGNOSIS — J449 Chronic obstructive pulmonary disease, unspecified: Secondary | ICD-10-CM | POA: Insufficient documentation

## 2017-09-29 DIAGNOSIS — I255 Ischemic cardiomyopathy: Secondary | ICD-10-CM

## 2017-09-29 DIAGNOSIS — M1990 Unspecified osteoarthritis, unspecified site: Secondary | ICD-10-CM | POA: Diagnosis not present

## 2017-09-29 DIAGNOSIS — G4733 Obstructive sleep apnea (adult) (pediatric): Secondary | ICD-10-CM | POA: Diagnosis not present

## 2017-09-29 DIAGNOSIS — I110 Hypertensive heart disease with heart failure: Secondary | ICD-10-CM | POA: Insufficient documentation

## 2017-09-29 DIAGNOSIS — I509 Heart failure, unspecified: Secondary | ICD-10-CM | POA: Diagnosis not present

## 2017-09-29 DIAGNOSIS — I69354 Hemiplegia and hemiparesis following cerebral infarction affecting left non-dominant side: Secondary | ICD-10-CM | POA: Diagnosis not present

## 2017-09-29 DIAGNOSIS — E119 Type 2 diabetes mellitus without complications: Secondary | ICD-10-CM | POA: Diagnosis not present

## 2017-09-29 DIAGNOSIS — I2510 Atherosclerotic heart disease of native coronary artery without angina pectoris: Secondary | ICD-10-CM | POA: Insufficient documentation

## 2017-09-29 DIAGNOSIS — E782 Mixed hyperlipidemia: Secondary | ICD-10-CM | POA: Diagnosis not present

## 2017-09-29 DIAGNOSIS — I252 Old myocardial infarction: Secondary | ICD-10-CM | POA: Diagnosis not present

## 2017-09-29 NOTE — Progress Notes (Signed)
Subjective:    Patient ID: [name], male    DOB: [birth year], 52 y.o.   MRN: [id number]  HPI 52 year old male with history of coronary artery disease, myocardial infarction, diabetes mellitus and hypertension who developed sudden onset left-sided weakness 06/14/2017.  He went to his local hospital but then was transferred to [facility].  MRI showed a right basal ganglia infarct.  He did receive TPA.  Doppler showed no significant ICA stenosis.  MRA showed no significant intracranial or extracranial stenosis.  Echocardiogram showed a low ejection fraction of 25-30% no evidence of PFO or ASD.  No atrial clots noted.  Hemoglobin A1c was 5.7.  He was placed on aspirin and Plavix. Patient has pain on the hemiplegic side.  Has been started on gabapentin, neurology consultation on 08/27/2017 with Dr. Tomi Likens.  Baclofen 10 mg 3 times daily started for spasticity on the left side.  AFO was ordered. Lower extremity venous duplex was ordered.  Negative findings  Did rehab at [place] help with ADL Amb with QC at home with health and wife  Had an ED visit at [facility] for worsening of Left sided weakness Was told he had another CVA, Images not available for review Plans f/u with Dr Tomi Likens  Reason for referral is to evaluate spasticity Spasticity has gradually worsened with time and affects UE and LE Patient did not kidney improvements with cyclobenzaprine and this caused drowsiness.  He was started on baclofen by neurology however the patient has only taken it sporadically Patient states that he has shaking of his hand and wrist area as well as his foot and ankle area on the left side.  He also has shaking of his knee at night when he tries to sleep. Pain Inventory Average Pain 7 Pain Right Now 9 My pain is burning and stabbing  In the last 24 hours, has pain interfered with the following? General activity 6 Relation with others 4 Enjoyment of life 6 What TIME of  day is your pain at its worst? all Sleep (in general) Poor  Pain is worse with: walking and standing Pain improves with: rest and medication Relief from Meds: 6  Mobility walk with assistance use a cane ability to climb steps?  yes do you drive?  no use a wheelchair needs help with transfers  Function disabled: date disabled . I need assistance with the following:  dressing, bathing, meal prep, household duties and shopping  Neuro/Psych weakness numbness trouble walking spasms depression anxiety  Prior Studies Any changes since last visit?  no  Physicians involved in your care Any changes since last visit?  no   Family History  Problem Relation Age of Onset  . Lung cancer Father   . Heart disease Father   . Heart disease Mother   . Congestive Heart Failure Mother   . Diabetes Mother   . Hyperlipidemia Mother   . Breast cancer Maternal Aunt    Social History   Socioeconomic History  . Marital status: Legally Separated    Spouse name: Jolayne Haines  . Number of children: 2  . Years of education: None  . Highest education level: 12th grade  Social Needs  . Financial resource strain: None  . Food insecurity - worry: None  . Food insecurity - inability: None  . Transportation needs - medical: None  . Transportation needs - non-medical: None  Occupational History  . Occupation: disabled  Tobacco Use  . Smoking status: Current Every Day  Smoker    Packs/day: 1.50    Years: 30.00    Pack years: 45.00    Types: Cigarettes    Start date: 06/08/1982  . Smokeless tobacco: Never Used  Substance and Sexual Activity  . Alcohol use: No  . Drug use: No  . Sexual activity: Yes    Birth control/protection: None  Other Topics Concern  . None  Social History Narrative   Disabled since age 38 lives with wife and children      Patient is right-handed. He is married, lives in a 1 story house, has a ramp to enter. Drinks 64oz of Mtn. Dew a day. Prior to CVA was walking  daily. 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   Past Surgical History:  Procedure Laterality Date  . CHOLECYSTECTOMY    . CORONARY ARTERY BYPASS GRAFT  2010   LIMA to LAD, SVG to diagonal, SVG to OM1 and OM 2, SVG to RCA  . DENTAL SURGERY  2003  . GASTRIC BYPASS  2010  . HERNIA REPAIR  2011, 2012  . INCISIONAL HERNIA REPAIR N/A 07/23/2013   Procedure: HERNIA REPAIR INCISIONAL WITH MESH;  Surgeon: Jamesetta So, MD;  Location: [facility];  Service: General;  Laterality: N/A;  . INSERTION OF MESH N/A 07/23/2013   Procedure: INSERTION OF MESH;  Surgeon: Jamesetta So, MD;  Location: [facility];  Service: General;  Laterality: N/A;  . LEFT HEART CATHETERIZATION WITH CORONARY/GRAFT ANGIOGRAM N/A 11/01/2013   Procedure: LEFT HEART CATHETERIZATION WITH Beatrix Fetters;  Surgeon: Blane Ohara, MD;  Location: [facility] CATH LAB;  Service: Cardiovascular;  Laterality: N/A;  . TOE AMPUTATION  1998   right 1st and 2nd toe  . TONSILECTOMY, ADENOIDECTOMY, BILATERAL MYRINGOTOMY AND TUBES    . TONSILLECTOMY    . VENTRAL HERNIA REPAIR N/A 10/28/2012   Procedure: LAPAROSCOPIC VENTRAL HERNIA;  Surgeon: Donato Heinz, MD;  Location: [facility];  Service: General;  Laterality: N/A;   Past Medical History:  Diagnosis Date  . Anaphylaxis    IGE mediated  . Anxiety   . Arthritis   . Cardiomyopathy ([place])   . CHF (congestive heart failure) ([place])   . COPD (chronic obstructive pulmonary disease) ([place])   . Coronary atherosclerosis of native coronary artery    Multivessel status post CABG 2010  . Depression   . Essential hypertension, benign   . History of CVA (cerebrovascular accident) 01/2012   Right posterior frontal cortical and subcortical brain by MRI, no hemorrhage. Carotid Dopplers showed only 1-50% bilateral ICA stenoses. Echocardiogram showed LVEF 50%, no major valvular abnormalities.  . Mixed hyperlipidemia   . Myocardial infarction ([place]) 2010  . OSA (obstructive sleep apnea)   .  Stroke ([place])   . Type 2 diabetes mellitus (HCC)    BP 111/69   Pulse 80   SpO2 94%   Opioid Risk Score:   Fall Risk Score:  `1  Depression screen PHQ 2/9  No flowsheet data found.   Review of Systems  Constitutional: Positive for unexpected weight change.  HENT: Negative.   Eyes: Negative.   Respiratory: Negative.   Cardiovascular: Negative.   Gastrointestinal: Negative.   Endocrine: Negative.   Genitourinary: Negative.   Musculoskeletal: Positive for gait problem and joint swelling.  Skin: Positive for rash.  Allergic/Immunologic: Negative.   Hematological: Negative.   Psychiatric/Behavioral: Negative.   All other systems reviewed and are negative.      Objective:   Physical Exam  Constitutional:  He is oriented to person, place, and time. He appears well-developed and well-nourished. No distress.  HENT:  Head: Normocephalic and atraumatic.  Eyes: Conjunctivae and EOM are normal. Pupils are equal, round, and reactive to light. Right eye exhibits no discharge. Left eye exhibits no discharge. No scleral icterus.  Neck: Normal range of motion. Neck supple. No JVD present.  Cardiovascular: Normal rate, regular rhythm and normal heart sounds. Exam reveals no friction rub.  No murmur heard. Pulmonary/Chest: Effort normal and breath sounds normal. No stridor. No respiratory distress. He has no wheezes.  Abdominal: Soft. Bowel sounds are normal. He exhibits no distension. There is no tenderness.  Neurological: He is alert and oriented to person, place, and time.  Skin: Skin is warm and dry. He is not diaphoretic.  Psychiatric: He has a normal mood and affect.  Nursing note and vitals reviewed.   Left 2- Delt 2- Biceps 0/5 in the left finger and wrist flexors and extensors. 3- hip knee extensor synergy on the left.  Trace hip flexion.  5/5 strength in the right deltoid, bicep, tricep, grip, hip flexor, knee extensor, dorsiflexor and plantar  Patient has Tenderness with  palpation as well as pain with resisted left ankle dorsiflexion.  There is clonus at the left ankle  Deep tendon reflexes 3+ at the left knee Tone MAS 3 at the left finger flexors MAS 2 at the left thumb flexor MAS 3/4 in the left ankle plantar flexors and foot inverters.    Assessment & Plan:  1.  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  Botox 300U  FDP 25 FDS 25 FCR 25  Gastroc  50U Med 50 U Lat  Soleus 25 U lat 25U med  Tib post  75 U  As discussed with patient may have to increase dose.  We discussed that subsequent injections can be performed at 34-month intervals.  We will continue his outpatient physical therapy and occupational therapy for now.  I have also advised the patient to increase baclofen to 10 mg 3 times daily.  If this is not helpful may need to increase the dose or switch to tizanidine   patient and wife given Botox education handout

## 2017-09-30 DIAGNOSIS — M79662 Pain in left lower leg: Secondary | ICD-10-CM | POA: Diagnosis not present

## 2017-09-30 DIAGNOSIS — M79605 Pain in left leg: Secondary | ICD-10-CM | POA: Diagnosis not present

## 2017-10-01 ENCOUNTER — Other Ambulatory Visit: Payer: Self-pay | Admitting: Cardiology

## 2017-10-01 DIAGNOSIS — I5022 Chronic systolic (congestive) heart failure: Secondary | ICD-10-CM | POA: Diagnosis not present

## 2017-10-01 DIAGNOSIS — I110 Hypertensive heart disease with heart failure: Secondary | ICD-10-CM | POA: Diagnosis not present

## 2017-10-01 DIAGNOSIS — I255 Ischemic cardiomyopathy: Secondary | ICD-10-CM | POA: Diagnosis not present

## 2017-10-01 DIAGNOSIS — I2510 Atherosclerotic heart disease of native coronary artery without angina pectoris: Secondary | ICD-10-CM | POA: Diagnosis not present

## 2017-10-01 DIAGNOSIS — E119 Type 2 diabetes mellitus without complications: Secondary | ICD-10-CM | POA: Diagnosis not present

## 2017-10-01 DIAGNOSIS — I69354 Hemiplegia and hemiparesis following cerebral infarction affecting left non-dominant side: Secondary | ICD-10-CM | POA: Diagnosis not present

## 2017-10-03 DIAGNOSIS — I5022 Chronic systolic (congestive) heart failure: Secondary | ICD-10-CM | POA: Diagnosis not present

## 2017-10-03 DIAGNOSIS — I255 Ischemic cardiomyopathy: Secondary | ICD-10-CM | POA: Diagnosis not present

## 2017-10-03 DIAGNOSIS — I2510 Atherosclerotic heart disease of native coronary artery without angina pectoris: Secondary | ICD-10-CM | POA: Diagnosis not present

## 2017-10-03 DIAGNOSIS — I110 Hypertensive heart disease with heart failure: Secondary | ICD-10-CM | POA: Diagnosis not present

## 2017-10-03 DIAGNOSIS — E119 Type 2 diabetes mellitus without complications: Secondary | ICD-10-CM | POA: Diagnosis not present

## 2017-10-03 DIAGNOSIS — I69354 Hemiplegia and hemiparesis following cerebral infarction affecting left non-dominant side: Secondary | ICD-10-CM | POA: Diagnosis not present

## 2017-10-07 DIAGNOSIS — I5022 Chronic systolic (congestive) heart failure: Secondary | ICD-10-CM | POA: Diagnosis not present

## 2017-10-07 DIAGNOSIS — E119 Type 2 diabetes mellitus without complications: Secondary | ICD-10-CM | POA: Diagnosis not present

## 2017-10-07 DIAGNOSIS — I255 Ischemic cardiomyopathy: Secondary | ICD-10-CM | POA: Diagnosis not present

## 2017-10-07 DIAGNOSIS — I110 Hypertensive heart disease with heart failure: Secondary | ICD-10-CM | POA: Diagnosis not present

## 2017-10-07 DIAGNOSIS — I2510 Atherosclerotic heart disease of native coronary artery without angina pectoris: Secondary | ICD-10-CM | POA: Diagnosis not present

## 2017-10-07 DIAGNOSIS — I69354 Hemiplegia and hemiparesis following cerebral infarction affecting left non-dominant side: Secondary | ICD-10-CM | POA: Diagnosis not present

## 2017-10-09 ENCOUNTER — Telehealth: Payer: Self-pay | Admitting: Cardiology

## 2017-10-09 NOTE — Telephone Encounter (Signed)
Needs labs reprinted again pick up 10/10/17 by noon

## 2017-10-09 NOTE — Telephone Encounter (Signed)
Lab orders printed and placed at front desk.

## 2017-10-10 DIAGNOSIS — I5022 Chronic systolic (congestive) heart failure: Secondary | ICD-10-CM | POA: Diagnosis not present

## 2017-10-10 DIAGNOSIS — I110 Hypertensive heart disease with heart failure: Secondary | ICD-10-CM | POA: Diagnosis not present

## 2017-10-10 DIAGNOSIS — I69354 Hemiplegia and hemiparesis following cerebral infarction affecting left non-dominant side: Secondary | ICD-10-CM | POA: Diagnosis not present

## 2017-10-10 DIAGNOSIS — E119 Type 2 diabetes mellitus without complications: Secondary | ICD-10-CM | POA: Diagnosis not present

## 2017-10-10 DIAGNOSIS — I2510 Atherosclerotic heart disease of native coronary artery without angina pectoris: Secondary | ICD-10-CM | POA: Diagnosis not present

## 2017-10-10 DIAGNOSIS — I255 Ischemic cardiomyopathy: Secondary | ICD-10-CM | POA: Diagnosis not present

## 2017-10-13 ENCOUNTER — Telehealth: Payer: Self-pay

## 2017-10-13 NOTE — Telephone Encounter (Signed)
-----   Message from Merlene Laughter, LPN sent at 0/09/8331 10:38 AM EDT -----   ----- Message ----- From: Satira Sark, MD Sent: 10/13/2017  10:35 AM To: Merlene Laughter, LPN  Results reviewed.  Creatinine has bumped up some to 1.4 but potassium is normal.  Continue with current dose of Entresto without further up titration. A copy of this test should be forwarded to Caryl Bis, MD.

## 2017-10-13 NOTE — Telephone Encounter (Signed)
Patient notified. Routed to PCP 

## 2017-10-14 ENCOUNTER — Ambulatory Visit (HOSPITAL_BASED_OUTPATIENT_CLINIC_OR_DEPARTMENT_OTHER): Payer: Medicare HMO | Admitting: Physical Medicine & Rehabilitation

## 2017-10-14 ENCOUNTER — Encounter: Payer: Self-pay | Admitting: Physical Medicine & Rehabilitation

## 2017-10-14 ENCOUNTER — Encounter: Payer: Medicare HMO | Attending: Physical Medicine & Rehabilitation

## 2017-10-14 VITALS — BP 120/71 | HR 73 | Ht 71.0 in | Wt 271.0 lb

## 2017-10-14 DIAGNOSIS — I2510 Atherosclerotic heart disease of native coronary artery without angina pectoris: Secondary | ICD-10-CM | POA: Diagnosis not present

## 2017-10-14 DIAGNOSIS — I252 Old myocardial infarction: Secondary | ICD-10-CM | POA: Insufficient documentation

## 2017-10-14 DIAGNOSIS — I69354 Hemiplegia and hemiparesis following cerebral infarction affecting left non-dominant side: Secondary | ICD-10-CM | POA: Diagnosis not present

## 2017-10-14 DIAGNOSIS — F419 Anxiety disorder, unspecified: Secondary | ICD-10-CM | POA: Diagnosis not present

## 2017-10-14 DIAGNOSIS — I110 Hypertensive heart disease with heart failure: Secondary | ICD-10-CM | POA: Insufficient documentation

## 2017-10-14 DIAGNOSIS — E119 Type 2 diabetes mellitus without complications: Secondary | ICD-10-CM | POA: Diagnosis not present

## 2017-10-14 DIAGNOSIS — E782 Mixed hyperlipidemia: Secondary | ICD-10-CM | POA: Insufficient documentation

## 2017-10-14 DIAGNOSIS — J449 Chronic obstructive pulmonary disease, unspecified: Secondary | ICD-10-CM | POA: Insufficient documentation

## 2017-10-14 DIAGNOSIS — I509 Heart failure, unspecified: Secondary | ICD-10-CM | POA: Insufficient documentation

## 2017-10-14 DIAGNOSIS — M1990 Unspecified osteoarthritis, unspecified site: Secondary | ICD-10-CM | POA: Insufficient documentation

## 2017-10-14 DIAGNOSIS — F17210 Nicotine dependence, cigarettes, uncomplicated: Secondary | ICD-10-CM | POA: Insufficient documentation

## 2017-10-14 DIAGNOSIS — G4733 Obstructive sleep apnea (adult) (pediatric): Secondary | ICD-10-CM | POA: Diagnosis not present

## 2017-10-14 DIAGNOSIS — G8110 Spastic hemiplegia affecting unspecified side: Secondary | ICD-10-CM

## 2017-10-14 NOTE — Progress Notes (Signed)
Botox Injection for spasticity using needle EMG guidance  Dilution: 50 Units/ml Indication: Severe spasticity which interferes with ADL,mobility and/or  hygiene and is unresponsive to medication management and other conservative care Informed consent was obtained after describing risks and benefits of the procedure with the patient. This includes bleeding, bruising, infection, excessive weakness, or medication side effects. A REMS form is on file and signed. Needle: 25g 2" needle electrode Number of units per muscle  FDP 25 FDS 25 FCR 25  Gastroc  50U Med 50 U Lat  Soleus 25 U lat 25U med  Tib post  75 U  All injections were done after obtaining appropriate EMG activity and after negative drawback for blood. The patient tolerated the procedure well. Post procedure instructions were given. A followup appointment was made.

## 2017-10-14 NOTE — Patient Instructions (Signed)

## 2017-10-17 DIAGNOSIS — E119 Type 2 diabetes mellitus without complications: Secondary | ICD-10-CM | POA: Diagnosis not present

## 2017-10-17 DIAGNOSIS — I69354 Hemiplegia and hemiparesis following cerebral infarction affecting left non-dominant side: Secondary | ICD-10-CM | POA: Diagnosis not present

## 2017-10-17 DIAGNOSIS — I2510 Atherosclerotic heart disease of native coronary artery without angina pectoris: Secondary | ICD-10-CM | POA: Diagnosis not present

## 2017-10-17 DIAGNOSIS — I255 Ischemic cardiomyopathy: Secondary | ICD-10-CM | POA: Diagnosis not present

## 2017-10-20 DIAGNOSIS — I2510 Atherosclerotic heart disease of native coronary artery without angina pectoris: Secondary | ICD-10-CM | POA: Diagnosis not present

## 2017-10-20 DIAGNOSIS — E119 Type 2 diabetes mellitus without complications: Secondary | ICD-10-CM | POA: Diagnosis not present

## 2017-10-20 DIAGNOSIS — I69354 Hemiplegia and hemiparesis following cerebral infarction affecting left non-dominant side: Secondary | ICD-10-CM | POA: Diagnosis not present

## 2017-10-20 DIAGNOSIS — I255 Ischemic cardiomyopathy: Secondary | ICD-10-CM | POA: Diagnosis not present

## 2017-10-21 ENCOUNTER — Telehealth: Payer: Self-pay

## 2017-10-21 ENCOUNTER — Ambulatory Visit (INDEPENDENT_AMBULATORY_CARE_PROVIDER_SITE_OTHER): Payer: Medicare HMO | Admitting: Neurology

## 2017-10-21 ENCOUNTER — Encounter: Payer: Self-pay | Admitting: Neurology

## 2017-10-21 VITALS — BP 130/64 | HR 88 | Ht 71.0 in | Wt 269.5 lb

## 2017-10-21 DIAGNOSIS — E785 Hyperlipidemia, unspecified: Secondary | ICD-10-CM | POA: Diagnosis not present

## 2017-10-21 DIAGNOSIS — R483 Visual agnosia: Secondary | ICD-10-CM

## 2017-10-21 DIAGNOSIS — Z8673 Personal history of transient ischemic attack (TIA), and cerebral infarction without residual deficits: Secondary | ICD-10-CM

## 2017-10-21 DIAGNOSIS — E118 Type 2 diabetes mellitus with unspecified complications: Secondary | ICD-10-CM | POA: Diagnosis not present

## 2017-10-21 DIAGNOSIS — I10 Essential (primary) hypertension: Secondary | ICD-10-CM | POA: Diagnosis not present

## 2017-10-21 DIAGNOSIS — Z794 Long term (current) use of insulin: Secondary | ICD-10-CM

## 2017-10-21 DIAGNOSIS — R4189 Other symptoms and signs involving cognitive functions and awareness: Secondary | ICD-10-CM

## 2017-10-21 DIAGNOSIS — F17200 Nicotine dependence, unspecified, uncomplicated: Secondary | ICD-10-CM

## 2017-10-21 DIAGNOSIS — I69354 Hemiplegia and hemiparesis following cerebral infarction affecting left non-dominant side: Secondary | ICD-10-CM

## 2017-10-21 DIAGNOSIS — F17210 Nicotine dependence, cigarettes, uncomplicated: Secondary | ICD-10-CM

## 2017-10-21 DIAGNOSIS — I429 Cardiomyopathy, unspecified: Secondary | ICD-10-CM

## 2017-10-21 NOTE — Telephone Encounter (Addendum)
Pt has an appt today at 2p, Pt was reported to have had another CVA, records not in [place], called both Pt nad wife, Jolayne Haines, LMOVM for call back. [employer], they do not have any information on this either.  Was able to reach Pt, he said he was seen [facility]. Called medical records, spoke with Harriet Pho, she asked I fax request on our letter head to [phone], STAT

## 2017-10-21 NOTE — Progress Notes (Signed)
NEUROLOGY FOLLOW UP OFFICE NOTE  HECTOR TAFT [id number]  HISTORY OF PRESENT ILLNESS: RODRIGO MCGRANAHAN is a 52 year old right-handed male with hypertension, type 2 diabetes mellitus, CAD, cardiomyopathy, COPD, tobacco use disorder, depression and history of prior strokes who follows up for another recent stroke.  History supplemented by ED notes, wife (via phone) and discussion with his PCP, Dr. Quillian Quince, via phone.  UPDATE: He was seen in the ED at [facility] on 09/22/17 for headache.  CT of head showed no acute findings but was read as demonstrating a chronic appearing infarct in the right posterior limb of internal capsule that was not present on prior CT from 06/14/17.  He also complained of left leg pain.  He reportedly had an elevated d dimer.  CT chest and venous doppler of lower extremity were negative for DVT.  However, the started him on Xarelto.  He was told to stop Plavix by his PCP.  I contacted his PCP, Dr. Quillian Quince, who reviewed the ED notes and is also unsure why they started Xarelto when imaging was negative for DVT.  For the past 3 weeks, he says he sometimes doesn't recognize his children.  One time, his daughter was in the room and he didn't know who it was until somebody said her name.  Understandably, it scares him.  He has been seeing Dr. Letta Pate for Botox.   HISTORY: He was admitted to [facility] on 06/14/17 with sudden onset left sided weakness and numbness and difficulty speaking.  He initially presented to [facility] where he received tPA with NIHSS of 15 and was then transferred to [facility].    CT of head from [facility] reportedly showed "acute infarction involving portions of the posterior right basal ganglia, right corona radiata and body of the right caudate nucleus".  MRI of brain at [facility] confirmed acute right basal ganglia infarct as well as remote right frontal infarct.  MRA of head and carotid doppler revealed  no significant intracranial or extracranial arterial stenosis or occlusion.  He was unable to have CTA due to allergy to iodinated contrast.    Echocardiogram with bubble study showed EF of 25-30% with no evidence of PFO or ASD.  He was evaluated by cardiology for ischemic cardiomyopathy.  Lexiscan nuclear perfusion study revealed EF 18% with large infarct involving the apex and periapical anterior wall with no evidence of ischemia.  He was advised to follow up with outpatient cardiology.  LDL was 123 and triglycerides were 237.  Hgb A1c was 5.7.  He was already taking ASA 325mg  but not daily, as well as Plavix.  He is continued on ASA and Plavix.  He was continued on rosuvastatin 40mg , Zetia 10mg  and Tricor 145mg  daily.   He is currently plegic on the left side.  He is undergoing PT/OT 2 to 3 times a week.  He reports that his left side is painful. It is an aching and muscle spasm pain.  He is on gabapentin.  PAST MEDICAL HISTORY: Past Medical History:  Diagnosis Date  . Anaphylaxis    IGE mediated  . Anxiety   . Arthritis   . Cardiomyopathy ([place])   . CHF (congestive heart failure) ([place])   . COPD (chronic obstructive pulmonary disease) ([place])   . Coronary atherosclerosis of native coronary artery    Multivessel status post CABG 2010  . Depression   . Essential hypertension, benign   . History of CVA (cerebrovascular accident) 01/2012  Right posterior frontal cortical and subcortical brain by MRI, no hemorrhage. Carotid Dopplers showed only 1-50% bilateral ICA stenoses. Echocardiogram showed LVEF 50%, no major valvular abnormalities.  . Mixed hyperlipidemia   . Myocardial infarction ([place]) 2010  . OSA (obstructive sleep apnea)   . Stroke ([place])   . Type 2 diabetes mellitus (HCC)     MEDICATIONS: Current Outpatient Medications on File Prior to Visit  Medication Sig Dispense Refill  . albuterol (PROVENTIL HFA;VENTOLIN HFA) 108 (90 BASE) MCG/ACT inhaler Inhale 2 puffs into the lungs every 6  (six) hours as needed for wheezing or shortness of breath.    . ALPRAZolam (XANAX) 1 MG tablet Take 1 mg by mouth 3 (three) times daily as needed for anxiety.     [name] amLODipine (NORVASC) 5 MG tablet Take 1 tablet (5 mg total) by mouth daily. (Patient taking differently: Take 5 mg by mouth daily. ) 30 tablet 1  . aspirin EC 325 MG tablet Take 325 mg by mouth daily.    [name] atorvastatin (LIPITOR) 20 MG tablet Take 40 mg by mouth every evening.     . baclofen (LIORESAL) 10 MG tablet Take 1 tablet (10 mg total) by mouth 3 (three) times daily as needed for muscle spasms. 90 each 1  . carvedilol (COREG) 6.25 MG tablet Take 6.25 mg by mouth 2 (two) times daily with a meal.    . Cholecalciferol (VITAMIN D) 2000 units tablet Take 2,000 Units by mouth daily at 12 noon.    . DULoxetine (CYMBALTA) 60 MG capsule Take 60 mg by mouth daily.    . fenofibrate micronized (LOFIBRA) 134 MG capsule Take 134 mg by mouth daily before breakfast.     . fluticasone (CUTIVATE) 0.05 % cream Apply 1 application topically 2 (two) times daily.    . furosemide (LASIX) 20 MG tablet Take 20 mg by mouth.    . gabapentin (NEURONTIN) 300 MG capsule Take 300 mg by mouth 2 (two) times daily. 600mg  QHS    . isosorbide mononitrate (IMDUR) 60 MG 24 hr tablet Take 60 mg by mouth 2 (two) times daily.     . nitroGLYCERIN (NITROSTAT) 0.4 MG SL tablet Place 0.4 mg under the tongue every 5 (five) minutes as needed. For chest pain    . pantoprazole (PROTONIX) 40 MG tablet Take 40 mg by mouth daily.    [name] PARoxetine (PAXIL) 20 MG tablet Take 40 mg by mouth daily.     . Rivaroxaban (XARELTO) 15 MG TABS tablet Take 15 mg by mouth 2 (two) times daily with a meal.    . sacubitril-valsartan (ENTRESTO) 49-51 MG Take 1 tablet by mouth 2 (two) times daily. 60 tablet 1  . traMADol (ULTRAM) 50 MG tablet Take 50 mg by mouth every 6 (six) hours as needed.    . vitamin B-12 (CYANOCOBALAMIN) 1000 MCG tablet Take 1,000 mcg by mouth daily at 12 noon.     No  current facility-administered medications on file prior to visit.     ALLERGIES: Allergies  Allergen Reactions  . Contrast Media [Iodinated Diagnostic Agents] Anaphylaxis, Shortness Of Breath, Swelling and Rash    Isovue contrast is most acceptable agent based on previous experience and testing with premedications  . Ibuprofen Anaphylaxis, Hives and Swelling  . Nsaids Anaphylaxis, Hives, Swelling and Rash    FAMILY HISTORY: Family History  Problem Relation Age of Onset  . Lung cancer Father   . Heart disease Father   . Heart disease Mother   .  Congestive Heart Failure Mother   . Diabetes Mother   . Hyperlipidemia Mother   . Breast cancer Maternal Aunt     SOCIAL HISTORY: Social History   Socioeconomic History  . Marital status: Legally Separated    Spouse name: Jolayne Haines  . Number of children: 2  . Years of education: Not on file  . Highest education level: 12th grade  Occupational History  . Occupation: disabled  Social Needs  . Financial resource strain: Not on file  . Food insecurity:    Worry: Not on file    Inability: Not on file  . Transportation needs:    Medical: Not on file    Non-medical: Not on file  Tobacco Use  . Smoking status: Current Every Day Smoker    Packs/day: 1.50    Years: 30.00    Pack years: 45.00    Types: Cigarettes    Start date: 06/08/1982  . Smokeless tobacco: Never Used  Substance and Sexual Activity  . Alcohol use: No  . Drug use: No  . Sexual activity: Yes    Birth control/protection: None  Lifestyle  . Physical activity:    Days per week: Not on file    Minutes per session: Not on file  . Stress: Not on file  Relationships  . Social connections:    Talks on phone: Not on file    Gets together: Not on file    Attends religious service: Not on file    Active member of club or organization: Not on file    Attends meetings of clubs or organizations: Not on file    Relationship status: Not on file  . Intimate partner  violence:    Fear of current or ex partner: Not on file    Emotionally abused: Not on file    Physically abused: Not on file    Forced sexual activity: Not on file  Other Topics Concern  . Not on file  Social History Narrative   Disabled since age 24 lives with wife and children      Patient is right-handed. He is married, lives in a 1 story house, has a ramp to enter. Drinks 64oz of Mtn. Dew a day. Prior to CVA was walking daily. 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    REVIEW OF SYSTEMS: Constitutional: No fevers, chills, or sweats, no generalized fatigue, change in appetite Eyes: No visual changes, double vision, eye pain Ear, nose and throat: No hearing loss, ear pain, nasal congestion, sore throat Cardiovascular: No chest pain, palpitations Respiratory:  No shortness of breath at rest or with exertion, wheezes GastrointestinaI: No nausea, vomiting, diarrhea, abdominal pain, fecal incontinence Genitourinary:  No dysuria, urinary retention or frequency Musculoskeletal:  pain Integumentary: No rash, pruritus, skin lesions Neurological: as above Psychiatric: depression Endocrine: No palpitations, fatigue, diaphoresis, mood swings, change in appetite, change in weight, increased thirst Hematologic/Lymphatic:  No purpura, petechiae. Allergic/Immunologic: no itchy/runny eyes, nasal congestion, recent allergic reactions, rashes  PHYSICAL EXAM: Vitals:   10/21/17 1406  BP: 130/64  Pulse: 88  SpO2: 94%   General: No acute distress.   Head:  Normocephalic/atraumatic Eyes:  Fundi examined but not visualized Neck: supple, no paraspinal tenderness, full range of motion Heart:  Regular rate and rhythm Lungs:  Clear to auscultation bilaterally Back: No paraspinal tenderness Neurological Exam: alert and oriented to person, place, and time, short term memory poor, remote memory fair, fund of knowledge intact, attention and concentration intact, speech fluent  and not  dysarthric, language intact. Cranial nerves:  decreased left V2-V3.  Otherwise, CN II-XII intact.  Bulk & Tone: flaccid in left upper extremity, no fasciculations.  Motor:  Left upper extremity plegic, 2+/5 left hip flexion, plegic left knee flexion, left foot drop, left knee extension and right side 5/5.  Sensation:  decreased pinprick in left lower extremity and vibration sensation decreased bilaterally. Deep Tendon Reflexes:  3+ left upper and lower extremities with sustained clonus in the left ankle, 5/5 on right.  Finger to nose testing:  Without dysmetria on right, unable to test left.  Gait:  In wheelchair.  IMPRESSION: 1.  Left hemiplegia as late effect of stroke, likely secondary to small vessel disease.  It appears that the right basal ganglia infarct is not a new finding as it was reportedly seen as an acute infarct on the head CT from 06/14/17 (as per hospital notes from [place]). 2.  Prosopagnosia.  New symptoms over past 3 weeks. 3.  Tobacco use disorder.  Currently using patch 4.  Hypertension 5.  Hyperlipidemia 6.  Cardiomyopathy 7.  Type 2 diabetes 8.  Vascular cognitive impairment  PLAN: 1.  There is no indication for Xarelto since imaging was negative for DVT or PE.  Advised to discontinue Xarelto and restart Plavix with ASA. 2.  MRI of brain to evaluate for new strokes (bilateral temporal/occipital strokes) that would be causing prosopagnosia. 3.  Blood pressure/glycemic control 4.  Statin therapy (LDL goal less than 70) 5.  Tobacco cessation counseling (CPT 99406):  Tobacco use disorder with history of stroke  - Currently smoking 1.5 packs/day   - Patient was informed of the dangers of tobacco abuse including stroke, cancer, and MI, as well as benefits of tobacco cessation. - Patient is willing to quit at this time and is wearing the patch. - Approximately 5 mins were spent counseling patient cessation techniques. We discussed various methods to help quit smoking,  including deciding on a date to quit, joining a support group, pharmacological agents- nicotine gum/patch/lozenges, chantix.  - I will reassess his progress at the next follow-up visit 6.  Follow up in 3 months.  30 minutes spent face to face with patient, over 50% spent discussing management.   Metta Clines, DO  CC:  Gar Ponto, MD  Rozann Lesches, MD

## 2017-10-21 NOTE — Patient Instructions (Signed)
1.  Stop Xarelto and restart Plavix 2.  We will repeat MRI of brain to evaluate for new stroke 3.  Continue trying to quit smoking 4.  Take blood pressure, diabetes and cholesterol medication 5.  Follow up in 3 months.

## 2017-10-23 DIAGNOSIS — I2510 Atherosclerotic heart disease of native coronary artery without angina pectoris: Secondary | ICD-10-CM | POA: Diagnosis not present

## 2017-10-23 DIAGNOSIS — E119 Type 2 diabetes mellitus without complications: Secondary | ICD-10-CM | POA: Diagnosis not present

## 2017-10-23 DIAGNOSIS — I69354 Hemiplegia and hemiparesis following cerebral infarction affecting left non-dominant side: Secondary | ICD-10-CM | POA: Diagnosis not present

## 2017-10-23 DIAGNOSIS — I255 Ischemic cardiomyopathy: Secondary | ICD-10-CM | POA: Diagnosis not present

## 2017-10-29 ENCOUNTER — Ambulatory Visit (HOSPITAL_COMMUNITY)
Admission: RE | Admit: 2017-10-29 | Discharge: 2017-10-29 | Disposition: A | Discharge status: Home / Self Care | Payer: Medicare HMO | Source: Ambulatory Visit | Attending: Neurology | Admitting: Neurology

## 2017-10-29 ENCOUNTER — Telehealth: Payer: Self-pay

## 2017-10-29 DIAGNOSIS — K118 Other diseases of salivary glands: Secondary | ICD-10-CM | POA: Insufficient documentation

## 2017-10-29 DIAGNOSIS — I639 Cerebral infarction, unspecified: Secondary | ICD-10-CM | POA: Insufficient documentation

## 2017-10-29 DIAGNOSIS — R51 Headache: Secondary | ICD-10-CM | POA: Diagnosis not present

## 2017-10-29 DIAGNOSIS — G9389 Other specified disorders of brain: Secondary | ICD-10-CM | POA: Insufficient documentation

## 2017-10-29 DIAGNOSIS — R483 Visual agnosia: Secondary | ICD-10-CM

## 2017-10-29 DIAGNOSIS — G8102 Flaccid hemiplegia affecting left dominant side: Secondary | ICD-10-CM | POA: Diagnosis not present

## 2017-10-29 DIAGNOSIS — R262 Difficulty in walking, not elsewhere classified: Secondary | ICD-10-CM | POA: Diagnosis not present

## 2017-10-29 DIAGNOSIS — R41841 Cognitive communication deficit: Secondary | ICD-10-CM | POA: Diagnosis not present

## 2017-10-29 NOTE — Telephone Encounter (Signed)
-----   Message from Pieter Partridge, DO sent at 10/29/2017 12:14 PM EDT ----- MRI of brain reveals old stroke but no new strokes or anything else to explain the problem with recognizing faces.  I would like to schedule him for neuropsychological testing with Dr. Si Raider.

## 2017-10-29 NOTE — Progress Notes (Signed)
Cardiology Office Note  Date: 10/31/2017   ID: [name], DOB Jun 16, 1966, MRN [id number]  PCP: [name] Bis, MD  Primary Cardiologist: [name] Lesches, MD   Chief Complaint  Patient presents with  . Cardiomyopathy    History of Present Illness: [name] is a medically complex 52 y.o. male seen recently in March.  He presents for follow-up today.  States that he has just started in outpatient physical therapy.  He reports no angina, no palpitations or syncope.  Follow-up echocardiogram performed yesterday revealed LVEF approximately 40% with wall motion abnormalties consistent with ischemic cardiomyopathy.  This has improved in comparison to the [facility].  Current cardiac regimen includes aspirin, Norvasc, Lipitor, Coreg, Imdur, Entresto, and as needed nitroglycerin.  Blood pressure has been well controlled.  I reviewed recent lab work from March which is outlined below.  Past Medical History:  Diagnosis Date  . Anaphylaxis    IGE mediated  . Anxiety   . Arthritis   . Cardiomyopathy ([place])   . CHF (congestive heart failure) ([place])   . COPD (chronic obstructive pulmonary disease) ([place])   . Coronary atherosclerosis of native coronary artery    Multivessel status post CABG 2010  . Depression   . Essential hypertension, benign   . History of CVA (cerebrovascular accident) 01/2012   Right posterior frontal cortical and subcortical brain by MRI, no hemorrhage. Carotid Dopplers showed only 1-50% bilateral ICA stenoses. Echocardiogram showed LVEF 50%, no major valvular abnormalities.  . Mixed hyperlipidemia   . Myocardial infarction ([place]) 2010  . OSA (obstructive sleep apnea)   . Stroke ([place])   . Type 2 diabetes mellitus ([place])     Past Surgical History:  Procedure Laterality Date  . CHOLECYSTECTOMY    . CORONARY ARTERY BYPASS GRAFT  2010   LIMA to LAD, SVG to diagonal, SVG to OM1 and OM 2, SVG to RCA  . DENTAL  SURGERY  2003  . GASTRIC BYPASS  2010  . HERNIA REPAIR  2011, 2012  . INCISIONAL HERNIA REPAIR N/A 07/23/2013   Procedure: HERNIA REPAIR INCISIONAL WITH MESH;  Surgeon: Jamesetta So, MD;  Location: [facility];  Service: General;  Laterality: N/A;  . INSERTION OF MESH N/A 07/23/2013   Procedure: INSERTION OF MESH;  Surgeon: Jamesetta So, MD;  Location: [facility];  Service: General;  Laterality: N/A;  . LEFT HEART CATHETERIZATION WITH CORONARY/GRAFT ANGIOGRAM N/A 11/01/2013   Procedure: LEFT HEART CATHETERIZATION WITH Beatrix Fetters;  Surgeon: Blane Ohara, MD;  Location: [facility] CATH LAB;  Service: Cardiovascular;  Laterality: N/A;  . TOE AMPUTATION  1998   right 1st and 2nd toe  . TONSILECTOMY, ADENOIDECTOMY, BILATERAL MYRINGOTOMY AND TUBES    . TONSILLECTOMY    . VENTRAL HERNIA REPAIR N/A 10/28/2012   Procedure: LAPAROSCOPIC VENTRAL HERNIA;  Surgeon: Donato Heinz, MD;  Location: [facility];  Service: General;  Laterality: N/A;    Current Outpatient Medications  Medication Sig Dispense Refill  . albuterol (PROVENTIL HFA;VENTOLIN HFA) 108 (90 BASE) MCG/ACT inhaler Inhale 2 puffs into the lungs every 6 (six) hours as needed for wheezing or shortness of breath.    . ALPRAZolam (XANAX) 1 MG tablet Take 1 mg by mouth 3 (three) times daily as needed for anxiety.     [name] amLODipine (NORVASC) 5 MG tablet Take 1 tablet (5 mg total) by mouth daily. (Patient taking differently: Take 5 mg by mouth daily. ) 30 tablet 1  .  aspirin EC 325 MG tablet Take 325 mg by mouth daily.    [name] atorvastatin (LIPITOR) 20 MG tablet Take 40 mg by mouth every evening.     . baclofen (LIORESAL) 10 MG tablet Take 1 tablet (10 mg total) by mouth 3 (three) times daily as needed for muscle spasms. 90 each 1  . carvedilol (COREG) 6.25 MG tablet Take 6.25 mg by mouth 2 (two) times daily with a meal.    . Cholecalciferol (VITAMIN D) 2000 units tablet Take 2,000 Units by mouth daily at 12 noon.    . DULoxetine (CYMBALTA) 60 MG  capsule Take 60 mg by mouth daily.    . fenofibrate micronized (LOFIBRA) 134 MG capsule Take 134 mg by mouth daily before breakfast.     . fluticasone (CUTIVATE) 0.05 % cream Apply 1 application topically 2 (two) times daily.    . furosemide (LASIX) 20 MG tablet Take 20 mg by mouth.    . gabapentin (NEURONTIN) 300 MG capsule Take 300 mg by mouth 2 (two) times daily. 600mg  QHS    . isosorbide mononitrate (IMDUR) 60 MG 24 hr tablet Take 60 mg by mouth 2 (two) times daily.     . nitroGLYCERIN (NITROSTAT) 0.4 MG SL tablet Place 0.4 mg under the tongue every 5 (five) minutes as needed. For chest pain    . pantoprazole (PROTONIX) 40 MG tablet Take 40 mg by mouth daily.    [name] PARoxetine (PAXIL) 20 MG tablet Take 40 mg by mouth daily.     . sacubitril-valsartan (ENTRESTO) 49-51 MG Take 1 tablet by mouth 2 (two) times daily. 60 tablet 1  . traMADol (ULTRAM) 50 MG tablet Take 50 mg by mouth every 6 (six) hours as needed.    . vitamin B-12 (CYANOCOBALAMIN) 1000 MCG tablet Take 1,000 mcg by mouth daily at 12 noon.     No current facility-administered medications for this visit.    Allergies:  Contrast media [iodinated diagnostic agents]; Ibuprofen; and Nsaids   Social History: The patient  reports that he has been smoking cigarettes.  He started smoking about 35 years ago. He has a 45.00 pack-year smoking history. He has never used smokeless tobacco. He reports that he does not drink alcohol or use drugs.   ROS:  Please see the history of present illness. Otherwise, complete review of systems is positive for left-sided weakness.  All other systems are reviewed and negative.   Physical Exam: VS:  BP 103/65   Pulse 73   Ht 5\' 11"  (1.803 m)   Wt 270 lb (122.5 kg)   SpO2 97%   BMI 37.66 kg/m , BMI Body mass index is 37.66 kg/m.  Wt Readings from Last 3 Encounters:  10/31/17 270 lb (122.5 kg)  10/21/17 269 lb 8 oz (122.2 kg)  10/14/17 271 lb (122.9 kg)    General: Overweight male in  wheelchair. HEENT: Conjunctiva and lids normal, oropharynx clear. Neck: Supple, no elevated JVP or carotid bruits, no thyromegaly. Lungs: Clear to auscultation, nonlabored breathing at rest. Cardiac: Regular rate and rhythm, no S3 or significant systolic murmur, no pericardial rub. Abdomen: Soft, nontender, bowel sounds present. Extremities: Mild leg edema, distal pulses 2+. Skin: Warm and dry. Musculoskeletal: No kyphosis. Neuropsychiatric: Alert and oriented x3, affect grossly appropriate.  ECG: I personally reviewed the tracing from 07/28/2017 which showed sinus rhythm with vertical axis, anterolateral infarct pattern, nonspecific ST changes.  Recent Labwork: 07/28/2017: B Natriuretic Peptide 138.0; BUN 22; Creatinine, Ser 1.09; Hemoglobin 14.9; Platelets 254;  Potassium 3.3; Sodium 139     Component Value Date/Time   CHOL 156 11/01/2013 0710   TRIG 185 (H) 11/01/2013 0710   HDL 27 (L) 11/01/2013 0710   CHOLHDL 5.8 11/01/2013 0710   VLDL 37 11/01/2013 0710   LDLCALC 92 11/01/2013 0710  March 2019: BUN 24, creatinine 1.4, potassium 4.2  Other Studies Reviewed Today:  Lexiscan Myoview 06/18/2017([facility]): FINDINGS: Large fixed defect is present involving the apex and large portion of the periapical anterior wall. No evidence of ischemia. Wall motion analysis reveals apical dyskinesis and akinesis. Left ventricular ejection fraction is calculated to be  18%.  IMPRESSION: Large infarct involving the apex and periapical anterior wall. No evidence of ischemia. Apical akinesis and dyskinesis with ejection fraction of 18%.  Lower extremity venous Dopplers 09/04/2017: Final Interpretation: Right: No evidence of common femoral vein obstruction. Left: No evidence of deep vein thrombosis in the lower extremity. No indirect evidence of obstruction proximal to the inguinal ligament. There is no evidence of deep vein thrombosis in the lower extremity.  Echocardiogram 10/30/2017: Study  Conclusions  - Left ventricle: The cavity size was mildly dilated. Wall   thickness was increased in a pattern of mild LVH. The estimated   ejection fraction was approximately 40% based on limited images.   Indeterminate diastolic function. There is akinesis of the   anteroseptal myocardium. - Aortic valve: Mildly calcified annulus. Trileaflet. There was   mild regurgitation. - Mitral valve: Mildly calcified annulus. There was trivial   regurgitation. - Right ventricle: Poorly visualized. - Atrial septum: No defect or patent foramen ovale was identified. - Tricuspid valve: There was trivial regurgitation. - Pulmonary arteries: Systolic pressure could not be accurately   estimated. - Pericardium, extracardiac: There was no pericardial effusion.  Assessment and Plan:  1.  Ischemic cardiomyopathy with LVEF approximately 40% based on follow-up echocardiogram.  He is tolerating current regimen including Entresto and lab work is overall stable.  No adjustments were made today.  Follow-up with BMET in the next 6 weeks.  2.  Multivessel CAD status post CABG with patent bypass grafts as of 2015 and more recently a Myoview study at [facility] showing large region of anterior and periapical scar with no active ischemia.  3.  Mixed hyperlipidemia on Lipitor.  4.  Essential hypertension, blood pressure is well controlled today.  5.  Post stroke with left hemiparesis.  Now in outpatient rehabilitation.  Current medicines were reviewed with the patient today.   Orders Placed This Encounter  Procedures  . Basic metabolic panel    Disposition: Follow-up in 6 weeks.  Signed, Satira Sark, MD, [facility] 10/31/2017 2:34 PM    [facility] at [address], [place], [place] [address] Phone: [phone]; Fax: [phone]

## 2017-10-29 NOTE — Telephone Encounter (Signed)
Called and spoke with Pt, advsd him of MRI result and Dr Si Raider recommendation. Pt agreed and I trx him to [place] to schedule.

## 2017-10-30 ENCOUNTER — Ambulatory Visit (INDEPENDENT_AMBULATORY_CARE_PROVIDER_SITE_OTHER): Payer: Medicare HMO

## 2017-10-30 ENCOUNTER — Other Ambulatory Visit: Payer: Self-pay

## 2017-10-30 DIAGNOSIS — I255 Ischemic cardiomyopathy: Secondary | ICD-10-CM | POA: Diagnosis not present

## 2017-10-30 DIAGNOSIS — G8102 Flaccid hemiplegia affecting left dominant side: Secondary | ICD-10-CM | POA: Diagnosis not present

## 2017-10-30 DIAGNOSIS — R262 Difficulty in walking, not elsewhere classified: Secondary | ICD-10-CM | POA: Diagnosis not present

## 2017-10-30 DIAGNOSIS — R41841 Cognitive communication deficit: Secondary | ICD-10-CM | POA: Diagnosis not present

## 2017-10-31 ENCOUNTER — Ambulatory Visit (INDEPENDENT_AMBULATORY_CARE_PROVIDER_SITE_OTHER): Payer: Medicare HMO | Admitting: Cardiology

## 2017-10-31 ENCOUNTER — Encounter: Payer: Self-pay | Admitting: Cardiology

## 2017-10-31 VITALS — BP 103/65 | HR 73 | Ht 71.0 in | Wt 270.0 lb

## 2017-10-31 DIAGNOSIS — I10 Essential (primary) hypertension: Secondary | ICD-10-CM

## 2017-10-31 DIAGNOSIS — E782 Mixed hyperlipidemia: Secondary | ICD-10-CM

## 2017-10-31 DIAGNOSIS — I25119 Atherosclerotic heart disease of native coronary artery with unspecified angina pectoris: Secondary | ICD-10-CM

## 2017-10-31 DIAGNOSIS — Z8673 Personal history of transient ischemic attack (TIA), and cerebral infarction without residual deficits: Secondary | ICD-10-CM

## 2017-10-31 DIAGNOSIS — I255 Ischemic cardiomyopathy: Secondary | ICD-10-CM | POA: Diagnosis not present

## 2017-10-31 NOTE — Patient Instructions (Signed)
Medication Instructions:   Your physician recommends that you continue on your current medications as directed. Please refer to the Current Medication list given to you today.  Labwork:  Your physician recommends that you return for lab work in: 6 weeks before your next visit to check your BMET.  Testing/Procedures:  NONE  Follow-Up:  Your physician recommends that you schedule a follow-up appointment in: 6 weeks.  Any Other Special Instructions Will Be Listed Below (If Applicable).  If you need a refill on your cardiac medications before your next appointment, please call your pharmacy.

## 2017-11-02 DIAGNOSIS — I2510 Atherosclerotic heart disease of native coronary artery without angina pectoris: Secondary | ICD-10-CM | POA: Diagnosis not present

## 2017-11-02 DIAGNOSIS — I69398 Other sequelae of cerebral infarction: Secondary | ICD-10-CM | POA: Diagnosis not present

## 2017-11-03 DIAGNOSIS — R262 Difficulty in walking, not elsewhere classified: Secondary | ICD-10-CM | POA: Diagnosis not present

## 2017-11-03 DIAGNOSIS — R41841 Cognitive communication deficit: Secondary | ICD-10-CM | POA: Diagnosis not present

## 2017-11-03 DIAGNOSIS — G8102 Flaccid hemiplegia affecting left dominant side: Secondary | ICD-10-CM | POA: Diagnosis not present

## 2017-11-05 DIAGNOSIS — R41841 Cognitive communication deficit: Secondary | ICD-10-CM | POA: Diagnosis not present

## 2017-11-05 DIAGNOSIS — G8102 Flaccid hemiplegia affecting left dominant side: Secondary | ICD-10-CM | POA: Diagnosis not present

## 2017-11-05 DIAGNOSIS — R262 Difficulty in walking, not elsewhere classified: Secondary | ICD-10-CM | POA: Diagnosis not present

## 2017-11-07 ENCOUNTER — Other Ambulatory Visit: Payer: Self-pay | Admitting: Cardiology

## 2017-11-07 DIAGNOSIS — G8102 Flaccid hemiplegia affecting left dominant side: Secondary | ICD-10-CM | POA: Diagnosis not present

## 2017-11-07 DIAGNOSIS — R262 Difficulty in walking, not elsewhere classified: Secondary | ICD-10-CM | POA: Diagnosis not present

## 2017-11-07 DIAGNOSIS — R41841 Cognitive communication deficit: Secondary | ICD-10-CM | POA: Diagnosis not present

## 2017-11-10 DIAGNOSIS — G8102 Flaccid hemiplegia affecting left dominant side: Secondary | ICD-10-CM | POA: Diagnosis not present

## 2017-11-10 DIAGNOSIS — R262 Difficulty in walking, not elsewhere classified: Secondary | ICD-10-CM | POA: Diagnosis not present

## 2017-11-10 DIAGNOSIS — R41841 Cognitive communication deficit: Secondary | ICD-10-CM | POA: Diagnosis not present

## 2017-11-12 DIAGNOSIS — G8102 Flaccid hemiplegia affecting left dominant side: Secondary | ICD-10-CM | POA: Diagnosis not present

## 2017-11-12 DIAGNOSIS — I10 Essential (primary) hypertension: Secondary | ICD-10-CM | POA: Diagnosis not present

## 2017-11-12 DIAGNOSIS — F17210 Nicotine dependence, cigarettes, uncomplicated: Secondary | ICD-10-CM | POA: Diagnosis not present

## 2017-11-12 DIAGNOSIS — Z9889 Other specified postprocedural states: Secondary | ICD-10-CM | POA: Diagnosis not present

## 2017-11-12 DIAGNOSIS — K210 Gastro-esophageal reflux disease with esophagitis: Secondary | ICD-10-CM | POA: Diagnosis not present

## 2017-11-12 DIAGNOSIS — E119 Type 2 diabetes mellitus without complications: Secondary | ICD-10-CM | POA: Diagnosis not present

## 2017-11-12 DIAGNOSIS — E8881 Metabolic syndrome: Secondary | ICD-10-CM | POA: Diagnosis not present

## 2017-11-12 DIAGNOSIS — R278 Other lack of coordination: Secondary | ICD-10-CM | POA: Diagnosis not present

## 2017-11-12 DIAGNOSIS — R41841 Cognitive communication deficit: Secondary | ICD-10-CM | POA: Diagnosis not present

## 2017-11-12 DIAGNOSIS — M6281 Muscle weakness (generalized): Secondary | ICD-10-CM | POA: Diagnosis not present

## 2017-11-12 DIAGNOSIS — E1165 Type 2 diabetes mellitus with hyperglycemia: Secondary | ICD-10-CM | POA: Diagnosis not present

## 2017-11-12 DIAGNOSIS — Z9189 Other specified personal risk factors, not elsewhere classified: Secondary | ICD-10-CM | POA: Diagnosis not present

## 2017-11-12 DIAGNOSIS — Z7409 Other reduced mobility: Secondary | ICD-10-CM | POA: Diagnosis not present

## 2017-11-12 DIAGNOSIS — R262 Difficulty in walking, not elsewhere classified: Secondary | ICD-10-CM | POA: Diagnosis not present

## 2017-11-12 DIAGNOSIS — E782 Mixed hyperlipidemia: Secondary | ICD-10-CM | POA: Diagnosis not present

## 2017-11-14 DIAGNOSIS — R278 Other lack of coordination: Secondary | ICD-10-CM | POA: Diagnosis not present

## 2017-11-14 DIAGNOSIS — Z9889 Other specified postprocedural states: Secondary | ICD-10-CM | POA: Diagnosis not present

## 2017-11-14 DIAGNOSIS — F17210 Nicotine dependence, cigarettes, uncomplicated: Secondary | ICD-10-CM | POA: Diagnosis not present

## 2017-11-14 DIAGNOSIS — R41841 Cognitive communication deficit: Secondary | ICD-10-CM | POA: Diagnosis not present

## 2017-11-14 DIAGNOSIS — E782 Mixed hyperlipidemia: Secondary | ICD-10-CM | POA: Diagnosis not present

## 2017-11-14 DIAGNOSIS — E119 Type 2 diabetes mellitus without complications: Secondary | ICD-10-CM | POA: Diagnosis not present

## 2017-11-14 DIAGNOSIS — I10 Essential (primary) hypertension: Secondary | ICD-10-CM | POA: Diagnosis not present

## 2017-11-14 DIAGNOSIS — E8881 Metabolic syndrome: Secondary | ICD-10-CM | POA: Diagnosis not present

## 2017-11-14 DIAGNOSIS — Z7409 Other reduced mobility: Secondary | ICD-10-CM | POA: Diagnosis not present

## 2017-11-14 DIAGNOSIS — R262 Difficulty in walking, not elsewhere classified: Secondary | ICD-10-CM | POA: Diagnosis not present

## 2017-11-14 DIAGNOSIS — Z6834 Body mass index (BMI) 34.0-34.9, adult: Secondary | ICD-10-CM | POA: Diagnosis not present

## 2017-11-14 DIAGNOSIS — G8102 Flaccid hemiplegia affecting left dominant side: Secondary | ICD-10-CM | POA: Diagnosis not present

## 2017-11-14 DIAGNOSIS — M6281 Muscle weakness (generalized): Secondary | ICD-10-CM | POA: Diagnosis not present

## 2017-11-14 DIAGNOSIS — E6609 Other obesity due to excess calories: Secondary | ICD-10-CM | POA: Diagnosis not present

## 2017-11-17 DIAGNOSIS — G8102 Flaccid hemiplegia affecting left dominant side: Secondary | ICD-10-CM | POA: Diagnosis not present

## 2017-11-17 DIAGNOSIS — Z7409 Other reduced mobility: Secondary | ICD-10-CM | POA: Diagnosis not present

## 2017-11-17 DIAGNOSIS — R41841 Cognitive communication deficit: Secondary | ICD-10-CM | POA: Diagnosis not present

## 2017-11-17 DIAGNOSIS — E119 Type 2 diabetes mellitus without complications: Secondary | ICD-10-CM | POA: Diagnosis not present

## 2017-11-17 DIAGNOSIS — I2510 Atherosclerotic heart disease of native coronary artery without angina pectoris: Secondary | ICD-10-CM | POA: Diagnosis not present

## 2017-11-17 DIAGNOSIS — I255 Ischemic cardiomyopathy: Secondary | ICD-10-CM | POA: Diagnosis not present

## 2017-11-17 DIAGNOSIS — Z9889 Other specified postprocedural states: Secondary | ICD-10-CM | POA: Diagnosis not present

## 2017-11-17 DIAGNOSIS — M6281 Muscle weakness (generalized): Secondary | ICD-10-CM | POA: Diagnosis not present

## 2017-11-17 DIAGNOSIS — R262 Difficulty in walking, not elsewhere classified: Secondary | ICD-10-CM | POA: Diagnosis not present

## 2017-11-17 DIAGNOSIS — I10 Essential (primary) hypertension: Secondary | ICD-10-CM | POA: Diagnosis not present

## 2017-11-17 DIAGNOSIS — I69354 Hemiplegia and hemiparesis following cerebral infarction affecting left non-dominant side: Secondary | ICD-10-CM | POA: Diagnosis not present

## 2017-11-17 DIAGNOSIS — R278 Other lack of coordination: Secondary | ICD-10-CM | POA: Diagnosis not present

## 2017-11-19 DIAGNOSIS — I10 Essential (primary) hypertension: Secondary | ICD-10-CM | POA: Diagnosis not present

## 2017-11-19 DIAGNOSIS — R41841 Cognitive communication deficit: Secondary | ICD-10-CM | POA: Diagnosis not present

## 2017-11-19 DIAGNOSIS — E119 Type 2 diabetes mellitus without complications: Secondary | ICD-10-CM | POA: Diagnosis not present

## 2017-11-19 DIAGNOSIS — R262 Difficulty in walking, not elsewhere classified: Secondary | ICD-10-CM | POA: Diagnosis not present

## 2017-11-19 DIAGNOSIS — R278 Other lack of coordination: Secondary | ICD-10-CM | POA: Diagnosis not present

## 2017-11-19 DIAGNOSIS — Z7409 Other reduced mobility: Secondary | ICD-10-CM | POA: Diagnosis not present

## 2017-11-19 DIAGNOSIS — Z9889 Other specified postprocedural states: Secondary | ICD-10-CM | POA: Diagnosis not present

## 2017-11-19 DIAGNOSIS — G8102 Flaccid hemiplegia affecting left dominant side: Secondary | ICD-10-CM | POA: Diagnosis not present

## 2017-11-19 DIAGNOSIS — M6281 Muscle weakness (generalized): Secondary | ICD-10-CM | POA: Diagnosis not present

## 2017-11-20 ENCOUNTER — Other Ambulatory Visit: Payer: Self-pay | Admitting: Cardiology

## 2017-11-20 DIAGNOSIS — F329 Major depressive disorder, single episode, unspecified: Secondary | ICD-10-CM | POA: Diagnosis not present

## 2017-11-20 DIAGNOSIS — Z7409 Other reduced mobility: Secondary | ICD-10-CM | POA: Diagnosis not present

## 2017-11-20 DIAGNOSIS — G8102 Flaccid hemiplegia affecting left dominant side: Secondary | ICD-10-CM | POA: Diagnosis not present

## 2017-11-20 DIAGNOSIS — R41841 Cognitive communication deficit: Secondary | ICD-10-CM | POA: Diagnosis not present

## 2017-11-20 DIAGNOSIS — R278 Other lack of coordination: Secondary | ICD-10-CM | POA: Diagnosis not present

## 2017-11-20 DIAGNOSIS — M6281 Muscle weakness (generalized): Secondary | ICD-10-CM | POA: Diagnosis not present

## 2017-11-20 DIAGNOSIS — F1120 Opioid dependence, uncomplicated: Secondary | ICD-10-CM | POA: Diagnosis not present

## 2017-11-20 DIAGNOSIS — E119 Type 2 diabetes mellitus without complications: Secondary | ICD-10-CM | POA: Diagnosis not present

## 2017-11-20 DIAGNOSIS — I639 Cerebral infarction, unspecified: Secondary | ICD-10-CM | POA: Diagnosis not present

## 2017-11-20 DIAGNOSIS — I10 Essential (primary) hypertension: Secondary | ICD-10-CM | POA: Diagnosis not present

## 2017-11-20 DIAGNOSIS — R262 Difficulty in walking, not elsewhere classified: Secondary | ICD-10-CM | POA: Diagnosis not present

## 2017-11-20 DIAGNOSIS — Z9889 Other specified postprocedural states: Secondary | ICD-10-CM | POA: Diagnosis not present

## 2017-11-20 DIAGNOSIS — Z79899 Other long term (current) drug therapy: Secondary | ICD-10-CM | POA: Diagnosis not present

## 2017-11-24 DIAGNOSIS — R262 Difficulty in walking, not elsewhere classified: Secondary | ICD-10-CM | POA: Diagnosis not present

## 2017-11-24 DIAGNOSIS — Z9889 Other specified postprocedural states: Secondary | ICD-10-CM | POA: Diagnosis not present

## 2017-11-24 DIAGNOSIS — R278 Other lack of coordination: Secondary | ICD-10-CM | POA: Diagnosis not present

## 2017-11-24 DIAGNOSIS — R41841 Cognitive communication deficit: Secondary | ICD-10-CM | POA: Diagnosis not present

## 2017-11-24 DIAGNOSIS — G8102 Flaccid hemiplegia affecting left dominant side: Secondary | ICD-10-CM | POA: Diagnosis not present

## 2017-11-24 DIAGNOSIS — I10 Essential (primary) hypertension: Secondary | ICD-10-CM | POA: Diagnosis not present

## 2017-11-24 DIAGNOSIS — M6281 Muscle weakness (generalized): Secondary | ICD-10-CM | POA: Diagnosis not present

## 2017-11-24 DIAGNOSIS — Z7409 Other reduced mobility: Secondary | ICD-10-CM | POA: Diagnosis not present

## 2017-11-24 DIAGNOSIS — E119 Type 2 diabetes mellitus without complications: Secondary | ICD-10-CM | POA: Diagnosis not present

## 2017-11-25 ENCOUNTER — Encounter: Payer: Self-pay | Admitting: Physical Medicine & Rehabilitation

## 2017-11-25 ENCOUNTER — Ambulatory Visit (HOSPITAL_BASED_OUTPATIENT_CLINIC_OR_DEPARTMENT_OTHER): Payer: Medicare HMO | Admitting: Physical Medicine & Rehabilitation

## 2017-11-25 ENCOUNTER — Encounter: Payer: Medicare HMO | Attending: Physical Medicine & Rehabilitation

## 2017-11-25 VITALS — BP 155/74 | HR 79 | Resp 14 | Ht 71.0 in | Wt 270.0 lb

## 2017-11-25 DIAGNOSIS — M1990 Unspecified osteoarthritis, unspecified site: Secondary | ICD-10-CM | POA: Diagnosis not present

## 2017-11-25 DIAGNOSIS — I509 Heart failure, unspecified: Secondary | ICD-10-CM | POA: Insufficient documentation

## 2017-11-25 DIAGNOSIS — G4733 Obstructive sleep apnea (adult) (pediatric): Secondary | ICD-10-CM | POA: Insufficient documentation

## 2017-11-25 DIAGNOSIS — I69354 Hemiplegia and hemiparesis following cerebral infarction affecting left non-dominant side: Secondary | ICD-10-CM | POA: Insufficient documentation

## 2017-11-25 DIAGNOSIS — I252 Old myocardial infarction: Secondary | ICD-10-CM | POA: Insufficient documentation

## 2017-11-25 DIAGNOSIS — I639 Cerebral infarction, unspecified: Secondary | ICD-10-CM

## 2017-11-25 DIAGNOSIS — G8110 Spastic hemiplegia affecting unspecified side: Secondary | ICD-10-CM | POA: Diagnosis not present

## 2017-11-25 DIAGNOSIS — G8114 Spastic hemiplegia affecting left nondominant side: Secondary | ICD-10-CM

## 2017-11-25 DIAGNOSIS — F17210 Nicotine dependence, cigarettes, uncomplicated: Secondary | ICD-10-CM | POA: Insufficient documentation

## 2017-11-25 DIAGNOSIS — J449 Chronic obstructive pulmonary disease, unspecified: Secondary | ICD-10-CM | POA: Insufficient documentation

## 2017-11-25 DIAGNOSIS — I2510 Atherosclerotic heart disease of native coronary artery without angina pectoris: Secondary | ICD-10-CM | POA: Diagnosis not present

## 2017-11-25 DIAGNOSIS — E782 Mixed hyperlipidemia: Secondary | ICD-10-CM | POA: Diagnosis not present

## 2017-11-25 DIAGNOSIS — I110 Hypertensive heart disease with heart failure: Secondary | ICD-10-CM | POA: Insufficient documentation

## 2017-11-25 DIAGNOSIS — F419 Anxiety disorder, unspecified: Secondary | ICD-10-CM | POA: Diagnosis not present

## 2017-11-25 DIAGNOSIS — E119 Type 2 diabetes mellitus without complications: Secondary | ICD-10-CM | POA: Diagnosis not present

## 2017-11-25 NOTE — Progress Notes (Signed)
Subjective:    Patient ID: [name], male    DOB: [birth year], 52 y.o.   MRN: [id number] 52 year old male with history of coronary artery disease, myocardial infarction, diabetes mellitus and hypertension who developed sudden onset left-sided weakness 06/14/2017.  He went to his local hospital but then was transferred to [facility].  MRI showed a right basal ganglia infarct.  He did receive TPA.  Doppler showed no significant ICA stenosis.  MRA showed no significant intracranial or extracranial stenosis.  Echocardiogram showed a low ejection fraction of 25-30% no evidence of PFO or ASD.  No atrial clots noted.  Hemoglobin A1c was 5.7.  He was placed on aspirin and Plavix. Patient has pain on the hemiplegic side.  Has been started on gabapentin, neurology consultation on 08/27/2017 with Dr. Tomi Likens.  Baclofen 10 mg 3 times daily started for spasticity on the left side.  AFO was ordered HPI Botox 10/14/2017 FDP 25 FDS 25 FCR 25  Gastroc  50U Med 50 U Lat  Soleus 25 U lat 25U med  Tib post  75 U   Patient feels that the thumb flexor is still tight in the finger flexors as well.  He feels his elbow flexor is tight as well although his elbows fully extended.  His fingers are held in a partially flexed position at rest. He has been told by his therapist that his hamstrings are very tight.  He notes some calf Cramping since the Botox injection.  He has been noticing increased ease of donning and doffing his AFO.  He does not have his AFO with him today. We discussed the swelling in the left lower extremity how this is common with hemiplegia after stroke.  He has no pain or redness.  Pain Inventory Average Pain 8 Pain Right Now 8 My pain is stabbing  In the last 24 hours, has pain interfered with the following? General activity 8 Relation with others 6 Enjoyment of life 9 What TIME of day is your pain at its worst? morning Sleep (in general) Poor  Pain is worse  with: walking, bending and standing Pain improves with: rest and medication Relief from Meds: 5  Mobility walk with assistance use a cane how many minutes can you walk? 2-3 ability to climb steps?  no do you drive?  no use a wheelchair needs help with transfers  Function disabled: date disabled 2003 I need assistance with the following:  dressing, bathing, toileting, meal prep, household duties and shopping  Neuro/Psych numbness tingling trouble walking spasms confusion depression anxiety  Prior Studies Any changes since last visit?  no  Physicians involved in your care Any changes since last visit?  no   Family History  Problem Relation Age of Onset  . Lung cancer Father   . Heart disease Father   . Heart disease Mother   . Congestive Heart Failure Mother   . Diabetes Mother   . Hyperlipidemia Mother   . Breast cancer Maternal Aunt    Social History   Socioeconomic History  . Marital status: Legally Separated    Spouse name: Jolayne Haines  . Number of children: 2  . Years of education: Not on file  . Highest education level: 12th grade  Occupational History  . Occupation: disabled  Social Needs  . Financial resource strain: Not on file  . Food insecurity:    Worry: Not on file    Inability: Not on file  . Transportation needs:    Medical: Not  on file    Non-medical: Not on file  Tobacco Use  . Smoking status: Current Every Day Smoker    Packs/day: 1.50    Years: 30.00    Pack years: 45.00    Types: Cigarettes    Start date: 06/08/1982  . Smokeless tobacco: Never Used  Substance and Sexual Activity  . Alcohol use: No  . Drug use: No  . Sexual activity: Yes    Birth control/protection: None  Lifestyle  . Physical activity:    Days per week: Not on file    Minutes per session: Not on file  . Stress: Not on file  Relationships  . Social connections:    Talks on phone: Not on file    Gets together: Not on file    Attends religious service: Not  on file    Active member of club or organization: Not on file    Attends meetings of clubs or organizations: Not on file    Relationship status: Not on file  Other Topics Concern  . Not on file  Social History Narrative   Disabled since age 51 lives with wife and children      Patient is right-handed. He is married, lives in a 1 story house, has a ramp to enter. Drinks 64oz of Mtn. Dew a day. Prior to CVA was walking daily. 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   Past Surgical History:  Procedure Laterality Date  . CHOLECYSTECTOMY    . CORONARY ARTERY BYPASS GRAFT  2010   LIMA to LAD, SVG to diagonal, SVG to OM1 and OM 2, SVG to RCA  . DENTAL SURGERY  2003  . GASTRIC BYPASS  2010  . HERNIA REPAIR  2011, 2012  . INCISIONAL HERNIA REPAIR N/A 07/23/2013   Procedure: HERNIA REPAIR INCISIONAL WITH MESH;  Surgeon: Jamesetta So, MD;  Location: [facility];  Service: General;  Laterality: N/A;  . INSERTION OF MESH N/A 07/23/2013   Procedure: INSERTION OF MESH;  Surgeon: Jamesetta So, MD;  Location: [facility];  Service: General;  Laterality: N/A;  . LEFT HEART CATHETERIZATION WITH CORONARY/GRAFT ANGIOGRAM N/A 11/01/2013   Procedure: LEFT HEART CATHETERIZATION WITH Beatrix Fetters;  Surgeon: Blane Ohara, MD;  Location: [facility] CATH LAB;  Service: Cardiovascular;  Laterality: N/A;  . TOE AMPUTATION  1998   right 1st and 2nd toe  . TONSILECTOMY, ADENOIDECTOMY, BILATERAL MYRINGOTOMY AND TUBES    . TONSILLECTOMY    . VENTRAL HERNIA REPAIR N/A 10/28/2012   Procedure: LAPAROSCOPIC VENTRAL HERNIA;  Surgeon: Donato Heinz, MD;  Location: [facility];  Service: General;  Laterality: N/A;   Past Medical History:  Diagnosis Date  . Anaphylaxis    IGE mediated  . Anxiety   . Arthritis   . Cardiomyopathy ([place])   . CHF (congestive heart failure) ([place])   . COPD (chronic obstructive pulmonary disease) ([place])   . Coronary atherosclerosis of native coronary artery    Multivessel status  post CABG 2010  . Depression   . Essential hypertension, benign   . History of CVA (cerebrovascular accident) 01/2012   Right posterior frontal cortical and subcortical brain by MRI, no hemorrhage. Carotid Dopplers showed only 1-50% bilateral ICA stenoses. Echocardiogram showed LVEF 50%, no major valvular abnormalities.  . Mixed hyperlipidemia   . Myocardial infarction ([place]) 2010  . OSA (obstructive sleep apnea)   . Stroke ([place])   . Type 2 diabetes mellitus ([facility])    BP (!) 155/74 (  BP Location: Right Arm, Patient Position: Sitting, Cuff Size: Normal)   Pulse 79   Resp 14   Ht 5\' 11"  (1.803 m)   Wt 270 lb (122.5 kg)   SpO2 93%   BMI 37.66 kg/m   Opioid Risk Score:   Fall Risk Score:  `1  Depression screen PHQ 2/9  Depression screen PHQ 2/9 09/29/2017  Decreased Interest 1  Down, Depressed, Hopeless 3  PHQ - 2 Score 4  Altered sleeping 3  Tired, decreased energy 2  Change in appetite 3  Feeling bad or failure about yourself  3  Trouble concentrating 3  Moving slowly or fidgety/restless 2  Suicidal thoughts 0  PHQ-9 Score 20  Difficult doing work/chores Somewhat difficult    Review of Systems  Constitutional: Positive for unexpected weight change.  Eyes: Negative.   Respiratory: Positive for apnea, cough and shortness of breath.   Cardiovascular: Positive for leg swelling.  Gastrointestinal: Positive for constipation.  Endocrine: Negative.   Genitourinary: Negative.   Musculoskeletal: Positive for gait problem and myalgias.       Spasms   Skin: Negative.   Allergic/Immunologic: Negative.   Neurological: Positive for numbness.       Tingling   Hematological: Negative.   Psychiatric/Behavioral: Positive for confusion and dysphoric mood. The patient is nervous/anxious.        Objective:   Physical Exam  Constitutional: He is oriented to person, place, and time. He appears well-developed and well-nourished.  HENT:  Head: Normocephalic and atraumatic.  Eyes:  Pupils are equal, round, and reactive to light. EOM are normal.  Neurological: He is alert and oriented to person, place, and time.  Skin: Skin is warm and dry.  Nursing note and vitals reviewed.  Motor strength is trace in the finger flexors and extensors as well as elbow flexors and extensors.  Trace at the shoulder abductors. Lower extremity has 3- hip knee extensor synergy. Tone MAS 3 at the left hamstrings mainly medial MAS 3 at the ankle plantar flexors MAS 2 at the foot inverters on the left MAS 3 at the left FPL, MAS 2 at the FDS FDP MAS 0 at the elbow flexors.       Assessment & Plan:  #1.  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  Reduced ankle inverter tone as well as reduced finger flexor tone after Botox injection.  We discussed that he received 300 units last visit and that the maximum dose is 400 units.  We discussed altering his muscle dosing to address his hamstring spasticity as well as thumb flexor spasticity. Left upper limb FDP 50 FDS 50 FCR 25 FPL 25  Left lower limb Tib post 50 U  Hamstring 200 medial

## 2017-11-25 NOTE — Patient Instructions (Signed)
Will increase Botox to 400U will inject hamstrings as well as arm

## 2017-11-26 DIAGNOSIS — I255 Ischemic cardiomyopathy: Secondary | ICD-10-CM | POA: Diagnosis not present

## 2017-11-26 DIAGNOSIS — R278 Other lack of coordination: Secondary | ICD-10-CM | POA: Diagnosis not present

## 2017-11-26 DIAGNOSIS — E119 Type 2 diabetes mellitus without complications: Secondary | ICD-10-CM | POA: Diagnosis not present

## 2017-11-26 DIAGNOSIS — I69354 Hemiplegia and hemiparesis following cerebral infarction affecting left non-dominant side: Secondary | ICD-10-CM | POA: Diagnosis not present

## 2017-11-26 DIAGNOSIS — G8102 Flaccid hemiplegia affecting left dominant side: Secondary | ICD-10-CM | POA: Diagnosis not present

## 2017-11-26 DIAGNOSIS — R262 Difficulty in walking, not elsewhere classified: Secondary | ICD-10-CM | POA: Diagnosis not present

## 2017-11-26 DIAGNOSIS — M6281 Muscle weakness (generalized): Secondary | ICD-10-CM | POA: Diagnosis not present

## 2017-11-26 DIAGNOSIS — I2510 Atherosclerotic heart disease of native coronary artery without angina pectoris: Secondary | ICD-10-CM | POA: Diagnosis not present

## 2017-11-26 DIAGNOSIS — Z9889 Other specified postprocedural states: Secondary | ICD-10-CM | POA: Diagnosis not present

## 2017-11-26 DIAGNOSIS — R41841 Cognitive communication deficit: Secondary | ICD-10-CM | POA: Diagnosis not present

## 2017-11-26 DIAGNOSIS — I10 Essential (primary) hypertension: Secondary | ICD-10-CM | POA: Diagnosis not present

## 2017-11-26 DIAGNOSIS — Z7409 Other reduced mobility: Secondary | ICD-10-CM | POA: Diagnosis not present

## 2017-11-27 DIAGNOSIS — R278 Other lack of coordination: Secondary | ICD-10-CM | POA: Diagnosis not present

## 2017-11-27 DIAGNOSIS — I255 Ischemic cardiomyopathy: Secondary | ICD-10-CM | POA: Diagnosis not present

## 2017-11-27 DIAGNOSIS — I2510 Atherosclerotic heart disease of native coronary artery without angina pectoris: Secondary | ICD-10-CM | POA: Diagnosis not present

## 2017-11-27 DIAGNOSIS — Z9889 Other specified postprocedural states: Secondary | ICD-10-CM | POA: Diagnosis not present

## 2017-11-27 DIAGNOSIS — G8102 Flaccid hemiplegia affecting left dominant side: Secondary | ICD-10-CM | POA: Diagnosis not present

## 2017-11-27 DIAGNOSIS — I69354 Hemiplegia and hemiparesis following cerebral infarction affecting left non-dominant side: Secondary | ICD-10-CM | POA: Diagnosis not present

## 2017-11-27 DIAGNOSIS — E119 Type 2 diabetes mellitus without complications: Secondary | ICD-10-CM | POA: Diagnosis not present

## 2017-11-27 DIAGNOSIS — I10 Essential (primary) hypertension: Secondary | ICD-10-CM | POA: Diagnosis not present

## 2017-11-27 DIAGNOSIS — R41841 Cognitive communication deficit: Secondary | ICD-10-CM | POA: Diagnosis not present

## 2017-11-27 DIAGNOSIS — R262 Difficulty in walking, not elsewhere classified: Secondary | ICD-10-CM | POA: Diagnosis not present

## 2017-11-27 DIAGNOSIS — M6281 Muscle weakness (generalized): Secondary | ICD-10-CM | POA: Diagnosis not present

## 2017-11-27 DIAGNOSIS — Z7409 Other reduced mobility: Secondary | ICD-10-CM | POA: Diagnosis not present

## 2017-11-28 DIAGNOSIS — E119 Type 2 diabetes mellitus without complications: Secondary | ICD-10-CM | POA: Diagnosis not present

## 2017-11-28 DIAGNOSIS — I255 Ischemic cardiomyopathy: Secondary | ICD-10-CM | POA: Diagnosis not present

## 2017-11-28 DIAGNOSIS — I69354 Hemiplegia and hemiparesis following cerebral infarction affecting left non-dominant side: Secondary | ICD-10-CM | POA: Diagnosis not present

## 2017-11-28 DIAGNOSIS — I2510 Atherosclerotic heart disease of native coronary artery without angina pectoris: Secondary | ICD-10-CM | POA: Diagnosis not present

## 2017-12-01 DIAGNOSIS — I255 Ischemic cardiomyopathy: Secondary | ICD-10-CM | POA: Diagnosis not present

## 2017-12-01 DIAGNOSIS — I2510 Atherosclerotic heart disease of native coronary artery without angina pectoris: Secondary | ICD-10-CM | POA: Diagnosis not present

## 2017-12-01 DIAGNOSIS — Z9889 Other specified postprocedural states: Secondary | ICD-10-CM | POA: Diagnosis not present

## 2017-12-01 DIAGNOSIS — R262 Difficulty in walking, not elsewhere classified: Secondary | ICD-10-CM | POA: Diagnosis not present

## 2017-12-01 DIAGNOSIS — G8102 Flaccid hemiplegia affecting left dominant side: Secondary | ICD-10-CM | POA: Diagnosis not present

## 2017-12-01 DIAGNOSIS — R05 Cough: Secondary | ICD-10-CM | POA: Diagnosis not present

## 2017-12-01 DIAGNOSIS — J40 Bronchitis, not specified as acute or chronic: Secondary | ICD-10-CM | POA: Diagnosis not present

## 2017-12-01 DIAGNOSIS — R41841 Cognitive communication deficit: Secondary | ICD-10-CM | POA: Diagnosis not present

## 2017-12-01 DIAGNOSIS — M6281 Muscle weakness (generalized): Secondary | ICD-10-CM | POA: Diagnosis not present

## 2017-12-01 DIAGNOSIS — I10 Essential (primary) hypertension: Secondary | ICD-10-CM | POA: Diagnosis not present

## 2017-12-01 DIAGNOSIS — E119 Type 2 diabetes mellitus without complications: Secondary | ICD-10-CM | POA: Diagnosis not present

## 2017-12-01 DIAGNOSIS — I69354 Hemiplegia and hemiparesis following cerebral infarction affecting left non-dominant side: Secondary | ICD-10-CM | POA: Diagnosis not present

## 2017-12-01 DIAGNOSIS — R278 Other lack of coordination: Secondary | ICD-10-CM | POA: Diagnosis not present

## 2017-12-01 DIAGNOSIS — Z7409 Other reduced mobility: Secondary | ICD-10-CM | POA: Diagnosis not present

## 2017-12-02 ENCOUNTER — Telehealth: Payer: Self-pay | Admitting: Cardiology

## 2017-12-02 DIAGNOSIS — I69354 Hemiplegia and hemiparesis following cerebral infarction affecting left non-dominant side: Secondary | ICD-10-CM | POA: Diagnosis not present

## 2017-12-02 DIAGNOSIS — I2510 Atherosclerotic heart disease of native coronary artery without angina pectoris: Secondary | ICD-10-CM | POA: Diagnosis not present

## 2017-12-02 DIAGNOSIS — I69398 Other sequelae of cerebral infarction: Secondary | ICD-10-CM | POA: Diagnosis not present

## 2017-12-02 DIAGNOSIS — E119 Type 2 diabetes mellitus without complications: Secondary | ICD-10-CM | POA: Diagnosis not present

## 2017-12-02 DIAGNOSIS — I255 Ischemic cardiomyopathy: Secondary | ICD-10-CM | POA: Diagnosis not present

## 2017-12-02 MED ORDER — SACUBITRIL-VALSARTAN 49-51 MG PO TABS: ORAL_TABLET | ORAL | 3 refills | Status: DC

## 2017-12-02 NOTE — Telephone Encounter (Signed)
RX sent

## 2017-12-02 NOTE — Telephone Encounter (Signed)
[place] told patient that they have not received a refill for his Entresto  Please call patient

## 2017-12-03 DIAGNOSIS — Z7409 Other reduced mobility: Secondary | ICD-10-CM | POA: Diagnosis not present

## 2017-12-03 DIAGNOSIS — M6281 Muscle weakness (generalized): Secondary | ICD-10-CM | POA: Diagnosis not present

## 2017-12-03 DIAGNOSIS — I10 Essential (primary) hypertension: Secondary | ICD-10-CM | POA: Diagnosis not present

## 2017-12-03 DIAGNOSIS — I639 Cerebral infarction, unspecified: Secondary | ICD-10-CM | POA: Diagnosis not present

## 2017-12-03 DIAGNOSIS — G8102 Flaccid hemiplegia affecting left dominant side: Secondary | ICD-10-CM | POA: Diagnosis not present

## 2017-12-03 DIAGNOSIS — Z9889 Other specified postprocedural states: Secondary | ICD-10-CM | POA: Diagnosis not present

## 2017-12-03 DIAGNOSIS — E119 Type 2 diabetes mellitus without complications: Secondary | ICD-10-CM | POA: Diagnosis not present

## 2017-12-03 DIAGNOSIS — I2510 Atherosclerotic heart disease of native coronary artery without angina pectoris: Secondary | ICD-10-CM | POA: Diagnosis not present

## 2017-12-03 DIAGNOSIS — R41841 Cognitive communication deficit: Secondary | ICD-10-CM | POA: Diagnosis not present

## 2017-12-03 DIAGNOSIS — Z79899 Other long term (current) drug therapy: Secondary | ICD-10-CM | POA: Diagnosis not present

## 2017-12-03 DIAGNOSIS — I255 Ischemic cardiomyopathy: Secondary | ICD-10-CM | POA: Diagnosis not present

## 2017-12-03 DIAGNOSIS — I69354 Hemiplegia and hemiparesis following cerebral infarction affecting left non-dominant side: Secondary | ICD-10-CM | POA: Diagnosis not present

## 2017-12-03 DIAGNOSIS — F1120 Opioid dependence, uncomplicated: Secondary | ICD-10-CM | POA: Diagnosis not present

## 2017-12-03 DIAGNOSIS — R262 Difficulty in walking, not elsewhere classified: Secondary | ICD-10-CM | POA: Diagnosis not present

## 2017-12-03 DIAGNOSIS — R278 Other lack of coordination: Secondary | ICD-10-CM | POA: Diagnosis not present

## 2017-12-04 DIAGNOSIS — I2510 Atherosclerotic heart disease of native coronary artery without angina pectoris: Secondary | ICD-10-CM | POA: Diagnosis not present

## 2017-12-04 DIAGNOSIS — R262 Difficulty in walking, not elsewhere classified: Secondary | ICD-10-CM | POA: Diagnosis not present

## 2017-12-04 DIAGNOSIS — I255 Ischemic cardiomyopathy: Secondary | ICD-10-CM | POA: Diagnosis not present

## 2017-12-04 DIAGNOSIS — G8102 Flaccid hemiplegia affecting left dominant side: Secondary | ICD-10-CM | POA: Diagnosis not present

## 2017-12-04 DIAGNOSIS — Z7409 Other reduced mobility: Secondary | ICD-10-CM | POA: Diagnosis not present

## 2017-12-04 DIAGNOSIS — E119 Type 2 diabetes mellitus without complications: Secondary | ICD-10-CM | POA: Diagnosis not present

## 2017-12-04 DIAGNOSIS — M6281 Muscle weakness (generalized): Secondary | ICD-10-CM | POA: Diagnosis not present

## 2017-12-04 DIAGNOSIS — I10 Essential (primary) hypertension: Secondary | ICD-10-CM | POA: Diagnosis not present

## 2017-12-04 DIAGNOSIS — I69354 Hemiplegia and hemiparesis following cerebral infarction affecting left non-dominant side: Secondary | ICD-10-CM | POA: Diagnosis not present

## 2017-12-04 DIAGNOSIS — Z9889 Other specified postprocedural states: Secondary | ICD-10-CM | POA: Diagnosis not present

## 2017-12-04 DIAGNOSIS — R41841 Cognitive communication deficit: Secondary | ICD-10-CM | POA: Diagnosis not present

## 2017-12-04 DIAGNOSIS — R278 Other lack of coordination: Secondary | ICD-10-CM | POA: Diagnosis not present

## 2017-12-05 DIAGNOSIS — I2510 Atherosclerotic heart disease of native coronary artery without angina pectoris: Secondary | ICD-10-CM | POA: Diagnosis not present

## 2017-12-05 DIAGNOSIS — I255 Ischemic cardiomyopathy: Secondary | ICD-10-CM | POA: Diagnosis not present

## 2017-12-05 DIAGNOSIS — I69354 Hemiplegia and hemiparesis following cerebral infarction affecting left non-dominant side: Secondary | ICD-10-CM | POA: Diagnosis not present

## 2017-12-05 DIAGNOSIS — E119 Type 2 diabetes mellitus without complications: Secondary | ICD-10-CM | POA: Diagnosis not present

## 2017-12-08 DIAGNOSIS — I69354 Hemiplegia and hemiparesis following cerebral infarction affecting left non-dominant side: Secondary | ICD-10-CM | POA: Diagnosis not present

## 2017-12-08 DIAGNOSIS — I255 Ischemic cardiomyopathy: Secondary | ICD-10-CM | POA: Diagnosis not present

## 2017-12-08 DIAGNOSIS — E119 Type 2 diabetes mellitus without complications: Secondary | ICD-10-CM | POA: Diagnosis not present

## 2017-12-08 DIAGNOSIS — I2510 Atherosclerotic heart disease of native coronary artery without angina pectoris: Secondary | ICD-10-CM | POA: Diagnosis not present

## 2017-12-09 ENCOUNTER — Ambulatory Visit: Payer: Medicare Other | Admitting: Neurology

## 2017-12-09 DIAGNOSIS — Z9889 Other specified postprocedural states: Secondary | ICD-10-CM | POA: Diagnosis not present

## 2017-12-09 DIAGNOSIS — Z7409 Other reduced mobility: Secondary | ICD-10-CM | POA: Diagnosis not present

## 2017-12-09 DIAGNOSIS — I69354 Hemiplegia and hemiparesis following cerebral infarction affecting left non-dominant side: Secondary | ICD-10-CM | POA: Diagnosis not present

## 2017-12-09 DIAGNOSIS — I255 Ischemic cardiomyopathy: Secondary | ICD-10-CM | POA: Diagnosis not present

## 2017-12-09 DIAGNOSIS — G8102 Flaccid hemiplegia affecting left dominant side: Secondary | ICD-10-CM | POA: Diagnosis not present

## 2017-12-09 DIAGNOSIS — I10 Essential (primary) hypertension: Secondary | ICD-10-CM | POA: Diagnosis not present

## 2017-12-09 DIAGNOSIS — M6281 Muscle weakness (generalized): Secondary | ICD-10-CM | POA: Diagnosis not present

## 2017-12-09 DIAGNOSIS — R262 Difficulty in walking, not elsewhere classified: Secondary | ICD-10-CM | POA: Diagnosis not present

## 2017-12-09 DIAGNOSIS — I2510 Atherosclerotic heart disease of native coronary artery without angina pectoris: Secondary | ICD-10-CM | POA: Diagnosis not present

## 2017-12-09 DIAGNOSIS — R278 Other lack of coordination: Secondary | ICD-10-CM | POA: Diagnosis not present

## 2017-12-09 DIAGNOSIS — E119 Type 2 diabetes mellitus without complications: Secondary | ICD-10-CM | POA: Diagnosis not present

## 2017-12-09 DIAGNOSIS — R41841 Cognitive communication deficit: Secondary | ICD-10-CM | POA: Diagnosis not present

## 2017-12-09 NOTE — Progress Notes (Signed)
Cardiology Office Note  Date: 12/12/2017   ID: [name], DOB 12/07/1965, MRN [id number]  PCP: Caryl Bis, MD  Primary Cardiologist: Rozann Lesches, MD   Chief Complaint  Patient presents with  . Cardiomyopathy    History of Present Illness: [name] is a medically complex 52 y.o. male last seen in April.  He is here today with his wife for a follow-up visit.  Still working with PT to try to improve his left-sided weakness.  The brace on his left foot.  States that he is able to walk for short distances.  He is in a wheelchair today, indicates that he has felt tired.  He does not report any increasing breathlessness however, no chest pain or palpitations.  I reviewed his medications.  He reports tolerating his current cardiac medical regimen and blood pressure has actually been very well controlled.  He had interval lab work with Dr. Quillian Quince which we are requesting for review.  Echocardiogram in April showed LVEF approximately 40% with anteroseptal akinesis.  Past Medical History:  Diagnosis Date  . Anaphylaxis    IGE mediated  . Anxiety   . Arthritis   . Cardiomyopathy ([place])   . CHF (congestive heart failure) ([place])   . COPD (chronic obstructive pulmonary disease) ([place])   . Coronary atherosclerosis of native coronary artery    Multivessel status post CABG 2010  . Depression   . Essential hypertension, benign   . History of CVA (cerebrovascular accident) 01/2012   Right posterior frontal cortical and subcortical brain by MRI, no hemorrhage. Carotid Dopplers showed only 1-50% bilateral ICA stenoses. Echocardiogram showed LVEF 50%, no major valvular abnormalities.  . Mixed hyperlipidemia   . Myocardial infarction ([place]) 2010  . OSA (obstructive sleep apnea)   . Stroke ([place])   . Type 2 diabetes mellitus ([place])     Past Surgical History:  Procedure Laterality Date  . CHOLECYSTECTOMY    . CORONARY ARTERY BYPASS GRAFT  2010   LIMA to LAD, SVG to  diagonal, SVG to OM1 and OM 2, SVG to RCA  . DENTAL SURGERY  2003  . GASTRIC BYPASS  2010  . HERNIA REPAIR  2011, 2012  . INCISIONAL HERNIA REPAIR N/A 07/23/2013   Procedure: HERNIA REPAIR INCISIONAL WITH MESH;  Surgeon: Jamesetta So, MD;  Location: [facility];  Service: General;  Laterality: N/A;  . INSERTION OF MESH N/A 07/23/2013   Procedure: INSERTION OF MESH;  Surgeon: Jamesetta So, MD;  Location: [facility];  Service: General;  Laterality: N/A;  . LEFT HEART CATHETERIZATION WITH CORONARY/GRAFT ANGIOGRAM N/A 11/01/2013   Procedure: LEFT HEART CATHETERIZATION WITH Beatrix Fetters;  Surgeon: Blane Ohara, MD;  Location: [facility] CATH LAB;  Service: Cardiovascular;  Laterality: N/A;  . TOE AMPUTATION  1998   right 1st and 2nd toe  . TONSILECTOMY, ADENOIDECTOMY, BILATERAL MYRINGOTOMY AND TUBES    . TONSILLECTOMY    . VENTRAL HERNIA REPAIR N/A 10/28/2012   Procedure: LAPAROSCOPIC VENTRAL HERNIA;  Surgeon: Donato Heinz, MD;  Location: [facility];  Service: General;  Laterality: N/A;    Current Outpatient Medications  Medication Sig Dispense Refill  . albuterol (PROVENTIL HFA;VENTOLIN HFA) 108 (90 BASE) MCG/ACT inhaler Inhale 2 puffs into the lungs every 6 (six) hours as needed for wheezing or shortness of breath.    . ALPRAZolam (XANAX) 1 MG tablet Take 1 mg by mouth 2 (two) times daily.     [name] aspirin EC 325 MG tablet  Take 325 mg by mouth daily.    [name] atorvastatin (LIPITOR) 40 MG tablet Take 1 tablet by mouth daily.    . baclofen (LIORESAL) 10 MG tablet Take 1 tablet (10 mg total) by mouth 3 (three) times daily as needed for muscle spasms. 90 each 1  . carvedilol (COREG) 6.25 MG tablet Take 6.25 mg by mouth 2 (two) times daily with a meal.    . Cholecalciferol (VITAMIN D) 2000 units tablet Take 2,000 Units by mouth daily at 12 noon.    . clopidogrel (PLAVIX) 75 MG tablet Take 1 tablet by mouth daily.    . DULoxetine (CYMBALTA) 60 MG capsule Take 60 mg by mouth daily.    . fenofibrate  micronized (LOFIBRA) 134 MG capsule Take 134 mg by mouth daily before breakfast.     . fluticasone (CUTIVATE) 0.05 % cream Apply 1 application topically 2 (two) times daily.    . furosemide (LASIX) 20 MG tablet Take 20 mg by mouth.    . gabapentin (NEURONTIN) 600 MG tablet Take 1 tablet by mouth 3 (three) times daily.    . isosorbide mononitrate (IMDUR) 60 MG 24 hr tablet Take 60 mg by mouth 2 (two) times daily.     . nitroGLYCERIN (NITROSTAT) 0.4 MG SL tablet Place 0.4 mg under the tongue every 5 (five) minutes as needed. For chest pain    . oxyCODONE (OXY IR/ROXICODONE) 5 MG immediate release tablet Take 5 mg by mouth every 8 (eight) hours as needed for severe pain.    . pantoprazole (PROTONIX) 40 MG tablet Take 40 mg by mouth daily.    [name] PARoxetine (PAXIL) 20 MG tablet Take 40 mg by mouth daily.     . phentermine 37.5 MG capsule Take 1 capsule by mouth daily.    . sacubitril-valsartan (ENTRESTO) 49-51 MG TAKE (1) TABLET TWICE DAILY. 60 tablet 3  . vitamin B-12 (CYANOCOBALAMIN) 1000 MCG tablet Take 1,000 mcg by mouth daily at 12 noon.     No current facility-administered medications for this visit.    Allergies:  Contrast media [iodinated diagnostic agents]; Ibuprofen; and Nsaids   Social History: The patient  reports that he has been smoking cigarettes.  He started smoking about 35 years ago. He has a 45.00 pack-year smoking history. He has never used smokeless tobacco. He reports that he does not drink alcohol or use drugs.   ROS:  Please see the history of present illness. Otherwise, complete review of systems is positive for left arm paresis, intermittent left leg swelling.  All other systems are reviewed and negative.   Physical Exam: VS:  BP 110/70   Pulse 82   Ht 5\' 11"  (1.803 m)   Wt 269 lb (122 kg)   SpO2 93% Comment: on room air  BMI 37.52 kg/m , BMI Body mass index is 37.52 kg/m.  Wt Readings from Last 3 Encounters:  12/12/17 269 lb (122 kg)  11/25/17 270 lb (122.5 kg)    10/31/17 270 lb (122.5 kg)    General: Overweight male in wheelchair. HEENT: Conjunctiva and lids normal, oropharynx clear. Neck: Supple, no elevated JVP or carotid bruits, no thyromegaly. Lungs: Clear to auscultation, nonlabored breathing at rest. Cardiac: Regular rate and rhythm, no S3 or significant systolic murmur, no pericardial rub. Abdomen: Soft, nontender, bowel sounds present. Extremities: Mild lower leg edema, left greater than right but stable, brace on left leg, distal pulses 2+. Skin: Warm and dry. Musculoskeletal: No kyphosis. Neuropsychiatric: Alert and oriented x3, affect grossly  appropriate.  ECG: I personally reviewed the tracing from 07/28/2017 which showed sinus rhythm with vertical axis, anterolateral infarct pattern, nonspecific ST changes.  Recent Labwork: 07/28/2017: B Natriuretic Peptide 138.0; BUN 22; Creatinine, Ser 1.09; Hemoglobin 14.9; Platelets 254; Potassium 3.3; Sodium 139     Component Value Date/Time   CHOL 156 11/01/2013 0710   TRIG 185 (H) 11/01/2013 0710   HDL 27 (L) 11/01/2013 0710   CHOLHDL 5.8 11/01/2013 0710   VLDL 37 11/01/2013 0710   LDLCALC 92 11/01/2013 0710    Other Studies Reviewed Today:  Lexiscan Myoview 06/18/2017([facility]): FINDINGS: Large fixed defect is present involving the apex and large portion of the periapical anterior wall. No evidence of ischemia. Wall motion analysis reveals apical dyskinesis and akinesis. Left ventricular ejection fraction is calculated to be  18%.  IMPRESSION: Large infarct involving the apex and periapical anterior wall. No evidence of ischemia. Apical akinesis and dyskinesis with ejection fraction of 18%.  Echocardiogram 10/30/2017: Study Conclusions  - Left ventricle: The cavity size was mildly dilated. Wall thickness was increased in a pattern of mild LVH. The estimated ejection fraction was approximately 40% based on limited images. Indeterminate diastolic function. There is akinesis  of the anteroseptal myocardium. - Aortic valve: Mildly calcified annulus. Trileaflet. There was mild regurgitation. - Mitral valve: Mildly calcified annulus. There was trivial regurgitation. - Right ventricle: Poorly visualized. - Atrial septum: No defect or patent foramen ovale was identified. - Tricuspid valve: There was trivial regurgitation. - Pulmonary arteries: Systolic pressure could not be accurately estimated. - Pericardium, extracardiac: There was no pericardial effusion.  Assessment and Plan:  1.  Ischemic cardiomyopathy with LVEF approximately 40% on current medical regimen.  He is tolerating Entresto well.  I am requesting his interval lab work from Dr. Quillian Quince.  Weight is been stable.  2.  Multivessel CAD status post CABG with patent bypass grafts as of 2015 and recent Myoview at [facility] indicating large anterior and periapical scar but no ischemia.  He does not report any active angina.  3.  Status post stroke with left-sided hemiparesis.  He continues to work with PT and rehabilitation.  4.  Essential hypertension, blood pressure has trended much better on Entresto.  5.  Mixed hyperlipidemia.  He continues on Lipitor.  Requesting interval lab work from Dr. Quillian Quince.  Current medicines were reviewed with the patient today.  Disposition: Follow-up in 3 months in the [place] office.  Signed, Satira Sark, MD, [facility] 12/12/2017 1:49 PM    [place] [facility] [address] [place], [place] [address] Phone: [phone]; Fax: [phone]

## 2017-12-10 DIAGNOSIS — E119 Type 2 diabetes mellitus without complications: Secondary | ICD-10-CM | POA: Diagnosis not present

## 2017-12-10 DIAGNOSIS — G8102 Flaccid hemiplegia affecting left dominant side: Secondary | ICD-10-CM | POA: Diagnosis not present

## 2017-12-10 DIAGNOSIS — M6281 Muscle weakness (generalized): Secondary | ICD-10-CM | POA: Diagnosis not present

## 2017-12-10 DIAGNOSIS — R41841 Cognitive communication deficit: Secondary | ICD-10-CM | POA: Diagnosis not present

## 2017-12-10 DIAGNOSIS — Z7409 Other reduced mobility: Secondary | ICD-10-CM | POA: Diagnosis not present

## 2017-12-10 DIAGNOSIS — I10 Essential (primary) hypertension: Secondary | ICD-10-CM | POA: Diagnosis not present

## 2017-12-10 DIAGNOSIS — R278 Other lack of coordination: Secondary | ICD-10-CM | POA: Diagnosis not present

## 2017-12-10 DIAGNOSIS — Z9889 Other specified postprocedural states: Secondary | ICD-10-CM | POA: Diagnosis not present

## 2017-12-10 DIAGNOSIS — R262 Difficulty in walking, not elsewhere classified: Secondary | ICD-10-CM | POA: Diagnosis not present

## 2017-12-10 DIAGNOSIS — I69354 Hemiplegia and hemiparesis following cerebral infarction affecting left non-dominant side: Secondary | ICD-10-CM | POA: Diagnosis not present

## 2017-12-10 DIAGNOSIS — I255 Ischemic cardiomyopathy: Secondary | ICD-10-CM | POA: Diagnosis not present

## 2017-12-10 DIAGNOSIS — I2510 Atherosclerotic heart disease of native coronary artery without angina pectoris: Secondary | ICD-10-CM | POA: Diagnosis not present

## 2017-12-11 DIAGNOSIS — I255 Ischemic cardiomyopathy: Secondary | ICD-10-CM | POA: Diagnosis not present

## 2017-12-11 DIAGNOSIS — E119 Type 2 diabetes mellitus without complications: Secondary | ICD-10-CM | POA: Diagnosis not present

## 2017-12-11 DIAGNOSIS — I2510 Atherosclerotic heart disease of native coronary artery without angina pectoris: Secondary | ICD-10-CM | POA: Diagnosis not present

## 2017-12-11 DIAGNOSIS — I69354 Hemiplegia and hemiparesis following cerebral infarction affecting left non-dominant side: Secondary | ICD-10-CM | POA: Diagnosis not present

## 2017-12-12 ENCOUNTER — Ambulatory Visit (INDEPENDENT_AMBULATORY_CARE_PROVIDER_SITE_OTHER): Payer: Medicare HMO | Admitting: Cardiology

## 2017-12-12 ENCOUNTER — Encounter: Payer: Self-pay | Admitting: Cardiology

## 2017-12-12 VITALS — BP 110/70 | HR 82 | Ht 71.0 in | Wt 269.0 lb

## 2017-12-12 DIAGNOSIS — I2510 Atherosclerotic heart disease of native coronary artery without angina pectoris: Secondary | ICD-10-CM | POA: Diagnosis not present

## 2017-12-12 DIAGNOSIS — I255 Ischemic cardiomyopathy: Secondary | ICD-10-CM

## 2017-12-12 DIAGNOSIS — E782 Mixed hyperlipidemia: Secondary | ICD-10-CM

## 2017-12-12 DIAGNOSIS — I10 Essential (primary) hypertension: Secondary | ICD-10-CM

## 2017-12-12 DIAGNOSIS — M6281 Muscle weakness (generalized): Secondary | ICD-10-CM | POA: Diagnosis not present

## 2017-12-12 DIAGNOSIS — E119 Type 2 diabetes mellitus without complications: Secondary | ICD-10-CM | POA: Diagnosis not present

## 2017-12-12 DIAGNOSIS — R278 Other lack of coordination: Secondary | ICD-10-CM | POA: Diagnosis not present

## 2017-12-12 DIAGNOSIS — R41841 Cognitive communication deficit: Secondary | ICD-10-CM | POA: Diagnosis not present

## 2017-12-12 DIAGNOSIS — I69354 Hemiplegia and hemiparesis following cerebral infarction affecting left non-dominant side: Secondary | ICD-10-CM | POA: Diagnosis not present

## 2017-12-12 DIAGNOSIS — G8102 Flaccid hemiplegia affecting left dominant side: Secondary | ICD-10-CM | POA: Diagnosis not present

## 2017-12-12 DIAGNOSIS — R262 Difficulty in walking, not elsewhere classified: Secondary | ICD-10-CM | POA: Diagnosis not present

## 2017-12-12 DIAGNOSIS — Z9889 Other specified postprocedural states: Secondary | ICD-10-CM | POA: Diagnosis not present

## 2017-12-12 DIAGNOSIS — I25119 Atherosclerotic heart disease of native coronary artery with unspecified angina pectoris: Secondary | ICD-10-CM | POA: Diagnosis not present

## 2017-12-12 DIAGNOSIS — Z7409 Other reduced mobility: Secondary | ICD-10-CM | POA: Diagnosis not present

## 2017-12-12 NOTE — Patient Instructions (Signed)
Medication Instructions:  Your physician recommends that you continue on your current medications as directed. Please refer to the Current Medication list given to you today.   Labwork: I WILL REQUEST LABS FROM DR. DANIEL  Testing/Procedures: NONE  Follow-Up: Your physician recommends that you schedule a follow-up appointment in: 3 MONTHS    Any Other Special Instructions Will Be Listed Below (If Applicable).     If you need a refill on your cardiac medications before your next appointment, please call your pharmacy.

## 2017-12-15 DIAGNOSIS — R262 Difficulty in walking, not elsewhere classified: Secondary | ICD-10-CM | POA: Diagnosis not present

## 2017-12-15 DIAGNOSIS — I2510 Atherosclerotic heart disease of native coronary artery without angina pectoris: Secondary | ICD-10-CM | POA: Diagnosis not present

## 2017-12-15 DIAGNOSIS — Z7409 Other reduced mobility: Secondary | ICD-10-CM | POA: Diagnosis not present

## 2017-12-15 DIAGNOSIS — M6281 Muscle weakness (generalized): Secondary | ICD-10-CM | POA: Diagnosis not present

## 2017-12-15 DIAGNOSIS — R278 Other lack of coordination: Secondary | ICD-10-CM | POA: Diagnosis not present

## 2017-12-15 DIAGNOSIS — I509 Heart failure, unspecified: Secondary | ICD-10-CM | POA: Diagnosis not present

## 2017-12-15 DIAGNOSIS — I110 Hypertensive heart disease with heart failure: Secondary | ICD-10-CM | POA: Diagnosis not present

## 2017-12-15 DIAGNOSIS — I255 Ischemic cardiomyopathy: Secondary | ICD-10-CM | POA: Diagnosis not present

## 2017-12-15 DIAGNOSIS — I69354 Hemiplegia and hemiparesis following cerebral infarction affecting left non-dominant side: Secondary | ICD-10-CM | POA: Diagnosis not present

## 2017-12-15 DIAGNOSIS — Z89421 Acquired absence of other right toe(s): Secondary | ICD-10-CM | POA: Diagnosis not present

## 2017-12-15 DIAGNOSIS — G8102 Flaccid hemiplegia affecting left dominant side: Secondary | ICD-10-CM | POA: Diagnosis not present

## 2017-12-15 DIAGNOSIS — E119 Type 2 diabetes mellitus without complications: Secondary | ICD-10-CM | POA: Diagnosis not present

## 2017-12-16 DIAGNOSIS — I255 Ischemic cardiomyopathy: Secondary | ICD-10-CM | POA: Diagnosis not present

## 2017-12-16 DIAGNOSIS — E119 Type 2 diabetes mellitus without complications: Secondary | ICD-10-CM | POA: Diagnosis not present

## 2017-12-16 DIAGNOSIS — I69354 Hemiplegia and hemiparesis following cerebral infarction affecting left non-dominant side: Secondary | ICD-10-CM | POA: Diagnosis not present

## 2017-12-16 DIAGNOSIS — I2510 Atherosclerotic heart disease of native coronary artery without angina pectoris: Secondary | ICD-10-CM | POA: Diagnosis not present

## 2017-12-17 DIAGNOSIS — E119 Type 2 diabetes mellitus without complications: Secondary | ICD-10-CM | POA: Diagnosis not present

## 2017-12-17 DIAGNOSIS — I255 Ischemic cardiomyopathy: Secondary | ICD-10-CM | POA: Diagnosis not present

## 2017-12-17 DIAGNOSIS — I110 Hypertensive heart disease with heart failure: Secondary | ICD-10-CM | POA: Diagnosis not present

## 2017-12-17 DIAGNOSIS — I2510 Atherosclerotic heart disease of native coronary artery without angina pectoris: Secondary | ICD-10-CM | POA: Diagnosis not present

## 2017-12-17 DIAGNOSIS — Z89421 Acquired absence of other right toe(s): Secondary | ICD-10-CM | POA: Diagnosis not present

## 2017-12-17 DIAGNOSIS — I69354 Hemiplegia and hemiparesis following cerebral infarction affecting left non-dominant side: Secondary | ICD-10-CM | POA: Diagnosis not present

## 2017-12-17 DIAGNOSIS — G8102 Flaccid hemiplegia affecting left dominant side: Secondary | ICD-10-CM | POA: Diagnosis not present

## 2017-12-17 DIAGNOSIS — Z7409 Other reduced mobility: Secondary | ICD-10-CM | POA: Diagnosis not present

## 2017-12-17 DIAGNOSIS — R262 Difficulty in walking, not elsewhere classified: Secondary | ICD-10-CM | POA: Diagnosis not present

## 2017-12-17 DIAGNOSIS — J449 Chronic obstructive pulmonary disease, unspecified: Secondary | ICD-10-CM | POA: Diagnosis not present

## 2017-12-17 DIAGNOSIS — G4733 Obstructive sleep apnea (adult) (pediatric): Secondary | ICD-10-CM | POA: Diagnosis not present

## 2017-12-17 DIAGNOSIS — M6281 Muscle weakness (generalized): Secondary | ICD-10-CM | POA: Diagnosis not present

## 2017-12-17 DIAGNOSIS — I509 Heart failure, unspecified: Secondary | ICD-10-CM | POA: Diagnosis not present

## 2017-12-17 DIAGNOSIS — R278 Other lack of coordination: Secondary | ICD-10-CM | POA: Diagnosis not present

## 2017-12-18 DIAGNOSIS — Z89421 Acquired absence of other right toe(s): Secondary | ICD-10-CM | POA: Diagnosis not present

## 2017-12-18 DIAGNOSIS — I509 Heart failure, unspecified: Secondary | ICD-10-CM | POA: Diagnosis not present

## 2017-12-18 DIAGNOSIS — I110 Hypertensive heart disease with heart failure: Secondary | ICD-10-CM | POA: Diagnosis not present

## 2017-12-18 DIAGNOSIS — Z7409 Other reduced mobility: Secondary | ICD-10-CM | POA: Diagnosis not present

## 2017-12-18 DIAGNOSIS — M6281 Muscle weakness (generalized): Secondary | ICD-10-CM | POA: Diagnosis not present

## 2017-12-18 DIAGNOSIS — R262 Difficulty in walking, not elsewhere classified: Secondary | ICD-10-CM | POA: Diagnosis not present

## 2017-12-18 DIAGNOSIS — E119 Type 2 diabetes mellitus without complications: Secondary | ICD-10-CM | POA: Diagnosis not present

## 2017-12-18 DIAGNOSIS — G8102 Flaccid hemiplegia affecting left dominant side: Secondary | ICD-10-CM | POA: Diagnosis not present

## 2017-12-18 DIAGNOSIS — R278 Other lack of coordination: Secondary | ICD-10-CM | POA: Diagnosis not present

## 2017-12-19 DIAGNOSIS — I2510 Atherosclerotic heart disease of native coronary artery without angina pectoris: Secondary | ICD-10-CM | POA: Diagnosis not present

## 2017-12-19 DIAGNOSIS — E119 Type 2 diabetes mellitus without complications: Secondary | ICD-10-CM | POA: Diagnosis not present

## 2017-12-19 DIAGNOSIS — I69354 Hemiplegia and hemiparesis following cerebral infarction affecting left non-dominant side: Secondary | ICD-10-CM | POA: Diagnosis not present

## 2017-12-19 DIAGNOSIS — I255 Ischemic cardiomyopathy: Secondary | ICD-10-CM | POA: Diagnosis not present

## 2017-12-22 DIAGNOSIS — E119 Type 2 diabetes mellitus without complications: Secondary | ICD-10-CM | POA: Diagnosis not present

## 2017-12-22 DIAGNOSIS — R262 Difficulty in walking, not elsewhere classified: Secondary | ICD-10-CM | POA: Diagnosis not present

## 2017-12-22 DIAGNOSIS — Z7409 Other reduced mobility: Secondary | ICD-10-CM | POA: Diagnosis not present

## 2017-12-22 DIAGNOSIS — G8102 Flaccid hemiplegia affecting left dominant side: Secondary | ICD-10-CM | POA: Diagnosis not present

## 2017-12-22 DIAGNOSIS — M6281 Muscle weakness (generalized): Secondary | ICD-10-CM | POA: Diagnosis not present

## 2017-12-22 DIAGNOSIS — I110 Hypertensive heart disease with heart failure: Secondary | ICD-10-CM | POA: Diagnosis not present

## 2017-12-22 DIAGNOSIS — R278 Other lack of coordination: Secondary | ICD-10-CM | POA: Diagnosis not present

## 2017-12-22 DIAGNOSIS — Z89421 Acquired absence of other right toe(s): Secondary | ICD-10-CM | POA: Diagnosis not present

## 2017-12-22 DIAGNOSIS — I509 Heart failure, unspecified: Secondary | ICD-10-CM | POA: Diagnosis not present

## 2017-12-23 DIAGNOSIS — I69354 Hemiplegia and hemiparesis following cerebral infarction affecting left non-dominant side: Secondary | ICD-10-CM | POA: Diagnosis not present

## 2017-12-23 DIAGNOSIS — I255 Ischemic cardiomyopathy: Secondary | ICD-10-CM | POA: Diagnosis not present

## 2017-12-23 DIAGNOSIS — I2510 Atherosclerotic heart disease of native coronary artery without angina pectoris: Secondary | ICD-10-CM | POA: Diagnosis not present

## 2017-12-23 DIAGNOSIS — E119 Type 2 diabetes mellitus without complications: Secondary | ICD-10-CM | POA: Diagnosis not present

## 2017-12-24 DIAGNOSIS — Z7409 Other reduced mobility: Secondary | ICD-10-CM | POA: Diagnosis not present

## 2017-12-24 DIAGNOSIS — I509 Heart failure, unspecified: Secondary | ICD-10-CM | POA: Diagnosis not present

## 2017-12-24 DIAGNOSIS — M6281 Muscle weakness (generalized): Secondary | ICD-10-CM | POA: Diagnosis not present

## 2017-12-24 DIAGNOSIS — I2510 Atherosclerotic heart disease of native coronary artery without angina pectoris: Secondary | ICD-10-CM | POA: Diagnosis not present

## 2017-12-24 DIAGNOSIS — R262 Difficulty in walking, not elsewhere classified: Secondary | ICD-10-CM | POA: Diagnosis not present

## 2017-12-24 DIAGNOSIS — R278 Other lack of coordination: Secondary | ICD-10-CM | POA: Diagnosis not present

## 2017-12-24 DIAGNOSIS — G8102 Flaccid hemiplegia affecting left dominant side: Secondary | ICD-10-CM | POA: Diagnosis not present

## 2017-12-24 DIAGNOSIS — Z89421 Acquired absence of other right toe(s): Secondary | ICD-10-CM | POA: Diagnosis not present

## 2017-12-24 DIAGNOSIS — F329 Major depressive disorder, single episode, unspecified: Secondary | ICD-10-CM | POA: Diagnosis not present

## 2017-12-24 DIAGNOSIS — F419 Anxiety disorder, unspecified: Secondary | ICD-10-CM | POA: Diagnosis not present

## 2017-12-24 DIAGNOSIS — I110 Hypertensive heart disease with heart failure: Secondary | ICD-10-CM | POA: Diagnosis not present

## 2017-12-24 DIAGNOSIS — Z87891 Personal history of nicotine dependence: Secondary | ICD-10-CM | POA: Diagnosis not present

## 2017-12-24 DIAGNOSIS — E119 Type 2 diabetes mellitus without complications: Secondary | ICD-10-CM | POA: Diagnosis not present

## 2017-12-24 DIAGNOSIS — I69354 Hemiplegia and hemiparesis following cerebral infarction affecting left non-dominant side: Secondary | ICD-10-CM | POA: Diagnosis not present

## 2017-12-24 DIAGNOSIS — I255 Ischemic cardiomyopathy: Secondary | ICD-10-CM | POA: Diagnosis not present

## 2017-12-25 DIAGNOSIS — I69354 Hemiplegia and hemiparesis following cerebral infarction affecting left non-dominant side: Secondary | ICD-10-CM | POA: Diagnosis not present

## 2017-12-25 DIAGNOSIS — E119 Type 2 diabetes mellitus without complications: Secondary | ICD-10-CM | POA: Diagnosis not present

## 2017-12-25 DIAGNOSIS — I255 Ischemic cardiomyopathy: Secondary | ICD-10-CM | POA: Diagnosis not present

## 2017-12-25 DIAGNOSIS — I2510 Atherosclerotic heart disease of native coronary artery without angina pectoris: Secondary | ICD-10-CM | POA: Diagnosis not present

## 2017-12-26 DIAGNOSIS — Z89421 Acquired absence of other right toe(s): Secondary | ICD-10-CM | POA: Diagnosis not present

## 2017-12-26 DIAGNOSIS — E119 Type 2 diabetes mellitus without complications: Secondary | ICD-10-CM | POA: Diagnosis not present

## 2017-12-26 DIAGNOSIS — I2510 Atherosclerotic heart disease of native coronary artery without angina pectoris: Secondary | ICD-10-CM | POA: Diagnosis not present

## 2017-12-26 DIAGNOSIS — I509 Heart failure, unspecified: Secondary | ICD-10-CM | POA: Diagnosis not present

## 2017-12-26 DIAGNOSIS — M6281 Muscle weakness (generalized): Secondary | ICD-10-CM | POA: Diagnosis not present

## 2017-12-26 DIAGNOSIS — I110 Hypertensive heart disease with heart failure: Secondary | ICD-10-CM | POA: Diagnosis not present

## 2017-12-26 DIAGNOSIS — R278 Other lack of coordination: Secondary | ICD-10-CM | POA: Diagnosis not present

## 2017-12-26 DIAGNOSIS — R262 Difficulty in walking, not elsewhere classified: Secondary | ICD-10-CM | POA: Diagnosis not present

## 2017-12-26 DIAGNOSIS — I69354 Hemiplegia and hemiparesis following cerebral infarction affecting left non-dominant side: Secondary | ICD-10-CM | POA: Diagnosis not present

## 2017-12-26 DIAGNOSIS — I255 Ischemic cardiomyopathy: Secondary | ICD-10-CM | POA: Diagnosis not present

## 2017-12-26 DIAGNOSIS — G8102 Flaccid hemiplegia affecting left dominant side: Secondary | ICD-10-CM | POA: Diagnosis not present

## 2017-12-26 DIAGNOSIS — Z7409 Other reduced mobility: Secondary | ICD-10-CM | POA: Diagnosis not present

## 2017-12-29 DIAGNOSIS — M6281 Muscle weakness (generalized): Secondary | ICD-10-CM | POA: Diagnosis not present

## 2017-12-29 DIAGNOSIS — I509 Heart failure, unspecified: Secondary | ICD-10-CM | POA: Diagnosis not present

## 2017-12-29 DIAGNOSIS — I69354 Hemiplegia and hemiparesis following cerebral infarction affecting left non-dominant side: Secondary | ICD-10-CM | POA: Diagnosis not present

## 2017-12-29 DIAGNOSIS — I110 Hypertensive heart disease with heart failure: Secondary | ICD-10-CM | POA: Diagnosis not present

## 2017-12-29 DIAGNOSIS — R278 Other lack of coordination: Secondary | ICD-10-CM | POA: Diagnosis not present

## 2017-12-29 DIAGNOSIS — R262 Difficulty in walking, not elsewhere classified: Secondary | ICD-10-CM | POA: Diagnosis not present

## 2017-12-29 DIAGNOSIS — I255 Ischemic cardiomyopathy: Secondary | ICD-10-CM | POA: Diagnosis not present

## 2017-12-29 DIAGNOSIS — I2510 Atherosclerotic heart disease of native coronary artery without angina pectoris: Secondary | ICD-10-CM | POA: Diagnosis not present

## 2017-12-29 DIAGNOSIS — G8102 Flaccid hemiplegia affecting left dominant side: Secondary | ICD-10-CM | POA: Diagnosis not present

## 2017-12-29 DIAGNOSIS — Z7409 Other reduced mobility: Secondary | ICD-10-CM | POA: Diagnosis not present

## 2017-12-29 DIAGNOSIS — E119 Type 2 diabetes mellitus without complications: Secondary | ICD-10-CM | POA: Diagnosis not present

## 2017-12-29 DIAGNOSIS — Z89421 Acquired absence of other right toe(s): Secondary | ICD-10-CM | POA: Diagnosis not present

## 2017-12-30 DIAGNOSIS — E119 Type 2 diabetes mellitus without complications: Secondary | ICD-10-CM | POA: Diagnosis not present

## 2017-12-30 DIAGNOSIS — I255 Ischemic cardiomyopathy: Secondary | ICD-10-CM | POA: Diagnosis not present

## 2017-12-30 DIAGNOSIS — I69354 Hemiplegia and hemiparesis following cerebral infarction affecting left non-dominant side: Secondary | ICD-10-CM | POA: Diagnosis not present

## 2017-12-30 DIAGNOSIS — I2510 Atherosclerotic heart disease of native coronary artery without angina pectoris: Secondary | ICD-10-CM | POA: Diagnosis not present

## 2017-12-31 DIAGNOSIS — Z89421 Acquired absence of other right toe(s): Secondary | ICD-10-CM | POA: Diagnosis not present

## 2017-12-31 DIAGNOSIS — R262 Difficulty in walking, not elsewhere classified: Secondary | ICD-10-CM | POA: Diagnosis not present

## 2017-12-31 DIAGNOSIS — M6281 Muscle weakness (generalized): Secondary | ICD-10-CM | POA: Diagnosis not present

## 2017-12-31 DIAGNOSIS — I69354 Hemiplegia and hemiparesis following cerebral infarction affecting left non-dominant side: Secondary | ICD-10-CM | POA: Diagnosis not present

## 2017-12-31 DIAGNOSIS — E119 Type 2 diabetes mellitus without complications: Secondary | ICD-10-CM | POA: Diagnosis not present

## 2017-12-31 DIAGNOSIS — I255 Ischemic cardiomyopathy: Secondary | ICD-10-CM | POA: Diagnosis not present

## 2017-12-31 DIAGNOSIS — R278 Other lack of coordination: Secondary | ICD-10-CM | POA: Diagnosis not present

## 2017-12-31 DIAGNOSIS — Z7409 Other reduced mobility: Secondary | ICD-10-CM | POA: Diagnosis not present

## 2017-12-31 DIAGNOSIS — I2510 Atherosclerotic heart disease of native coronary artery without angina pectoris: Secondary | ICD-10-CM | POA: Diagnosis not present

## 2017-12-31 DIAGNOSIS — G8102 Flaccid hemiplegia affecting left dominant side: Secondary | ICD-10-CM | POA: Diagnosis not present

## 2017-12-31 DIAGNOSIS — I509 Heart failure, unspecified: Secondary | ICD-10-CM | POA: Diagnosis not present

## 2017-12-31 DIAGNOSIS — I110 Hypertensive heart disease with heart failure: Secondary | ICD-10-CM | POA: Diagnosis not present

## 2018-01-01 DIAGNOSIS — I69354 Hemiplegia and hemiparesis following cerebral infarction affecting left non-dominant side: Secondary | ICD-10-CM | POA: Diagnosis not present

## 2018-01-01 DIAGNOSIS — R278 Other lack of coordination: Secondary | ICD-10-CM | POA: Diagnosis not present

## 2018-01-01 DIAGNOSIS — I255 Ischemic cardiomyopathy: Secondary | ICD-10-CM | POA: Diagnosis not present

## 2018-01-01 DIAGNOSIS — Z89421 Acquired absence of other right toe(s): Secondary | ICD-10-CM | POA: Diagnosis not present

## 2018-01-01 DIAGNOSIS — M6281 Muscle weakness (generalized): Secondary | ICD-10-CM | POA: Diagnosis not present

## 2018-01-01 DIAGNOSIS — G8102 Flaccid hemiplegia affecting left dominant side: Secondary | ICD-10-CM | POA: Diagnosis not present

## 2018-01-01 DIAGNOSIS — Z7409 Other reduced mobility: Secondary | ICD-10-CM | POA: Diagnosis not present

## 2018-01-01 DIAGNOSIS — R262 Difficulty in walking, not elsewhere classified: Secondary | ICD-10-CM | POA: Diagnosis not present

## 2018-01-01 DIAGNOSIS — E119 Type 2 diabetes mellitus without complications: Secondary | ICD-10-CM | POA: Diagnosis not present

## 2018-01-01 DIAGNOSIS — I110 Hypertensive heart disease with heart failure: Secondary | ICD-10-CM | POA: Diagnosis not present

## 2018-01-01 DIAGNOSIS — I2510 Atherosclerotic heart disease of native coronary artery without angina pectoris: Secondary | ICD-10-CM | POA: Diagnosis not present

## 2018-01-01 DIAGNOSIS — I509 Heart failure, unspecified: Secondary | ICD-10-CM | POA: Diagnosis not present

## 2018-01-02 ENCOUNTER — Ambulatory Visit (HOSPITAL_BASED_OUTPATIENT_CLINIC_OR_DEPARTMENT_OTHER): Payer: Medicare HMO | Admitting: Physical Medicine & Rehabilitation

## 2018-01-02 ENCOUNTER — Encounter: Payer: Medicare HMO | Attending: Physical Medicine & Rehabilitation

## 2018-01-02 ENCOUNTER — Encounter: Payer: Self-pay | Admitting: Physical Medicine & Rehabilitation

## 2018-01-02 VITALS — BP 129/84 | HR 83 | Ht 71.0 in | Wt 264.0 lb

## 2018-01-02 DIAGNOSIS — G8102 Flaccid hemiplegia affecting left dominant side: Secondary | ICD-10-CM | POA: Diagnosis not present

## 2018-01-02 DIAGNOSIS — J449 Chronic obstructive pulmonary disease, unspecified: Secondary | ICD-10-CM | POA: Insufficient documentation

## 2018-01-02 DIAGNOSIS — I110 Hypertensive heart disease with heart failure: Secondary | ICD-10-CM | POA: Insufficient documentation

## 2018-01-02 DIAGNOSIS — G8110 Spastic hemiplegia affecting unspecified side: Secondary | ICD-10-CM | POA: Diagnosis not present

## 2018-01-02 DIAGNOSIS — E782 Mixed hyperlipidemia: Secondary | ICD-10-CM | POA: Diagnosis not present

## 2018-01-02 DIAGNOSIS — I2510 Atherosclerotic heart disease of native coronary artery without angina pectoris: Secondary | ICD-10-CM | POA: Diagnosis not present

## 2018-01-02 DIAGNOSIS — Z79899 Other long term (current) drug therapy: Secondary | ICD-10-CM | POA: Diagnosis not present

## 2018-01-02 DIAGNOSIS — M1990 Unspecified osteoarthritis, unspecified site: Secondary | ICD-10-CM | POA: Diagnosis not present

## 2018-01-02 DIAGNOSIS — F419 Anxiety disorder, unspecified: Secondary | ICD-10-CM | POA: Diagnosis not present

## 2018-01-02 DIAGNOSIS — R262 Difficulty in walking, not elsewhere classified: Secondary | ICD-10-CM | POA: Diagnosis not present

## 2018-01-02 DIAGNOSIS — E119 Type 2 diabetes mellitus without complications: Secondary | ICD-10-CM | POA: Insufficient documentation

## 2018-01-02 DIAGNOSIS — F1120 Opioid dependence, uncomplicated: Secondary | ICD-10-CM | POA: Diagnosis not present

## 2018-01-02 DIAGNOSIS — I69354 Hemiplegia and hemiparesis following cerebral infarction affecting left non-dominant side: Secondary | ICD-10-CM | POA: Diagnosis not present

## 2018-01-02 DIAGNOSIS — I639 Cerebral infarction, unspecified: Secondary | ICD-10-CM | POA: Diagnosis not present

## 2018-01-02 DIAGNOSIS — F17210 Nicotine dependence, cigarettes, uncomplicated: Secondary | ICD-10-CM | POA: Diagnosis not present

## 2018-01-02 DIAGNOSIS — G894 Chronic pain syndrome: Secondary | ICD-10-CM | POA: Diagnosis not present

## 2018-01-02 DIAGNOSIS — G4733 Obstructive sleep apnea (adult) (pediatric): Secondary | ICD-10-CM | POA: Diagnosis not present

## 2018-01-02 DIAGNOSIS — Z89421 Acquired absence of other right toe(s): Secondary | ICD-10-CM | POA: Diagnosis not present

## 2018-01-02 DIAGNOSIS — I509 Heart failure, unspecified: Secondary | ICD-10-CM | POA: Insufficient documentation

## 2018-01-02 DIAGNOSIS — R278 Other lack of coordination: Secondary | ICD-10-CM | POA: Diagnosis not present

## 2018-01-02 DIAGNOSIS — M6281 Muscle weakness (generalized): Secondary | ICD-10-CM | POA: Diagnosis not present

## 2018-01-02 DIAGNOSIS — I69398 Other sequelae of cerebral infarction: Secondary | ICD-10-CM | POA: Diagnosis not present

## 2018-01-02 DIAGNOSIS — I252 Old myocardial infarction: Secondary | ICD-10-CM | POA: Diagnosis not present

## 2018-01-02 DIAGNOSIS — I255 Ischemic cardiomyopathy: Secondary | ICD-10-CM | POA: Diagnosis not present

## 2018-01-02 DIAGNOSIS — Z7409 Other reduced mobility: Secondary | ICD-10-CM | POA: Diagnosis not present

## 2018-01-02 NOTE — Patient Instructions (Signed)

## 2018-01-02 NOTE — Progress Notes (Signed)
Botox Injection for spasticity using needle EMG guidance  Dilution: 50 Units/ml Indication: Severe spasticity which interferes with ADL,mobility and/or  hygiene and is unresponsive to medication management and other conservative care Informed consent was obtained after describing risks and benefits of the procedure with the patient. This includes bleeding, bruising, infection, excessive weakness, or medication side effects. A REMS form is on file and signed. Needle: 25g 2" for Lower ext, 27g 1" for Upper ext needle electrode Number of units per muscle Left upper limb FDP 50 FDS 50 FCR 25 FPL 25  Left lower limb Tib post 50 U  Hamstring 200 medial All injections were done after obtaining appropriate EMG activity and after negative drawback for blood. The patient tolerated the procedure well. Post procedure instructions were given. A followup appointment was made.

## 2018-01-05 DIAGNOSIS — Z7409 Other reduced mobility: Secondary | ICD-10-CM | POA: Diagnosis not present

## 2018-01-05 DIAGNOSIS — R278 Other lack of coordination: Secondary | ICD-10-CM | POA: Diagnosis not present

## 2018-01-05 DIAGNOSIS — I69354 Hemiplegia and hemiparesis following cerebral infarction affecting left non-dominant side: Secondary | ICD-10-CM | POA: Diagnosis not present

## 2018-01-05 DIAGNOSIS — I255 Ischemic cardiomyopathy: Secondary | ICD-10-CM | POA: Diagnosis not present

## 2018-01-05 DIAGNOSIS — G8102 Flaccid hemiplegia affecting left dominant side: Secondary | ICD-10-CM | POA: Diagnosis not present

## 2018-01-05 DIAGNOSIS — E119 Type 2 diabetes mellitus without complications: Secondary | ICD-10-CM | POA: Diagnosis not present

## 2018-01-05 DIAGNOSIS — Z89421 Acquired absence of other right toe(s): Secondary | ICD-10-CM | POA: Diagnosis not present

## 2018-01-05 DIAGNOSIS — I2510 Atherosclerotic heart disease of native coronary artery without angina pectoris: Secondary | ICD-10-CM | POA: Diagnosis not present

## 2018-01-05 DIAGNOSIS — I110 Hypertensive heart disease with heart failure: Secondary | ICD-10-CM | POA: Diagnosis not present

## 2018-01-05 DIAGNOSIS — M6281 Muscle weakness (generalized): Secondary | ICD-10-CM | POA: Diagnosis not present

## 2018-01-05 DIAGNOSIS — I509 Heart failure, unspecified: Secondary | ICD-10-CM | POA: Diagnosis not present

## 2018-01-05 DIAGNOSIS — R262 Difficulty in walking, not elsewhere classified: Secondary | ICD-10-CM | POA: Diagnosis not present

## 2018-01-06 DIAGNOSIS — I69354 Hemiplegia and hemiparesis following cerebral infarction affecting left non-dominant side: Secondary | ICD-10-CM | POA: Diagnosis not present

## 2018-01-06 DIAGNOSIS — E119 Type 2 diabetes mellitus without complications: Secondary | ICD-10-CM | POA: Diagnosis not present

## 2018-01-06 DIAGNOSIS — I2510 Atherosclerotic heart disease of native coronary artery without angina pectoris: Secondary | ICD-10-CM | POA: Diagnosis not present

## 2018-01-06 DIAGNOSIS — I255 Ischemic cardiomyopathy: Secondary | ICD-10-CM | POA: Diagnosis not present

## 2018-01-07 DIAGNOSIS — I255 Ischemic cardiomyopathy: Secondary | ICD-10-CM | POA: Diagnosis not present

## 2018-01-07 DIAGNOSIS — Z7409 Other reduced mobility: Secondary | ICD-10-CM | POA: Diagnosis not present

## 2018-01-07 DIAGNOSIS — R278 Other lack of coordination: Secondary | ICD-10-CM | POA: Diagnosis not present

## 2018-01-07 DIAGNOSIS — I509 Heart failure, unspecified: Secondary | ICD-10-CM | POA: Diagnosis not present

## 2018-01-07 DIAGNOSIS — M6281 Muscle weakness (generalized): Secondary | ICD-10-CM | POA: Diagnosis not present

## 2018-01-07 DIAGNOSIS — R262 Difficulty in walking, not elsewhere classified: Secondary | ICD-10-CM | POA: Diagnosis not present

## 2018-01-07 DIAGNOSIS — G8102 Flaccid hemiplegia affecting left dominant side: Secondary | ICD-10-CM | POA: Diagnosis not present

## 2018-01-07 DIAGNOSIS — I2510 Atherosclerotic heart disease of native coronary artery without angina pectoris: Secondary | ICD-10-CM | POA: Diagnosis not present

## 2018-01-07 DIAGNOSIS — Z89421 Acquired absence of other right toe(s): Secondary | ICD-10-CM | POA: Diagnosis not present

## 2018-01-07 DIAGNOSIS — E119 Type 2 diabetes mellitus without complications: Secondary | ICD-10-CM | POA: Diagnosis not present

## 2018-01-07 DIAGNOSIS — I69354 Hemiplegia and hemiparesis following cerebral infarction affecting left non-dominant side: Secondary | ICD-10-CM | POA: Diagnosis not present

## 2018-01-07 DIAGNOSIS — I110 Hypertensive heart disease with heart failure: Secondary | ICD-10-CM | POA: Diagnosis not present

## 2018-01-08 DIAGNOSIS — E119 Type 2 diabetes mellitus without complications: Secondary | ICD-10-CM | POA: Diagnosis not present

## 2018-01-08 DIAGNOSIS — J449 Chronic obstructive pulmonary disease, unspecified: Secondary | ICD-10-CM | POA: Diagnosis not present

## 2018-01-08 DIAGNOSIS — I69354 Hemiplegia and hemiparesis following cerebral infarction affecting left non-dominant side: Secondary | ICD-10-CM | POA: Diagnosis not present

## 2018-01-08 DIAGNOSIS — I69398 Other sequelae of cerebral infarction: Secondary | ICD-10-CM | POA: Diagnosis not present

## 2018-01-08 DIAGNOSIS — I255 Ischemic cardiomyopathy: Secondary | ICD-10-CM | POA: Diagnosis not present

## 2018-01-08 DIAGNOSIS — I2510 Atherosclerotic heart disease of native coronary artery without angina pectoris: Secondary | ICD-10-CM | POA: Diagnosis not present

## 2018-01-09 DIAGNOSIS — M6281 Muscle weakness (generalized): Secondary | ICD-10-CM | POA: Diagnosis not present

## 2018-01-09 DIAGNOSIS — I2510 Atherosclerotic heart disease of native coronary artery without angina pectoris: Secondary | ICD-10-CM | POA: Diagnosis not present

## 2018-01-09 DIAGNOSIS — Z7409 Other reduced mobility: Secondary | ICD-10-CM | POA: Diagnosis not present

## 2018-01-09 DIAGNOSIS — E119 Type 2 diabetes mellitus without complications: Secondary | ICD-10-CM | POA: Diagnosis not present

## 2018-01-09 DIAGNOSIS — Z89421 Acquired absence of other right toe(s): Secondary | ICD-10-CM | POA: Diagnosis not present

## 2018-01-09 DIAGNOSIS — I69354 Hemiplegia and hemiparesis following cerebral infarction affecting left non-dominant side: Secondary | ICD-10-CM | POA: Diagnosis not present

## 2018-01-09 DIAGNOSIS — R278 Other lack of coordination: Secondary | ICD-10-CM | POA: Diagnosis not present

## 2018-01-09 DIAGNOSIS — I509 Heart failure, unspecified: Secondary | ICD-10-CM | POA: Diagnosis not present

## 2018-01-09 DIAGNOSIS — G8102 Flaccid hemiplegia affecting left dominant side: Secondary | ICD-10-CM | POA: Diagnosis not present

## 2018-01-09 DIAGNOSIS — I255 Ischemic cardiomyopathy: Secondary | ICD-10-CM | POA: Diagnosis not present

## 2018-01-09 DIAGNOSIS — I110 Hypertensive heart disease with heart failure: Secondary | ICD-10-CM | POA: Diagnosis not present

## 2018-01-09 DIAGNOSIS — R262 Difficulty in walking, not elsewhere classified: Secondary | ICD-10-CM | POA: Diagnosis not present

## 2018-01-12 DIAGNOSIS — G8102 Flaccid hemiplegia affecting left dominant side: Secondary | ICD-10-CM | POA: Diagnosis not present

## 2018-01-13 DIAGNOSIS — I2510 Atherosclerotic heart disease of native coronary artery without angina pectoris: Secondary | ICD-10-CM | POA: Diagnosis not present

## 2018-01-13 DIAGNOSIS — I255 Ischemic cardiomyopathy: Secondary | ICD-10-CM | POA: Diagnosis not present

## 2018-01-13 DIAGNOSIS — I69354 Hemiplegia and hemiparesis following cerebral infarction affecting left non-dominant side: Secondary | ICD-10-CM | POA: Diagnosis not present

## 2018-01-13 DIAGNOSIS — E119 Type 2 diabetes mellitus without complications: Secondary | ICD-10-CM | POA: Diagnosis not present

## 2018-01-14 DIAGNOSIS — I2510 Atherosclerotic heart disease of native coronary artery without angina pectoris: Secondary | ICD-10-CM | POA: Diagnosis not present

## 2018-01-14 DIAGNOSIS — I255 Ischemic cardiomyopathy: Secondary | ICD-10-CM | POA: Diagnosis not present

## 2018-01-14 DIAGNOSIS — G8102 Flaccid hemiplegia affecting left dominant side: Secondary | ICD-10-CM | POA: Diagnosis not present

## 2018-01-14 DIAGNOSIS — I69354 Hemiplegia and hemiparesis following cerebral infarction affecting left non-dominant side: Secondary | ICD-10-CM | POA: Diagnosis not present

## 2018-01-14 DIAGNOSIS — E119 Type 2 diabetes mellitus without complications: Secondary | ICD-10-CM | POA: Diagnosis not present

## 2018-01-19 DIAGNOSIS — I2510 Atherosclerotic heart disease of native coronary artery without angina pectoris: Secondary | ICD-10-CM | POA: Diagnosis not present

## 2018-01-19 DIAGNOSIS — I69354 Hemiplegia and hemiparesis following cerebral infarction affecting left non-dominant side: Secondary | ICD-10-CM | POA: Diagnosis not present

## 2018-01-19 DIAGNOSIS — G8102 Flaccid hemiplegia affecting left dominant side: Secondary | ICD-10-CM | POA: Diagnosis not present

## 2018-01-19 DIAGNOSIS — I255 Ischemic cardiomyopathy: Secondary | ICD-10-CM | POA: Diagnosis not present

## 2018-01-19 DIAGNOSIS — E119 Type 2 diabetes mellitus without complications: Secondary | ICD-10-CM | POA: Diagnosis not present

## 2018-01-20 DIAGNOSIS — E119 Type 2 diabetes mellitus without complications: Secondary | ICD-10-CM | POA: Diagnosis not present

## 2018-01-20 DIAGNOSIS — I2510 Atherosclerotic heart disease of native coronary artery without angina pectoris: Secondary | ICD-10-CM | POA: Diagnosis not present

## 2018-01-20 DIAGNOSIS — I69354 Hemiplegia and hemiparesis following cerebral infarction affecting left non-dominant side: Secondary | ICD-10-CM | POA: Diagnosis not present

## 2018-01-20 DIAGNOSIS — G8102 Flaccid hemiplegia affecting left dominant side: Secondary | ICD-10-CM | POA: Diagnosis not present

## 2018-01-20 DIAGNOSIS — I255 Ischemic cardiomyopathy: Secondary | ICD-10-CM | POA: Diagnosis not present

## 2018-01-21 DIAGNOSIS — I2510 Atherosclerotic heart disease of native coronary artery without angina pectoris: Secondary | ICD-10-CM | POA: Diagnosis not present

## 2018-01-21 DIAGNOSIS — E119 Type 2 diabetes mellitus without complications: Secondary | ICD-10-CM | POA: Diagnosis not present

## 2018-01-21 DIAGNOSIS — I255 Ischemic cardiomyopathy: Secondary | ICD-10-CM | POA: Diagnosis not present

## 2018-01-21 DIAGNOSIS — G8102 Flaccid hemiplegia affecting left dominant side: Secondary | ICD-10-CM | POA: Diagnosis not present

## 2018-01-21 DIAGNOSIS — I69354 Hemiplegia and hemiparesis following cerebral infarction affecting left non-dominant side: Secondary | ICD-10-CM | POA: Diagnosis not present

## 2018-01-23 DIAGNOSIS — I255 Ischemic cardiomyopathy: Secondary | ICD-10-CM | POA: Diagnosis not present

## 2018-01-23 DIAGNOSIS — E119 Type 2 diabetes mellitus without complications: Secondary | ICD-10-CM | POA: Diagnosis not present

## 2018-01-23 DIAGNOSIS — I69354 Hemiplegia and hemiparesis following cerebral infarction affecting left non-dominant side: Secondary | ICD-10-CM | POA: Diagnosis not present

## 2018-01-23 DIAGNOSIS — I2510 Atherosclerotic heart disease of native coronary artery without angina pectoris: Secondary | ICD-10-CM | POA: Diagnosis not present

## 2018-01-26 DIAGNOSIS — I255 Ischemic cardiomyopathy: Secondary | ICD-10-CM | POA: Diagnosis not present

## 2018-01-26 DIAGNOSIS — I69354 Hemiplegia and hemiparesis following cerebral infarction affecting left non-dominant side: Secondary | ICD-10-CM | POA: Diagnosis not present

## 2018-01-26 DIAGNOSIS — G8102 Flaccid hemiplegia affecting left dominant side: Secondary | ICD-10-CM | POA: Diagnosis not present

## 2018-01-26 DIAGNOSIS — E119 Type 2 diabetes mellitus without complications: Secondary | ICD-10-CM | POA: Diagnosis not present

## 2018-01-26 DIAGNOSIS — I2510 Atherosclerotic heart disease of native coronary artery without angina pectoris: Secondary | ICD-10-CM | POA: Diagnosis not present

## 2018-01-27 DIAGNOSIS — I69354 Hemiplegia and hemiparesis following cerebral infarction affecting left non-dominant side: Secondary | ICD-10-CM | POA: Diagnosis not present

## 2018-01-27 DIAGNOSIS — I2510 Atherosclerotic heart disease of native coronary artery without angina pectoris: Secondary | ICD-10-CM | POA: Diagnosis not present

## 2018-01-27 DIAGNOSIS — E119 Type 2 diabetes mellitus without complications: Secondary | ICD-10-CM | POA: Diagnosis not present

## 2018-01-27 DIAGNOSIS — I255 Ischemic cardiomyopathy: Secondary | ICD-10-CM | POA: Diagnosis not present

## 2018-01-28 DIAGNOSIS — F419 Anxiety disorder, unspecified: Secondary | ICD-10-CM | POA: Diagnosis not present

## 2018-01-28 DIAGNOSIS — E119 Type 2 diabetes mellitus without complications: Secondary | ICD-10-CM | POA: Diagnosis not present

## 2018-01-28 DIAGNOSIS — F329 Major depressive disorder, single episode, unspecified: Secondary | ICD-10-CM | POA: Diagnosis not present

## 2018-01-28 DIAGNOSIS — G8102 Flaccid hemiplegia affecting left dominant side: Secondary | ICD-10-CM | POA: Diagnosis not present

## 2018-01-28 DIAGNOSIS — R5383 Other fatigue: Secondary | ICD-10-CM | POA: Diagnosis not present

## 2018-01-28 DIAGNOSIS — I2510 Atherosclerotic heart disease of native coronary artery without angina pectoris: Secondary | ICD-10-CM | POA: Diagnosis not present

## 2018-01-28 DIAGNOSIS — I255 Ischemic cardiomyopathy: Secondary | ICD-10-CM | POA: Diagnosis not present

## 2018-01-28 DIAGNOSIS — Z87891 Personal history of nicotine dependence: Secondary | ICD-10-CM | POA: Diagnosis not present

## 2018-01-28 DIAGNOSIS — I69354 Hemiplegia and hemiparesis following cerebral infarction affecting left non-dominant side: Secondary | ICD-10-CM | POA: Diagnosis not present

## 2018-01-29 DIAGNOSIS — I255 Ischemic cardiomyopathy: Secondary | ICD-10-CM | POA: Diagnosis not present

## 2018-01-29 DIAGNOSIS — I2510 Atherosclerotic heart disease of native coronary artery without angina pectoris: Secondary | ICD-10-CM | POA: Diagnosis not present

## 2018-01-29 DIAGNOSIS — I69354 Hemiplegia and hemiparesis following cerebral infarction affecting left non-dominant side: Secondary | ICD-10-CM | POA: Diagnosis not present

## 2018-01-29 DIAGNOSIS — E119 Type 2 diabetes mellitus without complications: Secondary | ICD-10-CM | POA: Diagnosis not present

## 2018-01-30 DIAGNOSIS — G8102 Flaccid hemiplegia affecting left dominant side: Secondary | ICD-10-CM | POA: Diagnosis not present

## 2018-01-30 DIAGNOSIS — E119 Type 2 diabetes mellitus without complications: Secondary | ICD-10-CM | POA: Diagnosis not present

## 2018-01-30 DIAGNOSIS — I255 Ischemic cardiomyopathy: Secondary | ICD-10-CM | POA: Diagnosis not present

## 2018-01-30 DIAGNOSIS — I69354 Hemiplegia and hemiparesis following cerebral infarction affecting left non-dominant side: Secondary | ICD-10-CM | POA: Diagnosis not present

## 2018-01-30 DIAGNOSIS — I2510 Atherosclerotic heart disease of native coronary artery without angina pectoris: Secondary | ICD-10-CM | POA: Diagnosis not present

## 2018-02-01 DIAGNOSIS — I69398 Other sequelae of cerebral infarction: Secondary | ICD-10-CM | POA: Diagnosis not present

## 2018-02-01 DIAGNOSIS — I2510 Atherosclerotic heart disease of native coronary artery without angina pectoris: Secondary | ICD-10-CM | POA: Diagnosis not present

## 2018-02-02 DIAGNOSIS — G8102 Flaccid hemiplegia affecting left dominant side: Secondary | ICD-10-CM | POA: Diagnosis not present

## 2018-02-02 DIAGNOSIS — M79672 Pain in left foot: Secondary | ICD-10-CM | POA: Diagnosis not present

## 2018-02-02 DIAGNOSIS — I639 Cerebral infarction, unspecified: Secondary | ICD-10-CM | POA: Diagnosis not present

## 2018-02-02 DIAGNOSIS — I69354 Hemiplegia and hemiparesis following cerebral infarction affecting left non-dominant side: Secondary | ICD-10-CM | POA: Diagnosis not present

## 2018-02-02 DIAGNOSIS — Z79899 Other long term (current) drug therapy: Secondary | ICD-10-CM | POA: Diagnosis not present

## 2018-02-02 DIAGNOSIS — I2510 Atherosclerotic heart disease of native coronary artery without angina pectoris: Secondary | ICD-10-CM | POA: Diagnosis not present

## 2018-02-02 DIAGNOSIS — I255 Ischemic cardiomyopathy: Secondary | ICD-10-CM | POA: Diagnosis not present

## 2018-02-02 DIAGNOSIS — E119 Type 2 diabetes mellitus without complications: Secondary | ICD-10-CM | POA: Diagnosis not present

## 2018-02-02 DIAGNOSIS — M545 Low back pain: Secondary | ICD-10-CM | POA: Diagnosis not present

## 2018-02-02 DIAGNOSIS — G894 Chronic pain syndrome: Secondary | ICD-10-CM | POA: Diagnosis not present

## 2018-02-04 DIAGNOSIS — E119 Type 2 diabetes mellitus without complications: Secondary | ICD-10-CM | POA: Diagnosis not present

## 2018-02-04 DIAGNOSIS — G8102 Flaccid hemiplegia affecting left dominant side: Secondary | ICD-10-CM | POA: Diagnosis not present

## 2018-02-04 DIAGNOSIS — I69354 Hemiplegia and hemiparesis following cerebral infarction affecting left non-dominant side: Secondary | ICD-10-CM | POA: Diagnosis not present

## 2018-02-04 DIAGNOSIS — I255 Ischemic cardiomyopathy: Secondary | ICD-10-CM | POA: Diagnosis not present

## 2018-02-04 DIAGNOSIS — I2510 Atherosclerotic heart disease of native coronary artery without angina pectoris: Secondary | ICD-10-CM | POA: Diagnosis not present

## 2018-02-05 DIAGNOSIS — I255 Ischemic cardiomyopathy: Secondary | ICD-10-CM | POA: Diagnosis not present

## 2018-02-05 DIAGNOSIS — E119 Type 2 diabetes mellitus without complications: Secondary | ICD-10-CM | POA: Diagnosis not present

## 2018-02-05 DIAGNOSIS — I2510 Atherosclerotic heart disease of native coronary artery without angina pectoris: Secondary | ICD-10-CM | POA: Diagnosis not present

## 2018-02-05 DIAGNOSIS — I69354 Hemiplegia and hemiparesis following cerebral infarction affecting left non-dominant side: Secondary | ICD-10-CM | POA: Diagnosis not present

## 2018-02-06 DIAGNOSIS — I69354 Hemiplegia and hemiparesis following cerebral infarction affecting left non-dominant side: Secondary | ICD-10-CM | POA: Diagnosis not present

## 2018-02-06 DIAGNOSIS — I255 Ischemic cardiomyopathy: Secondary | ICD-10-CM | POA: Diagnosis not present

## 2018-02-06 DIAGNOSIS — E119 Type 2 diabetes mellitus without complications: Secondary | ICD-10-CM | POA: Diagnosis not present

## 2018-02-06 DIAGNOSIS — G8102 Flaccid hemiplegia affecting left dominant side: Secondary | ICD-10-CM | POA: Diagnosis not present

## 2018-02-06 DIAGNOSIS — I2510 Atherosclerotic heart disease of native coronary artery without angina pectoris: Secondary | ICD-10-CM | POA: Diagnosis not present

## 2018-02-07 DIAGNOSIS — I2510 Atherosclerotic heart disease of native coronary artery without angina pectoris: Secondary | ICD-10-CM | POA: Diagnosis not present

## 2018-02-07 DIAGNOSIS — I69398 Other sequelae of cerebral infarction: Secondary | ICD-10-CM | POA: Diagnosis not present

## 2018-02-07 DIAGNOSIS — J449 Chronic obstructive pulmonary disease, unspecified: Secondary | ICD-10-CM | POA: Diagnosis not present

## 2018-02-09 ENCOUNTER — Encounter: Payer: Self-pay | Admitting: Neurology

## 2018-02-09 ENCOUNTER — Ambulatory Visit (INDEPENDENT_AMBULATORY_CARE_PROVIDER_SITE_OTHER): Payer: Medicare HMO | Admitting: Neurology

## 2018-02-09 VITALS — BP 102/68 | HR 96 | Ht 71.0 in | Wt 279.0 lb

## 2018-02-09 DIAGNOSIS — E119 Type 2 diabetes mellitus without complications: Secondary | ICD-10-CM | POA: Diagnosis not present

## 2018-02-09 DIAGNOSIS — I10 Essential (primary) hypertension: Secondary | ICD-10-CM | POA: Diagnosis not present

## 2018-02-09 DIAGNOSIS — R4189 Other symptoms and signs involving cognitive functions and awareness: Secondary | ICD-10-CM | POA: Diagnosis not present

## 2018-02-09 DIAGNOSIS — E118 Type 2 diabetes mellitus with unspecified complications: Secondary | ICD-10-CM

## 2018-02-09 DIAGNOSIS — F17200 Nicotine dependence, unspecified, uncomplicated: Secondary | ICD-10-CM | POA: Diagnosis not present

## 2018-02-09 DIAGNOSIS — I69354 Hemiplegia and hemiparesis following cerebral infarction affecting left non-dominant side: Secondary | ICD-10-CM

## 2018-02-09 DIAGNOSIS — E785 Hyperlipidemia, unspecified: Secondary | ICD-10-CM | POA: Diagnosis not present

## 2018-02-09 DIAGNOSIS — I255 Ischemic cardiomyopathy: Secondary | ICD-10-CM | POA: Diagnosis not present

## 2018-02-09 DIAGNOSIS — Z794 Long term (current) use of insulin: Secondary | ICD-10-CM | POA: Diagnosis not present

## 2018-02-09 DIAGNOSIS — I2510 Atherosclerotic heart disease of native coronary artery without angina pectoris: Secondary | ICD-10-CM | POA: Diagnosis not present

## 2018-02-09 DIAGNOSIS — I429 Cardiomyopathy, unspecified: Secondary | ICD-10-CM | POA: Diagnosis not present

## 2018-02-09 DIAGNOSIS — R483 Visual agnosia: Secondary | ICD-10-CM

## 2018-02-09 NOTE — Progress Notes (Signed)
NEUROLOGY FOLLOW UP OFFICE NOTE  [name] [id number]  HISTORY OF PRESENT ILLNESS: [name] is a 52 year old right-handed male with hypertension, type 2 diabetes mellitus, CAD, cardiomyopathy, COPD, tobacco use disorder, depression and history of prior strokes who follows up for stroke.     UPDATE: Since imaging was negative for DVT or PE, I recommended to discontinue Xarelto and restart Plavix with and ASA.  He is on atorvastatin 40mg  daily.  LDL from 11/12/17 was 87.  Since March, he says he sometimes doesn't recognize his children.  One time, his daughter was in the room and he didn't know who it was until somebody said her name.  Understandably, it scares him.  To evaluate for any new bilateral temporal/occipital infarcts, he had another MRI of brain without contrast on 10/29/17 revealed no acute or subacute findings.  He is scheduled for neurocognitive testing with Dr. Si Raider in September.  He does not experience prosopagnosia too much anymore but he still endorses other memory deficits.   Due to stress, he is smoking again.    HISTORY: He was admitted to [facility] on 06/14/17 with sudden onset left sided weakness and numbness and difficulty speaking.  He initially presented to [facility] where he received tPA with NIHSS of 15 and was then transferred to [facility].    CT of head from [facility] reportedly showed "acute infarction involving portions of the posterior right basal ganglia, right corona radiata and body of the right caudate nucleus".  MRI of brain at [facility] confirmed acute right basal ganglia infarct as well as remote right frontal infarct.  MRA of head and carotid doppler revealed no significant intracranial or extracranial arterial stenosis or occlusion.  He was unable to have CTA due to allergy to iodinated contrast.    Echocardiogram with bubble study showed EF of 25-30% with no evidence of PFO or ASD.  He was evaluated by cardiology for  ischemic cardiomyopathy.  Lexiscan nuclear perfusion study revealed EF 18% with large infarct involving the apex and periapical anterior wall with no evidence of ischemia.  He was advised to follow up with outpatient cardiology.  LDL was 123 and triglycerides were 237.  Hgb A1c was 5.7.  He was already taking ASA 325mg  but not daily, as well as Plavix.  He is continued on ASA and Plavix.  He was continued on rosuvastatin 40mg , Zetia 10mg  and Tricor 145mg  daily.  He was seen in the ED at [facility] on 09/22/17 for headache.  CT of head showed no acute findings but was read as demonstrating a chronic appearing infarct in the right posterior limb of internal capsule that was not present on prior CT from 06/14/17.  He also complained of left leg pain.  He reportedly had an elevated d dimer.  CT chest and venous doppler of lower extremity were negative for DVT.  However, the started him on Xarelto.  He was told to stop Plavix by his PCP.  I contacted his PCP, Dr. Quillian Quince, who reviewed the ED notes and is also unsure why they started Xarelto when imaging was negative for DVT.  PAST MEDICAL HISTORY: Past Medical History:  Diagnosis Date  . Anaphylaxis    IGE mediated  . Anxiety   . Arthritis   . Cardiomyopathy ([place])   . CHF (congestive heart failure) ([place])   . COPD (chronic obstructive pulmonary disease) ([place])   . Coronary atherosclerosis of native coronary artery    Multivessel  status post CABG 2010  . Depression   . Essential hypertension, benign   . History of CVA (cerebrovascular accident) 01/2012   Right posterior frontal cortical and subcortical brain by MRI, no hemorrhage. Carotid Dopplers showed only 1-50% bilateral ICA stenoses. Echocardiogram showed LVEF 50%, no major valvular abnormalities.  . Mixed hyperlipidemia   . Myocardial infarction ([place]) 2010  . OSA (obstructive sleep apnea)   . Stroke ([place])   . Type 2 diabetes mellitus ([facility])     MEDICATIONS: Current  Outpatient Medications on File Prior to Visit  Medication Sig Dispense Refill  . albuterol (PROVENTIL HFA;VENTOLIN HFA) 108 (90 BASE) MCG/ACT inhaler Inhale 2 puffs into the lungs every 6 (six) hours as needed for wheezing or shortness of breath.    . ALPRAZolam (XANAX) 1 MG tablet Take 1 mg by mouth 2 (two) times daily.     [name] aspirin EC 325 MG tablet Take 325 mg by mouth daily.    [name] atorvastatin (LIPITOR) 40 MG tablet Take 1 tablet by mouth daily.    . baclofen (LIORESAL) 10 MG tablet Take 1 tablet (10 mg total) by mouth 3 (three) times daily as needed for muscle spasms. 90 each 1  . carvedilol (COREG) 6.25 MG tablet Take 6.25 mg by mouth 2 (two) times daily with a meal.    . Cholecalciferol (VITAMIN D) 2000 units tablet Take 2,000 Units by mouth daily at 12 noon.    . clopidogrel (PLAVIX) 75 MG tablet Take 1 tablet by mouth daily.    . DULoxetine (CYMBALTA) 60 MG capsule Take 60 mg by mouth daily.    . fenofibrate micronized (LOFIBRA) 134 MG capsule Take 134 mg by mouth daily before breakfast.     . fluticasone (CUTIVATE) 0.05 % cream Apply 1 application topically 2 (two) times daily.    . furosemide (LASIX) 20 MG tablet Take 20 mg by mouth.    . gabapentin (NEURONTIN) 600 MG tablet Take 1 tablet by mouth 3 (three) times daily.    . isosorbide mononitrate (IMDUR) 60 MG 24 hr tablet Take 60 mg by mouth 2 (two) times daily.     . nitroGLYCERIN (NITROSTAT) 0.4 MG SL tablet Place 0.4 mg under the tongue every 5 (five) minutes as needed. For chest pain    . oxyCODONE (OXY IR/ROXICODONE) 5 MG immediate release tablet Take 5 mg by mouth every 8 (eight) hours as needed for severe pain.    . pantoprazole (PROTONIX) 40 MG tablet Take 40 mg by mouth daily.    [name] PARoxetine (PAXIL) 20 MG tablet Take 40 mg by mouth daily.     . phentermine 37.5 MG capsule Take 1 capsule by mouth daily.    . sacubitril-valsartan (ENTRESTO) 49-51 MG TAKE (1) TABLET TWICE DAILY. 60 tablet 3  . vitamin B-12 (CYANOCOBALAMIN)  1000 MCG tablet Take 1,000 mcg by mouth daily at 12 noon.     No current facility-administered medications on file prior to visit.     ALLERGIES: Allergies  Allergen Reactions  . Contrast Media [Iodinated Diagnostic Agents] Anaphylaxis, Shortness Of Breath, Swelling and Rash    Isovue contrast is most acceptable agent based on previous experience and testing with premedications  . Ibuprofen Anaphylaxis, Hives and Swelling  . Nsaids Anaphylaxis, Hives, Swelling and Rash    FAMILY HISTORY: Family History  Problem Relation Age of Onset  . Lung cancer Father   . Heart disease Father   . Heart disease Mother   . Congestive Heart Failure Mother   .  Diabetes Mother   . Hyperlipidemia Mother   . Breast cancer Maternal Aunt     SOCIAL HISTORY: Social History   Socioeconomic History  . Marital status: Legally Separated    Spouse name: Jolayne Haines  . Number of children: 2  . Years of education: Not on file  . Highest education level: 12th grade  Occupational History  . Occupation: disabled  Social Needs  . Financial resource strain: Not on file  . Food insecurity:    Worry: Not on file    Inability: Not on file  . Transportation needs:    Medical: Not on file    Non-medical: Not on file  Tobacco Use  . Smoking status: Current Every Day Smoker    Packs/day: 1.50    Years: 30.00    Pack years: 45.00    Types: Cigarettes    Start date: 06/08/1982  . Smokeless tobacco: Never Used  Substance and Sexual Activity  . Alcohol use: No  . Drug use: No  . Sexual activity: Yes    Birth control/protection: None  Lifestyle  . Physical activity:    Days per week: Not on file    Minutes per session: Not on file  . Stress: Not on file  Relationships  . Social connections:    Talks on phone: Not on file    Gets together: Not on file    Attends religious service: Not on file    Active member of club or organization: Not on file    Attends meetings of clubs or organizations: Not on  file    Relationship status: Not on file  . Intimate partner violence:    Fear of current or ex partner: Not on file    Emotionally abused: Not on file    Physically abused: Not on file    Forced sexual activity: Not on file  Other Topics Concern  . Not on file  Social History Narrative   Disabled since age 42 lives with wife and children      Patient is right-handed. He is married, lives in a 1 story house, has a ramp to enter. Drinks 64oz of Mtn. Dew a day. Prior to CVA was walking daily. 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    REVIEW OF SYSTEMS: Constitutional: No fevers, chills, or sweats, no generalized fatigue, change in appetite Eyes: No visual changes, double vision, eye pain Ear, nose and throat: No hearing loss, ear pain, nasal congestion, sore throat Cardiovascular: No chest pain, palpitations Respiratory:  No shortness of breath at rest or with exertion, wheezes GastrointestinaI: No nausea, vomiting, diarrhea, abdominal pain, fecal incontinence Genitourinary:  No dysuria, urinary retention or frequency Musculoskeletal:  No neck pain, back pain Integumentary: No rash, pruritus, skin lesions Neurological: as above Psychiatric: depression Endocrine: No palpitations, fatigue, diaphoresis, mood swings, change in appetite, change in weight, increased thirst Hematologic/Lymphatic:  No purpura, petechiae. Allergic/Immunologic: no itchy/runny eyes, nasal congestion, recent allergic reactions, rashes  PHYSICAL EXAM: Vitals:   02/09/18 1443  BP: 102/68  Pulse: 96  SpO2: 96%   General: No acute distress.  Patient appears well-groomed.   Head:  Normocephalic/atraumatic Eyes:  Fundi examined but not visualized Neck: supple, no paraspinal tenderness, full range of motion Heart:  Regular rate and rhythm Lungs:  Clear to auscultation bilaterally Back: No paraspinal tenderness Neurological Exam: alert and oriented to person, place, and time, short term  memory poor, remote memory fair, fund of knowledge intact, attention and concentration intact,  speech fluent and not dysarthric, language intact. Cranial nerves:  decreased left V2-V3.  Otherwise, CN II-XII intact.  Bulk & Tone: flaccid in left upper extremity, no fasciculations.  Motor:  Left upper extremity plegic except 2+/5 hand grip, 2+/5 left hip flexion, plegic left knee flexion, left foot drop, left knee extension and right side 5/5.  Sensation:  decreased pinprick in left lower extremity and vibration sensation decreased bilaterally. Deep Tendon Reflexes:  3+ left upper and lower extremities with sustained clonus in the left ankle, 5/5 on right.  Finger to nose testing:  Without dysmetria on right, unable to test left.  Gait:  In wheelchair.  IMPRESSION: 1.  Left hemiplegia as late effect of stroke, likely secondary to small vessel disease.  It appears that the right basal ganglia infarct is not a new finding as it was reportedly seen as an acute infarct on the head CT from 06/14/17 (as per hospital notes from [place]). 2.  Prosopagnosia.   3.  Tobacco use disorder.  Currently using patch 4.  Hypertension 5.  Hyperlipidemia 6.  Cardiomyopathy 7.  Type 2 diabetes 8.  Vascular cognitive impairment    PLAN: 1.  Continue Plavix 75mg  daily and aspirin 325mg  daily 2.  I recommend to increase atorvastatin to 80mg  daily as LDL goal is less than 70. 3.  Continue blood pressure and glycemic control 4.  Follow up for memory testing with Dr. Si Raider 5.  Tobacco cessation counseling (CPT 99406):  Tobacco use with history of CAD, stroke  - Currently smoking 0.5 packs/day   - Patient was informed of the dangers of tobacco abuse including stroke, cancer, and MI, as well as benefits of tobacco cessation. - Patient is not willing to quit at this time. - Approximately 5 mins were spent counseling patient cessation techniques. We discussed various methods to help quit smoking, including deciding on a  date to quit, joining a support group, pharmacological agents- nicotine gum/patch/lozenges, chantix.  - I will reassess his progress at the next follow-up visit 6.  Follow up in 6 months.  25 minutes spent face to face with patient, over 50% spent discussing management.  Metta Clines, DO  CC:  Gar Ponto, MD

## 2018-02-09 NOTE — Patient Instructions (Addendum)
1.  Continue Plavix 75mg  daily and aspirin 325mg  daily 2.  I will recommend that Dr. Quillian Quince increase atorvastatin to 80mg  daily 3.  Continue blood pressure medications 4.  Follow up for memory testing with Dr. Si Raider 5.  Try to work on quitting smoking 6.  Follow up in 6 months.

## 2018-02-10 DIAGNOSIS — I69354 Hemiplegia and hemiparesis following cerebral infarction affecting left non-dominant side: Secondary | ICD-10-CM | POA: Diagnosis not present

## 2018-02-10 DIAGNOSIS — I2510 Atherosclerotic heart disease of native coronary artery without angina pectoris: Secondary | ICD-10-CM | POA: Diagnosis not present

## 2018-02-10 DIAGNOSIS — E119 Type 2 diabetes mellitus without complications: Secondary | ICD-10-CM | POA: Diagnosis not present

## 2018-02-10 DIAGNOSIS — I255 Ischemic cardiomyopathy: Secondary | ICD-10-CM | POA: Diagnosis not present

## 2018-02-11 DIAGNOSIS — I255 Ischemic cardiomyopathy: Secondary | ICD-10-CM | POA: Diagnosis not present

## 2018-02-11 DIAGNOSIS — I2510 Atherosclerotic heart disease of native coronary artery without angina pectoris: Secondary | ICD-10-CM | POA: Diagnosis not present

## 2018-02-11 DIAGNOSIS — E119 Type 2 diabetes mellitus without complications: Secondary | ICD-10-CM | POA: Diagnosis not present

## 2018-02-11 DIAGNOSIS — I69354 Hemiplegia and hemiparesis following cerebral infarction affecting left non-dominant side: Secondary | ICD-10-CM | POA: Diagnosis not present

## 2018-02-12 DIAGNOSIS — I2510 Atherosclerotic heart disease of native coronary artery without angina pectoris: Secondary | ICD-10-CM | POA: Diagnosis not present

## 2018-02-12 DIAGNOSIS — I69354 Hemiplegia and hemiparesis following cerebral infarction affecting left non-dominant side: Secondary | ICD-10-CM | POA: Diagnosis not present

## 2018-02-12 DIAGNOSIS — I255 Ischemic cardiomyopathy: Secondary | ICD-10-CM | POA: Diagnosis not present

## 2018-02-12 DIAGNOSIS — R262 Difficulty in walking, not elsewhere classified: Secondary | ICD-10-CM | POA: Diagnosis not present

## 2018-02-12 DIAGNOSIS — G8102 Flaccid hemiplegia affecting left dominant side: Secondary | ICD-10-CM | POA: Diagnosis not present

## 2018-02-12 DIAGNOSIS — I6930 Unspecified sequelae of cerebral infarction: Secondary | ICD-10-CM | POA: Diagnosis not present

## 2018-02-12 DIAGNOSIS — E119 Type 2 diabetes mellitus without complications: Secondary | ICD-10-CM | POA: Diagnosis not present

## 2018-02-13 ENCOUNTER — Ambulatory Visit: Payer: Medicare HMO | Admitting: Physical Medicine & Rehabilitation

## 2018-02-13 DIAGNOSIS — I2510 Atherosclerotic heart disease of native coronary artery without angina pectoris: Secondary | ICD-10-CM | POA: Diagnosis not present

## 2018-02-13 DIAGNOSIS — I255 Ischemic cardiomyopathy: Secondary | ICD-10-CM | POA: Diagnosis not present

## 2018-02-13 DIAGNOSIS — G8102 Flaccid hemiplegia affecting left dominant side: Secondary | ICD-10-CM | POA: Diagnosis not present

## 2018-02-13 DIAGNOSIS — R262 Difficulty in walking, not elsewhere classified: Secondary | ICD-10-CM | POA: Diagnosis not present

## 2018-02-13 DIAGNOSIS — I69354 Hemiplegia and hemiparesis following cerebral infarction affecting left non-dominant side: Secondary | ICD-10-CM | POA: Diagnosis not present

## 2018-02-13 DIAGNOSIS — E119 Type 2 diabetes mellitus without complications: Secondary | ICD-10-CM | POA: Diagnosis not present

## 2018-02-13 DIAGNOSIS — I6930 Unspecified sequelae of cerebral infarction: Secondary | ICD-10-CM | POA: Diagnosis not present

## 2018-02-16 DIAGNOSIS — I2510 Atherosclerotic heart disease of native coronary artery without angina pectoris: Secondary | ICD-10-CM | POA: Diagnosis not present

## 2018-02-16 DIAGNOSIS — I255 Ischemic cardiomyopathy: Secondary | ICD-10-CM | POA: Diagnosis not present

## 2018-02-16 DIAGNOSIS — E119 Type 2 diabetes mellitus without complications: Secondary | ICD-10-CM | POA: Diagnosis not present

## 2018-02-16 DIAGNOSIS — I69354 Hemiplegia and hemiparesis following cerebral infarction affecting left non-dominant side: Secondary | ICD-10-CM | POA: Diagnosis not present

## 2018-02-17 DIAGNOSIS — I69354 Hemiplegia and hemiparesis following cerebral infarction affecting left non-dominant side: Secondary | ICD-10-CM | POA: Diagnosis not present

## 2018-02-17 DIAGNOSIS — I6930 Unspecified sequelae of cerebral infarction: Secondary | ICD-10-CM | POA: Diagnosis not present

## 2018-02-17 DIAGNOSIS — E119 Type 2 diabetes mellitus without complications: Secondary | ICD-10-CM | POA: Diagnosis not present

## 2018-02-17 DIAGNOSIS — G8102 Flaccid hemiplegia affecting left dominant side: Secondary | ICD-10-CM | POA: Diagnosis not present

## 2018-02-17 DIAGNOSIS — I255 Ischemic cardiomyopathy: Secondary | ICD-10-CM | POA: Diagnosis not present

## 2018-02-17 DIAGNOSIS — R262 Difficulty in walking, not elsewhere classified: Secondary | ICD-10-CM | POA: Diagnosis not present

## 2018-02-17 DIAGNOSIS — I2510 Atherosclerotic heart disease of native coronary artery without angina pectoris: Secondary | ICD-10-CM | POA: Diagnosis not present

## 2018-02-18 DIAGNOSIS — Z0100 Encounter for examination of eyes and vision without abnormal findings: Secondary | ICD-10-CM | POA: Diagnosis not present

## 2018-02-18 DIAGNOSIS — I69354 Hemiplegia and hemiparesis following cerebral infarction affecting left non-dominant side: Secondary | ICD-10-CM | POA: Diagnosis not present

## 2018-02-18 DIAGNOSIS — I255 Ischemic cardiomyopathy: Secondary | ICD-10-CM | POA: Diagnosis not present

## 2018-02-18 DIAGNOSIS — H524 Presbyopia: Secondary | ICD-10-CM | POA: Diagnosis not present

## 2018-02-18 DIAGNOSIS — E119 Type 2 diabetes mellitus without complications: Secondary | ICD-10-CM | POA: Diagnosis not present

## 2018-02-18 DIAGNOSIS — I2510 Atherosclerotic heart disease of native coronary artery without angina pectoris: Secondary | ICD-10-CM | POA: Diagnosis not present

## 2018-02-19 DIAGNOSIS — I2510 Atherosclerotic heart disease of native coronary artery without angina pectoris: Secondary | ICD-10-CM | POA: Diagnosis not present

## 2018-02-19 DIAGNOSIS — R262 Difficulty in walking, not elsewhere classified: Secondary | ICD-10-CM | POA: Diagnosis not present

## 2018-02-19 DIAGNOSIS — I255 Ischemic cardiomyopathy: Secondary | ICD-10-CM | POA: Diagnosis not present

## 2018-02-19 DIAGNOSIS — E119 Type 2 diabetes mellitus without complications: Secondary | ICD-10-CM | POA: Diagnosis not present

## 2018-02-19 DIAGNOSIS — G8102 Flaccid hemiplegia affecting left dominant side: Secondary | ICD-10-CM | POA: Diagnosis not present

## 2018-02-19 DIAGNOSIS — I69354 Hemiplegia and hemiparesis following cerebral infarction affecting left non-dominant side: Secondary | ICD-10-CM | POA: Diagnosis not present

## 2018-02-19 DIAGNOSIS — I6930 Unspecified sequelae of cerebral infarction: Secondary | ICD-10-CM | POA: Diagnosis not present

## 2018-02-20 DIAGNOSIS — I69354 Hemiplegia and hemiparesis following cerebral infarction affecting left non-dominant side: Secondary | ICD-10-CM | POA: Diagnosis not present

## 2018-02-20 DIAGNOSIS — I255 Ischemic cardiomyopathy: Secondary | ICD-10-CM | POA: Diagnosis not present

## 2018-02-20 DIAGNOSIS — E119 Type 2 diabetes mellitus without complications: Secondary | ICD-10-CM | POA: Diagnosis not present

## 2018-02-20 DIAGNOSIS — I2510 Atherosclerotic heart disease of native coronary artery without angina pectoris: Secondary | ICD-10-CM | POA: Diagnosis not present

## 2018-02-23 DIAGNOSIS — I6930 Unspecified sequelae of cerebral infarction: Secondary | ICD-10-CM | POA: Diagnosis not present

## 2018-02-23 DIAGNOSIS — G8102 Flaccid hemiplegia affecting left dominant side: Secondary | ICD-10-CM | POA: Diagnosis not present

## 2018-02-23 DIAGNOSIS — R262 Difficulty in walking, not elsewhere classified: Secondary | ICD-10-CM | POA: Diagnosis not present

## 2018-02-24 ENCOUNTER — Encounter: Payer: Medicare HMO | Attending: Physical Medicine & Rehabilitation

## 2018-02-24 ENCOUNTER — Ambulatory Visit (HOSPITAL_BASED_OUTPATIENT_CLINIC_OR_DEPARTMENT_OTHER): Payer: 59 | Admitting: Physical Medicine & Rehabilitation

## 2018-02-24 ENCOUNTER — Encounter: Payer: Self-pay | Admitting: Physical Medicine & Rehabilitation

## 2018-02-24 VITALS — BP 129/80 | HR 67 | Ht 71.0 in | Wt 287.0 lb

## 2018-02-24 DIAGNOSIS — I509 Heart failure, unspecified: Secondary | ICD-10-CM | POA: Diagnosis not present

## 2018-02-24 DIAGNOSIS — I252 Old myocardial infarction: Secondary | ICD-10-CM | POA: Insufficient documentation

## 2018-02-24 DIAGNOSIS — F419 Anxiety disorder, unspecified: Secondary | ICD-10-CM | POA: Insufficient documentation

## 2018-02-24 DIAGNOSIS — I2510 Atherosclerotic heart disease of native coronary artery without angina pectoris: Secondary | ICD-10-CM | POA: Insufficient documentation

## 2018-02-24 DIAGNOSIS — F17210 Nicotine dependence, cigarettes, uncomplicated: Secondary | ICD-10-CM | POA: Insufficient documentation

## 2018-02-24 DIAGNOSIS — E119 Type 2 diabetes mellitus without complications: Secondary | ICD-10-CM | POA: Insufficient documentation

## 2018-02-24 DIAGNOSIS — I69354 Hemiplegia and hemiparesis following cerebral infarction affecting left non-dominant side: Secondary | ICD-10-CM | POA: Diagnosis not present

## 2018-02-24 DIAGNOSIS — E782 Mixed hyperlipidemia: Secondary | ICD-10-CM | POA: Diagnosis not present

## 2018-02-24 DIAGNOSIS — J449 Chronic obstructive pulmonary disease, unspecified: Secondary | ICD-10-CM | POA: Insufficient documentation

## 2018-02-24 DIAGNOSIS — G8110 Spastic hemiplegia affecting unspecified side: Secondary | ICD-10-CM

## 2018-02-24 DIAGNOSIS — G4733 Obstructive sleep apnea (adult) (pediatric): Secondary | ICD-10-CM | POA: Insufficient documentation

## 2018-02-24 DIAGNOSIS — I110 Hypertensive heart disease with heart failure: Secondary | ICD-10-CM | POA: Diagnosis not present

## 2018-02-24 DIAGNOSIS — M1990 Unspecified osteoarthritis, unspecified site: Secondary | ICD-10-CM | POA: Insufficient documentation

## 2018-02-24 DIAGNOSIS — I255 Ischemic cardiomyopathy: Secondary | ICD-10-CM | POA: Diagnosis not present

## 2018-02-24 NOTE — Progress Notes (Signed)
Subjective:    Patient ID: [name], male    DOB: 11-17-1965, 52 y.o.   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 His spasticity interferes with ambulation with increased hamstring tone.  Also he has difficulty with positioning left upper limb as well as hygiene issues due to spasticity.  He underwent botulinum toxin injection on 01/02/2018 Left upper limb FDP 50 FDS 50 FCR 25 FPL 25  Left lower limb Tib post 50 U  Hamstring 200 medial Patient states that his left elbow is still tight we discussed that this was not injected.  He also feels like he has had some increasing problems with his heel not being seated in the heel cup of his shoe Pain Inventory Average Pain 6 Pain Right Now 4 My pain is dull  In the last 24 hours, has pain interfered with the following? General activity 3 Relation with others 1 Enjoyment of life 6 What TIME of day is your pain at its worst? night Sleep (in general) Fair  Pain is worse with: na Pain improves with: therapy/exercise and medication Relief from Meds: 3  Mobility walk with assistance use a cane use a walker ability to climb steps?  yes do you drive?  no  Function Do you have any goals in this area?  yes  Neuro/Psych weakness numbness tremor tingling trouble walking spasms depression anxiety  Prior Studies Any changes since last visit?  no  Physicians involved in your care Any changes since last visit?  no   Family History  Problem Relation Age of Onset  . Lung cancer Father   . Heart disease Father   . Heart disease Mother   . Congestive Heart Failure Mother   . Diabetes Mother   . Hyperlipidemia Mother   . Breast cancer Maternal Aunt    Social History   Socioeconomic History  . Marital status: Legally Separated    Spouse name: Jolayne Haines  . Number of children: 2  . Years of education: Not on file  . Highest education  level: 12th grade  Occupational History  . Occupation: disabled  Social Needs  . Financial resource strain: Not on file  . Food insecurity:    Worry: Not on file    Inability: Not on file  . Transportation needs:    Medical: Not on file    Non-medical: Not on file  Tobacco Use  . Smoking status: Current Every Day Smoker    Packs/day: 1.50    Years: 30.00    Pack years: 45.00    Types: Cigarettes    Start date: 06/08/1982  . Smokeless tobacco: Never Used  Substance and Sexual Activity  . Alcohol use: No  . Drug use: No  . Sexual activity: Yes    Birth control/protection: None  Lifestyle  . Physical activity:    Days per week: Not on file    Minutes per session: Not on file  . Stress: Not on file  Relationships  . Social connections:    Talks on phone: Not on file    Gets together: Not on file    Attends religious service: Not on file    Active member of club or organization: Not on file    Attends meetings of clubs or organizations: Not on file    Relationship status: Not on file  Other Topics Concern  . Not on file  Social History Narrative  Disabled since age 6 lives with wife and children      Patient is right-handed. He is married, lives in a 1 story house, has a ramp to enter. Drinks 64oz of Mtn. Dew a day. Prior to CVA was walking daily. 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   Past Surgical History:  Procedure Laterality Date  . CHOLECYSTECTOMY    . CORONARY ARTERY BYPASS GRAFT  2010   LIMA to LAD, SVG to diagonal, SVG to OM1 and OM 2, SVG to RCA  . DENTAL SURGERY  2003  . GASTRIC BYPASS  2010  . HERNIA REPAIR  2011, 2012  . INCISIONAL HERNIA REPAIR N/A 07/23/2013   Procedure: HERNIA REPAIR INCISIONAL WITH MESH;  Surgeon: Jamesetta So, MD;  Location: [facility];  Service: General;  Laterality: N/A;  . INSERTION OF MESH N/A 07/23/2013   Procedure: INSERTION OF MESH;  Surgeon: Jamesetta So, MD;  Location: [facility];  Service: General;   Laterality: N/A;  . LEFT HEART CATHETERIZATION WITH CORONARY/GRAFT ANGIOGRAM N/A 11/01/2013   Procedure: LEFT HEART CATHETERIZATION WITH Beatrix Fetters;  Surgeon: Blane Ohara, MD;  Location: [facility] C[facility] LAB;  Service: Cardiovascular;  Laterality: N/A;  . TOE AMPUTATION  1998   right 1st and 2nd toe  . TONSILECTOMY, ADENOIDECTOMY, BILATERAL MYRINGOTOMY AND TUBES    . TONSILLECTOMY    . VENTRAL HERNIA REPAIR N/A 10/28/2012   Procedure: LAPAROSCOPIC VENTRAL HERNIA;  Surgeon: Donato Heinz, MD;  Location: [facility];  Service: General;  Laterality: N/A;   Past Medical History:  Diagnosis Date  . Anaphylaxis    IGE mediated  . Anxiety   . Arthritis   . Cardiomyopathy ([place])   . CHF (congestive heart failure) ([place])   . COPD (chronic obstructive pulmonary disease) ([place])   . Coronary atherosclerosis of native coronary artery    Multivessel status post CABG 2010  . Depression   . Essential hypertension, benign   . History of CVA (cerebrovascular accident) 01/2012   Right posterior frontal cortical and subcortical brain by MRI, no hemorrhage. Carotid Dopplers showed only 1-50% bilateral ICA stenoses. Echocardiogram showed LVEF 50%, no major valvular abnormalities.  . Mixed hyperlipidemia   . Myocardial infarction ([place]) 2010  . OSA (obstructive sleep apnea)   . Stroke ([place])   . Type 2 diabetes mellitus (HCC)    There were no vitals taken for this visit.  Opioid Risk Score:   Fall Risk Score:  `1  Depression screen PHQ 2/9  Depression screen PHQ 2/9 09/29/2017  Decreased Interest 1  Down, Depressed, Hopeless 3  PHQ - 2 Score 4  Altered sleeping 3  Tired, decreased energy 2  Change in appetite 3  Feeling bad or failure about yourself  3  Trouble concentrating 3  Moving slowly or fidgety/restless 2  Suicidal thoughts 0  PHQ-9 Score 20  Difficult doing work/chores Somewhat difficult     Review of Systems  Constitutional: Negative.   HENT: Negative.   Eyes: Negative.     Respiratory: Negative.   Cardiovascular: Negative.   Gastrointestinal: Negative.   Endocrine: Negative.   Genitourinary: Negative.   Musculoskeletal: Positive for arthralgias, gait problem and myalgias.  Skin: Negative.   Allergic/Immunologic: Negative.   Neurological: Positive for tremors, weakness and numbness.  Hematological: Negative.   Psychiatric/Behavioral: Positive for dysphoric mood. The patient is nervous/anxious.   All other systems reviewed and are negative.      Objective:   Physical Exam  Constitutional: He is oriented to person, place, and time. He appears well-developed and well-nourished.  HENT:  Head: Normocephalic and atraumatic.  Eyes: Pupils are equal, round, and reactive to light. EOM are normal.  Neurological: He is alert and oriented to person, place, and time.  Skin: Skin is warm and dry.  Psychiatric: He has a normal mood and affect.  Nursing note and vitals reviewed.    Motor strength to minus at the deltoid and bicep in the left upper limb Left lower limb 3- hip flexion 4- knee extension 0 at the ankle dorsiflexor plantar flexor Tone MAS 3 at the right bicep MAS 2 at the finger flexors and wrist flexor, MAS 3 at the left hamstring mainly medial aspect  Patient did not have his quad cane with him therefore could not assess ambulation.  He does use a double metal upright AFO      Assessment & Plan:  1.  History of right MCA infarct 2013 with chronic left spastic hemi-parous is that interferes with mobility as well as self-care as well as positioning of limb.  Given that he already received maximum dose of Botox without adequate response in the lower limb and additional need for upper limb elbow flexor treatment, will switch to Dysport 1500 units with the following dosage patterns  LUE FDS 100 FDP 100 FPL 100 Biceps 100 Brachiorad 100  Medial Hamstrings 500  Med gastroc 100 Lat Gastro 100 Med soleus 100 Lat soleus 100 Tibialis post  100

## 2018-02-24 NOTE — Patient Instructions (Signed)
Will change to Dysport AbobotulinumtoxinA injection What is this medicine? ABOBOTULINUMTOXINA (ay boh BOT yoo li num TOX in A) is a neuro-muscular blocker. This medicine is used to treat severe neck muscle spasms. It is also used to treat muscle spasms in the elbow, wrist, and finger muscles in adults and in the calf muscles in children. It is also used to treat frown lines or lines between the eyebrows on the face. This medicine may be used for other purposes; ask your health care provider or pharmacist if you have questions. COMMON BRAND NAME(S): Dysport What should I tell my health care provider before I take this medicine? They need to know if you have any of these conditions: -breathing problems -diabetes -heart problems -history of surgery where this medicine is going to be used -infection where this medicine is going to be used -myasthenia gravis or other neurologic disease -nerve or muscle disease -surgery plans -an unusual or allergic reaction to botulinum toxin, albumin, cow's milk protein, other medicines, foods, dyes, or preservatives -pregnant or trying to get pregnant -breast-feeding How should I use this medicine? This medicine is for injection into a muscle. It is given by a health care professional in a hospital or clinic setting. A special MedGuide will be given to you with each prescription and refill. Be sure to read this information carefully each time. Talk to your pediatrician regarding the use of this medicine in children. While this drug may be prescribed for children as young as 2 years for selected conditions, precautions to apply. Overdosage: If you think you have taken too much of this medicine contact a poison control center or emergency room at once. NOTE: This medicine is only for you. Do not share this medicine with others. What if I miss a dose? This does not apply. What may interact with this medicine? -aminoglycoside antibiotics like gentamicin,  neomycin, tobramycin -muscle relaxants -other botulinum toxin injections This list may not describe all possible interactions. Give your health care provider a list of all the medicines, herbs, non-prescription drugs, or dietary supplements you use. Also tell them if you smoke, drink alcohol, or use illegal drugs. Some items may interact with your medicine. What should I watch for while using this medicine? Visit your doctor for regular check ups. This medicine will cause weakness in the muscle where it is injected. Tell your doctor if you feel unusually weak in other muscles. Get medical help right away if you have problems with breathing, swallowing, or talking. This medicine contains albumin from human blood. It may be possible to pass an infection in this medicine, but no cases have been reported. Talk to your doctor about the risks and benefits of this medicine. If your activities have been limited by your condition, go back to your regular routine slowly after treatment with this medicine. This medicine can make your muscles weak. And, this medicine can make your eyelids droop or make you see blurry or double. If you have weak muscles or trouble seeing do not drive a car, use machinery, or do other dangerous activities. What side effects may I notice from receiving this medicine? Side effects that you should report to your doctor or health care professional as soon as possible: -allergic reactions like skin rash, itching or hives, swelling of the face, lips, or tongue -breathing problems -changes in vision -chest pain or tightness -eye swelling or drooping of the eyelids -fast, irregular heartbeat -loss of bladder control -numbness or weakness -signs and symptoms of infection  like fever or chills; cough; flu-like symptoms; sore throat -numbness or weakness -speech problems -swallowing problems Side effects that usually do not require medical attention (report to your doctor or health  care professional if they continue or are bothersome): -bruising or pain at site where injected -dizziness -dry eyes or eye irritation -dry mouth -headache -runny nose This list may not describe all possible side effects. Call your doctor for medical advice about side effects. You may report side effects to FDA at 1-800-FDA-1088. Where should I keep my medicine? This drug is given in a hospital or clinic and will not be stored at home. NOTE: This sheet is a summary. It may not cover all possible information. If you have questions about this medicine, talk to your doctor, pharmacist, or health care provider.  2018 Elsevier/Gold Standard (2015-02-15 14:54:52)

## 2018-02-25 DIAGNOSIS — I69354 Hemiplegia and hemiparesis following cerebral infarction affecting left non-dominant side: Secondary | ICD-10-CM | POA: Diagnosis not present

## 2018-02-25 DIAGNOSIS — I255 Ischemic cardiomyopathy: Secondary | ICD-10-CM | POA: Diagnosis not present

## 2018-02-25 DIAGNOSIS — E119 Type 2 diabetes mellitus without complications: Secondary | ICD-10-CM | POA: Diagnosis not present

## 2018-02-25 DIAGNOSIS — I2510 Atherosclerotic heart disease of native coronary artery without angina pectoris: Secondary | ICD-10-CM | POA: Diagnosis not present

## 2018-02-26 DIAGNOSIS — E119 Type 2 diabetes mellitus without complications: Secondary | ICD-10-CM | POA: Diagnosis not present

## 2018-02-26 DIAGNOSIS — I69354 Hemiplegia and hemiparesis following cerebral infarction affecting left non-dominant side: Secondary | ICD-10-CM | POA: Diagnosis not present

## 2018-02-26 DIAGNOSIS — I255 Ischemic cardiomyopathy: Secondary | ICD-10-CM | POA: Diagnosis not present

## 2018-02-26 DIAGNOSIS — I2510 Atherosclerotic heart disease of native coronary artery without angina pectoris: Secondary | ICD-10-CM | POA: Diagnosis not present

## 2018-02-27 DIAGNOSIS — I6930 Unspecified sequelae of cerebral infarction: Secondary | ICD-10-CM | POA: Diagnosis not present

## 2018-02-27 DIAGNOSIS — R262 Difficulty in walking, not elsewhere classified: Secondary | ICD-10-CM | POA: Diagnosis not present

## 2018-02-27 DIAGNOSIS — I255 Ischemic cardiomyopathy: Secondary | ICD-10-CM | POA: Diagnosis not present

## 2018-02-27 DIAGNOSIS — E119 Type 2 diabetes mellitus without complications: Secondary | ICD-10-CM | POA: Diagnosis not present

## 2018-02-27 DIAGNOSIS — I2510 Atherosclerotic heart disease of native coronary artery without angina pectoris: Secondary | ICD-10-CM | POA: Diagnosis not present

## 2018-02-27 DIAGNOSIS — I69354 Hemiplegia and hemiparesis following cerebral infarction affecting left non-dominant side: Secondary | ICD-10-CM | POA: Diagnosis not present

## 2018-02-27 DIAGNOSIS — G8102 Flaccid hemiplegia affecting left dominant side: Secondary | ICD-10-CM | POA: Diagnosis not present

## 2018-03-02 DIAGNOSIS — I2510 Atherosclerotic heart disease of native coronary artery without angina pectoris: Secondary | ICD-10-CM | POA: Diagnosis not present

## 2018-03-02 DIAGNOSIS — I255 Ischemic cardiomyopathy: Secondary | ICD-10-CM | POA: Diagnosis not present

## 2018-03-02 DIAGNOSIS — E119 Type 2 diabetes mellitus without complications: Secondary | ICD-10-CM | POA: Diagnosis not present

## 2018-03-02 DIAGNOSIS — I69354 Hemiplegia and hemiparesis following cerebral infarction affecting left non-dominant side: Secondary | ICD-10-CM | POA: Diagnosis not present

## 2018-03-03 DIAGNOSIS — I255 Ischemic cardiomyopathy: Secondary | ICD-10-CM | POA: Diagnosis not present

## 2018-03-03 DIAGNOSIS — E119 Type 2 diabetes mellitus without complications: Secondary | ICD-10-CM | POA: Diagnosis not present

## 2018-03-03 DIAGNOSIS — I2510 Atherosclerotic heart disease of native coronary artery without angina pectoris: Secondary | ICD-10-CM | POA: Diagnosis not present

## 2018-03-03 DIAGNOSIS — I69354 Hemiplegia and hemiparesis following cerebral infarction affecting left non-dominant side: Secondary | ICD-10-CM | POA: Diagnosis not present

## 2018-03-03 NOTE — Progress Notes (Signed)
Cardiology Office Note  Date: 03/04/2018   ID: [name], DOB [birth year], MRN [id number]  PCP: [name] Bis, MD  Primary Cardiologist: [name] Lesches, MD   Chief Complaint  Patient presents with  . Coronary Artery Disease  . Cardiomyopathy    History of Present Illness: [name] is a medically complex 52 y.o. male last seen in May.  He is here today for a follow-up visit.  He tells me that he has continued to do physical therapy, has been able to walk more with improving left leg weakness, still has significant left arm weakness.  He does not report any angina symptoms or nitroglycerin use, no palpitations or syncope.  He is in a wheelchair today.  I reviewed his medications and we discussed reducing his aspirin to 81 mg daily at this point, otherwise continue with current cardiac regimen which includes Plavix, Coreg, Lipitor, Lasix, Imdur, and Entresto.  He is due for a follow-up visit with lab work per Dr. Quillian [name] in early September.  Past Medical History:  Diagnosis Date  . Anaphylaxis    IGE mediated  . Anxiety   . Arthritis   . Cardiomyopathy ([place])   . CHF (congestive heart failure) ([place])   . COPD (chronic obstructive pulmonary disease) ([place])   . Coronary atherosclerosis of native coronary artery    Multivessel status post CABG 2010  . Depression   . Essential hypertension, benign   . History of CVA (cerebrovascular accident) 01/2012   Right posterior frontal cortical and subcortical brain by MRI, no hemorrhage. Carotid Dopplers showed only 1-50% bilateral ICA stenoses. Echocardiogram showed LVEF 50%, no major valvular abnormalities.  . Mixed hyperlipidemia   . Myocardial infarction ([name]) 2010  . OSA (obstructive sleep apnea)   . Stroke ([name])   . Type 2 diabetes mellitus ([name])     Past Surgical History:  Procedure Laterality Date  . CHOLECYSTECTOMY    . CORONARY ARTERY BYPASS GRAFT  2010   LIMA to LAD, SVG to diagonal, SVG to OM1 and  OM 2, SVG to RCA  . DENTAL SURGERY  2003  . GASTRIC BYPASS  2010  . HERNIA REPAIR  2011, 2012  . INCISIONAL HERNIA REPAIR N/A 07/23/2013   Procedure: HERNIA REPAIR INCISIONAL WITH MESH;  Surgeon: [name] So, MD;  Location: [facility];  Service: General;  Laterality: N/A;  . INSERTION OF MESH N/A 07/23/2013   Procedure: INSERTION OF MESH;  Surgeon: [name] So, MD;  Location: [facility];  Service: General;  Laterality: N/A;  . LEFT HEART CATHETERIZATION WITH CORONARY/GRAFT ANGIOGRAM N/A 11/01/2013   Procedure: LEFT HEART CATHETERIZATION WITH [name];  Surgeon: [name] Ohara, MD;  Location: [facility] CATH LAB;  Service: Cardiovascular;  Laterality: N/A;  . TOE AMPUTATION  1998   right 1st and 2nd toe  . TONSILECTOMY, ADENOIDECTOMY, BILATERAL MYRINGOTOMY AND TUBES    . TONSILLECTOMY    . VENTRAL HERNIA REPAIR N/A 10/28/2012   Procedure: LAPAROSCOPIC VENTRAL HERNIA;  Surgeon: [name] Heinz, MD;  Location: [facility];  Service: General;  Laterality: N/A;    Current Outpatient Medications  Medication Sig Dispense Refill  . albuterol (PROVENTIL HFA;VENTOLIN HFA) 108 (90 BASE) MCG/ACT inhaler Inhale 2 puffs into the lungs every 6 (six) hours as needed for wheezing or shortness of breath.    . ALPRAZolam (XANAX) 1 MG tablet Take 1 mg by mouth 2 (two) times daily.     [name] atorvastatin (LIPITOR) 40 MG tablet Take 1 tablet  by mouth daily.    . baclofen (LIORESAL) 10 MG tablet Take 1 tablet (10 mg total) by mouth 3 (three) times daily as needed for muscle spasms. 90 each 1  . carvedilol (COREG) 6.25 MG tablet Take 6.25 mg by mouth 2 (two) times daily with a meal.    . Cholecalciferol (VITAMIN D) 2000 units tablet Take 2,000 Units by mouth daily at 12 noon.    . clopidogrel (PLAVIX) 75 MG tablet Take 1 tablet by mouth daily.    . DULoxetine (CYMBALTA) 60 MG capsule Take 60 mg by mouth daily.    . fenofibrate micronized (LOFIBRA) 134 MG capsule Take 134 mg by mouth daily before breakfast.     .  fluticasone (CUTIVATE) 0.05 % cream Apply 1 application topically 2 (two) times daily.    . furosemide (LASIX) 20 MG tablet Take 20 mg by mouth.    . gabapentin (NEURONTIN) 600 MG tablet Take 1 tablet by mouth 3 (three) times daily.    . isosorbide mononitrate (IMDUR) 60 MG 24 hr tablet Take 60 mg by mouth 2 (two) times daily.     . nitroGLYCERIN (NITROSTAT) 0.4 MG SL tablet Place 0.4 mg under the tongue every 5 (five) minutes as needed. For chest pain    . oxyCODONE (OXY IR/ROXICODONE) 5 MG immediate release tablet Take 5 mg by mouth every 8 (eight) hours as needed for severe pain.    . pantoprazole (PROTONIX) 40 MG tablet Take 40 mg by mouth daily.    [name] PARoxetine (PAXIL) 20 MG tablet Take 40 mg by mouth daily.     . phentermine 37.5 MG capsule Take 1 capsule by mouth daily.    . sacubitril-valsartan (ENTRESTO) 49-51 MG TAKE (1) TABLET TWICE DAILY. 60 tablet 3  . vitamin B-12 (CYANOCOBALAMIN) 1000 MCG tablet Take 1,000 mcg by mouth daily at 12 noon.    [name] aspirin EC 81 MG tablet Take 1 tablet (81 mg total) by mouth daily. 90 tablet 3   No current facility-administered medications for this visit.    Allergies:  Contrast media [iodinated diagnostic agents]; Ibuprofen; and Nsaids   Social History: The patient  reports that he has been smoking cigarettes. He started smoking about 35 years ago. He has a 45.00 pack-year smoking history. He has never used smokeless tobacco. He reports that he does not drink alcohol or use drugs.   ROS:  Please see the history of present illness. Otherwise, complete review of systems is positive for left-sided weakness.  All other systems are reviewed and negative.   Physical Exam: VS:  BP 105/63   Pulse 74   Ht 5\' 11"  (1.803 m)   Wt 283 lb 3.2 oz (128.5 kg)   SpO2 98%   BMI 39.50 kg/m , BMI Body mass index is 39.5 kg/m.  Wt Readings from Last 3 Encounters:  03/04/18 283 lb 3.2 oz (128.5 kg)  02/24/18 287 lb (130.2 kg)  02/09/18 279 lb (126.6 kg)      General: Obese male, seated in wheelchair. HEENT: Conjunctiva and lids normal, oropharynx clear. Neck: Supple, no elevated JVP or carotid bruits, no thyromegaly. Lungs: Clear to auscultation, nonlabored breathing at rest. Cardiac: Regular rate and rhythm, no S3 or significant systolic murmur. Abdomen: Soft, nontender, bowel sounds present. Extremities: Mild lower extremity edema, brace on left leg. Skin: Warm and dry. Musculoskeletal: No kyphosis. Neuropsychiatric: Alert and oriented x3, affect grossly appropriate.  ECG: I personally reviewed the tracing from 07/28/2017 which showed sinus rhythm with  vertical axis, anterolateral infarct pattern, nonspecific ST changes.  Recent Labwork: 07/28/2017: B Natriuretic Peptide 138.0; BUN 22; Creatinine, Ser 1.09; Hemoglobin 14.9; Platelets 254; Potassium 3.3; Sodium 139  May 2019: BUN 22, creatinine 1.38, potassium 4.3, AST 20, ALT 18, cholesterol 153, triglycerides 194, HDL 27, LDL 87, hemoglobin A1c 5.7, TSH 2.25  Other Studies Reviewed Today:  Echocardiogram 10/30/2017: Study Conclusions  - Left ventricle: The cavity size was mildly dilated. Wall   thickness was increased in a pattern of mild LVH. The estimated   ejection fraction was approximately 40% based on limited images.   Indeterminate diastolic function. There is akinesis of the   anteroseptal myocardium. - Aortic valve: Mildly calcified annulus. Trileaflet. There was   mild regurgitation. - Mitral valve: Mildly calcified annulus. There was trivial   regurgitation. - Right ventricle: Poorly visualized. - Atrial septum: No defect or patent foramen ovale was identified. - Tricuspid valve: There was trivial regurgitation. - Pulmonary arteries: Systolic pressure could not be accurately   estimated. - Pericardium, extracardiac: There was no pericardial effusion.  Assessment and Plan:  1.  Multivessel CAD status post CABG with patent bypass grafts as of 2015 and follow-up Myoview  at [facility] more recently indicating large anterior and periapical scar without active ischemia.  He reports no angina symptoms on medical therapy.  Reduce aspirin to 81 mg daily and otherwise continue present regimen.  2.  Ischemic cardiomyopathy with improvement in LVEF to 40% based on echocardiogram in April.  He is tolerating current regimen well.  3.  History of stroke with left-sided hemiparesis.  He continues to undergo rehabilitation.  4.  Mixed hyperlipidemia on Lipitor.  LDL 87 in May.  Current medicines were reviewed with the patient today.  Disposition: Follow-up in 4 months.  Signed, Satira Sark, MD, [facility] 03/04/2018 1:54 PM    [facility] [address], [place],  [address] Phone: [phone]; Fax: [phone]

## 2018-03-04 ENCOUNTER — Ambulatory Visit (INDEPENDENT_AMBULATORY_CARE_PROVIDER_SITE_OTHER): Payer: Medicare HMO | Admitting: Cardiology

## 2018-03-04 ENCOUNTER — Encounter: Payer: Self-pay | Admitting: Cardiology

## 2018-03-04 VITALS — BP 105/63 | HR 74 | Ht 71.0 in | Wt 283.2 lb

## 2018-03-04 DIAGNOSIS — R262 Difficulty in walking, not elsewhere classified: Secondary | ICD-10-CM | POA: Diagnosis not present

## 2018-03-04 DIAGNOSIS — I25119 Atherosclerotic heart disease of native coronary artery with unspecified angina pectoris: Secondary | ICD-10-CM

## 2018-03-04 DIAGNOSIS — I69354 Hemiplegia and hemiparesis following cerebral infarction affecting left non-dominant side: Secondary | ICD-10-CM | POA: Diagnosis not present

## 2018-03-04 DIAGNOSIS — Z8673 Personal history of transient ischemic attack (TIA), and cerebral infarction without residual deficits: Secondary | ICD-10-CM | POA: Diagnosis not present

## 2018-03-04 DIAGNOSIS — G8102 Flaccid hemiplegia affecting left dominant side: Secondary | ICD-10-CM | POA: Diagnosis not present

## 2018-03-04 DIAGNOSIS — E782 Mixed hyperlipidemia: Secondary | ICD-10-CM

## 2018-03-04 DIAGNOSIS — I69398 Other sequelae of cerebral infarction: Secondary | ICD-10-CM | POA: Diagnosis not present

## 2018-03-04 DIAGNOSIS — E119 Type 2 diabetes mellitus without complications: Secondary | ICD-10-CM | POA: Diagnosis not present

## 2018-03-04 DIAGNOSIS — I255 Ischemic cardiomyopathy: Secondary | ICD-10-CM

## 2018-03-04 DIAGNOSIS — I2510 Atherosclerotic heart disease of native coronary artery without angina pectoris: Secondary | ICD-10-CM | POA: Diagnosis not present

## 2018-03-04 DIAGNOSIS — I6930 Unspecified sequelae of cerebral infarction: Secondary | ICD-10-CM | POA: Diagnosis not present

## 2018-03-04 MED ORDER — ASPIRIN EC 81 MG PO TBEC: 81.0000 mg | DELAYED_RELEASE_TABLET | Freq: Every day | ORAL | 3 refills | Status: DC

## 2018-03-04 NOTE — Patient Instructions (Addendum)
Medication Instructions:  Your physician has recommended you make the following change in your medication:    DECREASE Aspirin to 81 mg daily   Please continue all other medications as prescribed.   Labwork: NONE  Testing/Procedures: NONE  Follow-Up: Your physician wants you to follow-up in: [place]. [place]. You will receive a reminder letter in the mail two months in advance. If you don't receive a letter, please call our office to schedule the follow-up appointment.  Any Other Special Instructions Will Be Listed Below (If Applicable).  If you need a refill on your cardiac medications before your next appointment, please call your pharmacy.

## 2018-03-05 DIAGNOSIS — E119 Type 2 diabetes mellitus without complications: Secondary | ICD-10-CM | POA: Diagnosis not present

## 2018-03-05 DIAGNOSIS — I2510 Atherosclerotic heart disease of native coronary artery without angina pectoris: Secondary | ICD-10-CM | POA: Diagnosis not present

## 2018-03-05 DIAGNOSIS — I69354 Hemiplegia and hemiparesis following cerebral infarction affecting left non-dominant side: Secondary | ICD-10-CM | POA: Diagnosis not present

## 2018-03-05 DIAGNOSIS — I255 Ischemic cardiomyopathy: Secondary | ICD-10-CM | POA: Diagnosis not present

## 2018-03-06 ENCOUNTER — Other Ambulatory Visit: Payer: Self-pay | Admitting: *Deleted

## 2018-03-06 DIAGNOSIS — G8102 Flaccid hemiplegia affecting left dominant side: Secondary | ICD-10-CM | POA: Diagnosis not present

## 2018-03-06 DIAGNOSIS — I2510 Atherosclerotic heart disease of native coronary artery without angina pectoris: Secondary | ICD-10-CM | POA: Diagnosis not present

## 2018-03-06 DIAGNOSIS — I69354 Hemiplegia and hemiparesis following cerebral infarction affecting left non-dominant side: Secondary | ICD-10-CM | POA: Diagnosis not present

## 2018-03-06 DIAGNOSIS — I255 Ischemic cardiomyopathy: Secondary | ICD-10-CM | POA: Diagnosis not present

## 2018-03-06 DIAGNOSIS — R262 Difficulty in walking, not elsewhere classified: Secondary | ICD-10-CM | POA: Diagnosis not present

## 2018-03-06 DIAGNOSIS — E119 Type 2 diabetes mellitus without complications: Secondary | ICD-10-CM | POA: Diagnosis not present

## 2018-03-06 DIAGNOSIS — I6930 Unspecified sequelae of cerebral infarction: Secondary | ICD-10-CM | POA: Diagnosis not present

## 2018-03-06 MED ORDER — SACUBITRIL-VALSARTAN 49-51 MG PO TABS: ORAL_TABLET | ORAL | 6 refills | Status: DC

## 2018-03-09 DIAGNOSIS — I255 Ischemic cardiomyopathy: Secondary | ICD-10-CM | POA: Diagnosis not present

## 2018-03-09 DIAGNOSIS — F1120 Opioid dependence, uncomplicated: Secondary | ICD-10-CM | POA: Diagnosis not present

## 2018-03-09 DIAGNOSIS — M545 Low back pain: Secondary | ICD-10-CM | POA: Diagnosis not present

## 2018-03-09 DIAGNOSIS — I639 Cerebral infarction, unspecified: Secondary | ICD-10-CM | POA: Diagnosis not present

## 2018-03-09 DIAGNOSIS — I2510 Atherosclerotic heart disease of native coronary artery without angina pectoris: Secondary | ICD-10-CM | POA: Diagnosis not present

## 2018-03-09 DIAGNOSIS — G894 Chronic pain syndrome: Secondary | ICD-10-CM | POA: Diagnosis not present

## 2018-03-09 DIAGNOSIS — E119 Type 2 diabetes mellitus without complications: Secondary | ICD-10-CM | POA: Diagnosis not present

## 2018-03-09 DIAGNOSIS — Z79899 Other long term (current) drug therapy: Secondary | ICD-10-CM | POA: Diagnosis not present

## 2018-03-09 DIAGNOSIS — I69354 Hemiplegia and hemiparesis following cerebral infarction affecting left non-dominant side: Secondary | ICD-10-CM | POA: Diagnosis not present

## 2018-03-10 DIAGNOSIS — E119 Type 2 diabetes mellitus without complications: Secondary | ICD-10-CM | POA: Diagnosis not present

## 2018-03-10 DIAGNOSIS — I6930 Unspecified sequelae of cerebral infarction: Secondary | ICD-10-CM | POA: Diagnosis not present

## 2018-03-10 DIAGNOSIS — I69354 Hemiplegia and hemiparesis following cerebral infarction affecting left non-dominant side: Secondary | ICD-10-CM | POA: Diagnosis not present

## 2018-03-10 DIAGNOSIS — I255 Ischemic cardiomyopathy: Secondary | ICD-10-CM | POA: Diagnosis not present

## 2018-03-10 DIAGNOSIS — I2510 Atherosclerotic heart disease of native coronary artery without angina pectoris: Secondary | ICD-10-CM | POA: Diagnosis not present

## 2018-03-10 DIAGNOSIS — I69398 Other sequelae of cerebral infarction: Secondary | ICD-10-CM | POA: Diagnosis not present

## 2018-03-10 DIAGNOSIS — J449 Chronic obstructive pulmonary disease, unspecified: Secondary | ICD-10-CM | POA: Diagnosis not present

## 2018-03-10 DIAGNOSIS — R262 Difficulty in walking, not elsewhere classified: Secondary | ICD-10-CM | POA: Diagnosis not present

## 2018-03-10 DIAGNOSIS — G8102 Flaccid hemiplegia affecting left dominant side: Secondary | ICD-10-CM | POA: Diagnosis not present

## 2018-03-11 DIAGNOSIS — R262 Difficulty in walking, not elsewhere classified: Secondary | ICD-10-CM | POA: Diagnosis not present

## 2018-03-11 DIAGNOSIS — I2510 Atherosclerotic heart disease of native coronary artery without angina pectoris: Secondary | ICD-10-CM | POA: Diagnosis not present

## 2018-03-11 DIAGNOSIS — I6930 Unspecified sequelae of cerebral infarction: Secondary | ICD-10-CM | POA: Diagnosis not present

## 2018-03-11 DIAGNOSIS — E119 Type 2 diabetes mellitus without complications: Secondary | ICD-10-CM | POA: Diagnosis not present

## 2018-03-11 DIAGNOSIS — I69354 Hemiplegia and hemiparesis following cerebral infarction affecting left non-dominant side: Secondary | ICD-10-CM | POA: Diagnosis not present

## 2018-03-11 DIAGNOSIS — I255 Ischemic cardiomyopathy: Secondary | ICD-10-CM | POA: Diagnosis not present

## 2018-03-11 DIAGNOSIS — G8102 Flaccid hemiplegia affecting left dominant side: Secondary | ICD-10-CM | POA: Diagnosis not present

## 2018-03-12 DIAGNOSIS — I255 Ischemic cardiomyopathy: Secondary | ICD-10-CM | POA: Diagnosis not present

## 2018-03-12 DIAGNOSIS — G8102 Flaccid hemiplegia affecting left dominant side: Secondary | ICD-10-CM | POA: Diagnosis not present

## 2018-03-12 DIAGNOSIS — I69354 Hemiplegia and hemiparesis following cerebral infarction affecting left non-dominant side: Secondary | ICD-10-CM | POA: Diagnosis not present

## 2018-03-12 DIAGNOSIS — E119 Type 2 diabetes mellitus without complications: Secondary | ICD-10-CM | POA: Diagnosis not present

## 2018-03-12 DIAGNOSIS — I6930 Unspecified sequelae of cerebral infarction: Secondary | ICD-10-CM | POA: Diagnosis not present

## 2018-03-12 DIAGNOSIS — R262 Difficulty in walking, not elsewhere classified: Secondary | ICD-10-CM | POA: Diagnosis not present

## 2018-03-12 DIAGNOSIS — I2510 Atherosclerotic heart disease of native coronary artery without angina pectoris: Secondary | ICD-10-CM | POA: Diagnosis not present

## 2018-03-13 DIAGNOSIS — I2510 Atherosclerotic heart disease of native coronary artery without angina pectoris: Secondary | ICD-10-CM | POA: Diagnosis not present

## 2018-03-13 DIAGNOSIS — I6930 Unspecified sequelae of cerebral infarction: Secondary | ICD-10-CM | POA: Diagnosis not present

## 2018-03-13 DIAGNOSIS — G8102 Flaccid hemiplegia affecting left dominant side: Secondary | ICD-10-CM | POA: Diagnosis not present

## 2018-03-13 DIAGNOSIS — R262 Difficulty in walking, not elsewhere classified: Secondary | ICD-10-CM | POA: Diagnosis not present

## 2018-03-13 DIAGNOSIS — I69354 Hemiplegia and hemiparesis following cerebral infarction affecting left non-dominant side: Secondary | ICD-10-CM | POA: Diagnosis not present

## 2018-03-13 DIAGNOSIS — I255 Ischemic cardiomyopathy: Secondary | ICD-10-CM | POA: Diagnosis not present

## 2018-03-13 DIAGNOSIS — E119 Type 2 diabetes mellitus without complications: Secondary | ICD-10-CM | POA: Diagnosis not present

## 2018-03-16 DIAGNOSIS — I2510 Atherosclerotic heart disease of native coronary artery without angina pectoris: Secondary | ICD-10-CM | POA: Diagnosis not present

## 2018-03-16 DIAGNOSIS — I255 Ischemic cardiomyopathy: Secondary | ICD-10-CM | POA: Diagnosis not present

## 2018-03-16 DIAGNOSIS — E119 Type 2 diabetes mellitus without complications: Secondary | ICD-10-CM | POA: Diagnosis not present

## 2018-03-16 DIAGNOSIS — I69354 Hemiplegia and hemiparesis following cerebral infarction affecting left non-dominant side: Secondary | ICD-10-CM | POA: Diagnosis not present

## 2018-03-17 DIAGNOSIS — I69354 Hemiplegia and hemiparesis following cerebral infarction affecting left non-dominant side: Secondary | ICD-10-CM | POA: Diagnosis not present

## 2018-03-17 DIAGNOSIS — I255 Ischemic cardiomyopathy: Secondary | ICD-10-CM | POA: Diagnosis not present

## 2018-03-17 DIAGNOSIS — G4733 Obstructive sleep apnea (adult) (pediatric): Secondary | ICD-10-CM | POA: Diagnosis not present

## 2018-03-17 DIAGNOSIS — I2510 Atherosclerotic heart disease of native coronary artery without angina pectoris: Secondary | ICD-10-CM | POA: Diagnosis not present

## 2018-03-17 DIAGNOSIS — E119 Type 2 diabetes mellitus without complications: Secondary | ICD-10-CM | POA: Diagnosis not present

## 2018-03-17 DIAGNOSIS — L089 Local infection of the skin and subcutaneous tissue, unspecified: Secondary | ICD-10-CM | POA: Diagnosis not present

## 2018-03-17 DIAGNOSIS — J40 Bronchitis, not specified as acute or chronic: Secondary | ICD-10-CM | POA: Diagnosis not present

## 2018-03-17 DIAGNOSIS — L0390 Cellulitis, unspecified: Secondary | ICD-10-CM | POA: Diagnosis not present

## 2018-03-17 DIAGNOSIS — I87302 Chronic venous hypertension (idiopathic) without complications of left lower extremity: Secondary | ICD-10-CM | POA: Diagnosis not present

## 2018-03-18 DIAGNOSIS — I2510 Atherosclerotic heart disease of native coronary artery without angina pectoris: Secondary | ICD-10-CM | POA: Diagnosis not present

## 2018-03-18 DIAGNOSIS — F17200 Nicotine dependence, unspecified, uncomplicated: Secondary | ICD-10-CM | POA: Diagnosis not present

## 2018-03-18 DIAGNOSIS — G4733 Obstructive sleep apnea (adult) (pediatric): Secondary | ICD-10-CM | POA: Diagnosis not present

## 2018-03-18 DIAGNOSIS — F329 Major depressive disorder, single episode, unspecified: Secondary | ICD-10-CM | POA: Diagnosis not present

## 2018-03-18 DIAGNOSIS — F39 Unspecified mood [affective] disorder: Secondary | ICD-10-CM | POA: Diagnosis not present

## 2018-03-18 DIAGNOSIS — E119 Type 2 diabetes mellitus without complications: Secondary | ICD-10-CM | POA: Diagnosis not present

## 2018-03-18 DIAGNOSIS — F419 Anxiety disorder, unspecified: Secondary | ICD-10-CM | POA: Diagnosis not present

## 2018-03-18 DIAGNOSIS — I255 Ischemic cardiomyopathy: Secondary | ICD-10-CM | POA: Diagnosis not present

## 2018-03-18 DIAGNOSIS — Z87891 Personal history of nicotine dependence: Secondary | ICD-10-CM | POA: Diagnosis not present

## 2018-03-18 DIAGNOSIS — I69354 Hemiplegia and hemiparesis following cerebral infarction affecting left non-dominant side: Secondary | ICD-10-CM | POA: Diagnosis not present

## 2018-03-19 DIAGNOSIS — I255 Ischemic cardiomyopathy: Secondary | ICD-10-CM | POA: Diagnosis not present

## 2018-03-19 DIAGNOSIS — I2510 Atherosclerotic heart disease of native coronary artery without angina pectoris: Secondary | ICD-10-CM | POA: Diagnosis not present

## 2018-03-19 DIAGNOSIS — I69354 Hemiplegia and hemiparesis following cerebral infarction affecting left non-dominant side: Secondary | ICD-10-CM | POA: Diagnosis not present

## 2018-03-19 DIAGNOSIS — E119 Type 2 diabetes mellitus without complications: Secondary | ICD-10-CM | POA: Diagnosis not present

## 2018-03-20 DIAGNOSIS — E119 Type 2 diabetes mellitus without complications: Secondary | ICD-10-CM | POA: Diagnosis not present

## 2018-03-20 DIAGNOSIS — I2510 Atherosclerotic heart disease of native coronary artery without angina pectoris: Secondary | ICD-10-CM | POA: Diagnosis not present

## 2018-03-20 DIAGNOSIS — I69354 Hemiplegia and hemiparesis following cerebral infarction affecting left non-dominant side: Secondary | ICD-10-CM | POA: Diagnosis not present

## 2018-03-20 DIAGNOSIS — I255 Ischemic cardiomyopathy: Secondary | ICD-10-CM | POA: Diagnosis not present

## 2018-03-23 DIAGNOSIS — I255 Ischemic cardiomyopathy: Secondary | ICD-10-CM | POA: Diagnosis not present

## 2018-03-23 DIAGNOSIS — I69354 Hemiplegia and hemiparesis following cerebral infarction affecting left non-dominant side: Secondary | ICD-10-CM | POA: Diagnosis not present

## 2018-03-23 DIAGNOSIS — I2510 Atherosclerotic heart disease of native coronary artery without angina pectoris: Secondary | ICD-10-CM | POA: Diagnosis not present

## 2018-03-23 DIAGNOSIS — E119 Type 2 diabetes mellitus without complications: Secondary | ICD-10-CM | POA: Diagnosis not present

## 2018-03-24 ENCOUNTER — Encounter: Payer: Self-pay | Admitting: Psychology

## 2018-03-24 ENCOUNTER — Ambulatory Visit (INDEPENDENT_AMBULATORY_CARE_PROVIDER_SITE_OTHER): Payer: Medicare HMO | Admitting: Psychology

## 2018-03-24 DIAGNOSIS — R413 Other amnesia: Secondary | ICD-10-CM | POA: Diagnosis not present

## 2018-03-24 DIAGNOSIS — I69354 Hemiplegia and hemiparesis following cerebral infarction affecting left non-dominant side: Secondary | ICD-10-CM | POA: Diagnosis not present

## 2018-03-24 DIAGNOSIS — E119 Type 2 diabetes mellitus without complications: Secondary | ICD-10-CM | POA: Diagnosis not present

## 2018-03-24 DIAGNOSIS — I679 Cerebrovascular disease, unspecified: Secondary | ICD-10-CM

## 2018-03-24 DIAGNOSIS — I2510 Atherosclerotic heart disease of native coronary artery without angina pectoris: Secondary | ICD-10-CM | POA: Diagnosis not present

## 2018-03-24 DIAGNOSIS — I255 Ischemic cardiomyopathy: Secondary | ICD-10-CM | POA: Diagnosis not present

## 2018-03-24 DIAGNOSIS — I639 Cerebral infarction, unspecified: Secondary | ICD-10-CM
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
## 2018-03-25 DIAGNOSIS — E119 Type 2 diabetes mellitus without complications: Secondary | ICD-10-CM | POA: Diagnosis not present

## 2018-03-25 DIAGNOSIS — I69354 Hemiplegia and hemiparesis following cerebral infarction affecting left non-dominant side: Secondary | ICD-10-CM | POA: Diagnosis not present

## 2018-03-25 DIAGNOSIS — I255 Ischemic cardiomyopathy: Secondary | ICD-10-CM | POA: Diagnosis not present

## 2018-03-25 DIAGNOSIS — I2510 Atherosclerotic heart disease of native coronary artery without angina pectoris: Secondary | ICD-10-CM | POA: Diagnosis not present

## 2018-03-26 DIAGNOSIS — I2510 Atherosclerotic heart disease of native coronary artery without angina pectoris: Secondary | ICD-10-CM | POA: Diagnosis not present

## 2018-03-26 DIAGNOSIS — E119 Type 2 diabetes mellitus without complications: Secondary | ICD-10-CM | POA: Diagnosis not present

## 2018-03-26 DIAGNOSIS — I255 Ischemic cardiomyopathy: Secondary | ICD-10-CM | POA: Diagnosis not present

## 2018-03-26 DIAGNOSIS — I69354 Hemiplegia and hemiparesis following cerebral infarction affecting left non-dominant side: Secondary | ICD-10-CM | POA: Diagnosis not present

## 2018-03-29 DIAGNOSIS — G4733 Obstructive sleep apnea (adult) (pediatric): Secondary | ICD-10-CM | POA: Diagnosis not present

## 2018-03-30 DIAGNOSIS — Z8673 Personal history of transient ischemic attack (TIA), and cerebral infarction without residual deficits: Secondary | ICD-10-CM | POA: Diagnosis not present

## 2018-03-30 DIAGNOSIS — G4733 Obstructive sleep apnea (adult) (pediatric): Secondary | ICD-10-CM | POA: Diagnosis not present

## 2018-03-30 DIAGNOSIS — E669 Obesity, unspecified: Secondary | ICD-10-CM | POA: Diagnosis not present

## 2018-03-30 DIAGNOSIS — G4737 Central sleep apnea in conditions classified elsewhere: Secondary | ICD-10-CM | POA: Diagnosis not present

## 2018-03-31 ENCOUNTER — Encounter: Payer: Self-pay | Admitting: Psychology

## 2018-03-31 ENCOUNTER — Ambulatory Visit: Payer: Medicare HMO | Admitting: Psychology

## 2018-03-31 DIAGNOSIS — I69391 Dysphagia following cerebral infarction: Secondary | ICD-10-CM | POA: Diagnosis not present

## 2018-03-31 DIAGNOSIS — B370 Candidal stomatitis: Secondary | ICD-10-CM | POA: Diagnosis not present

## 2018-03-31 DIAGNOSIS — Z720 Tobacco use: Secondary | ICD-10-CM | POA: Diagnosis not present

## 2018-03-31 DIAGNOSIS — Z7901 Long term (current) use of anticoagulants: Secondary | ICD-10-CM | POA: Diagnosis not present

## 2018-03-31 DIAGNOSIS — Z79899 Other long term (current) drug therapy: Secondary | ICD-10-CM | POA: Diagnosis not present

## 2018-03-31 DIAGNOSIS — I25119 Atherosclerotic heart disease of native coronary artery with unspecified angina pectoris: Secondary | ICD-10-CM | POA: Diagnosis not present

## 2018-03-31 DIAGNOSIS — J029 Acute pharyngitis, unspecified: Secondary | ICD-10-CM | POA: Diagnosis not present

## 2018-03-31 DIAGNOSIS — R1310 Dysphagia, unspecified: Secondary | ICD-10-CM | POA: Diagnosis not present

## 2018-03-31 DIAGNOSIS — R413 Other amnesia: Secondary | ICD-10-CM

## 2018-03-31 DIAGNOSIS — Z7982 Long term (current) use of aspirin: Secondary | ICD-10-CM | POA: Diagnosis not present

## 2018-03-31 DIAGNOSIS — Z7902 Long term (current) use of antithrombotics/antiplatelets: Secondary | ICD-10-CM | POA: Diagnosis not present

## 2018-03-31 NOTE — Progress Notes (Signed)
   Neuropsychology Notes KINCADE GRANBERG completed 135 minutes of neuropsychological testing with technician, Milana Kidney, BS, under the supervision of Dr. Macarthur Critchley, Licensed Psychologist. The patient did not appear overtly distressed by the testing session, per behavioral observation or via self-report to the technician. Rest breaks were offered.   Clinical Decision Making: In considering the patient's current level of functioning, level of presumed impairment, nature of symptoms, emotional and behavioral responses during the interview, level of literacy, and observed level of motivation/effort, a battery of tests was selected and communicated to the psychometrician.  Communication between the psychologist and technician was ongoing throughout the testing session and changes were made as deemed necessary based on patient performance on testing, technician observations and additional pertinent factors such as those listed above.  [name] will return within approximately 2 weeks for an interactive feedback session with Dr. Si Raider at which time his test performances, clinical impressions and treatment recommendations will be reviewed in detail. The patient understands he can contact our office should he require our assistance before this time.  35 minutes spent performing neuropsychological evaluation services/clinical decision making (psychologist). [CPT 05183] 135 minutes spent face-to-face with patient administering standardized tests, 30 minutes spent scoring (technician). [CPT [id number], 35825]  Full report to follow.

## 2018-04-03 DIAGNOSIS — I255 Ischemic cardiomyopathy: Secondary | ICD-10-CM | POA: Diagnosis not present

## 2018-04-03 DIAGNOSIS — E119 Type 2 diabetes mellitus without complications: Secondary | ICD-10-CM | POA: Diagnosis not present

## 2018-04-03 DIAGNOSIS — I69354 Hemiplegia and hemiparesis following cerebral infarction affecting left non-dominant side: Secondary | ICD-10-CM | POA: Diagnosis not present

## 2018-04-03 DIAGNOSIS — I2510 Atherosclerotic heart disease of native coronary artery without angina pectoris: Secondary | ICD-10-CM | POA: Diagnosis not present

## 2018-04-04 DIAGNOSIS — I69398 Other sequelae of cerebral infarction: Secondary | ICD-10-CM | POA: Diagnosis not present

## 2018-04-04 DIAGNOSIS — I2510 Atherosclerotic heart disease of native coronary artery without angina pectoris: Secondary | ICD-10-CM | POA: Diagnosis not present

## 2018-04-06 DIAGNOSIS — E119 Type 2 diabetes mellitus without complications: Secondary | ICD-10-CM | POA: Diagnosis not present

## 2018-04-06 DIAGNOSIS — I255 Ischemic cardiomyopathy: Secondary | ICD-10-CM | POA: Diagnosis not present

## 2018-04-06 DIAGNOSIS — I69354 Hemiplegia and hemiparesis following cerebral infarction affecting left non-dominant side: Secondary | ICD-10-CM | POA: Diagnosis not present

## 2018-04-06 DIAGNOSIS — I2510 Atherosclerotic heart disease of native coronary artery without angina pectoris: Secondary | ICD-10-CM | POA: Diagnosis not present

## 2018-04-07 ENCOUNTER — Encounter: Payer: Self-pay | Admitting: Physical Medicine & Rehabilitation

## 2018-04-07 ENCOUNTER — Ambulatory Visit (HOSPITAL_BASED_OUTPATIENT_CLINIC_OR_DEPARTMENT_OTHER): Payer: Medicare HMO | Admitting: Physical Medicine & Rehabilitation

## 2018-04-07 ENCOUNTER — Encounter: Payer: Medicare HMO | Attending: Physical Medicine & Rehabilitation

## 2018-04-07 VITALS — BP 126/71 | HR 88 | Ht 71.0 in | Wt 279.0 lb

## 2018-04-07 DIAGNOSIS — G8110 Spastic hemiplegia affecting unspecified side: Secondary | ICD-10-CM

## 2018-04-07 DIAGNOSIS — J449 Chronic obstructive pulmonary disease, unspecified: Secondary | ICD-10-CM | POA: Diagnosis not present

## 2018-04-07 DIAGNOSIS — E782 Mixed hyperlipidemia: Secondary | ICD-10-CM | POA: Diagnosis not present

## 2018-04-07 DIAGNOSIS — I2510 Atherosclerotic heart disease of native coronary artery without angina pectoris: Secondary | ICD-10-CM | POA: Diagnosis not present

## 2018-04-07 DIAGNOSIS — E119 Type 2 diabetes mellitus without complications: Secondary | ICD-10-CM | POA: Insufficient documentation

## 2018-04-07 DIAGNOSIS — I110 Hypertensive heart disease with heart failure: Secondary | ICD-10-CM | POA: Insufficient documentation

## 2018-04-07 DIAGNOSIS — I509 Heart failure, unspecified: Secondary | ICD-10-CM | POA: Insufficient documentation

## 2018-04-07 DIAGNOSIS — F419 Anxiety disorder, unspecified: Secondary | ICD-10-CM | POA: Diagnosis not present

## 2018-04-07 DIAGNOSIS — M1990 Unspecified osteoarthritis, unspecified site: Secondary | ICD-10-CM | POA: Diagnosis not present

## 2018-04-07 DIAGNOSIS — F17210 Nicotine dependence, cigarettes, uncomplicated: Secondary | ICD-10-CM | POA: Diagnosis not present

## 2018-04-07 DIAGNOSIS — G4733 Obstructive sleep apnea (adult) (pediatric): Secondary | ICD-10-CM | POA: Insufficient documentation

## 2018-04-07 DIAGNOSIS — I255 Ischemic cardiomyopathy: Secondary | ICD-10-CM | POA: Diagnosis not present

## 2018-04-07 DIAGNOSIS — I252 Old myocardial infarction: Secondary | ICD-10-CM | POA: Diagnosis not present

## 2018-04-07 DIAGNOSIS — I69354 Hemiplegia and hemiparesis following cerebral infarction affecting left non-dominant side: Secondary | ICD-10-CM | POA: Diagnosis not present

## 2018-04-07 NOTE — Progress Notes (Signed)
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 This includes bleeding, bruising, infection, excessive weakness, or medication side effects. A REMS form is on file and signed. Needle:  needle electrode Number of units per muscle LUE FDS 100 FDP 100 FPL 100 Biceps 100 Brachiorad 100  LLE Medial Hamstrings 500  Med gastroc 100 Lat Gastro 100 Med soleus 100 Lat soleus 100 Tibialis post 100 All injections were done after obtaining appropriate EMG activity and after negative drawback for blood. The patient tolerated the procedure well. Post procedure instructions were given. A followup appointment was made.

## 2018-04-07 NOTE — Patient Instructions (Signed)

## 2018-04-08 DIAGNOSIS — E119 Type 2 diabetes mellitus without complications: Secondary | ICD-10-CM | POA: Diagnosis not present

## 2018-04-08 DIAGNOSIS — I255 Ischemic cardiomyopathy: Secondary | ICD-10-CM | POA: Diagnosis not present

## 2018-04-08 DIAGNOSIS — I69354 Hemiplegia and hemiparesis following cerebral infarction affecting left non-dominant side: Secondary | ICD-10-CM | POA: Diagnosis not present

## 2018-04-08 DIAGNOSIS — I2510 Atherosclerotic heart disease of native coronary artery without angina pectoris: Secondary | ICD-10-CM | POA: Diagnosis not present

## 2018-04-09 DIAGNOSIS — I2510 Atherosclerotic heart disease of native coronary artery without angina pectoris: Secondary | ICD-10-CM | POA: Diagnosis not present

## 2018-04-09 DIAGNOSIS — I69354 Hemiplegia and hemiparesis following cerebral infarction affecting left non-dominant side: Secondary | ICD-10-CM | POA: Diagnosis not present

## 2018-04-09 DIAGNOSIS — I255 Ischemic cardiomyopathy: Secondary | ICD-10-CM | POA: Diagnosis not present

## 2018-04-09 DIAGNOSIS — E119 Type 2 diabetes mellitus without complications: Secondary | ICD-10-CM | POA: Diagnosis not present

## 2018-04-10 DIAGNOSIS — I2510 Atherosclerotic heart disease of native coronary artery without angina pectoris: Secondary | ICD-10-CM | POA: Diagnosis not present

## 2018-04-10 DIAGNOSIS — I69354 Hemiplegia and hemiparesis following cerebral infarction affecting left non-dominant side: Secondary | ICD-10-CM | POA: Diagnosis not present

## 2018-04-10 DIAGNOSIS — I69398 Other sequelae of cerebral infarction: Secondary | ICD-10-CM | POA: Diagnosis not present

## 2018-04-10 DIAGNOSIS — E119 Type 2 diabetes mellitus without complications: Secondary | ICD-10-CM | POA: Diagnosis not present

## 2018-04-10 DIAGNOSIS — J449 Chronic obstructive pulmonary disease, unspecified: Secondary | ICD-10-CM | POA: Diagnosis not present

## 2018-04-10 DIAGNOSIS — I255 Ischemic cardiomyopathy: Secondary | ICD-10-CM | POA: Diagnosis not present

## 2018-04-13 DIAGNOSIS — Z79899 Other long term (current) drug therapy: Secondary | ICD-10-CM | POA: Diagnosis not present

## 2018-04-13 DIAGNOSIS — E119 Type 2 diabetes mellitus without complications: Secondary | ICD-10-CM | POA: Diagnosis not present

## 2018-04-13 DIAGNOSIS — I2510 Atherosclerotic heart disease of native coronary artery without angina pectoris: Secondary | ICD-10-CM | POA: Diagnosis not present

## 2018-04-13 DIAGNOSIS — G894 Chronic pain syndrome: Secondary | ICD-10-CM | POA: Diagnosis not present

## 2018-04-13 DIAGNOSIS — F1120 Opioid dependence, uncomplicated: Secondary | ICD-10-CM | POA: Diagnosis not present

## 2018-04-13 DIAGNOSIS — I69354 Hemiplegia and hemiparesis following cerebral infarction affecting left non-dominant side: Secondary | ICD-10-CM | POA: Diagnosis not present

## 2018-04-13 DIAGNOSIS — R05 Cough: Secondary | ICD-10-CM | POA: Diagnosis not present

## 2018-04-13 DIAGNOSIS — J441 Chronic obstructive pulmonary disease with (acute) exacerbation: Secondary | ICD-10-CM | POA: Diagnosis not present

## 2018-04-13 DIAGNOSIS — I639 Cerebral infarction, unspecified: Secondary | ICD-10-CM | POA: Diagnosis not present

## 2018-04-13 DIAGNOSIS — I255 Ischemic cardiomyopathy: Secondary | ICD-10-CM | POA: Diagnosis not present

## 2018-04-13 DIAGNOSIS — R110 Nausea: Secondary | ICD-10-CM | POA: Diagnosis not present

## 2018-04-13 DIAGNOSIS — R300 Dysuria: Secondary | ICD-10-CM | POA: Diagnosis not present

## 2018-04-14 DIAGNOSIS — J449 Chronic obstructive pulmonary disease, unspecified: Secondary | ICD-10-CM | POA: Diagnosis not present

## 2018-04-14 DIAGNOSIS — E119 Type 2 diabetes mellitus without complications: Secondary | ICD-10-CM | POA: Diagnosis not present

## 2018-04-14 DIAGNOSIS — E1165 Type 2 diabetes mellitus with hyperglycemia: Secondary | ICD-10-CM | POA: Diagnosis not present

## 2018-04-14 DIAGNOSIS — I10 Essential (primary) hypertension: Secondary | ICD-10-CM | POA: Diagnosis not present

## 2018-04-14 DIAGNOSIS — I69398 Other sequelae of cerebral infarction: Secondary | ICD-10-CM | POA: Diagnosis not present

## 2018-04-14 DIAGNOSIS — E782 Mixed hyperlipidemia: Secondary | ICD-10-CM | POA: Diagnosis not present

## 2018-04-14 DIAGNOSIS — I2510 Atherosclerotic heart disease of native coronary artery without angina pectoris: Secondary | ICD-10-CM | POA: Diagnosis not present

## 2018-04-14 DIAGNOSIS — Z9189 Other specified personal risk factors, not elsewhere classified: Secondary | ICD-10-CM | POA: Diagnosis not present

## 2018-04-14 DIAGNOSIS — G4733 Obstructive sleep apnea (adult) (pediatric): Secondary | ICD-10-CM | POA: Diagnosis not present

## 2018-04-14 DIAGNOSIS — F17210 Nicotine dependence, cigarettes, uncomplicated: Secondary | ICD-10-CM | POA: Diagnosis not present

## 2018-04-14 DIAGNOSIS — I69354 Hemiplegia and hemiparesis following cerebral infarction affecting left non-dominant side: Secondary | ICD-10-CM | POA: Diagnosis not present

## 2018-04-14 DIAGNOSIS — K210 Gastro-esophageal reflux disease with esophagitis: Secondary | ICD-10-CM | POA: Diagnosis not present

## 2018-04-14 DIAGNOSIS — I255 Ischemic cardiomyopathy: Secondary | ICD-10-CM | POA: Diagnosis not present

## 2018-04-14 DIAGNOSIS — E8881 Metabolic syndrome: Secondary | ICD-10-CM | POA: Diagnosis not present

## 2018-04-15 DIAGNOSIS — I2510 Atherosclerotic heart disease of native coronary artery without angina pectoris: Secondary | ICD-10-CM | POA: Diagnosis not present

## 2018-04-15 DIAGNOSIS — I255 Ischemic cardiomyopathy: Secondary | ICD-10-CM | POA: Diagnosis not present

## 2018-04-15 DIAGNOSIS — I69354 Hemiplegia and hemiparesis following cerebral infarction affecting left non-dominant side: Secondary | ICD-10-CM | POA: Diagnosis not present

## 2018-04-15 DIAGNOSIS — E119 Type 2 diabetes mellitus without complications: Secondary | ICD-10-CM | POA: Diagnosis not present

## 2018-04-16 DIAGNOSIS — I255 Ischemic cardiomyopathy: Secondary | ICD-10-CM | POA: Diagnosis not present

## 2018-04-16 DIAGNOSIS — I69354 Hemiplegia and hemiparesis following cerebral infarction affecting left non-dominant side: Secondary | ICD-10-CM | POA: Diagnosis not present

## 2018-04-16 DIAGNOSIS — Z131 Encounter for screening for diabetes mellitus: Secondary | ICD-10-CM | POA: Diagnosis not present

## 2018-04-16 DIAGNOSIS — Z79899 Other long term (current) drug therapy: Secondary | ICD-10-CM | POA: Diagnosis not present

## 2018-04-16 DIAGNOSIS — E119 Type 2 diabetes mellitus without complications: Secondary | ICD-10-CM | POA: Diagnosis not present

## 2018-04-16 DIAGNOSIS — R5383 Other fatigue: Secondary | ICD-10-CM | POA: Diagnosis not present

## 2018-04-16 DIAGNOSIS — E7800 Pure hypercholesterolemia, unspecified: Secondary | ICD-10-CM | POA: Diagnosis not present

## 2018-04-16 DIAGNOSIS — I2510 Atherosclerotic heart disease of native coronary artery without angina pectoris: Secondary | ICD-10-CM | POA: Diagnosis not present

## 2018-04-17 DIAGNOSIS — I255 Ischemic cardiomyopathy: Secondary | ICD-10-CM | POA: Diagnosis not present

## 2018-04-17 DIAGNOSIS — I69354 Hemiplegia and hemiparesis following cerebral infarction affecting left non-dominant side: Secondary | ICD-10-CM | POA: Diagnosis not present

## 2018-04-17 DIAGNOSIS — I2510 Atherosclerotic heart disease of native coronary artery without angina pectoris: Secondary | ICD-10-CM | POA: Diagnosis not present

## 2018-04-17 DIAGNOSIS — E119 Type 2 diabetes mellitus without complications: Secondary | ICD-10-CM | POA: Diagnosis not present

## 2018-04-17 DIAGNOSIS — J4530 Mild persistent asthma, uncomplicated: Secondary | ICD-10-CM | POA: Diagnosis not present

## 2018-04-17 DIAGNOSIS — Z23 Encounter for immunization: Secondary | ICD-10-CM | POA: Diagnosis not present

## 2018-04-17 DIAGNOSIS — F17210 Nicotine dependence, cigarettes, uncomplicated: Secondary | ICD-10-CM | POA: Diagnosis not present

## 2018-04-17 DIAGNOSIS — I10 Essential (primary) hypertension: Secondary | ICD-10-CM | POA: Diagnosis not present

## 2018-04-17 DIAGNOSIS — Z0001 Encounter for general adult medical examination with abnormal findings: Secondary | ICD-10-CM | POA: Diagnosis not present

## 2018-04-20 DIAGNOSIS — E119 Type 2 diabetes mellitus without complications: Secondary | ICD-10-CM | POA: Diagnosis not present

## 2018-04-20 DIAGNOSIS — I255 Ischemic cardiomyopathy: Secondary | ICD-10-CM | POA: Diagnosis not present

## 2018-04-20 DIAGNOSIS — I69354 Hemiplegia and hemiparesis following cerebral infarction affecting left non-dominant side: Secondary | ICD-10-CM | POA: Diagnosis not present

## 2018-04-20 DIAGNOSIS — I2510 Atherosclerotic heart disease of native coronary artery without angina pectoris: Secondary | ICD-10-CM | POA: Diagnosis not present

## 2018-04-21 DIAGNOSIS — I255 Ischemic cardiomyopathy: Secondary | ICD-10-CM | POA: Diagnosis not present

## 2018-04-21 DIAGNOSIS — E119 Type 2 diabetes mellitus without complications: Secondary | ICD-10-CM | POA: Diagnosis not present

## 2018-04-21 DIAGNOSIS — I2510 Atherosclerotic heart disease of native coronary artery without angina pectoris: Secondary | ICD-10-CM | POA: Diagnosis not present

## 2018-04-21 DIAGNOSIS — I69354 Hemiplegia and hemiparesis following cerebral infarction affecting left non-dominant side: Secondary | ICD-10-CM | POA: Diagnosis not present

## 2018-04-22 DIAGNOSIS — E119 Type 2 diabetes mellitus without complications: Secondary | ICD-10-CM | POA: Diagnosis not present

## 2018-04-22 DIAGNOSIS — I2510 Atherosclerotic heart disease of native coronary artery without angina pectoris: Secondary | ICD-10-CM | POA: Diagnosis not present

## 2018-04-22 DIAGNOSIS — I69354 Hemiplegia and hemiparesis following cerebral infarction affecting left non-dominant side: Secondary | ICD-10-CM | POA: Diagnosis not present

## 2018-04-22 DIAGNOSIS — I255 Ischemic cardiomyopathy: Secondary | ICD-10-CM | POA: Diagnosis not present

## 2018-04-23 DIAGNOSIS — E119 Type 2 diabetes mellitus without complications: Secondary | ICD-10-CM | POA: Diagnosis not present

## 2018-04-23 DIAGNOSIS — I255 Ischemic cardiomyopathy: Secondary | ICD-10-CM | POA: Diagnosis not present

## 2018-04-23 DIAGNOSIS — N184 Chronic kidney disease, stage 4 (severe): Secondary | ICD-10-CM | POA: Diagnosis not present

## 2018-04-23 DIAGNOSIS — I2510 Atherosclerotic heart disease of native coronary artery without angina pectoris: Secondary | ICD-10-CM | POA: Diagnosis not present

## 2018-04-23 DIAGNOSIS — N189 Chronic kidney disease, unspecified: Secondary | ICD-10-CM | POA: Diagnosis not present

## 2018-04-23 DIAGNOSIS — I69354 Hemiplegia and hemiparesis following cerebral infarction affecting left non-dominant side: Secondary | ICD-10-CM | POA: Diagnosis not present

## 2018-04-24 DIAGNOSIS — E119 Type 2 diabetes mellitus without complications: Secondary | ICD-10-CM | POA: Diagnosis not present

## 2018-04-24 DIAGNOSIS — I255 Ischemic cardiomyopathy: Secondary | ICD-10-CM | POA: Diagnosis not present

## 2018-04-24 DIAGNOSIS — I69354 Hemiplegia and hemiparesis following cerebral infarction affecting left non-dominant side: Secondary | ICD-10-CM | POA: Diagnosis not present

## 2018-04-24 DIAGNOSIS — I2510 Atherosclerotic heart disease of native coronary artery without angina pectoris: Secondary | ICD-10-CM | POA: Diagnosis not present
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
## 2018-04-27 ENCOUNTER — Encounter: Payer: Self-pay | Admitting: Psychology

## 2018-04-27 ENCOUNTER — Ambulatory Visit (INDEPENDENT_AMBULATORY_CARE_PROVIDER_SITE_OTHER): Payer: Medicare HMO | Admitting: Psychology

## 2018-04-27 DIAGNOSIS — F0151 Vascular dementia with behavioral disturbance: Secondary | ICD-10-CM

## 2018-04-27 DIAGNOSIS — F332 Major depressive disorder, recurrent severe without psychotic features: Secondary | ICD-10-CM

## 2018-04-27 DIAGNOSIS — I2510 Atherosclerotic heart disease of native coronary artery without angina pectoris: Secondary | ICD-10-CM | POA: Diagnosis not present

## 2018-04-27 DIAGNOSIS — I679 Cerebrovascular disease, unspecified: Secondary | ICD-10-CM

## 2018-04-27 DIAGNOSIS — F0391 Unspecified dementia with behavioral disturbance: Secondary | ICD-10-CM

## 2018-04-27 DIAGNOSIS — I69919 Unspecified symptoms and signs involving cognitive functions following unspecified cerebrovascular disease: Secondary | ICD-10-CM | POA: Diagnosis not present

## 2018-04-27 DIAGNOSIS — I69354 Hemiplegia and hemiparesis following cerebral infarction affecting left non-dominant side: Secondary | ICD-10-CM | POA: Diagnosis not present

## 2018-04-27 DIAGNOSIS — I255 Ischemic cardiomyopathy: Secondary | ICD-10-CM | POA: Diagnosis not present

## 2018-04-27 DIAGNOSIS — F419 Anxiety disorder, unspecified: Secondary | ICD-10-CM

## 2018-04-27 DIAGNOSIS — I639 Cerebral infarction, unspecified: Secondary | ICD-10-CM

## 2018-04-27 DIAGNOSIS — E119 Type 2 diabetes mellitus without complications: Secondary | ICD-10-CM | POA: Diagnosis not present

## 2018-04-27 NOTE — Patient Instructions (Addendum)
On testing, you demonstrated the following strengths: Visual-spatial perception and Scientist, forensic (naming, word generation, comprehension) Learning and memory of new information  You demonstrated the following areas of difficulty: Processing speed (takes more effort, and takes longer to think through and complete tasks) Attention Executive functioning (things like planning, sequencing, shifting attention back and forth, problem-solving)  The areas of difficulty/impairment are likely due to cerebrovascular disease (often called vascular cognitive impairment).   Vascular Cognitive Impairment  Vascular cognitive impairment is a change in thinking skills as a result of conditions that block or reduce blood flow to various regions of the brain. To be healthy and function properly, brain cells need an adequate supply of oxygen and nutrients. Oxygen and nutrients are delivered to brain cells via the blood, which travels through a network of vessels called the vascular system. If the vascular system becomes damaged, brain cells cannot get the oxygen and nutrients they need and they will eventually die.   This decreased oxygen to the brain very often causes changes to the small vessels of the brain and can result in both subtle and more noticeable thinking and memory problems. These thinking difficulties can begin as mild changes that worsen gradually over time as a result of multiple small strokes or small vessel ischemic disease. Small vessel ischemic disease occurs when the small blood vessels that lead to the brain do not receive an adequate supply of blood over a relatively long period of time. Small vessel ischemic disease results from a history of multiple vascular risk factors (see below). It is also known by several other terms such as "microvascular disease" and "white matter disease". Common early signs of small vessel ischemic disease are reduced ability to pay attention, slowed  information processing, difficulty finding the right words, and impaired planning and judgment.   There are a number of conditions that can damage the vascular system, including high blood pressure, high cholesterol, heart problems, and diabetes. Smoking, obesity, heavy alcohol use, and stress can also result in damage to the vascular system. All of these variables are referred to as "vascular risk factors." The more risk factors one has, the greater the likelihood that he or she will experience problems in thinking and memory.   Reducing Risk Any condition that damages blood vessels anywhere in the body can cause brain changes linked to vascular cognitive impairment. However, the following strategies can help to protect the brain and reduce one's risk of cognitive decline:    . Don't smoke . Keep blood pressure, cholesterol and blood sugar within recommended limits . Engage in consistent, safe cardiovascular exercise . Maintain compliance with prescribed medications . Eat a healthy, balanced diet . Maintain a healthy weight . Limit alcohol consumption     Strategies to enhance cognitive functioning Attention, concentration, memory encoding and consolidation    . Make a plan and be prepared o If you find that you are more attentive at certain times of the day (i.e., the morning), plan important activities and discussions at that time o Determine which activities take the most time and which are most important, then prioritize your "to do list" based on this information o Break tasks into simpler parts, understand the steps involved before starting o Rehearse the steps mentally or write them down. If you write them down, you can use this as a checklist to check off as you complete them. o Visualize completing the task  . Use external aids  o Write everything down that you do not  need to know or work with right now. Don't store extra information in your brain that you don't need right  now.  o Use a calendar or planner to make checklists, due dates and "to do" lists. o Use ONLY ONE calendar or planner and look at it frequently o Set alarms for important deadlines or appointments  . Minimize interruptions and distractions  o Find a good work environment, e.g., quiet room with a desk, close curtains, use earplugs, mask sounds with a fan or white noise machine o Turn off cell phone and/or email alerts during important tasks. In fact, it is helpful to schedule a block of time each day where you limit phone and email interruptions and focus on just the more detailed work you have. o Try to minimize the amount of background noise (i.e., television, music) when engaged in important tasks or conversations with others (note that some individuals find soft background music helpful in minimize distraction, so you may need to experiment with optimal level of noise for specific situations)  . Use active effort = consciously attending to details, closely analyzing o Failures of encoding may reflect failure to attend to one's own actions o Be prepared to work more slowly than you usually do  o When reading, allow time for re-reading sections  o Check your work for errors  . Avoid multitasking o Do not attempt to complete more than one task at one time. Focus on one task until it is completed and then move on to the next one. o Avoid other activities while engaged in important tasks, such as talking on the phone while balancing the checkbook, making a shopping list during a meeting.   . Use self-talk during tasks o Repeat the steps of the activity to yourself as you complete them o Talk to yourself about your progress o This helps you maintain focus on the task and makes it easier to remember completing the task (Similar to "active effort" above)  . Conserve energy o Conserve energy to avoid fatigue and its effects on cognition - Get enough sleep - Pace yourself  and make sure to take  breaks - Be open to receiving help - Exercise for increased energy  . Conversational vigilance = paying attention during a conversation  o Listen actively: focus on the speaker and position yourself so that you can clearly hear the him/her, and have open/relaxed posture  o Eye contact: Maintaining eye contact with the person you are speaking with may increase the likelihood that important information is properly received  o Ask questions: Ask questions for clarification (e.g., request that the speaker explain something in a different way) or ask for information to be repeated if you become distracted, or if you do not hear or understand something during a conversation  o Paraphrase: Summarize or repeat back important information from a conversation in your own words to facilitate communication and ensure that you have heard correctly and understand   My recommendations: Based on the findings of the present evaluation, the following recommendations are offered:  1. Optimal control of vascular risk factors, including smoking cessation (but mood/motivation will likely need to be addressed first in order for smoking cessation to be able to occur). 2. He requires continued assistance with all complex ADLs (transportation, finances, medications, appointments, cooking, shopping). 3. Based on test results, it is apparent that he will require more time to process information and complete tasks. Forgetfulness in daily life is likely due to trouble sustaining attention  and reduced processing speed. He may benefit from behavioral strategies to help compensate for these deficits. I provided written information discussing strategies that may be useful (above). 4. Continue follow up with psychiatry. 5. Recommend counseling to assist with mood, behavioral changes, and adjustment to chronic illness/disability, as well as smoking cessation. 6. Recommend cognitive rehabilitation (typically done through speech  therapy) - at home or through Neurorehab if possible - to focus on compensatory strategies for cognitive deficits.

## 2018-04-28 DIAGNOSIS — I2510 Atherosclerotic heart disease of native coronary artery without angina pectoris: Secondary | ICD-10-CM | POA: Diagnosis not present

## 2018-04-28 DIAGNOSIS — I255 Ischemic cardiomyopathy: Secondary | ICD-10-CM | POA: Diagnosis not present

## 2018-04-28 DIAGNOSIS — I69354 Hemiplegia and hemiparesis following cerebral infarction affecting left non-dominant side: Secondary | ICD-10-CM | POA: Diagnosis not present

## 2018-04-28 DIAGNOSIS — E119 Type 2 diabetes mellitus without complications: Secondary | ICD-10-CM | POA: Diagnosis not present

## 2018-04-30 DIAGNOSIS — I255 Ischemic cardiomyopathy: Secondary | ICD-10-CM | POA: Diagnosis not present

## 2018-04-30 DIAGNOSIS — I69354 Hemiplegia and hemiparesis following cerebral infarction affecting left non-dominant side: Secondary | ICD-10-CM | POA: Diagnosis not present

## 2018-04-30 DIAGNOSIS — E119 Type 2 diabetes mellitus without complications: Secondary | ICD-10-CM | POA: Diagnosis not present

## 2018-04-30 DIAGNOSIS — I2510 Atherosclerotic heart disease of native coronary artery without angina pectoris: Secondary | ICD-10-CM | POA: Diagnosis not present

## 2018-04-30 DIAGNOSIS — K210 Gastro-esophageal reflux disease with esophagitis: Secondary | ICD-10-CM | POA: Diagnosis not present

## 2018-04-30 DIAGNOSIS — E1165 Type 2 diabetes mellitus with hyperglycemia: Secondary | ICD-10-CM | POA: Diagnosis not present

## 2018-04-30 DIAGNOSIS — E782 Mixed hyperlipidemia: Secondary | ICD-10-CM | POA: Diagnosis not present

## 2018-04-30 DIAGNOSIS — E8881 Metabolic syndrome: Secondary | ICD-10-CM | POA: Diagnosis not present

## 2018-05-01 DIAGNOSIS — I255 Ischemic cardiomyopathy: Secondary | ICD-10-CM | POA: Diagnosis not present

## 2018-05-01 DIAGNOSIS — F17210 Nicotine dependence, cigarettes, uncomplicated: Secondary | ICD-10-CM | POA: Diagnosis not present

## 2018-05-01 DIAGNOSIS — R079 Chest pain, unspecified: Secondary | ICD-10-CM | POA: Diagnosis not present

## 2018-05-01 DIAGNOSIS — R0602 Shortness of breath: Secondary | ICD-10-CM | POA: Diagnosis not present

## 2018-05-01 DIAGNOSIS — R05 Cough: Secondary | ICD-10-CM | POA: Diagnosis not present

## 2018-05-01 DIAGNOSIS — I69354 Hemiplegia and hemiparesis following cerebral infarction affecting left non-dominant side: Secondary | ICD-10-CM | POA: Diagnosis not present

## 2018-05-01 DIAGNOSIS — R0789 Other chest pain: Secondary | ICD-10-CM | POA: Diagnosis not present

## 2018-05-01 DIAGNOSIS — E119 Type 2 diabetes mellitus without complications: Secondary | ICD-10-CM | POA: Diagnosis not present

## 2018-05-01 DIAGNOSIS — I2510 Atherosclerotic heart disease of native coronary artery without angina pectoris: Secondary | ICD-10-CM | POA: Diagnosis not present

## 2018-05-01 DIAGNOSIS — Z720 Tobacco use: Secondary | ICD-10-CM | POA: Diagnosis not present

## 2018-05-04 DIAGNOSIS — I69354 Hemiplegia and hemiparesis following cerebral infarction affecting left non-dominant side: Secondary | ICD-10-CM | POA: Diagnosis not present

## 2018-05-04 DIAGNOSIS — I2510 Atherosclerotic heart disease of native coronary artery without angina pectoris: Secondary | ICD-10-CM | POA: Diagnosis not present

## 2018-05-04 DIAGNOSIS — E119 Type 2 diabetes mellitus without complications: Secondary | ICD-10-CM | POA: Diagnosis not present

## 2018-05-04 DIAGNOSIS — I255 Ischemic cardiomyopathy: Secondary | ICD-10-CM | POA: Diagnosis not present

## 2018-05-04 DIAGNOSIS — I69398 Other sequelae of cerebral infarction: Secondary | ICD-10-CM | POA: Diagnosis not present

## 2018-05-06 DIAGNOSIS — M1612 Unilateral primary osteoarthritis, left hip: Secondary | ICD-10-CM | POA: Diagnosis not present

## 2018-05-06 DIAGNOSIS — E119 Type 2 diabetes mellitus without complications: Secondary | ICD-10-CM | POA: Diagnosis not present

## 2018-05-06 DIAGNOSIS — I2510 Atherosclerotic heart disease of native coronary artery without angina pectoris: Secondary | ICD-10-CM | POA: Diagnosis not present

## 2018-05-06 DIAGNOSIS — J449 Chronic obstructive pulmonary disease, unspecified: Secondary | ICD-10-CM | POA: Diagnosis not present

## 2018-05-06 DIAGNOSIS — F331 Major depressive disorder, recurrent, moderate: Secondary | ICD-10-CM | POA: Diagnosis not present

## 2018-05-06 DIAGNOSIS — N184 Chronic kidney disease, stage 4 (severe): Secondary | ICD-10-CM | POA: Diagnosis not present

## 2018-05-06 DIAGNOSIS — I110 Hypertensive heart disease with heart failure: Secondary | ICD-10-CM | POA: Diagnosis not present

## 2018-05-06 DIAGNOSIS — I69354 Hemiplegia and hemiparesis following cerebral infarction affecting left non-dominant side: Secondary | ICD-10-CM | POA: Diagnosis not present

## 2018-05-06 DIAGNOSIS — I5022 Chronic systolic (congestive) heart failure: Secondary | ICD-10-CM | POA: Diagnosis not present

## 2018-05-06 DIAGNOSIS — I255 Ischemic cardiomyopathy: Secondary | ICD-10-CM | POA: Diagnosis not present

## 2018-05-07 DIAGNOSIS — I69354 Hemiplegia and hemiparesis following cerebral infarction affecting left non-dominant side: Secondary | ICD-10-CM | POA: Diagnosis not present

## 2018-05-07 DIAGNOSIS — I255 Ischemic cardiomyopathy: Secondary | ICD-10-CM | POA: Diagnosis not present

## 2018-05-07 DIAGNOSIS — E119 Type 2 diabetes mellitus without complications: Secondary | ICD-10-CM | POA: Diagnosis not present

## 2018-05-07 DIAGNOSIS — I2510 Atherosclerotic heart disease of native coronary artery without angina pectoris: Secondary | ICD-10-CM | POA: Diagnosis not present

## 2018-05-08 DIAGNOSIS — E119 Type 2 diabetes mellitus without complications: Secondary | ICD-10-CM | POA: Diagnosis not present

## 2018-05-08 DIAGNOSIS — I255 Ischemic cardiomyopathy: Secondary | ICD-10-CM | POA: Diagnosis not present

## 2018-05-08 DIAGNOSIS — I2510 Atherosclerotic heart disease of native coronary artery without angina pectoris: Secondary | ICD-10-CM | POA: Diagnosis not present

## 2018-05-08 DIAGNOSIS — I69354 Hemiplegia and hemiparesis following cerebral infarction affecting left non-dominant side: Secondary | ICD-10-CM | POA: Diagnosis not present

## 2018-05-10 DIAGNOSIS — I2510 Atherosclerotic heart disease of native coronary artery without angina pectoris: Secondary | ICD-10-CM | POA: Diagnosis not present

## 2018-05-10 DIAGNOSIS — J449 Chronic obstructive pulmonary disease, unspecified: Secondary | ICD-10-CM | POA: Diagnosis not present

## 2018-05-10 DIAGNOSIS — I69398 Other sequelae of cerebral infarction: Secondary | ICD-10-CM | POA: Diagnosis not present

## 2018-05-11 DIAGNOSIS — Z79899 Other long term (current) drug therapy: Secondary | ICD-10-CM | POA: Diagnosis not present

## 2018-05-11 DIAGNOSIS — I69354 Hemiplegia and hemiparesis following cerebral infarction affecting left non-dominant side: Secondary | ICD-10-CM | POA: Diagnosis not present

## 2018-05-11 DIAGNOSIS — E119 Type 2 diabetes mellitus without complications: Secondary | ICD-10-CM | POA: Diagnosis not present

## 2018-05-11 DIAGNOSIS — G894 Chronic pain syndrome: Secondary | ICD-10-CM | POA: Diagnosis not present

## 2018-05-11 DIAGNOSIS — F1120 Opioid dependence, uncomplicated: Secondary | ICD-10-CM | POA: Diagnosis not present

## 2018-05-11 DIAGNOSIS — I639 Cerebral infarction, unspecified: Secondary | ICD-10-CM | POA: Diagnosis not present

## 2018-05-11 DIAGNOSIS — I255 Ischemic cardiomyopathy: Secondary | ICD-10-CM | POA: Diagnosis not present

## 2018-05-11 DIAGNOSIS — I2510 Atherosclerotic heart disease of native coronary artery without angina pectoris: Secondary | ICD-10-CM | POA: Diagnosis not present

## 2018-05-12 DIAGNOSIS — I2510 Atherosclerotic heart disease of native coronary artery without angina pectoris: Secondary | ICD-10-CM | POA: Diagnosis not present

## 2018-05-12 DIAGNOSIS — J449 Chronic obstructive pulmonary disease, unspecified: Secondary | ICD-10-CM | POA: Diagnosis not present

## 2018-05-12 DIAGNOSIS — I69354 Hemiplegia and hemiparesis following cerebral infarction affecting left non-dominant side: Secondary | ICD-10-CM | POA: Diagnosis not present

## 2018-05-12 DIAGNOSIS — I110 Hypertensive heart disease with heart failure: Secondary | ICD-10-CM | POA: Diagnosis not present

## 2018-05-12 DIAGNOSIS — N184 Chronic kidney disease, stage 4 (severe): Secondary | ICD-10-CM | POA: Diagnosis not present

## 2018-05-12 DIAGNOSIS — F331 Major depressive disorder, recurrent, moderate: Secondary | ICD-10-CM | POA: Diagnosis not present

## 2018-05-12 DIAGNOSIS — I5022 Chronic systolic (congestive) heart failure: Secondary | ICD-10-CM | POA: Diagnosis not present

## 2018-05-12 DIAGNOSIS — E119 Type 2 diabetes mellitus without complications: Secondary | ICD-10-CM | POA: Diagnosis not present

## 2018-05-12 DIAGNOSIS — M1612 Unilateral primary osteoarthritis, left hip: Secondary | ICD-10-CM | POA: Diagnosis not present

## 2018-05-12 DIAGNOSIS — I255 Ischemic cardiomyopathy: Secondary | ICD-10-CM | POA: Diagnosis not present

## 2018-05-13 DIAGNOSIS — F419 Anxiety disorder, unspecified: Secondary | ICD-10-CM | POA: Diagnosis not present

## 2018-05-13 DIAGNOSIS — I69354 Hemiplegia and hemiparesis following cerebral infarction affecting left non-dominant side: Secondary | ICD-10-CM | POA: Diagnosis not present

## 2018-05-13 DIAGNOSIS — I110 Hypertensive heart disease with heart failure: Secondary | ICD-10-CM | POA: Diagnosis not present

## 2018-05-13 DIAGNOSIS — Z79899 Other long term (current) drug therapy: Secondary | ICD-10-CM | POA: Diagnosis not present

## 2018-05-13 DIAGNOSIS — J449 Chronic obstructive pulmonary disease, unspecified: Secondary | ICD-10-CM | POA: Diagnosis not present

## 2018-05-13 DIAGNOSIS — F331 Major depressive disorder, recurrent, moderate: Secondary | ICD-10-CM | POA: Diagnosis not present

## 2018-05-13 DIAGNOSIS — F39 Unspecified mood [affective] disorder: Secondary | ICD-10-CM | POA: Diagnosis not present

## 2018-05-13 DIAGNOSIS — I5022 Chronic systolic (congestive) heart failure: Secondary | ICD-10-CM | POA: Diagnosis not present

## 2018-05-13 DIAGNOSIS — N184 Chronic kidney disease, stage 4 (severe): Secondary | ICD-10-CM | POA: Diagnosis not present

## 2018-05-13 DIAGNOSIS — F329 Major depressive disorder, single episode, unspecified: Secondary | ICD-10-CM | POA: Diagnosis not present

## 2018-05-13 DIAGNOSIS — E119 Type 2 diabetes mellitus without complications: Secondary | ICD-10-CM | POA: Diagnosis not present

## 2018-05-13 DIAGNOSIS — I2510 Atherosclerotic heart disease of native coronary artery without angina pectoris: Secondary | ICD-10-CM | POA: Diagnosis not present

## 2018-05-13 DIAGNOSIS — I255 Ischemic cardiomyopathy: Secondary | ICD-10-CM | POA: Diagnosis not present

## 2018-05-13 DIAGNOSIS — M1612 Unilateral primary osteoarthritis, left hip: Secondary | ICD-10-CM | POA: Diagnosis not present

## 2018-05-14 DIAGNOSIS — I255 Ischemic cardiomyopathy: Secondary | ICD-10-CM | POA: Diagnosis not present

## 2018-05-14 DIAGNOSIS — M1612 Unilateral primary osteoarthritis, left hip: Secondary | ICD-10-CM | POA: Diagnosis not present

## 2018-05-14 DIAGNOSIS — E119 Type 2 diabetes mellitus without complications: Secondary | ICD-10-CM | POA: Diagnosis not present

## 2018-05-14 DIAGNOSIS — F331 Major depressive disorder, recurrent, moderate: Secondary | ICD-10-CM | POA: Diagnosis not present

## 2018-05-14 DIAGNOSIS — J449 Chronic obstructive pulmonary disease, unspecified: Secondary | ICD-10-CM | POA: Diagnosis not present

## 2018-05-14 DIAGNOSIS — I2510 Atherosclerotic heart disease of native coronary artery without angina pectoris: Secondary | ICD-10-CM | POA: Diagnosis not present

## 2018-05-14 DIAGNOSIS — I110 Hypertensive heart disease with heart failure: Secondary | ICD-10-CM | POA: Diagnosis not present

## 2018-05-14 DIAGNOSIS — I69354 Hemiplegia and hemiparesis following cerebral infarction affecting left non-dominant side: Secondary | ICD-10-CM | POA: Diagnosis not present

## 2018-05-14 DIAGNOSIS — N184 Chronic kidney disease, stage 4 (severe): Secondary | ICD-10-CM | POA: Diagnosis not present

## 2018-05-14 DIAGNOSIS — I5022 Chronic systolic (congestive) heart failure: Secondary | ICD-10-CM | POA: Diagnosis not present

## 2018-05-15 DIAGNOSIS — J449 Chronic obstructive pulmonary disease, unspecified: Secondary | ICD-10-CM | POA: Diagnosis not present

## 2018-05-15 DIAGNOSIS — I255 Ischemic cardiomyopathy: Secondary | ICD-10-CM | POA: Diagnosis not present

## 2018-05-15 DIAGNOSIS — F331 Major depressive disorder, recurrent, moderate: Secondary | ICD-10-CM | POA: Diagnosis not present

## 2018-05-15 DIAGNOSIS — I69354 Hemiplegia and hemiparesis following cerebral infarction affecting left non-dominant side: Secondary | ICD-10-CM | POA: Diagnosis not present

## 2018-05-15 DIAGNOSIS — I69398 Other sequelae of cerebral infarction: Secondary | ICD-10-CM | POA: Diagnosis not present

## 2018-05-15 DIAGNOSIS — G4733 Obstructive sleep apnea (adult) (pediatric): Secondary | ICD-10-CM | POA: Diagnosis not present

## 2018-05-15 DIAGNOSIS — I2510 Atherosclerotic heart disease of native coronary artery without angina pectoris: Secondary | ICD-10-CM | POA: Diagnosis not present

## 2018-05-15 DIAGNOSIS — E119 Type 2 diabetes mellitus without complications: Secondary | ICD-10-CM | POA: Diagnosis not present

## 2018-05-15 DIAGNOSIS — I5022 Chronic systolic (congestive) heart failure: Secondary | ICD-10-CM | POA: Diagnosis not present

## 2018-05-15 DIAGNOSIS — I110 Hypertensive heart disease with heart failure: Secondary | ICD-10-CM | POA: Diagnosis not present

## 2018-05-15 DIAGNOSIS — N184 Chronic kidney disease, stage 4 (severe): Secondary | ICD-10-CM | POA: Diagnosis not present

## 2018-05-18 DIAGNOSIS — I2510 Atherosclerotic heart disease of native coronary artery without angina pectoris: Secondary | ICD-10-CM | POA: Diagnosis not present

## 2018-05-18 DIAGNOSIS — I5022 Chronic systolic (congestive) heart failure: Secondary | ICD-10-CM | POA: Diagnosis not present

## 2018-05-18 DIAGNOSIS — J449 Chronic obstructive pulmonary disease, unspecified: Secondary | ICD-10-CM | POA: Diagnosis not present

## 2018-05-18 DIAGNOSIS — I110 Hypertensive heart disease with heart failure: Secondary | ICD-10-CM | POA: Diagnosis not present

## 2018-05-18 DIAGNOSIS — I69354 Hemiplegia and hemiparesis following cerebral infarction affecting left non-dominant side: Secondary | ICD-10-CM | POA: Diagnosis not present

## 2018-05-18 DIAGNOSIS — F331 Major depressive disorder, recurrent, moderate: Secondary | ICD-10-CM | POA: Diagnosis not present

## 2018-05-18 DIAGNOSIS — I255 Ischemic cardiomyopathy: Secondary | ICD-10-CM | POA: Diagnosis not present

## 2018-05-18 DIAGNOSIS — M1612 Unilateral primary osteoarthritis, left hip: Secondary | ICD-10-CM | POA: Diagnosis not present

## 2018-05-18 DIAGNOSIS — N184 Chronic kidney disease, stage 4 (severe): Secondary | ICD-10-CM | POA: Diagnosis not present

## 2018-05-18 DIAGNOSIS — E119 Type 2 diabetes mellitus without complications: Secondary | ICD-10-CM | POA: Diagnosis not present

## 2018-05-19 ENCOUNTER — Encounter: Payer: Self-pay | Admitting: Physical Medicine & Rehabilitation

## 2018-05-19 ENCOUNTER — Encounter: Payer: Medicare HMO | Attending: Physical Medicine & Rehabilitation

## 2018-05-19 ENCOUNTER — Other Ambulatory Visit: Payer: Self-pay

## 2018-05-19 ENCOUNTER — Ambulatory Visit (HOSPITAL_BASED_OUTPATIENT_CLINIC_OR_DEPARTMENT_OTHER): Payer: Medicare HMO | Admitting: Physical Medicine & Rehabilitation

## 2018-05-19 VITALS — BP 96/63 | HR 68 | Ht 71.0 in | Wt 276.0 lb

## 2018-05-19 DIAGNOSIS — I110 Hypertensive heart disease with heart failure: Secondary | ICD-10-CM | POA: Diagnosis not present

## 2018-05-19 DIAGNOSIS — J449 Chronic obstructive pulmonary disease, unspecified: Secondary | ICD-10-CM | POA: Diagnosis not present

## 2018-05-19 DIAGNOSIS — I509 Heart failure, unspecified: Secondary | ICD-10-CM | POA: Diagnosis not present

## 2018-05-19 DIAGNOSIS — G4733 Obstructive sleep apnea (adult) (pediatric): Secondary | ICD-10-CM | POA: Diagnosis not present

## 2018-05-19 DIAGNOSIS — E119 Type 2 diabetes mellitus without complications: Secondary | ICD-10-CM | POA: Insufficient documentation

## 2018-05-19 DIAGNOSIS — I2510 Atherosclerotic heart disease of native coronary artery without angina pectoris: Secondary | ICD-10-CM | POA: Diagnosis not present

## 2018-05-19 DIAGNOSIS — M1990 Unspecified osteoarthritis, unspecified site: Secondary | ICD-10-CM | POA: Diagnosis not present

## 2018-05-19 DIAGNOSIS — I5022 Chronic systolic (congestive) heart failure: Secondary | ICD-10-CM | POA: Diagnosis not present

## 2018-05-19 DIAGNOSIS — N184 Chronic kidney disease, stage 4 (severe): Secondary | ICD-10-CM | POA: Diagnosis not present

## 2018-05-19 DIAGNOSIS — F331 Major depressive disorder, recurrent, moderate: Secondary | ICD-10-CM | POA: Diagnosis not present

## 2018-05-19 DIAGNOSIS — E782 Mixed hyperlipidemia: Secondary | ICD-10-CM | POA: Insufficient documentation

## 2018-05-19 DIAGNOSIS — I252 Old myocardial infarction: Secondary | ICD-10-CM | POA: Insufficient documentation

## 2018-05-19 DIAGNOSIS — F419 Anxiety disorder, unspecified: Secondary | ICD-10-CM | POA: Insufficient documentation

## 2018-05-19 DIAGNOSIS — F17210 Nicotine dependence, cigarettes, uncomplicated: Secondary | ICD-10-CM | POA: Insufficient documentation

## 2018-05-19 DIAGNOSIS — I69354 Hemiplegia and hemiparesis following cerebral infarction affecting left non-dominant side: Secondary | ICD-10-CM | POA: Diagnosis not present

## 2018-05-19 DIAGNOSIS — I255 Ischemic cardiomyopathy: Secondary | ICD-10-CM | POA: Diagnosis not present

## 2018-05-19 DIAGNOSIS — G8110 Spastic hemiplegia affecting unspecified side: Secondary | ICD-10-CM | POA: Diagnosis not present

## 2018-05-19 DIAGNOSIS — M1612 Unilateral primary osteoarthritis, left hip: Secondary | ICD-10-CM | POA: Diagnosis not present

## 2018-05-19 NOTE — Progress Notes (Signed)
Subjective:    Patient ID: [name], male    DOB: 11-14-1965, 52 y.o.   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 His spasticity interferes with ambulation with increased hamstring tone.  Also he has difficulty with positioning left upper limb as well as hygiene issues due to spasticity. LUE FDS 100 FDP 100 FPL 100 Biceps 100 Brachiorad 100   LLE Medial Hamstrings 500   Med gastroc 100 Lat Gastro 100 Med soleus 100 Lat soleus 100 Tibialis post 100  The patient feels that since the procedure his upper extremity feels looser and easier to position. He does feel like he has some more buckling at his left knee than prior to the procedure.  In addition he does not feel like there is any significant improvement in the stiffness that he experiences at his foot and ankle on the left side.  He continues to use a AFO as well as a rolling walker for ambulation.  Pain Inventory Average Pain 6 Pain Right Now 4 My pain is constant, sharp and stabbing  In the last 24 hours, has pain interfered with the following? General activity 5 Relation with others 4 Enjoyment of life 7 What TIME of day is your pain at its worst? night Sleep (in general) Poor  Pain is worse with: unsure Pain improves with: rest Relief from Meds: none  Mobility walk with assistance use a cane how many minutes can you walk? 5 ability to climb steps?  yes do you drive?  no use a wheelchair needs help with transfers  Function disabled: date disabled n/a  Neuro/Psych weakness numbness trouble walking depression anxiety  Prior Studies Any changes since last visit?  no  Physicians involved in your care Any changes since last visit?  no   Family History  Problem Relation Age of Onset  . Lung cancer Father   . Heart disease Father   . Heart disease Mother   . Congestive Heart Failure Mother   . Diabetes  Mother   . Hyperlipidemia Mother   . Breast cancer Maternal Aunt    Social History   Socioeconomic History  . Marital status: Legally Separated    Spouse name: Jolayne Haines  . Number of children: 2  . Years of education: Not on file  . Highest education level: 12th grade  Occupational History  . Occupation: disabled  Social Needs  . Financial resource strain: Not on file  . Food insecurity:    Worry: Not on file    Inability: Not on file  . Transportation needs:    Medical: Not on file    Non-medical: Not on file  Tobacco Use  . Smoking status: Current Every Day Smoker    Packs/day: 1.50    Years: 30.00    Pack years: 45.00    Types: Cigarettes    Start date: 06/08/1982  . Smokeless tobacco: Never Used  . Tobacco comment: 1 pack per day 03/04/2018  Substance and Sexual Activity  . Alcohol use: No  . Drug use: No  . Sexual activity: Yes    Birth control/protection: None  Lifestyle  . Physical activity:    Days per week: Not on file    Minutes per session: Not on file  . Stress: Not on file  Relationships  . Social connections:    Talks on phone: Not on file    Gets together: Not on file  Attends religious service: Not on file    Active member of club or organization: Not on file    Attends meetings of clubs or organizations: Not on file    Relationship status: Not on file  Other Topics Concern  . Not on file  Social History Narrative   Disabled since age 61 lives with wife and children      Patient is right-handed. He is married, lives in a 1 story house, has a ramp to enter. Drinks 64oz of Mtn. Dew a day. Prior to CVA was walking daily. 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   Past Surgical History:  Procedure Laterality Date  . CHOLECYSTECTOMY    . CORONARY ARTERY BYPASS GRAFT  2010   LIMA to LAD, SVG to diagonal, SVG to OM1 and OM 2, SVG to RCA  . DENTAL SURGERY  2003  . GASTRIC BYPASS  2010  . HERNIA REPAIR  2011, 2012  . INCISIONAL  HERNIA REPAIR N/A 07/23/2013   Procedure: HERNIA REPAIR INCISIONAL WITH MESH;  Surgeon: Jamesetta So, MD;  Location: [facility];  Service: General;  Laterality: N/A;  . INSERTION OF MESH N/A 07/23/2013   Procedure: INSERTION OF MESH;  Surgeon: Jamesetta So, MD;  Location: [facility];  Service: General;  Laterality: N/A;  . LEFT HEART CATHETERIZATION WITH CORONARY/GRAFT ANGIOGRAM N/A 11/01/2013   Procedure: LEFT HEART CATHETERIZATION WITH Beatrix Fetters;  Surgeon: Blane Ohara, MD;  Location: [facility] CATH [facility];  Service: Cardiovascular;  Laterality: N/A;  . TOE AMPUTATION  1998   right 1st and 2nd toe  . TONSILECTOMY, ADENOIDECTOMY, BILATERAL MYRINGOTOMY AND TUBES    . TONSILLECTOMY    . VENTRAL HERNIA REPAIR N/A 10/28/2012   Procedure: LAPAROSCOPIC VENTRAL HERNIA;  Surgeon: Donato Heinz, MD;  Location: [facility];  Service: General;  Laterality: N/A;   Past Medical History:  Diagnosis Date  . Anaphylaxis    IGE mediated  . Anxiety   . Arthritis   . Cardiomyopathy ([place])   . CHF (congestive heart failure) ([place])   . COPD (chronic obstructive pulmonary disease) ([place])   . Coronary atherosclerosis of native coronary artery    Multivessel status post CABG 2010  . Depression   . Essential hypertension, benign   . History of CVA (cerebrovascular accident) 01/2012   Right posterior frontal cortical and subcortical brain by MRI, no hemorrhage. Carotid Dopplers showed only 1-50% bilateral ICA stenoses. Echocardiogram showed LVEF 50%, no major valvular abnormalities.  . Mixed hyperlipidemia   . Myocardial infarction ([place]) 2010  . OSA (obstructive sleep apnea)   . Stroke ([place])   . Type 2 diabetes mellitus ([facility])    BP 96/63   Pulse 68   Ht 5\' 11"  (1.803 m)   Wt 276 lb (125.2 kg)   SpO2 98%   BMI 38.49 kg/m   Opioid Risk Score:   Fall Risk Score:  `1  Depression screen PHQ 2/9  Depression screen [facility] 2/9 05/19/2018 09/29/2017  Decreased Interest 1 1  Down, Depressed, Hopeless 1 3  PHQ - 2  Score 2 4  Altered sleeping - 3  Tired, decreased energy - 2  Change in appetite - 3  Feeling bad or failure about yourself  - 3  Trouble concentrating - 3  Moving slowly or fidgety/restless - 2  Suicidal thoughts - 0  PHQ-9 Score - 20  Difficult doing work/chores - Somewhat difficult     Review of Systems  Constitutional: Negative.  HENT: Negative.   Eyes: Negative.   Respiratory: Positive for cough and shortness of breath.   Cardiovascular: Negative.   Gastrointestinal: Negative.   Endocrine: Negative.   Genitourinary: Negative.   Musculoskeletal: Negative.   Skin: Negative.   Allergic/Immunologic: Negative.   Neurological: Negative.   Hematological: Negative.   Psychiatric/Behavioral: Negative.   All other systems reviewed and are negative.      Objective:   Physical Exam  Constitutional: He appears well-developed and well-nourished.  HENT:  Head: Normocephalic and atraumatic.  Nursing note and vitals reviewed. Speech with dysarthria Motor strength is 2- at the left deltoid bicep 0 at the finger flexors and extensors. He has 3- left hip flexor and knee extensors 0 at the ankle dorsiflexor plantar flexor Tone MAS 1 at the finger flexors MAS 1 at the wrist flexors MAS 0 at the thumb flexor MAS 1 at the elbow flexor but does have some end range pain with extension MAS 2 at the left hamstrings/knee flexors MAS 4 at the left ankle plantar flexors         Assessment & Plan:  1.  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  He is 6 years post infarct.  He has had good relief of his upper extremity spasticity after Botox injection.  Also his hamstring spasticity has improved but at this point he is starting to hyperextend due to decreased knee flexion.  We discussed that reducing his Dysport dose at the left hamstring to 300 units should help with this.  We also discussed that increasing the tibialis posterior dose to 300 units may also help with his equinovarus  spasticity in the left lower limb.  We also discussed that there may be some element of contracture at the foot and ankle plantar flexors of the left foot.

## 2018-05-20 DIAGNOSIS — I2510 Atherosclerotic heart disease of native coronary artery without angina pectoris: Secondary | ICD-10-CM | POA: Diagnosis not present

## 2018-05-20 DIAGNOSIS — I69354 Hemiplegia and hemiparesis following cerebral infarction affecting left non-dominant side: Secondary | ICD-10-CM | POA: Diagnosis not present

## 2018-05-20 DIAGNOSIS — E119 Type 2 diabetes mellitus without complications: Secondary | ICD-10-CM | POA: Diagnosis not present

## 2018-05-20 DIAGNOSIS — I255 Ischemic cardiomyopathy: Secondary | ICD-10-CM | POA: Diagnosis not present

## 2018-05-21 DIAGNOSIS — I255 Ischemic cardiomyopathy: Secondary | ICD-10-CM | POA: Diagnosis not present

## 2018-05-21 DIAGNOSIS — I2510 Atherosclerotic heart disease of native coronary artery without angina pectoris: Secondary | ICD-10-CM | POA: Diagnosis not present

## 2018-05-21 DIAGNOSIS — N184 Chronic kidney disease, stage 4 (severe): Secondary | ICD-10-CM | POA: Diagnosis not present

## 2018-05-21 DIAGNOSIS — I5022 Chronic systolic (congestive) heart failure: Secondary | ICD-10-CM | POA: Diagnosis not present

## 2018-05-21 DIAGNOSIS — E119 Type 2 diabetes mellitus without complications: Secondary | ICD-10-CM | POA: Diagnosis not present

## 2018-05-21 DIAGNOSIS — J449 Chronic obstructive pulmonary disease, unspecified: Secondary | ICD-10-CM | POA: Diagnosis not present

## 2018-05-21 DIAGNOSIS — M1612 Unilateral primary osteoarthritis, left hip: Secondary | ICD-10-CM | POA: Diagnosis not present

## 2018-05-21 DIAGNOSIS — F331 Major depressive disorder, recurrent, moderate: Secondary | ICD-10-CM | POA: Diagnosis not present

## 2018-05-21 DIAGNOSIS — I110 Hypertensive heart disease with heart failure: Secondary | ICD-10-CM | POA: Diagnosis not present

## 2018-05-21 DIAGNOSIS — I69354 Hemiplegia and hemiparesis following cerebral infarction affecting left non-dominant side: Secondary | ICD-10-CM | POA: Diagnosis not present

## 2018-05-22 DIAGNOSIS — I255 Ischemic cardiomyopathy: Secondary | ICD-10-CM | POA: Diagnosis not present

## 2018-05-22 DIAGNOSIS — E119 Type 2 diabetes mellitus without complications: Secondary | ICD-10-CM | POA: Diagnosis not present

## 2018-05-22 DIAGNOSIS — I2510 Atherosclerotic heart disease of native coronary artery without angina pectoris: Secondary | ICD-10-CM | POA: Diagnosis not present

## 2018-05-22 DIAGNOSIS — I69354 Hemiplegia and hemiparesis following cerebral infarction affecting left non-dominant side: Secondary | ICD-10-CM | POA: Diagnosis not present

## 2018-05-25 DIAGNOSIS — R05 Cough: Secondary | ICD-10-CM | POA: Diagnosis not present

## 2018-05-25 DIAGNOSIS — I2510 Atherosclerotic heart disease of native coronary artery without angina pectoris: Secondary | ICD-10-CM | POA: Diagnosis not present

## 2018-05-25 DIAGNOSIS — J441 Chronic obstructive pulmonary disease with (acute) exacerbation: Secondary | ICD-10-CM | POA: Diagnosis not present

## 2018-05-25 DIAGNOSIS — I69354 Hemiplegia and hemiparesis following cerebral infarction affecting left non-dominant side: Secondary | ICD-10-CM | POA: Diagnosis not present

## 2018-05-25 DIAGNOSIS — I509 Heart failure, unspecified: Secondary | ICD-10-CM | POA: Diagnosis not present

## 2018-05-25 DIAGNOSIS — I255 Ischemic cardiomyopathy: Secondary | ICD-10-CM | POA: Diagnosis not present

## 2018-05-25 DIAGNOSIS — Z79899 Other long term (current) drug therapy: Secondary | ICD-10-CM | POA: Diagnosis not present

## 2018-05-25 DIAGNOSIS — E119 Type 2 diabetes mellitus without complications: Secondary | ICD-10-CM | POA: Diagnosis not present

## 2018-05-25 DIAGNOSIS — Z7902 Long term (current) use of antithrombotics/antiplatelets: Secondary | ICD-10-CM | POA: Diagnosis not present

## 2018-05-25 DIAGNOSIS — Z7982 Long term (current) use of aspirin: Secondary | ICD-10-CM | POA: Diagnosis not present

## 2018-05-25 DIAGNOSIS — Z720 Tobacco use: Secondary | ICD-10-CM | POA: Diagnosis not present

## 2018-05-25 DIAGNOSIS — I110 Hypertensive heart disease with heart failure: Secondary | ICD-10-CM | POA: Diagnosis not present

## 2018-05-25 DIAGNOSIS — Z8673 Personal history of transient ischemic attack (TIA), and cerebral infarction without residual deficits: Secondary | ICD-10-CM | POA: Diagnosis not present

## 2018-05-26 DIAGNOSIS — I255 Ischemic cardiomyopathy: Secondary | ICD-10-CM | POA: Diagnosis not present

## 2018-05-26 DIAGNOSIS — I110 Hypertensive heart disease with heart failure: Secondary | ICD-10-CM | POA: Diagnosis not present

## 2018-05-26 DIAGNOSIS — I5022 Chronic systolic (congestive) heart failure: Secondary | ICD-10-CM | POA: Diagnosis not present

## 2018-05-26 DIAGNOSIS — I69354 Hemiplegia and hemiparesis following cerebral infarction affecting left non-dominant side: Secondary | ICD-10-CM | POA: Diagnosis not present

## 2018-05-26 DIAGNOSIS — J449 Chronic obstructive pulmonary disease, unspecified: Secondary | ICD-10-CM | POA: Diagnosis not present

## 2018-05-26 DIAGNOSIS — N184 Chronic kidney disease, stage 4 (severe): Secondary | ICD-10-CM | POA: Diagnosis not present

## 2018-05-26 DIAGNOSIS — F331 Major depressive disorder, recurrent, moderate: Secondary | ICD-10-CM | POA: Diagnosis not present

## 2018-05-26 DIAGNOSIS — M1612 Unilateral primary osteoarthritis, left hip: Secondary | ICD-10-CM | POA: Diagnosis not present

## 2018-05-26 DIAGNOSIS — I2510 Atherosclerotic heart disease of native coronary artery without angina pectoris: Secondary | ICD-10-CM | POA: Diagnosis not present

## 2018-05-26 DIAGNOSIS — E119 Type 2 diabetes mellitus without complications: Secondary | ICD-10-CM | POA: Diagnosis not present

## 2018-05-27 DIAGNOSIS — I69354 Hemiplegia and hemiparesis following cerebral infarction affecting left non-dominant side: Secondary | ICD-10-CM | POA: Diagnosis not present

## 2018-05-27 DIAGNOSIS — I2510 Atherosclerotic heart disease of native coronary artery without angina pectoris: Secondary | ICD-10-CM | POA: Diagnosis not present

## 2018-05-27 DIAGNOSIS — I255 Ischemic cardiomyopathy: Secondary | ICD-10-CM | POA: Diagnosis not present

## 2018-05-27 DIAGNOSIS — E119 Type 2 diabetes mellitus without complications: Secondary | ICD-10-CM | POA: Diagnosis not present

## 2018-05-28 DIAGNOSIS — I2510 Atherosclerotic heart disease of native coronary artery without angina pectoris: Secondary | ICD-10-CM | POA: Diagnosis not present

## 2018-05-28 DIAGNOSIS — F331 Major depressive disorder, recurrent, moderate: Secondary | ICD-10-CM | POA: Diagnosis not present

## 2018-05-28 DIAGNOSIS — I255 Ischemic cardiomyopathy: Secondary | ICD-10-CM | POA: Diagnosis not present

## 2018-05-28 DIAGNOSIS — E119 Type 2 diabetes mellitus without complications: Secondary | ICD-10-CM | POA: Diagnosis not present

## 2018-05-28 DIAGNOSIS — M1612 Unilateral primary osteoarthritis, left hip: Secondary | ICD-10-CM | POA: Diagnosis not present

## 2018-05-28 DIAGNOSIS — J449 Chronic obstructive pulmonary disease, unspecified: Secondary | ICD-10-CM | POA: Diagnosis not present

## 2018-05-28 DIAGNOSIS — N184 Chronic kidney disease, stage 4 (severe): Secondary | ICD-10-CM | POA: Diagnosis not present

## 2018-05-28 DIAGNOSIS — I69354 Hemiplegia and hemiparesis following cerebral infarction affecting left non-dominant side: Secondary | ICD-10-CM | POA: Diagnosis not present

## 2018-05-28 DIAGNOSIS — I110 Hypertensive heart disease with heart failure: Secondary | ICD-10-CM | POA: Diagnosis not present

## 2018-05-28 DIAGNOSIS — I5022 Chronic systolic (congestive) heart failure: Secondary | ICD-10-CM | POA: Diagnosis not present

## 2018-05-29 DIAGNOSIS — I255 Ischemic cardiomyopathy: Secondary | ICD-10-CM | POA: Diagnosis not present

## 2018-05-29 DIAGNOSIS — I69354 Hemiplegia and hemiparesis following cerebral infarction affecting left non-dominant side: Secondary | ICD-10-CM | POA: Diagnosis not present

## 2018-05-29 DIAGNOSIS — I5022 Chronic systolic (congestive) heart failure: Secondary | ICD-10-CM | POA: Diagnosis not present

## 2018-05-29 DIAGNOSIS — N184 Chronic kidney disease, stage 4 (severe): Secondary | ICD-10-CM | POA: Diagnosis not present

## 2018-05-29 DIAGNOSIS — J449 Chronic obstructive pulmonary disease, unspecified: Secondary | ICD-10-CM | POA: Diagnosis not present

## 2018-05-29 DIAGNOSIS — I2510 Atherosclerotic heart disease of native coronary artery without angina pectoris: Secondary | ICD-10-CM | POA: Diagnosis not present

## 2018-05-29 DIAGNOSIS — E119 Type 2 diabetes mellitus without complications: Secondary | ICD-10-CM | POA: Diagnosis not present

## 2018-05-29 DIAGNOSIS — M1612 Unilateral primary osteoarthritis, left hip: Secondary | ICD-10-CM | POA: Diagnosis not present

## 2018-05-29 DIAGNOSIS — I110 Hypertensive heart disease with heart failure: Secondary | ICD-10-CM | POA: Diagnosis not present

## 2018-05-29 DIAGNOSIS — F331 Major depressive disorder, recurrent, moderate: Secondary | ICD-10-CM | POA: Diagnosis not present

## 2018-05-31 DIAGNOSIS — I5022 Chronic systolic (congestive) heart failure: Secondary | ICD-10-CM | POA: Diagnosis not present

## 2018-05-31 DIAGNOSIS — E119 Type 2 diabetes mellitus without complications: Secondary | ICD-10-CM | POA: Diagnosis not present

## 2018-05-31 DIAGNOSIS — I110 Hypertensive heart disease with heart failure: Secondary | ICD-10-CM | POA: Diagnosis not present

## 2018-05-31 DIAGNOSIS — J449 Chronic obstructive pulmonary disease, unspecified: Secondary | ICD-10-CM | POA: Diagnosis not present

## 2018-05-31 DIAGNOSIS — I2510 Atherosclerotic heart disease of native coronary artery without angina pectoris: Secondary | ICD-10-CM | POA: Diagnosis not present

## 2018-05-31 DIAGNOSIS — I69354 Hemiplegia and hemiparesis following cerebral infarction affecting left non-dominant side: Secondary | ICD-10-CM | POA: Diagnosis not present

## 2018-05-31 DIAGNOSIS — M1612 Unilateral primary osteoarthritis, left hip: Secondary | ICD-10-CM | POA: Diagnosis not present

## 2018-05-31 DIAGNOSIS — F331 Major depressive disorder, recurrent, moderate: Secondary | ICD-10-CM | POA: Diagnosis not present

## 2018-05-31 DIAGNOSIS — N184 Chronic kidney disease, stage 4 (severe): Secondary | ICD-10-CM | POA: Diagnosis not present

## 2018-06-01 DIAGNOSIS — I255 Ischemic cardiomyopathy: Secondary | ICD-10-CM | POA: Diagnosis not present

## 2018-06-01 DIAGNOSIS — I69354 Hemiplegia and hemiparesis following cerebral infarction affecting left non-dominant side: Secondary | ICD-10-CM | POA: Diagnosis not present

## 2018-06-01 DIAGNOSIS — E119 Type 2 diabetes mellitus without complications: Secondary | ICD-10-CM | POA: Diagnosis not present

## 2018-06-01 DIAGNOSIS — I2510 Atherosclerotic heart disease of native coronary artery without angina pectoris: Secondary | ICD-10-CM | POA: Diagnosis not present

## 2018-06-02 DIAGNOSIS — I69354 Hemiplegia and hemiparesis following cerebral infarction affecting left non-dominant side: Secondary | ICD-10-CM | POA: Diagnosis not present

## 2018-06-02 DIAGNOSIS — E119 Type 2 diabetes mellitus without complications: Secondary | ICD-10-CM | POA: Diagnosis not present

## 2018-06-02 DIAGNOSIS — I110 Hypertensive heart disease with heart failure: Secondary | ICD-10-CM | POA: Diagnosis not present

## 2018-06-02 DIAGNOSIS — F331 Major depressive disorder, recurrent, moderate: Secondary | ICD-10-CM | POA: Diagnosis not present

## 2018-06-02 DIAGNOSIS — N184 Chronic kidney disease, stage 4 (severe): Secondary | ICD-10-CM | POA: Diagnosis not present

## 2018-06-02 DIAGNOSIS — I255 Ischemic cardiomyopathy: Secondary | ICD-10-CM | POA: Diagnosis not present

## 2018-06-02 DIAGNOSIS — M1612 Unilateral primary osteoarthritis, left hip: Secondary | ICD-10-CM | POA: Diagnosis not present

## 2018-06-02 DIAGNOSIS — J449 Chronic obstructive pulmonary disease, unspecified: Secondary | ICD-10-CM | POA: Diagnosis not present

## 2018-06-02 DIAGNOSIS — I5022 Chronic systolic (congestive) heart failure: Secondary | ICD-10-CM | POA: Diagnosis not present

## 2018-06-02 DIAGNOSIS — I2510 Atherosclerotic heart disease of native coronary artery without angina pectoris: Secondary | ICD-10-CM | POA: Diagnosis not present

## 2018-06-03 DIAGNOSIS — E119 Type 2 diabetes mellitus without complications: Secondary | ICD-10-CM | POA: Diagnosis not present

## 2018-06-03 DIAGNOSIS — F39 Unspecified mood [affective] disorder: Secondary | ICD-10-CM | POA: Diagnosis not present

## 2018-06-03 DIAGNOSIS — I69354 Hemiplegia and hemiparesis following cerebral infarction affecting left non-dominant side: Secondary | ICD-10-CM | POA: Diagnosis not present

## 2018-06-03 DIAGNOSIS — I2510 Atherosclerotic heart disease of native coronary artery without angina pectoris: Secondary | ICD-10-CM | POA: Diagnosis not present

## 2018-06-03 DIAGNOSIS — I255 Ischemic cardiomyopathy: Secondary | ICD-10-CM | POA: Diagnosis not present

## 2018-06-03 DIAGNOSIS — Z79899 Other long term (current) drug therapy: Secondary | ICD-10-CM | POA: Diagnosis not present

## 2018-06-03 DIAGNOSIS — F419 Anxiety disorder, unspecified: Secondary | ICD-10-CM | POA: Diagnosis not present

## 2018-06-03 DIAGNOSIS — F329 Major depressive disorder, single episode, unspecified: Secondary | ICD-10-CM | POA: Diagnosis not present

## 2018-06-03 DIAGNOSIS — F17200 Nicotine dependence, unspecified, uncomplicated: Secondary | ICD-10-CM | POA: Diagnosis not present

## 2018-06-03 DIAGNOSIS — Z87891 Personal history of nicotine dependence: Secondary | ICD-10-CM | POA: Diagnosis not present

## 2018-06-04 DIAGNOSIS — N184 Chronic kidney disease, stage 4 (severe): Secondary | ICD-10-CM | POA: Diagnosis not present

## 2018-06-04 DIAGNOSIS — I2510 Atherosclerotic heart disease of native coronary artery without angina pectoris: Secondary | ICD-10-CM | POA: Diagnosis not present

## 2018-06-04 DIAGNOSIS — I110 Hypertensive heart disease with heart failure: Secondary | ICD-10-CM | POA: Diagnosis not present

## 2018-06-04 DIAGNOSIS — I255 Ischemic cardiomyopathy: Secondary | ICD-10-CM | POA: Diagnosis not present

## 2018-06-04 DIAGNOSIS — I5022 Chronic systolic (congestive) heart failure: Secondary | ICD-10-CM | POA: Diagnosis not present

## 2018-06-04 DIAGNOSIS — J449 Chronic obstructive pulmonary disease, unspecified: Secondary | ICD-10-CM | POA: Diagnosis not present

## 2018-06-04 DIAGNOSIS — F331 Major depressive disorder, recurrent, moderate: Secondary | ICD-10-CM | POA: Diagnosis not present

## 2018-06-04 DIAGNOSIS — E119 Type 2 diabetes mellitus without complications: Secondary | ICD-10-CM | POA: Diagnosis not present

## 2018-06-04 DIAGNOSIS — I69398 Other sequelae of cerebral infarction: Secondary | ICD-10-CM | POA: Diagnosis not present

## 2018-06-04 DIAGNOSIS — I69354 Hemiplegia and hemiparesis following cerebral infarction affecting left non-dominant side: Secondary | ICD-10-CM | POA: Diagnosis not present

## 2018-06-04 DIAGNOSIS — M1612 Unilateral primary osteoarthritis, left hip: Secondary | ICD-10-CM | POA: Diagnosis not present

## 2018-06-05 DIAGNOSIS — E119 Type 2 diabetes mellitus without complications: Secondary | ICD-10-CM | POA: Diagnosis not present

## 2018-06-05 DIAGNOSIS — I2510 Atherosclerotic heart disease of native coronary artery without angina pectoris: Secondary | ICD-10-CM | POA: Diagnosis not present

## 2018-06-05 DIAGNOSIS — I255 Ischemic cardiomyopathy: Secondary | ICD-10-CM | POA: Diagnosis not present

## 2018-06-05 DIAGNOSIS — I69354 Hemiplegia and hemiparesis following cerebral infarction affecting left non-dominant side: Secondary | ICD-10-CM | POA: Diagnosis not present

## 2018-06-08 DIAGNOSIS — I69354 Hemiplegia and hemiparesis following cerebral infarction affecting left non-dominant side: Secondary | ICD-10-CM | POA: Diagnosis not present

## 2018-06-08 DIAGNOSIS — I2510 Atherosclerotic heart disease of native coronary artery without angina pectoris: Secondary | ICD-10-CM | POA: Diagnosis not present

## 2018-06-08 DIAGNOSIS — I255 Ischemic cardiomyopathy: Secondary | ICD-10-CM | POA: Diagnosis not present

## 2018-06-08 DIAGNOSIS — E119 Type 2 diabetes mellitus without complications: Secondary | ICD-10-CM | POA: Diagnosis not present

## 2018-06-09 DIAGNOSIS — M1612 Unilateral primary osteoarthritis, left hip: Secondary | ICD-10-CM | POA: Diagnosis not present

## 2018-06-09 DIAGNOSIS — I255 Ischemic cardiomyopathy: Secondary | ICD-10-CM | POA: Diagnosis not present

## 2018-06-09 DIAGNOSIS — F331 Major depressive disorder, recurrent, moderate: Secondary | ICD-10-CM | POA: Diagnosis not present

## 2018-06-09 DIAGNOSIS — I2510 Atherosclerotic heart disease of native coronary artery without angina pectoris: Secondary | ICD-10-CM | POA: Diagnosis not present

## 2018-06-09 DIAGNOSIS — E119 Type 2 diabetes mellitus without complications: Secondary | ICD-10-CM | POA: Diagnosis not present

## 2018-06-09 DIAGNOSIS — Z79899 Other long term (current) drug therapy: Secondary | ICD-10-CM | POA: Diagnosis not present

## 2018-06-09 DIAGNOSIS — G894 Chronic pain syndrome: Secondary | ICD-10-CM | POA: Diagnosis not present

## 2018-06-09 DIAGNOSIS — I69354 Hemiplegia and hemiparesis following cerebral infarction affecting left non-dominant side: Secondary | ICD-10-CM | POA: Diagnosis not present

## 2018-06-09 DIAGNOSIS — J449 Chronic obstructive pulmonary disease, unspecified: Secondary | ICD-10-CM | POA: Diagnosis not present

## 2018-06-09 DIAGNOSIS — I639 Cerebral infarction, unspecified: Secondary | ICD-10-CM | POA: Diagnosis not present

## 2018-06-09 DIAGNOSIS — I110 Hypertensive heart disease with heart failure: Secondary | ICD-10-CM | POA: Diagnosis not present

## 2018-06-09 DIAGNOSIS — I5022 Chronic systolic (congestive) heart failure: Secondary | ICD-10-CM | POA: Diagnosis not present

## 2018-06-09 DIAGNOSIS — F1120 Opioid dependence, uncomplicated: Secondary | ICD-10-CM | POA: Diagnosis not present

## 2018-06-09 DIAGNOSIS — N184 Chronic kidney disease, stage 4 (severe): Secondary | ICD-10-CM | POA: Diagnosis not present

## 2018-06-10 DIAGNOSIS — I2510 Atherosclerotic heart disease of native coronary artery without angina pectoris: Secondary | ICD-10-CM | POA: Diagnosis not present

## 2018-06-10 DIAGNOSIS — E119 Type 2 diabetes mellitus without complications: Secondary | ICD-10-CM | POA: Diagnosis not present

## 2018-06-10 DIAGNOSIS — I69398 Other sequelae of cerebral infarction: Secondary | ICD-10-CM | POA: Diagnosis not present

## 2018-06-10 DIAGNOSIS — N184 Chronic kidney disease, stage 4 (severe): Secondary | ICD-10-CM | POA: Diagnosis not present

## 2018-06-10 DIAGNOSIS — I5022 Chronic systolic (congestive) heart failure: Secondary | ICD-10-CM | POA: Diagnosis not present

## 2018-06-10 DIAGNOSIS — J449 Chronic obstructive pulmonary disease, unspecified: Secondary | ICD-10-CM | POA: Diagnosis not present

## 2018-06-10 DIAGNOSIS — F331 Major depressive disorder, recurrent, moderate: Secondary | ICD-10-CM | POA: Diagnosis not present

## 2018-06-10 DIAGNOSIS — M1612 Unilateral primary osteoarthritis, left hip: Secondary | ICD-10-CM | POA: Diagnosis not present

## 2018-06-10 DIAGNOSIS — I255 Ischemic cardiomyopathy: Secondary | ICD-10-CM | POA: Diagnosis not present

## 2018-06-10 DIAGNOSIS — I110 Hypertensive heart disease with heart failure: Secondary | ICD-10-CM | POA: Diagnosis not present

## 2018-06-10 DIAGNOSIS — I69354 Hemiplegia and hemiparesis following cerebral infarction affecting left non-dominant side: Secondary | ICD-10-CM | POA: Diagnosis not present

## 2018-06-12 DIAGNOSIS — I69354 Hemiplegia and hemiparesis following cerebral infarction affecting left non-dominant side: Secondary | ICD-10-CM | POA: Diagnosis not present

## 2018-06-12 DIAGNOSIS — I5022 Chronic systolic (congestive) heart failure: Secondary | ICD-10-CM | POA: Diagnosis not present

## 2018-06-12 DIAGNOSIS — J449 Chronic obstructive pulmonary disease, unspecified: Secondary | ICD-10-CM | POA: Diagnosis not present

## 2018-06-12 DIAGNOSIS — I2510 Atherosclerotic heart disease of native coronary artery without angina pectoris: Secondary | ICD-10-CM | POA: Diagnosis not present

## 2018-06-12 DIAGNOSIS — E119 Type 2 diabetes mellitus without complications: Secondary | ICD-10-CM | POA: Diagnosis not present

## 2018-06-12 DIAGNOSIS — N184 Chronic kidney disease, stage 4 (severe): Secondary | ICD-10-CM | POA: Diagnosis not present

## 2018-06-12 DIAGNOSIS — F331 Major depressive disorder, recurrent, moderate: Secondary | ICD-10-CM | POA: Diagnosis not present

## 2018-06-12 DIAGNOSIS — M1612 Unilateral primary osteoarthritis, left hip: Secondary | ICD-10-CM | POA: Diagnosis not present

## 2018-06-12 DIAGNOSIS — I110 Hypertensive heart disease with heart failure: Secondary | ICD-10-CM | POA: Diagnosis not present

## 2018-06-14 DIAGNOSIS — G4733 Obstructive sleep apnea (adult) (pediatric): Secondary | ICD-10-CM | POA: Diagnosis not present

## 2018-06-14 DIAGNOSIS — I69398 Other sequelae of cerebral infarction: Secondary | ICD-10-CM | POA: Diagnosis not present

## 2018-06-14 DIAGNOSIS — I2510 Atherosclerotic heart disease of native coronary artery without angina pectoris: Secondary | ICD-10-CM | POA: Diagnosis not present

## 2018-06-14 DIAGNOSIS — J449 Chronic obstructive pulmonary disease, unspecified: Secondary | ICD-10-CM | POA: Diagnosis not present

## 2018-06-15 DIAGNOSIS — I110 Hypertensive heart disease with heart failure: Secondary | ICD-10-CM | POA: Diagnosis not present

## 2018-06-15 DIAGNOSIS — M1612 Unilateral primary osteoarthritis, left hip: Secondary | ICD-10-CM | POA: Diagnosis not present

## 2018-06-15 DIAGNOSIS — E119 Type 2 diabetes mellitus without complications: Secondary | ICD-10-CM | POA: Diagnosis not present

## 2018-06-15 DIAGNOSIS — I2510 Atherosclerotic heart disease of native coronary artery without angina pectoris: Secondary | ICD-10-CM | POA: Diagnosis not present

## 2018-06-15 DIAGNOSIS — I69354 Hemiplegia and hemiparesis following cerebral infarction affecting left non-dominant side: Secondary | ICD-10-CM | POA: Diagnosis not present

## 2018-06-15 DIAGNOSIS — N184 Chronic kidney disease, stage 4 (severe): Secondary | ICD-10-CM | POA: Diagnosis not present

## 2018-06-15 DIAGNOSIS — I5022 Chronic systolic (congestive) heart failure: Secondary | ICD-10-CM | POA: Diagnosis not present

## 2018-06-15 DIAGNOSIS — J449 Chronic obstructive pulmonary disease, unspecified: Secondary | ICD-10-CM | POA: Diagnosis not present

## 2018-06-15 DIAGNOSIS — F331 Major depressive disorder, recurrent, moderate: Secondary | ICD-10-CM | POA: Diagnosis not present

## 2018-06-16 DIAGNOSIS — N184 Chronic kidney disease, stage 4 (severe): Secondary | ICD-10-CM | POA: Diagnosis not present

## 2018-06-16 DIAGNOSIS — I69354 Hemiplegia and hemiparesis following cerebral infarction affecting left non-dominant side: Secondary | ICD-10-CM | POA: Diagnosis not present

## 2018-06-16 DIAGNOSIS — F331 Major depressive disorder, recurrent, moderate: Secondary | ICD-10-CM | POA: Diagnosis not present

## 2018-06-16 DIAGNOSIS — I2510 Atherosclerotic heart disease of native coronary artery without angina pectoris: Secondary | ICD-10-CM | POA: Diagnosis not present

## 2018-06-16 DIAGNOSIS — M1612 Unilateral primary osteoarthritis, left hip: Secondary | ICD-10-CM | POA: Diagnosis not present

## 2018-06-16 DIAGNOSIS — E119 Type 2 diabetes mellitus without complications: Secondary | ICD-10-CM | POA: Diagnosis not present

## 2018-06-16 DIAGNOSIS — I5022 Chronic systolic (congestive) heart failure: Secondary | ICD-10-CM | POA: Diagnosis not present

## 2018-06-16 DIAGNOSIS — J449 Chronic obstructive pulmonary disease, unspecified: Secondary | ICD-10-CM | POA: Diagnosis not present

## 2018-06-16 DIAGNOSIS — I110 Hypertensive heart disease with heart failure: Secondary | ICD-10-CM | POA: Diagnosis not present

## 2018-06-17 DIAGNOSIS — J449 Chronic obstructive pulmonary disease, unspecified: Secondary | ICD-10-CM | POA: Diagnosis not present

## 2018-06-17 DIAGNOSIS — I69354 Hemiplegia and hemiparesis following cerebral infarction affecting left non-dominant side: Secondary | ICD-10-CM | POA: Diagnosis not present

## 2018-06-17 DIAGNOSIS — E119 Type 2 diabetes mellitus without complications: Secondary | ICD-10-CM | POA: Diagnosis not present

## 2018-06-17 DIAGNOSIS — I5022 Chronic systolic (congestive) heart failure: Secondary | ICD-10-CM | POA: Diagnosis not present

## 2018-06-17 DIAGNOSIS — I2510 Atherosclerotic heart disease of native coronary artery without angina pectoris: Secondary | ICD-10-CM | POA: Diagnosis not present

## 2018-06-17 DIAGNOSIS — F331 Major depressive disorder, recurrent, moderate: Secondary | ICD-10-CM | POA: Diagnosis not present

## 2018-06-17 DIAGNOSIS — I110 Hypertensive heart disease with heart failure: Secondary | ICD-10-CM | POA: Diagnosis not present

## 2018-06-17 DIAGNOSIS — M1612 Unilateral primary osteoarthritis, left hip: Secondary | ICD-10-CM | POA: Diagnosis not present

## 2018-06-17 DIAGNOSIS — N184 Chronic kidney disease, stage 4 (severe): Secondary | ICD-10-CM | POA: Diagnosis not present

## 2018-06-18 DIAGNOSIS — M1612 Unilateral primary osteoarthritis, left hip: Secondary | ICD-10-CM | POA: Diagnosis not present

## 2018-06-18 DIAGNOSIS — F331 Major depressive disorder, recurrent, moderate: Secondary | ICD-10-CM | POA: Diagnosis not present

## 2018-06-18 DIAGNOSIS — I2510 Atherosclerotic heart disease of native coronary artery without angina pectoris: Secondary | ICD-10-CM | POA: Diagnosis not present

## 2018-06-18 DIAGNOSIS — N184 Chronic kidney disease, stage 4 (severe): Secondary | ICD-10-CM | POA: Diagnosis not present

## 2018-06-18 DIAGNOSIS — I110 Hypertensive heart disease with heart failure: Secondary | ICD-10-CM | POA: Diagnosis not present

## 2018-06-18 DIAGNOSIS — I5022 Chronic systolic (congestive) heart failure: Secondary | ICD-10-CM | POA: Diagnosis not present

## 2018-06-18 DIAGNOSIS — I69354 Hemiplegia and hemiparesis following cerebral infarction affecting left non-dominant side: Secondary | ICD-10-CM | POA: Diagnosis not present

## 2018-06-18 DIAGNOSIS — E119 Type 2 diabetes mellitus without complications: Secondary | ICD-10-CM | POA: Diagnosis not present

## 2018-06-18 DIAGNOSIS — J449 Chronic obstructive pulmonary disease, unspecified: Secondary | ICD-10-CM | POA: Diagnosis not present

## 2018-06-19 DIAGNOSIS — I5022 Chronic systolic (congestive) heart failure: Secondary | ICD-10-CM | POA: Diagnosis not present

## 2018-06-19 DIAGNOSIS — J449 Chronic obstructive pulmonary disease, unspecified: Secondary | ICD-10-CM | POA: Diagnosis not present

## 2018-06-19 DIAGNOSIS — F331 Major depressive disorder, recurrent, moderate: Secondary | ICD-10-CM | POA: Diagnosis not present

## 2018-06-19 DIAGNOSIS — N184 Chronic kidney disease, stage 4 (severe): Secondary | ICD-10-CM | POA: Diagnosis not present

## 2018-06-19 DIAGNOSIS — M1612 Unilateral primary osteoarthritis, left hip: Secondary | ICD-10-CM | POA: Diagnosis not present

## 2018-06-19 DIAGNOSIS — I69354 Hemiplegia and hemiparesis following cerebral infarction affecting left non-dominant side: Secondary | ICD-10-CM | POA: Diagnosis not present

## 2018-06-19 DIAGNOSIS — E119 Type 2 diabetes mellitus without complications: Secondary | ICD-10-CM | POA: Diagnosis not present

## 2018-06-19 DIAGNOSIS — I110 Hypertensive heart disease with heart failure: Secondary | ICD-10-CM | POA: Diagnosis not present

## 2018-06-19 DIAGNOSIS — I2510 Atherosclerotic heart disease of native coronary artery without angina pectoris: Secondary | ICD-10-CM | POA: Diagnosis not present

## 2018-06-22 DIAGNOSIS — I2510 Atherosclerotic heart disease of native coronary artery without angina pectoris: Secondary | ICD-10-CM | POA: Diagnosis not present

## 2018-06-22 DIAGNOSIS — I69354 Hemiplegia and hemiparesis following cerebral infarction affecting left non-dominant side: Secondary | ICD-10-CM | POA: Diagnosis not present

## 2018-06-22 DIAGNOSIS — M1612 Unilateral primary osteoarthritis, left hip: Secondary | ICD-10-CM | POA: Diagnosis not present

## 2018-06-22 DIAGNOSIS — J449 Chronic obstructive pulmonary disease, unspecified: Secondary | ICD-10-CM | POA: Diagnosis not present

## 2018-06-22 DIAGNOSIS — F331 Major depressive disorder, recurrent, moderate: Secondary | ICD-10-CM | POA: Diagnosis not present

## 2018-06-22 DIAGNOSIS — N184 Chronic kidney disease, stage 4 (severe): Secondary | ICD-10-CM | POA: Diagnosis not present

## 2018-06-22 DIAGNOSIS — E119 Type 2 diabetes mellitus without complications: Secondary | ICD-10-CM | POA: Diagnosis not present

## 2018-06-22 DIAGNOSIS — I110 Hypertensive heart disease with heart failure: Secondary | ICD-10-CM | POA: Diagnosis not present

## 2018-06-22 DIAGNOSIS — I5022 Chronic systolic (congestive) heart failure: Secondary | ICD-10-CM | POA: Diagnosis not present

## 2018-06-24 DIAGNOSIS — I110 Hypertensive heart disease with heart failure: Secondary | ICD-10-CM | POA: Diagnosis not present

## 2018-06-24 DIAGNOSIS — F331 Major depressive disorder, recurrent, moderate: Secondary | ICD-10-CM | POA: Diagnosis not present

## 2018-06-24 DIAGNOSIS — N184 Chronic kidney disease, stage 4 (severe): Secondary | ICD-10-CM | POA: Diagnosis not present

## 2018-06-24 DIAGNOSIS — E119 Type 2 diabetes mellitus without complications: Secondary | ICD-10-CM | POA: Diagnosis not present

## 2018-06-24 DIAGNOSIS — J449 Chronic obstructive pulmonary disease, unspecified: Secondary | ICD-10-CM | POA: Diagnosis not present

## 2018-06-24 DIAGNOSIS — M1612 Unilateral primary osteoarthritis, left hip: Secondary | ICD-10-CM | POA: Diagnosis not present

## 2018-06-24 DIAGNOSIS — I69354 Hemiplegia and hemiparesis following cerebral infarction affecting left non-dominant side: Secondary | ICD-10-CM | POA: Diagnosis not present

## 2018-06-24 DIAGNOSIS — I2510 Atherosclerotic heart disease of native coronary artery without angina pectoris: Secondary | ICD-10-CM | POA: Diagnosis not present

## 2018-06-24 DIAGNOSIS — I5022 Chronic systolic (congestive) heart failure: Secondary | ICD-10-CM | POA: Diagnosis not present

## 2018-06-25 DIAGNOSIS — M1612 Unilateral primary osteoarthritis, left hip: Secondary | ICD-10-CM | POA: Diagnosis not present

## 2018-06-25 DIAGNOSIS — E119 Type 2 diabetes mellitus without complications: Secondary | ICD-10-CM | POA: Diagnosis not present

## 2018-06-25 DIAGNOSIS — I5022 Chronic systolic (congestive) heart failure: Secondary | ICD-10-CM | POA: Diagnosis not present

## 2018-06-25 DIAGNOSIS — I110 Hypertensive heart disease with heart failure: Secondary | ICD-10-CM | POA: Diagnosis not present

## 2018-06-25 DIAGNOSIS — J449 Chronic obstructive pulmonary disease, unspecified: Secondary | ICD-10-CM | POA: Diagnosis not present

## 2018-06-25 DIAGNOSIS — I2510 Atherosclerotic heart disease of native coronary artery without angina pectoris: Secondary | ICD-10-CM | POA: Diagnosis not present

## 2018-06-25 DIAGNOSIS — F331 Major depressive disorder, recurrent, moderate: Secondary | ICD-10-CM | POA: Diagnosis not present

## 2018-06-25 DIAGNOSIS — I69354 Hemiplegia and hemiparesis following cerebral infarction affecting left non-dominant side: Secondary | ICD-10-CM | POA: Diagnosis not present

## 2018-06-25 DIAGNOSIS — N184 Chronic kidney disease, stage 4 (severe): Secondary | ICD-10-CM | POA: Diagnosis not present

## 2018-06-26 DIAGNOSIS — I255 Ischemic cardiomyopathy: Secondary | ICD-10-CM | POA: Diagnosis not present

## 2018-06-26 DIAGNOSIS — I110 Hypertensive heart disease with heart failure: Secondary | ICD-10-CM | POA: Diagnosis not present

## 2018-06-26 DIAGNOSIS — E119 Type 2 diabetes mellitus without complications: Secondary | ICD-10-CM | POA: Diagnosis not present

## 2018-06-26 DIAGNOSIS — F331 Major depressive disorder, recurrent, moderate: Secondary | ICD-10-CM | POA: Diagnosis not present

## 2018-06-26 DIAGNOSIS — J449 Chronic obstructive pulmonary disease, unspecified: Secondary | ICD-10-CM | POA: Diagnosis not present

## 2018-06-26 DIAGNOSIS — N184 Chronic kidney disease, stage 4 (severe): Secondary | ICD-10-CM | POA: Diagnosis not present

## 2018-06-26 DIAGNOSIS — I5022 Chronic systolic (congestive) heart failure: Secondary | ICD-10-CM | POA: Diagnosis not present

## 2018-06-26 DIAGNOSIS — I2510 Atherosclerotic heart disease of native coronary artery without angina pectoris: Secondary | ICD-10-CM | POA: Diagnosis not present

## 2018-06-26 DIAGNOSIS — I69354 Hemiplegia and hemiparesis following cerebral infarction affecting left non-dominant side: Secondary | ICD-10-CM | POA: Diagnosis not present

## 2018-06-26 DIAGNOSIS — M1612 Unilateral primary osteoarthritis, left hip: Secondary | ICD-10-CM | POA: Diagnosis not present

## 2018-06-29 DIAGNOSIS — E119 Type 2 diabetes mellitus without complications: Secondary | ICD-10-CM | POA: Diagnosis not present

## 2018-06-29 DIAGNOSIS — I69354 Hemiplegia and hemiparesis following cerebral infarction affecting left non-dominant side: Secondary | ICD-10-CM | POA: Diagnosis not present

## 2018-06-29 DIAGNOSIS — I255 Ischemic cardiomyopathy: Secondary | ICD-10-CM | POA: Diagnosis not present

## 2018-06-29 DIAGNOSIS — I2510 Atherosclerotic heart disease of native coronary artery without angina pectoris: Secondary | ICD-10-CM | POA: Diagnosis not present

## 2018-06-30 DIAGNOSIS — M1612 Unilateral primary osteoarthritis, left hip: Secondary | ICD-10-CM | POA: Diagnosis not present

## 2018-06-30 DIAGNOSIS — I2510 Atherosclerotic heart disease of native coronary artery without angina pectoris: Secondary | ICD-10-CM | POA: Diagnosis not present

## 2018-06-30 DIAGNOSIS — E119 Type 2 diabetes mellitus without complications: Secondary | ICD-10-CM | POA: Diagnosis not present

## 2018-06-30 DIAGNOSIS — N184 Chronic kidney disease, stage 4 (severe): Secondary | ICD-10-CM | POA: Diagnosis not present

## 2018-06-30 DIAGNOSIS — I69354 Hemiplegia and hemiparesis following cerebral infarction affecting left non-dominant side: Secondary | ICD-10-CM | POA: Diagnosis not present

## 2018-06-30 DIAGNOSIS — I5022 Chronic systolic (congestive) heart failure: Secondary | ICD-10-CM | POA: Diagnosis not present

## 2018-06-30 DIAGNOSIS — F331 Major depressive disorder, recurrent, moderate: Secondary | ICD-10-CM | POA: Diagnosis not present

## 2018-06-30 DIAGNOSIS — I110 Hypertensive heart disease with heart failure: Secondary | ICD-10-CM | POA: Diagnosis not present

## 2018-06-30 DIAGNOSIS — J449 Chronic obstructive pulmonary disease, unspecified: Secondary | ICD-10-CM | POA: Diagnosis not present

## 2018-06-30 NOTE — Progress Notes (Signed)
Cardiology Office Note  Date: 07/01/2018   ID: [name], DOB 04-May-1966, MRN [id number]  PCP: Caryl Bis, MD  Primary Cardiologist: Rozann Lesches, MD   Chief Complaint  Patient presents with  . Coronary Artery Disease    History of Present Illness: [name] is a medically complex 52 y.o. male last seen in August.  He is here today for a routine visit.  From a cardiac perspective, he denies any significant angina symptoms.  He is in a wheelchair today, has just completed home PT/OT and is apparently to start back as an outpatient through the facility here in [place].  He still has a fairly dense left-sided hemiparesis.  Current cardiac regimen includes aspirin, Lipitor, Coreg, Plavix, Lasix, Imdur, and Entresto.  Blood pressure has been well controlled on this regimen.  Echocardiogram done back in April revealed improvement in LVEF to approximately 40%, previously 20% around the time of his stroke.  He follows with Dr. Quillian Quince regularly  Past Medical History:  Diagnosis Date  . Anaphylaxis    IGE mediated  . Anxiety   . Arthritis   . Cardiomyopathy ([place])   . CHF (congestive heart failure) ([place])   . COPD (chronic obstructive pulmonary disease) ([place])   . Coronary atherosclerosis of native coronary artery    Multivessel status post CABG 2010  . Depression   . Essential hypertension, benign   . History of CVA (cerebrovascular accident) 01/2012   Right posterior frontal cortical and subcortical brain by MRI, no hemorrhage. Carotid Dopplers showed only 1-50% bilateral ICA stenoses. Echocardiogram showed LVEF 50%, no major valvular abnormalities.  . Mixed hyperlipidemia   . Myocardial infarction ([place]) 2010  . OSA (obstructive sleep apnea)   . Stroke ([place])   . Type 2 diabetes mellitus ([place])     Past Surgical History:  Procedure Laterality Date  . CHOLECYSTECTOMY    . CORONARY ARTERY BYPASS GRAFT  2010   LIMA to LAD, SVG to diagonal, SVG to OM1 and  OM 2, SVG to RCA  . DENTAL SURGERY  2003  . GASTRIC BYPASS  2010  . HERNIA REPAIR  2011, 2012  . INCISIONAL HERNIA REPAIR N/A 07/23/2013   Procedure: HERNIA REPAIR INCISIONAL WITH MESH;  Surgeon: Jamesetta So, MD;  Location: [facility];  Service: General;  Laterality: N/A;  . INSERTION OF MESH N/A 07/23/2013   Procedure: INSERTION OF MESH;  Surgeon: Jamesetta So, MD;  Location: [facility];  Service: General;  Laterality: N/A;  . LEFT HEART CATHETERIZATION WITH CORONARY/GRAFT ANGIOGRAM N/A 11/01/2013   Procedure: LEFT HEART CATHETERIZATION WITH Beatrix Fetters;  Surgeon: Blane Ohara, MD;  Location: [facility] CATH LAB;  Service: Cardiovascular;  Laterality: N/A;  . TOE AMPUTATION  1998   right 1st and 2nd toe  . TONSILECTOMY, ADENOIDECTOMY, BILATERAL MYRINGOTOMY AND TUBES    . TONSILLECTOMY    . VENTRAL HERNIA REPAIR N/A 10/28/2012   Procedure: LAPAROSCOPIC VENTRAL HERNIA;  Surgeon: Donato Heinz, MD;  Location: [facility];  Service: General;  Laterality: N/A;    Current Outpatient Medications  Medication Sig Dispense Refill  . albuterol (PROVENTIL HFA;VENTOLIN HFA) 108 (90 BASE) MCG/ACT inhaler Inhale 2 puffs into the lungs every 6 (six) hours as needed for wheezing or shortness of breath.    . ALPRAZolam (XANAX) 1 MG tablet Take 1 mg by mouth 2 (two) times daily.     . ARIPiprazole (ABILIFY) 10 MG tablet Take 10 mg by mouth daily.    [name]  aspirin EC 81 MG tablet Take 1 tablet (81 mg total) by mouth daily. 90 tablet 3  . atorvastatin (LIPITOR) 40 MG tablet Take 1 tablet by mouth daily.    . baclofen (LIORESAL) 10 MG tablet Take 1 tablet (10 mg total) by mouth 3 (three) times daily as needed for muscle spasms. 90 each 1  . carvedilol (COREG) 6.25 MG tablet Take 6.25 mg by mouth 2 (two) times daily with a meal.    . Cholecalciferol (VITAMIN D) 2000 units tablet Take 2,000 Units by mouth daily at 12 noon.    . clopidogrel (PLAVIX) 75 MG tablet Take 1 tablet by mouth daily.    . DULoxetine (CYMBALTA)  60 MG capsule Take 60 mg by mouth daily.    . fenofibrate micronized (LOFIBRA) 134 MG capsule Take 134 mg by mouth daily before breakfast.     . fluticasone (CUTIVATE) 0.05 % cream Apply 1 application topically 2 (two) times daily.    . furosemide (LASIX) 20 MG tablet Take 20 mg by mouth.    . gabapentin (NEURONTIN) 600 MG tablet Take 1 tablet by mouth 3 (three) times daily.    . hydrOXYzine (ATARAX/VISTARIL) 25 MG tablet Take 25 mg by mouth 2 (two) times daily.    . isosorbide mononitrate (IMDUR) 60 MG 24 hr tablet Take 60 mg by mouth 2 (two) times daily.     . nitroGLYCERIN (NITROSTAT) 0.4 MG SL tablet Place 0.4 mg under the tongue every 5 (five) minutes as needed. For chest pain    . oxyCODONE (OXY IR/ROXICODONE) 5 MG immediate release tablet Take 5 mg by mouth every 8 (eight) hours as needed for severe pain.    . pantoprazole (PROTONIX) 40 MG tablet Take 40 mg by mouth daily.    [name] PARoxetine (PAXIL) 20 MG tablet Take 40 mg by mouth daily.     . phentermine 37.5 MG capsule Take 1 capsule by mouth daily.    . risperiDONE (RISPERDAL) 0.25 MG tablet     . sacubitril-valsartan (ENTRESTO) 49-51 MG TAKE (1) TABLET TWICE DAILY. 60 tablet 6  . vitamin B-12 (CYANOCOBALAMIN) 1000 MCG tablet Take 1,000 mcg by mouth daily at 12 noon.     No current facility-administered medications for this visit.    Allergies:  Contrast media [iodinated diagnostic agents]; Ibuprofen; and Nsaids   Social History: The patient  reports that he has been smoking cigarettes. He started smoking about 36 years ago. He has a 45.00 pack-year smoking history. He has never used smokeless tobacco. He reports that he does not drink alcohol or use drugs.   ROS:  Please see the history of present illness. Otherwise, complete review of systems is positive for fatigue.  All other systems are reviewed and negative.   Physical Exam: VS:  BP 101/60   Pulse 87   Ht 5\' 11"  (1.803 m)   Wt 274 lb 3.2 oz (124.4 kg)   SpO2 100%   BMI  38.24 kg/m , BMI Body mass index is 38.24 kg/m.  Wt Readings from Last 3 Encounters:  07/01/18 274 lb 3.2 oz (124.4 kg)  05/19/18 276 lb (125.2 kg)  04/07/18 279 lb (126.6 kg)    General: Obese male in wheelchair.  Appears comfortable at rest. HEENT: Conjunctiva and lids normal, oropharynx clear. Neck: Supple, no elevated JVP or carotid bruits, no thyromegaly. Lungs: Clear to auscultation, nonlabored breathing at rest. Cardiac: Regular rate and rhythm, no S3 or significant systolic murmur. Abdomen: Soft, nontender, bowel sounds present.  Extremities: Mild lower extremity edema, brace on left leg. Skin: Warm and dry. Musculoskeletal: No kyphosis. Neuropsychiatric: Alert and oriented x3, affect grossly appropriate.  ECG: I personally reviewed the tracing from 07/28/2017 which showed sinus rhythm with vertical axis, anterolateral infarct pattern, nonspecific ST changes.  Recent Labwork: 07/28/2017: B Natriuretic Peptide 138.0; BUN 22; Creatinine, Ser 1.09; Hemoglobin 14.9; Platelets 254; Potassium 3.3; Sodium 139  May 2019: BUN 22, creatinine 1.38, potassium 4.3, AST 20, ALT 18, cholesterol 153, triglycerides 194, HDL 27, LDL 87, hemoglobin A1c 5.7, TSH 2.25  Other Studies Reviewed Today:  Echocardiogram 10/30/2017: Study Conclusions  - Left ventricle: The cavity size was mildly dilated. Wall thickness was increased in a pattern of mild LVH. The estimated ejection fraction was approximately 40% based on limited images. Indeterminate diastolic function. There is akinesis of the anteroseptal myocardium. - Aortic valve: Mildly calcified annulus. Trileaflet. There was mild regurgitation. - Mitral valve: Mildly calcified annulus. There was trivial regurgitation. - Right ventricle: Poorly visualized. - Atrial septum: No defect or patent foramen ovale was identified. - Tricuspid valve: There was trivial regurgitation. - Pulmonary arteries: Systolic pressure could not be  accurately estimated. - Pericardium, extracardiac: There was no pericardial effusion.  Lexiscan Myoview 06/18/2017([facility]): FINDINGS: Large fixed defect is present involving the apex and large portion of the periapical anterior wall. No evidence of ischemia. Wall motion analysis reveals apical dyskinesis and akinesis. Left ventricular ejection fraction is calculated to be  18%.  IMPRESSION: Large infarct involving the apex and periapical anterior wall. No evidence of ischemia. Apical akinesis and dyskinesis with ejection fraction of 18%.  Assessment and Plan:  1.  Multivessel CAD status post CABG with patent bypass grafts in 2015, follow-up Myoview in December of last year showing large infarct scar but no ischemia at which point LVEF was 18%.  Since that time he is undergone medication adjustments for treatment of ischemic cardiomyopathy with LVEF up to 40%.  He has been clinically stable, no fluid overload today.  Continue with present medications.  2.  History of stroke with left-sided hemiparesis.  He continues with rehabilitation as an outpatient.  3.  Mixed hyperlipidemia on Lipitor.  He is following with Dr. Quillian Quince.  Last LDL 87.  4.  Essential hypertension, blood pressure is well controlled today.  Current medicines were reviewed with the patient today.   Orders Placed This Encounter  Procedures  . EKG 12-Lead    Disposition: Follow-up in 6 months.  Signed, Satira Sark, MD, [facility] 07/01/2018 1:29 PM    [facility] [place], [place], [place] [address] Phone: [phone]; Fax: [phone]

## 2018-07-01 ENCOUNTER — Encounter: Payer: Self-pay | Admitting: Cardiology

## 2018-07-01 ENCOUNTER — Ambulatory Visit (INDEPENDENT_AMBULATORY_CARE_PROVIDER_SITE_OTHER): Payer: Medicare HMO | Admitting: Cardiology

## 2018-07-01 VITALS — BP 101/60 | HR 87 | Ht 71.0 in | Wt 274.2 lb

## 2018-07-01 DIAGNOSIS — E119 Type 2 diabetes mellitus without complications: Secondary | ICD-10-CM | POA: Diagnosis not present

## 2018-07-01 DIAGNOSIS — I2510 Atherosclerotic heart disease of native coronary artery without angina pectoris: Secondary | ICD-10-CM | POA: Diagnosis not present

## 2018-07-01 DIAGNOSIS — I25119 Atherosclerotic heart disease of native coronary artery with unspecified angina pectoris: Secondary | ICD-10-CM

## 2018-07-01 DIAGNOSIS — I255 Ischemic cardiomyopathy: Secondary | ICD-10-CM | POA: Diagnosis not present

## 2018-07-01 DIAGNOSIS — I10 Essential (primary) hypertension: Secondary | ICD-10-CM

## 2018-07-01 DIAGNOSIS — I69354 Hemiplegia and hemiparesis following cerebral infarction affecting left non-dominant side: Secondary | ICD-10-CM | POA: Diagnosis not present

## 2018-07-01 DIAGNOSIS — E782 Mixed hyperlipidemia: Secondary | ICD-10-CM | POA: Diagnosis not present

## 2018-07-01 NOTE — Patient Instructions (Addendum)

## 2018-07-02 DIAGNOSIS — I69354 Hemiplegia and hemiparesis following cerebral infarction affecting left non-dominant side: Secondary | ICD-10-CM | POA: Diagnosis not present

## 2018-07-02 DIAGNOSIS — M1612 Unilateral primary osteoarthritis, left hip: Secondary | ICD-10-CM | POA: Diagnosis not present

## 2018-07-02 DIAGNOSIS — N184 Chronic kidney disease, stage 4 (severe): Secondary | ICD-10-CM | POA: Diagnosis not present

## 2018-07-02 DIAGNOSIS — I110 Hypertensive heart disease with heart failure: Secondary | ICD-10-CM | POA: Diagnosis not present

## 2018-07-02 DIAGNOSIS — I255 Ischemic cardiomyopathy: Secondary | ICD-10-CM | POA: Diagnosis not present

## 2018-07-02 DIAGNOSIS — E119 Type 2 diabetes mellitus without complications: Secondary | ICD-10-CM | POA: Diagnosis not present

## 2018-07-02 DIAGNOSIS — J449 Chronic obstructive pulmonary disease, unspecified: Secondary | ICD-10-CM | POA: Diagnosis not present

## 2018-07-02 DIAGNOSIS — I5022 Chronic systolic (congestive) heart failure: Secondary | ICD-10-CM | POA: Diagnosis not present

## 2018-07-02 DIAGNOSIS — I2510 Atherosclerotic heart disease of native coronary artery without angina pectoris: Secondary | ICD-10-CM | POA: Diagnosis not present

## 2018-07-02 DIAGNOSIS — F331 Major depressive disorder, recurrent, moderate: Secondary | ICD-10-CM | POA: Diagnosis not present

## 2018-07-03 ENCOUNTER — Encounter: Payer: Self-pay | Admitting: Physical Medicine & Rehabilitation

## 2018-07-03 ENCOUNTER — Encounter: Payer: Medicare HMO | Attending: Physical Medicine & Rehabilitation

## 2018-07-03 ENCOUNTER — Ambulatory Visit (HOSPITAL_BASED_OUTPATIENT_CLINIC_OR_DEPARTMENT_OTHER): Payer: Medicare HMO | Admitting: Physical Medicine & Rehabilitation

## 2018-07-03 VITALS — BP 109/70 | HR 80 | Wt 274.0 lb

## 2018-07-03 DIAGNOSIS — I2510 Atherosclerotic heart disease of native coronary artery without angina pectoris: Secondary | ICD-10-CM | POA: Insufficient documentation

## 2018-07-03 DIAGNOSIS — F419 Anxiety disorder, unspecified: Secondary | ICD-10-CM | POA: Insufficient documentation

## 2018-07-03 DIAGNOSIS — I509 Heart failure, unspecified: Secondary | ICD-10-CM | POA: Diagnosis not present

## 2018-07-03 DIAGNOSIS — G8110 Spastic hemiplegia affecting unspecified side: Secondary | ICD-10-CM

## 2018-07-03 DIAGNOSIS — G4733 Obstructive sleep apnea (adult) (pediatric): Secondary | ICD-10-CM | POA: Diagnosis not present

## 2018-07-03 DIAGNOSIS — E782 Mixed hyperlipidemia: Secondary | ICD-10-CM | POA: Diagnosis not present

## 2018-07-03 DIAGNOSIS — J449 Chronic obstructive pulmonary disease, unspecified: Secondary | ICD-10-CM | POA: Insufficient documentation

## 2018-07-03 DIAGNOSIS — E119 Type 2 diabetes mellitus without complications: Secondary | ICD-10-CM | POA: Insufficient documentation

## 2018-07-03 DIAGNOSIS — I252 Old myocardial infarction: Secondary | ICD-10-CM | POA: Insufficient documentation

## 2018-07-03 DIAGNOSIS — F17210 Nicotine dependence, cigarettes, uncomplicated: Secondary | ICD-10-CM | POA: Diagnosis not present

## 2018-07-03 DIAGNOSIS — M1990 Unspecified osteoarthritis, unspecified site: Secondary | ICD-10-CM | POA: Insufficient documentation

## 2018-07-03 DIAGNOSIS — I110 Hypertensive heart disease with heart failure: Secondary | ICD-10-CM | POA: Insufficient documentation

## 2018-07-03 DIAGNOSIS — I69354 Hemiplegia and hemiparesis following cerebral infarction affecting left non-dominant side: Secondary | ICD-10-CM | POA: Insufficient documentation

## 2018-07-03 DIAGNOSIS — I255 Ischemic cardiomyopathy: Secondary | ICD-10-CM | POA: Diagnosis not present

## 2018-07-03 NOTE — Progress Notes (Signed)
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 This includes bleeding, bruising, infection, excessive weakness, or medication side effects. A REMS form is on file and signed. Needle:  needle electrode Number of units per muscle LUE FDS 100 FDP 100 FPL 100 Biceps 100 Brachiorad 100  LLE Medial Hamstrings 300  Med gastroc 100 Lat Gastro 100 Med soleus 100 Lat soleus 100 Tibialis post 300 All injections were done after obtaining appropriate EMG activity and after negative drawback for blood. The patient tolerated the procedure well. Post procedure instructions were given. A followup appointment was made.

## 2018-07-04 DIAGNOSIS — I2510 Atherosclerotic heart disease of native coronary artery without angina pectoris: Secondary | ICD-10-CM | POA: Diagnosis not present

## 2018-07-04 DIAGNOSIS — I69398 Other sequelae of cerebral infarction: Secondary | ICD-10-CM | POA: Diagnosis not present

## 2018-07-06 DIAGNOSIS — I69354 Hemiplegia and hemiparesis following cerebral infarction affecting left non-dominant side: Secondary | ICD-10-CM | POA: Diagnosis not present

## 2018-07-06 DIAGNOSIS — I255 Ischemic cardiomyopathy: Secondary | ICD-10-CM | POA: Diagnosis not present

## 2018-07-06 DIAGNOSIS — E119 Type 2 diabetes mellitus without complications: Secondary | ICD-10-CM | POA: Diagnosis not present

## 2018-07-06 DIAGNOSIS — I2510 Atherosclerotic heart disease of native coronary artery without angina pectoris: Secondary | ICD-10-CM | POA: Diagnosis not present

## 2018-07-07 DIAGNOSIS — I69354 Hemiplegia and hemiparesis following cerebral infarction affecting left non-dominant side: Secondary | ICD-10-CM | POA: Diagnosis not present

## 2018-07-07 DIAGNOSIS — I255 Ischemic cardiomyopathy: Secondary | ICD-10-CM | POA: Diagnosis not present

## 2018-07-07 DIAGNOSIS — I2510 Atherosclerotic heart disease of native coronary artery without angina pectoris: Secondary | ICD-10-CM | POA: Diagnosis not present

## 2018-07-07 DIAGNOSIS — E119 Type 2 diabetes mellitus without complications: Secondary | ICD-10-CM | POA: Diagnosis not present

## 2018-07-10 DIAGNOSIS — J449 Chronic obstructive pulmonary disease, unspecified: Secondary | ICD-10-CM | POA: Diagnosis not present

## 2018-07-10 DIAGNOSIS — I2510 Atherosclerotic heart disease of native coronary artery without angina pectoris: Secondary | ICD-10-CM | POA: Diagnosis not present

## 2018-07-10 DIAGNOSIS — I69398 Other sequelae of cerebral infarction: Secondary | ICD-10-CM | POA: Diagnosis not present

## 2018-07-13 DIAGNOSIS — I255 Ischemic cardiomyopathy: Secondary | ICD-10-CM | POA: Diagnosis not present

## 2018-07-13 DIAGNOSIS — E119 Type 2 diabetes mellitus without complications: Secondary | ICD-10-CM | POA: Diagnosis not present

## 2018-07-13 DIAGNOSIS — I69354 Hemiplegia and hemiparesis following cerebral infarction affecting left non-dominant side: Secondary | ICD-10-CM | POA: Diagnosis not present

## 2018-07-13 DIAGNOSIS — I2510 Atherosclerotic heart disease of native coronary artery without angina pectoris: Secondary | ICD-10-CM | POA: Diagnosis not present

## 2018-07-14 DIAGNOSIS — E119 Type 2 diabetes mellitus without complications: Secondary | ICD-10-CM | POA: Diagnosis not present

## 2018-07-14 DIAGNOSIS — I2510 Atherosclerotic heart disease of native coronary artery without angina pectoris: Secondary | ICD-10-CM | POA: Diagnosis not present

## 2018-07-14 DIAGNOSIS — I255 Ischemic cardiomyopathy: Secondary | ICD-10-CM | POA: Diagnosis not present

## 2018-07-14 DIAGNOSIS — I69354 Hemiplegia and hemiparesis following cerebral infarction affecting left non-dominant side: Secondary | ICD-10-CM | POA: Diagnosis not present

## 2018-07-16 DIAGNOSIS — E119 Type 2 diabetes mellitus without complications: Secondary | ICD-10-CM | POA: Diagnosis not present

## 2018-07-16 DIAGNOSIS — I2510 Atherosclerotic heart disease of native coronary artery without angina pectoris: Secondary | ICD-10-CM | POA: Diagnosis not present

## 2018-07-16 DIAGNOSIS — I69354 Hemiplegia and hemiparesis following cerebral infarction affecting left non-dominant side: Secondary | ICD-10-CM | POA: Diagnosis not present

## 2018-07-16 DIAGNOSIS — I255 Ischemic cardiomyopathy: Secondary | ICD-10-CM | POA: Diagnosis not present

## 2018-07-17 DIAGNOSIS — Z79899 Other long term (current) drug therapy: Secondary | ICD-10-CM | POA: Diagnosis not present

## 2018-07-17 DIAGNOSIS — I2510 Atherosclerotic heart disease of native coronary artery without angina pectoris: Secondary | ICD-10-CM | POA: Diagnosis not present

## 2018-07-17 DIAGNOSIS — I69354 Hemiplegia and hemiparesis following cerebral infarction affecting left non-dominant side: Secondary | ICD-10-CM | POA: Diagnosis not present

## 2018-07-17 DIAGNOSIS — I255 Ischemic cardiomyopathy: Secondary | ICD-10-CM | POA: Diagnosis not present

## 2018-07-17 DIAGNOSIS — I639 Cerebral infarction, unspecified: Secondary | ICD-10-CM | POA: Diagnosis not present

## 2018-07-17 DIAGNOSIS — E119 Type 2 diabetes mellitus without complications: Secondary | ICD-10-CM | POA: Diagnosis not present

## 2018-07-17 DIAGNOSIS — G894 Chronic pain syndrome: Secondary | ICD-10-CM | POA: Diagnosis not present

## 2018-07-20 DIAGNOSIS — Z951 Presence of aortocoronary bypass graft: Secondary | ICD-10-CM | POA: Diagnosis not present

## 2018-07-20 DIAGNOSIS — I69952 Hemiplegia and hemiparesis following unspecified cerebrovascular disease affecting left dominant side: Secondary | ICD-10-CM | POA: Diagnosis not present

## 2018-07-20 DIAGNOSIS — R200 Anesthesia of skin: Secondary | ICD-10-CM | POA: Diagnosis not present

## 2018-07-20 DIAGNOSIS — I639 Cerebral infarction, unspecified: Secondary | ICD-10-CM | POA: Diagnosis not present

## 2018-07-20 DIAGNOSIS — R262 Difficulty in walking, not elsewhere classified: Secondary | ICD-10-CM | POA: Diagnosis not present

## 2018-07-20 DIAGNOSIS — M6281 Muscle weakness (generalized): Secondary | ICD-10-CM | POA: Diagnosis not present

## 2018-07-20 DIAGNOSIS — F17210 Nicotine dependence, cigarettes, uncomplicated: Secondary | ICD-10-CM | POA: Diagnosis not present

## 2018-07-20 DIAGNOSIS — R278 Other lack of coordination: Secondary | ICD-10-CM | POA: Diagnosis not present

## 2018-07-20 DIAGNOSIS — Z7901 Long term (current) use of anticoagulants: Secondary | ICD-10-CM | POA: Diagnosis not present

## 2018-07-20 DIAGNOSIS — I2510 Atherosclerotic heart disease of native coronary artery without angina pectoris: Secondary | ICD-10-CM | POA: Diagnosis not present

## 2018-07-20 DIAGNOSIS — I10 Essential (primary) hypertension: Secondary | ICD-10-CM | POA: Diagnosis not present

## 2018-07-20 DIAGNOSIS — R269 Unspecified abnormalities of gait and mobility: Secondary | ICD-10-CM | POA: Diagnosis not present

## 2018-07-20 DIAGNOSIS — E119 Type 2 diabetes mellitus without complications: Secondary | ICD-10-CM | POA: Diagnosis not present

## 2018-07-20 DIAGNOSIS — R29818 Other symptoms and signs involving the nervous system: Secondary | ICD-10-CM | POA: Diagnosis not present

## 2018-07-20 DIAGNOSIS — Z7982 Long term (current) use of aspirin: Secondary | ICD-10-CM | POA: Diagnosis not present

## 2018-07-20 DIAGNOSIS — F329 Major depressive disorder, single episode, unspecified: Secondary | ICD-10-CM | POA: Diagnosis not present

## 2018-07-21 DIAGNOSIS — I255 Ischemic cardiomyopathy: Secondary | ICD-10-CM | POA: Diagnosis not present

## 2018-07-21 DIAGNOSIS — I2510 Atherosclerotic heart disease of native coronary artery without angina pectoris: Secondary | ICD-10-CM | POA: Diagnosis not present

## 2018-07-21 DIAGNOSIS — E119 Type 2 diabetes mellitus without complications: Secondary | ICD-10-CM | POA: Diagnosis not present

## 2018-07-21 DIAGNOSIS — I69354 Hemiplegia and hemiparesis following cerebral infarction affecting left non-dominant side: Secondary | ICD-10-CM | POA: Diagnosis not present

## 2018-07-22 DIAGNOSIS — J449 Chronic obstructive pulmonary disease, unspecified: Secondary | ICD-10-CM | POA: Diagnosis not present

## 2018-07-22 DIAGNOSIS — K210 Gastro-esophageal reflux disease with esophagitis: Secondary | ICD-10-CM | POA: Diagnosis not present

## 2018-07-22 DIAGNOSIS — E119 Type 2 diabetes mellitus without complications: Secondary | ICD-10-CM | POA: Diagnosis not present

## 2018-07-22 DIAGNOSIS — I69354 Hemiplegia and hemiparesis following cerebral infarction affecting left non-dominant side: Secondary | ICD-10-CM | POA: Diagnosis not present

## 2018-07-22 DIAGNOSIS — I255 Ischemic cardiomyopathy: Secondary | ICD-10-CM | POA: Diagnosis not present

## 2018-07-22 DIAGNOSIS — G4733 Obstructive sleep apnea (adult) (pediatric): Secondary | ICD-10-CM | POA: Diagnosis not present

## 2018-07-22 DIAGNOSIS — I2510 Atherosclerotic heart disease of native coronary artery without angina pectoris: Secondary | ICD-10-CM | POA: Diagnosis not present

## 2018-07-22 DIAGNOSIS — E8881 Metabolic syndrome: Secondary | ICD-10-CM | POA: Diagnosis not present

## 2018-07-22 DIAGNOSIS — E782 Mixed hyperlipidemia: Secondary | ICD-10-CM | POA: Diagnosis not present

## 2018-07-22 DIAGNOSIS — Z9189 Other specified personal risk factors, not elsewhere classified: Secondary | ICD-10-CM | POA: Diagnosis not present

## 2018-07-22 DIAGNOSIS — I10 Essential (primary) hypertension: Secondary | ICD-10-CM | POA: Diagnosis not present

## 2018-07-22 DIAGNOSIS — E1165 Type 2 diabetes mellitus with hyperglycemia: Secondary | ICD-10-CM | POA: Diagnosis not present

## 2018-07-23 DIAGNOSIS — I2510 Atherosclerotic heart disease of native coronary artery without angina pectoris: Secondary | ICD-10-CM | POA: Diagnosis not present

## 2018-07-23 DIAGNOSIS — E119 Type 2 diabetes mellitus without complications: Secondary | ICD-10-CM | POA: Diagnosis not present

## 2018-07-23 DIAGNOSIS — I69354 Hemiplegia and hemiparesis following cerebral infarction affecting left non-dominant side: Secondary | ICD-10-CM | POA: Diagnosis not present

## 2018-07-23 DIAGNOSIS — I255 Ischemic cardiomyopathy: Secondary | ICD-10-CM | POA: Diagnosis not present

## 2018-07-24 DIAGNOSIS — I69354 Hemiplegia and hemiparesis following cerebral infarction affecting left non-dominant side: Secondary | ICD-10-CM | POA: Diagnosis not present

## 2018-07-24 DIAGNOSIS — E119 Type 2 diabetes mellitus without complications: Secondary | ICD-10-CM | POA: Diagnosis not present

## 2018-07-24 DIAGNOSIS — I255 Ischemic cardiomyopathy: Secondary | ICD-10-CM | POA: Diagnosis not present

## 2018-07-24 DIAGNOSIS — I2510 Atherosclerotic heart disease of native coronary artery without angina pectoris: Secondary | ICD-10-CM | POA: Diagnosis not present

## 2018-07-27 DIAGNOSIS — I255 Ischemic cardiomyopathy: Secondary | ICD-10-CM | POA: Diagnosis not present

## 2018-07-27 DIAGNOSIS — E119 Type 2 diabetes mellitus without complications: Secondary | ICD-10-CM | POA: Diagnosis not present

## 2018-07-27 DIAGNOSIS — I69354 Hemiplegia and hemiparesis following cerebral infarction affecting left non-dominant side: Secondary | ICD-10-CM | POA: Diagnosis not present

## 2018-07-27 DIAGNOSIS — I2510 Atherosclerotic heart disease of native coronary artery without angina pectoris: Secondary | ICD-10-CM | POA: Diagnosis not present

## 2018-07-28 DIAGNOSIS — S4992XA Unspecified injury of left shoulder and upper arm, initial encounter: Secondary | ICD-10-CM | POA: Diagnosis not present

## 2018-07-28 DIAGNOSIS — I10 Essential (primary) hypertension: Secondary | ICD-10-CM | POA: Diagnosis not present

## 2018-07-28 DIAGNOSIS — J449 Chronic obstructive pulmonary disease, unspecified: Secondary | ICD-10-CM | POA: Diagnosis not present

## 2018-07-28 DIAGNOSIS — I255 Ischemic cardiomyopathy: Secondary | ICD-10-CM | POA: Diagnosis not present

## 2018-07-28 DIAGNOSIS — G252 Other specified forms of tremor: Secondary | ICD-10-CM | POA: Diagnosis not present

## 2018-07-28 DIAGNOSIS — M25512 Pain in left shoulder: Secondary | ICD-10-CM | POA: Diagnosis not present

## 2018-07-28 DIAGNOSIS — Z1331 Encounter for screening for depression: Secondary | ICD-10-CM | POA: Diagnosis not present

## 2018-07-28 DIAGNOSIS — I2510 Atherosclerotic heart disease of native coronary artery without angina pectoris: Secondary | ICD-10-CM | POA: Diagnosis not present

## 2018-07-28 DIAGNOSIS — F331 Major depressive disorder, recurrent, moderate: Secondary | ICD-10-CM | POA: Diagnosis not present

## 2018-07-28 DIAGNOSIS — E119 Type 2 diabetes mellitus without complications: Secondary | ICD-10-CM | POA: Diagnosis not present

## 2018-07-28 DIAGNOSIS — I69354 Hemiplegia and hemiparesis following cerebral infarction affecting left non-dominant side: Secondary | ICD-10-CM | POA: Diagnosis not present

## 2018-07-28 DIAGNOSIS — Z1389 Encounter for screening for other disorder: Secondary | ICD-10-CM | POA: Diagnosis not present

## 2018-07-29 DIAGNOSIS — I69354 Hemiplegia and hemiparesis following cerebral infarction affecting left non-dominant side: Secondary | ICD-10-CM | POA: Diagnosis not present

## 2018-07-29 DIAGNOSIS — I2510 Atherosclerotic heart disease of native coronary artery without angina pectoris: Secondary | ICD-10-CM | POA: Diagnosis not present

## 2018-07-29 DIAGNOSIS — E119 Type 2 diabetes mellitus without complications: Secondary | ICD-10-CM | POA: Diagnosis not present

## 2018-07-29 DIAGNOSIS — I255 Ischemic cardiomyopathy: Secondary | ICD-10-CM | POA: Diagnosis not present

## 2018-07-30 DIAGNOSIS — Z87891 Personal history of nicotine dependence: Secondary | ICD-10-CM | POA: Diagnosis not present

## 2018-07-30 DIAGNOSIS — E7800 Pure hypercholesterolemia, unspecified: Secondary | ICD-10-CM | POA: Diagnosis not present

## 2018-07-30 DIAGNOSIS — J449 Chronic obstructive pulmonary disease, unspecified: Secondary | ICD-10-CM | POA: Diagnosis not present

## 2018-07-30 DIAGNOSIS — F39 Unspecified mood [affective] disorder: Secondary | ICD-10-CM | POA: Diagnosis not present

## 2018-07-30 DIAGNOSIS — I2510 Atherosclerotic heart disease of native coronary artery without angina pectoris: Secondary | ICD-10-CM | POA: Diagnosis not present

## 2018-07-30 DIAGNOSIS — R5383 Other fatigue: Secondary | ICD-10-CM | POA: Diagnosis not present

## 2018-07-30 DIAGNOSIS — I509 Heart failure, unspecified: Secondary | ICD-10-CM | POA: Diagnosis not present

## 2018-07-30 DIAGNOSIS — F17200 Nicotine dependence, unspecified, uncomplicated: Secondary | ICD-10-CM | POA: Diagnosis not present

## 2018-07-30 DIAGNOSIS — Z131 Encounter for screening for diabetes mellitus: Secondary | ICD-10-CM | POA: Diagnosis not present

## 2018-07-30 DIAGNOSIS — Z89421 Acquired absence of other right toe(s): Secondary | ICD-10-CM | POA: Diagnosis not present

## 2018-07-30 DIAGNOSIS — I255 Ischemic cardiomyopathy: Secondary | ICD-10-CM | POA: Diagnosis not present

## 2018-07-30 DIAGNOSIS — I69354 Hemiplegia and hemiparesis following cerebral infarction affecting left non-dominant side: Secondary | ICD-10-CM | POA: Diagnosis not present

## 2018-07-30 DIAGNOSIS — F329 Major depressive disorder, single episode, unspecified: Secondary | ICD-10-CM | POA: Diagnosis not present

## 2018-07-30 DIAGNOSIS — E119 Type 2 diabetes mellitus without complications: Secondary | ICD-10-CM | POA: Diagnosis not present

## 2018-07-30 DIAGNOSIS — I110 Hypertensive heart disease with heart failure: Secondary | ICD-10-CM | POA: Diagnosis not present

## 2018-07-30 DIAGNOSIS — Z79899 Other long term (current) drug therapy: Secondary | ICD-10-CM | POA: Diagnosis not present

## 2018-07-31 DIAGNOSIS — I255 Ischemic cardiomyopathy: Secondary | ICD-10-CM | POA: Diagnosis not present

## 2018-07-31 DIAGNOSIS — I2510 Atherosclerotic heart disease of native coronary artery without angina pectoris: Secondary | ICD-10-CM | POA: Diagnosis not present

## 2018-07-31 DIAGNOSIS — I69354 Hemiplegia and hemiparesis following cerebral infarction affecting left non-dominant side: Secondary | ICD-10-CM | POA: Diagnosis not present

## 2018-07-31 DIAGNOSIS — E119 Type 2 diabetes mellitus without complications: Secondary | ICD-10-CM | POA: Diagnosis not present

## 2018-08-03 DIAGNOSIS — I2510 Atherosclerotic heart disease of native coronary artery without angina pectoris: Secondary | ICD-10-CM | POA: Diagnosis not present

## 2018-08-03 DIAGNOSIS — I255 Ischemic cardiomyopathy: Secondary | ICD-10-CM | POA: Diagnosis not present

## 2018-08-03 DIAGNOSIS — I69354 Hemiplegia and hemiparesis following cerebral infarction affecting left non-dominant side: Secondary | ICD-10-CM | POA: Diagnosis not present

## 2018-08-03 DIAGNOSIS — E119 Type 2 diabetes mellitus without complications: Secondary | ICD-10-CM | POA: Diagnosis not present

## 2018-08-04 DIAGNOSIS — I255 Ischemic cardiomyopathy: Secondary | ICD-10-CM | POA: Diagnosis not present

## 2018-08-04 DIAGNOSIS — R278 Other lack of coordination: Secondary | ICD-10-CM | POA: Diagnosis not present

## 2018-08-04 DIAGNOSIS — M6281 Muscle weakness (generalized): Secondary | ICD-10-CM | POA: Diagnosis not present

## 2018-08-04 DIAGNOSIS — R262 Difficulty in walking, not elsewhere classified: Secondary | ICD-10-CM | POA: Diagnosis not present

## 2018-08-04 DIAGNOSIS — I69952 Hemiplegia and hemiparesis following unspecified cerebrovascular disease affecting left dominant side: Secondary | ICD-10-CM | POA: Diagnosis not present

## 2018-08-04 DIAGNOSIS — E119 Type 2 diabetes mellitus without complications: Secondary | ICD-10-CM | POA: Diagnosis not present

## 2018-08-04 DIAGNOSIS — I69398 Other sequelae of cerebral infarction: Secondary | ICD-10-CM | POA: Diagnosis not present

## 2018-08-04 DIAGNOSIS — R269 Unspecified abnormalities of gait and mobility: Secondary | ICD-10-CM | POA: Diagnosis not present

## 2018-08-04 DIAGNOSIS — I2510 Atherosclerotic heart disease of native coronary artery without angina pectoris: Secondary | ICD-10-CM | POA: Diagnosis not present

## 2018-08-04 DIAGNOSIS — I69354 Hemiplegia and hemiparesis following cerebral infarction affecting left non-dominant side: Secondary | ICD-10-CM | POA: Diagnosis not present

## 2018-08-05 DIAGNOSIS — E119 Type 2 diabetes mellitus without complications: Secondary | ICD-10-CM | POA: Diagnosis not present

## 2018-08-05 DIAGNOSIS — I255 Ischemic cardiomyopathy: Secondary | ICD-10-CM | POA: Diagnosis not present

## 2018-08-05 DIAGNOSIS — I69354 Hemiplegia and hemiparesis following cerebral infarction affecting left non-dominant side: Secondary | ICD-10-CM | POA: Diagnosis not present

## 2018-08-05 DIAGNOSIS — I2510 Atherosclerotic heart disease of native coronary artery without angina pectoris: Secondary | ICD-10-CM | POA: Diagnosis not present

## 2018-08-06 DIAGNOSIS — I69354 Hemiplegia and hemiparesis following cerebral infarction affecting left non-dominant side: Secondary | ICD-10-CM | POA: Diagnosis not present

## 2018-08-06 DIAGNOSIS — E119 Type 2 diabetes mellitus without complications: Secondary | ICD-10-CM | POA: Diagnosis not present

## 2018-08-06 DIAGNOSIS — I255 Ischemic cardiomyopathy: Secondary | ICD-10-CM | POA: Diagnosis not present

## 2018-08-06 DIAGNOSIS — I2510 Atherosclerotic heart disease of native coronary artery without angina pectoris: Secondary | ICD-10-CM | POA: Diagnosis not present

## 2018-08-10 DIAGNOSIS — I2510 Atherosclerotic heart disease of native coronary artery without angina pectoris: Secondary | ICD-10-CM | POA: Diagnosis not present

## 2018-08-10 DIAGNOSIS — I69398 Other sequelae of cerebral infarction: Secondary | ICD-10-CM | POA: Diagnosis not present

## 2018-08-10 DIAGNOSIS — J449 Chronic obstructive pulmonary disease, unspecified: Secondary | ICD-10-CM | POA: Diagnosis not present

## 2018-08-11 DIAGNOSIS — M6281 Muscle weakness (generalized): Secondary | ICD-10-CM | POA: Diagnosis not present

## 2018-08-11 DIAGNOSIS — R269 Unspecified abnormalities of gait and mobility: Secondary | ICD-10-CM | POA: Diagnosis not present

## 2018-08-11 DIAGNOSIS — I69952 Hemiplegia and hemiparesis following unspecified cerebrovascular disease affecting left dominant side: Secondary | ICD-10-CM | POA: Diagnosis not present

## 2018-08-11 DIAGNOSIS — R278 Other lack of coordination: Secondary | ICD-10-CM | POA: Diagnosis not present

## 2018-08-11 DIAGNOSIS — R262 Difficulty in walking, not elsewhere classified: Secondary | ICD-10-CM | POA: Diagnosis not present

## 2018-08-11 NOTE — Progress Notes (Signed)
NEUROLOGY FOLLOW UP OFFICE NOTE  [name] [id number]  HISTORY OF PRESENT ILLNESS: [name] is a 53 year old right-handed man with hypertension, type 2 diabetes mellitus, CAD, cardiomyopathy, COPD, tobacco use disorder, depression and history of prior strokes who follows up for stroke and memory deficits.  He is accompanied by his wife who supplements history.  UPDATE: Current medications:  ASA 81mg , Plavix 75mg , atorvastatin 80mg , Abilify, Cymbalta, gabapentin, Paxil, Risperdal  He had neuropsychological testing performed on 03/31/18, which demonstrated vascular cognitive impairment affecting psychomotor processing speed, attention/working memory, and executive functioning.  He is involved in PT and speech therapy.    He sees Dr. Letta Pate for Dysport injection to treat spasticity.  He reports panic attacks.  He sees a Teacher, music.    About a month ago, he had some left sided facial numbness.  His PCP ordered imaging and was told he didn't have a new stroke.    HISTORY: He was admitted to [facility] on 06/14/17 with sudden onset left sided weakness and numbness and difficulty speaking.  He initially presented to [facility] where he received tPA with NIHSS of 15 and was then transferred to [facility].    CT of head from [facility] reportedly showed "acute infarction involving portions of the posterior right basal ganglia, right corona radiata and body of the right caudate nucleus".  MRI of brain at [facility] confirmed acute right basal ganglia infarct as well as remote right frontal infarct.  MRA of head and carotid doppler revealed no significant intracranial or extracranial arterial stenosis or occlusion.  He was unable to have CTA due to allergy to iodinated contrast.    Echocardiogram with bubble study showed EF of 25-30% with no evidence of PFO or ASD.  He was evaluated by cardiology for ischemic cardiomyopathy.  Lexiscan nuclear perfusion study  revealed EF 18% with large infarct involving the apex and periapical anterior wall with no evidence of ischemia.  He was advised to follow up with outpatient cardiology.  LDL was 123 and triglycerides were 237.  Hgb A1c was 5.7.  He was already taking ASA 325mg  but not daily, as well as Plavix.  He is continued on ASA and Plavix.  He was continued on rosuvastatin 40mg , Zetia 10mg  and Tricor 145mg  daily.  He was seen in the ED at [facility] on 09/22/17 for headache.  CT of head showed no acute findings but was read as demonstrating a chronic appearing infarct in the right posterior limb of internal capsule that was not present on prior CT from 06/14/17.  He also complained of left leg pain.  He reportedly had an elevated d dimer.  CT chest and venous doppler of lower extremity were negative for DVT.  However, the started him on Xarelto.  He was told to stop Plavix by his PCP.  I contacted his PCP, Dr. Quillian Quince, who reviewed the ED notes and is also unsure why they started Xarelto when imaging was negative for DVT.  He was advised to discontinue Xarelto and restart Plavix with aspirin.  PAST MEDICAL HISTORY: Past Medical History:  Diagnosis Date  . Anaphylaxis    IGE mediated  . Anxiety   . Arthritis   . Cardiomyopathy ([place])   . CHF (congestive heart failure) ([place])   . COPD (chronic obstructive pulmonary disease) ([place])   . Coronary atherosclerosis of native coronary artery    Multivessel status post CABG 2010  . Depression   . Essential hypertension, benign   .  History of CVA (cerebrovascular accident) 01/2012   Right posterior frontal cortical and subcortical brain by MRI, no hemorrhage. Carotid Dopplers showed only 1-50% bilateral ICA stenoses. Echocardiogram showed LVEF 50%, no major valvular abnormalities.  . Mixed hyperlipidemia   . Myocardial infarction ([place]) 2010  . OSA (obstructive sleep apnea)   . Stroke ([place])   . Type 2 diabetes mellitus ([facility])      MEDICATIONS: Current Outpatient Medications on File Prior to Visit  Medication Sig Dispense Refill  . albuterol (PROVENTIL HFA;VENTOLIN HFA) 108 (90 BASE) MCG/ACT inhaler Inhale 2 puffs into the lungs every 6 (six) hours as needed for wheezing or shortness of breath.    . ALPRAZolam (XANAX) 1 MG tablet Take 1 mg by mouth 2 (two) times daily.     . ARIPiprazole (ABILIFY) 10 MG tablet Take 10 mg by mouth daily.    [name] aspirin EC 81 MG tablet Take 1 tablet (81 mg total) by mouth daily. 90 tablet 3  . atorvastatin (LIPITOR) 40 MG tablet Take 1 tablet by mouth daily.    . baclofen (LIORESAL) 10 MG tablet Take 1 tablet (10 mg total) by mouth 3 (three) times daily as needed for muscle spasms. 90 each 1  . carvedilol (COREG) 6.25 MG tablet Take 6.25 mg by mouth 2 (two) times daily with a meal.    . Cholecalciferol (VITAMIN D) 2000 units tablet Take 2,000 Units by mouth daily at 12 noon.    . clopidogrel (PLAVIX) 75 MG tablet Take 1 tablet by mouth daily.    . DULoxetine (CYMBALTA) 60 MG capsule Take 60 mg by mouth daily.    . fenofibrate micronized (LOFIBRA) 134 MG capsule Take 134 mg by mouth daily before breakfast.     . fluticasone (CUTIVATE) 0.05 % cream Apply 1 application topically 2 (two) times daily.    . furosemide (LASIX) 20 MG tablet Take 20 mg by mouth.    . gabapentin (NEURONTIN) 600 MG tablet Take 1 tablet by mouth 3 (three) times daily.    . hydrOXYzine (ATARAX/VISTARIL) 25 MG tablet Take 25 mg by mouth 2 (two) times daily.    . isosorbide mononitrate (IMDUR) 60 MG 24 hr tablet Take 60 mg by mouth 2 (two) times daily.     . nitroGLYCERIN (NITROSTAT) 0.4 MG SL tablet Place 0.4 mg under the tongue every 5 (five) minutes as needed. For chest pain    . oxyCODONE (OXY IR/ROXICODONE) 5 MG immediate release tablet Take 5 mg by mouth every 8 (eight) hours as needed for severe pain.    . pantoprazole (PROTONIX) 40 MG tablet Take 40 mg by mouth daily.    [name] PARoxetine (PAXIL) 20 MG tablet Take  40 mg by mouth daily.     . phentermine 37.5 MG capsule Take 1 capsule by mouth daily.    . risperiDONE (RISPERDAL) 0.25 MG tablet     . sacubitril-valsartan (ENTRESTO) 49-51 MG TAKE (1) TABLET TWICE DAILY. 60 tablet 6  . vitamin B-12 (CYANOCOBALAMIN) 1000 MCG tablet Take 1,000 mcg by mouth daily at 12 noon.     No current facility-administered medications on file prior to visit.     ALLERGIES: Allergies  Allergen Reactions  . Contrast Media [Iodinated Diagnostic Agents] Anaphylaxis, Shortness Of Breath, Swelling and Rash    Isovue contrast is most acceptable agent based on previous experience and testing with premedications  . Ibuprofen Anaphylaxis, Hives and Swelling  . Nsaids Anaphylaxis, Hives, Swelling and Rash    FAMILY HISTORY: Family  History  Problem Relation Age of Onset  . Lung cancer Father   . Heart disease Father   . Heart disease Mother   . Congestive Heart Failure Mother   . Diabetes Mother   . Hyperlipidemia Mother   . Breast cancer Maternal Aunt     SOCIAL HISTORY: Social History   Socioeconomic History  . Marital status: Legally Separated    Spouse name: Jolayne Haines  . Number of children: 2  . Years of education: Not on file  . Highest education level: 12th grade  Occupational History  . Occupation: disabled  Social Needs  . Financial resource strain: Not on file  . Food insecurity:    Worry: Not on file    Inability: Not on file  . Transportation needs:    Medical: Not on file    Non-medical: Not on file  Tobacco Use  . Smoking status: Current Every Day Smoker    Packs/day: 1.50    Years: 30.00    Pack years: 45.00    Types: Cigarettes    Start date: 06/08/1982  . Smokeless tobacco: Never Used  . Tobacco comment: 1 pack per day 03/04/2018  Substance and Sexual Activity  . Alcohol use: No  . Drug use: No  . Sexual activity: Yes    Birth control/protection: None  Lifestyle  . Physical activity:    Days per week: Not on file    Minutes per  session: Not on file  . Stress: Not on file  Relationships  . Social connections:    Talks on phone: Not on file    Gets together: Not on file    Attends religious service: Not on file    Active member of club or organization: Not on file    Attends meetings of clubs or organizations: Not on file    Relationship status: Not on file  . Intimate partner violence:    Fear of current or ex partner: Not on file    Emotionally abused: Not on file    Physically abused: Not on file    Forced sexual activity: Not on file  Other Topics Concern  . Not on file  Social History Narrative   Disabled since age 41 lives with wife and children      Patient is right-handed. He is married, lives in a 1 story house, has a ramp to enter. Drinks 64oz of Mtn. Dew a day. Prior to CVA was walking daily. 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    REVIEW OF SYSTEMS: Constitutional: No fevers, chills, or sweats, no generalized fatigue, change in appetite Eyes: No visual changes, double vision, eye pain Ear, nose and throat: No hearing loss, ear pain, nasal congestion, sore throat Cardiovascular: No chest pain, palpitations Respiratory:  No shortness of breath at rest or with exertion, wheezes GastrointestinaI: No nausea, vomiting, diarrhea, abdominal pain, fecal incontinence Genitourinary:  No dysuria, urinary retention or frequency Musculoskeletal:  Left arm spasticity Integumentary: No rash, pruritus, skin lesions Neurological: as above Psychiatric: depression, anxiety Endocrine: No palpitations, fatigue, diaphoresis, mood swings, change in appetite, change in weight, increased thirst Hematologic/Lymphatic:  No purpura, petechiae. Allergic/Immunologic: no itchy/runny eyes, nasal congestion, recent allergic reactions, rashes  PHYSICAL EXAM: Blood pressure (!) 94/52, pulse 77, height 5\' 11"  (1.803 m), weight 270 lb (122.5 kg), SpO2 96 %. General: No acute distress.  Patient appears  well-groomed.   Head:  Normocephalic/atraumatic Eyes:  Fundi examined but not visualized Neck: supple, no paraspinal tenderness,  full range of motion Heart:  Regular rate and rhythm Lungs:  Clear to auscultation bilaterally Back: No paraspinal tenderness Neurological Exam: alert and oriented to person, place, and time. Attention span and concentration decreased, recent memory impaired, remote memory intact, fund of knowledge intact.  Speech fluent and not dysarthric, language intact.  Decreased left V2.  Otherwise, CN II-XII intact. Bulk and tone flaccid in left upper extremity; muscle strength plegic left upper extremity except 2+/5 hand grip, 2+/5 left hip flexion, plegic left knee flexion, left foot drop, left knee extension and right side 5/5.  Sensation to pinprick decreased in left lower extremity, decreased vibratory sensation bilateral lower extremities..  Deep tendon reflexes 3+ left upper and lower extremities with sustained clonus in left ankle, 2+ on right. Finger to nose testing intact on right, unable to assess left.  Gait:  In wheelchair.  IMPRESSION: 1.  Hemiplegia as late effect of stroke, likely secondary to small vessel disease. 2.  Vascular cognitive impairment 3.  HTN 4.  Hyperlipidemia 5.  Cardiomyopathy 6.  Type 2 diabetes mellitus 7.  Tobacco use disorder 8.  Anxiety  PLAN: Secondary stroke prevention as managed by PCP: 1.  Continue ASA 81mg  daily and Plavix 75mg  daily  2.  Continue atorvastatin (LDL goal less than 70) 3.  In addition to PT, agree with speech/cognitive therapy 4.  Smoking cessation 5.  Continue psychiatric care 6.  Follow up in one year  25 minutes spent face to face with patient, over 50% spent discussing management   Metta Clines, DO  CC:  Gar Ponto, MD

## 2018-08-12 ENCOUNTER — Encounter: Payer: Self-pay | Admitting: Neurology

## 2018-08-12 ENCOUNTER — Ambulatory Visit (INDEPENDENT_AMBULATORY_CARE_PROVIDER_SITE_OTHER): Payer: Medicare HMO | Admitting: Neurology

## 2018-08-12 VITALS — BP 94/52 | HR 77 | Ht 71.0 in | Wt 270.0 lb

## 2018-08-12 DIAGNOSIS — I10 Essential (primary) hypertension: Secondary | ICD-10-CM

## 2018-08-12 DIAGNOSIS — F0151 Vascular dementia with behavioral disturbance: Secondary | ICD-10-CM

## 2018-08-12 DIAGNOSIS — E118 Type 2 diabetes mellitus with unspecified complications: Secondary | ICD-10-CM

## 2018-08-12 DIAGNOSIS — E785 Hyperlipidemia, unspecified: Secondary | ICD-10-CM

## 2018-08-12 DIAGNOSIS — F17200 Nicotine dependence, unspecified, uncomplicated: Secondary | ICD-10-CM

## 2018-08-12 DIAGNOSIS — F419 Anxiety disorder, unspecified: Secondary | ICD-10-CM

## 2018-08-12 DIAGNOSIS — F0391 Unspecified dementia with behavioral disturbance: Secondary | ICD-10-CM

## 2018-08-12 DIAGNOSIS — I69354 Hemiplegia and hemiparesis following cerebral infarction affecting left non-dominant side: Secondary | ICD-10-CM

## 2018-08-12 DIAGNOSIS — Z794 Long term (current) use of insulin: Secondary | ICD-10-CM

## 2018-08-12 NOTE — Patient Instructions (Signed)
1.  Continue aspirin 81mg  daily and Plavix 75mg  daily for secondary stroke prevention. 2.  Continue atorvastatin as managed by Dr. Quillian Quince 3.  Continue physical therapy and speech therapy 4.  Follow up in one year or as needed.

## 2018-08-13 DIAGNOSIS — I69952 Hemiplegia and hemiparesis following unspecified cerebrovascular disease affecting left dominant side: Secondary | ICD-10-CM | POA: Diagnosis not present

## 2018-08-13 DIAGNOSIS — R269 Unspecified abnormalities of gait and mobility: Secondary | ICD-10-CM | POA: Diagnosis not present

## 2018-08-13 DIAGNOSIS — R262 Difficulty in walking, not elsewhere classified: Secondary | ICD-10-CM | POA: Diagnosis not present

## 2018-08-13 DIAGNOSIS — R278 Other lack of coordination: Secondary | ICD-10-CM | POA: Diagnosis not present

## 2018-08-13 DIAGNOSIS — M6281 Muscle weakness (generalized): Secondary | ICD-10-CM | POA: Diagnosis not present

## 2018-08-14 ENCOUNTER — Encounter: Payer: Self-pay | Admitting: Physical Medicine & Rehabilitation

## 2018-08-14 ENCOUNTER — Ambulatory Visit (HOSPITAL_BASED_OUTPATIENT_CLINIC_OR_DEPARTMENT_OTHER): Payer: Medicare HMO | Admitting: Physical Medicine & Rehabilitation

## 2018-08-14 ENCOUNTER — Encounter: Payer: Medicare HMO | Attending: Physical Medicine & Rehabilitation

## 2018-08-14 VITALS — BP 96/65 | HR 69 | Ht 71.0 in | Wt 270.0 lb

## 2018-08-14 DIAGNOSIS — E782 Mixed hyperlipidemia: Secondary | ICD-10-CM | POA: Diagnosis not present

## 2018-08-14 DIAGNOSIS — J449 Chronic obstructive pulmonary disease, unspecified: Secondary | ICD-10-CM | POA: Insufficient documentation

## 2018-08-14 DIAGNOSIS — E119 Type 2 diabetes mellitus without complications: Secondary | ICD-10-CM | POA: Insufficient documentation

## 2018-08-14 DIAGNOSIS — F17210 Nicotine dependence, cigarettes, uncomplicated: Secondary | ICD-10-CM | POA: Diagnosis not present

## 2018-08-14 DIAGNOSIS — I69354 Hemiplegia and hemiparesis following cerebral infarction affecting left non-dominant side: Secondary | ICD-10-CM | POA: Insufficient documentation

## 2018-08-14 DIAGNOSIS — I255 Ischemic cardiomyopathy: Secondary | ICD-10-CM | POA: Diagnosis not present

## 2018-08-14 DIAGNOSIS — M1990 Unspecified osteoarthritis, unspecified site: Secondary | ICD-10-CM | POA: Diagnosis not present

## 2018-08-14 DIAGNOSIS — I2510 Atherosclerotic heart disease of native coronary artery without angina pectoris: Secondary | ICD-10-CM | POA: Insufficient documentation

## 2018-08-14 DIAGNOSIS — I509 Heart failure, unspecified: Secondary | ICD-10-CM | POA: Insufficient documentation

## 2018-08-14 DIAGNOSIS — I252 Old myocardial infarction: Secondary | ICD-10-CM | POA: Insufficient documentation

## 2018-08-14 DIAGNOSIS — G8110 Spastic hemiplegia affecting unspecified side: Secondary | ICD-10-CM | POA: Diagnosis not present

## 2018-08-14 DIAGNOSIS — I110 Hypertensive heart disease with heart failure: Secondary | ICD-10-CM | POA: Diagnosis not present

## 2018-08-14 DIAGNOSIS — F419 Anxiety disorder, unspecified: Secondary | ICD-10-CM | POA: Insufficient documentation

## 2018-08-14 DIAGNOSIS — G4733 Obstructive sleep apnea (adult) (pediatric): Secondary | ICD-10-CM | POA: Diagnosis not present

## 2018-08-14 NOTE — Progress Notes (Signed)
Subjective:    Patient ID: [name], male    DOB: November 07, 1965, 53 y.o.   MRN: [id number]  HPI 12/20 LUE FDS 100 FDP 100 FPL 100 Biceps 100 Brachiorad 100  LLE Medial Hamstrings 300  Med gastroc 100 Lat Gastro 100 Med soleus 100 Lat soleus 100 Tibialis post 300  No issues with injection sites, pt feels UE is relaxed He states his hamstring is still a little tight but he is not doing his hamstring stretches outside of therapy  Pt says he feels the Dysport does not last as long as the botox and we discussed that studies demonstrate a longer duration of response    Pain Inventory Average Pain 6 Pain Right Now 6 My pain is dull and aching  In the last 24 hours, has pain interfered with the following? General activity 6 Relation with others 6 Enjoyment of life 6 What TIME of day is your pain at its worst? varies Sleep (in general) Poor  Pain is worse with: unsure Pain improves with: rest Relief from Meds: none  Mobility walk with assistance use a cane how many minutes can you walk? 5 ability to climb steps?  yes do you drive?  no use a wheelchair needs help with transfers  Function disabled: date disabled na  Neuro/Psych weakness numbness trouble walking depression anxiety  Prior Studies Any changes since last visit?  no  Physicians involved in your care Any changes since last visit?  no   Family History  Problem Relation Age of Onset  . Lung cancer Father   . Heart disease Father   . Heart disease Mother   . Congestive Heart Failure Mother   . Diabetes Mother   . Hyperlipidemia Mother   . Breast cancer Maternal Aunt    Social History   Socioeconomic History  . Marital status: Legally Separated    Spouse name: Jolayne Haines  . Number of children: 2  . Years of education: Not on file  . Highest education level: 12th grade  Occupational History  . Occupation: disabled  Social Needs  . Financial resource strain: Not on file  .  Food insecurity:    Worry: Not on file    Inability: Not on file  . Transportation needs:    Medical: Not on file    Non-medical: Not on file  Tobacco Use  . Smoking status: Current Every Day Smoker    Packs/day: 1.50    Years: 30.00    Pack years: 45.00    Types: Cigarettes    Start date: 06/08/1982  . Smokeless tobacco: Never Used  . Tobacco comment: 1 pack per day 03/04/2018  Substance and Sexual Activity  . Alcohol use: No  . Drug use: No  . Sexual activity: Yes    Birth control/protection: None  Lifestyle  . Physical activity:    Days per week: Not on file    Minutes per session: Not on file  . Stress: Not on file  Relationships  . Social connections:    Talks on phone: Not on file    Gets together: Not on file    Attends religious service: Not on file    Active member of club or organization: Not on file    Attends meetings of clubs or organizations: Not on file    Relationship status: Not on file  Other Topics Concern  . Not on file  Social History Narrative   Disabled since age 47 lives with wife and children  Patient is right-handed. He is married, lives in a 1 story house, has a ramp to enter. Drinks 64oz of Mtn. Dew a day. Prior to CVA was walking daily. 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   Past Surgical History:  Procedure Laterality Date  . CHOLECYSTECTOMY    . CORONARY ARTERY BYPASS GRAFT  2010   LIMA to LAD, SVG to diagonal, SVG to OM1 and OM 2, SVG to RCA  . DENTAL SURGERY  2003  . GASTRIC BYPASS  2010  . HERNIA REPAIR  2011, 2012  . INCISIONAL HERNIA REPAIR N/A 07/23/2013   Procedure: HERNIA REPAIR INCISIONAL WITH MESH;  Surgeon: Jamesetta So, MD;  Location: [facility];  Service: General;  Laterality: N/A;  . INSERTION OF MESH N/A 07/23/2013   Procedure: INSERTION OF MESH;  Surgeon: Jamesetta So, MD;  Location: [facility];  Service: General;  Laterality: N/A;  . LEFT HEART CATHETERIZATION WITH CORONARY/GRAFT ANGIOGRAM N/A 11/01/2013    Procedure: LEFT HEART CATHETERIZATION WITH Beatrix Fetters;  Surgeon: Blane Ohara, MD;  Location[facility] CATH LAB;  Service: Cardiovascular;  Laterality: N/A;  . TOE AMPUTATION  1998   right 1st and 2nd toe  . TONSILECTOMY, ADENOIDECTOMY, BILATERAL MYRINGOTOMY AND TUBES    . TONSILLECTOMY    . VENTRAL HERNIA REPAIR N/A 10/28/2012   Procedure: LAPAROSCOPIC VENTRAL HERNIA;  Surgeon: Donato Heinz, MD;  Location: [facility];  Service: General;  Laterality: N/A;   Past Medical History:  Diagnosis Date  . Anaphylaxis    IGE mediated  . Anxiety   . Arthritis   . Cardiomyopathy ([place])   . CHF (congestive heart failure) ([place])   . COPD (chronic obstructive pulmonary disease) ([place])   . Coronary atherosclerosis of native coronary artery    Multivessel status post CABG 2010  . Depression   . Essential hypertension, benign   . History of CVA (cerebrovascular accident) 01/2012   Right posterior frontal cortical and subcortical brain by MRI, no hemorrhage. Carotid Dopplers showed only 1-50% bilateral ICA stenoses. Echocardiogram showed LVEF 50%, no major valvular abnormalities.  . Mixed hyperlipidemia   . Myocardial infarction ([place]) 2010  . OSA (obstructive sleep apnea)   . Stroke ([place])   . Type 2 diabetes mellitus (HCC)    BP 96/65   Pulse 69   Ht 5\' 11"  (1.803 m)   Wt 270 lb (122.5 kg)   SpO2 98%   BMI 37.66 kg/m   Opioid Risk Score:   Fall Risk Score:  `1  Depression screen PHQ 2/9  Depression screen [facility] 2/9 05/19/2018 09/29/2017  Decreased Interest 1 1  Down, Depressed, Hopeless 1 3  PHQ - 2 Score 2 4  Altered sleeping - 3  Tired, decreased energy - 2  Change in appetite - 3  Feeling bad or failure about yourself  - 3  Trouble concentrating - 3  Moving slowly or fidgety/restless - 2  Suicidal thoughts - 0  PHQ-9 Score - 20  Difficult doing work/chores - Somewhat difficult    Review of Systems  Constitutional: Negative.   HENT: Negative.   Eyes: Negative.     Respiratory: Negative.   Cardiovascular: Negative.   Gastrointestinal: Negative.   Endocrine: Negative.   Genitourinary: Negative.   Musculoskeletal: Negative.   Skin: Negative.   Allergic/Immunologic: Negative.   Neurological: Negative.   Hematological: Negative.   Psychiatric/Behavioral: Negative.   All other systems reviewed and are negative.      Objective:  Physical Exam Vitals signs and nursing note reviewed.  Constitutional:      Appearance: Normal appearance. He is obese.  HENT:     Head: Normocephalic and atraumatic.     Mouth/Throat:     Mouth: Mucous membranes are moist.  Eyes:     Extraocular Movements: Extraocular movements intact.     Conjunctiva/sclera: Conjunctivae normal.     Pupils: Pupils are equal, round, and reactive to light.  Neurological:     General: No focal deficit present.     Mental Status: He is alert and oriented to person, place, and time.     Comments: Motor strength is essentially 0/5 in left upper extremity, 3- left hip flexion 4- left knee extension 0 at the ankle dorsiflexor plantar flexor 5/5 strength in the right side    Tone MAS 0 in the left finger flexors thumb flexor wrist flexor 1 at the elbow flexor MAS 2/3 left hamstring can extend to -30 degrees Ankle to neutral passively no evidence of clonus       Assessment & Plan:  1.  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  Overall the current dosage of Dysport appears to be adequately reducing his tone to allow for therapy participation.  We did discuss he needs to do stretching of the hamstrings outside of therapy at least on a daily basis.  Continue current dose of Dysport with same muscle group selection.  Return in 6 weeks for repeat

## 2018-08-17 DIAGNOSIS — G894 Chronic pain syndrome: Secondary | ICD-10-CM | POA: Diagnosis not present

## 2018-08-17 DIAGNOSIS — I69952 Hemiplegia and hemiparesis following unspecified cerebrovascular disease affecting left dominant side: Secondary | ICD-10-CM | POA: Diagnosis not present

## 2018-08-17 DIAGNOSIS — I639 Cerebral infarction, unspecified: Secondary | ICD-10-CM | POA: Diagnosis not present

## 2018-08-17 DIAGNOSIS — Z79899 Other long term (current) drug therapy: Secondary | ICD-10-CM | POA: Diagnosis not present

## 2018-08-17 DIAGNOSIS — F1120 Opioid dependence, uncomplicated: Secondary | ICD-10-CM | POA: Diagnosis not present

## 2018-08-17 DIAGNOSIS — M6281 Muscle weakness (generalized): Secondary | ICD-10-CM | POA: Diagnosis not present

## 2018-08-17 DIAGNOSIS — R262 Difficulty in walking, not elsewhere classified: Secondary | ICD-10-CM | POA: Diagnosis not present

## 2018-08-17 DIAGNOSIS — R269 Unspecified abnormalities of gait and mobility: Secondary | ICD-10-CM | POA: Diagnosis not present

## 2018-08-26 DIAGNOSIS — M6281 Muscle weakness (generalized): Secondary | ICD-10-CM | POA: Diagnosis not present

## 2018-08-26 DIAGNOSIS — R269 Unspecified abnormalities of gait and mobility: Secondary | ICD-10-CM | POA: Diagnosis not present

## 2018-08-26 DIAGNOSIS — I69952 Hemiplegia and hemiparesis following unspecified cerebrovascular disease affecting left dominant side: Secondary | ICD-10-CM | POA: Diagnosis not present

## 2018-08-26 DIAGNOSIS — R262 Difficulty in walking, not elsewhere classified: Secondary | ICD-10-CM | POA: Diagnosis not present

## 2018-08-28 DIAGNOSIS — R269 Unspecified abnormalities of gait and mobility: Secondary | ICD-10-CM | POA: Diagnosis not present

## 2018-08-28 DIAGNOSIS — M6281 Muscle weakness (generalized): Secondary | ICD-10-CM | POA: Diagnosis not present

## 2018-08-28 DIAGNOSIS — R262 Difficulty in walking, not elsewhere classified: Secondary | ICD-10-CM | POA: Diagnosis not present

## 2018-08-28 DIAGNOSIS — I69952 Hemiplegia and hemiparesis following unspecified cerebrovascular disease affecting left dominant side: Secondary | ICD-10-CM | POA: Diagnosis not present

## 2018-09-01 DIAGNOSIS — R262 Difficulty in walking, not elsewhere classified: Secondary | ICD-10-CM | POA: Diagnosis not present

## 2018-09-01 DIAGNOSIS — R269 Unspecified abnormalities of gait and mobility: Secondary | ICD-10-CM | POA: Diagnosis not present

## 2018-09-01 DIAGNOSIS — M6281 Muscle weakness (generalized): Secondary | ICD-10-CM | POA: Diagnosis not present

## 2018-09-01 DIAGNOSIS — I69952 Hemiplegia and hemiparesis following unspecified cerebrovascular disease affecting left dominant side: Secondary | ICD-10-CM | POA: Diagnosis not present
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
## 2018-09-03 DIAGNOSIS — R262 Difficulty in walking, not elsewhere classified: Secondary | ICD-10-CM | POA: Diagnosis not present

## 2018-09-03 DIAGNOSIS — M6281 Muscle weakness (generalized): Secondary | ICD-10-CM | POA: Diagnosis not present

## 2018-09-03 DIAGNOSIS — R269 Unspecified abnormalities of gait and mobility: Secondary | ICD-10-CM | POA: Diagnosis not present

## 2018-09-03 DIAGNOSIS — I69952 Hemiplegia and hemiparesis following unspecified cerebrovascular disease affecting left dominant side: Secondary | ICD-10-CM | POA: Diagnosis not present

## 2018-09-08 DIAGNOSIS — R269 Unspecified abnormalities of gait and mobility: Secondary | ICD-10-CM | POA: Diagnosis not present

## 2018-09-08 DIAGNOSIS — M6281 Muscle weakness (generalized): Secondary | ICD-10-CM | POA: Diagnosis not present

## 2018-09-08 DIAGNOSIS — R262 Difficulty in walking, not elsewhere classified: Secondary | ICD-10-CM | POA: Diagnosis not present

## 2018-09-08 DIAGNOSIS — I69952 Hemiplegia and hemiparesis following unspecified cerebrovascular disease affecting left dominant side: Secondary | ICD-10-CM | POA: Diagnosis not present

## 2018-09-09 ENCOUNTER — Encounter (HOSPITAL_COMMUNITY): Payer: Self-pay | Admitting: Psychiatry

## 2018-09-09 ENCOUNTER — Ambulatory Visit (INDEPENDENT_AMBULATORY_CARE_PROVIDER_SITE_OTHER): Payer: Medicare HMO | Admitting: Psychiatry

## 2018-09-09 VITALS — BP 103/66 | HR 80 | Ht 70.98 in | Wt 270.0 lb

## 2018-09-09 DIAGNOSIS — F331 Major depressive disorder, recurrent, moderate: Secondary | ICD-10-CM

## 2018-09-09 MED ORDER — PAROXETINE HCL 40 MG PO TABS: 40.0000 mg | ORAL_TABLET | Freq: Every day | ORAL | 0 refills | Status: DC

## 2018-09-09 MED ORDER — DULOXETINE HCL 60 MG PO CPEP: 60.0000 mg | ORAL_CAPSULE | Freq: Every day | ORAL | 0 refills | Status: DC

## 2018-09-09 MED ORDER — PAROXETINE HCL 20 MG PO TABS: 20.0000 mg | ORAL_TABLET | Freq: Every day | ORAL | 0 refills | Status: DC

## 2018-09-09 MED ORDER — ALPRAZOLAM 1 MG PO TABS: 1.0000 mg | ORAL_TABLET | Freq: Every day | ORAL | 0 refills | Status: DC | PRN

## 2018-09-09 NOTE — Patient Instructions (Signed)
1. Continue duloxetine 60 mg daily  2. Continue Paxil 40 mg daily  3. Continue Xanax 1 mg daily as needed for anxiety  4. Return to clinic in one month for 30 mins 5. Referral to therapy

## 2018-09-10 DIAGNOSIS — I2510 Atherosclerotic heart disease of native coronary artery without angina pectoris: Secondary | ICD-10-CM | POA: Diagnosis not present

## 2018-09-10 DIAGNOSIS — R269 Unspecified abnormalities of gait and mobility: Secondary | ICD-10-CM | POA: Diagnosis not present

## 2018-09-10 DIAGNOSIS — J449 Chronic obstructive pulmonary disease, unspecified: Secondary | ICD-10-CM | POA: Diagnosis not present

## 2018-09-10 DIAGNOSIS — I69952 Hemiplegia and hemiparesis following unspecified cerebrovascular disease affecting left dominant side: Secondary | ICD-10-CM | POA: Diagnosis not present

## 2018-09-10 DIAGNOSIS — I69398 Other sequelae of cerebral infarction: Secondary | ICD-10-CM | POA: Diagnosis not present

## 2018-09-10 DIAGNOSIS — R262 Difficulty in walking, not elsewhere classified: Secondary | ICD-10-CM | POA: Diagnosis not present

## 2018-09-10 DIAGNOSIS — M6281 Muscle weakness (generalized): Secondary | ICD-10-CM | POA: Diagnosis not present

## 2018-09-11 ENCOUNTER — Emergency Department (HOSPITAL_COMMUNITY)
Admission: EM | Admit: 2018-09-11 | Discharge: 2018-09-11 | Disposition: A | Discharge status: Home / Self Care | Payer: Medicare HMO | Attending: Emergency Medicine | Admitting: Emergency Medicine

## 2018-09-11 ENCOUNTER — Emergency Department (HOSPITAL_COMMUNITY): Payer: Medicare HMO

## 2018-09-11 ENCOUNTER — Encounter (HOSPITAL_COMMUNITY): Payer: Self-pay | Admitting: Emergency Medicine

## 2018-09-11 ENCOUNTER — Other Ambulatory Visit: Payer: Self-pay

## 2018-09-11 DIAGNOSIS — Z7982 Long term (current) use of aspirin: Secondary | ICD-10-CM | POA: Insufficient documentation

## 2018-09-11 DIAGNOSIS — J449 Chronic obstructive pulmonary disease, unspecified: Secondary | ICD-10-CM | POA: Diagnosis not present

## 2018-09-11 DIAGNOSIS — I2510 Atherosclerotic heart disease of native coronary artery without angina pectoris: Secondary | ICD-10-CM | POA: Diagnosis not present

## 2018-09-11 DIAGNOSIS — M4726 Other spondylosis with radiculopathy, lumbar region: Secondary | ICD-10-CM | POA: Diagnosis not present

## 2018-09-11 DIAGNOSIS — M25519 Pain in unspecified shoulder: Secondary | ICD-10-CM | POA: Diagnosis not present

## 2018-09-11 DIAGNOSIS — I509 Heart failure, unspecified: Secondary | ICD-10-CM | POA: Insufficient documentation

## 2018-09-11 DIAGNOSIS — R531 Weakness: Secondary | ICD-10-CM | POA: Diagnosis not present

## 2018-09-11 DIAGNOSIS — F17210 Nicotine dependence, cigarettes, uncomplicated: Secondary | ICD-10-CM | POA: Insufficient documentation

## 2018-09-11 DIAGNOSIS — Z8673 Personal history of transient ischemic attack (TIA), and cerebral infarction without residual deficits: Secondary | ICD-10-CM | POA: Insufficient documentation

## 2018-09-11 DIAGNOSIS — M545 Low back pain: Secondary | ICD-10-CM | POA: Diagnosis present

## 2018-09-11 DIAGNOSIS — Z79899 Other long term (current) drug therapy: Secondary | ICD-10-CM | POA: Diagnosis not present

## 2018-09-11 DIAGNOSIS — Z951 Presence of aortocoronary bypass graft: Secondary | ICD-10-CM | POA: Diagnosis not present

## 2018-09-11 DIAGNOSIS — I110 Hypertensive heart disease with heart failure: Secondary | ICD-10-CM | POA: Insufficient documentation

## 2018-09-11 DIAGNOSIS — Z7902 Long term (current) use of antithrombotics/antiplatelets: Secondary | ICD-10-CM | POA: Diagnosis not present

## 2018-09-11 DIAGNOSIS — M549 Dorsalgia, unspecified: Secondary | ICD-10-CM | POA: Diagnosis not present

## 2018-09-11 DIAGNOSIS — Z89421 Acquired absence of other right toe(s): Secondary | ICD-10-CM | POA: Insufficient documentation

## 2018-09-11 DIAGNOSIS — S3992XA Unspecified injury of lower back, initial encounter: Secondary | ICD-10-CM | POA: Diagnosis not present

## 2018-09-11 MED ORDER — OXYCODONE-ACETAMINOPHEN 5-325 MG PO TABS
2.0000 | ORAL_TABLET | Freq: Once | ORAL | Status: AC
  Administered 2018-09-11: 2 via ORAL
  Filled 2018-09-11: qty 2

## 2018-09-11 MED ORDER — PREDNISONE 50 MG PO TABS
60.0000 mg | ORAL_TABLET | Freq: Once | ORAL | Status: AC
  Administered 2018-09-11: 60 mg via ORAL
  Filled 2018-09-11: qty 1

## 2018-09-11 MED ORDER — PREDNISONE 10 MG PO TABS: ORAL_TABLET | ORAL | 0 refills | Status: DC

## 2018-09-11 NOTE — Discharge Instructions (Addendum)
Take your next dose of prednisone tomorrow morning.  Use the the other medicines as directed.  Do not drive within 4 hours of taking oxycodone as this will make you drowsy.  Avoid lifting,  Bending,  Twisting or any other activity that worsens your pain over the next week.  Apply a heating pad to your lower back for 20 minutes 3 times daily to help relax the muscles and heal this suspected irritated nerve.   You should get rechecked if your symptoms are not better over the next 5 days,  Or you develop increased pain,  Weakness in your leg(s) or loss of bladder or bowel function - these are symptoms of a worse injury.

## 2018-09-11 NOTE — ED Provider Notes (Signed)
[facility] EMERGENCY DEPARTMENT Provider Note   CSN: [id number] Arrival date & time: 09/11/18  1444    History   Chief Complaint Chief Complaint  Patient presents with  . Back Pain    HPI [name] is a 53 y.o. 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 His pain has worsened this week and is interfering with his ability to participate in his stroke rehab work.  He does report urinary retention, but states this is a chronic finding since his stroke, not worse since his fall, also denies weakness in his right leg, but does have great difficulty walking more than a few steps due to pain.  He currently takes oxycodone 5 mg tablets qid for his left sided neuropathic pain but this does not relieve his low back pain.  He has found no other alleviators.  He denies urinary or fecal incontinence, denies perineal numbness. No fevers or chills, no IVDU.     The history is provided by the patient.    Past Medical History:  Diagnosis Date  . Anaphylaxis    IGE mediated  . Anxiety   . Arthritis   . Cardiomyopathy ([place])   . CHF (congestive heart failure) ([place])   . COPD (chronic obstructive pulmonary disease) ([place])   . Coronary atherosclerosis of native coronary artery    Multivessel status post CABG 2010  . Depression   . Essential hypertension, benign   . History of CVA (cerebrovascular accident) 01/2012   Right posterior frontal cortical and subcortical brain by MRI, no hemorrhage. Carotid Dopplers showed only 1-50% bilateral ICA stenoses. Echocardiogram showed LVEF 50%, no major valvular abnormalities.  . Mixed hyperlipidemia   . Myocardial infarction ([place]) 2010  . OSA  (obstructive sleep apnea)   . Stroke ([place])   . Type 2 diabetes mellitus [facility])     Patient Active Problem List   Diagnosis Date Noted  . NSTEMI (non-ST elevated myocardial infarction) ([place]) 04/28/2014  . Secondary cardiomyopathy ([place]) 12/31/2013  . History of noncompliance with medical treatment 10/31/2013  . Obesity (BMI 30-39.9) 10/31/2013  . Sleep apnea 10/31/2013  . Anxiety   . COPD (chronic obstructive pulmonary disease) ([place])   . History of stroke 02/07/2012  . Tobacco abuse   . Essential hypertension, benign 08/16/2008  . Hyperlipidemia   . Coronary atherosclerosis of native coronary artery     Past Surgical History:  Procedure Laterality Date  . CHOLECYSTECTOMY    . CORONARY ARTERY BYPASS GRAFT  2010   LIMA to LAD, SVG to diagonal, SVG to OM1 and OM 2, SVG to RCA  . DENTAL SURGERY  2003  . GASTRIC BYPASS  2010  . HERNIA REPAIR  2011, 2012  . INCISIONAL HERNIA REPAIR N/A 07/23/2013   Procedure: HERNIA REPAIR INCISIONAL WITH MESH;  Surgeon: Jamesetta So, MD;  Location: [facility];  Service: General;  Laterality: N/A;  . INSERTION OF MESH N/A 07/23/2013   Procedure: INSERTION OF MESH;  Surgeon: Jamesetta So, MD;  Location: [facility];  Service: General;  Laterality: N/A;  . LEFT HEART CATHETERIZATION WITH CORONARY/GRAFT ANGIOGRAM N/A 11/01/2013   Procedure: LEFT HEART CATHETERIZATION WITH Beatrix Fetters;  Surgeon: Legrand Como  Ree Kida, MD;  Location: [facility] CATH LAB;  Service: Cardiovascular;  Laterality: N/A;  . TOE AMPUTATION  1998   right 1st and 2nd toe  . TONSILECTOMY, ADENOIDECTOMY, BILATERAL MYRINGOTOMY AND TUBES    . TONSILLECTOMY    . VENTRAL HERNIA REPAIR N/A 10/28/2012   Procedure: LAPAROSCOPIC VENTRAL HERNIA;  Surgeon: Donato Heinz, MD;  Location: [facility];  Service: General;  Laterality: N/A;        Home Medications    Prior to Admission medications   Medication Sig Start Date End Date Taking? Authorizing Provider  albuterol (PROVENTIL HFA;VENTOLIN HFA) 108  (90 BASE) MCG/ACT inhaler Inhale 2 puffs into the lungs every 6 (six) hours as needed for wheezing or shortness of breath.    [provider]  ALPRAZolam Duanne Moron) 1 MG tablet Take 1 tablet (1 mg total) by mouth daily as needed for anxiety. 09/15/18   [name], MD  aspirin EC 81 MG tablet Take 1 tablet (81 mg total) by mouth daily. 03/04/18   Satira Sark, MD  atorvastatin (LIPITOR) 40 MG tablet Take 1 tablet by mouth daily. 12/06/17   [provider]  baclofen (LIORESAL) 10 MG tablet Take 1 tablet (10 mg total) by mouth 3 (three) times daily as needed for muscle spasms. 08/27/17   Tomi Likens, Adam R, DO  carvedilol (COREG) 6.25 MG tablet Take 6.25 mg by mouth 2 (two) times daily with a meal.    [provider]  Cholecalciferol (VITAMIN D) 2000 units tablet Take 2,000 Units by mouth daily at 12 noon. 07/29/17   [provider]  clopidogrel (PLAVIX) 75 MG tablet Take 1 tablet by mouth daily. 12/06/17   [provider]  DULoxetine (CYMBALTA) 60 MG capsule Take 1 capsule (60 mg total) by mouth daily. 09/09/18   [name], MD  fenofibrate micronized (LOFIBRA) 134 MG capsule Take 134 mg by mouth daily before breakfast.     [provider]  fluticasone (CUTIVATE) 0.05 % cream Apply 1 application topically 2 (two) times daily.    [provider]  furosemide (LASIX) 20 MG tablet Take 20 mg by mouth.    [provider]  gabapentin (NEURONTIN) 600 MG tablet Take 1 tablet by mouth 3 (three) times daily. 11/07/17   [provider]  isosorbide mononitrate (IMDUR) 60 MG 24 hr tablet Take 60 mg by mouth 2 (two) times daily.     [provider]  nitroGLYCERIN (NITROSTAT) 0.4 MG SL tablet Place 0.4 mg under the tongue every 5 (five) minutes as needed. For chest pain    [provider]  oxyCODONE (OXY IR/ROXICODONE) 5 MG immediate release tablet Take 5 mg by mouth every 8 (eight) hours as needed for severe pain.     [provider]  pantoprazole (PROTONIX) 40 MG tablet Take 40 mg by mouth daily.    [provider]  PARoxetine (PAXIL) 20 MG tablet Take 1 tablet (20 mg total) by mouth daily. 09/09/18   [name], MD  phentermine 37.5 MG capsule Take 1 capsule by mouth daily.    [provider]  predniSONE (DELTASONE) 10 MG tablet Take 6 tablets day one, 5 tablets day two, 4 tablets day three, 3 tablets day four, 2 tablets day five, then 1 tablet day six 09/11/18   Lunette Tapp, Almyra Free, PA-C  sacubitril-valsartan (ENTRESTO) 49-51 MG TAKE (1) TABLET TWICE DAILY. 03/06/18   Satira Sark, MD  vitamin B-12 (CYANOCOBALAMIN) 1000 MCG tablet Take 1,000 mcg by mouth daily  at 12 noon. 07/29/17   [provider]    Family History Family History  Problem Relation Age of Onset  . Lung cancer Father   . Heart disease Father   . Heart disease Mother   . Congestive Heart Failure Mother   . Diabetes Mother   . Hyperlipidemia Mother   . Alcohol abuse Brother   . Breast cancer Maternal Aunt   . Suicidality Cousin     Social History Social History   Tobacco Use  . Smoking status: Current Every Day Smoker    Packs/day: 1.50    Years: 30.00    Pack years: 45.00    Types: Cigarettes    Start date: 06/08/1982  . Smokeless tobacco: Never Used  . Tobacco comment: 1 pack per day 03/04/2018  Substance Use Topics  . Alcohol use: No  . Drug use: No     Allergies   Contrast media [iodinated diagnostic agents]; Ibuprofen; and Nsaids   Review of Systems Review of Systems  Constitutional: Negative for fever.  Respiratory: Negative for shortness of breath.   Cardiovascular: Negative for chest pain and leg swelling.  Gastrointestinal: Negative for abdominal distention, abdominal pain and constipation.  Genitourinary: Negative for decreased urine volume, difficulty urinating, dysuria, flank pain, frequency and urgency.  Musculoskeletal: Positive for back pain. Negative for gait  problem and joint swelling.  Skin: Negative for rash.  Neurological: Positive for weakness. Negative for numbness.       Weakness is chronic     Physical Exam Updated Vital Signs BP 98/71 (BP Location: Left Arm)   Pulse 78   Temp 98.1 F (36.7 C) (Oral)   Resp 20   Ht 5\' 11"  (1.803 m)   Wt 119.3 kg   SpO2 96%   BMI 36.68 kg/m   Physical Exam Vitals signs and nursing note reviewed.  Constitutional:      Appearance: He is well-developed.  HENT:     Head: Normocephalic.  Eyes:     Conjunctiva/sclera: Conjunctivae normal.  Neck:     Musculoskeletal: Normal range of motion and neck supple.  Cardiovascular:     Rate and Rhythm: Normal rate.     Comments: Pedal pulses normal. Pulmonary:     Effort: Pulmonary effort is normal.  Abdominal:     General: Bowel sounds are normal. There is no distension.     Palpations: Abdomen is soft. There is no mass.  Musculoskeletal: Normal range of motion.        General: No deformity.     Lumbar back: He exhibits bony tenderness. He exhibits no swelling, no edema and no spasm.     Right lower leg: No edema.  Skin:    General: Skin is warm and dry.  Neurological:     Mental Status: He is alert.     Sensory: No sensory deficit.     Motor: No tremor or atrophy.     Deep Tendon Reflexes: Babinski sign absent on the right side.     Reflex Scores:      Patellar reflexes are 1+ on the right side and 0 on the left side.      Achilles reflexes are 1+ on the right side.    Comments: No strength deficit noted in hip and knee flexor and extensor muscle groups.  Ankle flexion and extension intact. Chronic foot drop left, in splint.  Pt presents with wheelchair, can ambulate a few steps without assistance. Positive SLR right.  ED Treatments / Results  Labs (all labs ordered are listed, but only abnormal results are displayed) Labs Reviewed - No data to display  EKG None  Radiology Dg Lumbar Spine Complete  Result Date:  09/11/2018 CLINICAL DATA:  Fall.  Right leg radiculopathy.  Back pain. EXAM: LUMBAR SPINE - COMPLETE 4+ VIEW COMPARISON:  07/30/2016 FINDINGS: Normal alignment. No fracture. Degenerative disc disease changes in the lower thoracic spine. SI joints symmetric and unremarkable. IMPRESSION: No acute bony abnormality. Electronically Signed   By: Rolm Baptise M.D.   On: 09/11/2018 17:42   Ct Lumbar Spine Wo Contrast  Result Date: 09/11/2018 CLINICAL DATA:  Radiculopathy, shoulder pain EXAM: CT LUMBAR SPINE WITHOUT CONTRAST TECHNIQUE: Multidetector CT imaging of the lumbar spine was performed without intravenous contrast administration. Multiplanar CT image reconstructions were also generated. COMPARISON:  CT abdomen 07/16/2014 FINDINGS: Segmentation: 5 lumbar type vertebrae. Alignment: Normal. Vertebrae: Generalized osteopenia. Vertebral body heights are maintained. No aggressive osseous lesion. No discitis or osteomyelitis. Paraspinal and other soft tissues: Normal caliber abdominal aorta with atherosclerosis. No acute paraspinal abnormality. Disc levels: Disc spaces are maintained. Moderate bilateral facet arthropathy at L1-2. Moderate bilateral facet arthropathy at L2-3, L3-4, L4-5 and L5-S1. Other: Mild osteoarthritis of bilateral sacroiliac joints. IMPRESSION: 1.  No acute osseous injury of the lumbar spine. 2.  Aortic Atherosclerosis (ICD10-I70.0). Electronically Signed   By: Kathreen Devoid   On: 09/11/2018 19:45    Procedures Procedures (including critical care time)  Medications Ordered in ED Medications  oxyCODONE-acetaminophen (PERCOCET/ROXICET) 5-325 MG per tablet 2 tablet (2 tablets Oral Given 09/11/18 1827)  predniSONE (DELTASONE) tablet 60 mg (60 mg Oral Given 09/11/18 2017)     Initial Impression / Assessment and Plan / ED Course  I have reviewed the triage vital signs and the nursing notes.  Pertinent labs & imaging results that were available during my care of the patient were reviewed by  me and considered in my medical decision making (see chart for details).        Prior to discharge patient also asked for a sling rest his left arm which is chronically weak and for which he is currently undergoing physical therapy for.  He reports pain in the shoulder which is chronic and believes it is from the weight and heaviness of his arm.  He was provided a sling, but advised not completely dependent upon it, to make sure he is maintaining range of motion and strength in that arm.  Patient understands.  Pt with difficult exam with chronic left sided weakness and chronic urinary retention (which is not worsened beyond his baseline with escalation of the right sided pain since his fall.  His right sided sx appear to be purely sciatica without neurologic deficit. CT imaging negative for acute obvious injury, disc spaces maintained.  Discussed with pt that he may need mri if sx persist or worsen. Doubt cauda equina. He reports has appt with his chronic pain specialist in 4 days. In the interim, will give prednisone taper. Continue home oxycodone, baclofen, discussed heat tx.    Final Clinical Impressions(s) / ED Diagnoses   Final diagnoses:  Osteoarthritis of spine with radiculopathy, lumbar region    ED Discharge Orders         Ordered    predniSONE (DELTASONE) 10 MG tablet     09/11/18 1959           Landis Martins 09/11/18 2023    Nat Christen, MD 09/12/18 1455

## 2018-09-11 NOTE — ED Notes (Signed)
Patient transported to MRI 

## 2018-09-11 NOTE — ED Triage Notes (Signed)
Patient reports back and shoulder pain. Has been going to PT but states his pain is worse. Onset "a few days ago."

## 2018-09-14 DIAGNOSIS — M6281 Muscle weakness (generalized): Secondary | ICD-10-CM | POA: Diagnosis not present

## 2018-09-14 DIAGNOSIS — R262 Difficulty in walking, not elsewhere classified: Secondary | ICD-10-CM | POA: Diagnosis not present

## 2018-09-14 DIAGNOSIS — R279 Unspecified lack of coordination: Secondary | ICD-10-CM | POA: Diagnosis not present

## 2018-09-14 DIAGNOSIS — I69952 Hemiplegia and hemiparesis following unspecified cerebrovascular disease affecting left dominant side: Secondary | ICD-10-CM | POA: Diagnosis not present

## 2018-09-14 DIAGNOSIS — R269 Unspecified abnormalities of gait and mobility: Secondary | ICD-10-CM | POA: Diagnosis not present

## 2018-09-15 DIAGNOSIS — M545 Low back pain: Secondary | ICD-10-CM | POA: Diagnosis not present

## 2018-09-15 DIAGNOSIS — Z79899 Other long term (current) drug therapy: Secondary | ICD-10-CM | POA: Diagnosis not present

## 2018-09-15 DIAGNOSIS — F1120 Opioid dependence, uncomplicated: Secondary | ICD-10-CM | POA: Diagnosis not present

## 2018-09-18 ENCOUNTER — Other Ambulatory Visit: Payer: Self-pay | Admitting: Nurse Practitioner

## 2018-09-18 DIAGNOSIS — M545 Low back pain, unspecified: Secondary | ICD-10-CM

## 2018-09-22 DIAGNOSIS — M6281 Muscle weakness (generalized): Secondary | ICD-10-CM | POA: Diagnosis not present

## 2018-09-22 DIAGNOSIS — I69952 Hemiplegia and hemiparesis following unspecified cerebrovascular disease affecting left dominant side: Secondary | ICD-10-CM | POA: Diagnosis not present

## 2018-09-22 DIAGNOSIS — R262 Difficulty in walking, not elsewhere classified: Secondary | ICD-10-CM | POA: Diagnosis not present

## 2018-09-22 DIAGNOSIS — R279 Unspecified lack of coordination: Secondary | ICD-10-CM | POA: Diagnosis not present

## 2018-09-22 DIAGNOSIS — R269 Unspecified abnormalities of gait and mobility: Secondary | ICD-10-CM | POA: Diagnosis not present

## 2018-09-24 DIAGNOSIS — R262 Difficulty in walking, not elsewhere classified: Secondary | ICD-10-CM | POA: Diagnosis not present

## 2018-09-24 DIAGNOSIS — M6281 Muscle weakness (generalized): Secondary | ICD-10-CM | POA: Diagnosis not present

## 2018-09-24 DIAGNOSIS — R279 Unspecified lack of coordination: Secondary | ICD-10-CM | POA: Diagnosis not present

## 2018-09-24 DIAGNOSIS — I69952 Hemiplegia and hemiparesis following unspecified cerebrovascular disease affecting left dominant side: Secondary | ICD-10-CM | POA: Diagnosis not present

## 2018-09-24 DIAGNOSIS — R269 Unspecified abnormalities of gait and mobility: Secondary | ICD-10-CM | POA: Diagnosis not present

## 2018-09-25 ENCOUNTER — Ambulatory Visit (HOSPITAL_BASED_OUTPATIENT_CLINIC_OR_DEPARTMENT_OTHER): Payer: Medicare HMO | Admitting: Physical Medicine & Rehabilitation

## 2018-09-25 ENCOUNTER — Other Ambulatory Visit: Payer: Self-pay

## 2018-09-25 ENCOUNTER — Encounter: Payer: Self-pay | Admitting: Physical Medicine & Rehabilitation

## 2018-09-25 ENCOUNTER — Encounter: Payer: Medicare HMO | Attending: Physical Medicine & Rehabilitation

## 2018-09-25 VITALS — BP 92/54 | HR 66 | Ht 71.0 in | Wt 263.0 lb

## 2018-09-25 DIAGNOSIS — E782 Mixed hyperlipidemia: Secondary | ICD-10-CM | POA: Diagnosis not present

## 2018-09-25 DIAGNOSIS — I69354 Hemiplegia and hemiparesis following cerebral infarction affecting left non-dominant side: Secondary | ICD-10-CM | POA: Diagnosis not present

## 2018-09-25 DIAGNOSIS — F419 Anxiety disorder, unspecified: Secondary | ICD-10-CM | POA: Diagnosis not present

## 2018-09-25 DIAGNOSIS — I509 Heart failure, unspecified: Secondary | ICD-10-CM | POA: Diagnosis not present

## 2018-09-25 DIAGNOSIS — J449 Chronic obstructive pulmonary disease, unspecified: Secondary | ICD-10-CM | POA: Diagnosis not present

## 2018-09-25 DIAGNOSIS — I2510 Atherosclerotic heart disease of native coronary artery without angina pectoris: Secondary | ICD-10-CM | POA: Diagnosis not present

## 2018-09-25 DIAGNOSIS — G4733 Obstructive sleep apnea (adult) (pediatric): Secondary | ICD-10-CM | POA: Diagnosis not present

## 2018-09-25 DIAGNOSIS — G8110 Spastic hemiplegia affecting unspecified side: Secondary | ICD-10-CM

## 2018-09-25 DIAGNOSIS — E119 Type 2 diabetes mellitus without complications: Secondary | ICD-10-CM | POA: Diagnosis not present

## 2018-09-25 DIAGNOSIS — I252 Old myocardial infarction: Secondary | ICD-10-CM | POA: Diagnosis not present

## 2018-09-25 DIAGNOSIS — I110 Hypertensive heart disease with heart failure: Secondary | ICD-10-CM | POA: Insufficient documentation

## 2018-09-25 DIAGNOSIS — M1990 Unspecified osteoarthritis, unspecified site: Secondary | ICD-10-CM | POA: Insufficient documentation

## 2018-09-25 DIAGNOSIS — F17210 Nicotine dependence, cigarettes, uncomplicated: Secondary | ICD-10-CM | POA: Diagnosis not present

## 2018-09-25 NOTE — Progress Notes (Signed)
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 This includes bleeding, bruising, infection, excessive weakness, or medication side effects. A REMS form is on file and signed. Needle:  needle electrode Number of units per muscle LUE FDS 100 FDP 100 FPL 100 Biceps 100 Brachiorad 100  LLE Medial Hamstrings 300  Med gastroc 100 Lat Gastro 100 Med soleus 100 Lat soleus 100 Tibialis post 300 All injections were done after obtaining appropriate EMG activity and after negative drawback for blood. The patient tolerated the procedure well. Post procedure instructions were given. A followup appointment was made.

## 2018-09-25 NOTE — Patient Instructions (Signed)

## 2018-09-29 DIAGNOSIS — I69952 Hemiplegia and hemiparesis following unspecified cerebrovascular disease affecting left dominant side: Secondary | ICD-10-CM | POA: Diagnosis not present

## 2018-09-29 DIAGNOSIS — R279 Unspecified lack of coordination: Secondary | ICD-10-CM | POA: Diagnosis not present

## 2018-09-29 DIAGNOSIS — R269 Unspecified abnormalities of gait and mobility: Secondary | ICD-10-CM | POA: Diagnosis not present

## 2018-09-29 DIAGNOSIS — R262 Difficulty in walking, not elsewhere classified: Secondary | ICD-10-CM | POA: Diagnosis not present

## 2018-09-29 DIAGNOSIS — M6281 Muscle weakness (generalized): Secondary | ICD-10-CM | POA: Diagnosis not present

## 2018-10-01 DIAGNOSIS — R269 Unspecified abnormalities of gait and mobility: Secondary | ICD-10-CM | POA: Diagnosis not present

## 2018-10-01 DIAGNOSIS — R279 Unspecified lack of coordination: Secondary | ICD-10-CM | POA: Diagnosis not present

## 2018-10-01 DIAGNOSIS — M6281 Muscle weakness (generalized): Secondary | ICD-10-CM | POA: Diagnosis not present

## 2018-10-01 DIAGNOSIS — R262 Difficulty in walking, not elsewhere classified: Secondary | ICD-10-CM | POA: Diagnosis not present

## 2018-10-01 DIAGNOSIS — I69952 Hemiplegia and hemiparesis following unspecified cerebrovascular disease affecting left dominant side: Secondary | ICD-10-CM | POA: Diagnosis not present

## 2018-10-02 ENCOUNTER — Other Ambulatory Visit: Payer: Self-pay
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
## 2018-10-05 DIAGNOSIS — R279 Unspecified lack of coordination: Secondary | ICD-10-CM | POA: Diagnosis not present

## 2018-10-05 DIAGNOSIS — M6281 Muscle weakness (generalized): Secondary | ICD-10-CM | POA: Diagnosis not present

## 2018-10-05 DIAGNOSIS — R262 Difficulty in walking, not elsewhere classified: Secondary | ICD-10-CM | POA: Diagnosis not present

## 2018-10-05 DIAGNOSIS — R269 Unspecified abnormalities of gait and mobility: Secondary | ICD-10-CM | POA: Diagnosis not present

## 2018-10-05 DIAGNOSIS — I69952 Hemiplegia and hemiparesis following unspecified cerebrovascular disease affecting left dominant side: Secondary | ICD-10-CM | POA: Diagnosis not present

## 2018-10-06 ENCOUNTER — Other Ambulatory Visit: Payer: Self-pay

## 2018-10-06 ENCOUNTER — Ambulatory Visit
Admission: RE | Admit: 2018-10-06 | Discharge: 2018-10-06 | Disposition: A | Discharge status: Home / Self Care | Payer: Medicare HMO | Source: Ambulatory Visit | Attending: Nurse Practitioner | Admitting: Nurse Practitioner

## 2018-10-06 DIAGNOSIS — M545 Low back pain, unspecified: Secondary | ICD-10-CM

## 2018-10-07 DIAGNOSIS — I69952 Hemiplegia and hemiparesis following unspecified cerebrovascular disease affecting left dominant side: Secondary | ICD-10-CM | POA: Diagnosis not present

## 2018-10-07 DIAGNOSIS — R262 Difficulty in walking, not elsewhere classified: Secondary | ICD-10-CM | POA: Diagnosis not present

## 2018-10-07 DIAGNOSIS — M6281 Muscle weakness (generalized): Secondary | ICD-10-CM | POA: Diagnosis not present

## 2018-10-07 DIAGNOSIS — R269 Unspecified abnormalities of gait and mobility: Secondary | ICD-10-CM | POA: Diagnosis not present

## 2018-10-07 DIAGNOSIS — R279 Unspecified lack of coordination: Secondary | ICD-10-CM | POA: Diagnosis not present

## 2018-10-08 ENCOUNTER — Encounter (HOSPITAL_COMMUNITY): Payer: Self-pay | Admitting: Psychiatry

## 2018-10-08 ENCOUNTER — Ambulatory Visit (INDEPENDENT_AMBULATORY_CARE_PROVIDER_SITE_OTHER): Payer: Medicare HMO | Admitting: Psychiatry

## 2018-10-08 ENCOUNTER — Other Ambulatory Visit: Payer: Self-pay

## 2018-10-08 VITALS — BP 98/64 | HR 71 | Temp 98.4°F | Ht 71.0 in

## 2018-10-08 DIAGNOSIS — Z79899 Other long term (current) drug therapy: Secondary | ICD-10-CM

## 2018-10-08 DIAGNOSIS — F331 Major depressive disorder, recurrent, moderate: Secondary | ICD-10-CM

## 2018-10-08 DIAGNOSIS — Z638 Other specified problems related to primary support group: Secondary | ICD-10-CM

## 2018-10-08 DIAGNOSIS — Z560 Unemployment, unspecified: Secondary | ICD-10-CM

## 2018-10-08 MED ORDER — DULOXETINE HCL 30 MG PO CPEP: ORAL_CAPSULE | ORAL | 0 refills | Status: DC

## 2018-10-08 MED ORDER — DULOXETINE HCL 60 MG PO CPEP: ORAL_CAPSULE | ORAL | 0 refills | Status: DC

## 2018-10-08 MED ORDER — ALPRAZOLAM 1 MG PO TABS: 1.0000 mg | ORAL_TABLET | Freq: Every day | ORAL | 2 refills | Status: DC | PRN

## 2018-10-08 NOTE — Patient Instructions (Signed)
1. Increase duloxetine 90 mg (60 mg + 30 mg) daily  2. Decrease Paxil 10 mg daily for one week, then discontinue  3. Continue Xanax 1 mg daily as needed for anxiety  4. Return to clinic in three months 15 mins

## 2018-10-09 DIAGNOSIS — I2510 Atherosclerotic heart disease of native coronary artery without angina pectoris: Secondary | ICD-10-CM | POA: Diagnosis not present

## 2018-10-09 DIAGNOSIS — I69398 Other sequelae of cerebral infarction: Secondary | ICD-10-CM | POA: Diagnosis not present

## 2018-10-09 DIAGNOSIS — J449 Chronic obstructive pulmonary disease, unspecified: Secondary | ICD-10-CM | POA: Diagnosis not present

## 2018-10-15 ENCOUNTER — Encounter (HOSPITAL_COMMUNITY): Payer: Self-pay | Admitting: Licensed Clinical Social Worker

## 2018-10-15 ENCOUNTER — Other Ambulatory Visit: Payer: Self-pay

## 2018-10-15 ENCOUNTER — Ambulatory Visit (INDEPENDENT_AMBULATORY_CARE_PROVIDER_SITE_OTHER): Payer: Medicare HMO | Admitting: Licensed Clinical Social Worker

## 2018-10-15 DIAGNOSIS — Z9189 Other specified personal risk factors, not elsewhere classified: Secondary | ICD-10-CM | POA: Diagnosis not present

## 2018-10-15 DIAGNOSIS — Z62810 Personal history of physical and sexual abuse in childhood: Secondary | ICD-10-CM

## 2018-10-15 DIAGNOSIS — F331 Major depressive disorder, recurrent, moderate: Secondary | ICD-10-CM | POA: Diagnosis not present

## 2018-10-15 DIAGNOSIS — Z79899 Other long term (current) drug therapy: Secondary | ICD-10-CM | POA: Diagnosis not present

## 2018-10-15 DIAGNOSIS — M545 Low back pain: Secondary | ICD-10-CM | POA: Diagnosis not present

## 2018-10-15 DIAGNOSIS — G894 Chronic pain syndrome: Secondary | ICD-10-CM | POA: Diagnosis not present
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
## 2018-10-21 DIAGNOSIS — E1165 Type 2 diabetes mellitus with hyperglycemia: Secondary | ICD-10-CM | POA: Diagnosis not present

## 2018-10-21 DIAGNOSIS — I872 Venous insufficiency (chronic) (peripheral): Secondary | ICD-10-CM | POA: Diagnosis not present

## 2018-10-21 DIAGNOSIS — K210 Gastro-esophageal reflux disease with esophagitis: Secondary | ICD-10-CM | POA: Diagnosis not present

## 2018-10-21 DIAGNOSIS — E8881 Metabolic syndrome: Secondary | ICD-10-CM | POA: Diagnosis not present

## 2018-10-21 DIAGNOSIS — E782 Mixed hyperlipidemia: Secondary | ICD-10-CM | POA: Diagnosis not present

## 2018-10-21 DIAGNOSIS — M25512 Pain in left shoulder: Secondary | ICD-10-CM | POA: Diagnosis not present

## 2018-10-21 DIAGNOSIS — L03116 Cellulitis of left lower limb: Secondary | ICD-10-CM | POA: Diagnosis not present

## 2018-10-21 DIAGNOSIS — N184 Chronic kidney disease, stage 4 (severe): Secondary | ICD-10-CM | POA: Diagnosis not present

## 2018-10-21 DIAGNOSIS — I10 Essential (primary) hypertension: Secondary | ICD-10-CM | POA: Diagnosis not present

## 2018-10-28 DIAGNOSIS — F17210 Nicotine dependence, cigarettes, uncomplicated: Secondary | ICD-10-CM | POA: Diagnosis not present

## 2018-10-28 DIAGNOSIS — G8102 Flaccid hemiplegia affecting left dominant side: Secondary | ICD-10-CM | POA: Diagnosis not present

## 2018-10-28 DIAGNOSIS — E119 Type 2 diabetes mellitus without complications: Secondary | ICD-10-CM | POA: Diagnosis not present

## 2018-10-28 DIAGNOSIS — E782 Mixed hyperlipidemia: Secondary | ICD-10-CM | POA: Diagnosis not present

## 2018-10-28 DIAGNOSIS — I2510 Atherosclerotic heart disease of native coronary artery without angina pectoris: Secondary | ICD-10-CM | POA: Diagnosis not present

## 2018-10-28 DIAGNOSIS — I10 Essential (primary) hypertension: Secondary | ICD-10-CM | POA: Diagnosis not present

## 2018-10-28 DIAGNOSIS — J449 Chronic obstructive pulmonary disease, unspecified: Secondary | ICD-10-CM | POA: Diagnosis not present

## 2018-10-28 DIAGNOSIS — E8881 Metabolic syndrome: Secondary | ICD-10-CM | POA: Diagnosis not present

## 2018-11-06 ENCOUNTER — Ambulatory Visit (HOSPITAL_BASED_OUTPATIENT_CLINIC_OR_DEPARTMENT_OTHER): Payer: Medicare HMO | Admitting: Physical Medicine & Rehabilitation

## 2018-11-06 ENCOUNTER — Encounter: Payer: Self-pay | Admitting: Physical Medicine & Rehabilitation

## 2018-11-06 ENCOUNTER — Encounter: Payer: Medicare HMO | Attending: Physical Medicine & Rehabilitation

## 2018-11-06 ENCOUNTER — Other Ambulatory Visit: Payer: Self-pay

## 2018-11-06 DIAGNOSIS — I2510 Atherosclerotic heart disease of native coronary artery without angina pectoris: Secondary | ICD-10-CM | POA: Insufficient documentation

## 2018-11-06 DIAGNOSIS — M25512 Pain in left shoulder: Secondary | ICD-10-CM

## 2018-11-06 DIAGNOSIS — I509 Heart failure, unspecified: Secondary | ICD-10-CM | POA: Insufficient documentation

## 2018-11-06 DIAGNOSIS — E119 Type 2 diabetes mellitus without complications: Secondary | ICD-10-CM | POA: Insufficient documentation

## 2018-11-06 DIAGNOSIS — I110 Hypertensive heart disease with heart failure: Secondary | ICD-10-CM | POA: Insufficient documentation

## 2018-11-06 DIAGNOSIS — G4733 Obstructive sleep apnea (adult) (pediatric): Secondary | ICD-10-CM | POA: Insufficient documentation

## 2018-11-06 DIAGNOSIS — E782 Mixed hyperlipidemia: Secondary | ICD-10-CM | POA: Insufficient documentation

## 2018-11-06 DIAGNOSIS — I252 Old myocardial infarction: Secondary | ICD-10-CM | POA: Insufficient documentation

## 2018-11-06 DIAGNOSIS — J449 Chronic obstructive pulmonary disease, unspecified: Secondary | ICD-10-CM | POA: Insufficient documentation

## 2018-11-06 DIAGNOSIS — M1990 Unspecified osteoarthritis, unspecified site: Secondary | ICD-10-CM | POA: Insufficient documentation

## 2018-11-06 DIAGNOSIS — F419 Anxiety disorder, unspecified: Secondary | ICD-10-CM | POA: Insufficient documentation

## 2018-11-06 DIAGNOSIS — G8110 Spastic hemiplegia affecting unspecified side: Secondary | ICD-10-CM | POA: Diagnosis not present

## 2018-11-06 DIAGNOSIS — F17210 Nicotine dependence, cigarettes, uncomplicated: Secondary | ICD-10-CM | POA: Insufficient documentation

## 2018-11-06 DIAGNOSIS — I69354 Hemiplegia and hemiparesis following cerebral infarction affecting left non-dominant side: Secondary | ICD-10-CM | POA: Insufficient documentation

## 2018-11-06 NOTE — Progress Notes (Signed)
Subjective:    Patient ID: [name], male    DOB: Dec 15, 1965, 53 y.o.   MRN: [id number] Pt consents to televisit HPI CC Left shoulder pain Started 3 weeks ago  Last visit with me 09/25/2018 Dysport injection  LUE FDS 100 FDP 100 FPL 100 Biceps 100 Brachiorad 100  LLE Medial Hamstrings 300  Med gastroc 100 Lat Gastro 100 Med soleus 100 Lat soleus 100 Tibialis post 300  Allergic to NSAIDS Taking Tylenol, using heat and ice with only partial relief Denies recent falls or trauma No hx of shoulder surgery  Went to Urgent care and no xray performed.  Reviewed note, impression was reduced shoulder mobility and corticosteroid taper was ordered VIRTUAL VISIT- patient is at home Pain Inventory Average Pain 6 Pain Right Now 6 My pain is constant, sharp and aching  In the last 24 hours, has pain interfered with the following? General activity 6 Relation with others 6 Enjoyment of life 6 What TIME of day is your pain at its worst? morning Sleep (in general) Poor  Pain is worse with: walking and some activites Pain improves with: nothing Relief from Meds: 0  Mobility walk with assistance use a walker ability to climb steps?  yes do you drive?  no  Function disabled: date disabled . I need assistance with the following:  dressing, bathing, meal prep, household duties and shopping  Neuro/Psych tingling trouble walking spasms  Prior Studies Any changes since last visit?  no  Physicians involved in your care Any changes since last visit?  no   Family History  Problem Relation Age of Onset  . Lung cancer Father   . Heart disease Father   . Heart disease Mother   . Congestive Heart Failure Mother   . Diabetes Mother   . Hyperlipidemia Mother   . Alcohol abuse Brother   . Breast cancer Maternal Aunt   . Suicidality Cousin    Social History   Socioeconomic History  . Marital status: Legally Separated    Spouse name: Jolayne Haines  . Number of  children: 2  . Years of education: Not on file  . Highest education level: 12th grade  Occupational History  . Occupation: disabled  Social Needs  . Financial resource strain: Not on file  . Food insecurity:    Worry: Not on file    Inability: Not on file  . Transportation needs:    Medical: Not on file    Non-medical: Not on file  Tobacco Use  . Smoking status: Current Every Day Smoker    Packs/day: 1.50    Years: 30.00    Pack years: 45.00    Types: Cigarettes    Start date: 06/08/1982  . Smokeless tobacco: Never Used  . Tobacco comment: 1 pack per day 03/04/2018  Substance and Sexual Activity  . Alcohol use: No  . Drug use: No  . Sexual activity: Yes    Birth control/protection: None  Lifestyle  . Physical activity:    Days per week: Not on file    Minutes per session: Not on file  . Stress: Not on file  Relationships  . Social connections:    Talks on phone: Not on file    Gets together: Not on file    Attends religious service: Not on file    Active member of club or organization: Not on file    Attends meetings of clubs or organizations: Not on file    Relationship status: Not on file  Other Topics Concern  . Not on file  Social History Narrative   Disabled since age 45 lives with wife and children      Patient is right-handed. He is married, lives in a 1 story house, has a ramp to enter. Drinks 64oz of Mtn. Dew a day. Prior to CVA was walking daily. 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   Past Surgical History:  Procedure Laterality Date  . CHOLECYSTECTOMY    . CORONARY ARTERY BYPASS GRAFT  2010   LIMA to LAD, SVG to diagonal, SVG to OM1 and OM 2, SVG to RCA  . DENTAL SURGERY  2003  . GASTRIC BYPASS  2010  . HERNIA REPAIR  2011, 2012  . INCISIONAL HERNIA REPAIR N/A 07/23/2013   Procedure: HERNIA REPAIR INCISIONAL WITH MESH;  Surgeon: Jamesetta So, MD;  Location: [facility];  Service: General;  Laterality: N/A;  . INSERTION OF MESH N/A  07/23/2013   Procedure: INSERTION OF MESH;  Surgeon: Jamesetta So, MD;  Location: [facility];  Service: General;  Laterality: N/A;  . LEFT HEART CATHETERIZATION WITH CORONARY/GRAFT ANGIOGRAM N/A 11/01/2013   Procedure: LEFT HEART CATHETERIZATION WITH Beatrix Fetters;  Surgeon: Blane Ohara, MD;  Location: [facility] CATH LAB;  Service: Cardiovascular;  Laterality: N/A;  . TOE AMPUTATION  1998   right 1st and 2nd toe  . TONSILECTOMY, ADENOIDECTOMY, BILATERAL MYRINGOTOMY AND TUBES    . TONSILLECTOMY    . VENTRAL HERNIA REPAIR N/A 10/28/2012   Procedure: LAPAROSCOPIC VENTRAL HERNIA;  Surgeon: Donato Heinz, MD;  Location: [facility];  Service: General;  Laterality: N/A;   Past Medical History:  Diagnosis Date  . Anaphylaxis    IGE mediated  . Anxiety   . Arthritis   . Cardiomyopathy ([place])   . CHF (congestive heart failure) ([place])   . COPD (chronic obstructive pulmonary disease) ([place])   . Coronary atherosclerosis of native coronary artery    Multivessel status post CABG 2010  . Depression   . Essential hypertension, benign   . History of CVA (cerebrovascular accident) 01/2012   Right posterior frontal cortical and subcortical brain by MRI, no hemorrhage. Carotid Dopplers showed only 1-50% bilateral ICA stenoses. Echocardiogram showed LVEF 50%, no major valvular abnormalities.  . Mixed hyperlipidemia   . Myocardial infarction ([place]) 2010  . OSA (obstructive sleep apnea)   . Stroke ([place])   . Type 2 diabetes mellitus ([facility])    There were no vitals taken for this visit.  Opioid Risk Score:   Fall Risk Score:  `1  Depression screen PHQ 2/9  Depression screen [facility] 2/9 11/06/2018 08/14/2018 05/19/2018 09/29/2017  Decreased Interest 0 1 1 1   Down, Depressed, Hopeless 0 1 1 3   PHQ - 2 Score 0 2 2 4   Altered sleeping - - - 3  Tired, decreased energy - - - 2  Change in appetite - - - 3  Feeling bad or failure about yourself  - - - 3  Trouble concentrating - - - 3  Moving slowly or  fidgety/restless - - - 2  Suicidal thoughts - - - 0  PHQ-9 Score - - - 20  Difficult doing work/chores - - - Somewhat difficult   Review of Systems  Constitutional: Negative.   HENT: Negative.   Eyes: Negative.   Respiratory: Negative.   Cardiovascular: Negative.   Gastrointestinal: Negative.   Endocrine: Negative.   Genitourinary: Negative.   Musculoskeletal: Positive for gait problem.  Left arm pain/ spasms  Skin: Negative.   Allergic/Immunologic: Negative.   Neurological:       Tingling  Hematological: Bruises/bleeds easily.       Plavix  Psychiatric/Behavioral: Negative.   All other systems reviewed and are negative.      Objective:   Physical Exam Nursing note reviewed.  Neurological:     Mental Status: He is alert. Mental status is at baseline.  Psychiatric:        Mood and Affect: Mood normal.   Dysarthria mild        Assessment & Plan:  1. Left post stroke shoulder pain, associated with weakness.  THis is multifactorial and includes abnormal biomechanics with impingement, probable neuropathic pain and contracture (frozen shoulder /adhesive capsulitis) Pt already on Cymbalta prescribed by PCP He was given a prednisone dose pack by Urgent care  Description of shoulder issue sounds like either impingement, adhesive capsulitis or possibly trapezius overuse.  Will schedule for clinic visit next month may need shoulder injection vs trapezius trigger point injection  2.  Post stroke spasticity may be contributing to #1 Good results from Dysport injection will repeat ~12/26/2018  Visit duration 36min

## 2018-11-09 DIAGNOSIS — J449 Chronic obstructive pulmonary disease, unspecified: Secondary | ICD-10-CM | POA: Diagnosis not present

## 2018-11-09 DIAGNOSIS — I69398 Other sequelae of cerebral infarction: Secondary | ICD-10-CM | POA: Diagnosis not present

## 2018-11-09 DIAGNOSIS — I2510 Atherosclerotic heart disease of native coronary artery without angina pectoris: Secondary | ICD-10-CM | POA: Diagnosis not present

## 2018-11-17 ENCOUNTER — Other Ambulatory Visit: Payer: Self-pay | Admitting: Cardiology

## 2018-11-18 ENCOUNTER — Encounter (HOSPITAL_COMMUNITY): Payer: Self-pay | Admitting: Licensed Clinical Social Worker

## 2018-11-18 ENCOUNTER — Other Ambulatory Visit: Payer: Self-pay

## 2018-11-18 ENCOUNTER — Ambulatory Visit (INDEPENDENT_AMBULATORY_CARE_PROVIDER_SITE_OTHER): Payer: Medicare HMO | Admitting: Licensed Clinical Social Worker

## 2018-11-18 DIAGNOSIS — F331 Major depressive disorder, recurrent, moderate: Secondary | ICD-10-CM

## 2018-11-18 DIAGNOSIS — Z62810 Personal history of physical and sexual abuse in childhood: Secondary | ICD-10-CM
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
## 2018-12-09 ENCOUNTER — Other Ambulatory Visit: Payer: Self-pay

## 2018-12-09 ENCOUNTER — Ambulatory Visit (HOSPITAL_COMMUNITY): Payer: 59 | Admitting: Licensed Clinical Social Worker

## 2018-12-11 ENCOUNTER — Other Ambulatory Visit: Payer: Self-pay

## 2018-12-11 ENCOUNTER — Encounter: Payer: Medicare HMO | Attending: Physical Medicine & Rehabilitation | Admitting: Physical Medicine & Rehabilitation

## 2018-12-11 VITALS — BP 94/53 | HR 46 | Temp 98.0°F

## 2018-12-11 DIAGNOSIS — I252 Old myocardial infarction: Secondary | ICD-10-CM | POA: Insufficient documentation

## 2018-12-11 DIAGNOSIS — F17210 Nicotine dependence, cigarettes, uncomplicated: Secondary | ICD-10-CM | POA: Diagnosis not present

## 2018-12-11 DIAGNOSIS — I2510 Atherosclerotic heart disease of native coronary artery without angina pectoris: Secondary | ICD-10-CM | POA: Diagnosis not present

## 2018-12-11 DIAGNOSIS — F419 Anxiety disorder, unspecified: Secondary | ICD-10-CM | POA: Insufficient documentation

## 2018-12-11 DIAGNOSIS — M7502 Adhesive capsulitis of left shoulder: Secondary | ICD-10-CM | POA: Insufficient documentation

## 2018-12-11 DIAGNOSIS — E119 Type 2 diabetes mellitus without complications: Secondary | ICD-10-CM | POA: Insufficient documentation

## 2018-12-11 DIAGNOSIS — E782 Mixed hyperlipidemia: Secondary | ICD-10-CM | POA: Diagnosis not present

## 2018-12-11 DIAGNOSIS — J449 Chronic obstructive pulmonary disease, unspecified: Secondary | ICD-10-CM | POA: Diagnosis not present

## 2018-12-11 DIAGNOSIS — I509 Heart failure, unspecified: Secondary | ICD-10-CM | POA: Insufficient documentation

## 2018-12-11 DIAGNOSIS — I110 Hypertensive heart disease with heart failure: Secondary | ICD-10-CM | POA: Diagnosis not present

## 2018-12-11 DIAGNOSIS — G4733 Obstructive sleep apnea (adult) (pediatric): Secondary | ICD-10-CM | POA: Diagnosis not present

## 2018-12-11 DIAGNOSIS — G8110 Spastic hemiplegia affecting unspecified side: Secondary | ICD-10-CM | POA: Diagnosis present

## 2018-12-11 DIAGNOSIS — M1990 Unspecified osteoarthritis, unspecified site: Secondary | ICD-10-CM | POA: Insufficient documentation

## 2018-12-11 DIAGNOSIS — I69354 Hemiplegia and hemiparesis following cerebral infarction affecting left non-dominant side: Secondary | ICD-10-CM | POA: Insufficient documentation

## 2018-12-11 NOTE — Progress Notes (Signed)
Shoulder injection Left Glenohumeral    Indication:Left Shoulder pain not relieved by medication management and other conservative care.  Informed consent was obtained after describing risks and benefits of the procedure with the patient, this includes bleeding, bruising, infection and medication side effects. The patient wishes to proceed and has given written consent. Patient was placed in a seated position. The Left shoulder was marked and prepped with betadine in the subacromial area. A 25-gauge 1-1/2 inch needle was inserted into the subacromial area. After negative draw back for blood, a solution containing 1 mL of 6 mg per ML betamethasone and 4 mL of 1% lidocaine was injected. A band aid was applied. The patient tolerated the procedure well. Post procedure instructions were given.

## 2018-12-11 NOTE — Patient Instructions (Signed)
Cortisone and lidocaine injected for a frozen Left shoulder

## 2018-12-14 ENCOUNTER — Encounter (HOSPITAL_COMMUNITY): Payer: Self-pay | Admitting: Licensed Clinical Social Worker

## 2018-12-14 ENCOUNTER — Other Ambulatory Visit: Payer: Self-pay

## 2018-12-14 ENCOUNTER — Ambulatory Visit (INDEPENDENT_AMBULATORY_CARE_PROVIDER_SITE_OTHER): Payer: Medicare HMO | Admitting: Licensed Clinical Social Worker

## 2018-12-14 DIAGNOSIS — F331 Major depressive disorder, recurrent, moderate: Secondary | ICD-10-CM
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
## 2018-12-18 ENCOUNTER — Other Ambulatory Visit: Payer: Self-pay | Admitting: Cardiology

## 2018-12-22 ENCOUNTER — Telehealth: Payer: Self-pay | Admitting: Cardiology

## 2018-12-22 NOTE — Telephone Encounter (Signed)
Virtual Visit Pre-Appointment Phone Call  "(Name), I am calling you today to discuss your upcoming appointment. We are currently trying to limit exposure to the virus that causes COVID-19 by seeing patients at home rather than in the office."  1. "What is the BEST phone number to call the day of the visit?" - include this in appointment notes  2. Do you have or have access to (through a family member/friend) a smartphone with video capability that we can use for your visit?" a. If yes - list this number in appt notes as cell (if different from BEST phone #) and list the appointment type as a VIDEO visit in appointment notes b. If no - list the appointment type as a PHONE visit in appointment notes  3. Confirm consent - "In the setting of the current Covid19 crisis, you are scheduled for a (phone or video) visit with your provider on (date) at (time).  Just as we do with many in-office visits, in order for you to participate in this visit, we must obtain consent.  If you'd like, I can send this to your mychart (if signed up) or email for you to review.  Otherwise, I can obtain your verbal consent now.  All virtual visits are billed to your insurance company just like a normal visit would be.  By agreeing to a virtual visit, we'd like you to understand that the technology does not allow for your provider to perform an examination, and thus may limit your provider's ability to fully assess your condition. If your provider identifies any concerns that need to be evaluated in person, we will make arrangements to do so.  Finally, though the technology is pretty good, we cannot assure that it will always work on either your or our end, and in the setting of a video visit, we may have to convert it to a phone-only visit.  In either situation, we cannot ensure that we have a secure connection.  Are you willing to proceed?" STAFF: Did the patient verbally acknowledge consent to telehealth visit? Document  YES/NO here: yes  4. Advise patient to be prepared - "Two hours prior to your appointment, go ahead and check your blood pressure, pulse, oxygen saturation, and your weight (if you have the equipment to check those) and write them all down. When your visit starts, your provider will ask you for this information. If you have an Apple Watch or Kardia device, please plan to have heart rate information ready on the day of your appointment. Please have a pen and paper handy nearby the day of the visit as well."  5. Give patient instructions for MyChart download to smartphone OR [email or web address] as below if video visit (depending on what platform provider is using)  6. Inform patient they will receive a phone call 15 minutes prior to their appointment time (may be from unknown caller ID) so they should be prepared to answer    TELEPHONE CALL NOTE  SAMAD THON has been deemed a candidate for a follow-up tele-health visit to limit community exposure during the Covid-19 pandemic. I spoke with the patient via phone to ensure availability of phone/video source, confirm preferred email & phone number, and discuss instructions and expectations.  I reminded ANTHEM FRAZER to be prepared with any vital sign and/or heart rhythm information that could potentially be obtained via home monitoring, at the time of his visit. I reminded AMEDIO BOWLBY to expect a phone call prior to  his visit.  Weston Anna 12/22/2018 2:28 PM   INSTRUCTIONS FOR DOWNLOADING THE MYCHART APP TO SMARTPHONE  - The patient must first make sure to have activated MyChart and know their login information - If Apple, go to [employer] and type in MyChart in the search bar and download the app. If Android, ask patient to go to [employer] and type in [place] in the search bar and download the app. The app is free but as with any other app downloads, their phone may require them to verify saved payment information or  Apple/Android password.  - The patient will need to then log into the app with their MyChart username and password, and select [facility] as their healthcare provider to link the account. When it is time for your visit, go to the MyChart app, find appointments, and click Begin Video Visit. Be sure to Select Allow for your device to access the Microphone and Camera for your visit. You will then be connected, and your provider will be with you shortly.  **If they have any issues connecting, or need assistance please contact MyChart service desk [phone])**  **If using a computer, in order to ensure the best quality for their visit they will need to use either of the following Internet Browsers: [employer], or Google Chrome**  IF USING DOXIMITY or DOXY.ME - The patient will receive a link just prior to their visit by text.     FULL LENGTH CONSENT FOR TELE-HEALTH VISIT   I hereby voluntarily request, consent and authorize [facility] and its employed or contracted physicians, physician assistants, nurse practitioners or other licensed health care professionals (the Practitioner), to provide me with telemedicine health care services (the Services") as deemed necessary by the treating Practitioner. I acknowledge and consent to receive the Services by the Practitioner via telemedicine. I understand that the telemedicine visit will involve communicating with the Practitioner through live audiovisual communication technology and the disclosure of certain medical information by electronic transmission. I acknowledge that I have been given the opportunity to request an in-person assessment or other available alternative prior to the telemedicine visit and am voluntarily participating in the telemedicine visit.  I understand that I have the right to withhold or withdraw my consent to the use of telemedicine in the course of my care at any time, without affecting my right to future care  or treatment, and that the Practitioner or I may terminate the telemedicine visit at any time. I understand that I have the right to inspect all information obtained and/or recorded in the course of the telemedicine visit and may receive copies of available information for a reasonable fee.  I understand that some of the potential risks of receiving the Services via telemedicine include:   Delay or interruption in medical evaluation due to technological equipment failure or disruption;  Information transmitted may not be sufficient (e.g. poor resolution of images) to allow for appropriate medical decision making by the Practitioner; and/or   In rare instances, security protocols could fail, causing a breach of personal health information.  Furthermore, I acknowledge that it is my responsibility to provide information about my medical history, conditions and care that is complete and accurate to the best of my ability. I acknowledge that Practitioner's advice, recommendations, and/or decision may be based on factors not within their control, such as incomplete or inaccurate data provided by me or distortions of diagnostic images or specimens that may result from electronic transmissions. I  understand that the practice of medicine is not an exact science and that Practitioner makes no warranties or guarantees regarding treatment outcomes. I acknowledge that I will receive a copy of this consent concurrently upon execution via email to the email address I last provided but may also request a printed copy by calling the office of [facility].    I understand that my insurance will be billed for this visit.   I have read or had this consent read to me.  I understand the contents of this consent, which adequately explains the benefits and risks of the Services being provided via telemedicine.   I have been provided ample opportunity to ask questions regarding this consent and the Services and have had  my questions answered to my satisfaction.  I give my informed consent for the services to be provided through the use of telemedicine in my medical care  By participating in this telemedicine visit I agree to the above.

## 2018-12-27 ENCOUNTER — Encounter: Payer: Self-pay | Admitting: Cardiology

## 2018-12-27 NOTE — Progress Notes (Signed)
Virtual Visit via Video Note   This visit type was conducted due to national recommendations for restrictions regarding the COVID-19 Pandemic (e.g. social distancing) in an effort to limit this patient's exposure and mitigate transmission in our community.  Due to his co-morbid illnesses, this patient is at least at moderate risk for complications without adequate follow up.  This format is felt to be most appropriate for this patient at this time.  All issues noted in this document were discussed and addressed.  A limited physical exam was performed with this format.  Please refer to the patient's chart for his consent to telehealth for [facility].   Date:  12/28/2018   ID:  [name], DOB 1965/08/20, MRN [id number]  Patient Location: Home Provider Location: Office  PCP:  Caryl Bis, MD  Cardiologist:  Rozann Lesches, MD Electrophysiologist:  None   Evaluation Performed:  Follow-Up Visit  Chief Complaint:   Cardiac follow-up  History of Present Illness:    [name] is a medically complex 53 y.o. male last seen in December 2019.  We communicated by video conferencing today.  He tells me that he has had no angina symptoms or increasing shortness of breath since last visit.  He is undergoing outpatient physical therapy at this time, also has a nurse coming in to check on him at home.  He is able to ambulate with a cane to limited degree but still has fairly significant left-sided weakness following prior stroke.  He uses a wheelchair.  I reviewed his medications.  Cardiac regimen includes aspirin, Lipitor, Coreg, Plavix, Entresto Lasix, and Imdur.  He had lab work done with Dr. Quillian Quince which we are requesting.  Follow-up echocardiogram in April 2019 revealed LVEF approximately 40%.  The patient does not have symptoms concerning for COVID-19 infection (fever, chills, cough, or new shortness of breath).    Past Medical History:  Diagnosis Date  .  Anaphylaxis    IGE mediated  . Anxiety   . Arthritis   . Cardiomyopathy ([place])   . CHF (congestive heart failure) ([place])   . COPD (chronic obstructive pulmonary disease) ([place])   . Coronary atherosclerosis of native coronary artery    Multivessel status post CABG 2010  . Depression   . Essential hypertension   . History of CVA (cerebrovascular accident) 01/2012   Right posterior frontal cortical and subcortical brain by MRI, no hemorrhage. Carotid Dopplers showed only 1-50% bilateral ICA stenoses. Echocardiogram showed LVEF 50%, no major valvular abnormalities.  . Mixed hyperlipidemia   . Myocardial infarction ([place]) 2010  . OSA (obstructive sleep apnea)   . Stroke ([place])   . Type 2 diabetes mellitus ([place])    Past Surgical History:  Procedure Laterality Date  . CHOLECYSTECTOMY    . CORONARY ARTERY BYPASS GRAFT  2010   LIMA to LAD, SVG to diagonal, SVG to OM1 and OM 2, SVG to RCA  . DENTAL SURGERY  2003  . GASTRIC BYPASS  2010  . HERNIA REPAIR  2011, 2012  . INCISIONAL HERNIA REPAIR N/A 07/23/2013   Procedure: HERNIA REPAIR INCISIONAL WITH MESH;  Surgeon: Jamesetta So, MD;  Location: [facility];  Service: General;  Laterality: N/A;  . INSERTION OF MESH N/A 07/23/2013   Procedure: INSERTION OF MESH;  Surgeon: Jamesetta So, MD;  Location: [facility];  Service: General;  Laterality: N/A;  . LEFT HEART CATHETERIZATION WITH CORONARY/GRAFT ANGIOGRAM N/A 11/01/2013   Procedure: LEFT HEART CATHETERIZATION WITH CORONARY/GRAFT ANGIOGRAM;  Surgeon: Blane Ohara, MD;  Location: [facility];  Service: Cardiovascular;  Laterality: N/A;  . TOE AMPUTATION  1998   right 1st and 2nd toe  . TONSILECTOMY, ADENOIDECTOMY, BILATERAL MYRINGOTOMY AND TUBES    . TONSILLECTOMY    . VENTRAL HERNIA REPAIR N/A 10/28/2012   Procedure: LAPAROSCOPIC VENTRAL HERNIA;  Surgeon: Donato Heinz, MD;  Location: [facility];  Service: General;  Laterality: N/A;     Current Meds  Medication Sig  . albuterol (PROVENTIL HFA;VENTOLIN  HFA) 108 (90 BASE) MCG/ACT inhaler Inhale 2 puffs into the lungs every 6 (six) hours as needed for wheezing or shortness of breath.  . ALPRAZolam (XANAX) 1 MG tablet Take 1 tablet (1 mg total) by mouth daily as needed for anxiety.  [name] aspirin EC 81 MG tablet Take 1 tablet (81 mg total) by mouth daily.  [name] atorvastatin (LIPITOR) 40 MG tablet Take 1 tablet by mouth daily.  . baclofen (LIORESAL) 10 MG tablet Take 1 tablet (10 mg total) by mouth 3 (three) times daily as needed for muscle spasms.  . carvedilol (COREG) 6.25 MG tablet Take 6.25 mg by mouth 2 (two) times daily with a meal.  . Cholecalciferol (VITAMIN D) 2000 units tablet Take 2,000 Units by mouth daily at 12 noon.  . clopidogrel (PLAVIX) 75 MG tablet Take 1 tablet by mouth daily.  . DULoxetine (CYMBALTA) 30 MG capsule Take 90 mg daily (60 mg + 30 mg)  . DULoxetine (CYMBALTA) 60 MG capsule Take 90 mg daily (60 mg + 30 mg)  . ENTRESTO 49-51 MG TAKE (1) TABLET TWICE DAILY.  . fenofibrate micronized (LOFIBRA) 134 MG capsule Take 134 mg by mouth daily before breakfast.   . fluticasone (CUTIVATE) 0.05 % cream Apply 1 application topically 2 (two) times daily.  . furosemide (LASIX) 20 MG tablet Take 20 mg by mouth.  . gabapentin (NEURONTIN) 600 MG tablet Take 1 tablet by mouth 3 (three) times daily.  . isosorbide mononitrate (IMDUR) 60 MG 24 hr tablet Take 60 mg by mouth 2 (two) times daily.   . nitroGLYCERIN (NITROSTAT) 0.4 MG SL tablet Place 0.4 mg under the tongue every 5 (five) minutes as needed. For chest pain  . oxyCODONE (OXY IR/ROXICODONE) 5 MG immediate release tablet Take 5 mg by mouth every 6 (six) hours as needed for severe pain.   . pantoprazole (PROTONIX) 40 MG tablet Take 40 mg by mouth daily.  [name] PARoxetine (PAXIL) 20 MG tablet Take 1 tablet by mouth daily.  . phentermine 37.5 MG capsule Take 1 capsule by mouth daily.  . vitamin B-12 (CYANOCOBALAMIN) 1000 MCG tablet Take 1,000 mcg by mouth daily at 12 noon.     Allergies:    Contrast media [iodinated diagnostic agents], Ibuprofen, and Nsaids   Social History   Tobacco Use  . Smoking status: Current Every Day Smoker    Packs/day: 1.50    Years: 30.00    Pack years: 45.00    Types: Cigarettes    Start date: 06/08/1982  . Smokeless tobacco: Never Used  . Tobacco comment: 1 pack per day 03/04/2018  Substance Use Topics  . Alcohol use: No  . Drug use: No     Family Hx: The patient's family history includes Alcohol abuse in his brother; Breast cancer in his maternal aunt; Congestive Heart Failure in his mother; Diabetes in his mother; Heart disease in his father and mother; Hyperlipidemia in his mother; Lung cancer in his father; Suicidality in his cousin.  ROS:   Please see the history of present illness. All other systems reviewed and are negative.   Prior CV studies:   The following studies were reviewed today:  Echocardiogram 10/30/2017: Study Conclusions  - Left ventricle: The cavity size was mildly dilated. Wall thickness was increased in a pattern of mild LVH. The estimated ejection fraction was approximately 40% based on limited images. Indeterminate diastolic function. There is akinesis of the anteroseptal myocardium. - Aortic valve: Mildly calcified annulus. Trileaflet. There was mild regurgitation. - Mitral valve: Mildly calcified annulus. There was trivial regurgitation. - Right ventricle: Poorly visualized. - Atrial septum: No defect or patent foramen ovale was identified. - Tricuspid valve: There was trivial regurgitation. - Pulmonary arteries: Systolic pressure could not be accurately estimated. - Pericardium, extracardiac: There was no pericardial effusion.  Lexiscan Myoview 06/18/2017([facility]): FINDINGS: Large fixed defect is present involving the apex and large portion of the periapical anterior wall. No evidence of ischemia. Wall motion analysis reveals apical dyskinesis and akinesis. Left ventricular ejection  fraction is calculated to be  18%.   Labs/Other Tests and Data Reviewed:    EKG:  An ECG dated 07/01/2018 was personally reviewed today and demonstrated:  Sinus rhythm with old anterior infarct pattern, nonspecific ST/T changes.  Recent Labs:  May 2019: BUN 22, creatinine 1.38, potassium 4.3, AST 20, ALT 18, cholesterol 153, triglycerides 194, HDL 27, LDL 87, hemoglobin A1c 5.7, TSH 2.25  Wt Readings from Last 3 Encounters:  12/28/18 263 lb (119.3 kg)  09/25/18 263 lb (119.3 kg)  09/11/18 263 lb (119.3 kg)     Objective:    Vital Signs:  BP 112/76   Pulse 74   Ht 5\' 11"  (1.803 m)   Wt 263 lb (119.3 kg)   BMI 36.68 kg/m    General: Obese male, seated in his home, no distress. HEENT: Conjunctiva and lids normal. Lungs: Patient spoke in full sentences, not short of breath at rest.  No audible wheezing or coughing. Skin: Normal appearance of color and turgor. Neuropsychiatric: Gaze conjugate.  Speech pattern normal.  Has known left-sided weakness from prior stroke.  Affect appropriate.  ASSESSMENT & PLAN:    1.  Ischemic cardiomyopathy with LVEF approximately 40% by last evaluation in April 2019.  He is tolerating current medical regimen well, weight is been stable.  Plan to request interval lab work from PCP.  We will follow-up echocardiogram prior to his next visit.  2.  Multivessel CAD status post CABG with patent bypass grafts in 2015 and more recent Myoview in December 2019 demonstrated large infarct scar but no ischemia.  He has been managed medically and reports no active angina at this time.  3.  History of stroke with left-sided hemiparesis.  He continues with outpatient rehabilitation.  4.  Mixed hyperlipidemia on Lipitor.  Requesting lab work from PCP.  5.  Essential hypertension, blood pressure is well controlled today.  COVID-19 Education: The signs and symptoms of COVID-19 were discussed with the patient and how to seek care for testing (follow up with PCP or  arrange E-visit).  The importance of social distancing was discussed today.  Time:   Today, I have spent 5 minutes with the patient with telehealth technology discussing the above problems.     Medication Adjustments/Labs and Tests Ordered: Current medicines are reviewed at length with the patient today.  Concerns regarding medicines are outlined above.   Tests Ordered: Orders Placed This Encounter  Procedures  . ECHOCARDIOGRAM COMPLETE    Medication  Changes: No orders of the defined types were placed in this encounter.   Follow Up:  In Person 6 months in the [place] office.  Signed, Rozann Lesches, MD  12/28/2018 9:27 AM    [place]

## 2018-12-28 ENCOUNTER — Telehealth: Payer: Self-pay | Admitting: Cardiology

## 2018-12-28 ENCOUNTER — Telehealth (INDEPENDENT_AMBULATORY_CARE_PROVIDER_SITE_OTHER): Payer: Medicare HMO | Admitting: Cardiology

## 2018-12-28 ENCOUNTER — Encounter: Payer: Self-pay | Admitting: *Deleted

## 2018-12-28 ENCOUNTER — Encounter: Payer: Self-pay | Admitting: Cardiology

## 2018-12-28 VITALS — BP 112/76 | HR 74 | Ht 71.0 in | Wt 263.0 lb

## 2018-12-28 DIAGNOSIS — I255 Ischemic cardiomyopathy: Secondary | ICD-10-CM

## 2018-12-28 DIAGNOSIS — Z8673 Personal history of transient ischemic attack (TIA), and cerebral infarction without residual deficits: Secondary | ICD-10-CM

## 2018-12-28 DIAGNOSIS — I25119 Atherosclerotic heart disease of native coronary artery with unspecified angina pectoris: Secondary | ICD-10-CM

## 2018-12-28 DIAGNOSIS — Z7189 Other specified counseling: Secondary | ICD-10-CM | POA: Diagnosis not present

## 2018-12-28 DIAGNOSIS — E782 Mixed hyperlipidemia: Secondary | ICD-10-CM

## 2018-12-28 DIAGNOSIS — I10 Essential (primary) hypertension: Secondary | ICD-10-CM

## 2018-12-28 NOTE — Patient Instructions (Addendum)
Medication Instructions:   Your physician recommends that you continue on your current medications as directed. Please refer to the Current Medication list given to you today.  Labwork:  NONE  Testing/Procedures: Your physician has requested that you have an echocardiogram just before your next visit. Echocardiography is a painless test that uses sound waves to create images of your heart. It provides your doctor with information about the size and shape of your heart and how well your heart's chambers and valves are working. This procedure takes approximately one hour. There are no restrictions for this procedure.  Follow-Up:  Your physician recommends that you schedule a follow-up appointment in: 6 months. You will receive a reminder letter in the mail in about 4 months reminding you to call and schedule your appointment. If you don't receive this letter, please contact our office.   Any Other Special Instructions Will Be Listed Below (If Applicable).  If you need a refill on your cardiac medications before your next appointment, please call your pharmacy.

## 2018-12-28 NOTE — Telephone Encounter (Signed)
°  Precert needed for: ECHO  Location:   [facility]  Date: Dec 16,2020

## 2018-12-29 ENCOUNTER — Ambulatory Visit (HOSPITAL_COMMUNITY): Payer: Medicaid Other | Admitting: Licensed Clinical Social Worker

## 2018-12-29 ENCOUNTER — Other Ambulatory Visit: Payer: Self-pay

## 2019-01-08 ENCOUNTER — Other Ambulatory Visit: Payer: Self-pay

## 2019-01-08 ENCOUNTER — Ambulatory Visit (HOSPITAL_COMMUNITY): Payer: Medicare HMO | Admitting: Psychiatry

## 2019-01-08 ENCOUNTER — Encounter: Payer: Medicare HMO | Attending: Physical Medicine & Rehabilitation | Admitting: Physical Medicine & Rehabilitation

## 2019-01-08 VITALS — BP 110/67 | HR 77 | Temp 95.9°F

## 2019-01-08 DIAGNOSIS — E119 Type 2 diabetes mellitus without complications: Secondary | ICD-10-CM | POA: Diagnosis not present

## 2019-01-08 DIAGNOSIS — J449 Chronic obstructive pulmonary disease, unspecified: Secondary | ICD-10-CM | POA: Diagnosis not present

## 2019-01-08 DIAGNOSIS — G4733 Obstructive sleep apnea (adult) (pediatric): Secondary | ICD-10-CM | POA: Diagnosis not present

## 2019-01-08 DIAGNOSIS — I110 Hypertensive heart disease with heart failure: Secondary | ICD-10-CM | POA: Diagnosis not present

## 2019-01-08 DIAGNOSIS — I252 Old myocardial infarction: Secondary | ICD-10-CM | POA: Insufficient documentation

## 2019-01-08 DIAGNOSIS — I2510 Atherosclerotic heart disease of native coronary artery without angina pectoris: Secondary | ICD-10-CM | POA: Insufficient documentation

## 2019-01-08 DIAGNOSIS — F17210 Nicotine dependence, cigarettes, uncomplicated: Secondary | ICD-10-CM | POA: Insufficient documentation

## 2019-01-08 DIAGNOSIS — F419 Anxiety disorder, unspecified: Secondary | ICD-10-CM | POA: Diagnosis not present

## 2019-01-08 DIAGNOSIS — G8110 Spastic hemiplegia affecting unspecified side: Secondary | ICD-10-CM

## 2019-01-08 DIAGNOSIS — I509 Heart failure, unspecified: Secondary | ICD-10-CM | POA: Diagnosis not present

## 2019-01-08 DIAGNOSIS — I69354 Hemiplegia and hemiparesis following cerebral infarction affecting left non-dominant side: Secondary | ICD-10-CM | POA: Insufficient documentation

## 2019-01-08 DIAGNOSIS — M1990 Unspecified osteoarthritis, unspecified site: Secondary | ICD-10-CM | POA: Diagnosis not present

## 2019-01-08 DIAGNOSIS — E782 Mixed hyperlipidemia: Secondary | ICD-10-CM | POA: Insufficient documentation

## 2019-01-08 NOTE — Patient Instructions (Signed)

## 2019-01-08 NOTE — Progress Notes (Signed)
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 This includes bleeding, bruising, infection, excessive weakness, or medication side effects. A REMS form is on file and signed. Needle:  25g 2"needle electrode Number of units per muscle LUE FDS 100 FDP 100 FPL 100 Biceps 100 Brachiorad 100  LLE Medial Hamstrings 400  Med gastroc 100 Lat Gastro 100  Lat soleus 100 Tibialis post 300 All injections were done after obtaining appropriate EMG activity and after negative drawback for blood. The patient tolerated the procedure well. Post procedure instructions were given. A followup appointment was made.

## 2019-01-11 ENCOUNTER — Other Ambulatory Visit: Payer: Self-pay

## 2019-01-11 ENCOUNTER — Ambulatory Visit (HOSPITAL_COMMUNITY): Payer: Medicare HMO | Admitting: Licensed Clinical Social Worker
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
## 2019-01-14 ENCOUNTER — Other Ambulatory Visit: Payer: Self-pay | Admitting: Cardiology

## 2019-01-14 ENCOUNTER — Other Ambulatory Visit: Payer: Self-pay

## 2019-01-14 ENCOUNTER — Ambulatory Visit (INDEPENDENT_AMBULATORY_CARE_PROVIDER_SITE_OTHER): Payer: Medicare HMO | Admitting: Licensed Clinical Social Worker

## 2019-01-14 ENCOUNTER — Encounter (HOSPITAL_COMMUNITY): Payer: Self-pay | Admitting: Licensed Clinical Social Worker

## 2019-01-14 ENCOUNTER — Other Ambulatory Visit (HOSPITAL_COMMUNITY): Payer: Self-pay | Admitting: Psychiatry

## 2019-01-14 DIAGNOSIS — F331 Major depressive disorder, recurrent, moderate: Secondary | ICD-10-CM

## 2019-01-14 MED ORDER — ALPRAZOLAM 1 MG PO TABS: 1.0000 mg | ORAL_TABLET | Freq: Every day | ORAL | 0 refills | Status: DC | PRN
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
## 2019-01-18 ENCOUNTER — Other Ambulatory Visit: Payer: Self-pay

## 2019-01-18 ENCOUNTER — Encounter (HOSPITAL_COMMUNITY): Payer: Self-pay | Admitting: Psychiatry

## 2019-01-18 ENCOUNTER — Ambulatory Visit (INDEPENDENT_AMBULATORY_CARE_PROVIDER_SITE_OTHER): Payer: Medicare HMO | Admitting: Psychiatry

## 2019-01-18 DIAGNOSIS — F330 Major depressive disorder, recurrent, mild: Secondary | ICD-10-CM

## 2019-01-18 MED ORDER — DULOXETINE HCL 60 MG PO CPEP: ORAL_CAPSULE | ORAL | 0 refills | Status: DC

## 2019-01-18 MED ORDER — ALPRAZOLAM 1 MG PO TABS: 1.0000 mg | ORAL_TABLET | Freq: Every day | ORAL | 1 refills | Status: DC | PRN

## 2019-01-18 MED ORDER — DULOXETINE HCL 30 MG PO CPEP: ORAL_CAPSULE | ORAL | 0 refills | Status: DC

## 2019-01-18 NOTE — Patient Instructions (Signed)
1. Continue duloxetine 90 mg (60 mg + 30 mg) daily  2. Decrease Paxil 10 mg daily for one week, then discontinue  3. Continue Xanax 1 mg daily as needed for anxiety  07/02 4.Next appointment: 9/22 at 9 AM

## 2019-02-03 ENCOUNTER — Ambulatory Visit (INDEPENDENT_AMBULATORY_CARE_PROVIDER_SITE_OTHER): Payer: Medicare HMO | Admitting: Licensed Clinical Social Worker

## 2019-02-03 ENCOUNTER — Other Ambulatory Visit: Payer: Self-pay

## 2019-02-03 ENCOUNTER — Encounter (HOSPITAL_COMMUNITY): Payer: Self-pay | Admitting: Licensed Clinical Social Worker

## 2019-02-03 DIAGNOSIS — F331 Major depressive disorder, recurrent, moderate: Secondary | ICD-10-CM | POA: Diagnosis not present
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
## 2019-02-12 ENCOUNTER — Encounter: Payer: Self-pay | Admitting: Physical Medicine & Rehabilitation

## 2019-02-12 ENCOUNTER — Encounter: Payer: Medicare HMO | Attending: Physical Medicine & Rehabilitation | Admitting: Physical Medicine & Rehabilitation

## 2019-02-12 ENCOUNTER — Other Ambulatory Visit: Payer: Self-pay

## 2019-02-12 VITALS — BP 90/54 | HR 77 | Temp 97.7°F | Resp 12 | Ht 71.0 in | Wt 273.0 lb

## 2019-02-12 DIAGNOSIS — M1990 Unspecified osteoarthritis, unspecified site: Secondary | ICD-10-CM | POA: Diagnosis not present

## 2019-02-12 DIAGNOSIS — E782 Mixed hyperlipidemia: Secondary | ICD-10-CM | POA: Diagnosis not present

## 2019-02-12 DIAGNOSIS — I110 Hypertensive heart disease with heart failure: Secondary | ICD-10-CM | POA: Diagnosis not present

## 2019-02-12 DIAGNOSIS — I252 Old myocardial infarction: Secondary | ICD-10-CM | POA: Diagnosis not present

## 2019-02-12 DIAGNOSIS — I509 Heart failure, unspecified: Secondary | ICD-10-CM | POA: Diagnosis not present

## 2019-02-12 DIAGNOSIS — G4733 Obstructive sleep apnea (adult) (pediatric): Secondary | ICD-10-CM | POA: Diagnosis not present

## 2019-02-12 DIAGNOSIS — F17210 Nicotine dependence, cigarettes, uncomplicated: Secondary | ICD-10-CM | POA: Insufficient documentation

## 2019-02-12 DIAGNOSIS — F419 Anxiety disorder, unspecified: Secondary | ICD-10-CM | POA: Insufficient documentation

## 2019-02-12 DIAGNOSIS — I2510 Atherosclerotic heart disease of native coronary artery without angina pectoris: Secondary | ICD-10-CM | POA: Diagnosis not present

## 2019-02-12 DIAGNOSIS — E119 Type 2 diabetes mellitus without complications: Secondary | ICD-10-CM | POA: Insufficient documentation

## 2019-02-12 DIAGNOSIS — G8110 Spastic hemiplegia affecting unspecified side: Secondary | ICD-10-CM | POA: Diagnosis not present

## 2019-02-12 DIAGNOSIS — J449 Chronic obstructive pulmonary disease, unspecified: Secondary | ICD-10-CM | POA: Insufficient documentation

## 2019-02-12 DIAGNOSIS — I69354 Hemiplegia and hemiparesis following cerebral infarction affecting left non-dominant side: Secondary | ICD-10-CM | POA: Diagnosis not present

## 2019-02-12 NOTE — Patient Instructions (Signed)
Will put some botulinum in Left shoulder area and less in calf

## 2019-02-12 NOTE — Progress Notes (Signed)
Subjective:    Patient ID: [name], male    DOB: [birth year], 53 y.o.   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  He states that his elbow feels a little tight.  His left shoulder droops in the socket. His left foot does not turn in quite as much although it still does that a little bit. No injection site issues. Pain Inventory Average Pain 6 Pain Right Now 6 My pain is sharp and aching  In the last 24 hours, has pain interfered with the following? General activity 6 Relation with others 5 Enjoyment of life 5 What TIME of day is your pain at its worst? ALL THE TIME Sleep (in general) Poor  Pain is worse with: walking and inactivity Pain improves with: rest and medication Relief from Meds: 4  Mobility walk with assistance use a cane ability to climb steps?  yes do you drive?  no use a wheelchair needs help with transfers  Function disabled: date disabled n/a I need assistance with the following:  feeding, dressing, bathing, toileting, meal prep and household duties  Neuro/Psych weakness numbness tingling trouble walking depression anxiety  Prior Studies n/a  Physicians involved in your care n/a   Family History  Problem Relation Age of Onset  . Lung cancer Father   . Heart disease Father   . Heart disease Mother   . Congestive Heart Failure Mother   . Diabetes Mother   . Hyperlipidemia Mother   . Alcohol abuse Brother   . Breast cancer Maternal Aunt   . Suicidality Cousin    Social History   Socioeconomic History  . Marital status:  Legally Separated    Spouse name: Jolayne Haines  . Number of children: 2  . Years of education: Not on file  . Highest education level: 12th grade  Occupational History  . Occupation: disabled  Social Needs  . Financial resource strain: Not on file  . Food insecurity    Worry: Not on file    Inability: Not on file  . Transportation needs    Medical: Not on file    Non-medical: Not on file  Tobacco Use  . Smoking status: Current Every Day Smoker    Packs/day: 1.50    Years: 30.00    Pack years: 45.00    Types: Cigarettes    Start date: 06/08/1982  . Smokeless tobacco: Never Used  . Tobacco comment: 1 pack per day 03/04/2018  Substance and Sexual Activity  . Alcohol use: No  . Drug use: No  . Sexual activity: Yes    Birth control/protection: None  Lifestyle  . Physical activity    Days per week: Not on file    Minutes per session: Not on file  . Stress: Not on file  Relationships  . Social Herbalist on phone: Not on file    Gets together: Not on file    Attends religious service: Not on file    Active member of club or organization: Not on file    Attends meetings of clubs or organizations: Not on  file    Relationship status: Not on file  Other Topics Concern  . Not on file  Social History Narrative   Disabled since age 29 lives with wife and children      Patient is right-handed. He is married, lives in a 1 story house, has a ramp to enter. Drinks 64oz of Mtn. Dew a day. Prior to CVA was walking daily. 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   Past Surgical History:  Procedure Laterality Date  . CHOLECYSTECTOMY    . CORONARY ARTERY BYPASS GRAFT  2010   LIMA to LAD, SVG to diagonal, SVG to OM1 and OM 2, SVG to RCA  . DENTAL SURGERY  2003  . GASTRIC BYPASS  2010  . HERNIA REPAIR  2011, 2012  . INCISIONAL HERNIA REPAIR N/A 07/23/2013   Procedure: HERNIA REPAIR INCISIONAL WITH MESH;  Surgeon: Jamesetta So, MD;  Location: [facility];  Service:  General;  Laterality: N/A;  . INSERTION OF MESH N/A 07/23/2013   Procedure: INSERTION OF MESH;  Surgeon: Jamesetta So, MD;  Location: [facility];  Service: General;  Laterality: N/A;  . LEFT HEART CATHETERIZATION WITH CORONARY/GRAFT ANGIOGRAM N/A 11/01/2013   Procedure: LEFT HEART CATHETERIZATION WITH Beatrix Fetters;  Surgeon: Blane Ohara, MD;  Location: [facility] CATH LAB;  Service: Cardiovascular;  Laterality: N/A;  . TOE AMPUTATION  1998   right 1st and 2nd toe  . TONSILECTOMY, ADENOIDECTOMY, BILATERAL MYRINGOTOMY AND TUBES    . TONSILLECTOMY    . VENTRAL HERNIA REPAIR N/A 10/28/2012   Procedure: LAPAROSCOPIC VENTRAL HERNIA;  Surgeon: Donato Heinz, MD;  Location: [facility];  Service: General;  Laterality: N/A;   Past Medical History:  Diagnosis Date  . Anaphylaxis    IGE mediated  . Anxiety   . Arthritis   . Cardiomyopathy ([place])   . CHF (congestive heart failure) ([place])   . COPD (chronic obstructive pulmonary disease) ([place])   . Coronary atherosclerosis of native coronary artery    Multivessel status post CABG 2010  . Depression   . Essential hypertension   . History of CVA (cerebrovascular accident) 01/2012   Right posterior frontal cortical and subcortical brain by MRI, no hemorrhage. Carotid Dopplers showed only 1-50% bilateral ICA stenoses. Echocardiogram showed LVEF 50%, no major valvular abnormalities.  . Mixed hyperlipidemia   . Myocardial infarction ([place]) 2010  . OSA (obstructive sleep apnea)   . Stroke ([place])   . Type 2 diabetes mellitus (HCC)    There were no vitals taken for this visit.  Opioid Risk Score:   Fall Risk Score:  `1  Depression screen PHQ 2/9  Depression screen [facility] 2/9 11/06/2018 08/14/2018 05/19/2018 09/29/2017  Decreased Interest 0 1 1 1   Down, Depressed, Hopeless 0 1 1 3   PHQ - 2 Score 0 2 2 4   Altered sleeping - - - 3  Tired, decreased energy - - - 2  Change in appetite - - - 3  Feeling bad or failure about yourself  - - - 3  Trouble  concentrating - - - 3  Moving slowly or fidgety/restless - - - 2  Suicidal thoughts - - - 0  PHQ-9 Score - - - 20  Difficult doing work/chores - - - Somewhat difficult      Review of Systems  All other systems reviewed and are negative.      Objective:   Physical Exam  Tone MAS 1 in the finger flexors MAS 0 at  the thumb flexor MAS 2 at the elbow flexor MAS 3 at the left pectoralis MAS 2 at the left medial hamstrings MAS 3/4 in the ankle plantar flexors MAS 1 in the foot inverters  Motor strength is to minus at the left deltoid and biceps 0 at the triceps 0 at the finger flexors extensors 3- at the hip knee extensor synergy, trace ankle plantar flexion  Shoulder abduction to approximately 80 degrees external rotation to approximately 20 degrees accompanied by pain    Assessment & Plan:   1.  Left spastic nondominant side hemi-plegia which has had a good response to Dysport injection. We discussed the left pectoralis tone.  He is on a maximum dose of Dysport therefore we would have to reduce dosage in 1 muscle to add to the pectoralis. Given the ankle contracture, would discontinue the soleus injection and instead put the 100 units into the left pectoralis.  This may help somewhat with the shoulder anterior subluxation.  And may help with shoulder range of motion  LUE FDS 100 FDP 100 FPL 100 Biceps 100 Brachiorad 100  Left pec 100 LLE Medial Hamstrings 400  Med gastroc 100 Lat Gastro 100   Tibialis post 300

## 2019-03-01 ENCOUNTER — Other Ambulatory Visit: Payer: Self-pay | Admitting: Family Medicine

## 2019-03-01 DIAGNOSIS — M25512 Pain in left shoulder: Secondary | ICD-10-CM

## 2019-03-26 ENCOUNTER — Encounter: Payer: Medicare HMO | Attending: Physical Medicine & Rehabilitation | Admitting: Physical Medicine & Rehabilitation

## 2019-03-26 ENCOUNTER — Other Ambulatory Visit: Payer: Self-pay

## 2019-03-26 ENCOUNTER — Encounter: Payer: Self-pay | Admitting: Physical Medicine & Rehabilitation

## 2019-03-26 VITALS — BP 110/71 | HR 75 | Temp 98.2°F | Ht 71.0 in | Wt 273.0 lb

## 2019-03-26 DIAGNOSIS — J449 Chronic obstructive pulmonary disease, unspecified: Secondary | ICD-10-CM | POA: Diagnosis not present

## 2019-03-26 DIAGNOSIS — G4733 Obstructive sleep apnea (adult) (pediatric): Secondary | ICD-10-CM | POA: Diagnosis not present

## 2019-03-26 DIAGNOSIS — G8110 Spastic hemiplegia affecting unspecified side: Secondary | ICD-10-CM

## 2019-03-26 DIAGNOSIS — I2510 Atherosclerotic heart disease of native coronary artery without angina pectoris: Secondary | ICD-10-CM | POA: Diagnosis not present

## 2019-03-26 DIAGNOSIS — F419 Anxiety disorder, unspecified: Secondary | ICD-10-CM | POA: Diagnosis not present

## 2019-03-26 DIAGNOSIS — M1990 Unspecified osteoarthritis, unspecified site: Secondary | ICD-10-CM | POA: Insufficient documentation

## 2019-03-26 DIAGNOSIS — E782 Mixed hyperlipidemia: Secondary | ICD-10-CM | POA: Insufficient documentation

## 2019-03-26 DIAGNOSIS — I110 Hypertensive heart disease with heart failure: Secondary | ICD-10-CM | POA: Insufficient documentation

## 2019-03-26 DIAGNOSIS — I252 Old myocardial infarction: Secondary | ICD-10-CM | POA: Insufficient documentation

## 2019-03-26 DIAGNOSIS — I509 Heart failure, unspecified: Secondary | ICD-10-CM | POA: Insufficient documentation

## 2019-03-26 DIAGNOSIS — I69354 Hemiplegia and hemiparesis following cerebral infarction affecting left non-dominant side: Secondary | ICD-10-CM | POA: Insufficient documentation

## 2019-03-26 DIAGNOSIS — E119 Type 2 diabetes mellitus without complications: Secondary | ICD-10-CM | POA: Insufficient documentation

## 2019-03-26 DIAGNOSIS — F17210 Nicotine dependence, cigarettes, uncomplicated: Secondary | ICD-10-CM | POA: Insufficient documentation

## 2019-03-26 NOTE — Progress Notes (Signed)
Dysport Injection for spasticity using needle EMG guidance  Dilution: 200 Units/ml Indication: Severe spasticity which interferes with ADL,mobility and/or  hygiene and is unresponsive to medication management and other conservative care Pt indicates LUE spasticity in shoulder and arm is main issue, hamstring spasms are not bothering him Informed consent was obtained after describing risks and benefits of the procedure with the patient. This includes bleeding, bruising, infection, excessive weakness, or medication side effects. A REMS form is on file and signed. Needle:  needle electrode Number of units per muscle LUE FDS 100 FDP 100 FPL 100 Biceps 200 Brachiorad 100  Left pec 300 LLE   Med gastroc 100 Lat Gastro 100   Tibialis post 300 All injections were done after obtaining appropriate EMG activity and after negative drawback for blood. The patient tolerated the procedure well. Post procedure instructions were given. A followup appointment was made.

## 2019-03-26 NOTE — Patient Instructions (Signed)

## 2019-03-30 ENCOUNTER — Other Ambulatory Visit: Payer: Self-pay

## 2019-03-30 ENCOUNTER — Ambulatory Visit
Admission: RE | Admit: 2019-03-30 | Discharge: 2019-03-30 | Disposition: A | Discharge status: Home / Self Care | Payer: Medicare HMO | Source: Ambulatory Visit | Attending: Family Medicine | Admitting: Family Medicine

## 2019-03-30 DIAGNOSIS — M25512 Pain in left shoulder: Secondary | ICD-10-CM
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
## 2019-04-06 ENCOUNTER — Other Ambulatory Visit: Payer: Self-pay

## 2019-04-06 ENCOUNTER — Ambulatory Visit (INDEPENDENT_AMBULATORY_CARE_PROVIDER_SITE_OTHER): Payer: Medicare HMO | Admitting: Psychiatry

## 2019-04-06 ENCOUNTER — Encounter (HOSPITAL_COMMUNITY): Payer: Self-pay | Admitting: Psychiatry

## 2019-04-06 DIAGNOSIS — F330 Major depressive disorder, recurrent, mild: Secondary | ICD-10-CM | POA: Diagnosis not present

## 2019-04-06 MED ORDER — DULOXETINE HCL 60 MG PO CPEP: ORAL_CAPSULE | ORAL | 1 refills | Status: DC

## 2019-04-06 MED ORDER — ALPRAZOLAM 1 MG PO TABS: 1.0000 mg | ORAL_TABLET | Freq: Every day | ORAL | 3 refills | Status: DC | PRN

## 2019-04-06 MED ORDER — DULOXETINE HCL 30 MG PO CPEP: ORAL_CAPSULE | ORAL | 1 refills | Status: DC

## 2019-04-06 NOTE — Patient Instructions (Signed)
1.Continueduloxetine 90 mg(60 mg + 30 mg)daily  2. Continue Xanax 1 mg daily as needed for anxiety  03/18/2019  4.Next appointment: in three months

## 2019-04-12 ENCOUNTER — Ambulatory Visit (HOSPITAL_COMMUNITY): Payer: Medicare HMO | Admitting: Licensed Clinical Social Worker

## 2019-04-12 ENCOUNTER — Other Ambulatory Visit: Payer: Self-pay

## 2019-05-10 ENCOUNTER — Other Ambulatory Visit: Payer: Self-pay

## 2019-05-10 ENCOUNTER — Encounter: Payer: Self-pay | Admitting: Physical Medicine & Rehabilitation

## 2019-05-10 ENCOUNTER — Encounter: Payer: Medicare HMO | Attending: Physical Medicine & Rehabilitation | Admitting: Physical Medicine & Rehabilitation

## 2019-05-10 VITALS — BP 101/66 | HR 74 | Temp 98.5°F | Ht 71.0 in | Wt 250.0 lb

## 2019-05-10 DIAGNOSIS — I69354 Hemiplegia and hemiparesis following cerebral infarction affecting left non-dominant side: Secondary | ICD-10-CM | POA: Diagnosis not present

## 2019-05-10 DIAGNOSIS — E119 Type 2 diabetes mellitus without complications: Secondary | ICD-10-CM | POA: Diagnosis not present

## 2019-05-10 DIAGNOSIS — G8110 Spastic hemiplegia affecting unspecified side: Secondary | ICD-10-CM

## 2019-05-10 DIAGNOSIS — E782 Mixed hyperlipidemia: Secondary | ICD-10-CM | POA: Insufficient documentation

## 2019-05-10 DIAGNOSIS — I509 Heart failure, unspecified: Secondary | ICD-10-CM | POA: Insufficient documentation

## 2019-05-10 DIAGNOSIS — G4733 Obstructive sleep apnea (adult) (pediatric): Secondary | ICD-10-CM | POA: Insufficient documentation

## 2019-05-10 DIAGNOSIS — F17210 Nicotine dependence, cigarettes, uncomplicated: Secondary | ICD-10-CM | POA: Diagnosis not present

## 2019-05-10 DIAGNOSIS — J449 Chronic obstructive pulmonary disease, unspecified: Secondary | ICD-10-CM | POA: Diagnosis not present

## 2019-05-10 DIAGNOSIS — F419 Anxiety disorder, unspecified: Secondary | ICD-10-CM | POA: Diagnosis not present

## 2019-05-10 DIAGNOSIS — I110 Hypertensive heart disease with heart failure: Secondary | ICD-10-CM | POA: Diagnosis not present

## 2019-05-10 DIAGNOSIS — I2510 Atherosclerotic heart disease of native coronary artery without angina pectoris: Secondary | ICD-10-CM | POA: Diagnosis not present

## 2019-05-10 DIAGNOSIS — M1990 Unspecified osteoarthritis, unspecified site: Secondary | ICD-10-CM | POA: Diagnosis not present

## 2019-05-10 DIAGNOSIS — I252 Old myocardial infarction: Secondary | ICD-10-CM | POA: Diagnosis not present

## 2019-05-10 NOTE — Patient Instructions (Signed)
Will repeat same dose of dysport in 6wks

## 2019-05-10 NOTE — Progress Notes (Signed)
Subjective:    Patient ID: [name], male    DOB: August 07, 1965, 53 y.o.   MRN: [id number]  HPI 53 year old male with prior history of coronary artery disease, MI, diabetes and hypertension developed left hemiparesis 06/14/2017.  MRI showed a right basal ganglia infarct.  He has chronic left hemiplegia with spasticity that is not relieved adequately by oral medications or physical therapy. He returns today after a Dysport injection performed 6 weeks ago in the left upper extremity and the left lower extremity  LUE FDS 100 FDP 100 FPL 100 Biceps 200 Brachiorad 100  Left trunk Left pec 300  LLE   Med gastroc 100 Lat Gastro 100 Tibialis post 300  The patient is pleased at the results.  He states his left upper extremity is more relaxed his caregiver feels like it is easier to move his left arm.  Also his left foot stays on the floor better.  He does ambulate without double metal upright AFO as well as a cane but no physical assistance.  Pain Inventory Average Pain 5 Pain Right Now 4 My pain is sharp  In the last 24 hours, has pain interfered with the following? General activity 8 Relation with others 8 Enjoyment of life 8 What TIME of day is your pain at its worst? evening Sleep (in general) Poor  Pain is worse with: walking Pain improves with: nothing Relief from Meds: 0  Mobility walk with assistance use a cane use a walker ability to climb steps?  yes use a wheelchair  Function disabled: date disabled .  Neuro/Psych weakness numbness tremor tingling trouble walking spasms  Prior Studies Any changes since last visit?  no  Physicians involved in your care Any changes since last visit?  no   Family History  Problem Relation Age of Onset  . Lung cancer Father   . Heart disease Father   . Heart disease Mother   . Congestive Heart Failure Mother   . Diabetes Mother   . Hyperlipidemia Mother   . Alcohol abuse Brother   . Breast  cancer Maternal Aunt   . Suicidality Cousin    Social History   Socioeconomic History  . Marital status: Legally Separated    Spouse name: Jolayne Haines  . Number of children: 2  . Years of education: Not on file  . Highest education level: 12th grade  Occupational History  . Occupation: disabled  Social Needs  . Financial resource strain: Not on file  . Food insecurity    Worry: Not on file    Inability: Not on file  . Transportation needs    Medical: Not on file    Non-medical: Not on file  Tobacco Use  . Smoking status: Current Every Day Smoker    Packs/day: 1.50    Years: 30.00    Pack years: 45.00    Types: Cigarettes    Start date: 06/08/1982  . Smokeless tobacco: Never Used  . Tobacco comment: 1 pack per day 03/04/2018  Substance and Sexual Activity  . Alcohol use: No  . Drug use: No  . Sexual activity: Yes    Birth control/protection: None  Lifestyle  . Physical activity    Days per week: Not on file    Minutes per session: Not on file  . Stress: Not on file  Relationships  . Social Herbalist on phone: Not on file    Gets together: Not on file    Attends religious service: Not  on file    Active member of club or organization: Not on file    Attends meetings of clubs or organizations: Not on file    Relationship status: Not on file  Other Topics Concern  . Not on file  Social History Narrative   Disabled since age 62 lives with wife and children      Patient is right-handed. He is married, lives in a 1 story house, has a ramp to enter. Drinks 64oz of Mtn. Dew a day. Prior to CVA was walking daily. 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   Past Surgical History:  Procedure Laterality Date  . CHOLECYSTECTOMY    . CORONARY ARTERY BYPASS GRAFT  2010   LIMA to LAD, SVG to diagonal, SVG to OM1 and OM 2, SVG to RCA  . DENTAL SURGERY  2003  . GASTRIC BYPASS  2010  . HERNIA REPAIR  2011, 2012  . INCISIONAL HERNIA REPAIR N/A 07/23/2013    Procedure: HERNIA REPAIR INCISIONAL WITH MESH;  Surgeon: Jamesetta So, MD;  Location: [facility];  Service: General;  Laterality: N/A;  . INSERTION OF MESH N/A 07/23/2013   Procedure: INSERTION OF MESH;  Surgeon: Jamesetta So, MD;  Location: [facility];  Service: General;  Laterality: N/A;  . LEFT HEART CATHETERIZATION WITH CORONARY/GRAFT ANGIOGRAM N/A 11/01/2013   Procedure: LEFT HEART CATHETERIZATION WITH Beatrix Fetters;  Surgeon: Blane Ohara, MD;  Location: [facility] CATH LAB;  Service: Cardiovascular;  Laterality: N/A;  . TOE AMPUTATION  1998   right 1st and 2nd toe  . TONSILECTOMY, ADENOIDECTOMY, BILATERAL MYRINGOTOMY AND TUBES    . TONSILLECTOMY    . VENTRAL HERNIA REPAIR N/A 10/28/2012   Procedure: LAPAROSCOPIC VENTRAL HERNIA;  Surgeon: Donato Heinz, MD;  Location: [facility];  Service: General;  Laterality: N/A;   Past Medical History:  Diagnosis Date  . Anaphylaxis    IGE mediated  . Anxiety   . Arthritis   . Cardiomyopathy ([place])   . CHF (congestive heart failure) ([place])   . COPD (chronic obstructive pulmonary disease) ([place])   . Coronary atherosclerosis of native coronary artery    Multivessel status post CABG 2010  . Depression   . Essential hypertension   . History of CVA (cerebrovascular accident) 01/2012   Right posterior frontal cortical and subcortical brain by MRI, no hemorrhage. Carotid Dopplers showed only 1-50% bilateral ICA stenoses. Echocardiogram showed LVEF 50%, no major valvular abnormalities.  . Mixed hyperlipidemia   . Myocardial infarction ([place]) 2010  . OSA (obstructive sleep apnea)   . Stroke ([place])   . Type 2 diabetes mellitus (HCC)    Temp 98.5 F (36.9 C)   Ht 5\' 11"  (1.803 m)   Wt 250 lb (113.4 kg)   BMI 34.87 kg/m   Opioid Risk Score:   Fall Risk Score:  `1  Depression screen PHQ 2/9  Depression screen [facility] 2/9 11/06/2018 08/14/2018 05/19/2018 09/29/2017  Decreased Interest 0 1 1 1   Down, Depressed, Hopeless 0 1 1 3   PHQ - 2 Score 0 2 2 4    Altered sleeping - - - 3  Tired, decreased energy - - - 2  Change in appetite - - - 3  Feeling bad or failure about yourself  - - - 3  Trouble concentrating - - - 3  Moving slowly or fidgety/restless - - - 2  Suicidal thoughts - - - 0  PHQ-9 Score - - - 20  Difficult doing work/chores - - -  Somewhat difficult    Review of Systems  Constitutional: Negative.   HENT: Negative.   Eyes: Negative.   Respiratory: Negative.   Cardiovascular: Negative.   Gastrointestinal: Negative.   Endocrine: Negative.   Genitourinary: Negative.   Musculoskeletal: Positive for gait problem and myalgias.  Skin: Negative.   Allergic/Immunologic: Negative.   Neurological: Positive for tremors, weakness and numbness.       Tingling  Hematological: Negative.   Psychiatric/Behavioral: Negative.   All other systems reviewed and are negative.      Objective:   Physical Exam Vitals signs and nursing note reviewed.  Constitutional:      Appearance: Normal appearance.  Eyes:     Extraocular Movements: Extraocular movements intact.     Conjunctiva/sclera: Conjunctivae normal.     Pupils: Pupils are equal, round, and reactive to light.  Skin:    General: Skin is warm and dry.  Neurological:     General: No focal deficit present.     Mental Status: He is alert and oriented to person, place, and time.     Cranial Nerves: No dysarthria.     Sensory: No sensory deficit.  Psychiatric:        Mood and Affect: Mood normal.        Behavior: Behavior normal.     Tone MAS 1 at the left elbow flexors MAS 1 at the finger flexors and thumb flexor except for digits 4 and 5 which are MAS 2 MAS 1 at the gastroc  Motor strength is trace in the left upper extremity finger flexors and to minus at the elbow flexors Left lower extremity has 4 - hip/knee extensor synergy and 0 at the ankle      Assessment & Plan:  1.  Left spastic hemiplegia, tone well controlled by current dosing and muscle group selection  using Dysport 1500 units every 3 months.  Will repeat in 6 weeks.

## 2019-06-29 ENCOUNTER — Encounter: Payer: Self-pay | Admitting: Physical Medicine & Rehabilitation

## 2019-06-29 ENCOUNTER — Other Ambulatory Visit: Payer: Self-pay

## 2019-06-29 ENCOUNTER — Telehealth: Payer: Self-pay | Admitting: Cardiology

## 2019-06-29 ENCOUNTER — Encounter: Payer: Medicare HMO | Attending: Physical Medicine & Rehabilitation | Admitting: Physical Medicine & Rehabilitation

## 2019-06-29 VITALS — BP 84/48 | HR 70 | Temp 98.0°F | Ht 71.0 in | Wt 274.0 lb

## 2019-06-29 DIAGNOSIS — J449 Chronic obstructive pulmonary disease, unspecified: Secondary | ICD-10-CM | POA: Diagnosis not present

## 2019-06-29 DIAGNOSIS — I2510 Atherosclerotic heart disease of native coronary artery without angina pectoris: Secondary | ICD-10-CM | POA: Insufficient documentation

## 2019-06-29 DIAGNOSIS — F17210 Nicotine dependence, cigarettes, uncomplicated: Secondary | ICD-10-CM | POA: Insufficient documentation

## 2019-06-29 DIAGNOSIS — G4733 Obstructive sleep apnea (adult) (pediatric): Secondary | ICD-10-CM | POA: Insufficient documentation

## 2019-06-29 DIAGNOSIS — M1990 Unspecified osteoarthritis, unspecified site: Secondary | ICD-10-CM | POA: Insufficient documentation

## 2019-06-29 DIAGNOSIS — I69354 Hemiplegia and hemiparesis following cerebral infarction affecting left non-dominant side: Secondary | ICD-10-CM | POA: Insufficient documentation

## 2019-06-29 DIAGNOSIS — I509 Heart failure, unspecified: Secondary | ICD-10-CM | POA: Insufficient documentation

## 2019-06-29 DIAGNOSIS — G8110 Spastic hemiplegia affecting unspecified side: Secondary | ICD-10-CM

## 2019-06-29 DIAGNOSIS — E782 Mixed hyperlipidemia: Secondary | ICD-10-CM | POA: Insufficient documentation

## 2019-06-29 DIAGNOSIS — I252 Old myocardial infarction: Secondary | ICD-10-CM | POA: Diagnosis not present

## 2019-06-29 DIAGNOSIS — F419 Anxiety disorder, unspecified: Secondary | ICD-10-CM | POA: Insufficient documentation

## 2019-06-29 DIAGNOSIS — I110 Hypertensive heart disease with heart failure: Secondary | ICD-10-CM | POA: Insufficient documentation

## 2019-06-29 DIAGNOSIS — E119 Type 2 diabetes mellitus without complications: Secondary | ICD-10-CM | POA: Diagnosis not present

## 2019-06-29 NOTE — Progress Notes (Signed)
Dysport Injection for spasticity using needle EMG guidance  Dilution: 200 Units/ml Indication: Severe spasticity which interferes with ADL,mobility and/or  hygiene and is unresponsive to medication management and other conservative care Pt indicates LUE spasticity in shoulder and arm is main issue, hamstring spasms are not bothering him Informed consent was obtained after describing risks and benefits of the procedure with the patient. This includes bleeding, bruising, infection, excessive weakness, or medication side effects. A REMS form is on file and signed. Needle:  needle electrode Number of units per muscle LUE FDS 100 FDP 100 FPL 100 Biceps 200 Brachiorad 100  Trunk Left pec 300  LLE  Med gastroc 100 Lat Gastro 100 Tibialis post 300 All injections were done after obtaining appropriate EMG activity and after negative drawback for blood. The patient tolerated the procedure well. Post procedure instructions were given. A followup appointment was made.

## 2019-06-29 NOTE — Patient Instructions (Signed)

## 2019-06-29 NOTE — Telephone Encounter (Signed)
I looked at the note.  He has tended to run a relatively low blood pressure and has generally not been symptomatic.  Unless he was clearly symptomatic today, I would rather have more data points prior to considering cutting back his Entresto since he has tolerated it well.  Does he have any home blood pressure recordings?

## 2019-06-29 NOTE — Telephone Encounter (Signed)
Blood Pressure at doctor's office today 84/48. Was told to let Dr Domenic Polite know

## 2019-06-29 NOTE — Telephone Encounter (Signed)
Contacted patient to f/u and get more data. Patient denies dizziness, sob, lightheadedness or fatigue. Reports checking BP daily but does not right them down. Advised to start documenting BP readings, and call office in one week with his numbers. Advised patient if he develops any symptoms listed above, to contact our office. Verbalized understanding of plan.

## 2019-06-30 ENCOUNTER — Ambulatory Visit (INDEPENDENT_AMBULATORY_CARE_PROVIDER_SITE_OTHER): Payer: Medicare HMO

## 2019-06-30 DIAGNOSIS — I255 Ischemic cardiomyopathy: Secondary | ICD-10-CM | POA: Diagnosis not present

## 2019-07-01 ENCOUNTER — Telehealth: Payer: Self-pay | Admitting: *Deleted

## 2019-07-01 NOTE — Telephone Encounter (Signed)
Patient informed. Copy sent to PCP °

## 2019-07-01 NOTE — Telephone Encounter (Signed)
-----   Message from Satira Sark, MD sent at 06/30/2019 11:55 AM EST ----- Results reviewed.  Difficult images, however LVEF 40 to 50% range is a positive finding compared to prior assessment and plan will be to continue medical therapy and observation.

## 2019-07-06 ENCOUNTER — Ambulatory Visit (HOSPITAL_COMMUNITY): Payer: Medicare HMO | Admitting: Psychiatry

## 2019-07-12 ENCOUNTER — Other Ambulatory Visit: Payer: Self-pay | Admitting: Family Medicine

## 2019-07-12 DIAGNOSIS — M25512 Pain in left shoulder: Secondary | ICD-10-CM

## 2019-07-15 ENCOUNTER — Other Ambulatory Visit: Payer: Self-pay | Admitting: Cardiology

## 2019-07-23 ENCOUNTER — Encounter: Payer: Self-pay | Admitting: Psychiatry

## 2019-07-23 ENCOUNTER — Other Ambulatory Visit: Payer: Self-pay

## 2019-07-23 ENCOUNTER — Ambulatory Visit (INDEPENDENT_AMBULATORY_CARE_PROVIDER_SITE_OTHER): Payer: Medicare HMO | Admitting: Psychiatry

## 2019-07-23 DIAGNOSIS — Z62810 Personal history of physical and sexual abuse in childhood: Secondary | ICD-10-CM | POA: Diagnosis not present

## 2019-07-23 DIAGNOSIS — F419 Anxiety disorder, unspecified: Secondary | ICD-10-CM

## 2019-07-23 DIAGNOSIS — F3342 Major depressive disorder, recurrent, in full remission: Secondary | ICD-10-CM | POA: Diagnosis not present

## 2019-07-23 DIAGNOSIS — F330 Major depressive disorder, recurrent, mild: Secondary | ICD-10-CM | POA: Insufficient documentation

## 2019-07-23 MED ORDER — ALPRAZOLAM 1 MG PO TABS: 1.0000 mg | ORAL_TABLET | Freq: Every day | ORAL | 2 refills | Status: DC | PRN

## 2019-07-23 MED ORDER — DULOXETINE HCL 30 MG PO CPEP: ORAL_CAPSULE | ORAL | 0 refills | Status: DC

## 2019-07-23 MED ORDER — DULOXETINE HCL 60 MG PO CPEP: ORAL_CAPSULE | ORAL | 0 refills | Status: DC
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
## 2019-08-04 ENCOUNTER — Telehealth: Payer: Self-pay | Admitting: Neurology

## 2019-08-04 NOTE — Telephone Encounter (Signed)
Patient called and said he has been having headaches in the back of his head for about a month.  He also said he is having a CT scan of his shoulder on 08/10/19 at [place]. He asked if Dr. Tomi Likens could add on a scan of his head that day due to his recent symptoms as noted above.

## 2019-08-04 NOTE — Telephone Encounter (Signed)
I can't do that until I see him

## 2019-08-04 NOTE — Telephone Encounter (Signed)
Pt called informed that Dr. Tomi Likens can not order any scans until he is seen at his appointment

## 2019-08-05 NOTE — Progress Notes (Signed)
Virtual Visit via Telephone Note   This visit type was conducted due to national recommendations for restrictions regarding the COVID-19 Pandemic (e.g. social distancing) in an effort to limit this patient's exposure and mitigate transmission in our community.  Due to his co-morbid illnesses, this patient is at least at moderate risk for complications without adequate follow up.  This format is felt to be most appropriate for this patient at this time.  The patient did not have access to video technology/had technical difficulties with video requiring transitioning to audio format only (telephone).  All issues noted in this document were discussed and addressed.  No physical exam could be performed with this format.  Please refer to the patient's chart for his  consent to telehealth for [facility].   Date:  08/06/2019   ID:  [name], DOB [birth year], MRN [id number]  Patient Location: Home Provider Location: Office  PCP:  Caryl Bis, MD  Cardiologist:  Rozann Lesches, MD Electrophysiologist:  None   Evaluation Performed:  Follow-Up Visit  Chief Complaint:  Cardiac follow-up  History of Present Illness:    [name] is a 54 y.o. male  last assessed via telehealth encounter in June 2020. He was scheduled for an office visit today, but called to make this a virtual encounter instead. He states that he feels fatigued, recently lost his sense of taste, has been coughing. States that his daughter was diagnosed with COVID-19, and his symptoms also seem consistent with this. He plans to get tested and his PCP is aware.  He states that he has been able to take his regular cardiac medications. He has had some sharp chest discomfort associated with coughing, no increasing nitrate use.  Follow-up echocardiogram in December 2020 showed LVEF 40 to 50% range with anteroseptal and apical hypokinesis.    Past Medical History:  Diagnosis Date  . Anaphylaxis    IGE  mediated  . Anxiety   . Arthritis   . Cardiomyopathy ([place])   . CHF (congestive heart failure) ([place])   . COPD (chronic obstructive pulmonary disease) ([place])   . Coronary atherosclerosis of native coronary artery    Multivessel status post CABG 2010  . Depression   . Essential hypertension   . History of CVA (cerebrovascular accident) 01/2012   Right posterior frontal cortical and subcortical brain by MRI, no hemorrhage. Carotid Dopplers showed only 1-50% bilateral ICA stenoses. Echocardiogram showed LVEF 50%, no major valvular abnormalities.  . Mixed hyperlipidemia   . Myocardial infarction ([place]) 2010  . OSA (obstructive sleep apnea)   . Stroke ([place])   . Type 2 diabetes mellitus ([place])    Past Surgical History:  Procedure Laterality Date  . CHOLECYSTECTOMY    . CORONARY ARTERY BYPASS GRAFT  2010   LIMA to LAD, SVG to diagonal, SVG to OM1 and OM 2, SVG to RCA  . DENTAL SURGERY  2003  . GASTRIC BYPASS  2010  . HERNIA REPAIR  2011, 2012  . INCISIONAL HERNIA REPAIR N/A 07/23/2013   Procedure: HERNIA REPAIR INCISIONAL WITH MESH;  Surgeon: Jamesetta So, MD;  Location: [facility];  Service: General;  Laterality: N/A;  . INSERTION OF MESH N/A 07/23/2013   Procedure: INSERTION OF MESH;  Surgeon: Jamesetta So, MD;  Location: [facility];  Service: General;  Laterality: N/A;  . LEFT HEART CATHETERIZATION WITH CORONARY/GRAFT ANGIOGRAM N/A 11/01/2013   Procedure: LEFT HEART CATHETERIZATION WITH Beatrix Fetters;  Surgeon: Blane Ohara, MD;  Location: [facility]  CATH LAB;  Service: Cardiovascular;  Laterality: N/A;  . TOE AMPUTATION  1998   right 1st and 2nd toe  . TONSILECTOMY, ADENOIDECTOMY, BILATERAL MYRINGOTOMY AND TUBES    . TONSILLECTOMY    . VENTRAL HERNIA REPAIR N/A 10/28/2012   Procedure: LAPAROSCOPIC VENTRAL HERNIA;  Surgeon: Donato Heinz, MD;  Location: [facility];  Service: General;  Laterality: N/A;     Current Meds  Medication Sig  . albuterol (PROVENTIL HFA;VENTOLIN HFA) 108 (90 BASE)  MCG/ACT inhaler Inhale 2 puffs into the lungs every 6 (six) hours as needed for wheezing or shortness of breath.  . ALPRAZolam (XANAX) 1 MG tablet Take 1 tablet (1 mg total) by mouth daily as needed for anxiety.  [name] aspirin EC 81 MG tablet Take 1 tablet (81 mg total) by mouth daily.  [name] atorvastatin (LIPITOR) 40 MG tablet Take 1 tablet by mouth daily.  . baclofen (LIORESAL) 10 MG tablet Take 1 tablet (10 mg total) by mouth 3 (three) times daily as needed for muscle spasms.  . carvedilol (COREG) 6.25 MG tablet Take 6.25 mg by mouth 2 (two) times daily with a meal.  . Cholecalciferol (VITAMIN D) 2000 units tablet Take 2,000 Units by mouth daily at 12 noon.  . clopidogrel (PLAVIX) 75 MG tablet Take 1 tablet by mouth daily.  . DULoxetine (CYMBALTA) 30 MG capsule Take 90 mg daily (60 mg + 30 mg)  . DULoxetine (CYMBALTA) 60 MG capsule Take 90 mg daily (60 mg + 30 mg)  . ENTRESTO 49-51 MG TAKE (1) TABLET TWICE DAILY.  . fenofibrate micronized (LOFIBRA) 134 MG capsule Take 134 mg by mouth daily before breakfast.   . fluticasone (CUTIVATE) 0.05 % cream Apply 1 application topically 2 (two) times daily.  . furosemide (LASIX) 20 MG tablet Take 20 mg by mouth daily.   [name] gabapentin (NEURONTIN) 600 MG tablet Take 1 tablet by mouth 3 (three) times daily.  . isosorbide mononitrate (IMDUR) 60 MG 24 hr tablet Take 60 mg by mouth 2 (two) times daily.   . nitroGLYCERIN (NITROSTAT) 0.4 MG SL tablet Place 0.4 mg under the tongue every 5 (five) minutes as needed. For chest pain  . oxyCODONE (OXY IR/ROXICODONE) 5 MG immediate release tablet Take 5 mg by mouth every 6 (six) hours as needed for severe pain.   . pantoprazole (PROTONIX) 40 MG tablet Take 40 mg by mouth daily.  . phentermine 37.5 MG capsule Take 1 capsule by mouth daily.  . vitamin B-12 (CYANOCOBALAMIN) 1000 MCG tablet Take 1,000 mcg by mouth daily at 12 noon.     Allergies:   Contrast media [iodinated diagnostic agents], Ibuprofen, and Nsaids   Social  History   Tobacco Use  . Smoking status: Current Every Day Smoker    Packs/day: 1.50    Years: 30.00    Pack years: 45.00    Types: Cigarettes    Start date: 06/08/1982  . Smokeless tobacco: Never Used  . Tobacco comment: 1 pack per day 03/04/2018  Substance Use Topics  . Alcohol use: No  . Drug use: No     Family Hx: The patient's family history includes Alcohol abuse in his brother; Breast cancer in his maternal aunt; Congestive Heart Failure in his mother; Diabetes in his mother; Heart disease in his father and mother; Hyperlipidemia in his mother; Lung cancer in his father; Suicidality in his cousin.  ROS:   Please see the history of present illness. All other systems reviewed and are negative.  Prior CV studies:   The following studies were reviewed today:  Lexiscan Myoview 06/18/2017([facility]): FINDINGS: Large fixed defect is present involving the apex and large portion of the periapical anterior wall. No evidence of ischemia. Wall motion analysis reveals apical dyskinesis and akinesis. Left ventricular ejection fraction is calculated to be 18%.   Echocardiogram 06/30/2019:  1. Left ventricular ejection fraction, by visual estimation, is 40-50%. The left ventricle has Low normal to mildly decreased function. There is no left ventricular hypertrophy.  2. Left ventricular diastolic parameters are indeterminate.  3. Very difficult visualization of the LV, difficult interpretation of LVEF and wall motion. Roughly estimated LVEF is in the 40-50% range. There is probable hypokinesis of the anteroseptal wall and apex. Recommend contrast study for better evaluation.  4. Global right ventricle was not well visualized.The right ventricular size is not well visualized. Right vetricular wall thickness was not assessed.  5. Left atrial size was not well visualized.  6. Right atrial size was not well visualized.  7. The pericardium was not well visualized.  8. The mitral valve was not  well visualized. No evidence of mitral valve regurgitation. No evidence of mitral stenosis.  9. The tricuspid valve is not well visualized. Tricuspid valve regurgitation is not demonstrated. 10. The aortic valve was not well visualized. Aortic valve regurgitation is mild. No evidence of aortic valve sclerosis or stenosis. 11. The pulmonic valve was not well visualized. Pulmonic valve regurgitation is not visualized. 12. The interatrial septum was not well visualized.  Labs/Other Tests and Data Reviewed:    EKG:  An ECG dated 07/01/2018 was personally reviewed today and demonstrated:  Sinus rhythm with old anterior infarct pattern, nonspecific ST-T changes.  Recent Labs:  April 2020: TSH 3.04, cholesterol 147, triglycerides 227, HDL 20, LDL 82, BUN 21, creatinine 1.4, potassium 4.3, AST 14, ALT 20  Wt Readings from Last 3 Encounters:  08/06/19 224 lb (101.6 kg)  06/29/19 274 lb (124.3 kg)  05/10/19 250 lb (113.4 kg)     Objective:    Vital Signs:  BP (!) 126/91   Pulse 99   Ht 5\' 11"  (1.803 m)   Wt 224 lb (101.6 kg)   BMI 31.24 kg/m    Patient spoke in complete sentences, not clearly short of breath at rest. Occasional cough, no obvious wheezing.  ASSESSMENT & PLAN:    1. Multivessel CAD status post CABG with patent bypass graft in 2015 and follow-up Myoview in December 2018 consistent with large infarct scar but no active ischemia. He does not report any accelerating angina and we plan to continue medical therapy. He is currently on aspirin, Plavix, Lipitor, Imdur, and Coreg.  2. Ischemic cardiomyopathy with improvement in LVEF, most recently 40 to 50% range as of December 2020. Continue Coreg, Entresto, and Lasix.  3. Mixed hyperlipidemia on Lipitor. Last LDL 82.  4. Symptoms concerning for COVID-19. Patient plans to get tested and states that his PCP is aware.   Time:   Today, I have spent 6 minutes with the patient with telehealth technology discussing the above  problems.     Medication Adjustments/Labs and Tests Ordered: Current medicines are reviewed at length with the patient today.  Concerns regarding medicines are outlined above.   Tests Ordered: No orders of the defined types were placed in this encounter.   Medication Changes: No orders of the defined types were placed in this encounter.   Follow Up:  In Person 3 months in the [place] office.  Signed,  Rozann Lesches, MD  08/06/2019 9:00 AM     [facility]

## 2019-08-06 ENCOUNTER — Telehealth (INDEPENDENT_AMBULATORY_CARE_PROVIDER_SITE_OTHER): Payer: Medicare HMO | Admitting: Cardiology

## 2019-08-06 ENCOUNTER — Encounter: Payer: Self-pay | Admitting: Cardiology

## 2019-08-06 VITALS — BP 126/91 | HR 99 | Ht 71.0 in | Wt 224.0 lb

## 2019-08-06 DIAGNOSIS — I25119 Atherosclerotic heart disease of native coronary artery with unspecified angina pectoris: Secondary | ICD-10-CM

## 2019-08-06 DIAGNOSIS — E782 Mixed hyperlipidemia: Secondary | ICD-10-CM | POA: Diagnosis not present

## 2019-08-06 DIAGNOSIS — I255 Ischemic cardiomyopathy: Secondary | ICD-10-CM

## 2019-08-06 DIAGNOSIS — Z8673 Personal history of transient ischemic attack (TIA), and cerebral infarction without residual deficits: Secondary | ICD-10-CM

## 2019-08-06 NOTE — Patient Instructions (Addendum)
Medication Instructions:   Your physician recommends that you continue on your current medications as directed. Please refer to the Current Medication list given to you today.  Labwork:  NONE  Testing/Procedures:  NONE  Follow-Up:  Your physician recommends that you schedule a follow-up appointment in: 3 months (office).  Any Other Special Instructions Will Be Listed Below (If Applicable).  If you need a refill on your cardiac medications before your next appointment, please call your pharmacy. 

## 2019-08-08 NOTE — Progress Notes (Signed)
NEUROLOGY FOLLOW UP OFFICE NOTE  [name] [id number]  HISTORY OF PRESENT ILLNESS: [name] is a 54 year old right-handed man with hypertension, type 2 diabetes mellitus, CAD, ischemic cardiomyopathy, COPD, tobacco use disorder, depression and history of prior strokes who follows up for stroke and memory deficits.  He is accompanied by his wife who supplements history.  UPDATE: Current medications:  ASA 81mg , Plavix 75mg , atorvastatin 80mg , Coreg, Imdur, Lasix, alprazolam 1mg  PRN, baclofen 10mg  TID PRN, Cymbalta 90mg  daily, gabapentin 600mg  TID  10/21/2018 LDL 82 06/30/2019 Echocardiogram:  LV EF 40-50%  Headaches returned a month ago.  Right sided 10/10 throbbing/pressure pain in the temple radiating to back of head.  Associated nausea, blurred vision, photophobia.  They last 2 hours and occur 2 to 3 days a week.  He takes Tylenol.  For the past month, he also notes that his right hand twitches about every other day, lasting 20-30 minutes.  It may occur while holding a cup but also at rest.  No recent change or added medications.  He reported cough and loss of taste.  He was concerned about Covid-19 but taste returned and cough resolved after 1 to 2 days.  He sees Dr. Letta Pate for Dysport injection to treat spasticity.  He reports panic attacks.  He sees Dr. Toy Care for depression and anxiety.    HISTORY: He was admitted to [facility] on 06/14/17 with sudden onset left sided weakness and numbness and difficulty speaking. He initially presented to [facility] where he received tPA with NIHSS of 15 and was then transferred to [facility]. CT of head from [facility] reportedly showed "acute infarction involving portions of the posterior right basal ganglia, right corona radiata and body of the right caudate nucleus". MRI of brain at [facility] confirmed acute right basal ganglia infarct as well as remote right frontal infarct. MRA of head and carotid  doppler revealed no significant intracranial or extracranial arterial stenosis or occlusion. He was unable to have CTA due to allergy to iodinated contrast. Echocardiogram with bubble study showed EF of 25-30% with no evidence of PFO or ASD. He was evaluated by cardiology for ischemic cardiomyopathy. Lexiscan nuclear perfusion study revealed EF 18% with large infarct involving the apex and periapical anterior wall with no evidence of ischemia. He was advised to follow up with outpatient cardiology. LDL was 123 and triglycerides were 237. Hgb A1c was 5.7. He was already taking ASA 325mg  but not daily, as well as Plavix. He is continued on ASA and Plavix. He was continued on rosuvastatin 40mg , Zetia 10mg  and Tricor 145mg  daily.  He was seen in the ED at [facility] on 09/22/17 for headache. CT of head showed no acute findings but was read as demonstrating a chronic appearing infarct in the right posterior limb of internal capsule that was not present on prior CT from 06/14/17. He also complained of left leg pain. He reportedly had an elevated d dimer. CT chest and venous doppler of lower extremity were negative for DVT. However, the started him on Xarelto. He was told to stop Plavix by his PCP. I contacted his PCP, Dr. Quillian Quince, who reviewed the ED notes and is also unsure why they started Xarelto when imaging was negative for DVT.  He was advised to discontinue Xarelto and restart Plavix with aspirin.  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  He did go to PT  and speech therapy.     PAST MEDICAL HISTORY: Past Medical History:  Diagnosis Date  . Anaphylaxis    IGE mediated  . Anxiety   . Arthritis   . Cardiomyopathy ([place])   . CHF (congestive heart failure) ([place])   . COPD (chronic obstructive pulmonary disease)  ([place])   . Coronary atherosclerosis of native coronary artery    Multivessel status post CABG 2010  . Depression   . Essential hypertension   . History of CVA (cerebrovascular accident) 01/2012   Right posterior frontal cortical and subcortical brain by MRI, no hemorrhage. Carotid Dopplers showed only 1-50% bilateral ICA stenoses. Echocardiogram showed LVEF 50%, no major valvular abnormalities.  . Mixed hyperlipidemia   . Myocardial infarction ([place]) 2010  . OSA (obstructive sleep apnea)   . Stroke ([place])   . Type 2 diabetes mellitus ([facility])     MEDICATIONS: Current Outpatient Medications on File Prior to Visit  Medication Sig Dispense Refill  . albuterol (PROVENTIL HFA;VENTOLIN HFA) 108 (90 BASE) MCG/ACT inhaler Inhale 2 puffs into the lungs every 6 (six) hours as needed for wheezing or shortness of breath.    . ALPRAZolam (XANAX) 1 MG tablet Take 1 tablet (1 mg total) by mouth daily as needed for anxiety. 30 tablet 2  . aspirin EC 81 MG tablet Take 1 tablet (81 mg total) by mouth daily. 90 tablet 3  . atorvastatin (LIPITOR) 40 MG tablet Take 1 tablet by mouth daily.    . baclofen (LIORESAL) 10 MG tablet Take 1 tablet (10 mg total) by mouth 3 (three) times daily as needed for muscle spasms. 90 each 1  . carvedilol (COREG) 6.25 MG tablet Take 6.25 mg by mouth 2 (two) times daily with a meal.    . Cholecalciferol (VITAMIN D) 2000 units tablet Take 2,000 Units by mouth daily at 12 noon.    . clopidogrel (PLAVIX) 75 MG tablet Take 1 tablet by mouth daily.    . DULoxetine (CYMBALTA) 30 MG capsule Take 90 mg daily (60 mg + 30 mg) 90 capsule 0  . DULoxetine (CYMBALTA) 60 MG capsule Take 90 mg daily (60 mg + 30 mg) 90 capsule 0  . ENTRESTO 49-51 MG TAKE (1) TABLET TWICE DAILY. 60 tablet 6  . fenofibrate micronized (LOFIBRA) 134 MG capsule Take 134 mg by mouth daily before breakfast.     . fluticasone (CUTIVATE) 0.05 % cream Apply 1 application topically 2 (two) times daily.    . furosemide (LASIX)  20 MG tablet Take 20 mg by mouth daily.     [name] gabapentin (NEURONTIN) 600 MG tablet Take 1 tablet by mouth 3 (three) times daily.    . isosorbide mononitrate (IMDUR) 60 MG 24 hr tablet Take 60 mg by mouth 2 (two) times daily.     . nitroGLYCERIN (NITROSTAT) 0.4 MG SL tablet Place 0.4 mg under the tongue every 5 (five) minutes as needed. For chest pain    . oxyCODONE (OXY IR/ROXICODONE) 5 MG immediate release tablet Take 5 mg by mouth every 6 (six) hours as needed for severe pain.     . pantoprazole (PROTONIX) 40 MG tablet Take 40 mg by mouth daily.    . phentermine 37.5 MG capsule Take 1 capsule by mouth daily.    . vitamin B-12 (CYANOCOBALAMIN) 1000 MCG tablet Take 1,000 mcg by mouth daily at 12 noon.     No current facility-administered medications on file prior to visit.    ALLERGIES: Allergies  Allergen Reactions  .  Contrast Media [Iodinated Diagnostic Agents] Anaphylaxis, Shortness Of Breath, Swelling and Rash    Isovue contrast is most acceptable agent based on previous experience and testing with premedications  . Ibuprofen Anaphylaxis, Hives and Swelling  . Nsaids Anaphylaxis, Hives, Swelling and Rash    Can take Aspirin 325 mg or lower    FAMILY HISTORY: Family History  Problem Relation Age of Onset  . Lung cancer Father   . Heart disease Father   . Heart disease Mother   . Congestive Heart Failure Mother   . Diabetes Mother   . Hyperlipidemia Mother   . Alcohol abuse Brother   . Breast cancer Maternal Aunt   . Suicidality Cousin     SOCIAL HISTORY: Social History   Socioeconomic History  . Marital status: Legally Separated    Spouse name: Jolayne Haines  . Number of children: 2  . Years of education: Not on file  . Highest education level: 12th grade  Occupational History  . Occupation: disabled  Tobacco Use  . Smoking status: Current Every Day Smoker    Packs/day: 1.50    Years: 30.00    Pack years: 45.00    Types: Cigarettes    Start date: 06/08/1982  .  Smokeless tobacco: Never Used  . Tobacco comment: 1 pack per day 03/04/2018  Substance and Sexual Activity  . Alcohol use: No  . Drug use: No  . Sexual activity: Yes    Birth control/protection: None  Other Topics Concern  . Not on file  Social History Narrative   Disabled since age 35 lives with wife and children      Patient is right-handed. He is married, lives in a 1 story house, has a ramp to enter. Drinks 64oz of Mtn. Dew a day. Prior to CVA was walking daily. 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   Social Determinants of Health   Financial Resource Strain:   . Difficulty of Paying Living Expenses: Not on file  Food Insecurity:   . Worried About Charity fundraiser in the Last Year: Not on file  . Ran Out of Food in the Last Year: Not on file  Transportation Needs:   . Lack of Transportation (Medical): Not on file  . Lack of Transportation (Non-Medical): Not on file  Physical Activity:   . Days of Exercise per Week: Not on file  . Minutes of Exercise per Session: Not on file  Stress:   . Feeling of Stress : Not on file  Social Connections:   . Frequency of Communication with Friends and Family: Not on file  . Frequency of Social Gatherings with Friends and Family: Not on file  . Attends Religious Services: Not on file  . Active Member of Clubs or Organizations: Not on file  . Attends Archivist Meetings: Not on file  . Marital Status: Not on file  Intimate Partner Violence:   . Fear of Current or Ex-Partner: Not on file  . Emotionally Abused: Not on file  . Physically Abused: Not on file  . Sexually Abused: Not on file    PHYSICAL EXAM: Height 5\' 11"  (1.803 m), weight 224 lb (101.6 kg). General: No acute distress.  Patient appears well-groomed.   Head:  Normocephalic/atraumatic Eyes:  Fundi examined but not visualized Neck: supple, no paraspinal tenderness, full range of motion Heart:  Regular rate and rhythm Lungs:  Clear to  auscultation bilaterally Back: No paraspinal tenderness Neurological Exam: alert and oriented to  person, place, and time. Attention span and concentration decreased, recent memory impaired, remote memory intact, fund of knowledge intact.  Speech fluent and not dysarthric, language intact.  Decreased left V2.  Otherwise, CN II-XII intact. Bulk and tone flaccid in left upper extremity.  Muscle strength plegic left upper extremity except 2+/5 hand grip, 2+/5 left hip flexion, plegic left knee flexion, left foot drop, left knee extension; 5/5 right upper and lower extremities.  Sensation to pinprick decreased in left lower extremity, decreased vibratory sensation bilateral lower extremities.  Deep tendon reflexes 3+ left upper and lower extremities with sustained clonus in left ankle, 2+ on right.  Finger to nose testing intact on right, unable to assess left.  Gait:  In wheelchair.  IMPRESSION: 1.  Hemiplegia as late effect of stroke, likely secondary to small vessel disease 2.  New onset headaches.  Semiology appears consistent with migraine without aura, without status migrainosus, not intractable.   3.  Right hand twitching.  Description consistent with myoclonus.  Unclear etiology.  May be pharmacologic effect due to Cymbalta and gabapentin, however no recent change in dosing.  Also consider simple partial seizures although not involving limb affected by stroke. 4.  Vascular mild neurocognitive disorder 5.  Hypertension 6.  Hyperlipidemia 7.  Type 2 diabetes mellitus 8.  Ischemic cardiomyopathy 9.  Generalized anxiety disorder and depression   PLAN: 1.  Given that he is having new worsening headaches and right hand myoclonus, will check MRI of brain without contrast. 2.  For headache prophylaxis, start zonisamide, titrating to 100mg  daily. 3.  Use Tylenol for abortive headache therapy.  Try not to use Percocet.  Limit use of pain relievers to no more than 2 days out of week to prevent risk of  rebound or medication-overuse headache. 4.  Routine EEG 5.  Secondary stroke prevention as managed by PCP:  1.  ASA 81mg  and Plavix 75mg  daily  2.  Atorvastatin (LDL goal less than 70)  3.  Smoking cessation  4.  Blood pressure control  5.  Glycemic control 6.  Psychiatric management 7.  Follow up in 5 months.   Metta Clines, DO  CC:  Gar Ponto, MD

## 2019-08-09 ENCOUNTER — Encounter: Payer: Self-pay | Admitting: Neurology

## 2019-08-09 ENCOUNTER — Other Ambulatory Visit: Payer: Self-pay

## 2019-08-09 ENCOUNTER — Ambulatory Visit (INDEPENDENT_AMBULATORY_CARE_PROVIDER_SITE_OTHER): Payer: Medicare HMO | Admitting: Neurology

## 2019-08-09 VITALS — Ht 71.0 in | Wt 224.0 lb

## 2019-08-09 DIAGNOSIS — F0151 Vascular dementia with behavioral disturbance: Secondary | ICD-10-CM

## 2019-08-09 DIAGNOSIS — G253 Myoclonus: Secondary | ICD-10-CM

## 2019-08-09 DIAGNOSIS — F0391 Unspecified dementia with behavioral disturbance: Secondary | ICD-10-CM

## 2019-08-09 DIAGNOSIS — I429 Cardiomyopathy, unspecified: Secondary | ICD-10-CM

## 2019-08-09 DIAGNOSIS — G43009 Migraine without aura, not intractable, without status migrainosus: Secondary | ICD-10-CM | POA: Diagnosis not present

## 2019-08-09 DIAGNOSIS — I69354 Hemiplegia and hemiparesis following cerebral infarction affecting left non-dominant side: Secondary | ICD-10-CM | POA: Diagnosis not present

## 2019-08-09 DIAGNOSIS — E118 Type 2 diabetes mellitus with unspecified complications: Secondary | ICD-10-CM

## 2019-08-09 DIAGNOSIS — Z794 Long term (current) use of insulin: Secondary | ICD-10-CM

## 2019-08-09 DIAGNOSIS — R519 Headache, unspecified: Secondary | ICD-10-CM

## 2019-08-09 DIAGNOSIS — I10 Essential (primary) hypertension: Secondary | ICD-10-CM

## 2019-08-09 MED ORDER — ZONISAMIDE 25 MG PO CAPS: ORAL_CAPSULE | ORAL | 0 refills | Status: DC

## 2019-08-09 NOTE — Patient Instructions (Addendum)
1.  Start zonisamide.  Titrate as directed. 2.  Check MRI of brain without contrast 3.  Check EEG 4.  Follow up in 5 months virtual visit is fine. EEG will be scheduled here at [facility]   We have sent a referral to [facility] for your MRI and they will call you directly to schedule your appointment. They are located at [place]. If you need to contact them directly please call [phone].

## 2019-08-10 ENCOUNTER — Ambulatory Visit
Admission: RE | Admit: 2019-08-10 | Discharge: 2019-08-10 | Disposition: A | Discharge status: Home / Self Care | Payer: Medicare HMO | Source: Ambulatory Visit | Attending: Family Medicine | Admitting: Family Medicine

## 2019-08-10 DIAGNOSIS — M25512 Pain in left shoulder: Secondary | ICD-10-CM

## 2019-08-13 ENCOUNTER — Ambulatory Visit: Payer: Self-pay | Admitting: Neurology

## 2019-08-17 ENCOUNTER — Ambulatory Visit (INDEPENDENT_AMBULATORY_CARE_PROVIDER_SITE_OTHER): Payer: Medicare HMO | Admitting: Neurology

## 2019-08-17 ENCOUNTER — Other Ambulatory Visit: Payer: Self-pay

## 2019-08-17 DIAGNOSIS — R519 Headache, unspecified: Secondary | ICD-10-CM

## 2019-08-17 DIAGNOSIS — G253 Myoclonus: Secondary | ICD-10-CM

## 2019-08-17 NOTE — Procedures (Signed)
ELECTROENCEPHALOGRAM REPORT  Date of Study: 08/17/2019  Patient's Name: [name] MRN: [id number] Date of Birth: 12-03-1965   Clinical History: 54 year old male with history of stroke and migraines presents for right hand twitching.  Medications: VITAMIN D 2000 units tablet PLAVIX 75 MG tablet CYMBALTA 30 MG capsule CYMBALTA 60 MG capsule ENTRESTO 49-51 MG LOFIBRA 134 MG capsule CUTIVATE 0.05 % cream LASIX 20 MG tablet NEURONTIN 600 MG tablet IMDUR 60 MG 24 hr tablet NITROSTAT 0.4 MG SL tablet OXY IR/ROXICODONE 5 MG immediate release tablet PROTONIX 40 MG tablet phentermine 37.5 MG capsule CYANOCOBALAMIN 1000 MCG tablet  Technical Summary: A multichannel digital EEG recording measured by the international 10-20 system with electrodes applied with paste and impedances below 5000 ohms performed in our laboratory with EKG monitoring in an awake and asleep patient.  Hyperventilation not performed due to patient wearing mask for Covid-19.  Photic stimulation was performed.  The digital EEG was referentially recorded, reformatted, and digitally filtered in a variety of bipolar and referential montages for optimal display.    Description: The patient is awake and asleep during the recording.  During maximal wakefulness, there is a symmetric, medium voltage 9 Hz posterior dominant rhythm that attenuates with eye opening.  The record is symmetric.  During drowsiness and sleep, there is an increase in theta slowing of the background.  Vertex waves and symmetric sleep spindles were seen.  Photic stimulation did not elicit any abnormalities.  There were no epileptiform discharges or electrographic seizures seen.    EKG lead was unremarkable.  Impression: This awake and asleep EEG is normal.    Clinical Correlation: A normal EEG does not exclude a clinical diagnosis of epilepsy.  If further clinical questions remain, prolonged EEG may be helpful.  Clinical correlation is  advised.   Metta Clines, DO

## 2019-08-20 ENCOUNTER — Encounter (HOSPITAL_COMMUNITY): Payer: Self-pay

## 2019-08-20 ENCOUNTER — Other Ambulatory Visit: Payer: Self-pay

## 2019-08-20 ENCOUNTER — Emergency Department (HOSPITAL_COMMUNITY)
Admission: EM | Admit: 2019-08-20 | Discharge: 2019-08-20 | Disposition: A | Discharge status: Home / Self Care | Payer: Medicare HMO | Attending: Emergency Medicine | Admitting: Emergency Medicine

## 2019-08-20 DIAGNOSIS — Z5321 Procedure and treatment not carried out due to patient leaving prior to being seen by health care provider: Secondary | ICD-10-CM | POA: Diagnosis not present

## 2019-08-20 DIAGNOSIS — R69 Illness, unspecified: Secondary | ICD-10-CM | POA: Insufficient documentation

## 2019-08-20 NOTE — ED Triage Notes (Signed)
Pt presents to ED because he went to his pain clinic today and they were unable to draw blood from him, told him he was severely dehydrated and he needed to be seen at the ED. Pt states he has had diarrhea on and off for about 1 month.

## 2019-08-24 ENCOUNTER — Encounter: Payer: Self-pay | Admitting: Cardiology

## 2019-09-13 ENCOUNTER — Other Ambulatory Visit: Payer: Self-pay

## 2019-09-13 ENCOUNTER — Ambulatory Visit
Admission: RE | Admit: 2019-09-13 | Discharge: 2019-09-13 | Disposition: A | Discharge status: Home / Self Care | Payer: Medicare HMO | Source: Ambulatory Visit | Attending: Neurology | Admitting: Neurology

## 2019-09-13 DIAGNOSIS — G253 Myoclonus: Secondary | ICD-10-CM

## 2019-09-13 DIAGNOSIS — R519 Headache, unspecified: Secondary | ICD-10-CM

## 2019-09-28 ENCOUNTER — Encounter: Payer: Self-pay | Admitting: Physical Medicine & Rehabilitation

## 2019-09-28 ENCOUNTER — Other Ambulatory Visit: Payer: Self-pay

## 2019-09-28 ENCOUNTER — Encounter: Payer: Medicare HMO | Attending: Physical Medicine & Rehabilitation | Admitting: Physical Medicine & Rehabilitation

## 2019-09-28 VITALS — BP 94/58 | HR 70 | Temp 97.5°F | Ht 71.0 in | Wt 224.0 lb

## 2019-09-28 DIAGNOSIS — F419 Anxiety disorder, unspecified: Secondary | ICD-10-CM | POA: Insufficient documentation

## 2019-09-28 DIAGNOSIS — G4733 Obstructive sleep apnea (adult) (pediatric): Secondary | ICD-10-CM | POA: Diagnosis not present

## 2019-09-28 DIAGNOSIS — G8110 Spastic hemiplegia affecting unspecified side: Secondary | ICD-10-CM

## 2019-09-28 DIAGNOSIS — I2510 Atherosclerotic heart disease of native coronary artery without angina pectoris: Secondary | ICD-10-CM | POA: Diagnosis not present

## 2019-09-28 DIAGNOSIS — M1990 Unspecified osteoarthritis, unspecified site: Secondary | ICD-10-CM | POA: Insufficient documentation

## 2019-09-28 DIAGNOSIS — I69354 Hemiplegia and hemiparesis following cerebral infarction affecting left non-dominant side: Secondary | ICD-10-CM | POA: Insufficient documentation

## 2019-09-28 DIAGNOSIS — I252 Old myocardial infarction: Secondary | ICD-10-CM | POA: Diagnosis not present

## 2019-09-28 DIAGNOSIS — I110 Hypertensive heart disease with heart failure: Secondary | ICD-10-CM | POA: Insufficient documentation

## 2019-09-28 DIAGNOSIS — E782 Mixed hyperlipidemia: Secondary | ICD-10-CM | POA: Insufficient documentation

## 2019-09-28 DIAGNOSIS — J449 Chronic obstructive pulmonary disease, unspecified: Secondary | ICD-10-CM | POA: Insufficient documentation

## 2019-09-28 DIAGNOSIS — F17210 Nicotine dependence, cigarettes, uncomplicated: Secondary | ICD-10-CM | POA: Diagnosis not present

## 2019-09-28 DIAGNOSIS — I509 Heart failure, unspecified: Secondary | ICD-10-CM | POA: Diagnosis not present

## 2019-09-28 DIAGNOSIS — E119 Type 2 diabetes mellitus without complications: Secondary | ICD-10-CM | POA: Diagnosis not present

## 2019-09-28 NOTE — Progress Notes (Signed)
Dysport Injection for spasticity using needle EMG guidance  Dilution: 200 Units/ml Indication: Severe spasticity which interferes with ADL,mobility and/or  hygiene and is unresponsive to medication management and other conservative care Pt indicates LUE spasticity in shoulder and arm is main issue, hamstring spasms are not bothering him Informed consent was obtained after describing risks and benefits of the procedure with the patient. This includes bleeding, bruising, infection, excessive weakness, or medication side effects. A REMS form is on file and signed. Needle:  needle electrode Number of units per muscle LUE FDS 100 FDP 100 FPL 100 Biceps 200 Brachiorad 100 0-1+ reduce to 0 U  Trunk Left pec 300, 0-1+   May reduce to 100U   LLE  Med gastroc 300  Tibialis post 0-1 may reduce to 100U All injections were done after obtaining appropriate EMG activity and after negative drawback for blood. The patient tolerated the procedure well. Post procedure instructions were given. A followup appointment was made.

## 2019-09-28 NOTE — Patient Instructions (Signed)

## 2019-11-05 ENCOUNTER — Ambulatory Visit (INDEPENDENT_AMBULATORY_CARE_PROVIDER_SITE_OTHER): Payer: Medicare HMO | Admitting: Cardiology

## 2019-11-05 ENCOUNTER — Encounter: Payer: Self-pay | Admitting: *Deleted

## 2019-11-05 ENCOUNTER — Other Ambulatory Visit: Payer: Self-pay

## 2019-11-05 ENCOUNTER — Ambulatory Visit: Payer: Medicare HMO | Admitting: Cardiology

## 2019-11-05 ENCOUNTER — Encounter: Payer: Self-pay | Admitting: Cardiology

## 2019-11-05 VITALS — BP 112/54 | HR 75 | Ht 71.0 in | Wt 228.0 lb

## 2019-11-05 DIAGNOSIS — E782 Mixed hyperlipidemia: Secondary | ICD-10-CM

## 2019-11-05 DIAGNOSIS — I25119 Atherosclerotic heart disease of native coronary artery with unspecified angina pectoris: Secondary | ICD-10-CM | POA: Diagnosis not present

## 2019-11-05 DIAGNOSIS — I255 Ischemic cardiomyopathy: Secondary | ICD-10-CM | POA: Diagnosis not present

## 2019-11-05 NOTE — Progress Notes (Signed)
Cardiology Office Note  Date: 11/05/2019   ID: [name], DOB 1966-02-20, MRN [id number]  PCP:  Caryl Bis, MD  Cardiologist:  Rozann Lesches, MD Electrophysiologist:  None   Chief Complaint  Patient presents with  . Cardiac follow-up    History of Present Illness: [name] is a 54 y.o. male last assessed via telehealth encounter in January.  He is here today with an assistant for a follow-up visit.  Reports having COVID-19 earlier in the year, lost at least 15 pounds, but weight is back to baseline now.  Still has chronic left leg swelling, side affected by stroke.  No orthopnea or PND.  He has had some chest congestion recently and has been using Mucinex.  I reviewed his cardiac regimen which is stable and outlined below.  No increasing nitroglycerin requirement.  He is otherwise tolerating his medications, blood pressure is well controlled today.  Reviewed his ECG which shows sinus rhythm with low voltage, probable old anteroseptal infarct pattern, nonspecific ST-T changes.  He reports having recent lab work with Dr. Quillian Quince, results to be requested.  Past Medical History:  Diagnosis Date  . Anaphylaxis    IGE mediated  . Anxiety   . Arthritis   . Cardiomyopathy ([place])   . CHF (congestive heart failure) ([place])   . COPD (chronic obstructive pulmonary disease) ([place])   . Coronary atherosclerosis of native coronary artery    Multivessel status post CABG 2010  . Depression   . Essential hypertension   . History of CVA (cerebrovascular accident) 01/2012   Right posterior frontal cortical and subcortical brain by MRI, no hemorrhage. Carotid Dopplers showed only 1-50% bilateral ICA stenoses. Echocardiogram showed LVEF 50%, no major valvular abnormalities.  . Mixed hyperlipidemia   . Myocardial infarction ([place]) 2010  . OSA (obstructive sleep apnea)   . Stroke ([place])   . Type 2 diabetes mellitus ([place])     Past Surgical History:  Procedure Laterality  Date  . CHOLECYSTECTOMY    . CORONARY ARTERY BYPASS GRAFT  2010   LIMA to LAD, SVG to diagonal, SVG to OM1 and OM 2, SVG to RCA  . DENTAL SURGERY  2003  . GASTRIC BYPASS  2010  . HERNIA REPAIR  2011, 2012  . INCISIONAL HERNIA REPAIR N/A 07/23/2013   Procedure: HERNIA REPAIR INCISIONAL WITH MESH;  Surgeon: Jamesetta So, MD;  Location: [facility];  Service: General;  Laterality: N/A;  . INSERTION OF MESH N/A 07/23/2013   Procedure: INSERTION OF MESH;  Surgeon: Jamesetta So, MD;  Location: [facility];  Service: General;  Laterality: N/A;  . LEFT HEART CATHETERIZATION WITH CORONARY/GRAFT ANGIOGRAM N/A 11/01/2013   Procedure: LEFT HEART CATHETERIZATION WITH Beatrix Fetters;  Surgeon: Blane Ohara, MD;  Location[facility] CATH LAB;  Service: Cardiovascular;  Laterality: N/A;  . TOE AMPUTATION  1998   right 1st and 2nd toe  . TONSILECTOMY, ADENOIDECTOMY, BILATERAL MYRINGOTOMY AND TUBES    . TONSILLECTOMY    . VENTRAL HERNIA REPAIR N/A 10/28/2012   Procedure: LAPAROSCOPIC VENTRAL HERNIA;  Surgeon: Donato Heinz, MD;  Location: [facility];  Service: General;  Laterality: N/A;    Current Outpatient Medications  Medication Sig Dispense Refill  . albuterol (PROVENTIL HFA;VENTOLIN HFA) 108 (90 BASE) MCG/ACT inhaler Inhale 2 puffs into the lungs every 6 (six) hours as needed for wheezing or shortness of breath.    . ALPRAZolam (XANAX) 1 MG tablet Take 1 tablet (1 mg total) by mouth  daily as needed for anxiety. 30 tablet 2  . aspirin EC 81 MG tablet Take 1 tablet (81 mg total) by mouth daily. 90 tablet 3  . atorvastatin (LIPITOR) 40 MG tablet Take 1 tablet by mouth daily.    . baclofen (LIORESAL) 10 MG tablet Take 1 tablet (10 mg total) by mouth 3 (three) times daily as needed for muscle spasms. 90 each 1  . carvedilol (COREG) 6.25 MG tablet Take 6.25 mg by mouth 2 (two) times daily with a meal.    . Cholecalciferol (VITAMIN D) 2000 units tablet Take 2,000 Units by mouth daily at 12 noon.    . clopidogrel  (PLAVIX) 75 MG tablet Take 1 tablet by mouth daily.    . DULoxetine (CYMBALTA) 30 MG capsule Take 90 mg daily (60 mg + 30 mg) 90 capsule 0  . DULoxetine (CYMBALTA) 60 MG capsule Take 90 mg daily (60 mg + 30 mg) 90 capsule 0  . ENTRESTO 49-51 MG TAKE (1) TABLET TWICE DAILY. 60 tablet 6  . fenofibrate micronized (LOFIBRA) 134 MG capsule Take 134 mg by mouth daily before breakfast.     . fluticasone (CUTIVATE) 0.05 % cream Apply 1 application topically 2 (two) times daily.    . furosemide (LASIX) 20 MG tablet Take 20 mg by mouth daily.     [name] gabapentin (NEURONTIN) 600 MG tablet Take 1 tablet by mouth 2 (two) times daily.     . isosorbide mononitrate (IMDUR) 60 MG 24 hr tablet Take 60 mg by mouth 2 (two) times daily.     . nitroGLYCERIN (NITROSTAT) 0.4 MG SL tablet Place 0.4 mg under the tongue every 5 (five) minutes as needed. For chest pain    . oxyCODONE (OXY IR/ROXICODONE) 5 MG immediate release tablet Take 5 mg by mouth every 6 (six) hours as needed for severe pain.      No current facility-administered medications for this visit.   Allergies:  Contrast media [iodinated diagnostic agents], Ibuprofen, and Nsaids   ROS:  No palpitations or syncope.  Physical Exam: VS:  BP (!) 112/54   Pulse 75   Ht 5\' 11"  (1.803 m)   Wt 228 lb (103.4 kg)   SpO2 98%   BMI 31.80 kg/m , BMI Body mass index is 31.8 kg/m.  Wt Readings from Last 3 Encounters:  11/05/19 228 lb (103.4 kg)  09/28/19 224 lb (101.6 kg)  08/20/19 216 lb (98 kg)    General: Patient appears comfortable at rest.  Seated in wheelchair. HEENT: Conjunctiva and lids normal, wearing a mask. Neck: Supple, no elevated JVP or carotid bruits, no thyromegaly. Lungs: Clear to auscultation, nonlabored breathing at rest. Cardiac: Regular rate and rhythm, no S3, soft systolic murmur, no pericardial rub. Abdomen: Soft, bowel sounds present. Extremities: Comparing left leg edema and erythema, brace in place. Neuropsychiatric: Alert and  oriented x3, affect grossly appropriate.  Chronic left-sided hemiparesis  ECG:  An ECG dated 07/01/2018 was personally reviewed today and demonstrated:  Sinus rhythm with old anterior infarct pattern, nonspecific ST-T changes.  Recent Labwork:  April 2020: TSH 3.04, cholesterol 147, triglycerides 227, HDL 20, LDL 82, BUN 21, creatinine 1.4, potassium 4.3, AST 14, ALT 20  Other Studies Reviewed Today:  Lexiscan Myoview 06/18/2017([facility]): FINDINGS: Large fixed defect is present involving the apex and large portion of the periapical anterior wall. No evidence of ischemia. Wall motion analysis reveals apical dyskinesis and akinesis. Left ventricular ejection fraction is calculated to be 18%.   Echocardiogram 06/30/2019: 1.  Left ventricular ejection fraction, by visual estimation, is 40-50%. The left ventricle has Low normal to mildly decreased function. There is no left ventricular hypertrophy. 2. Left ventricular diastolic parameters are indeterminate. 3. Very difficult visualization of the LV, difficult interpretation of LVEF and wall motion. Roughly estimated LVEF is in the 40-50% range. There is probable hypokinesis of the anteroseptal wall and apex. Recommend contrast study for better evaluation. 4. Global right ventricle was not well visualized.The right ventricular size is not well visualized. Right vetricular wall thickness was not assessed. 5. Left atrial size was not well visualized. 6. Right atrial size was not well visualized. 7. The pericardium was not well visualized. 8. The mitral valve was not well visualized. No evidence of mitral valve regurgitation. No evidence of mitral stenosis. 9. The tricuspid valve is not well visualized. Tricuspid valve regurgitation is not demonstrated. 10. The aortic valve was not well visualized. Aortic valve regurgitation is mild. No evidence of aortic valve sclerosis or stenosis. 11. The pulmonic valve was not well visualized. Pulmonic  valve regurgitation is not visualized. 12. The interatrial septum was not well visualized.  Assessment and Plan:  1.  Multivessel CAD status post CABG, patent bypass grafts in 2015, and follow-up Myoview in 2018 showing large infarct scar without active ischemia.  He is doing well without recurring angina at this time.  ECG reviewed.  Aspirin, Plavix, Lipitor, Coreg and Imdur.  2.  Ischemic cardiomyopathy, LVEF improved to the range of 40 to 50% by echocardiogram in December 2020.  Fluid status looks controlled today.  Continue Coreg, Entresto, and Lasix.  3.  Mixed hyperlipidemia, continues on Lipitor.  Requesting recent lab work from PCP.  Medication Adjustments/Labs and Tests Ordered: Current medicines are reviewed at length with the patient today.  Concerns regarding medicines are outlined above.   Tests Ordered: Orders Placed This Encounter  Procedures  . EKG 12-Lead    Medication Changes: No orders of the defined types were placed in this encounter.   Disposition:  Follow up 6 months in the [place] office.  Signed, Satira Sark, MD, [facility] 11/05/2019 11:27 AM    [facility] [place], [place], [place] [address] Phone: [phone]; Fax: [phone]

## 2019-11-05 NOTE — Patient Instructions (Addendum)
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
## 2019-11-29 ENCOUNTER — Telehealth (INDEPENDENT_AMBULATORY_CARE_PROVIDER_SITE_OTHER): Payer: Medicare HMO | Admitting: Psychiatry

## 2019-11-29 ENCOUNTER — Encounter (HOSPITAL_COMMUNITY): Payer: Self-pay | Admitting: Psychiatry

## 2019-11-29 ENCOUNTER — Other Ambulatory Visit: Payer: Self-pay

## 2019-11-29 DIAGNOSIS — F3341 Major depressive disorder, recurrent, in partial remission: Secondary | ICD-10-CM | POA: Diagnosis not present

## 2019-11-29 DIAGNOSIS — F419 Anxiety disorder, unspecified: Secondary | ICD-10-CM

## 2019-11-29 DIAGNOSIS — G4700 Insomnia, unspecified: Secondary | ICD-10-CM | POA: Diagnosis not present

## 2019-11-29 MED ORDER — DULOXETINE HCL 30 MG PO CPEP: ORAL_CAPSULE | ORAL | 0 refills | Status: DC

## 2019-11-29 MED ORDER — TRAZODONE HCL 50 MG PO TABS: ORAL_TABLET | ORAL | 2 refills | Status: DC

## 2019-11-29 MED ORDER — ALPRAZOLAM 1 MG PO TABS: 1.0000 mg | ORAL_TABLET | Freq: Every day | ORAL | 2 refills | Status: DC | PRN

## 2019-11-29 MED ORDER — DULOXETINE HCL 60 MG PO CPEP: ORAL_CAPSULE | ORAL | 0 refills | Status: DC

## 2019-12-30 ENCOUNTER — Encounter: Payer: Self-pay | Admitting: Physical Medicine & Rehabilitation

## 2019-12-30 ENCOUNTER — Encounter: Payer: Medicare HMO | Attending: Physical Medicine & Rehabilitation | Admitting: Physical Medicine & Rehabilitation

## 2019-12-30 ENCOUNTER — Other Ambulatory Visit: Payer: Self-pay

## 2019-12-30 VITALS — BP 104/65 | HR 78 | Temp 97.6°F | Ht 71.0 in | Wt 240.0 lb

## 2019-12-30 DIAGNOSIS — I69354 Hemiplegia and hemiparesis following cerebral infarction affecting left non-dominant side: Secondary | ICD-10-CM | POA: Diagnosis not present

## 2019-12-30 DIAGNOSIS — F419 Anxiety disorder, unspecified: Secondary | ICD-10-CM | POA: Diagnosis not present

## 2019-12-30 DIAGNOSIS — G8110 Spastic hemiplegia affecting unspecified side: Secondary | ICD-10-CM | POA: Diagnosis present

## 2019-12-30 DIAGNOSIS — G8114 Spastic hemiplegia affecting left nondominant side: Secondary | ICD-10-CM | POA: Diagnosis not present

## 2019-12-30 DIAGNOSIS — I110 Hypertensive heart disease with heart failure: Secondary | ICD-10-CM | POA: Insufficient documentation

## 2019-12-30 DIAGNOSIS — I509 Heart failure, unspecified: Secondary | ICD-10-CM | POA: Insufficient documentation

## 2019-12-30 DIAGNOSIS — G4733 Obstructive sleep apnea (adult) (pediatric): Secondary | ICD-10-CM | POA: Diagnosis not present

## 2019-12-30 DIAGNOSIS — E119 Type 2 diabetes mellitus without complications: Secondary | ICD-10-CM | POA: Diagnosis not present

## 2019-12-30 DIAGNOSIS — E782 Mixed hyperlipidemia: Secondary | ICD-10-CM | POA: Diagnosis not present

## 2019-12-30 DIAGNOSIS — I252 Old myocardial infarction: Secondary | ICD-10-CM | POA: Diagnosis not present

## 2019-12-30 DIAGNOSIS — M1990 Unspecified osteoarthritis, unspecified site: Secondary | ICD-10-CM | POA: Diagnosis not present

## 2019-12-30 DIAGNOSIS — F17210 Nicotine dependence, cigarettes, uncomplicated: Secondary | ICD-10-CM | POA: Insufficient documentation

## 2019-12-30 DIAGNOSIS — J449 Chronic obstructive pulmonary disease, unspecified: Secondary | ICD-10-CM | POA: Diagnosis not present

## 2019-12-30 DIAGNOSIS — I2510 Atherosclerotic heart disease of native coronary artery without angina pectoris: Secondary | ICD-10-CM | POA: Insufficient documentation

## 2019-12-30 MED ORDER — BACLOFEN 20 MG PO TABS: 20.0000 mg | ORAL_TABLET | Freq: Three times a day (TID) | ORAL | 5 refills | Status: DC

## 2019-12-30 NOTE — Progress Notes (Addendum)
Dysport Injection for spasticity using needle EMG guidance  Dilution: 200 Units/ml Indication: Severe spasticity which interferes with ADL,mobility and/or  hygiene and is unresponsive to medication management and other conservative care Pt indicates LUE spasticity in shoulder and arm is main issue, hamstring spasms are not bothering him Informed consent was obtained after describing risks and benefits of the procedure with the patient. This includes bleeding, bruising, infection, excessive weakness, or medication side effects. A REMS form is on file and signed. Needle:  needle electrode Number of units per muscle LUE FDS 100 FDP 200  Biceps 200   Trunk Left pec  100U   LLE  Med gastroc 300  Tibialis post  100U All injections were done after obtaining appropriate EMG activity and after negative drawback for blood. The patient tolerated the procedure well. Post procedure instructions were given. A followup appointment was made.  Despite Botox and baclofen 10 mg 3 times daily patient still having some spasms and tightness in his arm.  Will increase baclofen to 20 mg 3 times daily

## 2020-01-06 ENCOUNTER — Encounter: Payer: Self-pay | Admitting: Neurology

## 2020-01-06 NOTE — Progress Notes (Signed)
Virtual Visit via Video Note The purpose of this virtual visit is to provide medical care while limiting exposure to the novel coronavirus.    Consent was obtained for video visit:  Yes.   Answered questions that patient had about telehealth interaction:  Yes.   I discussed the limitations, risks, security and privacy concerns of performing an evaluation and management service by telemedicine. I also discussed with the patient that there may be a patient responsible charge related to this service. The patient expressed understanding and agreed to proceed.  Pt location: Home Physician Location: office Name of referring provider:  Caryl Bis, MD I connected with [name] at patients initiation/request on 01/07/2020 at 10:50 AM EDT by video enabled telemedicine application and verified that I am speaking with the correct person using two identifiers. Pt MRN:  [id number] Pt DOB:  12-02-1965 Video Participants:  [name]   History of Present Illness:  [name] is a 54 year old right-handed man with hypertension, type 2 diabetes mellitus, CAD, ischemic cardiomyopathy, COPD, tobacco use disorder, depression and history of prior strokes who follows up for migraines   UPDATE: Current medications: ASA 81mg , Plavix 75mg , atorvastatin 80mg , Coreg, Imdur, Lasix, alprazolam 1mg  PRN, baclofen 10mg  TID PRN, Cymbalta 90mg  daily, gabapentin 600mg  TID  He was prescribed zonisamide in January for headaches because he was scared to start it, but headaches are improved.  Mild headaches occur 2 to 3 times a week, lasting 30 minutes each;  but he has one severe headache a month.    Due to worsening headache, MRI of brain without contrast was performed on 09/13/2019, which was personally reviewed, and demonstrated stable advanced chronic small vessel ischemic changes but no acute intracranial abnormality. Due to right hand myoclonus, EEG was performed on 08/17/2019, which was  normal.  Recent Hgb A1c last week was 5.5.  LDL from February was 78.  HISTORY: He was admitted to [facility] on 06/14/17 with sudden onset left sided weakness and numbness and difficulty speaking. He initially presented to [facility] where he received tPA with NIHSS of 15 and was then transferred to [facility]. CT of head from [facility] reportedly showed "acute infarction involving portions of the posterior right basal ganglia, right corona radiata and body of the right caudate nucleus". MRI of brain at [facility] confirmed acute right basal ganglia infarct as well as remote right frontal infarct. MRA of head and carotid doppler revealed no significant intracranial or extracranial arterial stenosis or occlusion. He was unable to have CTA due to allergy to iodinated contrast. Echocardiogram with bubble study showed EF of 25-30% with no evidence of PFO or ASD. He was evaluated by cardiology for ischemic cardiomyopathy. Lexiscan nuclear perfusion study revealed EF 18% with large infarct involving the apex and periapical anterior wall with no evidence of ischemia. He was advised to follow up with outpatient cardiology. LDL was 123 and triglycerides were 237. Hgb A1c was 5.7. He was already taking ASA 325mg  but not daily, as well as Plavix. He is continued on ASA and Plavix.  06/30/2019 Echocardiogram:  LV EF 40-50%  He was seen in the ED at [facility] on 09/22/17 for headache. CT of head showed no acute findings but was read as demonstrating a chronic appearing infarct in the right posterior limb of internal capsule that was not present on prior CT from 06/14/17. He also complained of left leg pain. He reportedly had an elevated d dimer. CT chest and  venous doppler of lower extremity were negative for DVT. However, the started him on Xarelto. He was told to stop Plavix by his PCP. I contacted his PCP, Dr. Quillian Quince, who reviewed the ED notes and is also  unsure why they started Xarelto when imaging was negative for DVT.He was advised to discontinue Xarelto and restart Plavix with aspirin.  Headaches returned in December 2020.  Right sided 10/10 throbbing/pressure pain in the temple radiating to back of head.  Associated nausea, blurred vision, photophobia.  They last 2 hours and occur 2 to 3 days a week.  He takes Tylenol.  For the past month, he also notes that his right hand twitches about every other day, lasting 20-30 minutes.  It may occur while holding a cup but also at rest.  No recent change or added medications.  He reported cough and loss of taste.  He was concerned about Covid-19 but taste returned and cough resolved after 1 to 2 days.  He had neuropsychological testing performed on 03/31/18, which demonstrated vascular cognitive impairment affecting psychomotor processing speed, attention/working memory, and executive functioning.He did go to PT and speech therapy.   He sees Dr. Letta Pate for Dysport injection to treat spasticity. He reports panic attacks. He sees Dr. Toy Care for depression and anxiety.  Past Medical History: Past Medical History:  Diagnosis Date  . Anaphylaxis    IGE mediated  . Anxiety   . Arthritis   . Cardiomyopathy ([place])   . CHF (congestive heart failure) ([place])   . COPD (chronic obstructive pulmonary disease) ([place])   . Coronary atherosclerosis of native coronary artery    Multivessel status post CABG 2010  . Depression   . Essential hypertension   . History of CVA (cerebrovascular accident) 01/2012   Right posterior frontal cortical and subcortical brain by MRI, no hemorrhage. Carotid Dopplers showed only 1-50% bilateral ICA stenoses. Echocardiogram showed LVEF 50%, no major valvular abnormalities.  . Mixed hyperlipidemia   . Myocardial infarction ([place]) 2010  . OSA (obstructive sleep apnea)   . Stroke ([place])   . Type 2 diabetes mellitus (H[facility])     Medications: Outpatient Encounter Medications as of  01/07/2020  Medication Sig Note  . albuterol (PROVENTIL HFA;VENTOLIN HFA) 108 (90 BASE) MCG/ACT inhaler Inhale 2 puffs into the lungs every 6 (six) hours as needed for wheezing or shortness of breath.   . ALPRAZolam (XANAX) 1 MG tablet Take 1 tablet (1 mg total) by mouth daily as needed for anxiety.   [name] aspirin EC 81 MG tablet Take 1 tablet (81 mg total) by mouth daily.   [name] atorvastatin (LIPITOR) 40 MG tablet Take 1 tablet by mouth daily.   . baclofen (LIORESAL) 20 MG tablet Take 1 tablet (20 mg total) by mouth 3 (three) times daily.   . carvedilol (COREG) 6.25 MG tablet Take 6.25 mg by mouth 2 (two) times daily with a meal.   . Cholecalciferol (VITAMIN D) 2000 units tablet Take 2,000 Units by mouth daily at 12 noon.   . clopidogrel (PLAVIX) 75 MG tablet Take 1 tablet by mouth daily.   . DULoxetine (CYMBALTA) 30 MG capsule Take 90 mg daily (60 mg + 30 mg)   . DULoxetine (CYMBALTA) 60 MG capsule Take 90 mg daily (60 mg + 30 mg)   . ENTRESTO 49-51 MG TAKE (1) TABLET TWICE DAILY.   . fenofibrate micronized (LOFIBRA) 134 MG capsule Take 134 mg by mouth daily before breakfast.  07/28/2017: Has not started  . fluticasone (CUTIVATE)  0.05 % cream Apply 1 application topically 2 (two) times daily.   . furosemide (LASIX) 20 MG tablet Take 20 mg by mouth daily.    [name] gabapentin (NEURONTIN) 600 MG tablet Take 1 tablet by mouth 2 (two) times daily.    . isosorbide mononitrate (IMDUR) 60 MG 24 hr tablet Take 60 mg by mouth 2 (two) times daily.    . nitroGLYCERIN (NITROSTAT) 0.4 MG SL tablet Place 0.4 mg under the tongue every 5 (five) minutes as needed. For chest pain   . oxyCODONE (OXY IR/ROXICODONE) 5 MG immediate release tablet Take 5 mg by mouth every 6 (six) hours as needed for severe pain.  11/06/2018: Not prescribed by [facility]  . traZODone (DESYREL) 50 MG tablet 25-50 mg at night as needed for sleep    No facility-administered encounter medications on file as of 01/07/2020.    Allergies: Allergies    Allergen Reactions  . Contrast Media [Iodinated Diagnostic Agents] Anaphylaxis, Shortness Of Breath, Swelling and Rash    Isovue contrast is most acceptable agent based on previous experience and testing with premedications  . Ibuprofen Anaphylaxis, Hives and Swelling  . Nsaids Anaphylaxis, Hives, Swelling and Rash    Can take Aspirin 325 mg or lower    Family History: Family History  Problem Relation Age of Onset  . Lung cancer Father   . Heart disease Father   . Heart disease Mother   . Congestive Heart Failure Mother   . Diabetes Mother   . Hyperlipidemia Mother   . Alcohol abuse Brother   . Breast cancer Maternal Aunt   . Suicidality Cousin     Social History: Social History   Socioeconomic History  . Marital status: Legally Separated    Spouse name: Jolayne Haines  . Number of children: 2  . Years of education: Not on file  . Highest education level: 12th grade  Occupational History  . Occupation: disabled  Tobacco Use  . Smoking status: Current Every Day Smoker    Packs/day: 1.50    Years: 30.00    Pack years: 45.00    Types: Cigarettes    Start date: 06/08/1982  . Smokeless tobacco: Never Used  . Tobacco comment: 1 pack per day 03/04/2018  Vaping Use  . Vaping Use: Never used  Substance and Sexual Activity  . Alcohol use: No  . Drug use: No  . Sexual activity: Yes    Birth control/protection: None  Other Topics Concern  . Not on file  Social History Narrative   Disabled since age 58 lives with wife and children      Patient is right-handed. He is married, lives in a 1 story house, has a ramp to enter. Drinks 64oz of Mtn. Dew a day. Prior to CVA was walking daily. 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   Social Determinants of Health   Financial Resource Strain:   . Difficulty of Paying Living Expenses:   Food Insecurity:   . Worried About Charity fundraiser in the Last Year:   . Arboriculturist in the Last Year:   Transportation Needs:    . Film/video editor (Medical):   [name] Lack of Transportation (Non-Medical):   Physical Activity:   . Days of Exercise per Week:   . Minutes of Exercise per Session:   Stress:   . Feeling of Stress :   Social Connections:   . Frequency of Communication with Friends and Family:   .  Frequency of Social Gatherings with Friends and Family:   . Attends Religious Services:   . Active Member of Clubs or Organizations:   . Attends Archivist Meetings:   [name] Marital Status:   Intimate Partner Violence:   . Fear of Current or Ex-Partner:   . Emotionally Abused:   [name] Physically Abused:   . Sexually Abused:     Observations/Objective:   Height 5\' 11"  (1.803 m), weight 240 lb (108.9 kg).   Assessment and Plan:   1.  Hemiplegia as late effect of stroke, likely secondary to small vessel disease. 2.  Migraine without aura, without status migrainosus, not intractable, improved 3.  Right hand myoclonus.  Workup negative.  Not recent.   4.  Vascular mild neurocognitive disorder 5.  Hypertension 6.  Hyperlipidemia 7.  Type 2 diabetes mellitus 8.  Ischemic cardiomyopathy 9.  Generalized anxiety disorder and depression.  1.  Continue secondary stroke prevention as per PCP:  - ASA and Plavix   - Lipitor 40mg  (LDL goal less than 70)  - Glycemic control (Hgb A1c goal less than 7)  -  Blood pressure control (BP goal less than 130/90) 2.  Follow up in 6 months  Follow Up Instructions:    -I discussed the assessment and treatment plan with the patient. The patient was provided an opportunity to ask questions and all were answered. The patient agreed with the plan and demonstrated an understanding of the instructions.   The patient was advised to call back or seek an in-person evaluation if the symptoms worsen or if the condition fails to improve as anticipated.    Dudley Major, DO

## 2020-01-07 ENCOUNTER — Telehealth (INDEPENDENT_AMBULATORY_CARE_PROVIDER_SITE_OTHER): Payer: Medicare HMO | Admitting: Neurology

## 2020-01-07 ENCOUNTER — Other Ambulatory Visit: Payer: Self-pay

## 2020-01-07 VITALS — Ht 71.0 in | Wt 240.0 lb

## 2020-01-07 DIAGNOSIS — F0391 Unspecified dementia with behavioral disturbance: Secondary | ICD-10-CM

## 2020-01-07 DIAGNOSIS — I10 Essential (primary) hypertension: Secondary | ICD-10-CM

## 2020-01-07 DIAGNOSIS — E118 Type 2 diabetes mellitus with unspecified complications: Secondary | ICD-10-CM

## 2020-01-07 DIAGNOSIS — G253 Myoclonus: Secondary | ICD-10-CM | POA: Diagnosis not present

## 2020-01-07 DIAGNOSIS — I69354 Hemiplegia and hemiparesis following cerebral infarction affecting left non-dominant side: Secondary | ICD-10-CM

## 2020-01-07 DIAGNOSIS — G43009 Migraine without aura, not intractable, without status migrainosus: Secondary | ICD-10-CM | POA: Diagnosis not present

## 2020-01-07 DIAGNOSIS — E785 Hyperlipidemia, unspecified: Secondary | ICD-10-CM

## 2020-01-07 DIAGNOSIS — Z794 Long term (current) use of insulin: Secondary | ICD-10-CM

## 2020-01-07 DIAGNOSIS — I429 Cardiomyopathy, unspecified: Secondary | ICD-10-CM

## 2020-01-07 DIAGNOSIS — F0151 Vascular dementia with behavioral disturbance: Secondary | ICD-10-CM

## 2020-01-12 ENCOUNTER — Other Ambulatory Visit: Payer: Self-pay | Admitting: Psychiatry

## 2020-01-12 DIAGNOSIS — F3341 Major depressive disorder, recurrent, in partial remission: Secondary | ICD-10-CM
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
## 2020-02-28 ENCOUNTER — Other Ambulatory Visit: Payer: Self-pay

## 2020-02-28 ENCOUNTER — Telehealth (HOSPITAL_COMMUNITY): Payer: Self-pay | Admitting: Psychiatry

## 2020-02-28 ENCOUNTER — Telehealth (HOSPITAL_COMMUNITY): Payer: Medicare HMO | Admitting: Psychiatry

## 2020-02-28 NOTE — Telephone Encounter (Signed)
Sent link for video visit through Epic. Patient did not sign in. Called the patient  for appointment scheduled today. The patient did not answer the phone. Left voice message to contact the office.  
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
## 2020-03-09 ENCOUNTER — Telehealth (HOSPITAL_COMMUNITY): Payer: Self-pay | Admitting: Psychiatry

## 2020-03-09 ENCOUNTER — Other Ambulatory Visit: Payer: Self-pay

## 2020-03-09 ENCOUNTER — Telehealth (HOSPITAL_COMMUNITY): Payer: Medicare HMO | Admitting: Psychiatry

## 2020-03-09 NOTE — Telephone Encounter (Signed)
Sent link for video visit through Epic. Patient did not sign in. Called the patient  for appointment scheduled today. The patient did not answer the phone. Left voice message to contact the office.  
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
## 2020-03-14 ENCOUNTER — Other Ambulatory Visit: Payer: Self-pay

## 2020-03-14 ENCOUNTER — Other Ambulatory Visit: Payer: Self-pay | Admitting: Cardiology

## 2020-03-14 ENCOUNTER — Telehealth (INDEPENDENT_AMBULATORY_CARE_PROVIDER_SITE_OTHER): Payer: Medicare HMO | Admitting: Psychiatry

## 2020-03-14 ENCOUNTER — Encounter (HOSPITAL_COMMUNITY): Payer: Self-pay | Admitting: Psychiatry

## 2020-03-14 DIAGNOSIS — F3342 Major depressive disorder, recurrent, in full remission: Secondary | ICD-10-CM

## 2020-03-14 DIAGNOSIS — F419 Anxiety disorder, unspecified: Secondary | ICD-10-CM | POA: Diagnosis not present

## 2020-03-14 MED ORDER — ALPRAZOLAM 1 MG PO TABS: ORAL_TABLET | ORAL | 2 refills | Status: DC

## 2020-03-14 MED ORDER — DULOXETINE HCL 30 MG PO CPEP: ORAL_CAPSULE | ORAL | 0 refills | Status: DC

## 2020-03-14 MED ORDER — DULOXETINE HCL 60 MG PO CPEP: ORAL_CAPSULE | ORAL | 0 refills | Status: DC

## 2020-03-14 NOTE — Patient Instructions (Signed)
1.Continueduloxetine 90 mg(60 mg + 30 mg)daily  2. Decrease Xanax 0.5-1 mg daily as needed for anxiety 3. Discontinue Trazodone 4.Next appointment:11/23 at 9:10

## 2020-03-31 ENCOUNTER — Encounter: Payer: Medicare HMO | Attending: Physical Medicine & Rehabilitation | Admitting: Physical Medicine & Rehabilitation

## 2020-03-31 DIAGNOSIS — F17210 Nicotine dependence, cigarettes, uncomplicated: Secondary | ICD-10-CM | POA: Insufficient documentation

## 2020-03-31 DIAGNOSIS — E119 Type 2 diabetes mellitus without complications: Secondary | ICD-10-CM | POA: Insufficient documentation

## 2020-03-31 DIAGNOSIS — I2510 Atherosclerotic heart disease of native coronary artery without angina pectoris: Secondary | ICD-10-CM | POA: Insufficient documentation

## 2020-03-31 DIAGNOSIS — M1990 Unspecified osteoarthritis, unspecified site: Secondary | ICD-10-CM | POA: Insufficient documentation

## 2020-03-31 DIAGNOSIS — G4733 Obstructive sleep apnea (adult) (pediatric): Secondary | ICD-10-CM | POA: Insufficient documentation

## 2020-03-31 DIAGNOSIS — I509 Heart failure, unspecified: Secondary | ICD-10-CM | POA: Insufficient documentation

## 2020-03-31 DIAGNOSIS — I252 Old myocardial infarction: Secondary | ICD-10-CM | POA: Insufficient documentation

## 2020-03-31 DIAGNOSIS — F419 Anxiety disorder, unspecified: Secondary | ICD-10-CM | POA: Insufficient documentation

## 2020-03-31 DIAGNOSIS — E782 Mixed hyperlipidemia: Secondary | ICD-10-CM | POA: Insufficient documentation

## 2020-03-31 DIAGNOSIS — I110 Hypertensive heart disease with heart failure: Secondary | ICD-10-CM | POA: Insufficient documentation

## 2020-03-31 DIAGNOSIS — J449 Chronic obstructive pulmonary disease, unspecified: Secondary | ICD-10-CM | POA: Insufficient documentation

## 2020-03-31 DIAGNOSIS — I69354 Hemiplegia and hemiparesis following cerebral infarction affecting left non-dominant side: Secondary | ICD-10-CM | POA: Insufficient documentation

## 2020-04-06 ENCOUNTER — Other Ambulatory Visit: Payer: Self-pay

## 2020-04-07 ENCOUNTER — Encounter (HOSPITAL_BASED_OUTPATIENT_CLINIC_OR_DEPARTMENT_OTHER): Payer: Medicare HMO | Admitting: Physical Medicine & Rehabilitation

## 2020-04-07 ENCOUNTER — Other Ambulatory Visit: Payer: Self-pay

## 2020-04-07 ENCOUNTER — Encounter: Payer: Self-pay | Admitting: Physical Medicine & Rehabilitation

## 2020-04-07 VITALS — BP 110/68 | HR 77 | Temp 98.7°F | Ht 71.0 in | Wt 260.0 lb

## 2020-04-07 DIAGNOSIS — I110 Hypertensive heart disease with heart failure: Secondary | ICD-10-CM | POA: Diagnosis not present

## 2020-04-07 DIAGNOSIS — G8110 Spastic hemiplegia affecting unspecified side: Secondary | ICD-10-CM | POA: Diagnosis present

## 2020-04-07 DIAGNOSIS — G4733 Obstructive sleep apnea (adult) (pediatric): Secondary | ICD-10-CM | POA: Diagnosis not present

## 2020-04-07 DIAGNOSIS — I69354 Hemiplegia and hemiparesis following cerebral infarction affecting left non-dominant side: Secondary | ICD-10-CM | POA: Diagnosis not present

## 2020-04-07 DIAGNOSIS — E119 Type 2 diabetes mellitus without complications: Secondary | ICD-10-CM | POA: Diagnosis not present

## 2020-04-07 DIAGNOSIS — I252 Old myocardial infarction: Secondary | ICD-10-CM | POA: Diagnosis not present

## 2020-04-07 DIAGNOSIS — M1990 Unspecified osteoarthritis, unspecified site: Secondary | ICD-10-CM | POA: Diagnosis not present

## 2020-04-07 DIAGNOSIS — F17210 Nicotine dependence, cigarettes, uncomplicated: Secondary | ICD-10-CM | POA: Diagnosis not present

## 2020-04-07 DIAGNOSIS — G8114 Spastic hemiplegia affecting left nondominant side: Secondary | ICD-10-CM | POA: Diagnosis not present

## 2020-04-07 DIAGNOSIS — I2510 Atherosclerotic heart disease of native coronary artery without angina pectoris: Secondary | ICD-10-CM | POA: Diagnosis not present

## 2020-04-07 DIAGNOSIS — I509 Heart failure, unspecified: Secondary | ICD-10-CM | POA: Diagnosis not present

## 2020-04-07 DIAGNOSIS — E782 Mixed hyperlipidemia: Secondary | ICD-10-CM | POA: Diagnosis not present

## 2020-04-07 DIAGNOSIS — F419 Anxiety disorder, unspecified: Secondary | ICD-10-CM | POA: Diagnosis not present

## 2020-04-07 DIAGNOSIS — J449 Chronic obstructive pulmonary disease, unspecified: Secondary | ICD-10-CM | POA: Diagnosis not present

## 2020-04-07 NOTE — Progress Notes (Signed)
Dysport Injection for spasticity using needle EMG guidance  Dilution: 200 Units/ml Indication: Severe spasticity which interferes with ADL,mobility and/or  hygiene and is unresponsive to medication management and other conservative care Pt indicates LUE spasticity in shoulder and arm is main issue, hamstring spasms are not bothering him Informed consent was obtained after describing risks and benefits of the procedure with the patient. This includes bleeding, bruising, infection, excessive weakness, or medication side effects. A REMS form is on file and signed. Needle:  needle electrode Number of units per muscle LUE FDS 100 FDP 200  Biceps 200   Trunk Left pec  100U   LLE  Med gastroc 400  Tibialis post  300U All injections were done after obtaining appropriate EMG activity and after negative drawback for blood. The patient tolerated the procedure well. Post procedure instructions were given. A followup appointment was made.

## 2020-04-07 NOTE — Patient Instructions (Addendum)
You received a Dysport injection today. You may experience muscle pains and aches. He may apply ice 20 minutes every 2 hours as needed for the next 24-48 hours. He also noticed bleeding or bruising in the areas that were injected. May apply Band-Aid. If this bruising is extensive, please notify our office. If there is evidence of increasing redness that occurs 2-3 days after injection. Please call our office. This could be a sign of infection. It is very rare, however. You may experience some muscle weakness in the muscles and injected. This would likely start in about one week.  LUE FDS 100 FDP 200  Biceps 200   Trunk Left pec  300U   LLE   gastroc 400  Tibialis post  300U

## 2020-05-07 NOTE — Progress Notes (Deleted)
Cardiology Office Note  Date: 05/07/2020   ID: [name], DOB Oct 04, 1965, MRN [id number]  PCP:  Caryl Bis, MD  Cardiologist:  Rozann Lesches, MD Electrophysiologist:  None   Chief Complaint:   History of Present Illness: [name] is a 54 y.o. male with a history of CAD, cardiomyopathy, CHF, HTN, CVA, HLD, OSA, DM 2.  Last encounter with Dr. Domenic Polite 11/05/2019.  Reported having COVID-19 earlier in the year.  Had recently lost 15 pounds.  Chronic left leg swelling on the side affected by CVA.  Had some recent chest congestion.  And recently had labs with PCP.  Status post CABG with patent bypass grafts in 2015 and follow-up Myoview 2018 demonstrating large infarct scar without active ischemia.  He was doing well with out recurring angina.  Continue aspirin, Plavix, Lipitor, Coreg, and Imdur.  Last echocardiogram EF improved to 40 to 50% by echo in December 2020.  He was continuing Coreg, Entresto and Lasix.  Continuing Lipitor for hyperlipidemia.  Past Medical History:  Diagnosis Date  . Anaphylaxis    IGE mediated  . Anxiety   . Arthritis   . Cardiomyopathy ([place])   . CHF (congestive heart failure) ([place])   . COPD (chronic obstructive pulmonary disease) ([place])   . Coronary atherosclerosis of native coronary artery    Multivessel status post CABG 2010  . Depression   . Essential hypertension   . History of CVA (cerebrovascular accident) 01/2012   Right posterior frontal cortical and subcortical brain by MRI, no hemorrhage. Carotid Dopplers showed only 1-50% bilateral ICA stenoses. Echocardiogram showed LVEF 50%, no major valvular abnormalities.  . Mixed hyperlipidemia   . Myocardial infarction ([place]) 2010  . OSA (obstructive sleep apnea)   . Stroke ([place])   . Type 2 diabetes mellitus ([place])     Past Surgical History:  Procedure Laterality Date  . CHOLECYSTECTOMY    . CORONARY ARTERY BYPASS GRAFT  2010   LIMA to LAD, SVG to diagonal, SVG to OM1 and  OM 2, SVG to RCA  . DENTAL SURGERY  2003  . GASTRIC BYPASS  2010  . HERNIA REPAIR  2011, 2012  . INCISIONAL HERNIA REPAIR N/A 07/23/2013   Procedure: HERNIA REPAIR INCISIONAL WITH MESH;  Surgeon: Jamesetta So, MD;  Location: [facility];  Service: General;  Laterality: N/A;  . INSERTION OF MESH N/A 07/23/2013   Procedure: INSERTION OF MESH;  Surgeon: Jamesetta So, MD;  Location: [facility];  Service: General;  Laterality: N/A;  . LEFT HEART CATHETERIZATION WITH CORONARY/GRAFT ANGIOGRAM N/A 11/01/2013   Procedure: LEFT HEART CATHETERIZATION WITH Beatrix Fetters;  Surgeon: Blane Ohara, MD;  Location[facility] C[facility] LAB;  Service: Cardiovascular;  Laterality: N/A;  . TOE AMPUTATION  1998   right 1st and 2nd toe  . TONSILECTOMY, ADENOIDECTOMY, BILATERAL MYRINGOTOMY AND TUBES    . TONSILLECTOMY    . VENTRAL HERNIA REPAIR N/A 10/28/2012   Procedure: LAPAROSCOPIC VENTRAL HERNIA;  Surgeon: Donato Heinz, MD;  Location: [facility];  Service: General;  Laterality: N/A;    Current Outpatient Medications  Medication Sig Dispense Refill  . albuterol (PROVENTIL HFA;VENTOLIN HFA) 108 (90 BASE) MCG/ACT inhaler Inhale 2 puffs into the lungs every 6 (six) hours as needed for wheezing or shortness of breath.    . ALPRAZolam (XANAX) 1 MG tablet 0.5-1 mg daily as needed for anxiety 30 tablet 2  . aspirin EC 81 MG tablet Take 1 tablet (81 mg total) by mouth  daily. 90 tablet 3  . atorvastatin (LIPITOR) 40 MG tablet Take 1 tablet by mouth daily.    . baclofen (LIORESAL) 10 MG tablet     . baclofen (LIORESAL) 20 MG tablet Take 1 tablet (20 mg total) by mouth 3 (three) times daily. 90 tablet 5  . carvedilol (COREG) 6.25 MG tablet Take 6.25 mg by mouth 2 (two) times daily with a meal.    . Cholecalciferol (VITAMIN D) 2000 units tablet Take 2,000 Units by mouth daily at 12 noon.    . clopidogrel (PLAVIX) 75 MG tablet Take 1 tablet by mouth daily.    . dapsone 25 MG tablet Take by mouth.    . diltiazem (CARDIZEM CD) 240  MG 24 hr capsule     . DULoxetine (CYMBALTA) 30 MG capsule Take 90 mg daily (60 mg + 30 mg) 90 capsule 0  . DULoxetine (CYMBALTA) 60 MG capsule Take 90 mg daily (60 mg + 30 mg) 90 capsule 0  . ENTRESTO 49-51 MG TAKE (1) TABLET TWICE DAILY. 60 tablet 4  . fenofibrate micronized (LOFIBRA) 134 MG capsule Take 134 mg by mouth daily before breakfast.     . fluticasone (CUTIVATE) 0.05 % cream Apply 1 application topically 2 (two) times daily.    . furosemide (LASIX) 20 MG tablet Take 20 mg by mouth daily.     [name] gabapentin (NEURONTIN) 600 MG tablet Take 1 tablet by mouth 2 (two) times daily.     . isosorbide mononitrate (IMDUR) 60 MG 24 hr tablet Take 60 mg by mouth 2 (two) times daily.     . nitroGLYCERIN (NITROSTAT) 0.4 MG SL tablet Place 0.4 mg under the tongue every 5 (five) minutes as needed. For chest pain    . oxyCODONE (OXY IR/ROXICODONE) 5 MG immediate release tablet Take 5 mg by mouth every 6 (six) hours as needed for severe pain.     . silver sulfADIAZINE (SILVADENE) 1 % cream     . tiZANidine (ZANAFLEX) 2 MG tablet     . traZODone (DESYREL) 50 MG tablet 25-50 mg at night as needed for sleep 30 tablet 2  . triamcinolone cream (KENALOG) 0.1 %      No current facility-administered medications for this visit.   Allergies:  Contrast media [iodinated diagnostic agents], Ibuprofen, and Nsaids   Social History: The patient  reports that he has been smoking cigarettes. He started smoking about 37 years ago. He has a 45.00 pack-year smoking history. He has never used smokeless tobacco. He reports that he does not drink alcohol and does not use drugs.   Family History: The patient's family history includes Alcohol abuse in his brother; Breast cancer in his maternal aunt; Congestive Heart Failure in his mother; Diabetes in his mother; Heart disease in his father and mother; Hyperlipidemia in his mother; Lung cancer in his father; Suicidality in his cousin.   ROS:  Please see the history of present  illness. Otherwise, complete review of systems is positive for none.  All other systems are reviewed and negative.   Physical Exam: VS:  There were no vitals taken for this visit., BMI There is no height or weight on file to calculate BMI.  Wt Readings from Last 3 Encounters:  04/07/20 260 lb (117.9 kg)  01/06/20 240 lb (108.9 kg)  12/30/19 240 lb (108.9 kg)    General: Patient appears comfortable at rest. HEENT: Conjunctiva and lids normal, oropharynx clear with moist mucosa. Neck: Supple, no elevated JVP or carotid  bruits, no thyromegaly. Lungs: Clear to auscultation, nonlabored breathing at rest. Cardiac: Regular rate and rhythm, no S3 or significant systolic murmur, no pericardial rub. Abdomen: Soft, nontender, no hepatomegaly, bowel sounds present, no guarding or rebound. Extremities: No pitting edema, distal pulses 2+. Skin: Warm and dry.  Musculoskeletal: No kyphosis. Neuropsychiatric: Alert and oriented x3, affect grossly appropriate.  ECG:  {EKG/Telemetry Strips Reviewed:[id number]}  Recent Labwork: No results found for requested labs within last 8760 hours.     Component Value Date/Time   CHOL 156 11/01/2013 0710   TRIG 185 (H) 11/01/2013 0710   HDL 27 (L) 11/01/2013 0710   CHOLHDL 5.8 11/01/2013 0710   VLDL 37 11/01/2013 0710   LDLCALC 92 11/01/2013 0710    Other Studies Reviewed Today:  Lexiscan Myoview 06/18/2017([facility]): FINDINGS: Large fixed defect is present involving the apex and large portion of the periapical anterior wall. No evidence of ischemia. Wall motion analysis reveals apical dyskinesis and akinesis. Left ventricular ejection fraction is calculated to be 18%.  Echocardiogram 06/30/2019: 1. Left ventricular ejection fraction, by visual estimation, is 40-50%. The left ventricle has Low normal to mildly decreased function. There is no left ventricular hypertrophy. 2. Left ventricular diastolic parameters are indeterminate. 3. Very difficult  visualization of the LV, difficult interpretation of LVEF and wall motion. Roughly estimated LVEF is in the 40-50% range. There is probable hypokinesis of the anteroseptal wall and apex. Recommend contrast study for better evaluation. 4. Global right ventricle was not well visualized.The right ventricular size is not well visualized. Right vetricular wall thickness was not assessed. 5. Left atrial size was not well visualized. 6. Right atrial size was not well visualized. 7. The pericardium was not well visualized. 8. The mitral valve was not well visualized. No evidence of mitral valve regurgitation. No evidence of mitral stenosis. 9. The tricuspid valve is not well visualized. Tricuspid valve regurgitation is not demonstrated. 10. The aortic valve was not well visualized. Aortic valve regurgitation is mild. No evidence of aortic valve sclerosis or stenosis. 11. The pulmonic valve was not well visualized. Pulmonic valve regurgitation is not visualized. 12. The interatrial septum was not well visualized.   Assessment and Plan:  1. CAD in native artery   2. Ischemic cardiomyopathy   3. Mixed hyperlipidemia    1. CAD in native artery ***  2. Ischemic cardiomyopathy ***  3. Mixed hyperlipidemia ***  Medication Adjustments/Labs and Tests Ordered: Current medicines are reviewed at length with the patient today.  Concerns regarding medicines are outlined above.   Disposition: Follow-up with ***  Signed, [name] July, NP 05/07/2020 9:19 PM    [facility] at [facility] [place], [place], [place] [address] Phone: [phone]; Fax: [phone]

## 2020-05-08 ENCOUNTER — Ambulatory Visit (INDEPENDENT_AMBULATORY_CARE_PROVIDER_SITE_OTHER): Payer: Medicare HMO | Admitting: Cardiology

## 2020-05-08 ENCOUNTER — Ambulatory Visit: Payer: Medicare HMO | Admitting: Family Medicine

## 2020-05-08 ENCOUNTER — Encounter: Payer: Self-pay | Admitting: Cardiology

## 2020-05-08 VITALS — BP 98/63 | HR 72 | Ht 71.0 in | Wt 255.0 lb

## 2020-05-08 DIAGNOSIS — I25118 Atherosclerotic heart disease of native coronary artery with other forms of angina pectoris: Secondary | ICD-10-CM | POA: Diagnosis not present

## 2020-05-08 DIAGNOSIS — I255 Ischemic cardiomyopathy: Secondary | ICD-10-CM

## 2020-05-08 DIAGNOSIS — I2510 Atherosclerotic heart disease of native coronary artery without angina pectoris: Secondary | ICD-10-CM

## 2020-05-08 DIAGNOSIS — E782 Mixed hyperlipidemia: Secondary | ICD-10-CM

## 2020-05-08 NOTE — Patient Instructions (Addendum)
Medication Instructions:    Your physician recommends that you continue on your current medications as directed. Please refer to the Current Medication list given to you today.  Labwork:  None  Testing/Procedures: Your physician has requested that you have an echocardiogram. Echocardiography is a painless test that uses sound waves to create images of your heart. It provides your doctor with information about the size and shape of your heart and how well your heart's chambers and valves are working. This procedure takes approximately one hour. There are no restrictions for this procedure.  Follow-Up:  Your physician recommends that you schedule a follow-up appointment in: 6 months.  Any Other Special Instructions Will Be Listed Below (If Applicable).  If you need a refill on your cardiac medications before your next appointment, please call your pharmacy. 

## 2020-05-08 NOTE — Progress Notes (Signed)
Cardiology Office Note  Date: 05/08/2020   ID: CORDALE [name], DOB Dec 29, 1965, MRN [id number]  PCP:  Caryl Bis, MD  Cardiologist:  Rozann Lesches, MD Electrophysiologist:  None   Chief Complaint  Patient presents with  . Cardiac follow-up    History of Present Illness: [name] is a 54 y.o. male last seen in April.  He is here today with an assistant for a follow-up visit.  From a cardiac perspective, he does not report any angina symptoms, no increasing shortness of breath with basic ADLs.  He is in a wheelchair today as before.  I reviewed his medications.  He is now on Cardizem CD under the direction of a gastroenterologist, presumably for some component of esophageal spasm or dysmotility.  He states that he is tolerating it well overall.  I did talk with him about getting a follow-up echocardiogram to ensure no reduction in LVEF.  His last echocardiogram was in December 2020 at which point LVEF had improved to the range of 40 to 50%.  Present cardiac regimen includes aspirin, Lipitor, Coreg, Plavix, Entresto, Imdur, and Lasix.  Past Medical History:  Diagnosis Date  . Anaphylaxis    IGE mediated  . Anxiety   . Arthritis   . Cardiomyopathy ([place])   . CHF (congestive heart failure) ([place])   . COPD (chronic obstructive pulmonary disease) ([place])   . Coronary atherosclerosis of native coronary artery    Multivessel status post CABG 2010  . Depression   . Essential hypertension   . History of CVA (cerebrovascular accident) 01/2012   Right posterior frontal cortical and subcortical brain by MRI, no hemorrhage. Carotid Dopplers showed only 1-50% bilateral ICA stenoses. Echocardiogram showed LVEF 50%, no major valvular abnormalities.  . Mixed hyperlipidemia   . Myocardial infarction ([place]) 2010  . OSA (obstructive sleep apnea)   . Stroke ([place])   . Type 2 diabetes mellitus ([place])     Past Surgical History:  Procedure Laterality Date  . CHOLECYSTECTOMY     . CORONARY ARTERY BYPASS GRAFT  2010   LIMA to LAD, SVG to diagonal, SVG to OM1 and OM 2, SVG to RCA  . DENTAL SURGERY  2003  . GASTRIC BYPASS  2010  . HERNIA REPAIR  2011, 2012  . INCISIONAL HERNIA REPAIR N/A 07/23/2013   Procedure: HERNIA REPAIR INCISIONAL WITH MESH;  Surgeon: Jamesetta So, MD;  Location: [facility];  Service: General;  Laterality: N/A;  . INSERTION OF MESH N/A 07/23/2013   Procedure: INSERTION OF MESH;  Surgeon: Jamesetta So, MD;  Location: [facility];  Service: General;  Laterality: N/A;  . LEFT HEART CATHETERIZATION WITH CORONARY/GRAFT ANGIOGRAM N/A 11/01/2013   Procedure: LEFT HEART CATHETERIZATION WITH Beatrix Fetters;  Surgeon: Blane Ohara, MD;  Location: [facility] CATH LAB;  Service: Cardiovascular;  Laterality: N/A;  . TOE AMPUTATION  1998   right 1st and 2nd toe  . TONSILECTOMY, ADENOIDECTOMY, BILATERAL MYRINGOTOMY AND TUBES    . TONSILLECTOMY    . VENTRAL HERNIA REPAIR N/A 10/28/2012   Procedure: LAPAROSCOPIC VENTRAL HERNIA;  Surgeon: Donato Heinz, MD;  Location: [facility];  Service: General;  Laterality: N/A;    Current Outpatient Medications  Medication Sig Dispense Refill  . albuterol (PROVENTIL HFA;VENTOLIN HFA) 108 (90 BASE) MCG/ACT inhaler Inhale 2 puffs into the lungs every 6 (six) hours as needed for wheezing or shortness of breath.    . ALPRAZolam (XANAX) 1 MG tablet 0.5-1 mg daily as needed for  anxiety 30 tablet 2  . aspirin EC 81 MG tablet Take 1 tablet (81 mg total) by mouth daily. 90 tablet 3  . atorvastatin (LIPITOR) 40 MG tablet Take 1 tablet by mouth daily.    . baclofen (LIORESAL) 10 MG tablet Take 10 mg by mouth 2 (two) times daily.     . baclofen (LIORESAL) 20 MG tablet Take 1 tablet (20 mg total) by mouth 3 (three) times daily. (Patient taking differently: Take 20 mg by mouth 2 (two) times daily. ) 90 tablet 5  . carvedilol (COREG) 6.25 MG tablet Take 6.25 mg by mouth 2 (two) times daily with a meal.    . Cholecalciferol (VITAMIN D) 2000  units tablet Take 2,000 Units by mouth daily at 12 noon.    . clopidogrel (PLAVIX) 75 MG tablet Take 1 tablet by mouth daily.    . dapsone 25 MG tablet Take by mouth.    . diltiazem (CARDIZEM CD) 240 MG 24 hr capsule Take 240 mg by mouth daily.     . DULoxetine (CYMBALTA) 30 MG capsule Take 90 mg daily (60 mg + 30 mg) 90 capsule 0  . DULoxetine (CYMBALTA) 60 MG capsule Take 90 mg daily (60 mg + 30 mg) 90 capsule 0  . ENTRESTO 49-51 MG TAKE (1) TABLET TWICE DAILY. 60 tablet 4  . fluticasone (CUTIVATE) 0.05 % cream Apply 1 application topically 2 (two) times daily.    . furosemide (LASIX) 20 MG tablet Take 20 mg by mouth daily.     [name] gabapentin (NEURONTIN) 600 MG tablet Take 1 tablet by mouth 2 (two) times daily.     . isosorbide mononitrate (IMDUR) 60 MG 24 hr tablet Take 60 mg by mouth 2 (two) times daily.     . nitroGLYCERIN (NITROSTAT) 0.4 MG SL tablet Place 0.4 mg under the tongue every 5 (five) minutes as needed. For chest pain    . oxyCODONE (OXY IR/ROXICODONE) 5 MG immediate release tablet Take 5 mg by mouth every 6 (six) hours as needed for severe pain.     . silver sulfADIAZINE (SILVADENE) 1 % cream     . tiZANidine (ZANAFLEX) 2 MG tablet Take 2 mg by mouth once.     . traZODone (DESYREL) 50 MG tablet 25-50 mg at night as needed for sleep 30 tablet 2  . triamcinolone cream (KENALOG) 0.1 % Apply 1 application topically 2 (two) times daily.     . fenofibrate micronized (LOFIBRA) 134 MG capsule Take 134 mg by mouth daily before breakfast.  (Patient not taking: Reported on 05/08/2020)     No current facility-administered medications for this visit.   Allergies:  Contrast media [iodinated diagnostic agents], Ibuprofen, and Nsaids   ROS: No palpitations or syncope.  Physical Exam: VS:  BP 98/63   Pulse 72   Ht 5\' 11"  (1.803 m)   Wt 255 lb (115.7 kg)   BMI 35.57 kg/m , BMI Body mass index is 35.57 kg/m.  Wt Readings from Last 3 Encounters:  05/08/20 255 lb (115.7 kg)  04/07/20  260 lb (117.9 kg)  01/06/20 240 lb (108.9 kg)    General: Patient appears comfortable at rest.  Seated in a wheelchair. HEENT: Conjunctiva and lids normal, wearing a mask. Neck: Supple, no elevated JVP or carotid bruits, no thyromegaly. Lungs: Clear to auscultation, nonlabored breathing at rest. Cardiac: Regular rate and rhythm, no S3, soft systolic murmur. Abdomen: Soft, bowel sounds present. Extremities: Leg brace in place. Neuropsychiatric: Alert and oriented x3.  Chronic left-sided hemiparesis.  ECG:  An ECG dated 11/05/2019 was personally reviewed today and demonstrated:  Sinus rhythm with low voltage, probable old anteroseptal infarct pattern, nonspecific ST-T changes.  Recent Labwork:  February 2021: Cholesterol 132, triglycerides 129, HDL 31, LDL 78, BUN 14, creatinine 1.27, potassium 4.2, AST 34, ALT 11, TSH 1.69, hemoglobin 14.6, platelets 221  Other Studies Reviewed Today:  Lexiscan Myoview 06/18/2017([facility]): FINDINGS: Large fixed defect is present involving the apex and large portion of the periapical anterior wall. No evidence of ischemia. Wall motion analysis reveals apical dyskinesis and akinesis. Left ventricular ejection fraction is calculated to be 18%.  Echocardiogram 06/30/2019: 1. Left ventricular ejection fraction, by visual estimation, is 40-50%. The left ventricle has Low normal to mildly decreased function. There is no left ventricular hypertrophy. 2. Left ventricular diastolic parameters are indeterminate. 3. Very difficult visualization of the LV, difficult interpretation of LVEF and wall motion. Roughly estimated LVEF is in the 40-50% range. There is probable hypokinesis of the anteroseptal wall and apex. Recommend contrast study for better evaluation. 4. Global right ventricle was not well visualized.The right ventricular size is not well visualized. Right vetricular wall thickness was not assessed. 5. Left atrial size was not well visualized. 6.  Right atrial size was not well visualized. 7. The pericardium was not well visualized. 8. The mitral valve was not well visualized. No evidence of mitral valve regurgitation. No evidence of mitral stenosis. 9. The tricuspid valve is not well visualized. Tricuspid valve regurgitation is not demonstrated. 10. The aortic valve was not well visualized. Aortic valve regurgitation is mild. No evidence of aortic valve sclerosis or stenosis. 11. The pulmonic valve was not well visualized. Pulmonic valve regurgitation is not visualized. 12. The interatrial septum was not well visualized.  Assessment and Plan:  1.  Cardiomyopathy, LVEF 40 to 50% range by echocardiogram in December of last year.  Plan to continue Coreg, Entresto, Lasix, and Imdur.  We will follow-up repeat echocardiogram to ensure no decline in LVEF since he has been started on calcium channel blocker by gastroenterologist.  2.  Multivessel CAD status post CABG, patent bypass grafts as of 2015.  He reports no active angina at this time.  Continue aspirin, Plavix, and statin.  3.  History of stroke with chronic left-sided residua.  4.  Mixed hyperlipidemia, he remains on Lipitor.  Last LDL 78 in February.  Medication Adjustments/Labs and Tests Ordered: Current medicines are reviewed at length with the patient today.  Concerns regarding medicines are outlined above.   Tests Ordered: Orders Placed This Encounter  Procedures  . ECHOCARDIOGRAM COMPLETE    Medication Changes: No orders of the defined types were placed in this encounter.   Disposition:  Follow up 6 months in the [place] office.  Signed, Satira Sark, MD, [facility] 05/08/2020 4:27 PM    [facility] at [place], [place], [place] [address] Phone: [phone]; Fax: [phone]

## 2020-05-11 ENCOUNTER — Ambulatory Visit: Payer: Medicare HMO | Admitting: Cardiology

## 2020-05-19 ENCOUNTER — Other Ambulatory Visit: Payer: Self-pay

## 2020-05-19 ENCOUNTER — Encounter: Payer: Medicare HMO | Attending: Physical Medicine & Rehabilitation | Admitting: Physical Medicine & Rehabilitation

## 2020-05-19 ENCOUNTER — Encounter: Payer: Self-pay | Admitting: Physical Medicine & Rehabilitation

## 2020-05-19 VITALS — BP 94/63 | HR 78 | Temp 98.6°F | Ht 71.0 in | Wt 255.0 lb

## 2020-05-19 DIAGNOSIS — G8110 Spastic hemiplegia affecting unspecified side: Secondary | ICD-10-CM | POA: Insufficient documentation

## 2020-05-19 MED ORDER — BACLOFEN 20 MG PO TABS
20.0000 mg | ORAL_TABLET | Freq: Two times a day (BID) | ORAL | 5 refills | Status: DC
Start: 2020-05-19 — End: 2021-11-06

## 2020-05-19 NOTE — Progress Notes (Signed)
Subjective:    Patient ID: [name], male    DOB: 1965-08-15, 55 y.o.   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 The patient has a history of severe spasticity that has persisted despite physical and occupational therapy as well as oral spasticity medications.  The patient had an episode of dehydration resulting in increased Creat, baclofen was reduced. Creat down to 1.5 6 wk f/u post Dysport inj C/o increased involuntary leg spasm at noc  LUE FDS 100 FDP 200  Biceps 200   Trunk Left pec  100U   LLE  Med gastroc 400  Tibialis post  300U  Pain Inventory Average Pain 6 Pain Right Now 6 My pain is aching  LOCATION OF PAIN  Shoulder, leg, arm, finers  BOWEL Number of stools per week:  Once a day Oral laxative use No  Type of laxative na Enema or suppository use No  History of colostomy No  Incontinent No   BLADDER Normal In and out cath, frequency na Able to self cath No  Bladder incontinence Yes  Frequent urination No  Leakage with coughing No  Difficulty starting stream No  Incomplete bladder emptying No    Mobility use a walker ability to climb steps?  yes do you drive?  no use a wheelchair needs help with transfers  Function I need assistance with the following:  dressing, bathing, toileting, meal prep, household duties and shopping  Neuro/Psych bladder control problems weakness numbness  Prior Studies Any changes since last visit?  no  Physicians involved in your care Any changes since last visit?  no   Family History  Problem Relation Age of Onset  . Lung cancer Father   . Heart disease Father   . Heart disease Mother   . Congestive Heart Failure Mother   . Diabetes Mother   . Hyperlipidemia Mother   . Alcohol abuse Brother   . Breast cancer Maternal Aunt   . Suicidality Cousin    Social History   Socioeconomic History  . Marital  status: Legally Separated    Spouse name: Jolayne Haines  . Number of children: 2  . Years of education: Not on file  . Highest education level: 12th grade  Occupational History  . Occupation: disabled  Tobacco Use  . Smoking status: Current Every Day Smoker    Packs/day: 1.50    Years: 30.00    Pack years: 45.00    Types: Cigarettes    Start date: 06/08/1982  . Smokeless tobacco: Never Used  . Tobacco comment: 1 pack per day 03/04/2018  Vaping Use  . Vaping Use: Never used  Substance and Sexual Activity  . Alcohol use: No  . Drug use: No  . Sexual activity: Yes    Birth control/protection: None  Other Topics Concern  . Not on file  Social History Narrative   Disabled since age 5 lives with wife and children      Patient is right-handed. He is married, lives in a 1 story house, has a ramp to enter. Drinks 64oz of Mtn. Dew a day. Prior to CVA was walking daily. 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   Social Determinants of Health   Financial Resource Strain:   . Difficulty of Paying Living Expenses: Not on file  Food Insecurity:   . Worried About Charity fundraiser in the Last Year: Not on file  .  Ran Out of Food in the Last Year: Not on file  Transportation Needs:   . Lack of Transportation (Medical): Not on file  . Lack of Transportation (Non-Medical): Not on file  Physical Activity:   . Days of Exercise per Week: Not on file  . Minutes of Exercise per Session: Not on file  Stress:   . Feeling of Stress : Not on file  Social Connections:   . Frequency of Communication with Friends and Family: Not on file  . Frequency of Social Gatherings with Friends and Family: Not on file  . Attends Religious Services: Not on file  . Active Member of Clubs or Organizations: Not on file  . Attends Archivist Meetings: Not on file  . Marital Status: Not on file   Past Surgical History:  Procedure Laterality Date  . CHOLECYSTECTOMY    . CORONARY ARTERY  BYPASS GRAFT  2010   LIMA to LAD, SVG to diagonal, SVG to OM1 and OM 2, SVG to RCA  . DENTAL SURGERY  2003  . GASTRIC BYPASS  2010  . HERNIA REPAIR  2011, 2012  . INCISIONAL HERNIA REPAIR N/A 07/23/2013   Procedure: HERNIA REPAIR INCISIONAL WITH MESH;  Surgeon: Jamesetta So, MD;  Location: [facility];  Service: General;  Laterality: N/A;  . INSERTION OF MESH N/A 07/23/2013   Procedure: INSERTION OF MESH;  Surgeon: Jamesetta So, MD;  Location: [facility];  Service: General;  Laterality: N/A;  . LEFT HEART CATHETERIZATION WITH CORONARY/GRAFT ANGIOGRAM N/A 11/01/2013   Procedure: LEFT HEART CATHETERIZATION WITH Beatrix Fetters;  Surgeon: Blane Ohara, MD;  Location: [facility] CATH LAB;  Service: Cardiovascular;  Laterality: N/A;  . TOE AMPUTATION  1998   right 1st and 2nd toe  . TONSILECTOMY, ADENOIDECTOMY, BILATERAL MYRINGOTOMY AND TUBES    . TONSILLECTOMY    . VENTRAL HERNIA REPAIR N/A 10/28/2012   Procedure: LAPAROSCOPIC VENTRAL HERNIA;  Surgeon: Donato Heinz, MD;  Location: [facility];  Service: General;  Laterality: N/A;   Past Medical History:  Diagnosis Date  . Anaphylaxis    IGE mediated  . Anxiety   . Arthritis   . Cardiomyopathy ([place])   . CHF (congestive heart failure) ([place])   . COPD (chronic obstructive pulmonary disease) ([place])   . Coronary atherosclerosis of native coronary artery    Multivessel status post CABG 2010  . Depression   . Essential hypertension   . History of CVA (cerebrovascular accident) 01/2012   Right posterior frontal cortical and subcortical brain by MRI, no hemorrhage. Carotid Dopplers showed only 1-50% bilateral ICA stenoses. Echocardiogram showed LVEF 50%, no major valvular abnormalities.  . Mixed hyperlipidemia   . Myocardial infarction ([place]) 2010  . OSA (obstructive sleep apnea)   . Stroke ([place])   . Type 2 diabetes mellitus (HCC)    BP 94/63   Pulse 78   Temp 98.6 F (37 C)   Ht 5\' 11"  (1.803 m)   Wt 255 lb (115.7 kg)   SpO2 92%   BMI 35.57  kg/m   Opioid Risk Score:   Fall Risk Score:  `1  Depression screen PHQ 2/9  Depression screen [facility] 2/9 04/07/2020 11/06/2018 08/14/2018 05/19/2018 09/29/2017  Decreased Interest 0 0 1 1 1   Down, Depressed, Hopeless 0 0 1 1 3   PHQ - 2 Score 0 0 2 2 4   Altered sleeping - - - - 3  Tired, decreased energy - - - - 2  Change in appetite - - - -  3  Feeling bad or failure about yourself  - - - - 3  Trouble concentrating - - - - 3  Moving slowly or fidgety/restless - - - - 2  Suicidal thoughts - - - - 0  PHQ-9 Score - - - - 20  Difficult doing work/chores - - - - Somewhat difficult  Some recent data might be hidden    Review of Systems     Objective:   Physical Exam Constitutional:      Appearance: He is obese.  HENT:     Head: Normocephalic and atraumatic.  Eyes:     Extraocular Movements: Extraocular movements intact.     Conjunctiva/sclera: Conjunctivae normal.     Pupils: Pupils are equal, round, and reactive to light.  Neurological:     Mental Status: He is alert and oriented to person, place, and time. Mental status is at baseline.     Comments: MAS 3 at Left shoulder add (may have contrature/frozen shouylder) MAS 2 elbow flexor MAS 2 wrist flexor MAS 1 finger flexor MAS 2 lumbricals  MAS 4 ankle PF   Psychiatric:        Mood and Affect: Mood normal.           Assessment & Plan:  1.  Left spastic hemiparesis repeat same dose of Dysport in 6 wks, may add 200U to soleus Start therapy ([place]) ordered by PCP  Increase baclofen to 20mg  BID which is max dose for pt with this degree of renal impairment

## 2020-05-19 NOTE — Patient Instructions (Signed)
Please make sure you are taking baclofen 20mg  twice a day

## 2020-05-25 ENCOUNTER — Ambulatory Visit (INDEPENDENT_AMBULATORY_CARE_PROVIDER_SITE_OTHER): Payer: Medicare HMO

## 2020-05-25 DIAGNOSIS — I255 Ischemic cardiomyopathy: Secondary | ICD-10-CM | POA: Diagnosis not present

## 2020-05-25 LAB — ECHOCARDIOGRAM COMPLETE
Area-P 1/2: 4.49 cm2
P 1/2 time: 886 msec
S' Lateral: 3.24 cm

## 2020-05-26 ENCOUNTER — Telehealth: Payer: Self-pay | Admitting: *Deleted

## 2020-05-26 NOTE — Telephone Encounter (Signed)
-----   Message from Satira Sark, MD sent at 05/25/2020  7:28 PM EST ----- Results reviewed. Difficult study in terms of images, but LVEF stable at 45%. Continue with current medications and follow-up plan.

## 2020-05-26 NOTE — Telephone Encounter (Signed)
Patient informed. Copy sent to PCP °
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
## 2020-06-06 ENCOUNTER — Encounter: Payer: Self-pay | Admitting: Psychiatry

## 2020-06-06 ENCOUNTER — Other Ambulatory Visit: Payer: Self-pay

## 2020-06-06 ENCOUNTER — Telehealth (INDEPENDENT_AMBULATORY_CARE_PROVIDER_SITE_OTHER): Payer: Medicare HMO | Admitting: Psychiatry

## 2020-06-06 ENCOUNTER — Telehealth (HOSPITAL_COMMUNITY): Payer: Medicare HMO | Admitting: Psychiatry

## 2020-06-06 DIAGNOSIS — F419 Anxiety disorder, unspecified: Secondary | ICD-10-CM | POA: Diagnosis not present

## 2020-06-06 DIAGNOSIS — F3341 Major depressive disorder, recurrent, in partial remission: Secondary | ICD-10-CM

## 2020-06-06 MED ORDER — ALPRAZOLAM 1 MG PO TABS: ORAL_TABLET | ORAL | 2 refills | Status: DC

## 2020-06-06 MED ORDER — DULOXETINE HCL 60 MG PO CPEP: ORAL_CAPSULE | ORAL | 0 refills | Status: DC

## 2020-06-06 MED ORDER — DULOXETINE HCL 30 MG PO CPEP: ORAL_CAPSULE | ORAL | 0 refills | Status: DC

## 2020-06-06 NOTE — Patient Instructions (Signed)
1.Continueduloxetine 90 mg(60 mg + 30 mg)daily  2 Cotinue Xanax 0.5-1 mg daily as needed for anxiety 3.Next appointment:2/22 at 8:40

## 2020-07-09 NOTE — Progress Notes (Signed)
NEUROLOGY FOLLOW UP OFFICE NOTE  QUINTO TIPPY [id number]   Subjective:  [name] is a 54 year old right-handed man with hypertension, type 2 diabetes mellitus, CAD,ischemiccardiomyopathy, COPD, tobacco use disorder, depression and history of prior strokes who follows up for migraines.  UPDATE: Current medications: ASA 81mg , Plavix 75mg , atorvastatin 80mg ,Coreg, Imdur, Lasix, alprazolam 1mg  PRN, baclofen 10mg  BID PRN, Cymbalta 90mg  daily, gabapentin 600mg  BID  Headaches are worse.  Mild headaches occur twice daily for 30 to 60 minutes;  but he has one severe headache once a week.  He reports right occipital/suboccipital numbness and tingling off and on.     HISTORY: He was admitted to [facility] on 06/14/17 with sudden onset left sided weakness and numbness and difficulty speaking. He initially presented to [facility] where he received tPA with NIHSS of 15 and was then transferred to [facility]. CT of head from [facility] reportedly showed "acute infarction involving portions of the posterior right basal ganglia, right corona radiata and body of the right caudate nucleus". MRI of brain at [facility] confirmed acute right basal ganglia infarct as well as remote right frontal infarct. MRA of head and carotid doppler revealed no significant intracranial or extracranial arterial stenosis or occlusion. He was unable to have CTA due to allergy to iodinated contrast. Echocardiogram with bubble study showed EF of 25-30% with no evidence of PFO or ASD. He was evaluated by cardiology for ischemic cardiomyopathy. Lexiscan nuclear perfusion study revealed EF 18% with large infarct involving the apex and periapical anterior wall with no evidence of ischemia. He was advised to follow up with outpatient cardiology. LDL was 123 and triglycerides were 237. Hgb A1c was 5.7. He was already taking ASA 325mg  but not daily, as well as Plavix. He is continued on  ASA and Plavix.  06/30/2019 Echocardiogram: LV EF 40-50%  He was seen in the ED at [facility] on 09/22/17 for headache. CT of head showed no acute findings but was read as demonstrating a chronic appearing infarct in the right posterior limb of internal capsule that was not present on prior CT from 06/14/17. He also complained of left leg pain. He reportedly had an elevated d dimer. CT chest and venous doppler of lower extremity were negative for DVT. However, the started him on Xarelto. He was told to stop Plavix by his PCP. I contacted his PCP, Dr. Quillian Quince, who reviewed the ED notes and is also unsure why they started Xarelto when imaging was negative for DVT.He was advised to discontinue Xarelto and restart Plavix with aspirin.  Headaches returned in December 2020. Right sided 10/10 throbbing/pressure pain in the temple radiating to back of head. Associated nausea, blurred vision, photophobia. They last 2 hours and occur 2 to 3 days a week. He takes Tylenol. For the past month, he also notes that his right hand twitches about every other day, lasting 20-30 minutes. It may occur while holding a cup but also at rest. No recent change or added medications.Hereportedcough and loss of taste.He was concerned about Covid-19 but taste returned and cough resolved after 1 to 2 days.  Due to worsening headache, MRI of brain without contrast was performed on 09/13/2019, which was personally reviewed, and demonstrated stable advanced chronic small vessel ischemic changes but no acute intracranial abnormality. Due to right hand myoclonus, EEG was performed on 08/17/2019, which was normal.  He had neuropsychological testing performed on 03/31/18, which demonstrated vascular cognitive impairment affecting psychomotor processing speed, attention/working memory, and  executive functioning.Hedid go toPT and speech therapy.   He sees Dr. Letta Pate for Dysport injection to treat  spasticity. He reports panic attacks. He seesDr. Toy Care for depression and anxiety.   PAST MEDICAL HISTORY: Past Medical History:  Diagnosis Date  . Anaphylaxis    IGE mediated  . Anxiety   . Arthritis   . Cardiomyopathy ([place])   . CHF (congestive heart failure) ([place])   . COPD (chronic obstructive pulmonary disease) ([place])   . Coronary atherosclerosis of native coronary artery    Multivessel status post CABG 2010  . Depression   . Essential hypertension   . History of CVA (cerebrovascular accident) 01/2012   Right posterior frontal cortical and subcortical brain by MRI, no hemorrhage. Carotid Dopplers showed only 1-50% bilateral ICA stenoses. Echocardiogram showed LVEF 50%, no major valvular abnormalities.  . Mixed hyperlipidemia   . Myocardial infarction ([place]) 2010  . OSA (obstructive sleep apnea)   . Stroke ([place])   . Type 2 diabetes mellitus (H[facility])     MEDICATIONS: Current Outpatient Medications on File Prior to Visit  Medication Sig Dispense Refill  . albuterol (PROVENTIL HFA;VENTOLIN HFA) 108 (90 BASE) MCG/ACT inhaler Inhale 2 puffs into the lungs every 6 (six) hours as needed for wheezing or shortness of breath.    . ALPRAZolam (XANAX) 1 MG tablet 0.5-1 mg daily as needed for anxiety 30 tablet 2  . aspirin EC 81 MG tablet Take 1 tablet (81 mg total) by mouth daily. 90 tablet 3  . atorvastatin (LIPITOR) 40 MG tablet Take 1 tablet by mouth daily.    . baclofen (LIORESAL) 20 MG tablet Take 1 tablet (20 mg total) by mouth 2 (two) times daily. 60 tablet 5  . carvedilol (COREG) 6.25 MG tablet Take 6.25 mg by mouth 2 (two) times daily with a meal.    . Cholecalciferol (VITAMIN D) 2000 units tablet Take 2,000 Units by mouth daily at 12 noon.    . clopidogrel (PLAVIX) 75 MG tablet Take 1 tablet by mouth daily.    [name] diltiazem (CARDIZEM CD) 240 MG 24 hr capsule Take 240 mg by mouth daily.     . DULoxetine (CYMBALTA) 30 MG capsule Take 90 mg daily (60 mg + 30 mg) 90 capsule 0  .  DULoxetine (CYMBALTA) 60 MG capsule Take 90 mg daily (60 mg + 30 mg) 90 capsule 0  . ENTRESTO 49-51 MG TAKE (1) TABLET TWICE DAILY. 60 tablet 4  . fenofibrate micronized (LOFIBRA) 134 MG capsule Take 134 mg by mouth daily before breakfast.     . fluticasone (CUTIVATE) 0.05 % cream Apply 1 application topically 2 (two) times daily.    . furosemide (LASIX) 20 MG tablet Take 20 mg by mouth daily.     [name] gabapentin (NEURONTIN) 600 MG tablet Take 1 tablet by mouth 2 (two) times daily.     . isosorbide mononitrate (IMDUR) 60 MG 24 hr tablet Take 60 mg by mouth 2 (two) times daily.     . nitroGLYCERIN (NITROSTAT) 0.4 MG SL tablet Place 0.4 mg under the tongue every 5 (five) minutes as needed. For chest pain    . oxyCODONE (OXY IR/ROXICODONE) 5 MG immediate release tablet Take 5 mg by mouth every 6 (six) hours as needed for severe pain.     . silver sulfADIAZINE (SILVADENE) 1 % cream     . tiZANidine (ZANAFLEX) 2 MG tablet Take 2 mg by mouth once.     . traZODone (DESYREL) 50 MG tablet 25-50  mg at night as needed for sleep 30 tablet 2  . triamcinolone cream (KENALOG) 0.1 % Apply 1 application topically 2 (two) times daily.      No current facility-administered medications on file prior to visit.    ALLERGIES: Allergies  Allergen Reactions  . Contrast Media [Iodinated Diagnostic Agents] Anaphylaxis, Shortness Of Breath, Swelling and Rash    Isovue contrast is most acceptable agent based on previous experience and testing with premedications  . Ibuprofen Anaphylaxis, Hives and Swelling  . Nsaids Anaphylaxis, Hives, Swelling and Rash    Can take Aspirin 325 mg or lower    FAMILY HISTORY: Family History  Problem Relation Age of Onset  . Lung cancer Father   . Heart disease Father   . Heart disease Mother   . Congestive Heart Failure Mother   . Diabetes Mother   . Hyperlipidemia Mother   . Alcohol abuse Brother   . Breast cancer Maternal Aunt   . Suicidality Cousin     SOCIAL  HISTORY: Social History   Socioeconomic History  . Marital status: Legally Separated    Spouse name: Jolayne Haines  . Number of children: 2  . Years of education: Not on file  . Highest education level: 12th grade  Occupational History  . Occupation: disabled  Tobacco Use  . Smoking status: Current Every Day Smoker    Packs/day: 1.50    Years: 30.00    Pack years: 45.00    Types: Cigarettes    Start date: 06/08/1982  . Smokeless tobacco: Never Used  . Tobacco comment: 1 pack per day 03/04/2018  Vaping Use  . Vaping Use: Never used  Substance and Sexual Activity  . Alcohol use: No  . Drug use: No  . Sexual activity: Yes    Birth control/protection: None  Other Topics Concern  . Not on file  Social History Narrative   Disabled since age 33 lives with wife and children      Patient is right-handed. He is married, lives in a 1 story house, has a ramp to enter. Drinks 64oz of Mtn. Dew a day. Prior to CVA was walking daily. 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   Social Determinants of Health   Financial Resource Strain: Not on file  Food Insecurity: Not on file  Transportation Needs: Not on file  Physical Activity: Not on file  Stress: Not on file  Social Connections: Not on file  Intimate Partner Violence: Not on file     Objective:  Blood pressure (!) 91/53, pulse 67, height 5\' 11"  (1.803 m), weight 250 lb (113.4 kg), SpO2 94 %. General: No acute distress.  Patient appears well-groomed.   Head:  Normocephalic/atraumatic Eyes:  Fundi examined but not visualized Neck: supple, no paraspinal tenderness, full range of motion Heart:  Regular rate and rhythm Lungs:  Clear to auscultation bilaterally Back: No paraspinal tenderness Neurological Exam: alert and oriented to person, place, and time. Attention span and concentration decreased, recent memory impaired, remote memory intact, fund of knowledge intact.  Speech fluent and not dysarthric, language intact.   Decreased left V2.  Otherwise, CN II-XII intact. Bulk and tone flaccid in left upper extremity.  Muscle strength plegic left upper extremity except 2+/5 hand grip, 2+/5 left hip flexion, plegic left knee flexion, left foot drop, left knee extension; 5/5 right upper and lower extremities.  Sensation to pinprick decreased in left lower extremity, decreased vibratory sensation bilateral lower extremities.  Deep tendon reflexes 3+  left upper and lower extremities with sustained clonus in left ankle, 2+ on right.  Finger to nose testing intact on right, unable to assess left.  Gait:  In wheelchair.   Assessment/Plan:   1.  Right sided occipital neuralgia/cervicalgia/cervicogenic headache 2.  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  Vascular mild neurocognitive disorder 4.  Hypertension 5.  Hyperlipidemia 6.  Type 2 diabetes mellitus 7.  Ischemic cardiomyopathy 8.  Generalized anxiety disorder with depression 9.  Myoclonus   1.  As he has failed multiple therapies (baclofen, Cymbalta, gabapentin) for headache/cervicalgia, will check MRI of cervical spine 2.  Secondary stroke prevention as per PCP: -  ASA and Plavix - Lipitor 40mg  (LDL goal less than 70) -  Glycemic control (Hgb A1c goal less than 7) -  Blood pressure control 3.  He would like to avoid making further changes to his medication if possible.  I have recommended contacting his insurance to see if they may cover occipital nerve block 4.  Follow up in 6 months or sooner Metta Clines, DO  CC: Gar Ponto, MD

## 2020-07-10 ENCOUNTER — Encounter: Payer: Self-pay | Admitting: Neurology

## 2020-07-10 ENCOUNTER — Ambulatory Visit (INDEPENDENT_AMBULATORY_CARE_PROVIDER_SITE_OTHER): Payer: Medicare HMO | Admitting: Neurology

## 2020-07-10 ENCOUNTER — Other Ambulatory Visit: Payer: Self-pay

## 2020-07-10 VITALS — BP 91/53 | HR 67 | Ht 71.0 in | Wt 250.0 lb

## 2020-07-10 DIAGNOSIS — M542 Cervicalgia: Secondary | ICD-10-CM

## 2020-07-10 DIAGNOSIS — G253 Myoclonus: Secondary | ICD-10-CM | POA: Diagnosis not present

## 2020-07-10 DIAGNOSIS — M5481 Occipital neuralgia: Secondary | ICD-10-CM | POA: Diagnosis not present

## 2020-07-10 DIAGNOSIS — I69354 Hemiplegia and hemiparesis following cerebral infarction affecting left non-dominant side: Secondary | ICD-10-CM

## 2020-07-10 NOTE — Patient Instructions (Signed)
1.  We will check MRI of the cervical spine without contrast 2.  Contact your insurance to see if they would cover occipital nerve block.  That would be an option if you do not want to increase medications 3.  Follow up in 6 months or sooner if needed.

## 2020-07-11 ENCOUNTER — Encounter: Payer: Self-pay | Admitting: Physical Medicine & Rehabilitation

## 2020-07-11 ENCOUNTER — Encounter: Payer: Medicare HMO | Attending: Physical Medicine & Rehabilitation | Admitting: Physical Medicine & Rehabilitation

## 2020-07-11 VITALS — BP 92/59 | HR 71 | Temp 98.6°F | Ht 71.0 in | Wt 250.0 lb

## 2020-07-11 DIAGNOSIS — G8110 Spastic hemiplegia affecting unspecified side: Secondary | ICD-10-CM | POA: Diagnosis present

## 2020-07-11 NOTE — Patient Instructions (Addendum)
LUE FDS 100 FDP 200  Biceps 200   Trunk Left pec  100U   LLE  Med gastroc 400U medial 200U lateral   Tibialis post  300U

## 2020-07-11 NOTE — Progress Notes (Signed)
Dysport Injection for spasticity using needle EMG guidance  Dilution: 200 Units/ml Indication: Severe spasticity which interferes with ADL,mobility and/or  hygiene and is unresponsive to medication management and other conservative care Pt indicates LUE spasticity in shoulder and arm is main issue, hamstring spasms are not bothering him Informed consent was obtained after describing risks and benefits of the procedure with the patient. This includes bleeding, bruising, infection, excessive weakness, or medication side effects. A REMS form is on file and signed. Needle:  needle electrode Number of units per muscle LUE FDS 100 FDP 200  Biceps 200   Trunk Left pec  100U   LLE  Med gastroc 400U  Tibialis post  300U  Lat gastroc  200U All injections were done after obtaining appropriate EMG activity and after negative drawback for blood. The patient tolerated the procedure well. Post procedure instructions were given. A followup appointment was made.

## 2020-07-24 ENCOUNTER — Ambulatory Visit (INDEPENDENT_AMBULATORY_CARE_PROVIDER_SITE_OTHER): Payer: Medicare HMO | Admitting: Urology

## 2020-07-24 ENCOUNTER — Encounter: Payer: Self-pay | Admitting: Urology

## 2020-07-24 ENCOUNTER — Other Ambulatory Visit: Payer: Self-pay

## 2020-07-24 VITALS — BP 97/64 | HR 69 | Temp 97.4°F | Ht 71.0 in | Wt 255.0 lb

## 2020-07-24 DIAGNOSIS — R972 Elevated prostate specific antigen [PSA]: Secondary | ICD-10-CM | POA: Diagnosis not present

## 2020-07-24 DIAGNOSIS — R3912 Poor urinary stream: Secondary | ICD-10-CM

## 2020-07-24 LAB — BLADDER SCAN AMB NON-IMAGING: Scan Result: 261

## 2020-07-24 MED ORDER — TAMSULOSIN HCL 0.4 MG PO CAPS: 0.4000 mg | ORAL_CAPSULE | Freq: Every day | ORAL | 11 refills | Status: DC

## 2020-07-24 NOTE — Progress Notes (Signed)
07/24/2020 3:43 PM   [name] 10-13-1965 [id number]  Referring provider: Caryl Bis, MD [place],  [place] [address]  Elevated PSA  HPI: Mr Callens is a 55yo here for evaluation of elevated PSA. PSA was 14.9 in 05/2020. He has severe LUTS on no BPH therapy. He has had a weak urinary stream, straining to urinate, and hesitancy. PVR 261. He has a hx of CAD, CVA and is currently on plavix. No family hx of prostate cancer.   PMH: Past Medical History:  Diagnosis Date  . Anaphylaxis    IGE mediated  . Anxiety   . Arthritis   . Cardiomyopathy ([place])   . CHF (congestive heart failure) ([place])   . COPD (chronic obstructive pulmonary disease) ([place])   . Coronary atherosclerosis of native coronary artery    Multivessel status post CABG 2010  . Depression   . Essential hypertension   . History of CVA (cerebrovascular accident) 01/2012   Right posterior frontal cortical and subcortical brain by MRI, no hemorrhage. Carotid Dopplers showed only 1-50% bilateral ICA stenoses. Echocardiogram showed LVEF 50%, no major valvular abnormalities.  . Mixed hyperlipidemia   . Myocardial infarction ([place]) 2010  . OSA (obstructive sleep apnea)   . Stroke ([place])   . Type 2 diabetes mellitus [facility])     Surgical History: Past Surgical History:  Procedure Laterality Date  . CHOLECYSTECTOMY    . CORONARY ARTERY BYPASS GRAFT  2010   LIMA to LAD, SVG to diagonal, SVG to OM1 and OM 2, SVG to RCA  . DENTAL SURGERY  2003  . GASTRIC BYPASS  2010  . HERNIA REPAIR  2011, 2012  . INCISIONAL HERNIA REPAIR N/A 07/23/2013   Procedure: HERNIA REPAIR INCISIONAL WITH MESH;  Surgeon: Jamesetta So, MD;  Location: [facility];  Service: General;  Laterality: N/A;  . INSERTION OF MESH N/A 07/23/2013   Procedure: INSERTION OF MESH;  Surgeon: Jamesetta So, MD;  Location: [facility];  Service: General;  Laterality: N/A;  . LEFT HEART CATHETERIZATION WITH CORONARY/GRAFT ANGIOGRAM N/A 11/01/2013   Procedure: LEFT  HEART CATHETERIZATION WITH Beatrix Fetters;  Surgeon: Blane Ohara, MD;  Location: [facility] CATH LAB;  Service: Cardiovascular;  Laterality: N/A;  . TOE AMPUTATION  1998   right 1st and 2nd toe  . TONSILECTOMY, ADENOIDECTOMY, BILATERAL MYRINGOTOMY AND TUBES    . TONSILLECTOMY    . VENTRAL HERNIA REPAIR N/A 10/28/2012   Procedure: LAPAROSCOPIC VENTRAL HERNIA;  Surgeon: Donato Heinz, MD;  Location: [facility];  Service: General;  Laterality: N/A;    Home Medications:  Allergies as of 07/24/2020      Reactions   Contrast Media [iodinated Diagnostic Agents] Anaphylaxis, Shortness Of Breath, Swelling, Rash   Isovue contrast is most acceptable agent based on previous experience and testing with premedications   Ibuprofen Anaphylaxis, Hives, Swelling   Nsaids Anaphylaxis, Hives, Swelling, Rash   Can take Aspirin 325 mg or lower   Other Other (See Comments)   Anti-physcotics Personality change      Medication List       Accurate as of July 24, 2020  3:43 PM. If you have any questions, ask your nurse or doctor.        albuterol 108 (90 Base) MCG/ACT inhaler Commonly known as: VENTOLIN HFA Inhale 2 puffs into the lungs every 6 (six) hours as needed for wheezing or shortness of breath.   ALPRAZolam 1 MG tablet Commonly known as: XANAX 0.5-1 mg  daily as needed for anxiety   aspirin EC 81 MG tablet Take 1 tablet (81 mg total) by mouth daily.   atorvastatin 40 MG tablet Commonly known as: LIPITOR Take 1 tablet by mouth daily.   baclofen 20 MG tablet Commonly known as: LIORESAL Take 1 tablet (20 mg total) by mouth 2 (two) times daily.   carvedilol 6.25 MG tablet Commonly known as: COREG Take 6.25 mg by mouth 2 (two) times daily with a meal.   clopidogrel 75 MG tablet Commonly known as: PLAVIX Take 1 tablet by mouth daily.   dapsone 25 MG tablet   diltiazem 240 MG 24 hr capsule Commonly known as: CARDIZEM CD Take 240 mg by mouth daily.   DULoxetine 30 MG  capsule Commonly known as: CYMBALTA Take 90 mg daily (60 mg + 30 mg)   DULoxetine 60 MG capsule Commonly known as: CYMBALTA Take 90 mg daily (60 mg + 30 mg)   Entresto 49-51 MG Generic drug: sacubitril-valsartan TAKE (1) TABLET TWICE DAILY.   fenofibrate micronized 134 MG capsule Commonly known as: LOFIBRA Take 134 mg by mouth daily before breakfast.   fluticasone 0.05 % cream Commonly known as: CUTIVATE Apply 1 application topically 2 (two) times daily.   furosemide 20 MG tablet Commonly known as: LASIX Take 20 mg by mouth daily.   gabapentin 600 MG tablet Commonly known as: NEURONTIN Take 1 tablet by mouth 2 (two) times daily.   isosorbide mononitrate 60 MG 24 hr tablet Commonly known as: IMDUR Take 60 mg by mouth 2 (two) times daily.   nitroGLYCERIN 0.4 MG SL tablet Commonly known as: NITROSTAT Place 0.4 mg under the tongue every 5 (five) minutes as needed. For chest pain   oxyCODONE 5 MG immediate release tablet Commonly known as: Oxy IR/ROXICODONE Take 5 mg by mouth every 6 (six) hours as needed for severe pain.   oxyCODONE-acetaminophen 7.5-325 MG tablet Commonly known as: PERCOCET   silver sulfADIAZINE 1 % cream Commonly known as: SILVADENE   tiZANidine 2 MG tablet Commonly known as: ZANAFLEX Take 2 mg by mouth once.   traZODone 50 MG tablet Commonly known as: DESYREL 25-50 mg at night as needed for sleep   triamcinolone 0.1 % Commonly known as: KENALOG Apply 1 application topically 2 (two) times daily.   Vitamin D 50 MCG (2000 UT) tablet Take 2,000 Units by mouth daily at 12 noon.       Allergies:  Allergies  Allergen Reactions  . Contrast Media [Iodinated Diagnostic Agents] Anaphylaxis, Shortness Of Breath, Swelling and Rash    Isovue contrast is most acceptable agent based on previous experience and testing with premedications  . Ibuprofen Anaphylaxis, Hives and Swelling  . Nsaids Anaphylaxis, Hives, Swelling and Rash    Can take  Aspirin 325 mg or lower  . Other Other (See Comments)    Anti-physcotics Personality change    Family History: Family History  Problem Relation Age of Onset  . Lung cancer Father   . Heart disease Father   . Heart disease Mother   . Congestive Heart Failure Mother   . Diabetes Mother   . Hyperlipidemia Mother   . Alcohol abuse Brother   . Breast cancer Maternal Aunt   . Suicidality Cousin     Social History:  reports that he has been smoking cigarettes. He started smoking about 38 years ago. He has a 45.00 pack-year smoking history. He has never used smokeless tobacco. He reports that he does not drink alcohol and  does not use drugs.  ROS: All other review of systems were reviewed and are negative except what is noted above in HPI  Physical Exam: BP 97/64   Pulse 69   Temp (!) 97.4 F (36.3 C)   Ht 5\' 11"  (1.803 m)   Wt 255 lb (115.7 kg)   BMI 35.57 kg/m   Constitutional:  Alert and oriented, No acute distress. HEENT: [place] AT, moist mucus membranes.  Trachea midline, no masses. Cardiovascular: No clubbing, cyanosis, or edema. Respiratory: Normal respiratory effort, no increased work of breathing. GI: Abdomen is soft, nontender, nondistended, no abdominal masses GU: No CVA tenderness.  Lymph: No cervical or inguinal lymphadenopathy. Skin: No rashes, bruises or suspicious lesions. Neurologic: Grossly intact, no focal deficits, moving all 4 extremities. Left leg brace present Psychiatric: Normal mood and affect.  Laboratory Data: Lab Results  Component Value Date   WBC 13.4 (H) 07/28/2017   HGB 14.9 07/28/2017   HCT 44.6 07/28/2017   MCV 86.8 07/28/2017   PLT 254 07/28/2017    Lab Results  Component Value Date   CREATININE 1.09 07/28/2017    No results found for: PSA  No results found for: TESTOSTERONE  Lab Results  Component Value Date   HGBA1C 6.0 (H) 10/31/2013    Urinalysis    Component Value Date/Time   COLORURINE YELLOW 06/21/2013 2323    APPEARANCEUR CLEAR 06/21/2013 2323   LABSPEC 1.020 06/21/2013 2323   PHURINE 7.0 06/21/2013 2323   GLUCOSEU NEGATIVE 06/21/2013 2323   HGBUR TRACE (A) 06/21/2013 2323   BILIRUBINUR NEGATIVE 06/21/2013 2323   KETONESUR NEGATIVE 06/21/2013 2323   PROTEINUR NEGATIVE 06/21/2013 2323   UROBILINOGEN 0.2 06/21/2013 2323   NITRITE NEGATIVE 06/21/2013 2323   LEUKOCYTESUR NEGATIVE 06/21/2013 2323    Lab Results  Component Value Date   BACTERIA FEW (A) 06/21/2013    Pertinent Imaging:  Results for orders placed during the hospital encounter of 02/19/07  DG Abd 1 View  Narrative History: Left renal calculus, status post lithotripsy  ABDOMEN ONE VIEW:  Tiny residual stone fragment inferior pole left kidney, 1.5 mm diameter. No distal ureteral calculi evident. Tiny phleboliths in pelvis bilaterally stable. Bones unremarkable. Bowel gas pattern normal.  IMPRESSION: Single tiny residual stone fragment inferior pole left kidney.  Provider: Vaughan Basta, [place]  No results found for this or any previous visit.  No results found for this or any previous visit.  No results found for this or any previous visit.  No results found for this or any previous visit.  No results found for this or any previous visit.  No results found for this or any previous visit.  No results found for this or any previous visit.   Assessment & Plan:    1. Elevated PSA -The patient and I talked about etiologies of elevated PSA.  We discussed the possible relationship between elevated PSA and prostate cancer, BPH, prostatitis, infection trauma and recent ejaculations.  The patient's PSA is likely related to his urinary retention and severe LUTS and as such I recommended that we follow-up with a repeat PSA in 2 months.  If it remains elevated with a positive rising trend we will discuss prostate biopsy at his follow-up appointment.  - Urinalysis, Routine w reflex microscopic  2. Weak urinary  stream -We will start flomax 0.4mg  daily   No follow-ups on file.  Nicolette Bang, MD  [facility] [place]

## 2020-07-24 NOTE — Progress Notes (Signed)
Bladder Scan Patient cannot void: 261 ml Performed By: Sheika Coutts,lpn    Urological Symptom Review  Patient is experiencing the following symptoms: Trouble starting stream  Have to strain to urinate Erection problems   Review of Systems  Gastrointestinal (upper)  : Negative for upper GI symptoms  Gastrointestinal (lower) : Negative for lower GI symptoms  Constitutional : Negative for symptoms  Skin: Negative for skin symptoms  Eyes: Blurred vision  Ear/Nose/Throat : Negative for Ear/Nose/Throat symptoms  Hematologic/Lymphatic: Negative for Hematologic/Lymphatic symptoms  Cardiovascular : Leg swelling Chest pain  Respiratory : Cough Shortness of breath  Endocrine: Negative for endocrine symptoms  Musculoskeletal: Back/joint paine  Neurological: Negative for neurological symptoms  Psychologic: Depression Anxiety

## 2020-08-08 ENCOUNTER — Ambulatory Visit
Admission: RE | Admit: 2020-08-08 | Discharge: 2020-08-08 | Disposition: A | Discharge status: Home / Self Care | Payer: Medicare HMO | Source: Ambulatory Visit | Attending: Neurology | Admitting: Neurology

## 2020-08-08 ENCOUNTER — Ambulatory Visit: Payer: Medicare HMO | Admitting: Urology

## 2020-08-08 ENCOUNTER — Other Ambulatory Visit: Payer: Self-pay

## 2020-08-08 DIAGNOSIS — G253 Myoclonus: Secondary | ICD-10-CM

## 2020-08-08 DIAGNOSIS — I69354 Hemiplegia and hemiparesis following cerebral infarction affecting left non-dominant side: Secondary | ICD-10-CM

## 2020-08-08 DIAGNOSIS — M5481 Occipital neuralgia: Secondary | ICD-10-CM

## 2020-08-08 DIAGNOSIS — M542 Cervicalgia: Secondary | ICD-10-CM

## 2020-08-08 MED ORDER — GADOBENATE DIMEGLUMINE 529 MG/ML IV SOLN
20.0000 mL | Freq: Once | INTRAVENOUS | Status: AC | PRN
  Administered 2020-08-08: 20 mL via INTRAVENOUS

## 2020-08-09 ENCOUNTER — Telehealth: Payer: Self-pay

## 2020-08-09 NOTE — Telephone Encounter (Signed)
Called patient and informed him of results. Patient had no questions or concerns.

## 2020-08-09 NOTE — Telephone Encounter (Signed)
-----   Message from Pieter Partridge, DO sent at 08/08/2020  3:58 PM EST ----- MRI shows no cause of head pain.  No change in management at this time

## 2020-08-17 ENCOUNTER — Other Ambulatory Visit: Payer: Self-pay | Admitting: Cardiology

## 2020-08-29 ENCOUNTER — Encounter: Payer: Self-pay | Admitting: Physical Medicine & Rehabilitation

## 2020-08-29 ENCOUNTER — Encounter: Payer: Medicare HMO | Attending: Physical Medicine & Rehabilitation | Admitting: Physical Medicine & Rehabilitation

## 2020-08-29 ENCOUNTER — Other Ambulatory Visit: Payer: Self-pay

## 2020-08-29 VITALS — BP 106/64 | HR 68 | Temp 98.7°F

## 2020-08-29 DIAGNOSIS — G8110 Spastic hemiplegia affecting unspecified side: Secondary | ICD-10-CM | POA: Diagnosis present

## 2020-08-29 NOTE — Patient Instructions (Addendum)
You have refills for Baclofen 20mg  Twice a day at [facility] Please call them   You may benefit from orthopedic surgery consultation to discuss achilles tendon lengthening procedure , this should help you get your heel on the floor Please let me know if you'd like to proceed  We can discuss this at next visit  To help with leg shaking I would recommend putting the Dysport medicine in the quad and hamstring muscles

## 2020-08-29 NOTE — Progress Notes (Signed)
Subjective:    Patient ID: [name], male    DOB: 01/29/1966, 55 y.o.   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 Patient returns today with complaints of left lower extremity shaking.  He also states that he cannot touch his heel to the ground unless he does some prolonged standing and putting extra pressure on the left side. In his upper extremity he is having some increased spasm in his right thumb.  His thumb moves toward the palm of his hand 07/11/2020 Dysport injection LUE FDS 100 FDP 200  Biceps 200   Trunk Left pec  100U   LLE  Med gastroc 400U  Tibialis post  300U  Lat gastroc  200U Pain Inventory Average Pain 4 Pain Right Now 4 My pain is tingling and aching  In the last 24 hours, has pain interfered with the following? General activity 6 Relation with others 0 Enjoyment of life 0 What TIME of day is your pain at its worst? night Sleep (in general) Poor  Pain is worse with: walking and some activites Pain improves with: pacing activities and medication Relief from Meds: ?  Family History  Problem Relation Age of Onset  . Lung cancer Father   . Heart disease Father   . Heart disease Mother   . Congestive Heart Failure Mother   . Diabetes Mother   . Hyperlipidemia Mother   . Alcohol abuse Brother   . Breast cancer Maternal Aunt   . Suicidality Cousin    Social History   Socioeconomic History  . Marital status: Legally Separated    Spouse name: Jolayne Haines  . Number of children: 2  . Years of education: Not on file  . Highest education level: 12th grade  Occupational History  . Occupation: disabled  Tobacco Use  . Smoking status: Current Every Day Smoker    Packs/day: 1.50    Years: 30.00    Pack years: 45.00    Types: Cigarettes    Start date: 06/08/1982  . Smokeless tobacco: Never Used  . Tobacco comment: 1 pack per day 03/04/2018  Vaping  Use  . Vaping Use: Never used  Substance and Sexual Activity  . Alcohol use: No  . Drug use: No  . Sexual activity: Yes    Birth control/protection: None  Other Topics Concern  . Not on file  Social History Narrative   Disabled since age 60 lives with wife and children      Patient is right-handed. He is married, lives in a 1 story house, has a ramp to enter. Drinks 64oz of Mtn. Dew a day. Prior to CVA was walking daily. 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   Social Determinants of Health   Financial Resource Strain: Not on file  Food Insecurity: Not on file  Transportation Needs: Not on file  Physical Activity: Not on file  Stress: Not on file  Social Connections: Not on file   Past Surgical History:  Procedure Laterality Date  . CHOLECYSTECTOMY    . CORONARY ARTERY BYPASS GRAFT  2010   LIMA to LAD, SVG to diagonal, SVG to OM1 and OM 2, SVG to RCA  . DENTAL SURGERY  2003  . GASTRIC BYPASS  2010  . HERNIA REPAIR  2011, 2012  . INCISIONAL HERNIA REPAIR N/A 07/23/2013   Procedure: HERNIA REPAIR INCISIONAL WITH MESH;  Surgeon: Jamesetta So, MD;  Location: [facility];  Service: General;  Laterality: N/A;  . INSERTION OF MESH N/A 07/23/2013   Procedure: INSERTION OF MESH;  Surgeon: Jamesetta So, MD;  Location: [facility];  Service: General;  Laterality: N/A;  . LEFT HEART CATHETERIZATION WITH CORONARY/GRAFT ANGIOGRAM N/A 11/01/2013   Procedure: LEFT HEART CATHETERIZATION WITH Beatrix Fetters;  Surgeon: Blane Ohara, MD;  Location: [facility] CATH LAB;  Service: Cardiovascular;  Laterality: N/A;  . TOE AMPUTATION  1998   right 1st and 2nd toe  . TONSILECTOMY, ADENOIDECTOMY, BILATERAL MYRINGOTOMY AND TUBES    . TONSILLECTOMY    . VENTRAL HERNIA REPAIR N/A 10/28/2012   Procedure: LAPAROSCOPIC VENTRAL HERNIA;  Surgeon: Donato Heinz, MD;  Location: [facility];  Service: General;  Laterality: N/A;   Past Surgical History:  Procedure Laterality Date  . CHOLECYSTECTOMY     . CORONARY ARTERY BYPASS GRAFT  2010   LIMA to LAD, SVG to diagonal, SVG to OM1 and OM 2, SVG to RCA  . DENTAL SURGERY  2003  . GASTRIC BYPASS  2010  . HERNIA REPAIR  2011, 2012  . INCISIONAL HERNIA REPAIR N/A 07/23/2013   Procedure: HERNIA REPAIR INCISIONAL WITH MESH;  Surgeon: Jamesetta So, MD;  Location: [facility];  Service: General;  Laterality: N/A;  . INSERTION OF MESH N/A 07/23/2013   Procedure: INSERTION OF MESH;  Surgeon: Jamesetta So, MD;  Location: [facility];  Service: General;  Laterality: N/A;  . LEFT HEART CATHETERIZATION WITH CORONARY/GRAFT ANGIOGRAM N/A 11/01/2013   Procedure: LEFT HEART CATHETERIZATION WITH Beatrix Fetters;  Surgeon: Blane Ohara, MD;  Location: [facility] CATH LAB;  Service: Cardiovascular;  Laterality: N/A;  . TOE AMPUTATION  1998   right 1st and 2nd toe  . TONSILECTOMY, ADENOIDECTOMY, BILATERAL MYRINGOTOMY AND TUBES    . TONSILLECTOMY    . VENTRAL HERNIA REPAIR N/A 10/28/2012   Procedure: LAPAROSCOPIC VENTRAL HERNIA;  Surgeon: Donato Heinz, MD;  Location: [facility];  Service: General;  Laterality: N/A;   Past Medical History:  Diagnosis Date  . Anaphylaxis    IGE mediated  . Anxiety   . Arthritis   . Cardiomyopathy ([place])   . CHF (congestive heart failure) ([place])   . COPD (chronic obstructive pulmonary disease) ([place])   . Coronary atherosclerosis of native coronary artery    Multivessel status post CABG 2010  . Depression   . Essential hypertension   . History of CVA (cerebrovascular accident) 01/2012   Right posterior frontal cortical and subcortical brain by MRI, no hemorrhage. Carotid Dopplers showed only 1-50% bilateral ICA stenoses. Echocardiogram showed LVEF 50%, no major valvular abnormalities.  . Mixed hyperlipidemia   . Myocardial infarction ([place]) 2010  . OSA (obstructive sleep apnea)   . Stroke ([place])   . Type 2 diabetes mellitus (HCC)    BP 106/64   Pulse 68   Temp 98.7 F (37.1 C)   SpO2 94%   Opioid Risk Score:   Fall Risk  Score:  `1  Depression screen PHQ 2/9  Depression screen [facility] 2/9 04/07/2020 11/06/2018 08/14/2018 05/19/2018 09/29/2017  Decreased Interest 0 0 1 1 1   Down, Depressed, Hopeless 0 0 1 1 3   PHQ - 2 Score 0 0 2 2 4   Altered sleeping - - - - 3  Tired, decreased energy - - - - 2  Change in appetite - - - - 3  Feeling bad or failure about yourself  - - - - 3  Trouble concentrating - - - -  3  Moving slowly or fidgety/restless - - - - 2  Suicidal thoughts - - - - 0  PHQ-9 Score - - - - 20  Difficult doing work/chores - - - - Somewhat difficult  Some recent data might be hidden    Review of Systems  Constitutional: Negative.   HENT: Negative.   Eyes: Negative.   Respiratory: Negative.   Cardiovascular: Negative.   Gastrointestinal: Negative.   Endocrine: Negative.   Genitourinary: Negative.   Musculoskeletal: Positive for gait problem.  Skin: Negative.   Allergic/Immunologic: Negative.   Hematological: Negative.   Psychiatric/Behavioral: Negative.   All other systems reviewed and are negative.      Objective:   Physical Exam Vitals and nursing note reviewed.  Constitutional:      Appearance: He is obese.  HENT:     Head: Normocephalic and atraumatic.  Eyes:     Extraocular Movements: Extraocular movements intact.     Conjunctiva/sclera: Conjunctivae normal.     Pupils: Pupils are equal, round, and reactive to light.  Neurological:     Mental Status: He is alert and oriented to person, place, and time.     Comments: Left pectoralis MAS 2 Left elbow flexors MAS 1 Left thumb MAS 3 Left ankle plantar flexor MAS 4 With coughing and intense knee clonus is elicited.  There is contraction of both the quadriceps as well as hamstring muscle groups.  Psychiatric:        Mood and Affect: Mood normal.           Assessment & Plan:  #1.  Spastic hemiplegia left side due to right basal ganglia infarct, will make some adjustments to the botulinum toxin medication dosing. The  patient does have  left ankle contracture, we discussed referral to orthopedic foot and ankle specialist for possible Achilles tendon lengthening procedure He would like to talk to his wife about this. Will therefore not inject the gastroc or posterior tibialis on the left side next round of Dysport.  Will instead use that medication in the quad and hamstring area. Should the foot and ankle spasticity increase, may consider left tibial nerve neurolysis with phenol  Dysport  Left Quad 400U Left Hamstring 500U  Left Pec 100U  Left FDS 100 Left FDP 200U Left Opponens 100 Left Biceps 100U  Will reorder baclofen 20 mg twice daily for spasticity
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
## 2020-09-05 ENCOUNTER — Other Ambulatory Visit: Payer: Self-pay

## 2020-09-05 ENCOUNTER — Encounter: Payer: Self-pay | Admitting: Psychiatry

## 2020-09-05 ENCOUNTER — Telehealth (INDEPENDENT_AMBULATORY_CARE_PROVIDER_SITE_OTHER): Payer: Medicare HMO | Admitting: Psychiatry

## 2020-09-05 DIAGNOSIS — F3341 Major depressive disorder, recurrent, in partial remission: Secondary | ICD-10-CM

## 2020-09-05 DIAGNOSIS — F419 Anxiety disorder, unspecified: Secondary | ICD-10-CM

## 2020-09-05 MED ORDER — DULOXETINE HCL 60 MG PO CPEP: ORAL_CAPSULE | ORAL | 0 refills | Status: DC

## 2020-09-05 MED ORDER — DULOXETINE HCL 30 MG PO CPEP: ORAL_CAPSULE | ORAL | 0 refills | Status: DC

## 2020-09-05 NOTE — Patient Instructions (Signed)
1.Continueduloxetine 90 mg(60 mg + 30 mg)daily  2 ContinueXanax 0.5-1 mg daily as needed for anxiety 3.Next appointment:5/23 at 9 AM

## 2020-09-13 DIAGNOSIS — Z1389 Encounter for screening for other disorder: Secondary | ICD-10-CM | POA: Diagnosis not present

## 2020-09-13 DIAGNOSIS — E7849 Other hyperlipidemia: Secondary | ICD-10-CM | POA: Diagnosis not present

## 2020-09-13 DIAGNOSIS — Z7189 Other specified counseling: Secondary | ICD-10-CM | POA: Diagnosis not present

## 2020-09-13 DIAGNOSIS — E1122 Type 2 diabetes mellitus with diabetic chronic kidney disease: Secondary | ICD-10-CM | POA: Diagnosis not present

## 2020-09-13 DIAGNOSIS — I10 Essential (primary) hypertension: Secondary | ICD-10-CM | POA: Diagnosis not present

## 2020-09-13 DIAGNOSIS — Z1331 Encounter for screening for depression: Secondary | ICD-10-CM | POA: Diagnosis not present

## 2020-09-13 DIAGNOSIS — E119 Type 2 diabetes mellitus without complications: Secondary | ICD-10-CM | POA: Diagnosis not present

## 2020-09-13 DIAGNOSIS — J4530 Mild persistent asthma, uncomplicated: Secondary | ICD-10-CM | POA: Diagnosis not present

## 2020-09-13 DIAGNOSIS — I509 Heart failure, unspecified: Secondary | ICD-10-CM | POA: Diagnosis not present

## 2020-09-13 DIAGNOSIS — E8881 Metabolic syndrome: Secondary | ICD-10-CM | POA: Diagnosis not present

## 2020-09-19 DIAGNOSIS — F1120 Opioid dependence, uncomplicated: Secondary | ICD-10-CM | POA: Diagnosis not present

## 2020-09-19 DIAGNOSIS — Z6837 Body mass index (BMI) 37.0-37.9, adult: Secondary | ICD-10-CM | POA: Diagnosis not present

## 2020-09-19 DIAGNOSIS — Z79899 Other long term (current) drug therapy: Secondary | ICD-10-CM | POA: Diagnosis not present

## 2020-09-19 DIAGNOSIS — M79662 Pain in left lower leg: Secondary | ICD-10-CM | POA: Diagnosis not present

## 2020-09-19 DIAGNOSIS — M25512 Pain in left shoulder: Secondary | ICD-10-CM | POA: Diagnosis not present

## 2020-09-19 DIAGNOSIS — G894 Chronic pain syndrome: Secondary | ICD-10-CM | POA: Diagnosis not present

## 2020-09-19 DIAGNOSIS — F17210 Nicotine dependence, cigarettes, uncomplicated: Secondary | ICD-10-CM | POA: Diagnosis not present

## 2020-09-28 DIAGNOSIS — E559 Vitamin D deficiency, unspecified: Secondary | ICD-10-CM | POA: Diagnosis not present

## 2020-09-28 DIAGNOSIS — Z79899 Other long term (current) drug therapy: Secondary | ICD-10-CM | POA: Diagnosis not present

## 2020-09-28 DIAGNOSIS — R635 Abnormal weight gain: Secondary | ICD-10-CM | POA: Diagnosis not present

## 2020-09-28 DIAGNOSIS — E1165 Type 2 diabetes mellitus with hyperglycemia: Secondary | ICD-10-CM | POA: Diagnosis not present

## 2020-09-29 ENCOUNTER — Ambulatory Visit (INDEPENDENT_AMBULATORY_CARE_PROVIDER_SITE_OTHER): Payer: Medicare HMO | Admitting: Urology

## 2020-09-29 ENCOUNTER — Encounter: Payer: Self-pay | Admitting: Urology

## 2020-09-29 ENCOUNTER — Telehealth: Payer: Self-pay | Admitting: Cardiology

## 2020-09-29 ENCOUNTER — Other Ambulatory Visit: Payer: Self-pay

## 2020-09-29 VITALS — BP 95/68 | HR 94 | Temp 98.2°F | Ht 71.0 in | Wt 251.0 lb

## 2020-09-29 DIAGNOSIS — R3912 Poor urinary stream: Secondary | ICD-10-CM | POA: Diagnosis not present

## 2020-09-29 DIAGNOSIS — N401 Enlarged prostate with lower urinary tract symptoms: Secondary | ICD-10-CM | POA: Diagnosis not present

## 2020-09-29 DIAGNOSIS — N138 Other obstructive and reflux uropathy: Secondary | ICD-10-CM | POA: Diagnosis not present

## 2020-09-29 DIAGNOSIS — R972 Elevated prostate specific antigen [PSA]: Secondary | ICD-10-CM

## 2020-09-29 LAB — MICROSCOPIC EXAMINATION
Epithelial Cells (non renal): NONE SEEN /hpf (ref 0–10)
Renal Epithel, UA: NONE SEEN /hpf
WBC, UA: NONE SEEN /hpf (ref 0–5)

## 2020-09-29 LAB — URINALYSIS, ROUTINE W REFLEX MICROSCOPIC
Bilirubin, UA: NEGATIVE
Glucose, UA: NEGATIVE
Ketones, UA: NEGATIVE
Leukocytes,UA: NEGATIVE
Nitrite, UA: NEGATIVE
Specific Gravity, UA: 1.025 (ref 1.005–1.030)
Urobilinogen, Ur: 1 mg/dL (ref 0.2–1.0)
pH, UA: 6 (ref 5.0–7.5)

## 2020-09-29 LAB — BLADDER SCAN AMB NON-IMAGING: Scan Result: 0

## 2020-09-29 MED ORDER — LEVOFLOXACIN 750 MG PO TABS: 750.0000 mg | ORAL_TABLET | Freq: Once | ORAL | 0 refills | Status: AC

## 2020-09-29 MED ORDER — LEVOFLOXACIN 750 MG PO TABS: 750.0000 mg | ORAL_TABLET | Freq: Every day | ORAL | 0 refills | Status: DC

## 2020-09-29 NOTE — Patient Instructions (Addendum)
[facility] (11th ed., pp. [phone]). [place], [place]: Elsevier.">  Transrectal Ultrasound-Guided Prostate Biopsy This is a procedure to take samples of tissue from your prostate. Ultrasound images are used to guide the procedure. It is usually done to check for prostate cancer. What happens before the procedure? Eating and drinking restrictions Follow instructions from your doctor about what you can eat or drink. Medicines  Ask your doctor about changing or stopping: ? Your normal medicines. ? Vitamins, herbs, and supplements. ? Over-the-counter medicines.  Do not take aspirin or ibuprofen unless you are told to. General instructions  Liquid will be used to clear waste from your butt (enema).  You may have a blood sample taken.  You may have a pee (urine) sample taken.  Plan to have a responsible adult take you home from the hospital or clinic.  If you will be going home right after the procedure, plan to have a responsible adult care for you for the time you are told. This is important.  For your safety, your doctor may: ? Ask you to wash with a soap that kills germs. ? Give you antibiotic medicine. What happens during the procedure?  You will be given one or both of these: ? A medicine to help you relax. ? A medicine to numb the area.  You will be placed on your left side. Your knees may be bent.  A probe with gel on it will be placed in your butt. Pictures will be taken of your prostate and the area around it.  Medicine will be used to numb your prostate.  A needle will be placed in your butt and moved to your prostate.  Prostate tissue will be removed.  The samples will be sent to a lab. The procedure may vary.   What happens after the procedure?  You will be watched until the medicines you were given have worn off.  You may have some pain in your butt. You will be given medicine for it.  Do not drive for 24 hours if you were given a medicine  to help you relax. Summary  This procedure is usually done to check for prostate cancer.  Before the procedure, ask your doctor about changing or stopping your medicines.  You may have some pain in your butt. You will be given medicine for it.  Plan to have a responsible adult take you home from the hospital or clinic. This information is not intended to replace advice given to you by your health care provider. Make sure you discuss any questions you have with your health care provider. Document Revised: 04/14/2020 Document Reviewed: 03/17/2020 Elsevier Patient Education  [place].        Appointment Time: 12:30p Please arrive by 12:15 Appointment Date: 11/01/20  Location: [facility]   Prostate Biopsy Instructions  Stop all aspirin or blood thinners (aspirin, plavix, coumadin, warfarin, motrin, ibuprofen, advil, aleve, naproxen, naprosyn) for 7 days prior to the procedure.  If you have any questions about stopping these medications, please contact your primary care physician or cardiologist.  Having a light meal prior to the procedure is recommended.  If you are diabetic or have low blood sugar please bring a small snack or glucose tablet.  A Fleets enema is needed to be purchased over the counter at a local pharmacy and used 2 hours before you scheduled appointment.  This can be purchased over the counter at any pharmacy.  Antibiotics will be administered in the clinic  at the time of the procedure and 1 tablet has been sent to your pharmacy. Please take the antibiotic as prescribed.    Please bring someone with you to the procedure to drive you home if you are given a valium to take prior to your procedure.   If you have any questions or concerns, please feel free to call the office at [phone] or send a Mychart message.    Thank you, [facility]

## 2020-09-29 NOTE — Telephone Encounter (Signed)
   Patient has appt 10/03/20 1140a     [facility] Pre-operative Risk Assessment    HEARTCARE STAFF: - Please ensure there is not already an duplicate clearance open for this procedure. - Under Visit Info/Reason for Call, type in Other and utilize the format Clearance MM/DD/YY or Clearance TBD. Do not use dashes or single digits. - If request is for dental extraction, please clarify the # of teeth to be extracted.  Request for surgical clearance:  1. What type of surgery is being performed? Prostate biopsy   2. When is this surgery scheduled? 11/01/20  3. What type of clearance is required (medical clearance vs. Pharmacy clearance to hold med vs. Both)? Medical   4. Are there any medications that need to be held prior to surgery and how long? Hold meds 5 days prior to surgery  5. Practice name and name of physician performing surgery? Dr Alyson Ingles  6. What is the office phone number? [phone]   7.   What is the office fax number?  [phone]   8.   Anesthesia type (None, local, MAC, general) ? general   Jannet Askew 09/29/2020, 2:57 PM  _________________________________________________________________   (provider comments below)

## 2020-09-29 NOTE — Telephone Encounter (Signed)
Error

## 2020-09-29 NOTE — Progress Notes (Signed)
09/29/2020 1:29 PM   [name] Jan 06, 1966 [id number]  Referring provider: Caryl Bis, MD [place],  [place] [address]  Elevated PSa and BPH  HPI: [name] is a 55yo here for followup for elevated PSA and BPH. PSA increased to to 19.8 from 14.9. NO bone pain. His uncles on his mothers side and 6 of his uncles were diagnosed with prostate cancer. He notes no change in his LUTS on flomax 0.4mg  daily. PVR 0 which is significantly improved. Nocturia 1x.    PMH: Past Medical History:  Diagnosis Date  . Anaphylaxis    IGE mediated  . Anxiety   . Arthritis   . Cardiomyopathy ([place])   . CHF (congestive heart failure) ([place])   . COPD (chronic obstructive pulmonary disease) ([place])   . Coronary atherosclerosis of native coronary artery    Multivessel status post CABG 2010  . Depression   . Essential hypertension   . History of CVA (cerebrovascular accident) 01/2012   Right posterior frontal cortical and subcortical brain by MRI, no hemorrhage. Carotid Dopplers showed only 1-50% bilateral ICA stenoses. Echocardiogram showed LVEF 50%, no major valvular abnormalities.  . Mixed hyperlipidemia   . Myocardial infarction ([place]) 2010  . OSA (obstructive sleep apnea)   . Stroke ([place])   . Type 2 diabetes mellitus [facility])     Surgical History: Past Surgical History:  Procedure Laterality Date  . CHOLECYSTECTOMY    . CORONARY ARTERY BYPASS GRAFT  2010   LIMA to LAD, SVG to diagonal, SVG to OM1 and OM 2, SVG to RCA  . DENTAL SURGERY  2003  . GASTRIC BYPASS  2010  . HERNIA REPAIR  2011, 2012  . INCISIONAL HERNIA REPAIR N/A 07/23/2013   Procedure: HERNIA REPAIR INCISIONAL WITH MESH;  Surgeon: Jamesetta So, MD;  Location: [facility];  Service: General;  Laterality: N/A;  . INSERTION OF MESH N/A 07/23/2013   Procedure: INSERTION OF MESH;  Surgeon: Jamesetta So, MD;  Location: [facility];  Service: General;  Laterality: N/A;  . LEFT HEART CATHETERIZATION WITH CORONARY/GRAFT ANGIOGRAM  N/A 11/01/2013   Procedure: LEFT HEART CATHETERIZATION WITH Beatrix Fetters;  Surgeon: Blane Ohara, MD;  Location: [facility] CATH LAB;  Service: Cardiovascular;  Laterality: N/A;  . TOE AMPUTATION  1998   right 1st and 2nd toe  . TONSILECTOMY, ADENOIDECTOMY, BILATERAL MYRINGOTOMY AND TUBES    . TONSILLECTOMY    . VENTRAL HERNIA REPAIR N/A 10/28/2012   Procedure: LAPAROSCOPIC VENTRAL HERNIA;  Surgeon: Donato Heinz, MD;  Location: [facility];  Service: General;  Laterality: N/A;    Home Medications:  Allergies as of 09/29/2020      Reactions   Contrast Media [iodinated Diagnostic Agents] Anaphylaxis, Shortness Of Breath, Swelling, Rash   Isovue contrast is most acceptable agent based on previous experience and testing with premedications   Ibuprofen Anaphylaxis, Hives, Swelling   Nsaids Anaphylaxis, Hives, Swelling, Rash   Can take Aspirin 325 mg or lower   Other Other (See Comments)   Anti-physcotics Personality change      Medication List       Accurate as of September 29, 2020  1:29 PM. If you have any questions, ask your nurse or doctor.        albuterol 108 (90 Base) MCG/ACT inhaler Commonly known as: VENTOLIN HFA Inhale 2 puffs into the lungs every 6 (six) hours as needed for wheezing or shortness of breath.   ALPRAZolam 1 MG tablet Commonly  known as: XANAX 0.5-1 mg daily as needed for anxiety   aspirin EC 81 MG tablet Take 1 tablet (81 mg total) by mouth daily.   atorvastatin 40 MG tablet Commonly known as: LIPITOR Take 1 tablet by mouth daily.   baclofen 20 MG tablet Commonly known as: LIORESAL Take 1 tablet (20 mg total) by mouth 2 (two) times daily.   carvedilol 6.25 MG tablet Commonly known as: COREG Take 6.25 mg by mouth 2 (two) times daily with a meal.   clopidogrel 75 MG tablet Commonly known as: PLAVIX Take 1 tablet by mouth daily.   dapsone 25 MG tablet   diltiazem 240 MG 24 hr capsule Commonly known as: CARDIZEM CD Take 240 mg by mouth  daily.   DULoxetine 60 MG capsule Commonly known as: CYMBALTA Take 90 mg daily (60 mg + 30 mg)   DULoxetine 30 MG capsule Commonly known as: CYMBALTA Take 90 mg daily (60 mg + 30 mg)   Entresto 49-51 MG Generic drug: sacubitril-valsartan TAKE (1) TABLET TWICE DAILY.   fenofibrate micronized 134 MG capsule Commonly known as: LOFIBRA Take 134 mg by mouth daily before breakfast.   fluticasone 0.05 % cream Commonly known as: CUTIVATE Apply 1 application topically 2 (two) times daily.   furosemide 20 MG tablet Commonly known as: LASIX Take 20 mg by mouth daily.   gabapentin 600 MG tablet Commonly known as: NEURONTIN Take 1 tablet by mouth 2 (two) times daily.   isosorbide mononitrate 60 MG 24 hr tablet Commonly known as: IMDUR Take 60 mg by mouth 2 (two) times daily.   nitroGLYCERIN 0.4 MG SL tablet Commonly known as: NITROSTAT Place 0.4 mg under the tongue every 5 (five) minutes as needed. For chest pain   oxyCODONE 5 MG immediate release tablet Commonly known as: Oxy IR/ROXICODONE Take 5 mg by mouth every 6 (six) hours as needed for severe pain.   oxyCODONE-acetaminophen 7.5-325 MG tablet Commonly known as: PERCOCET   silver sulfADIAZINE 1 % cream Commonly known as: SILVADENE   tamsulosin 0.4 MG Caps capsule Commonly known as: FLOMAX Take 1 capsule (0.4 mg total) by mouth daily after supper.   tiZANidine 2 MG tablet Commonly known as: ZANAFLEX Take 2 mg by mouth once.   traZODone 50 MG tablet Commonly known as: DESYREL 25-50 mg at night as needed for sleep   triamcinolone 0.1 % Commonly known as: KENALOG Apply 1 application topically 2 (two) times daily.   Vitamin D 50 MCG (2000 UT) tablet Take 2,000 Units by mouth daily at 12 noon.       Allergies:  Allergies  Allergen Reactions  . Contrast Media [Iodinated Diagnostic Agents] Anaphylaxis, Shortness Of Breath, Swelling and Rash    Isovue contrast is most acceptable agent based on previous  experience and testing with premedications  . Ibuprofen Anaphylaxis, Hives and Swelling  . Nsaids Anaphylaxis, Hives, Swelling and Rash    Can take Aspirin 325 mg or lower  . Other Other (See Comments)    Anti-physcotics Personality change    Family History: Family History  Problem Relation Age of Onset  . Lung cancer Father   . Heart disease Father   . Heart disease Mother   . Congestive Heart Failure Mother   . Diabetes Mother   . Hyperlipidemia Mother   . Alcohol abuse Brother   . Breast cancer Maternal Aunt   . Suicidality Cousin     Social History:  reports that he has been smoking cigarettes. He started  smoking about 38 years ago. He has a 45.00 pack-year smoking history. He has never used smokeless tobacco. He reports that he does not drink alcohol and does not use drugs.  ROS: All other review of systems were reviewed and are negative except what is noted above in HPI  Physical Exam: There were no vitals taken for this visit.  Constitutional:  Alert and oriented, No acute distress. HEENT: [place] AT, moist mucus membranes.  Trachea midline, no masses. Cardiovascular: No clubbing, cyanosis, or edema. Respiratory: Normal respiratory effort, no increased work of breathing. GI: Abdomen is soft, nontender, nondistended, no abdominal masses GU: No CVA tenderness.  Lymph: No cervical or inguinal lymphadenopathy. Skin: No rashes, bruises or suspicious lesions. Neurologic: Grossly intact, no focal deficits, moving all 4 extremities. Psychiatric: Normal mood and affect.  Laboratory Data: Lab Results  Component Value Date   WBC 13.4 (H) 07/28/2017   HGB 14.9 07/28/2017   HCT 44.6 07/28/2017   MCV 86.8 07/28/2017   PLT 254 07/28/2017    Lab Results  Component Value Date   CREATININE 1.09 07/28/2017    No results found for: PSA  No results found for: TESTOSTERONE  Lab Results  Component Value Date   HGBA1C 6.0 (H) 10/31/2013    Urinalysis    Component Value  Date/Time   COLORURINE YELLOW 06/21/2013 2323   APPEARANCEUR CLEAR 06/21/2013 2323   LABSPEC 1.020 06/21/2013 2323   PHURINE 7.0 06/21/2013 2323   GLUCOSEU NEGATIVE 06/21/2013 2323   HGBUR TRACE (A) 06/21/2013 2323   BILIRUBINUR NEGATIVE 06/21/2013 2323   KETONESUR NEGATIVE 06/21/2013 2323   PROTEINUR NEGATIVE 06/21/2013 2323   UROBILINOGEN 0.2 06/21/2013 2323   NITRITE NEGATIVE 06/21/2013 2323   LEUKOCYTESUR NEGATIVE 06/21/2013 2323    Lab Results  Component Value Date   BACTERIA FEW (A) 06/21/2013    Pertinent Imaging:  Results for orders placed during the hospital encounter of 02/19/07  DG Abd 1 View  Narrative History: Left renal calculus, status post lithotripsy  ABDOMEN ONE VIEW:  Tiny residual stone fragment inferior pole left kidney, 1.5 mm diameter. No distal ureteral calculi evident. Tiny phleboliths in pelvis bilaterally stable. Bones unremarkable. Bowel gas pattern normal.  IMPRESSION: Single tiny residual stone fragment inferior pole left kidney.  Provider: Vaughan Basta, [place]  No results found for this or any previous visit.  No results found for this or any previous visit.  No results found for this or any previous visit.  No results found for this or any previous visit.  No results found for this or any previous visit.  No results found for this or any previous visit.  No results found for this or any previous visit.   Assessment & Plan:    1. Elevated PSA The patient and I talked about etiologies of elevated PSA.  We discussed the possible relationship between elevated PSA, prostate cancer, BPH, prostatitis, and UTI.   Conservative treatment of elevated PSA with watchful waiting was discussed with the patient.  All questions were answered.        All of the risks and benefits along with alternatives to prostate biopsy were discussed with the patient.  The patient gave fully informed consent to proceed with a transrectal ultrasound  guided biopsy of the prostate for the evaluation of their evated PSA.  Prostate biopsy instructions and antibiotics were given to the patient.  - Urinalysis, Routine w reflex microscopic - BLADDER SCAN AMB NON-IMAGING  2. Weak urinary stream -continue flomax  Return in about 4 weeks (around 10/27/2020) for prostate biopsy .  Nicolette Bang, MD  [facility] [place]

## 2020-09-29 NOTE — Progress Notes (Signed)
Bladder Scan Patient can void: 0 ml Performed By: Durenda Guthrie, lpn   Urological Symptom Review  Patient is experiencing the following symptoms: Leakage of urine Trouble starting stream Have to strain to urinate   Review of Systems  Gastrointestinal (upper)  : Negative for upper GI symptoms  Gastrointestinal (lower) : Negative for lower GI symptoms  Constitutional : Negative for symptoms  Skin: Skin rash/lesion  Eyes: Negative for eye symptoms  Ear/Nose/Throat : Sinus problems  Hematologic/Lymphatic: Negative for Hematologic/Lymphatic symptoms  Cardiovascular : Leg swelling  Respiratory : Cough  Endocrine: Excessive thirst  Musculoskeletal: Back pain  Neurological: Headaches  Psychologic: Negative for psychiatric symptoms

## 2020-10-02 NOTE — Telephone Encounter (Signed)
   Primary Cardiologist: Rozann Lesches, MD  Chart reviewed as part of pre-operative protocol coverage.  Patient already has a pre-op appointment scheduled for 3/22. Per office protocol, the provider should assess clearance at time of office visit and should forward their finalized clearance decision to requesting party below. I will remove this message from the pre-op box.   Charlie Pitter, PA-C 10/02/2020, 11:51 AM

## 2020-10-03 ENCOUNTER — Ambulatory Visit (INDEPENDENT_AMBULATORY_CARE_PROVIDER_SITE_OTHER): Payer: Medicare HMO | Admitting: Cardiology

## 2020-10-03 ENCOUNTER — Encounter: Payer: Self-pay | Admitting: Cardiology

## 2020-10-03 VITALS — BP 120/80 | HR 80 | Wt 248.6 lb

## 2020-10-03 DIAGNOSIS — I25119 Atherosclerotic heart disease of native coronary artery with unspecified angina pectoris: Secondary | ICD-10-CM | POA: Diagnosis not present

## 2020-10-03 DIAGNOSIS — Z01810 Encounter for preprocedural cardiovascular examination: Secondary | ICD-10-CM | POA: Diagnosis not present

## 2020-10-03 DIAGNOSIS — E782 Mixed hyperlipidemia: Secondary | ICD-10-CM | POA: Diagnosis not present

## 2020-10-03 DIAGNOSIS — I255 Ischemic cardiomyopathy: Secondary | ICD-10-CM

## 2020-10-03 NOTE — Patient Instructions (Addendum)

## 2020-10-03 NOTE — Progress Notes (Signed)
Cardiology Office Note  Date: 10/03/2020   ID: [name], DOB [birth year], MRN [id number]  PCP:  [name] Bis, MD  Cardiologist:  [name] Lesches, MD Electrophysiologist:  None   Chief Complaint  Patient presents with   Cardiac follow-up    History of Present Illness: [name] is a 55 y.o. male last seen in October 2021.  He presents for a follow-up visit.  Reports no active angina symptoms with low-level activity.  Tolerating medical therapy well.  Patient is being considered for prostate biopsy by Dr. Alyson [name] under local anesthesia.  It is requested that he hold Plavix 5 days prior to the procedure.  This scheduled for next month.  He was found to have an elevated PSA, up to 19.8.  Follow-up echocardiogram in November 2021 revealed LVEF approximately 45% with wall motion abnormalities consistent with ischemic cardiomyopathy, normal RV contraction.  LV function is stable.  I reviewed his current cardiac regimen which is outlined below.  Today's ECG is consistent with old anterior infarct.   Past Medical History:  Diagnosis Date   Anaphylaxis    IGE mediated   Anxiety    Arthritis    Cardiomyopathy ([place])    CHF (congestive heart failure) (HCC)    COPD (chronic obstructive pulmonary disease) ([place])    Coronary atherosclerosis of native coronary artery    Multivessel status post CABG 2010   Depression    Essential hypertension    History of CVA (cerebrovascular accident) 01/2012   Right posterior frontal cortical and subcortical brain by MRI, no hemorrhage. Carotid Dopplers showed only 1-50% bilateral ICA stenoses. Echocardiogram showed LVEF 50%, no major valvular abnormalities.   Mixed hyperlipidemia    Myocardial infarction ([place]) 2010   OSA (obstructive sleep apnea)    Stroke ([place])    Type 2 diabetes mellitus ([place])     Past Surgical History:  Procedure Laterality Date   CHOLECYSTECTOMY     CORONARY ARTERY BYPASS GRAFT   2010   LIMA to LAD, SVG to diagonal, SVG to OM1 and OM 2, SVG to RCA   DENTAL SURGERY  2003   GASTRIC BYPASS  2010   HERNIA REPAIR  2011, 2012   [place] N/A 07/23/2013   Procedure: HERNIA REPAIR INCISIONAL WITH MESH;  Surgeon: [name] So, MD;  Location: [facility];  Service: General;  Laterality: N/A;   INSERTION OF MESH N/A 07/23/2013   Procedure: INSERTION OF MESH;  Surgeon: [name] So, MD;  Location: [facility];  Service: General;  Laterality: N/A;   LEFT HEART CATHETERIZATION WITH CORONARY/GRAFT ANGIOGRAM N/A 11/01/2013   Procedure: LEFT HEART CATHETERIZATION WITH Beatrix Fetters;  Surgeon: [name] Ohara, MD;  Location: [facility] CATH LAB;  Service: Cardiovascular;  Laterality: N/A;   TOE AMPUTATION  1998   right 1st and 2nd toe   TONSILECTOMY, ADENOIDECTOMY, BILATERAL MYRINGOTOMY AND TUBES     TONSILLECTOMY     VENTRAL HERNIA REPAIR N/A 10/28/2012   Procedure: LAPAROSCOPIC VENTRAL HERNIA;  Surgeon: [name] Heinz, MD;  Location: [facility];  Service: General;  Laterality: N/A;    Current Outpatient Medications  Medication Sig Dispense Refill   albuterol (PROVENTIL HFA;VENTOLIN HFA) 108 (90 BASE) MCG/ACT inhaler Inhale 2 puffs into the lungs every 6 (six) hours as needed for wheezing or shortness of breath.     ALPRAZolam (XANAX) 1 MG tablet 0.5-1 mg daily as needed for anxiety 30 tablet 2   aspirin EC 81 MG tablet Take 1 tablet (  81 mg total) by mouth daily. 90 tablet 3   atorvastatin (LIPITOR) 40 MG tablet Take 1 tablet by mouth daily.     baclofen (LIORESAL) 20 MG tablet Take 1 tablet (20 mg total) by mouth 2 (two) times daily. 60 tablet 5   carvedilol (COREG) 6.25 MG tablet Take 6.25 mg by mouth 2 (two) times daily with a meal.     Cholecalciferol (VITAMIN D) 2000 units tablet Take 2,000 Units by mouth daily at 12 noon.     clopidogrel (PLAVIX) 75 MG tablet Take 1 tablet by mouth daily.     dapsone 25 MG tablet      diltiazem (CARDIZEM CD) 240 MG  24 hr capsule Take 240 mg by mouth daily.      DULoxetine (CYMBALTA) 30 MG capsule Take 90 mg daily (60 mg + 30 mg) 90 capsule 0   DULoxetine (CYMBALTA) 60 MG capsule Take 90 mg daily (60 mg + 30 mg) 90 capsule 0   ENTRESTO 49-51 MG TAKE (1) TABLET TWICE DAILY. 60 tablet 3   fenofibrate micronized (LOFIBRA) 134 MG capsule Take 134 mg by mouth daily before breakfast.      fluticasone (CUTIVATE) 0.05 % cream Apply 1 application topically 2 (two) times daily.     furosemide (LASIX) 20 MG tablet Take 20 mg by mouth daily.      gabapentin (NEURONTIN) 600 MG tablet Take 1 tablet by mouth 2 (two) times daily.      isosorbide mononitrate (IMDUR) 60 MG 24 hr tablet Take 60 mg by mouth 2 (two) times daily.      levofloxacin (LEVAQUIN) 750 MG tablet Take 1 tablet (750 mg total) by mouth daily. 1 tablet 0   nitroGLYCERIN (NITROSTAT) 0.4 MG SL tablet Place 0.4 mg under the tongue every 5 (five) minutes as needed. For chest pain     oxyCODONE (OXY IR/ROXICODONE) 5 MG immediate release tablet Take 5 mg by mouth every 6 (six) hours as needed for severe pain.      oxyCODONE-acetaminophen (PERCOCET) 7.5-325 MG tablet      Semaglutide, 1 MG/DOSE, 2 MG/1.5ML SOPN Inject into the skin once a week.     silver sulfADIAZINE (SILVADENE) 1 % cream      tamsulosin (FLOMAX) 0.4 MG CAPS capsule Take 1 capsule (0.4 mg total) by mouth daily after supper. 30 capsule 11   tiZANidine (ZANAFLEX) 2 MG tablet Take 2 mg by mouth once.      traZODone (DESYREL) 50 MG tablet 25-50 mg at night as needed for sleep 30 tablet 2   triamcinolone cream (KENALOG) 0.1 % Apply 1 application topically 2 (two) times daily.      No current facility-administered medications for this visit.   Allergies:  Contrast media [iodinated diagnostic agents], Ibuprofen, Nsaids, and Other   ROS: No palpitations or syncope.  Physical Exam: VS:  BP 120/80 (BP Location: Right Arm, Patient Position: Sitting, Cuff Size: Large)    Pulse 80     Wt 248 lb 9.6 oz (112.8 kg)    SpO2 96%    BMI 34.67 kg/m , BMI Body mass index is 34.67 kg/m.  Wt Readings from Last 3 Encounters:  10/03/20 248 lb 9.6 oz (112.8 kg)  09/29/20 251 lb (113.9 kg)  07/24/20 255 lb (115.7 kg)    General: Patient appears comfortable at rest.  Seated in wheelchair. HEENT: Conjunctiva and lids normal, wearing a mask. Neck: Supple, no elevated JVP or carotid bruits, no thyromegaly. Lungs: Clear to auscultation,  nonlabored breathing at rest. Cardiac: Regular rate and rhythm, no S3, soft systolic murmur, no pericardial rub. Extremities: No pitting edema, leg brace as before.  ECG:  An ECG dated 11/05/2019 was personally reviewed today and demonstrated:  Sinus rhythm with low voltage, probable old anteroseptal infarct pattern, nonspecific ST-T changes.  Recent Labwork:  February 2021: Cholesterol 132, triglycerides 129, HDL 31, LDL 78, BUN 14, creatinine 1.27, potassium 4.2, AST 34, ALT 31, TSH 1.69, hemoglobin 14.6, platelets 221 February 2022: Hgb A1c 5.6%, PSA 19.8, BUN 20, creatinine 1.55, potassium 3.8, AST 15, ALT 23, cholesterol 128, TG 142, HDL 30, LDL 73  Other Studies Reviewed Today:  Echocardiogram 05/25/2020: 1. Very limited images. Suggest Definity contrast with next study.  2. Left ventricular ejection fraction, by estimation, is approximately  45%. The left ventricle has mildly decreased function. Left ventricular  endocardial border not optimally defined to evaluate regional wall motion,  but there anteroseptal hypokinesis  to akinesis noted. There is mild left ventricular hypertrophy. Left  ventricular diastolic parameters were normal.  3. Right ventricular systolic function is normal. The right ventricular  size is normal.  4. The mitral valve is grossly normal. Trivial mitral valve  regurgitation.  5. The aortic valve is tricuspid. Aortic valve regurgitation is trivial.  6. Unable to estimate CVP.   Assessment and Plan:  1.   Multivessel CAD status post CABG with patent bypass grafts in 2015 and continued medical therapy in the absence of angina symptoms.  ECG reviewed and stable.  He is on aspirin, Plavix, Lipitor, Coreg, Imdur, Entresto, and as needed nitroglycerin.  2.  Plan for prostate biopsy with local anesthesia per Dr. Alyson [name] as discussed above.  Reasonable to hold Plavix 5 days prior.  Should not require any further cardiac testing prior to proceeding.  3.  Ischemic cardiomyopathy, LVEF 45% and stable by echocardiogram in November 2021.  He remains on low-dose Lasix in addition to above medications.  4.  Mixed hyperlipidemia, continues on Lipitor with recent LDL 73.  Medication Adjustments/Labs and Tests Ordered: Current medicines are reviewed at length with the patient today.  Concerns regarding medicines are outlined above.   Tests Ordered: Orders Placed This Encounter  Procedures   EKG 12-Lead    Medication Changes: No orders of the defined types were placed in this encounter.   Disposition:  Follow up 6 months in the [place] office.  Signed, Satira Sark, MD, [facility] 10/03/2020 12:12 PM    [facility] [place], [place], [place] [address] Phone: [phone]; Fax: [phone]

## 2020-10-05 DIAGNOSIS — R262 Difficulty in walking, not elsewhere classified: Secondary | ICD-10-CM | POA: Diagnosis not present

## 2020-10-09 ENCOUNTER — Telehealth: Payer: Self-pay

## 2020-10-09 NOTE — Telephone Encounter (Signed)
Pt called saying he had gotten a copy of a letter stating he has clearance of Plavix before his biopsy. He wanted to know if we needed it. A copy is in chart. Pt also asked if he did enema at the hospital before the biopsy or at home. I explained it was to be done two hours before at home.

## 2020-10-09 NOTE — Telephone Encounter (Signed)
Patient wants to know if he needs to bring a letter saying he is clear. Unsure of what patient was asking in voice message.  Please call pt back at [phone].  Thanks, Helene Kelp

## 2020-10-10 ENCOUNTER — Encounter: Payer: Self-pay | Admitting: Physical Medicine & Rehabilitation

## 2020-10-10 ENCOUNTER — Other Ambulatory Visit: Payer: Self-pay

## 2020-10-10 ENCOUNTER — Encounter: Payer: Medicare HMO | Attending: Physical Medicine & Rehabilitation | Admitting: Physical Medicine & Rehabilitation

## 2020-10-10 VITALS — BP 103/65 | HR 75 | Temp 98.0°F | Ht 71.0 in | Wt 255.0 lb

## 2020-10-10 DIAGNOSIS — J449 Chronic obstructive pulmonary disease, unspecified: Secondary | ICD-10-CM | POA: Diagnosis not present

## 2020-10-10 DIAGNOSIS — I2510 Atherosclerotic heart disease of native coronary artery without angina pectoris: Secondary | ICD-10-CM | POA: Insufficient documentation

## 2020-10-10 DIAGNOSIS — G8110 Spastic hemiplegia affecting unspecified side: Secondary | ICD-10-CM | POA: Diagnosis not present

## 2020-10-10 DIAGNOSIS — I10 Essential (primary) hypertension: Secondary | ICD-10-CM | POA: Insufficient documentation

## 2020-10-10 DIAGNOSIS — G8114 Spastic hemiplegia affecting left nondominant side: Secondary | ICD-10-CM | POA: Insufficient documentation

## 2020-10-10 DIAGNOSIS — G4730 Sleep apnea, unspecified: Secondary | ICD-10-CM | POA: Insufficient documentation

## 2020-10-10 DIAGNOSIS — I429 Cardiomyopathy, unspecified: Secondary | ICD-10-CM | POA: Diagnosis not present

## 2020-10-10 NOTE — Progress Notes (Signed)
Dysport Injection for spasticity using needle EMG guidance  Dilution: 200 Units/ml Indication: Severe spasticity which interferes with ADL,mobility and/or  hygiene and is unresponsive to medication management and other conservative care Informed consent was obtained after describing risks and benefits of the procedure with the patient. This includes bleeding, bruising, infection, excessive weakness, or medication side effects. A REMS form is on file and signed. Needle:  needle electrode Number of units per muscle Dysport  Left Quad 400U Left Hamstring 500U  Left Pec 100U  Left FDS 100 Left FDP 200U Left Opponens 100 Left Biceps 100U All injections were done after obtaining appropriate EMG activity and after negative drawback for blood. The patient tolerated the procedure well. Post procedure instructions were given. A followup appointment was made.

## 2020-10-10 NOTE — Patient Instructions (Signed)

## 2020-10-26 DIAGNOSIS — F1120 Opioid dependence, uncomplicated: Secondary | ICD-10-CM | POA: Diagnosis not present

## 2020-10-26 DIAGNOSIS — M25512 Pain in left shoulder: Secondary | ICD-10-CM | POA: Diagnosis not present

## 2020-10-26 DIAGNOSIS — F17210 Nicotine dependence, cigarettes, uncomplicated: Secondary | ICD-10-CM | POA: Diagnosis not present

## 2020-10-26 DIAGNOSIS — M79662 Pain in left lower leg: Secondary | ICD-10-CM | POA: Diagnosis not present

## 2020-10-26 DIAGNOSIS — G894 Chronic pain syndrome: Secondary | ICD-10-CM | POA: Diagnosis not present

## 2020-10-26 DIAGNOSIS — Z6837 Body mass index (BMI) 37.0-37.9, adult: Secondary | ICD-10-CM | POA: Diagnosis not present

## 2020-10-26 DIAGNOSIS — E1165 Type 2 diabetes mellitus with hyperglycemia: Secondary | ICD-10-CM | POA: Diagnosis not present

## 2020-10-30 DIAGNOSIS — E559 Vitamin D deficiency, unspecified: Secondary | ICD-10-CM | POA: Diagnosis not present

## 2020-10-30 DIAGNOSIS — E1165 Type 2 diabetes mellitus with hyperglycemia: Secondary | ICD-10-CM | POA: Diagnosis not present

## 2020-10-30 DIAGNOSIS — Z79899 Other long term (current) drug therapy: Secondary | ICD-10-CM | POA: Diagnosis not present

## 2020-10-30 DIAGNOSIS — R635 Abnormal weight gain: Secondary | ICD-10-CM | POA: Diagnosis not present

## 2020-11-01 ENCOUNTER — Other Ambulatory Visit: Payer: Self-pay | Admitting: Cardiology

## 2020-11-01 ENCOUNTER — Other Ambulatory Visit: Payer: Self-pay

## 2020-11-01 ENCOUNTER — Other Ambulatory Visit: Payer: Self-pay | Admitting: Urology

## 2020-11-01 ENCOUNTER — Ambulatory Visit (HOSPITAL_COMMUNITY)
Admission: RE | Admit: 2020-11-01 | Discharge: 2020-11-01 | Disposition: A | Discharge status: Home / Self Care | Payer: Medicare HMO | Source: Ambulatory Visit | Attending: Urology | Admitting: Urology

## 2020-11-01 ENCOUNTER — Encounter (HOSPITAL_COMMUNITY): Payer: Self-pay

## 2020-11-01 ENCOUNTER — Ambulatory Visit (INDEPENDENT_AMBULATORY_CARE_PROVIDER_SITE_OTHER): Payer: Medicare HMO | Admitting: Urology

## 2020-11-01 DIAGNOSIS — R972 Elevated prostate specific antigen [PSA]: Secondary | ICD-10-CM

## 2020-11-01 DIAGNOSIS — C61 Malignant neoplasm of prostate: Secondary | ICD-10-CM | POA: Insufficient documentation

## 2020-11-01 MED ORDER — GENTAMICIN SULFATE 40 MG/ML IJ SOLN: 80.0000 mg | Freq: Once | INTRAMUSCULAR | Status: AC

## 2020-11-01 MED ORDER — GENTAMICIN SULFATE 40 MG/ML IJ SOLN
INTRAMUSCULAR | Status: AC
  Administered 2020-11-01: 80 mg via INTRAMUSCULAR
  Filled 2020-11-01: qty 2

## 2020-11-01 MED ORDER — LIDOCAINE HCL (PF) 2 % IJ SOLN
INTRAMUSCULAR | Status: AC
  Administered 2020-11-01: 10 mL
  Filled 2020-11-01: qty 10

## 2020-11-01 MED ORDER — LIDOCAINE HCL (PF) 2 % IJ SOLN: 10.0000 mL | Freq: Once | INTRAMUSCULAR | Status: AC

## 2020-11-01 NOTE — Sedation Documentation (Signed)
PT tolerated prostate biopsy procedure well today. Labs obtained and sent for pathology. PT left via wheelchair with his homehealth aide at discharge with no acute distress noted and verbalized understanding of discharge instructions. PT to follow up with urologist as scheduled on 11/08/20.

## 2020-11-01 NOTE — Discharge Instructions (Signed)

## 2020-11-03 DIAGNOSIS — F330 Major depressive disorder, recurrent, mild: Secondary | ICD-10-CM | POA: Diagnosis not present

## 2020-11-03 DIAGNOSIS — J449 Chronic obstructive pulmonary disease, unspecified: Secondary | ICD-10-CM | POA: Diagnosis not present

## 2020-11-03 DIAGNOSIS — E1122 Type 2 diabetes mellitus with diabetic chronic kidney disease: Secondary | ICD-10-CM | POA: Diagnosis not present

## 2020-11-03 DIAGNOSIS — I25118 Atherosclerotic heart disease of native coronary artery with other forms of angina pectoris: Secondary | ICD-10-CM | POA: Diagnosis not present

## 2020-11-03 DIAGNOSIS — F17200 Nicotine dependence, unspecified, uncomplicated: Secondary | ICD-10-CM | POA: Diagnosis not present

## 2020-11-03 DIAGNOSIS — I480 Paroxysmal atrial fibrillation: Secondary | ICD-10-CM | POA: Diagnosis not present

## 2020-11-03 DIAGNOSIS — D6869 Other thrombophilia: Secondary | ICD-10-CM | POA: Diagnosis not present

## 2020-11-03 DIAGNOSIS — I509 Heart failure, unspecified: Secondary | ICD-10-CM | POA: Diagnosis not present

## 2020-11-03 DIAGNOSIS — E1151 Type 2 diabetes mellitus with diabetic peripheral angiopathy without gangrene: Secondary | ICD-10-CM | POA: Diagnosis not present

## 2020-11-03 DIAGNOSIS — I69354 Hemiplegia and hemiparesis following cerebral infarction affecting left non-dominant side: Secondary | ICD-10-CM | POA: Diagnosis not present

## 2020-11-03 DIAGNOSIS — E669 Obesity, unspecified: Secondary | ICD-10-CM | POA: Diagnosis not present

## 2020-11-03 DIAGNOSIS — F419 Anxiety disorder, unspecified: Secondary | ICD-10-CM | POA: Diagnosis not present

## 2020-11-08 ENCOUNTER — Telehealth (INDEPENDENT_AMBULATORY_CARE_PROVIDER_SITE_OTHER): Payer: Medicare HMO | Admitting: Urology

## 2020-11-08 ENCOUNTER — Other Ambulatory Visit: Payer: Self-pay

## 2020-11-08 ENCOUNTER — Encounter: Payer: Self-pay | Admitting: Urology

## 2020-11-08 DIAGNOSIS — C61 Malignant neoplasm of prostate: Secondary | ICD-10-CM

## 2020-11-08 NOTE — Progress Notes (Signed)
11/08/2020 4:16 PM   [name] Oct 27, 1965 [id number]  Referring provider: Caryl Bis, MD [address] [place],  [place] [address] Patient location: home Physician location: office I connected with  [name] on 11/21/20 by a video enabled telemedicine application and verified that I am speaking with the correct person using two identifiers.   I discussed the limitations of evaluation and management by telemedicine. The patient expressed understanding and agreed to proceed.    followup prostate biopsy  HPI: [name] is a 55yo here for followup after prostate biopsy. 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 PSA 19.8. He has moderate LUTS on flomax 0.4mg  daily   PMH: Past Medical History:  Diagnosis Date  . Anaphylaxis    IGE mediated  . Anxiety   . Arthritis   . Cardiomyopathy ([place])   . CHF (congestive heart failure) ([place])   . COPD (chronic obstructive pulmonary disease) ([place])   . Coronary atherosclerosis of native coronary artery    Multivessel status post CABG 2010  . Depression   . Essential hypertension   . History of CVA (cerebrovascular accident) 01/2012   Right posterior frontal cortical and subcortical brain by MRI, no hemorrhage. Carotid Dopplers showed only 1-50% bilateral ICA stenoses. Echocardiogram showed LVEF 50%, no major valvular abnormalities.  . Mixed hyperlipidemia   . Myocardial infarction ([place]) 2010  . OSA (obstructive sleep apnea)   . Stroke ([place])   . Type 2 diabetes mellitus [facility])     Surgical History: Past Surgical History:  Procedure Laterality Date  . CHOLECYSTECTOMY    . CORONARY ARTERY BYPASS GRAFT  2010   LIMA to LAD, SVG to diagonal, SVG to OM1 and OM 2, SVG to RCA  . DENTAL SURGERY  2003  . GASTRIC BYPASS  2010  . HERNIA REPAIR  2011, 2012  . INCISIONAL HERNIA REPAIR N/A 07/23/2013   Procedure: HERNIA REPAIR INCISIONAL WITH MESH;  Surgeon: Jamesetta So, MD;  Location: [facility];   Service: General;  Laterality: N/A;  . INSERTION OF MESH N/A 07/23/2013   Procedure: INSERTION OF MESH;  Surgeon: Jamesetta So, MD;  Location: [facility];  Service: General;  Laterality: N/A;  . LEFT HEART CATHETERIZATION WITH CORONARY/GRAFT ANGIOGRAM N/A 11/01/2013   Procedure: LEFT HEART CATHETERIZATION WITH Beatrix Fetters;  Surgeon: Blane Ohara, MD;  Location: [facility] CATH LAB;  Service: Cardiovascular;  Laterality: N/A;  . TOE AMPUTATION  1998   right 1st and 2nd toe  . TONSILECTOMY, ADENOIDECTOMY, BILATERAL MYRINGOTOMY AND TUBES    . TONSILLECTOMY    . VENTRAL HERNIA REPAIR N/A 10/28/2012   Procedure: LAPAROSCOPIC VENTRAL HERNIA;  Surgeon: Donato Heinz, MD;  Location: [facility];  Service: General;  Laterality: N/A;    Home Medications:  Allergies as of 11/08/2020      Reactions   Contrast Media [iodinated Diagnostic Agents] Anaphylaxis, Shortness Of Breath, Swelling, Rash   Isovue contrast is most acceptable agent based on previous experience and testing with premedications   Ibuprofen Anaphylaxis, Hives, Swelling   Nsaids Anaphylaxis, Hives, Swelling, Rash   Can take Aspirin 325 mg or lower   Other Other (See Comments)   Anti-physcotics Personality change      Medication List       Accurate as of November 08, 2020  4:16 PM. If you have any questions, ask your nurse or doctor.        albuterol 108 (90 Base) MCG/ACT inhaler  Commonly known as: VENTOLIN HFA Inhale 2 puffs into the lungs every 6 (six) hours as needed for wheezing or shortness of breath.   ALPRAZolam 1 MG tablet Commonly known as: XANAX 0.5-1 mg daily as needed for anxiety   aspirin EC 81 MG tablet Take 1 tablet (81 mg total) by mouth daily.   atorvastatin 40 MG tablet Commonly known as: LIPITOR Take 1 tablet by mouth daily.   baclofen 20 MG tablet Commonly known as: LIORESAL Take 1 tablet (20 mg total) by mouth 2 (two) times daily.   carvedilol 6.25 MG tablet Commonly known as: COREG Take 6.25  mg by mouth 2 (two) times daily with a meal.   clopidogrel 75 MG tablet Commonly known as: PLAVIX Take 1 tablet by mouth daily.   dapsone 25 MG tablet   diltiazem 240 MG 24 hr capsule Commonly known as: CARDIZEM CD Take 240 mg by mouth daily.   DULoxetine 60 MG capsule Commonly known as: CYMBALTA Take 90 mg daily (60 mg + 30 mg)   DULoxetine 30 MG capsule Commonly known as: CYMBALTA Take 90 mg daily (60 mg + 30 mg)   Entresto 49-51 MG Generic drug: sacubitril-valsartan TAKE (1) TABLET TWICE DAILY.   fenofibrate micronized 134 MG capsule Commonly known as: LOFIBRA Take 134 mg by mouth daily before breakfast.   fluticasone 0.05 % cream Commonly known as: CUTIVATE Apply 1 application topically 2 (two) times daily.   furosemide 20 MG tablet Commonly known as: LASIX Take 20 mg by mouth daily.   gabapentin 600 MG tablet Commonly known as: NEURONTIN Take 1 tablet by mouth 2 (two) times daily.   isosorbide mononitrate 60 MG 24 hr tablet Commonly known as: IMDUR Take 60 mg by mouth 2 (two) times daily.   levofloxacin 750 MG tablet Commonly known as: Levaquin Take 1 tablet (750 mg total) by mouth daily.   nitroGLYCERIN 0.4 MG SL tablet Commonly known as: NITROSTAT Place 0.4 mg under the tongue every 5 (five) minutes as needed. For chest pain   oxyCODONE 5 MG immediate release tablet Commonly known as: Oxy IR/ROXICODONE Take 5 mg by mouth every 6 (six) hours as needed for severe pain.   oxyCODONE-acetaminophen 7.5-325 MG tablet Commonly known as: PERCOCET   Semaglutide (1 MG/DOSE) 2 MG/1.5ML Sopn Inject into the skin once a week.   silver sulfADIAZINE 1 % cream Commonly known as: SILVADENE   tamsulosin 0.4 MG Caps capsule Commonly known as: FLOMAX Take 1 capsule (0.4 mg total) by mouth daily after supper.   tiZANidine 2 MG tablet Commonly known as: ZANAFLEX Take 2 mg by mouth once.   traZODone 50 MG tablet Commonly known as: DESYREL 25-50 mg at night as  needed for sleep   triamcinolone cream 0.1 % Commonly known as: KENALOG Apply 1 application topically 2 (two) times daily.   Vitamin D 50 MCG (2000 UT) tablet Take 2,000 Units by mouth daily at 12 noon.       Allergies:  Allergies  Allergen Reactions  . Contrast Media [Iodinated Diagnostic Agents] Anaphylaxis, Shortness Of Breath, Swelling and Rash    Isovue contrast is most acceptable agent based on previous experience and testing with premedications  . Ibuprofen Anaphylaxis, Hives and Swelling  . Nsaids Anaphylaxis, Hives, Swelling and Rash    Can take Aspirin 325 mg or lower  . Other Other (See Comments)    Anti-physcotics Personality change    Family History: Family History  Problem Relation Age of Onset  . Lung  cancer Father   . Heart disease Father   . Heart disease Mother   . Congestive Heart Failure Mother   . Diabetes Mother   . Hyperlipidemia Mother   . Alcohol abuse Brother   . Breast cancer Maternal Aunt   . Suicidality Cousin     Social History:  reports that he has been smoking cigarettes. He started smoking about 38 years ago. He has a 45.00 pack-year smoking history. He has never used smokeless tobacco. He reports that he does not drink alcohol and does not use drugs.  ROS: All other review of systems were reviewed and are negative except what is noted above in HPI  Physical Exam: There were no vitals taken for this visit.  Constitutional:  Alert and oriented, No acute distress. HEENT: [place] AT, moist mucus membranes.  Trachea midline, no masses. Cardiovascular: No clubbing, cyanosis, or edema. Respiratory: Normal respiratory effort, no increased work of breathing. GI: Abdomen is soft, nontender, nondistended, no abdominal masses GU: No CVA tenderness.  Lymph: No cervical or inguinal lymphadenopathy. Skin: No rashes, bruises or suspicious lesions. Neurologic: Grossly intact, no focal deficits, moving all 4 extremities. Psychiatric: Normal mood and  affect.  Laboratory Data: Lab Results  Component Value Date   WBC 13.4 (H) 07/28/2017   HGB 14.9 07/28/2017   HCT 44.6 07/28/2017   MCV 86.8 07/28/2017   PLT 254 07/28/2017    Lab Results  Component Value Date   CREATININE 1.09 07/28/2017    No results found for: PSA  No results found for: TESTOSTERONE  Lab Results  Component Value Date   HGBA1C 6.0 (H) 10/31/2013    Urinalysis    Component Value Date/Time   COLORURINE YELLOW 06/21/2013 2323   APPEARANCEUR Clear 09/29/2020 1324   LABSPEC 1.020 06/21/2013 2323   PHURINE 7.0 06/21/2013 2323   GLUCOSEU Negative 09/29/2020 1324   HGBUR TRACE (A) 06/21/2013 2323   BILIRUBINUR Negative 09/29/2020 1324   KETONESUR NEGATIVE 06/21/2013 2323   PROTEINUR Trace (A) 09/29/2020 1324   PROTEINUR NEGATIVE 06/21/2013 2323   UROBILINOGEN 0.2 06/21/2013 2323   NITRITE Negative 09/29/2020 1324   NITRITE NEGATIVE 06/21/2013 2323   LEUKOCYTESUR Negative 09/29/2020 1324    Lab Results  Component Value Date   LABMICR See below: 09/29/2020   WBCUA None seen 09/29/2020   LABEPIT None seen 09/29/2020   MUCUS Present 09/29/2020   BACTERIA Few 09/29/2020    Pertinent Imaging:  Results for orders placed during the hospital encounter of 02/19/07  DG Abd 1 View  Narrative History: Left renal calculus, status post lithotripsy  ABDOMEN ONE VIEW:  Tiny residual stone fragment inferior pole left kidney, 1.5 mm diameter. No distal ureteral calculi evident. Tiny phleboliths in pelvis bilaterally stable. Bones unremarkable. Bowel gas pattern normal.  IMPRESSION: Single tiny residual stone fragment inferior pole left kidney.  Provider: Vaughan Basta, Rodney Sink  No results found for this or any previous visit.  No results found for this or any previous visit.  No results found for this or any previous visit.  No results found for this or any previous visit.  No results found for this or any previous visit.  No results  found for this or any previous visit.  No results found for this or any previous visit.   Assessment & Plan:    1. Prostate cancer [facility]) I discussed the natural history of unfavorable intermediate risk prostate cancer with the patient and the various treatment options including active surveillance, RALP, IMRT,  brachytherapy, cryotherapy, HIFU and ADT. I will obtain a CT and bone scan prior to his appointment with Dr. Tammi Klippel    No follow-ups on file.  Nicolette Bang, MD  [facility] [place]

## 2020-11-08 NOTE — Patient Instructions (Signed)
Prostate Cancer  The prostate is a male gland that helps make semen. It is located below a man's bladder, in front of the rectum. Prostate cancer is when abnormal cells grow in this gland. What are the causes? The cause of this condition is not known. What increases the risk? You are more likely to develop this condition if:  You are 53 years of age or older.  You are African American.  You have a family history of prostate cancer.  You have a family history of breast cancer. What are the signs or symptoms? Symptoms of this condition include:  A need to pee often.  Peeing that is weak, or pee that stops and starts.  Trouble starting or stopping your pee.  Inability to pee.  Blood in your pee or semen.  Pain in the lower back, lower belly (abdomen), hips, or upper thighs.  Trouble getting an erection.  Trouble emptying all of your pee. How is this treated? Treatment for this condition depends on your age, your health, the kind of treatment you like, and how far the cancer has spread. Treatments include:  Being watched. This is called observation. You will be tested from time to time, but you will not get treated. Tests are to make sure that the cancer is not growing.  Surgery. This may be done to remove the prostate, to remove the testicles, or to freeze or kill cancer cells.  Radiation. This uses a strong beam to kill cancer cells.  Ultrasound energy. This uses strong sound waves to kill cancer cells.  Chemotherapy. This uses medicines that stop cancer cells from increasing. This kills cancer cells and healthy cells.  Targeted therapy. This kills cancer cells only. Healthy cells are not affected.  Hormone treatment. This stops the body from making hormones that help the cancer cells to grow. Follow these instructions at home:  Take over-the-counter and prescription medicines only as told by your doctor.  Eat a healthy diet.  Get plenty of sleep.  Ask your  doctor for help to find a support group for men with prostate cancer.  If you have to go to the hospital, let your cancer doctor (oncologist) know.  Treatment may affect your ability to have sex. Touch, hold, hug, and caress your partner to have intimate moments.  Keep all follow-up visits as told by your doctor. This is important. Contact a doctor if:  You have new or more trouble peeing.  You have new or more blood in your pee.  You have new or more pain in your hips, back, or chest. Get help right away if:  You have weakness in your legs.  You lose feeling in your legs.  You cannot control your pee or your poop (stool).  You have chills or a fever. Summary  The prostate is a male gland that helps make semen.  Prostate cancer is when abnormal cells grow in this gland.  Treatment includes doing surgery, using medicines, using very strong beams, or watching without treatment.  Ask your doctor for help to find a support group for men with prostate cancer.  Contact a doctor if you have problems peeing or have any new pain that you did not have before. This information is not intended to replace advice given to you by your health care provider. Make sure you discuss any questions you have with your health care provider. Document Revised: 06/15/2019 Document Reviewed: 06/15/2019 Elsevier Patient Education  2021 Elsevier Inc.  

## 2020-11-08 NOTE — Patient Instructions (Signed)

## 2020-11-08 NOTE — Progress Notes (Signed)
Prostate Biopsy Procedure   Informed consent was obtained after discussing risks/benefits of the procedure.  A time out was performed to ensure correct patient identity.  Pre-Procedure: - Last PSA Level: No results found for: "PSA" - Gentamicin given prophylactically - Levaquin 500 mg administered PO -Transrectal Ultrasound performed revealing a 36.4 gm prostate -No significant hypoechoic or median lobe noted  Procedure: - Prostate block performed using 10 cc 1% lidocaine and biopsies taken from sextant areas, a total of 12 under ultrasound guidance.  Post-Procedure: - Patient tolerated the procedure well - He was counseled to seek immediate medical attention if experiences any severe pain, significant bleeding, or fevers - Return in one week to discuss biopsy results  

## 2020-11-13 ENCOUNTER — Ambulatory Visit: Payer: Medicare HMO | Admitting: Cardiology

## 2020-11-21 ENCOUNTER — Encounter: Payer: Medicare HMO | Attending: Physical Medicine & Rehabilitation | Admitting: Physical Medicine & Rehabilitation

## 2020-11-21 ENCOUNTER — Encounter: Payer: Self-pay | Admitting: Physical Medicine & Rehabilitation

## 2020-11-21 ENCOUNTER — Other Ambulatory Visit: Payer: Self-pay

## 2020-11-21 VITALS — BP 105/64 | HR 76 | Temp 98.6°F | Ht 71.0 in | Wt 235.0 lb

## 2020-11-21 DIAGNOSIS — G8110 Spastic hemiplegia affecting unspecified side: Secondary | ICD-10-CM | POA: Diagnosis not present

## 2020-11-21 NOTE — Progress Notes (Signed)
Subjective:    Patient ID: [name], male    DOB: Sep 28, 1965, 55 y.o.   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  Patient had a total of 1500 units injected.  As opposed to the injection in December 2021, the left quad and hamstring were treated rather than the lower leg gastrosoleus and posterior tibialis.  Patient indicates that he has had more problems advancing the left foot without his AFO on.  He preferred the other treatment sites  Interval medical history significant for diagnosis of prostate CA currently undergoing work-up awaits bone scan    Pain Inventory Average Pain 6 Pain Right Now 6 My pain is intermittent and throbbing  In the last 24 hours, has pain interfered with the following? General activity n/a Relation with others n/a Enjoyment of life n/a What TIME of day is your pain at its worst? varies Sleep (in general) Poor  Pain is worse with: some activites Pain improves with: rest and medication Relief from Meds: 8  Family History  Problem Relation Age of Onset  . Lung cancer Father   . Heart disease Father   . Heart disease Mother   . Congestive Heart Failure Mother   . Diabetes Mother   . Hyperlipidemia Mother   . Alcohol abuse Brother   . Breast cancer Maternal Aunt   . Suicidality Cousin    Social History   Socioeconomic History  . Marital status: Legally Separated    Spouse name: Jolayne Haines  . Number of children: 2  . Years of education: Not on file  . Highest education level: 12th grade  Occupational History  . Occupation: disabled  Tobacco Use  . Smoking status: Current Every Day Smoker    Packs/day: 1.50    Years: 30.00    Pack years: 45.00    Types: Cigarettes    Start date: 06/08/1982  . Smokeless tobacco: Never Used  . Tobacco comment: 1 pack per day 03/04/2018  Vaping Use  . Vaping Use: Never used  Substance  and Sexual Activity  . Alcohol use: No  . Drug use: No  . Sexual activity: Yes    Birth control/protection: None  Other Topics Concern  . Not on file  Social History Narrative   Disabled since age 89 lives with wife and children      Patient is right-handed. He is married, lives in a 1 story house, has a ramp to enter. Drinks 64oz of Mtn. Dew a day. Prior to CVA was walking daily. 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   Social Determinants of Health   Financial Resource Strain: Not on file  Food Insecurity: Not on file  Transportation Needs: Not on file  Physical Activity: Not on file  Stress: Not on file  Social Connections: Not on file   Past Surgical History:  Procedure Laterality Date  . CHOLECYSTECTOMY    . CORONARY ARTERY BYPASS GRAFT  2010   LIMA to LAD, SVG to diagonal, SVG to OM1 and OM 2, SVG to RCA  . DENTAL SURGERY  2003  . GASTRIC BYPASS  2010  . HERNIA REPAIR  2011, 2012  . INCISIONAL HERNIA REPAIR N/A 07/23/2013   Procedure: HERNIA REPAIR INCISIONAL WITH MESH;  Surgeon: Jamesetta So, MD;  Location: [facility];  Service: General;  Laterality: N/A;  . INSERTION OF MESH N/A 07/23/2013  Procedure: INSERTION OF MESH;  Surgeon: Jamesetta So, MD;  Location: [facility];  Service: General;  Laterality: N/A;  . LEFT HEART CATHETERIZATION WITH CORONARY/GRAFT ANGIOGRAM N/A 11/01/2013   Procedure: LEFT HEART CATHETERIZATION WITH Beatrix Fetters;  Surgeon: Blane Ohara, MD;  Location: [facility] CATH LAB;  Service: Cardiovascular;  Laterality: N/A;  . TOE AMPUTATION  1998   right 1st and 2nd toe  . TONSILECTOMY, ADENOIDECTOMY, BILATERAL MYRINGOTOMY AND TUBES    . TONSILLECTOMY    . VENTRAL HERNIA REPAIR N/A 10/28/2012   Procedure: LAPAROSCOPIC VENTRAL HERNIA;  Surgeon: Donato Heinz, MD;  Location: [facility];  Service: General;  Laterality: N/A;   Past Surgical History:  Procedure Laterality Date  . CHOLECYSTECTOMY    . CORONARY ARTERY BYPASS GRAFT  2010    LIMA to LAD, SVG to diagonal, SVG to OM1 and OM 2, SVG to RCA  . DENTAL SURGERY  2003  . GASTRIC BYPASS  2010  . HERNIA REPAIR  2011, 2012  . INCISIONAL HERNIA REPAIR N/A 07/23/2013   Procedure: HERNIA REPAIR INCISIONAL WITH MESH;  Surgeon: Jamesetta So, MD;  Location: [facility];  Service: General;  Laterality: N/A;  . INSERTION OF MESH N/A 07/23/2013   Procedure: INSERTION OF MESH;  Surgeon: Jamesetta So, MD;  Location: [facility];  Service: General;  Laterality: N/A;  . LEFT HEART CATHETERIZATION WITH CORONARY/GRAFT ANGIOGRAM N/A 11/01/2013   Procedure: LEFT HEART CATHETERIZATION WITH Beatrix Fetters;  Surgeon: Blane Ohara, MD;  Location[facility] LAB;  Service: Cardiovascular;  Laterality: N/A;  . TOE AMPUTATION  1998   right 1st and 2nd toe  . TONSILECTOMY, ADENOIDECTOMY, BILATERAL MYRINGOTOMY AND TUBES    . TONSILLECTOMY    . VENTRAL HERNIA REPAIR N/A 10/28/2012   Procedure: LAPAROSCOPIC VENTRAL HERNIA;  Surgeon: Donato Heinz, MD;  Location: [facility];  Service: General;  Laterality: N/A;   Past Medical History:  Diagnosis Date  . Anaphylaxis    IGE mediated  . Anxiety   . Arthritis   . Cardiomyopathy ([place])   . CHF (congestive heart failure) ([place])   . COPD (chronic obstructive pulmonary disease) ([place])   . Coronary atherosclerosis of native coronary artery    Multivessel status post CABG 2010  . Depression   . Essential hypertension   . History of CVA (cerebrovascular accident) 01/2012   Right posterior frontal cortical and subcortical brain by MRI, no hemorrhage. Carotid Dopplers showed only 1-50% bilateral ICA stenoses. Echocardiogram showed LVEF 50%, no major valvular abnormalities.  . Mixed hyperlipidemia   . Myocardial infarction ([place]) 2010  . OSA (obstructive sleep apnea)   . Stroke ([place])   . Type 2 diabetes mellitus ([facility])    BP 105/64   Pulse 76   Temp 98.6 F (37 C)   Ht 5\' 11"  (1.803 m)   Wt 235 lb (106.6 kg)   SpO2 93%   BMI 32.78 kg/m   Opioid Risk  Score:   Fall Risk Score:  `1  Depression screen PHQ 2/9  Depression screen [facility] 2/9 10/10/2020 04/07/2020 11/06/2018 08/14/2018 05/19/2018 09/29/2017  Decreased Interest 1 0 0 1 1 1   Down, Depressed, Hopeless 1 0 0 1 1 3   PHQ - 2 Score 2 0 0 2 2 4   Altered sleeping - - - - - 3  Tired, decreased energy - - - - - 2  Change in appetite - - - - - 3  Feeling bad or failure about yourself  - - - - -  3  Trouble concentrating - - - - - 3  Moving slowly or fidgety/restless - - - - - 2  Suicidal thoughts - - - - - 0  PHQ-9 Score - - - - - 20  Difficult doing work/chores - - - - - Somewhat difficult  Some encounter information is confidential and restricted. Go to Review Flowsheets activity to see all data.  Some recent data might be hidden   Review of Systems  Constitutional: Negative.   HENT: Negative.   Eyes: Negative.   Respiratory: Negative.   Cardiovascular: Negative.   Gastrointestinal: Negative.        Prostrate cancer  Endocrine:       Diabetic  Genitourinary: Positive for difficulty urinating.  Musculoskeletal: Positive for gait problem.  Skin: Negative.   Allergic/Immunologic: Negative.   Hematological: Negative.   Psychiatric/Behavioral: Negative.   All other systems reviewed and are negative.      Objective:   Physical Exam Vitals and nursing note reviewed.  Constitutional:      Appearance: He is obese.  HENT:     Head: Normocephalic and atraumatic.  Eyes:     Extraocular Movements: Extraocular movements intact.     Conjunctiva/sclera: Conjunctivae normal.     Pupils: Pupils are equal, round, and reactive to light.  Musculoskeletal:     Left lower leg: Edema present.  Skin:    General: Skin is warm.     Findings: Erythema present.     Comments: Stasis dermatitis left lower extremity  Neurological:     Mental Status: He is alert and oriented to person, place, and time.     Comments: Tone MAS 1 in the left bicep MAS 1/2 in the left finger flexors MAS 1 in the  hamstrings Elevated tone in the gastrosoleus  Psychiatric:        Mood and Affect: Mood normal.        Behavior: Behavior normal.    MAS 2 in the left quad there is hyperactive reflex at the left knee     Assessment & Plan:  1.  Chronic left spastic hemiplegia.  He ambulates with assistance and usually wears a left AFO however at night when he gets up to go to the bathroom he does not don his AFO and his foot drags.  Therefore we will go back to the injection sites performed in December and not reinject the left hamstring or the left quad. Number of units per muscle LUE FDS 100 FDP 200  Biceps 200   Trunk Left pec  100U   LLE  Med gastroc 400U  Tibialis post  300U  Lat gastroc  200U

## 2020-11-21 NOTE — Patient Instructions (Signed)
Will go back to same treatment as in Dec 2021 when we repeat Dysport injection in June

## 2020-11-23 DIAGNOSIS — Z6831 Body mass index (BMI) 31.0-31.9, adult: Secondary | ICD-10-CM | POA: Diagnosis not present

## 2020-11-23 DIAGNOSIS — Z79899 Other long term (current) drug therapy: Secondary | ICD-10-CM | POA: Diagnosis not present

## 2020-11-23 DIAGNOSIS — F17200 Nicotine dependence, unspecified, uncomplicated: Secondary | ICD-10-CM | POA: Diagnosis not present

## 2020-11-23 DIAGNOSIS — E1165 Type 2 diabetes mellitus with hyperglycemia: Secondary | ICD-10-CM | POA: Diagnosis not present

## 2020-11-23 DIAGNOSIS — F17210 Nicotine dependence, cigarettes, uncomplicated: Secondary | ICD-10-CM | POA: Diagnosis not present

## 2020-11-23 DIAGNOSIS — G894 Chronic pain syndrome: Secondary | ICD-10-CM | POA: Diagnosis not present

## 2020-11-23 DIAGNOSIS — M545 Low back pain, unspecified: Secondary | ICD-10-CM | POA: Diagnosis not present

## 2020-11-27 ENCOUNTER — Telehealth: Payer: Self-pay

## 2020-11-27 ENCOUNTER — Telehealth: Payer: Self-pay | Admitting: Radiation Oncology

## 2020-11-27 NOTE — Telephone Encounter (Signed)
Dr Johny Shears office [facility] Rubin [place] [phone]) called regarding patient recently decided to go with surgery instead of keeping appointment with Dr. Tammi Klippel on 12-12-20 at 2:30 pm.   Please contact patient to set up surgery. Call patient back at [phone].  Thanks, Helene Kelp

## 2020-11-27 NOTE — Telephone Encounter (Signed)
Patient, with daughter [place], on the phone stating that they have elected for surgery. They would like to cancel appt w/Dr. Tammi Klippel for 5/31.   I was connected to [place] in [place] and left a msg for Dr. Alyson Ingles about this.

## 2020-11-28 NOTE — Telephone Encounter (Signed)
I spoke with patient. Patient has upcoming imaging scheduled ordered by Dr. Alyson Ingles. Patient instructed to keep imaging appt and keep scheduled f/u with MD on 06/13 and can discuss surgery options at next appt. Patient voiced understanding.

## 2020-11-29 DIAGNOSIS — E1165 Type 2 diabetes mellitus with hyperglycemia: Secondary | ICD-10-CM | POA: Diagnosis not present

## 2020-11-29 DIAGNOSIS — E7800 Pure hypercholesterolemia, unspecified: Secondary | ICD-10-CM | POA: Diagnosis not present

## 2020-11-29 DIAGNOSIS — R635 Abnormal weight gain: Secondary | ICD-10-CM | POA: Diagnosis not present

## 2020-11-29 DIAGNOSIS — E559 Vitamin D deficiency, unspecified: Secondary | ICD-10-CM | POA: Diagnosis not present

## 2020-11-29 DIAGNOSIS — Z6831 Body mass index (BMI) 31.0-31.9, adult: Secondary | ICD-10-CM | POA: Diagnosis not present
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
## 2020-12-04 ENCOUNTER — Other Ambulatory Visit: Payer: Self-pay

## 2020-12-04 ENCOUNTER — Telehealth (INDEPENDENT_AMBULATORY_CARE_PROVIDER_SITE_OTHER): Payer: Medicare HMO | Admitting: Psychiatry

## 2020-12-04 ENCOUNTER — Encounter: Payer: Self-pay | Admitting: Psychiatry

## 2020-12-04 DIAGNOSIS — F331 Major depressive disorder, recurrent, moderate: Secondary | ICD-10-CM | POA: Diagnosis not present

## 2020-12-04 MED ORDER — DULOXETINE HCL 30 MG PO CPEP: ORAL_CAPSULE | ORAL | 0 refills | Status: DC

## 2020-12-04 MED ORDER — DULOXETINE HCL 60 MG PO CPEP: ORAL_CAPSULE | ORAL | 0 refills | Status: DC

## 2020-12-04 NOTE — Patient Instructions (Signed)
1.Continueduloxetine 90 mg(60 mg + 30 mg)daily  2.Next appointment: 8/19 at 8:40

## 2020-12-06 ENCOUNTER — Other Ambulatory Visit: Payer: Self-pay | Admitting: Psychiatry

## 2020-12-06 ENCOUNTER — Telehealth: Payer: Self-pay

## 2020-12-06 NOTE — Telephone Encounter (Signed)
pt called states he needs a refill on the xanax.  He states he going on vacation and he needs to take it with him.

## 2020-12-06 NOTE — Telephone Encounter (Signed)
Advise him to contact his other provider who has been prescribing Xanax to him.

## 2020-12-08 ENCOUNTER — Encounter (HOSPITAL_COMMUNITY)
Admission: RE | Admit: 2020-12-08 | Discharge: 2020-12-08 | Disposition: A | Discharge status: Home / Self Care | Payer: Medicare HMO | Source: Ambulatory Visit | Attending: Urology | Admitting: Urology

## 2020-12-08 ENCOUNTER — Ambulatory Visit (HOSPITAL_COMMUNITY)
Admission: RE | Admit: 2020-12-08 | Discharge: 2020-12-08 | Disposition: A | Discharge status: Home / Self Care | Payer: Medicare HMO | Source: Ambulatory Visit | Attending: Urology | Admitting: Urology

## 2020-12-08 ENCOUNTER — Encounter (HOSPITAL_COMMUNITY): Payer: Self-pay

## 2020-12-08 ENCOUNTER — Other Ambulatory Visit: Payer: Self-pay

## 2020-12-08 DIAGNOSIS — C61 Malignant neoplasm of prostate: Secondary | ICD-10-CM | POA: Diagnosis not present

## 2020-12-08 DIAGNOSIS — M549 Dorsalgia, unspecified: Secondary | ICD-10-CM | POA: Diagnosis not present

## 2020-12-08 DIAGNOSIS — Z9049 Acquired absence of other specified parts of digestive tract: Secondary | ICD-10-CM | POA: Diagnosis not present

## 2020-12-08 DIAGNOSIS — M25512 Pain in left shoulder: Secondary | ICD-10-CM | POA: Diagnosis not present

## 2020-12-08 DIAGNOSIS — I700 Atherosclerosis of aorta: Secondary | ICD-10-CM | POA: Diagnosis not present

## 2020-12-08 DIAGNOSIS — M25552 Pain in left hip: Secondary | ICD-10-CM | POA: Diagnosis not present

## 2020-12-08 HISTORY — DX: Malignant (primary) neoplasm, unspecified: C80.1

## 2020-12-08 MED ORDER — TECHNETIUM TC 99M MEDRONATE IV KIT
20.0000 | PACK | Freq: Once | INTRAVENOUS | Status: AC | PRN
  Administered 2020-12-08: 20.7 via INTRAVENOUS

## 2020-12-12 ENCOUNTER — Ambulatory Visit: Admission: RE | Admit: 2020-12-12 | Payer: Medicare HMO | Source: Ambulatory Visit | Admitting: Radiation Oncology

## 2020-12-12 ENCOUNTER — Ambulatory Visit: Payer: Medicare HMO

## 2020-12-12 ENCOUNTER — Ambulatory Visit: Payer: Medicare HMO | Admitting: Radiation Oncology

## 2020-12-12 NOTE — Telephone Encounter (Signed)
-----   Message from Cleon Gustin, MD sent at 12/12/2020 10:04 AM EDT ----- CT shows no evidence of metastatic disease ----- Message ----- From: Dorisann Frames, RN Sent: 12/12/2020   8:32 AM EDT To: Cleon Gustin, MD  Please review

## 2020-12-12 NOTE — Telephone Encounter (Signed)
Patient called and notified of results.  °

## 2020-12-13 DIAGNOSIS — C61 Malignant neoplasm of prostate: Secondary | ICD-10-CM

## 2020-12-13 HISTORY — DX: Malignant neoplasm of prostate: C61

## 2020-12-20 ENCOUNTER — Other Ambulatory Visit: Payer: Self-pay | Admitting: Cardiology

## 2020-12-25 ENCOUNTER — Other Ambulatory Visit: Payer: Self-pay

## 2020-12-25 ENCOUNTER — Ambulatory Visit (INDEPENDENT_AMBULATORY_CARE_PROVIDER_SITE_OTHER): Payer: Medicare HMO | Admitting: Urology

## 2020-12-25 ENCOUNTER — Encounter: Payer: Self-pay | Admitting: Urology

## 2020-12-25 VITALS — BP 103/63 | HR 84

## 2020-12-25 DIAGNOSIS — G894 Chronic pain syndrome: Secondary | ICD-10-CM | POA: Diagnosis not present

## 2020-12-25 DIAGNOSIS — C61 Malignant neoplasm of prostate: Secondary | ICD-10-CM

## 2020-12-25 NOTE — Patient Instructions (Signed)
????? ?????????? Prostate Cancer  ?????????? ??? ????? (???? 1.5 ???? [3.8 ??] ??????? 1 ???? [2.5 ??]) ???? ?? ????? ?????. ????? ??? ????? ????? ????? ????????. ??? ????? ?????????? ??????? ????? ??????? ?? ??? ??????????. ?? ????? ??? ??????? ????? ?????? ???? ?????? ??? ?????. ?? ????? ????? ?????? ???? ????? ???????? ??????? ???? ?????? ?? ?????? ???????: ???? ?? 65 ????? ?? ????. ?? ???? ?? ??? ?????? ??????. ???? ????? ????? ?? ??????? ?????? ??????????. ???? ????? ????? ?? ??????? ?????? ?????. ?? ?????? ?????? ?? ???????? ???? ????? ??? ?????? ?? ???: ?????? ???????? ??? ??????. ??? ???? ????? ?? ?????. ????? ?? ??? ?????? ???????. ??? ?????? ??? ??????. ???? ?? ?? ????? ?? ?????? ??????. ??? ???? ?? ??? ???? ?? ???? ????? ?? ???? ????? ?? ??????? ?? ???????. ????? ?? ?????? ??? ????????. ????? ?? ????? ??????? ?????. ??? ????? ??? ??????? ???? ????? ??? ?????? ???????? ???????: ????? ?????? ????????. ??????? ???? ?????? ??? ???? ??????? ?????? ????? ???? ???? ?????? ?? ???????? ?? ????? ??? ?????????? ??????. ?????? ?? ???? ??????? ?????????? ?????? (PSA). ???? ????? ???? ??? ???? ?? ????? ?????????? ?????? ??? ??????????? (???? ??????????). ??? ?????? ????? ??????? ???????? ??? ??????? ??? ?????????. ????? ????? ??????? ????? ???? ?????? ??? ?????? ???????. ???? ??? ????? ????? ???????. ?? ????? ????? ????? ??????? ??? ???? ???????? ???: ????? ????? ??????. CT (?????? ???????? ?????????). ??????? ??????? ???????? ???????????. MRI (?????????? ?????????? ?????????????). ????? ??? ??????? ???????? ?????? ??????????: ??????? ??????? ????? ?? ???? ??????? ??? ?? ?????????? ???. ??? ???? ?? ?????? ????????? ??????? ????? ?????????? ?? ????? ????? ?? ?????????? ?????. ??????? ???????. ????? ???? ??????? ?? ??? ??? ????? ?????? ?? ??????? ??????? ????? ?? ????? ???? ??????????. ??????? ???????. ?? ??? ???????? ???? ??????? ????? ???? ???? ?????? ???????? ?????????? ????? ???  ??????? ????????. ?? ???? ?????? ??? ??????? ?? ????????? ??????? ???? ??? ????? ??????? ???????????. ??????? ???????. ?? ??? ???????? ????? ??????? ??? ????? ???? ?? ?????? ??? ????? ?????????? ?? ?????? ?? ??????? ?? ???????? ?? ????? ?? ???????. ??? ?????? ??? ??????? ????? ???? ??? ?????? ??? ??? ????? ???? ????? ??????? ????? ????????? ??????? ????? ??????. ???? ??? ???? ??????? ?????? ????? ?? ?? ?????? ?????? ?????? ???. ???? ?????? ?????? ???????: ???????? ?????? ?????????? ?? ??????? ??????? (???????? ??????). ????? ??? ??? ????? ???? ????????? ?? ??? ??? ??????? ??? ?????. ??????? ???? ??????? ??? ?? ????? ?????? ??????? ?? ??????. ???????. ???? ????? ??????? ?? ???: ??????? ???????? (??????? ??????????). ?? ??? ???????? ???? ??? ?? ???? ?????? ??????????. ??????????? ????????????? ?? ???? ???????. ???? ??? ??????? ??? ??????? ?????????? ?????? ?????????? ??? ???? ????? ??????. ????? ????? ???? ????????? ????? ????? ??????. ??????? ?????????? ?????. ???? ????? ???????? ??????? ???? ?????????? ?????? ?????????? ??????? ???? ????? ??? ????? ??????? ???. ??????? ??????. ???? ????? ?????? ?????? ????????. ?????? ????????. ???? ????? ?????? ??????? ????????? ????????. ?????? ????????. ????? ????? ?????? ???????? ???: ?????? ???????? ??????? ???????? ???? ???????. ??? ??? ????? ?? ??????? ???? ??????? ?? ???? ????? ???? ?????????? ?????? ??????? ?????????. ???????? ???????. ?????? ??? ????? ??? ????? ?????? ??????? ??????? ???? ????? ?? ??? ??????????. ??????? ??? ?? ????? ??????? ??????? ???????? ??? ???????? ??????? ???? ??????? ?????????. ????? ??? ????? ?? ??????? ??? ???? ??????? ??? ??????? ??????? ?????? ????? ?????? ???. ??????? ???????? ??? ??????? ??????? ????? ???????. ???? ??? ?????? ??????? ????????? ?????? ??????? ??? ??????? ????? ?????? ??? ??????? ?????????. ????? ?????? ?????????. ???? ??? ?????? ??????? ????????? ?? ???? ???????. ???? ??????? ????????? ???????? ????????. ??????  ????????. ?????? ??? ?????? ??????? ???? ??????? ????????? ??? ????? ??????? ????????. ?????? ??????????. ????? ??? ?????? ??? ????? ????? ???? ??? ????? ???? ?? ??????? ??????? (????????????): ????? ??? ???? ?? ????? ????????????. ???? ???????????? ?? ?????? ??? ??????? ?????????. ???? ????????? ??????? ?? ??????: ????? ??????? ??? ??????? ???? ??????? ??????? ????? ????? ????? ???? ?? ??? ???? ????. ????? ????? ????? ???. ???? ??? ??? ???? ?? ?????. ??? ?? ???????? ??? ?????? ??? ?????? ???????? ?????? ??????????. ??? ?????? ???? ?????? ?????? ?? ???? ??????? ?? ????? ?????? ?????? ?? ??????? ????????. ??? ??? ????? ???? ?????? ??? ????????? ???? ?????? ??????? (??????? ???????). ?? ???? ?????? ????? ?????????? ??? ??????? ???????. ??? ??? ????? ?? ???? ????? ????? ?? ?????. ??? ????? ??? ??? ????? ?????? ??????? ?????????. ????? ????? ?????? ???????? ??? ??????? ???? ??????? ??????. ???? ??? ???. ???? ?????? ??????? ?????? ?? ??????? ???????: ??? ????? ?? ?????? ????? ??? ?????? ?? ????? ??? ????? ?? ??? ??????? ??????. ???? ?? ?? ????? ?? ?????? ?? ??? ??????? ??????. ?????? ???? ???? ?? ?????? ?? ??????? ?? ????? ?? ?????. ???? ???????? ????? ?? ??????? ???????: ?????? ?????? ?? ????? ?? ???????. ??? ????? ??? ?????? ?? ??????? ?? ?????? (?????). ?????? ???????? ?? ???. ???? ?????????? ??? ????? ??? ??? ?? ????? ????? ?????? ??????. ????? ??? ????? ????? ????? ????????. ??? ????? ?????????? ???? ??? ????? ??????? ?? ??? ??????????. ????? ???? ??? ?????? ??? ???????? ???? ??? ????? ??????? ????? ????????? ??????? ????? ??????. ???? ??? ???? ??????? ?????? ????? ?? ?? ?????? ?????? ?????? ???. ??? ?? ???????? ??? ?????? ??? ?????? ???????? ?????? ??????????. ?????? ?? ???? ?????? ?????? ?? ?????? ?? ???? ??????? ?? ????? ?????? ?????? ?? ??????? ????????. ??? ????? ?? ??? ????????? ?? ???? ?????? ????????? ???? ?????? ???? ?????????????. ???? ?? ?????? ??? ????? ???? ?? ???? ?? ????  ??????? ??????[name]?  Document Revised: 08/04/2019 Document Reviewed: 08/04/2019 Elsevier Patient Education  2022 [employer].

## 2020-12-25 NOTE — Progress Notes (Signed)
12/25/2020 2:12 PM   [name] [birth year] [id number]  Referring provider: Caryl Bis, MD [place],  [place] [address]  Followup prostate cancer   HPI: [name] is a 55yo here for followup for prostate cancer. He cancelled his appointment with Dr. Tammi Klippel and wants to pursue radical prostatectomy. Ct stone study showed no evidence of metastatic disease. Bone scan showed a defect in his sternum but the patient has had CABG in the past   PMH: Past Medical History:  Diagnosis Date   Anaphylaxis    IGE mediated   Anxiety    Arthritis    Cancer ([place])    Prostate   Cardiomyopathy ([place])    CHF (congestive heart failure) ([place])    COPD (chronic obstructive pulmonary disease) ([place])    Coronary atherosclerosis of native coronary artery    Multivessel status post CABG 2010   Depression    Essential hypertension    History of CVA (cerebrovascular accident) 01/2012   Right posterior frontal cortical and subcortical brain by MRI, no hemorrhage. Carotid Dopplers showed only 1-50% bilateral ICA stenoses. Echocardiogram showed LVEF 50%, no major valvular abnormalities.   Mixed hyperlipidemia    Myocardial infarction ([place]) 2010   OSA (obstructive sleep apnea)    Stroke ([place])    Type 2 diabetes mellitus ([place])     Surgical History: Past Surgical History:  Procedure Laterality Date   CHOLECYSTECTOMY     CORONARY ARTERY BYPASS GRAFT  2010   LIMA to LAD, SVG to diagonal, SVG to OM1 and OM 2, SVG to RCA   DENTAL SURGERY  2003   GASTRIC BYPASS  2010   HERNIA REPAIR  2011, 2012   [place] N/A 07/23/2013   Procedure: HERNIA REPAIR INCISIONAL WITH MESH;  Surgeon: Jamesetta So, MD;  Location: [facility];  Service: General;  Laterality: N/A;   INSERTION OF MESH N/A 07/23/2013   Procedure: INSERTION OF MESH;  Surgeon: Jamesetta So, MD;  Location: [facility];  Service: General;  Laterality: N/A;   LEFT HEART CATHETERIZATION WITH CORONARY/GRAFT ANGIOGRAM N/A  11/01/2013   Procedure: LEFT HEART CATHETERIZATION WITH Beatrix Fetters;  Surgeon: Blane Ohara, MD;  Location: [facility] CATH LAB;  Service: Cardiovascular;  Laterality: N/A;   TOE AMPUTATION  1998   right 1st and 2nd toe   TONSILECTOMY, ADENOIDECTOMY, BILATERAL MYRINGOTOMY AND TUBES     TONSILLECTOMY     VENTRAL HERNIA REPAIR N/A 10/28/2012   Procedure: LAPAROSCOPIC VENTRAL HERNIA;  Surgeon: Donato Heinz, MD;  Location: [facility];  Service: General;  Laterality: N/A;    Home Medications:  Allergies as of 12/25/2020       Reactions   Contrast Media [iodinated Diagnostic Agents] Anaphylaxis, Shortness Of Breath, Swelling, Rash   Isovue contrast is most acceptable agent based on previous experience and testing with premedications   Ibuprofen Anaphylaxis, Hives, Swelling   Nsaids Anaphylaxis, Hives, Swelling, Rash   Can take Aspirin 325 mg or lower   Other Other (See Comments)   Anti-physcotics Personality change        Medication List        Accurate as of December 25, 2020  2:12 PM. If you have any questions, ask your nurse or doctor.          albuterol 108 (90 Base) MCG/ACT inhaler Commonly known as: VENTOLIN HFA Inhale 2 puffs into the lungs every 6 (six) hours as needed for wheezing or shortness of breath.  ALPRAZolam 1 MG tablet Commonly known as: XANAX 0.5-1 mg daily as needed for anxiety   aspirin EC 81 MG tablet Take 1 tablet (81 mg total) by mouth daily.   atorvastatin 40 MG tablet Commonly known as: LIPITOR Take 1 tablet by mouth daily.   baclofen 20 MG tablet Commonly known as: LIORESAL Take 1 tablet (20 mg total) by mouth 2 (two) times daily.   carvedilol 6.25 MG tablet Commonly known as: COREG Take 6.25 mg by mouth 2 (two) times daily with a meal.   clopidogrel 75 MG tablet Commonly known as: PLAVIX Take 1 tablet by mouth daily.   dapsone 25 MG tablet   diltiazem 240 MG 24 hr capsule Commonly known as: CARDIZEM CD Take 240 mg by mouth  daily.   DULoxetine 30 MG capsule Commonly known as: CYMBALTA Take 90 mg daily (60 mg + 30 mg)   DULoxetine 60 MG capsule Commonly known as: CYMBALTA Take 90 mg daily (60 mg + 30 mg)   Entresto 49-51 MG Generic drug: sacubitril-valsartan TAKE (1) TABLET TWICE DAILY.   fenofibrate micronized 134 MG capsule Commonly known as: LOFIBRA Take 134 mg by mouth daily before breakfast.   fluticasone 0.05 % cream Commonly known as: CUTIVATE Apply 1 application topically 2 (two) times daily.   furosemide 20 MG tablet Commonly known as: LASIX Take 20 mg by mouth daily.   gabapentin 600 MG tablet Commonly known as: NEURONTIN Take 1 tablet by mouth 2 (two) times daily.   isosorbide mononitrate 60 MG 24 hr tablet Commonly known as: IMDUR Take 60 mg by mouth 2 (two) times daily.   levofloxacin 750 MG tablet Commonly known as: Levaquin Take 1 tablet (750 mg total) by mouth daily.   nitroGLYCERIN 0.4 MG SL tablet Commonly known as: NITROSTAT Place 0.4 mg under the tongue every 5 (five) minutes as needed. For chest pain   oxyCODONE 5 MG immediate release tablet Commonly known as: Oxy IR/ROXICODONE Take 5 mg by mouth every 6 (six) hours as needed for severe pain.   oxyCODONE-acetaminophen 7.5-325 MG tablet Commonly known as: PERCOCET   Semaglutide (1 MG/DOSE) 2 MG/1.5ML Sopn Inject into the skin once a week.   silver sulfADIAZINE 1 % cream Commonly known as: SILVADENE   tamsulosin 0.4 MG Caps capsule Commonly known as: FLOMAX Take 1 capsule (0.4 mg total) by mouth daily after supper.   tiZANidine 2 MG tablet Commonly known as: ZANAFLEX Take 2 mg by mouth once.   traZODone 50 MG tablet Commonly known as: DESYREL 25-50 mg at night as needed for sleep   triamcinolone cream 0.1 % Commonly known as: KENALOG Apply 1 application topically 2 (two) times daily.   Vitamin D 50 MCG (2000 UT) tablet Take 2,000 Units by mouth daily at 12 noon.        Allergies:  Allergies   Allergen Reactions   Contrast Media [Iodinated Diagnostic Agents] Anaphylaxis, Shortness Of Breath, Swelling and Rash    Isovue contrast is most acceptable agent based on previous experience and testing with premedications   Ibuprofen Anaphylaxis, Hives and Swelling   Nsaids Anaphylaxis, Hives, Swelling and Rash    Can take Aspirin 325 mg or lower   Other Other (See Comments)    Anti-physcotics Personality change    Family History: Family History  Problem Relation Age of Onset   Lung cancer Father    Heart disease Father    Heart disease Mother    Congestive Heart Failure Mother  Diabetes Mother    Hyperlipidemia Mother    Alcohol abuse Brother    Breast cancer Maternal Aunt    Suicidality Cousin     Social History:  reports that he has been smoking cigarettes. He started smoking about 38 years ago. He has a 45.00 pack-year smoking history. He has never used smokeless tobacco. He reports that he does not drink alcohol and does not use drugs.  ROS: All other review of systems were reviewed and are negative except what is noted above in HPI  Physical Exam: BP 103/63   Pulse 84   Constitutional:  Alert and oriented, No acute distress. HEENT: [place] AT, moist mucus membranes.  Trachea midline, no masses. Cardiovascular: No clubbing, cyanosis, or edema. Respiratory: Normal respiratory effort, no increased work of breathing. GI: Abdomen is soft, nontender, nondistended, no abdominal masses GU: No CVA tenderness.  Lymph: No cervical or inguinal lymphadenopathy. Skin: No rashes, bruises or suspicious lesions. Neurologic: Grossly intact, no focal deficits, moving all 4 extremities. Psychiatric: Normal mood and affect.  Laboratory Data: Lab Results  Component Value Date   WBC 13.4 (H) 07/28/2017   HGB 14.9 07/28/2017   HCT 44.6 07/28/2017   MCV 86.8 07/28/2017   PLT 254 07/28/2017    Lab Results  Component Value Date   CREATININE 1.09 07/28/2017    No results found  for: PSA  No results found for: TESTOSTERONE  Lab Results  Component Value Date   HGBA1C 6.0 (H) 10/31/2013    Urinalysis    Component Value Date/Time   COLORURINE YELLOW 06/21/2013 2323   APPEARANCEUR Clear 09/29/2020 1324   LABSPEC 1.020 06/21/2013 2323   PHURINE 7.0 06/21/2013 2323   GLUCOSEU Negative 09/29/2020 1324   HGBUR TRACE (A) 06/21/2013 2323   BILIRUBINUR Negative 09/29/2020 1324   KETONESUR NEGATIVE 06/21/2013 2323   PROTEINUR Trace (A) 09/29/2020 1324   PROTEINUR NEGATIVE 06/21/2013 2323   UROBILINOGEN 0.2 06/21/2013 2323   NITRITE Negative 09/29/2020 1324   NITRITE NEGATIVE 06/21/2013 2323   LEUKOCYTESUR Negative 09/29/2020 1324    Lab Results  Component Value Date   LABMICR See below: 09/29/2020   WBCUA None seen 09/29/2020   LABEPIT None seen 09/29/2020   MUCUS Present 09/29/2020   BACTERIA Few 09/29/2020    Pertinent Imaging: Ct and [place] scan: Images reviewed and discussed with the patient Results for orders placed during the hospital encounter of 02/19/07  DG Abd 1 View  Narrative History: Left renal calculus, status post lithotripsy  ABDOMEN ONE VIEW:  Tiny residual stone fragment inferior pole left kidney, 1.5 mm diameter. No distal ureteral calculi evident. Tiny phleboliths in pelvis bilaterally stable. Bones unremarkable. Bowel gas pattern normal.  IMPRESSION: Single tiny residual stone fragment inferior pole left kidney.  Provider: Vaughan Basta, [place] [facility]  No results found for this or any previous visit.  No results found for this or any previous visit.  No results found for this or any previous visit.  No results found for this or any previous visit.  No results found for this or any previous visit.  No results found for this or any previous visit.  Results for orders placed in visit on 11/08/20  CT RENAL STONE STUDY  Narrative CLINICAL DATA:  Prostate cancer  EXAM: CT ABDOMEN AND PELVIS WITHOUT  CONTRAST  TECHNIQUE: Multidetector CT imaging of the abdomen and pelvis was performed following the standard protocol without IV contrast.  COMPARISON:  06/22/2013  FINDINGS: Lower chest: No acute abnormality.  Hepatobiliary:  No focal liver abnormality is seen. Status post cholecystectomy. No biliary dilatation.  Pancreas: Unremarkable. No pancreatic ductal dilatation or surrounding inflammatory changes.  Spleen: Normal in size without significant abnormality.  Adrenals/Urinary Tract: Adrenal glands are unremarkable. Kidneys are normal, without renal calculi, solid lesion, or hydronephrosis. Bladder is unremarkable.  Stomach/Bowel: Stomach is within normal limits. Unusual configuration of the colon, with the cecum located in the left hemiabdomen. There is however no congenital malrotation. Appendix appears normal. No evidence of bowel wall thickening, distention, or inflammatory changes.  Vascular/Lymphatic: Aortic atherosclerosis. No enlarged abdominal or pelvic lymph nodes.  Reproductive: No mass or other significant abnormality.  Other: Status post midline ventral epigastric hernia repair. No abdominopelvic ascites.  Musculoskeletal: No acute or significant osseous findings.  IMPRESSION: No noncontrast evidence of mass, lymphadenopathy, or metastatic disease in the abdomen or pelvis.  Aortic Atherosclerosis (ICD10-I70.0).   Electronically Signed By: Eddie Candle M.D. On: 12/09/2020 16:52   Assessment & Plan:    1. Prostate cancer [facility]) -patient wishes to pursue radical prostatectomy for treatment of his prostate cancer. I will arrange a consult with Dr. Tresa Moore. I counseled that he has multiple medical comorbidities and may not be a candidate for surgery - Urinalysis, Routine w reflex microscopic   No follow-ups on file.  Nicolette Bang, MD  [facility] [place]

## 2020-12-25 NOTE — Progress Notes (Signed)

## 2020-12-28 DIAGNOSIS — Z79899 Other long term (current) drug therapy: Secondary | ICD-10-CM | POA: Diagnosis not present

## 2020-12-28 DIAGNOSIS — E559 Vitamin D deficiency, unspecified: Secondary | ICD-10-CM | POA: Diagnosis not present

## 2020-12-28 DIAGNOSIS — R635 Abnormal weight gain: Secondary | ICD-10-CM | POA: Diagnosis not present

## 2021-01-01 ENCOUNTER — Other Ambulatory Visit: Payer: Self-pay | Admitting: Physician Assistant

## 2021-01-03 ENCOUNTER — Other Ambulatory Visit: Payer: Self-pay | Admitting: Physician Assistant

## 2021-01-03 ENCOUNTER — Telehealth: Payer: Self-pay | Admitting: Cardiology

## 2021-01-03 MED ORDER — ATORVASTATIN CALCIUM 40 MG PO TABS: 1.0000 | ORAL_TABLET | Freq: Every day | ORAL | 6 refills | Status: DC

## 2021-01-03 NOTE — Telephone Encounter (Signed)
New message    *STAT* If patient is at the pharmacy, call can be transferred to refill team.   1. Which medications need to be refilled? (please list name of each medication and dose if known) atorvastatin (LIPITOR) 40 MG tablet  2. Which pharmacy/location (including street and city if local pharmacy) is medication to be sent to? [facility]  3. Do they need a 30 day or 90 day supply? [place]

## 2021-01-07 NOTE — Progress Notes (Signed)
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  I have again asked that he contact his insurance company to see if they would cover occipital nerve block. 3.  Follow up 6 months.  Subjective:  Ikaika Showers is a 55 year old right-handed man with hypertension, type 2 diabetes mellitus, CAD, ischemic cardiomyopathy, COPD, tobacco use disorder, depression and history of prior strokes who follows up for migraines.   UPDATE: Current medications:  Plavix 75mg , atorvastatin 80mg , Coreg, Imdur, Lasix, alprazolam 1mg  PRN, baclofen 10mg  BID PRN, Cymbalta 90mg  daily, gabapentin 600mg  BID  MRI of cervical spine on 08/08/2020 personally reviewed showed mild cervical spondylosis with mild spinal stenosis and mild to moderate neural foraminal stenosis at C3-4 and C4-5.   Headaches are worse.  Mild headaches occur twice daily for 30 to 60 minutes;  but he has one severe headache once a week.  He reports right occipital/suboccipital numbness and tingling off and on.       HISTORY: He was admitted to [facility] on 06/14/17 with sudden onset left sided weakness and numbness and difficulty speaking.  He initially presented to [facility] where he received tPA with NIHSS of 15 and was then transferred to [facility].    CT of head from [facility] reportedly showed "acute infarction involving portions of the posterior right basal ganglia, right corona radiata and body of the right caudate nucleus".  MRI of brain at [facility] confirmed acute right basal ganglia infarct as well as  remote right frontal infarct.  MRA of head and carotid doppler revealed no significant intracranial or extracranial arterial stenosis or occlusion.  He was unable to have CTA due to allergy to iodinated contrast.    Echocardiogram with bubble study showed EF of 25-30% with no evidence of PFO or ASD.  He was evaluated by cardiology for ischemic cardiomyopathy.  Lexiscan nuclear perfusion study revealed EF 18% with large infarct involving the apex and periapical anterior wall with no evidence of ischemia.  He was advised to follow up with outpatient cardiology.  LDL was 123 and triglycerides were 237.  Hgb A1c was 5.7.  He was already taking ASA 325mg  but not daily, as well as Plavix.  He is continued on ASA and Plavix.  06/30/2019 Echocardiogram:  LV EF 40-50%   He was seen in the ED at [facility] on 09/22/17 for headache.  CT of head showed no acute findings but was read as demonstrating a chronic appearing infarct in the right posterior limb of internal capsule that was not present on prior CT from 06/14/17.  He also complained of left leg pain.  He reportedly had an elevated d dimer.  CT chest and venous doppler of lower extremity were negative for DVT.  However, the started him on Xarelto.  He was told to stop Plavix by his PCP.  I contacted his PCP, Dr. Quillian Quince, who reviewed the ED notes and is also unsure why they started Xarelto when imaging was negative for DVT.  He was advised to discontinue Xarelto and restart Plavix with aspirin.   Headaches returned in December 2020.  Right sided 10/10 throbbing/pressure pain in the temple radiating to back of head.  Associated nausea, blurred vision, photophobia.  They last 2 hours and occur 2 to 3 days a week.  He takes Tylenol.  For the past month, he also notes that his right hand twitches about every other day, lasting 20-30 minutes.  It may occur while holding a cup but also at rest.  No recent change or added medications.  He reported cough  and loss of taste.  He was concerned about Covid-19 but taste returned and cough resolved after 1 to 2 days.  Due to worsening headache, MRI of brain without contrast was performed on 09/13/2019, which was personally reviewed, and demonstrated stable advanced chronic small vessel ischemic changes but no acute intracranial abnormality. Due to right hand myoclonus, EEG was performed on 08/17/2019, which was normal.   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  He did go to PT and speech therapy.     He sees Dr. Letta Pate for Dysport injection to treat spasticity.  He reports panic attacks.  He sees Dr. Toy Care for depression and anxiety.   PAST MEDICAL HISTORY: Past Medical History:  Diagnosis Date   Anaphylaxis    IGE mediated   Anxiety    Arthritis    Cancer ([place])    Prostate   Cardiomyopathy ([place])    CHF (congestive heart failure) ([facility])    COPD (chronic obstructive pulmonary disease) ([place])    Coronary atherosclerosis of native coronary artery    Multivessel status post CABG 2010   Depression    Essential hypertension    History of CVA (cerebrovascular accident) 01/2012   Right posterior frontal cortical and subcortical brain by MRI, no hemorrhage. Carotid Dopplers showed only 1-50% bilateral ICA stenoses. Echocardiogram showed LVEF 50%, no major valvular abnormalities.   Mixed hyperlipidemia    Myocardial infarction ([place]) 2010   OSA (obstructive sleep apnea)    Stroke ([facility])    Type 2 diabetes mellitus ([facility])     MEDICATIONS: Current Outpatient Medications on File Prior to Visit  Medication Sig Dispense Refill   albuterol (PROVENTIL HFA;VENTOLIN HFA) 108 (90 BASE) MCG/ACT inhaler Inhale 2 puffs into the lungs every 6 (six) hours as needed for wheezing or shortness of breath.     ALPRAZolam (XANAX) 1 MG tablet 0.5-1 mg daily as needed for  anxiety 30 tablet 2   aspirin EC 81 MG tablet Take 1 tablet (81 mg total) by mouth daily. 90 tablet 3   atorvastatin (LIPITOR) 40 MG tablet Take 1 tablet (40 mg total) by mouth daily. 30 tablet 6   baclofen (LIORESAL) 20 MG tablet Take 1 tablet (20 mg total) by mouth 2 (two) times daily. 60 tablet 5   carvedilol (COREG) 6.25 MG tablet Take 6.25 mg by mouth 2 (two) times daily with a meal.     Cholecalciferol (VITAMIN D) 2000 units tablet Take 2,000 Units by mouth daily at 12 noon.     clopidogrel (PLAVIX) 75 MG tablet Take 1 tablet by mouth daily.     dapsone 25 MG tablet      diltiazem (CARDIZEM CD) 240 MG 24 hr capsule Take 240 mg by mouth daily.      DULoxetine (CYMBALTA) 30 MG capsule Take 90 mg daily (60 mg + 30 mg) 90 capsule 0   DULoxetine (CYMBALTA) 60 MG capsule Take 90 mg daily (60 mg + 30 mg) 90 capsule 0   ENTRESTO 49-51 MG TAKE (1) TABLET TWICE DAILY. [place]  tablet 3   fenofibrate micronized (LOFIBRA) 134 MG capsule Take 134 mg by mouth daily before breakfast.      fluticasone (CUTIVATE) 0.05 % cream Apply 1 application topically 2 (two) times daily.     furosemide (LASIX) 20 MG tablet Take 20 mg by mouth daily.      gabapentin (NEURONTIN) 600 MG tablet Take 1 tablet by mouth 2 (two) times daily.      isosorbide mononitrate (IMDUR) 60 MG 24 hr tablet Take 60 mg by mouth 2 (two) times daily.      levofloxacin (LEVAQUIN) 750 MG tablet Take 1 tablet (750 mg total) by mouth daily. 1 tablet 0   nitroGLYCERIN (NITROSTAT) 0.4 MG SL tablet Place 0.4 mg under the tongue every 5 (five) minutes as needed. For chest pain     oxyCODONE (OXY IR/ROXICODONE) 5 MG immediate release tablet Take 5 mg by mouth every 6 (six) hours as needed for severe pain.      oxyCODONE-acetaminophen (PERCOCET) 7.5-325 MG tablet      Semaglutide, 1 MG/DOSE, 2 MG/1.5ML SOPN Inject into the skin once a week.     silver sulfADIAZINE (SILVADENE) 1 % cream      tamsulosin (FLOMAX) 0.4 MG CAPS capsule Take 1 capsule (0.4 mg  total) by mouth daily after supper. 30 capsule 11   tiZANidine (ZANAFLEX) 2 MG tablet Take 2 mg by mouth once.      traZODone (DESYREL) 50 MG tablet 25-50 mg at night as needed for sleep 30 tablet 2   triamcinolone cream (KENALOG) 0.1 % Apply 1 application topically 2 (two) times daily.      No current facility-administered medications on file prior to visit.    ALLERGIES: Allergies  Allergen Reactions   Contrast Media [Iodinated Diagnostic Agents] Anaphylaxis, Shortness Of Breath, Swelling and Rash    Isovue contrast is most acceptable agent based on previous experience and testing with premedications   Ibuprofen Anaphylaxis, Hives and Swelling   Nsaids Anaphylaxis, Hives, Swelling and Rash    Can take Aspirin 325 mg or lower   Other Other (See Comments)    Anti-physcotics Personality change    FAMILY HISTORY: Family History  Problem Relation Age of Onset   Lung cancer Father    Heart disease Father    Heart disease Mother    Congestive Heart Failure Mother    Diabetes Mother    Hyperlipidemia Mother    Alcohol abuse Brother    Breast cancer Maternal Aunt    Suicidality Cousin       Objective:  Blood pressure 102/64, pulse 72, height 5\' 11"  (1.803 m), weight 226 lb (102.5 kg), SpO2 97 %. General: No acute distress.  Patient appears well-groomed.   Head:  Normocephalic/atraumatic Eyes:  Fundi examined but not visualized Neck: supple, no paraspinal tenderness, full range of motion Heart:  Regular rate and rhythm Lungs:  Clear to auscultation bilaterally Back: No paraspinal tenderness Neurological Exam: alert and oriented to person, place, and time. Speech fluent and not dysarthric, language intact.  Decreased left V2.  Otherwise, CN II-XII intact. Bulk and tone flaccid in left upper extremity.  Muscle strength plegic left upper extremity except 2+/5 hand grip, 2+/5 left hip flexion, plegic left knee flexion, left foot drop, left knee extension; 5/5 right upper and lower  extremities.  Sensation to pinprick decreased in left upper and lower extremity, decreased vibratory sensation bilateral lower extremities.  Deep tendon reflexes 3+ left upper and lower extremities with sustained clonus in left  ankle, 2+ on right.  Finger to nose testing intact on right, unable to assess left.  Gait:  In wheelchair.   Metta Clines, DO  CC: Gar Ponto, MD

## 2021-01-08 ENCOUNTER — Encounter: Payer: Self-pay | Admitting: Neurology

## 2021-01-08 ENCOUNTER — Other Ambulatory Visit: Payer: Self-pay

## 2021-01-08 ENCOUNTER — Ambulatory Visit (INDEPENDENT_AMBULATORY_CARE_PROVIDER_SITE_OTHER): Payer: Medicare HMO | Admitting: Neurology

## 2021-01-08 VITALS — BP 102/64 | HR 72 | Ht 71.0 in | Wt 226.0 lb

## 2021-01-08 DIAGNOSIS — E119 Type 2 diabetes mellitus without complications: Secondary | ICD-10-CM | POA: Diagnosis not present

## 2021-01-08 DIAGNOSIS — R419 Unspecified symptoms and signs involving cognitive functions and awareness: Secondary | ICD-10-CM

## 2021-01-08 DIAGNOSIS — E782 Mixed hyperlipidemia: Secondary | ICD-10-CM | POA: Diagnosis not present

## 2021-01-08 DIAGNOSIS — N184 Chronic kidney disease, stage 4 (severe): Secondary | ICD-10-CM | POA: Diagnosis not present

## 2021-01-08 DIAGNOSIS — I69354 Hemiplegia and hemiparesis following cerebral infarction affecting left non-dominant side: Secondary | ICD-10-CM | POA: Diagnosis not present

## 2021-01-08 DIAGNOSIS — E8881 Metabolic syndrome: Secondary | ICD-10-CM | POA: Diagnosis not present

## 2021-01-08 DIAGNOSIS — M542 Cervicalgia: Secondary | ICD-10-CM | POA: Diagnosis not present

## 2021-01-08 DIAGNOSIS — E7849 Other hyperlipidemia: Secondary | ICD-10-CM | POA: Diagnosis not present

## 2021-01-08 DIAGNOSIS — K2100 Gastro-esophageal reflux disease with esophagitis, without bleeding: Secondary | ICD-10-CM | POA: Diagnosis not present

## 2021-01-08 DIAGNOSIS — M5481 Occipital neuralgia: Secondary | ICD-10-CM | POA: Diagnosis not present

## 2021-01-08 DIAGNOSIS — Z1329 Encounter for screening for other suspected endocrine disorder: Secondary | ICD-10-CM | POA: Diagnosis not present

## 2021-01-08 MED ORDER — PREGABALIN 75 MG PO CAPS: 75.0000 mg | ORAL_CAPSULE | Freq: Two times a day (BID) | ORAL | 5 refills | Status: DC

## 2021-01-08 NOTE — Patient Instructions (Signed)
Stop gabapentin.  Start pregablin 75mg  twice daily for nerve pain and headache.  We can increase dose in 8 weeks if needed. Contact your insurance to see if they would cover occipital nerve block for occipital neuralgia. Follow up 6 months.

## 2021-01-09 ENCOUNTER — Telehealth: Payer: Self-pay | Admitting: Neurology

## 2021-01-09 NOTE — Telephone Encounter (Signed)
Patient called said the pharmacy did not get his new medication yesterday that will take the place of his Neurontin. He is not sure what the name of it is.  [facility] in [place]

## 2021-01-10 ENCOUNTER — Other Ambulatory Visit: Payer: Self-pay | Admitting: Neurology

## 2021-01-10 MED ORDER — PREGABALIN 75 MG PO CAPS: 75.0000 mg | ORAL_CAPSULE | Freq: Two times a day (BID) | ORAL | 5 refills | Status: DC

## 2021-01-11 ENCOUNTER — Encounter: Payer: Medicare HMO | Attending: Physical Medicine & Rehabilitation | Admitting: Physical Medicine & Rehabilitation

## 2021-01-11 ENCOUNTER — Encounter: Payer: Self-pay | Admitting: Physical Medicine & Rehabilitation

## 2021-01-11 ENCOUNTER — Other Ambulatory Visit: Payer: Self-pay

## 2021-01-11 VITALS — BP 108/71 | HR 79 | Temp 98.7°F | Ht 71.0 in | Wt 226.0 lb

## 2021-01-11 DIAGNOSIS — I5023 Acute on chronic systolic (congestive) heart failure: Secondary | ICD-10-CM | POA: Diagnosis not present

## 2021-01-11 DIAGNOSIS — G8110 Spastic hemiplegia affecting unspecified side: Secondary | ICD-10-CM | POA: Insufficient documentation

## 2021-01-11 DIAGNOSIS — E1122 Type 2 diabetes mellitus with diabetic chronic kidney disease: Secondary | ICD-10-CM | POA: Diagnosis not present

## 2021-01-11 DIAGNOSIS — N184 Chronic kidney disease, stage 4 (severe): Secondary | ICD-10-CM | POA: Diagnosis not present

## 2021-01-11 DIAGNOSIS — I130 Hypertensive heart and chronic kidney disease with heart failure and stage 1 through stage 4 chronic kidney disease, or unspecified chronic kidney disease: Secondary | ICD-10-CM | POA: Diagnosis not present

## 2021-01-11 NOTE — Patient Instructions (Signed)
Dysport Injection for spasticity using needle EMG guidance

## 2021-01-11 NOTE — Progress Notes (Signed)
Dysport Injection for spasticity using needle EMG guidance  Dilution: 200 Units/ml Indication: Severe spasticity which interferes with ADL,mobility and/or  hygiene and is unresponsive to medication management and other conservative care Informed consent was obtained after describing risks and benefits of the procedure with the patient. This includes bleeding, bruising, infection, excessive weakness, or medication side effects. A REMS form is on file and signed. Needle:  needle electrode Number of units per muscle LUE FDS 100- 3+ FDP 300-2+   Biceps 200      Trunk Left pec  100U    LLE   Med gastroc 300U   Tibialis post  300U   Lat gastroc  200U All injections were done after obtaining appropriate EMG activity and after negative drawback for blood. The patient tolerated the procedure well. Post procedure instructions were given. A followup appointment was made.   Based on EMG activity would consider increasing FDS to 200 reduce FDP to 200 and reduce gastroc to 300U and Add soleus to 200U

## 2021-01-12 DIAGNOSIS — E7849 Other hyperlipidemia: Secondary | ICD-10-CM | POA: Diagnosis not present

## 2021-01-12 DIAGNOSIS — N1831 Chronic kidney disease, stage 3a: Secondary | ICD-10-CM | POA: Diagnosis not present

## 2021-01-12 DIAGNOSIS — G8102 Flaccid hemiplegia affecting left dominant side: Secondary | ICD-10-CM | POA: Diagnosis not present

## 2021-01-12 DIAGNOSIS — I10 Essential (primary) hypertension: Secondary | ICD-10-CM | POA: Diagnosis not present

## 2021-01-12 DIAGNOSIS — J449 Chronic obstructive pulmonary disease, unspecified: Secondary | ICD-10-CM | POA: Diagnosis not present

## 2021-01-12 DIAGNOSIS — Z23 Encounter for immunization: Secondary | ICD-10-CM | POA: Diagnosis not present

## 2021-01-12 DIAGNOSIS — J4530 Mild persistent asthma, uncomplicated: Secondary | ICD-10-CM | POA: Diagnosis not present

## 2021-01-12 DIAGNOSIS — R972 Elevated prostate specific antigen [PSA]: Secondary | ICD-10-CM | POA: Diagnosis not present

## 2021-01-18 DIAGNOSIS — Z515 Encounter for palliative care: Secondary | ICD-10-CM | POA: Diagnosis not present

## 2021-01-18 DIAGNOSIS — I10 Essential (primary) hypertension: Secondary | ICD-10-CM | POA: Diagnosis not present

## 2021-01-18 DIAGNOSIS — R972 Elevated prostate specific antigen [PSA]: Secondary | ICD-10-CM | POA: Diagnosis not present

## 2021-01-18 DIAGNOSIS — I69354 Hemiplegia and hemiparesis following cerebral infarction affecting left non-dominant side: Secondary | ICD-10-CM | POA: Diagnosis not present

## 2021-01-18 DIAGNOSIS — G8929 Other chronic pain: Secondary | ICD-10-CM | POA: Diagnosis not present

## 2021-01-18 DIAGNOSIS — Z993 Dependence on wheelchair: Secondary | ICD-10-CM | POA: Diagnosis not present

## 2021-01-18 DIAGNOSIS — C61 Malignant neoplasm of prostate: Secondary | ICD-10-CM | POA: Diagnosis not present

## 2021-01-24 DIAGNOSIS — I639 Cerebral infarction, unspecified: Secondary | ICD-10-CM | POA: Diagnosis not present

## 2021-01-24 DIAGNOSIS — E1165 Type 2 diabetes mellitus with hyperglycemia: Secondary | ICD-10-CM | POA: Diagnosis not present

## 2021-01-24 DIAGNOSIS — F1120 Opioid dependence, uncomplicated: Secondary | ICD-10-CM | POA: Diagnosis not present

## 2021-01-24 DIAGNOSIS — Z6831 Body mass index (BMI) 31.0-31.9, adult: Secondary | ICD-10-CM | POA: Diagnosis not present

## 2021-01-24 DIAGNOSIS — G894 Chronic pain syndrome: Secondary | ICD-10-CM | POA: Diagnosis not present

## 2021-02-11 DIAGNOSIS — I130 Hypertensive heart and chronic kidney disease with heart failure and stage 1 through stage 4 chronic kidney disease, or unspecified chronic kidney disease: Secondary | ICD-10-CM | POA: Diagnosis not present

## 2021-02-11 DIAGNOSIS — E1122 Type 2 diabetes mellitus with diabetic chronic kidney disease: Secondary | ICD-10-CM | POA: Diagnosis not present

## 2021-02-11 DIAGNOSIS — N184 Chronic kidney disease, stage 4 (severe): Secondary | ICD-10-CM | POA: Diagnosis not present

## 2021-02-11 DIAGNOSIS — I5023 Acute on chronic systolic (congestive) heart failure: Secondary | ICD-10-CM | POA: Diagnosis not present

## 2021-02-15 DIAGNOSIS — L732 Hidradenitis suppurativa: Secondary | ICD-10-CM | POA: Diagnosis not present

## 2021-02-19 DIAGNOSIS — K429 Umbilical hernia without obstruction or gangrene: Secondary | ICD-10-CM | POA: Diagnosis not present

## 2021-02-19 DIAGNOSIS — C61 Malignant neoplasm of prostate: Secondary | ICD-10-CM | POA: Diagnosis not present

## 2021-02-22 ENCOUNTER — Other Ambulatory Visit: Payer: Self-pay

## 2021-02-22 ENCOUNTER — Encounter: Payer: Medicare HMO | Attending: Physical Medicine & Rehabilitation | Admitting: Physical Medicine & Rehabilitation

## 2021-02-22 ENCOUNTER — Encounter: Payer: Self-pay | Admitting: Physical Medicine & Rehabilitation

## 2021-02-22 VITALS — BP 103/67 | HR 79 | Temp 98.5°F | Ht 71.0 in | Wt 239.0 lb

## 2021-02-22 DIAGNOSIS — S43002S Unspecified subluxation of left shoulder joint, sequela: Secondary | ICD-10-CM | POA: Diagnosis not present

## 2021-02-22 DIAGNOSIS — G8110 Spastic hemiplegia affecting unspecified side: Secondary | ICD-10-CM

## 2021-02-22 NOTE — Patient Instructions (Signed)
Referral to OT for a special sling and for shoulder subluxation

## 2021-02-22 NOTE — Progress Notes (Signed)
Subjective:    Patient ID: [name], male    DOB: 1966-01-19, 55 y.o.   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 The patient is here for spasticity management follow-up.  Injection performed 6 weeks ago using Dysport Oral medications include baclofen 20 mg twice daily Patient's primary complaint today is left shoulder subluxation.  He has difficulty with standard slings.  The upper extremity tone causes him to unwrap the Velcro straps. Pain Inventory LUE FDS 100- 3+ FDP 300-2+   Biceps 200      Trunk Left pec  100U    LLE   Med gastroc 300U   Tibialis post  300U   Lat gastroc  200Uerage Pain 7 Pain Right Now 7 My pain is constant, dull, and tingling  In the last 24 hours, has pain interfered with the following? General activity 0 Relation with others 4 Enjoyment of life 8 What TIME of day is your pain at its worst? morning , daytime, evening, night, and varies Sleep (in general) Poor  Pain is worse with: walking, standing, and some activites Pain improves with: medication Relief from Meds: 4  Family History  Problem Relation Age of Onset   Lung cancer Father    Heart disease Father    Heart disease Mother    Congestive Heart Failure Mother    Diabetes Mother    Hyperlipidemia Mother    Alcohol abuse Brother    Breast cancer Maternal Aunt    Suicidality Cousin    Social History   Socioeconomic History   Marital status: Legally Separated    Spouse name: Jolayne Haines   Number of children: 2   Years of education: Not on file   Highest education level: 12th grade  Occupational History   Occupation: disabled  Tobacco Use   Smoking status: Every Day    Packs/day: 1.50    Years: 30.00    Pack years: 45.00    Types: Cigarettes    Start date: 06/08/1982   Smokeless tobacco: Never   Tobacco comments:    1 pack per day 03/04/2018  Vaping Use   Vaping Use:  Never used  Substance and Sexual Activity   Alcohol use: No   Drug use: No   Sexual activity: Yes    Birth control/protection: None  Other Topics Concern   Not on file  Social History Narrative   Disabled since age 50 lives with wife and children      Patient is right-handed. He is married, lives in a 1 story house, has a ramp to enter. Drinks 64oz of Mtn. Dew a day. Prior to CVA was walking daily. 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      Social Determinants of Health   Financial Resource Strain: Not on file  Food Insecurity: Not on file  Transportation Needs: Not on file  Physical Activity: Not on file  Stress: Not on file  Social Connections: Not on file   Past Surgical History:  Procedure Laterality Date   CHOLECYSTECTOMY     CORONARY ARTERY BYPASS GRAFT  2010   LIMA to LAD, SVG to diagonal, SVG to OM1 and OM 2, SVG to RCA   DENTAL SURGERY  2003   GASTRIC BYPASS  2010   HERNIA REPAIR  2011, 2012   [place] N/A 07/23/2013   Procedure: HERNIA REPAIR INCISIONAL WITH MESH;  Surgeon: Elta Guadeloupe  Lowella Petties, MD;  Location: [facility];  Service: General;  Laterality: N/A;   INSERTION OF MESH N/A 07/23/2013   Procedure: INSERTION OF MESH;  Surgeon: Jamesetta So, MD;  Location: [facility];  Service: General;  Laterality: N/A;   LEFT HEART CATHETERIZATION WITH CORONARY/GRAFT ANGIOGRAM N/A 11/01/2013   Procedure: LEFT HEART CATHETERIZATION WITH Beatrix Fetters;  Surgeon: Blane Ohara, MD;  Location: [facility] CATH LAB;  Service: Cardiovascular;  Laterality: N/A;   TOE AMPUTATION  1998   right 1st and 2nd toe   TONSILECTOMY, ADENOIDECTOMY, BILATERAL MYRINGOTOMY AND TUBES     TONSILLECTOMY     VENTRAL HERNIA REPAIR N/A 10/28/2012   Procedure: LAPAROSCOPIC VENTRAL HERNIA;  Surgeon: Donato Heinz, MD;  Location: [facility];  Service: General;  Laterality: N/A;   Past Surgical History:  Procedure Laterality Date   CHOLECYSTECTOMY     CORONARY ARTERY BYPASS GRAFT   2010   LIMA to LAD, SVG to diagonal, SVG to OM1 and OM 2, SVG to RCA   DENTAL SURGERY  2003   GASTRIC BYPASS  2010   HERNIA REPAIR  2011, 2012   [place] N/A 07/23/2013   Procedure: HERNIA REPAIR INCISIONAL WITH MESH;  Surgeon: Jamesetta So, MD;  Location: [facility];  Service: General;  Laterality: N/A;   INSERTION OF MESH N/A 07/23/2013   Procedure: INSERTION OF MESH;  Surgeon: Jamesetta So, MD;  Location: [facility];  Service: General;  Laterality: N/A;   LEFT HEART CATHETERIZATION WITH CORONARY/GRAFT ANGIOGRAM N/A 11/01/2013   Procedure: LEFT HEART CATHETERIZATION WITH Beatrix Fetters;  Surgeon: Blane Ohara, MD;  Location: [facility] CATH LAB;  Service: Cardiovascular;  Laterality: N/A;   TOE AMPUTATION  1998   right 1st and 2nd toe   TONSILECTOMY, ADENOIDECTOMY, BILATERAL MYRINGOTOMY AND TUBES     TONSILLECTOMY     VENTRAL HERNIA REPAIR N/A 10/28/2012   Procedure: LAPAROSCOPIC VENTRAL HERNIA;  Surgeon: Donato Heinz, MD;  Location: [facility];  Service: General;  Laterality: N/A;   Past Medical History:  Diagnosis Date   Anaphylaxis    IGE mediated   Anxiety    Arthritis    Cancer ([place])    Prostate   Cardiomyopathy ([place])    CHF (congestive heart failure) ([place])    COPD (chronic obstructive pulmonary disease) ([place])    Coronary atherosclerosis of native coronary artery    Multivessel status post CABG 2010   Depression    Essential hypertension    History of CVA (cerebrovascular accident) 01/2012   Right posterior frontal cortical and subcortical brain by MRI, no hemorrhage. Carotid Dopplers showed only 1-50% bilateral ICA stenoses. Echocardiogram showed LVEF 50%, no major valvular abnormalities.   Mixed hyperlipidemia    Myocardial infarction ([place]) 2010   OSA (obstructive sleep apnea)    Prostate cancer ([place]) 12/2020   Dr. Tresa Moore at [facility]   Stroke [facility])    Type 2 diabetes mellitus ([place])    BP 103/67   Pulse 79   Temp 98.5 F (36.9 C)   Ht 5\' 11"   (1.803 m)   Wt 239 lb (108.4 kg) Comment: w/c  SpO2 95%   BMI 33.33 kg/m   Opioid Risk Score:   Fall Risk Score:  `1  Depression screen PHQ 2/9  Depression screen [facility] 2/9 02/22/2021 10/10/2020 04/07/2020 11/06/2018 08/14/2018 05/19/2018 09/29/2017  Decreased Interest 1 1 0 0 1 1 1   Down, Depressed, Hopeless 1 1 0 0 1 1 3   PHQ -  2 Score 2 2 0 0 2 2 4   Altered sleeping - - - - - - 3  Tired, decreased energy - - - - - - 2  Change in appetite - - - - - - 3  Feeling bad or failure about yourself  - - - - - - 3  Trouble concentrating - - - - - - 3  Moving slowly or fidgety/restless - - - - - - 2  Suicidal thoughts - - - - - - 0  PHQ-9 Score - - - - - - 20  Difficult doing work/chores - - - - - - Somewhat difficult  Some encounter information is confidential and restricted. Go to Review Flowsheets activity to see all data.  Some recent data might be hidden    Review of Systems  Musculoskeletal:  Positive for gait problem.       Pain in left arm, finger tips on left hand , left hip pain & left leg  All other systems reviewed and are negative.     Objective:   Physical Exam Constitutional:      Appearance: He is obese.  HENT:     Head: Normocephalic and atraumatic.  Eyes:     Extraocular Movements: Extraocular movements intact.     Conjunctiva/sclera: Conjunctivae normal.     Pupils: Pupils are equal, round, and reactive to light.  Musculoskeletal:     Comments: 1 finger breath subluxation of shoulder  Skin:    General: Skin is warm and dry.  Neurological:     Mental Status: He is alert and oriented to person, place, and time.     Comments: Tone MAS 2 and left bicep MAS 1 at the finger flexors MAS 2 at the wrist flexors Motor strength is to minus deltoid bicep tricep trace finger flexors 0 finger extensors Left lower extremity 3 - hip flexors 4 - knee extensor 0 at the ankle Lower extremity tone no evidence of clonus at the ankle  Psychiatric:        Mood and Affect: Mood  normal.        Behavior: Behavior normal.          Assessment & Plan:  #1.  History of right MCA distribution infarct with chronic left spastic hemiplegia as well as left shoulder subluxation.  He is doing relatively well in terms of his tone is any combination of baclofen 20 mg twice daily as well as Dysport injections every 3 months 2.  Left shoulder subluxation difficulty maintaining standard sling.  We will make referral to OT for management, may benefit from give more sling

## 2021-02-23 ENCOUNTER — Other Ambulatory Visit: Payer: Self-pay | Admitting: Urology

## 2021-02-23 DIAGNOSIS — F1120 Opioid dependence, uncomplicated: Secondary | ICD-10-CM | POA: Diagnosis not present

## 2021-02-23 DIAGNOSIS — G894 Chronic pain syndrome: Secondary | ICD-10-CM | POA: Diagnosis not present

## 2021-02-23 DIAGNOSIS — I639 Cerebral infarction, unspecified: Secondary | ICD-10-CM | POA: Diagnosis not present

## 2021-02-23 DIAGNOSIS — F17200 Nicotine dependence, unspecified, uncomplicated: Secondary | ICD-10-CM | POA: Diagnosis not present

## 2021-02-23 DIAGNOSIS — F17210 Nicotine dependence, cigarettes, uncomplicated: Secondary | ICD-10-CM | POA: Diagnosis not present

## 2021-02-23 DIAGNOSIS — E1165 Type 2 diabetes mellitus with hyperglycemia: Secondary | ICD-10-CM | POA: Diagnosis not present

## 2021-02-27 ENCOUNTER — Other Ambulatory Visit: Payer: Self-pay

## 2021-02-27 ENCOUNTER — Encounter (HOSPITAL_COMMUNITY): Payer: Self-pay | Admitting: Occupational Therapy

## 2021-02-27 ENCOUNTER — Telehealth: Payer: Self-pay | Admitting: Cardiology

## 2021-02-27 ENCOUNTER — Ambulatory Visit (HOSPITAL_COMMUNITY): Payer: Medicare HMO | Attending: Physical Medicine & Rehabilitation | Admitting: Occupational Therapy

## 2021-02-27 DIAGNOSIS — M25512 Pain in left shoulder: Secondary | ICD-10-CM | POA: Diagnosis not present

## 2021-02-27 DIAGNOSIS — G8929 Other chronic pain: Secondary | ICD-10-CM

## 2021-02-27 DIAGNOSIS — R29898 Other symptoms and signs involving the musculoskeletal system: Secondary | ICD-10-CM

## 2021-02-27 NOTE — Telephone Encounter (Signed)
   [place] Pre-operative Risk Assessment    Patient Name: [name]  DOB: Jan 04, 1966 MRN: [id number]  HEARTCARE STAFF:  - IMPORTANT!!!!!! Under Visit Info/Reason for Call, type in Other and utilize the format Clearance MM/DD/YY or Clearance TBD. Do not use dashes or single digits. - Please review there is not already an duplicate clearance open for this procedure. - If request is for dental extraction, please clarify the # of teeth to be extracted. - If the patient is currently at the dentist's office, call Pre-Op Callback Staff (MA/nurse) to input urgent request.  - If the patient is not currently in the dentist office, please route to the Pre-Op pool.  Request for surgical clearance:  What type of surgery is being performed?  XI ROBOTIC ASSISTED LAPAROSCOPIC RADICAL PROSTATECTOMY WITH INDOCYANINE GREEN DYE BILATERAL PELVIC LYMPH NODE DISSECTION OPEN HERNIA REPAIR UMBILICAL ADULT  When is this surgery scheduled?  April 04, 2021  What type of clearance is required (medical clearance vs. Pharmacy clearance to hold med vs. Both)?  Both  Are there any medications that need to be held prior to surgery and how long?  Plavix 1 week prior to surgery   Practice name and name of physician performing surgery?  [facility]- Dr. Alexis Frock   What is the office phone number?  [phone] ext: [phone]   4.   What is the office fax number?         1.   [phone]  8.   Anesthesia type (None, local, MAC, general) ?         1.   Does not state     Desma Paganini 02/27/2021, 8:40 AM  _________________________________________________________________   (provider comments below)
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
## 2021-02-27 NOTE — Telephone Encounter (Signed)
   Name: [name]  DOB: 28-Nov-1965  MRN: [id number]  Primary Cardiologist: Rozann Lesches, MD  Chart reviewed as part of pre-operative protocol coverage. Patient is scheduled to see Dr. Domenic Polite 03/23/21 for 6 month follow-up. Would be reasonable to address preop risk at that time.   Pre-op covering staff: - "Preop " has been added to the appointment notes so provider is aware. - Please contact requesting surgeon's office via preferred method (i.e, phone, fax) to inform them of need for appointment prior to surgery.   Abigail Butts, PA-C  02/27/2021, 10:15 AM

## 2021-02-27 NOTE — Telephone Encounter (Signed)
Faxed to requesting surgeons office via the epic fax function to make them aware.
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
## 2021-03-02 ENCOUNTER — Other Ambulatory Visit: Payer: Self-pay

## 2021-03-02 ENCOUNTER — Telehealth (INDEPENDENT_AMBULATORY_CARE_PROVIDER_SITE_OTHER): Payer: Medicare HMO | Admitting: Psychiatry

## 2021-03-02 ENCOUNTER — Encounter: Payer: Self-pay | Admitting: Psychiatry

## 2021-03-02 DIAGNOSIS — F330 Major depressive disorder, recurrent, mild: Secondary | ICD-10-CM

## 2021-03-02 MED ORDER — DULOXETINE HCL 30 MG PO CPEP: ORAL_CAPSULE | ORAL | 0 refills | Status: DC

## 2021-03-02 MED ORDER — DULOXETINE HCL 60 MG PO CPEP: ORAL_CAPSULE | ORAL | 0 refills | Status: DC

## 2021-03-02 NOTE — Patient Instructions (Addendum)
1. Continue duloxetine 90 mg (60 mg + 30 mg) daily  2. Next appointment: 11/14 at 10 AM  The next visit will be in person visit. Please arrive 15 mins before the scheduled time.   [facility]  Address: [address], [place], [place] [address]

## 2021-03-06 ENCOUNTER — Telehealth: Payer: Self-pay | Admitting: Cardiology

## 2021-03-06 NOTE — Telephone Encounter (Signed)
Advised that this can be discussed at his visit on 03/23/21.  Verbalized understanding

## 2021-03-06 NOTE — Telephone Encounter (Signed)
Pt is wanting to know if he's going to need another Echo, because he's supposed to be having surgery and wants to make sure everything is in line so it doesn't get pushed back.   Please call [phone]

## 2021-03-07 ENCOUNTER — Ambulatory Visit (HOSPITAL_COMMUNITY): Payer: Medicare HMO | Admitting: Occupational Therapy

## 2021-03-07 ENCOUNTER — Encounter (HOSPITAL_COMMUNITY): Payer: Self-pay | Admitting: Occupational Therapy

## 2021-03-07 ENCOUNTER — Other Ambulatory Visit: Payer: Self-pay

## 2021-03-07 DIAGNOSIS — M25512 Pain in left shoulder: Secondary | ICD-10-CM

## 2021-03-07 DIAGNOSIS — R29898 Other symptoms and signs involving the musculoskeletal system: Secondary | ICD-10-CM

## 2021-03-07 DIAGNOSIS — G8929 Other chronic pain: Secondary | ICD-10-CM | POA: Diagnosis not present
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
## 2021-03-12 DIAGNOSIS — Z515 Encounter for palliative care: Secondary | ICD-10-CM | POA: Diagnosis not present

## 2021-03-12 DIAGNOSIS — C61 Malignant neoplasm of prostate: Secondary | ICD-10-CM | POA: Diagnosis not present

## 2021-03-14 DIAGNOSIS — E1122 Type 2 diabetes mellitus with diabetic chronic kidney disease: Secondary | ICD-10-CM | POA: Diagnosis not present

## 2021-03-14 DIAGNOSIS — I130 Hypertensive heart and chronic kidney disease with heart failure and stage 1 through stage 4 chronic kidney disease, or unspecified chronic kidney disease: Secondary | ICD-10-CM | POA: Diagnosis not present

## 2021-03-14 DIAGNOSIS — I5023 Acute on chronic systolic (congestive) heart failure: Secondary | ICD-10-CM | POA: Diagnosis not present

## 2021-03-14 DIAGNOSIS — N184 Chronic kidney disease, stage 4 (severe): Secondary | ICD-10-CM | POA: Diagnosis not present

## 2021-03-16 ENCOUNTER — Ambulatory Visit: Payer: Medicare HMO | Admitting: Physical Therapy

## 2021-03-20 ENCOUNTER — Ambulatory Visit: Payer: Medicare HMO | Admitting: Physical Medicine & Rehabilitation

## 2021-03-21 DIAGNOSIS — E559 Vitamin D deficiency, unspecified: Secondary | ICD-10-CM | POA: Diagnosis not present

## 2021-03-21 DIAGNOSIS — E1165 Type 2 diabetes mellitus with hyperglycemia: Secondary | ICD-10-CM | POA: Diagnosis not present

## 2021-03-21 DIAGNOSIS — F39 Unspecified mood [affective] disorder: Secondary | ICD-10-CM | POA: Diagnosis not present

## 2021-03-21 DIAGNOSIS — Z79899 Other long term (current) drug therapy: Secondary | ICD-10-CM | POA: Diagnosis not present

## 2021-03-21 DIAGNOSIS — R635 Abnormal weight gain: Secondary | ICD-10-CM | POA: Diagnosis not present

## 2021-03-21 DIAGNOSIS — E7800 Pure hypercholesterolemia, unspecified: Secondary | ICD-10-CM | POA: Diagnosis not present

## 2021-03-23 ENCOUNTER — Encounter: Payer: Self-pay | Admitting: Cardiology

## 2021-03-23 ENCOUNTER — Ambulatory Visit (INDEPENDENT_AMBULATORY_CARE_PROVIDER_SITE_OTHER): Payer: Medicare HMO | Admitting: Cardiology

## 2021-03-23 ENCOUNTER — Telehealth: Payer: Self-pay | Admitting: Cardiology

## 2021-03-23 VITALS — BP 110/62 | HR 77 | Ht 71.0 in | Wt 227.0 lb

## 2021-03-23 DIAGNOSIS — I255 Ischemic cardiomyopathy: Secondary | ICD-10-CM | POA: Diagnosis not present

## 2021-03-23 DIAGNOSIS — Z01810 Encounter for preprocedural cardiovascular examination: Secondary | ICD-10-CM

## 2021-03-23 DIAGNOSIS — I25119 Atherosclerotic heart disease of native coronary artery with unspecified angina pectoris: Secondary | ICD-10-CM | POA: Diagnosis not present

## 2021-03-23 DIAGNOSIS — E782 Mixed hyperlipidemia: Secondary | ICD-10-CM

## 2021-03-23 NOTE — Telephone Encounter (Signed)
Checking percert on the following patient for testing scheduled at [facility].     ECHO   03/27/2021

## 2021-03-23 NOTE — Progress Notes (Signed)
Cardiology Office Note  Date: 03/23/2021   ID: [name], DOB 05-07-1966, MRN [id number]  PCP:  Caryl Bis, MD  Cardiologist:  Rozann Lesches, MD Electrophysiologist:  None   Chief Complaint  Patient presents with   Cardiac follow-up    History of Present Illness: [name] is a 55 y.o. male last seen in March.  He is here for a routine visit.  He does not report any active angina symptoms or progressive shortness of breath with ADLs.  He states that he has been taking his medications regularly.  He is scheduled for for a robot assisted laparoscopic radical prostatectomy with pelvic lymph node dissection and open umbilical hernia repair on September 21 under general anesthesia by Dr. Tresa Moore. RCRI perioperative cardiac risk calculator indicates class IV, 11% chance of major adverse cardiac event (high risk).  This is not a modifiable risk and predominantly based on surgery type and his medical comorbidities.  I reviewed his cardiac regimen which is stable.  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  His last echocardiogram was in November 2021 at which point LVEF was approximately 45% with wall motion normalities consistent with ischemic cardiomyopathy.  Past Medical History:  Diagnosis Date   Anaphylaxis    IGE mediated   Anxiety    Arthritis    Cancer ([name])    Prostate   Cardiomyopathy ([place])    CHF (congestive heart failure) (HCC)    COPD (chronic obstructive pulmonary disease) ([place])    Coronary atherosclerosis of native coronary artery    Multivessel status post CABG 2010   Depression    Essential hypertension    History of CVA (cerebrovascular accident) 01/2012   Right posterior frontal cortical and subcortical brain by MRI, no hemorrhage. Carotid Dopplers showed only 1-50% bilateral ICA stenoses. Echocardiogram showed LVEF 50%, no major valvular abnormalities.   Mixed hyperlipidemia     Myocardial infarction ([place]) 2010   OSA (obstructive sleep apnea)    Prostate cancer ([name]) 12/2020   Dr. Tresa Moore at [facility]   Stroke [facility])    Type 2 diabetes mellitus [facility])     Past Surgical History:  Procedure Laterality Date   CHOLECYSTECTOMY     CORONARY ARTERY BYPASS GRAFT  2010   LIMA to LAD, SVG to diagonal, SVG to OM1 and OM 2, SVG to RCA   DENTAL SURGERY  2003   GASTRIC BYPASS  2010   HERNIA REPAIR  2011, 2012   [place] N/A 07/23/2013   Procedure: HERNIA REPAIR INCISIONAL WITH MESH;  Surgeon: Jamesetta So, MD;  Location: [facility];  Service: General;  Laterality: N/A;   INSERTION OF MESH N/A 07/23/2013   Procedure: INSERTION OF MESH;  Surgeon: Jamesetta So, MD;  Location: [facility];  Service: General;  Laterality: N/A;   LEFT HEART CATHETERIZATION WITH CORONARY/GRAFT ANGIOGRAM N/A 11/01/2013   Procedure: LEFT HEART CATHETERIZATION WITH Beatrix Fetters;  Surgeon: Blane Ohara, MD;  Location: [facility] CATH LAB;  Service: Cardiovascular;  Laterality: N/A;   TOE AMPUTATION  1998   right 1st and 2nd toe   TONSILECTOMY, ADENOIDECTOMY, BILATERAL MYRINGOTOMY AND TUBES     TONSILLECTOMY     VENTRAL HERNIA REPAIR N/A 10/28/2012   Procedure: LAPAROSCOPIC VENTRAL HERNIA;  Surgeon: Donato Heinz, MD;  Location: [facility];  Service: General;  Laterality: N/A;    Current Outpatient Medications  Medication Sig Dispense Refill   albuterol (PROVENTIL HFA;VENTOLIN  HFA) 108 (90 BASE) MCG/ACT inhaler Inhale 2 puffs into the lungs every 6 (six) hours as needed for wheezing or shortness of breath.     ALPRAZolam (XANAX) 1 MG tablet 0.5-1 mg daily as needed for anxiety 30 tablet 2   aspirin EC 81 MG tablet Take 1 tablet (81 mg total) by mouth daily. 90 tablet 3   atorvastatin (LIPITOR) 40 MG tablet Take 1 tablet (40 mg total) by mouth daily. 30 tablet 6   baclofen (LIORESAL) 10 MG tablet      baclofen (LIORESAL) 20 MG tablet Take 1 tablet (20 mg total) by mouth 2 (two) times  daily. 60 tablet 5   carvedilol (COREG) 6.25 MG tablet Take 6.25 mg by mouth 2 (two) times daily with a meal.     Cholecalciferol (VITAMIN D) 2000 units tablet Take 2,000 Units by mouth daily at 12 noon.     clopidogrel (PLAVIX) 75 MG tablet Take 1 tablet by mouth daily.     dapsone 25 MG tablet      dapsone 25 MG tablet Take by mouth.     diltiazem (CARDIZEM CD) 240 MG 24 hr capsule Take 240 mg by mouth daily.      DULoxetine (CYMBALTA) 30 MG capsule Take 90 mg daily (60 mg + 30 mg) 90 capsule 0   DULoxetine (CYMBALTA) 60 MG capsule Take 90 mg daily (60 mg + 30 mg) 90 capsule 0   ENTRESTO 49-51 MG TAKE (1) TABLET TWICE DAILY. 60 tablet 3   fenofibrate micronized (LOFIBRA) 134 MG capsule Take 134 mg by mouth daily before breakfast.      fluticasone (CUTIVATE) 0.05 % cream Apply 1 application topically 2 (two) times daily.     fluticasone (FLONASE) 50 MCG/ACT nasal spray      furosemide (LASIX) 20 MG tablet Take 20 mg by mouth daily.      gabapentin (NEURONTIN) 600 MG tablet      isosorbide mononitrate (IMDUR) 60 MG 24 hr tablet Take 60 mg by mouth 2 (two) times daily.      levofloxacin (LEVAQUIN) 750 MG tablet Take 1 tablet (750 mg total) by mouth daily. 1 tablet 0   naloxone (NARCAN) nasal spray 4 mg/0.1 mL      nitroGLYCERIN (NITROSTAT) 0.4 MG SL tablet Place 0.4 mg under the tongue every 5 (five) minutes as needed. For chest pain     oxyCODONE (OXY IR/ROXICODONE) 5 MG immediate release tablet Take 5 mg by mouth every 6 (six) hours as needed for severe pain.      oxyCODONE-acetaminophen (PERCOCET) 7.5-325 MG tablet      OZEMPIC, 0.25 OR 0.5 MG/DOSE, 2 MG/1.5ML SOPN      OZEMPIC, 1 MG/DOSE, 4 MG/3ML SOPN      pregabalin (LYRICA) 75 MG capsule Take 1 capsule (75 mg total) by mouth 2 (two) times daily. 60 capsule 5   Semaglutide, 1 MG/DOSE, 2 MG/1.5ML SOPN Inject into the skin once a week.     silver sulfADIAZINE (SILVADENE) 1 % cream      tamsulosin (FLOMAX) 0.4 MG CAPS capsule Take 1  capsule (0.4 mg total) by mouth daily after supper. 30 capsule 11   tiZANidine (ZANAFLEX) 2 MG tablet Take 2 mg by mouth once.      traZODone (DESYREL) 50 MG tablet Take 50 mg by mouth at bedtime as needed for sleep.     triamcinolone cream (KENALOG) 0.1 % Apply 1 application topically 2 (two) times daily.  varenicline (CHANTIX) 1 MG tablet      No current facility-administered medications for this visit.   Allergies:  Contrast media [iodinated diagnostic agents], Ibuprofen, Nsaids, and Other   ROS: No palpitations or syncope.  Physical Exam: VS:  BP 110/62   Pulse 77   Ht 5\' 11"  (1.803 m)   Wt 227 lb (103 kg)   SpO2 95%   BMI 31.66 kg/m , BMI Body mass index is 31.66 kg/m.  Wt Readings from Last 3 Encounters:  03/23/21 227 lb (103 kg)  02/22/21 239 lb (108.4 kg)  01/11/21 226 lb (102.5 kg)    General: Patient appears comfortable at rest.  Seated in wheelchair. HEENT: Conjunctiva and lids normal, wearing a mask. Neck: Supple, no elevated JVP or carotid bruits, no thyromegaly. Lungs: Clear to auscultation, nonlabored breathing at rest. Cardiac: Regular rate and rhythm, no S3, 1/6 systolic murmur. Extremities: Left leg brace. Neuropsychiatric: Alert and oriented x3.  Chronic left-sided hemiparesis.  ECG:  An ECG dated 10/03/2020 was personally reviewed today and demonstrated:  Sinus rhythm with old anterior infarct pattern.  Recent Labwork:  February 2022: Hgb A1c 5.6%, PSA 19.8, BUN 20, creatinine 1.55, potassium 3.8, AST 15, ALT 23, cholesterol 128, TG 142, HDL 30, LDL 73  Other Studies Reviewed Today:  Echocardiogram 05/25/2020:  1. Very limited images. Suggest Definity contrast with next study.   2. Left ventricular ejection fraction, by estimation, is approximately  45%. The left ventricle has mildly decreased function. Left ventricular  endocardial border not optimally defined to evaluate regional wall motion,  but there anteroseptal hypokinesis  to akinesis  noted. There is mild left ventricular hypertrophy. Left  ventricular diastolic parameters were normal.   3. Right ventricular systolic function is normal. The right ventricular  size is normal.   4. The mitral valve is grossly normal. Trivial mitral valve  regurgitation.   5. The aortic valve is tricuspid. Aortic valve regurgitation is trivial.   6. Unable to estimate CVP.   Assessment and Plan:  1.  Preoperative cardiac evaluation in a 55 year old male with history of ischemic cardiomyopathy and LVEF 45%, multivessel CAD status post CABG with evidence of large anterior infarct scar by Myoview in 2018, hyperlipidemia, type 2 diabetes mellitus, stroke, OSA, and hypertension.  He is scheduled for a robot assisted laparoscopic radical prostatectomy with pelvic lymph node dissection and open umbilical hernia repair on September 21 under general anesthesia by Dr. Tresa Moore. RCRI perioperative cardiac risk calculator indicates class IV, 11% chance of major adverse cardiac event (high risk).  This is not a modifiable risk and predominantly based on surgery type and his medical comorbidities.  We will obtain a follow-up echocardiogram to reassess LVEF, no clear indication for ischemic testing at this time in the absence of any significant angina or recent heart failure symptoms.  Would hold aspirin at least 7 days and Plavix at least 5 days prior to surgery.  2.  Ischemic cardiomyopathy with LVEF approximately 45% by last assessment.  Volume status has been stable.  No recent heart failure decompensation.  Current regimen includes Entresto, Coreg, Imdur, and Lasix.  Not on Aldactone with CKD stage IIIb.  We will obtain a follow-up echocardiogram for reassessment.  3.  Multivessel CAD status post CABG and evidence of subsequent anterior infarct with graft disease.  He is symptomatically stable without active angina on medical therapy.  In addition to the above medications he is also on aspirin, Plavix, and  Lipitor.  4.  CKD stage  IIIb, creatinine 1.55.  5.  Mixed hyperlipidemia, LDL 73.  Medication Adjustments/Labs and Tests Ordered: Current medicines are reviewed at length with the patient today.  Concerns regarding medicines are outlined above.   Tests Ordered: Orders Placed This Encounter  Procedures   ECHOCARDIOGRAM COMPLETE     Medication Changes: No orders of the defined types were placed in this encounter.   Disposition:  Follow up  6 months.  Signed, Satira Sark, MD, [facility] 03/23/2021 2:52 PM    [facility] at [address], [place], [place] [address] Phone: [phone]; Fax: [phone]

## 2021-03-23 NOTE — Patient Instructions (Signed)
Medication Instructions:  Your physician recommends that you continue on your current medications as directed. Please refer to the Current Medication list given to you today.  *If you need a refill on your cardiac medications before your next appointment, please call your pharmacy*   Lab Work: None If you have labs (blood work) drawn today and your tests are completely normal, you will receive your results only by: MyChart Message (if you have MyChart) OR A paper copy in the mail If you have any lab test that is abnormal or we need to change your treatment, we will call you to review the results.   Testing/Procedures: Your physician has requested that you have an echocardiogram. Echocardiography is a painless test that uses sound waves to create images of your heart. It provides your doctor with information about the size and shape of your heart and how well your heart's chambers and valves are working. This procedure takes approximately one hour. There are no restrictions for this procedure.    Follow-Up: At [facility], you and your health needs are our priority.  As part of our continuing mission to provide you with exceptional heart care, we have created designated Provider Care Teams.  These Care Teams include your primary Cardiologist (physician) and Advanced Practice Providers (APPs -  Physician Assistants and Nurse Practitioners) who all work together to provide you with the care you need, when you need it.  We recommend signing up for the patient portal called "MyChart".  Sign up information is provided on this After Visit Summary.  MyChart is used to connect with patients for Virtual Visits (Telemedicine).  Patients are able to view lab/test results, encounter notes, upcoming appointments, etc.  Non-urgent messages can be sent to your provider as well.   To learn more about what you can do with MyChart, go to [email or web address].    Your next appointment:   6  month(s)  The format for your next appointment:   In Person  Provider:   Samuel McDowell, MD   Other Instructions    

## 2021-03-26 DIAGNOSIS — I639 Cerebral infarction, unspecified: Secondary | ICD-10-CM | POA: Diagnosis not present

## 2021-03-26 DIAGNOSIS — E1165 Type 2 diabetes mellitus with hyperglycemia: Secondary | ICD-10-CM | POA: Diagnosis not present

## 2021-03-26 DIAGNOSIS — F17200 Nicotine dependence, unspecified, uncomplicated: Secondary | ICD-10-CM | POA: Diagnosis not present

## 2021-03-26 DIAGNOSIS — Z6831 Body mass index (BMI) 31.0-31.9, adult: Secondary | ICD-10-CM | POA: Diagnosis not present

## 2021-03-26 DIAGNOSIS — G894 Chronic pain syndrome: Secondary | ICD-10-CM | POA: Diagnosis not present

## 2021-03-26 DIAGNOSIS — F17210 Nicotine dependence, cigarettes, uncomplicated: Secondary | ICD-10-CM | POA: Diagnosis not present

## 2021-03-26 DIAGNOSIS — F1120 Opioid dependence, uncomplicated: Secondary | ICD-10-CM | POA: Diagnosis not present

## 2021-03-27 ENCOUNTER — Other Ambulatory Visit: Payer: Self-pay

## 2021-03-27 ENCOUNTER — Ambulatory Visit (HOSPITAL_COMMUNITY)
Admission: RE | Admit: 2021-03-27 | Discharge: 2021-03-27 | Disposition: A | Discharge status: Home / Self Care | Payer: Medicare HMO | Source: Ambulatory Visit | Attending: Cardiology | Admitting: Cardiology

## 2021-03-27 DIAGNOSIS — I255 Ischemic cardiomyopathy: Secondary | ICD-10-CM | POA: Insufficient documentation

## 2021-03-27 DIAGNOSIS — C61 Malignant neoplasm of prostate: Secondary | ICD-10-CM | POA: Diagnosis not present

## 2021-03-27 LAB — ECHOCARDIOGRAM COMPLETE
Area-P 1/2: 3.37 cm2
S' Lateral: 4 cm

## 2021-03-27 MED ORDER — PERFLUTREN LIPID MICROSPHERE
1.0000 mL | INTRAVENOUS | Status: AC | PRN
  Administered 2021-03-27: 2 mL via INTRAVENOUS

## 2021-03-27 NOTE — Progress Notes (Signed)
*  PRELIMINARY RESULTS* Echocardiogram 2D Echocardiogram has been performed with Definity.  Samuel [place] 03/27/2021, 12:34 PM

## 2021-03-29 ENCOUNTER — Telehealth: Payer: Self-pay | Admitting: *Deleted

## 2021-03-29 NOTE — Telephone Encounter (Signed)
-----   Message from Satira Sark, MD sent at 03/27/2021 11:53 AM EDT ----- Results reviewed.  LVEF stable, 45 to 50% range and better evaluated by the present echocardiogram.  This does not alter our recent preoperative assessment and recommendations as per office note.

## 2021-03-29 NOTE — Telephone Encounter (Signed)
Patient informed. Copy sent to PCP °

## 2021-03-29 NOTE — Patient Instructions (Addendum)
DUE TO COVID-19 ONLY ONE VISITOR IS ALLOWED TO COME WITH YOU AND STAY IN THE WAITING ROOM ONLY DURING PRE OP AND PROCEDURE.   **NO VISITORS ARE ALLOWED IN THE SHORT STAY AREA OR RECOVERY ROOM!!**  IF YOU WILL BE ADMITTED INTO THE HOSPITAL YOU ARE ALLOWED ONLY TWO SUPPORT PEOPLE DURING VISITATION HOURS ONLY (10AM -8PM)   The support person(s) may change daily. The support person(s) must pass our screening, gel in and out, and wear a mask at all times, including in the patient's room. Patients must also wear a mask when staff or their support person are in the room.  No visitors under the age of 29. Any visitor under the age of 12 must be accompanied by an adult.        Your procedure is scheduled on: 04/04/21   Report to [facility] Main  Entrance    Report to admitting at 8:45 AM   Call this number if you have problems the morning of surgery [phone]   Follow clear liquid diet day before surgery   May have liquids until 9:00 AM day of surgery  CLEAR LIQUID DIET  Foods Allowed                                                                     Foods Excluded  Water, Black Coffee and tea (no milk or creamer)           liquids that you cannot  Plain Jell-O in any flavor  (No red)                                    see through such as: Fruit ices (not with fruit pulp)                                            milk, soups, orange juice              Iced Popsicles (No red)                                               All solid food                                   Apple juices Sports drinks like Gatorade (No red) Lightly seasoned clear broth or consume(fat free) Sugar   Oral Hygiene is also important to reduce your risk of infection.                                    Remember - BRUSH YOUR TEETH THE MORNING OF SURGERY WITH YOUR REGULAR TOOTHPASTE   Do NOT smoke after Midnight   Take these medicines the morning of surgery with A SIP OF WATER: Tylenol, Inhalers, Xanax,  Lipitor, Carvedilol,  Diltiazem, Cymbalta, IMDUR, Percocet, Lyrica  DO NOT TAKE ANY ORAL DIABETIC MEDICATIONS DAY OF YOUR SURGERY How to Manage Your Diabetes Before and After Surgery  Why is it important to control my blood sugar before and after surgery? Improving blood sugar levels before and after surgery helps healing and can limit problems. A way of improving blood sugar control is eating a healthy diet by:  Eating less sugar and carbohydrates  Increasing activity/exercise  Talking with your doctor about reaching your blood sugar goals High blood sugars (greater than 180 mg/dL) can raise your risk of infections and slow your recovery, so you will need to focus on controlling your diabetes during the weeks before surgery. Make sure that the doctor who takes care of your diabetes knows about your planned surgery including the date and location.  How do I manage my blood sugar before surgery? Check your blood sugar at least 4 times a day, starting 2 days before surgery, to make sure that the level is not too high or low. Check your blood sugar the morning of your surgery when you wake up and every 2 hours until you get to the Short Stay unit. If your blood sugar is less than 70 mg/dL, you will need to treat for low blood sugar: Do not take insulin. Treat a low blood sugar (less than 70 mg/dL) with  cup of clear juice (cranberry or apple), 4 glucose tablets, OR glucose gel. Recheck blood sugar in 15 minutes after treatment (to make sure it is greater than 70 mg/dL). If your blood sugar is not greater than 70 mg/dL on recheck, call [phone] for further instructions. Report your blood sugar to the short stay nurse when you get to Short Stay.  If you are admitted to the hospital after surgery: Your blood sugar will be checked by the staff and you will probably be given insulin after surgery (instead of oral diabetes medicines) to make sure you have good blood sugar levels. The goal for  blood sugar control after surgery is 80-180 mg/dL.                              You may not have any metal on your body including jewelry, and body piercing             Do not wear lotions, powders, cologne, or deodorant              Men may shave face and neck.   Do not bring valuables to the hospital. [place].   Contacts, dentures or bridgework may not be worn into surgery.   Bring small overnight bag day of surgery.   Follow instructions given to you regarding Plavix and Aspirin before surgery.   Please read over the following fact sheets you were given: IF YOU HAVE QUESTIONS ABOUT YOUR PRE OP INSTRUCTIONS PLEASE CALL [phone]- [facility] - Preparing for Surgery Before surgery, you can play an important role.  Because skin is not sterile, your skin needs to be as free of germs as possible.  You can reduce the number of germs on your skin by washing with CHG (chlorahexidine gluconate) soap before surgery.  CHG is an antiseptic cleaner which kills germs and bonds with the skin to continue killing germs even after washing. Please DO NOT use  if you have an allergy to CHG or antibacterial soaps.  If your skin becomes reddened/irritated stop using the CHG and inform your nurse when you arrive at Short Stay. Do not shave (including legs and underarms) for at least 48 hours prior to the first CHG shower.  You may shave your face/neck.  Please follow these instructions carefully:  1.  Shower with CHG Soap the night before surgery and the  morning of surgery.  2.  If you choose to wash your hair, wash your hair first as usual with your normal  shampoo.  3.  After you shampoo, rinse your hair and body thoroughly to remove the shampoo.                             4.  Use CHG as you would any other liquid soap.  You can apply chg directly to the skin and wash.  Gently with a scrungie or clean washcloth.  5.  Apply the CHG Soap to  your body ONLY FROM THE NECK DOWN.   Do   not use on face/ open                           Wound or open sores. Avoid contact with eyes, ears mouth and   genitals (private parts).                       Wash face,  Genitals (private parts) with your normal soap.             6.  Wash thoroughly, paying special attention to the area where your    surgery  will be performed.  7.  Thoroughly rinse your body with warm water from the neck down.  8.  DO NOT shower/wash with your normal soap after using and rinsing off the CHG Soap.                9.  Pat yourself dry with a clean towel.            10.  Wear clean pajamas.            11.  Place clean sheets on your bed the night of your first shower and do not  sleep with pets. Day of Surgery : Do not apply any lotions/deodorants the morning of surgery.  Please wear clean clothes to the hospital/surgery center.  FAILURE TO FOLLOW THESE INSTRUCTIONS MAY RESULT IN THE CANCELLATION OF YOUR SURGERY  PATIENT SIGNATURE_________________________________  NURSE SIGNATURE__________________________________  ________________________________________________________________________

## 2021-03-29 NOTE — Progress Notes (Addendum)
COVID swab appointment: 04/02/21  COVID Vaccine Completed: yes x2 Date COVID Vaccine completed: Has received booster: COVID vaccine manufacturer: Moderna     Date of COVID positive in last 90 days: No  PCP - Gar Ponto, MD Cardiologist - Rozann Lesches, MD  Cardiac clearance 03/23/21 by Rozann Lesches, epic  Chest x-ray - N/s EKG - 10/03/20 Epic Stress Test - 2014 ECHO - 03/27/21 Epic Cardiac Cath - yes per pt 2009 Pacemaker/ICD device last checked:N/a Spinal Cord Stimulator: N/a  Sleep Study - positive CPAP - doesn't use  Fasting Blood Sugar - 87-101 Checks Blood Sugar _1_ times a day  Blood Thinner Instructions: Plavix hold 5 days Aspirin Instructions: ASA 81, hold 7 days Last Dose: ASA 03/25/21, Plavix 03/29/21  Activity level: Can perform activities of daily living without stopping and without symptoms of chest pain or shortness of breath. Left sided weakness from stoke is able to go up about 4 steps with assistance.   Anesthesia review: HTN, coronary atherosclerosis, cardiomyopathy, NSTEMI, COPD, OSA, stroke with left sided hemiparesis, CHF, DM  Patient denies shortness of breath, fever, and chest pain at PAT appointment. Has chronic smoker cough   Patient verbalized understanding of instructions that were given to them at the PAT appointment. Patient was also instructed that they will need to review over the PAT instructions again at home before surgery.

## 2021-03-30 ENCOUNTER — Encounter (HOSPITAL_COMMUNITY): Payer: Self-pay

## 2021-03-30 ENCOUNTER — Other Ambulatory Visit: Payer: Self-pay

## 2021-03-30 ENCOUNTER — Encounter (HOSPITAL_COMMUNITY)
Admission: RE | Admit: 2021-03-30 | Discharge: 2021-03-30 | Disposition: A | Discharge status: Home / Self Care | Payer: Medicare HMO | Source: Ambulatory Visit | Attending: Urology | Admitting: Urology

## 2021-03-30 DIAGNOSIS — I509 Heart failure, unspecified: Secondary | ICD-10-CM | POA: Diagnosis not present

## 2021-03-30 DIAGNOSIS — Z01812 Encounter for preprocedural laboratory examination: Secondary | ICD-10-CM | POA: Diagnosis not present

## 2021-03-30 DIAGNOSIS — Z7902 Long term (current) use of antithrombotics/antiplatelets: Secondary | ICD-10-CM | POA: Diagnosis not present

## 2021-03-30 DIAGNOSIS — Z8673 Personal history of transient ischemic attack (TIA), and cerebral infarction without residual deficits: Secondary | ICD-10-CM | POA: Insufficient documentation

## 2021-03-30 DIAGNOSIS — G4733 Obstructive sleep apnea (adult) (pediatric): Secondary | ICD-10-CM | POA: Insufficient documentation

## 2021-03-30 DIAGNOSIS — Z7982 Long term (current) use of aspirin: Secondary | ICD-10-CM | POA: Diagnosis not present

## 2021-03-30 DIAGNOSIS — C61 Malignant neoplasm of prostate: Secondary | ICD-10-CM | POA: Diagnosis not present

## 2021-03-30 DIAGNOSIS — J449 Chronic obstructive pulmonary disease, unspecified: Secondary | ICD-10-CM | POA: Insufficient documentation

## 2021-03-30 DIAGNOSIS — Z79899 Other long term (current) drug therapy: Secondary | ICD-10-CM | POA: Insufficient documentation

## 2021-03-30 DIAGNOSIS — K429 Umbilical hernia without obstruction or gangrene: Secondary | ICD-10-CM | POA: Diagnosis not present

## 2021-03-30 DIAGNOSIS — E119 Type 2 diabetes mellitus without complications: Secondary | ICD-10-CM | POA: Insufficient documentation

## 2021-03-30 HISTORY — DX: Pneumonia, unspecified organism: J18.9

## 2021-03-30 HISTORY — DX: Personal history of urinary calculi: Z87.442

## 2021-03-30 LAB — BASIC METABOLIC PANEL
Anion gap: 6 (ref 5–15)
BUN: 17 mg/dL (ref 6–20)
CO2: 29 mmol/L (ref 22–32)
Calcium: 9.4 mg/dL (ref 8.9–10.3)
Chloride: 103 mmol/L (ref 98–111)
Creatinine, Ser: 0.98 mg/dL (ref 0.61–1.24)
GFR, Estimated: >60 mL/min (ref >60)
Glucose, Bld: 83 mg/dL (ref 70–99)
Potassium: 3.8 mmol/L (ref 3.5–5.1)
Sodium: 138 mmol/L (ref 135–145)

## 2021-03-30 LAB — CBC
HCT: 45.3 % (ref 39.0–52.0)
Hemoglobin: 14.7 g/dL (ref 13.0–17.0)
MCH: 29.5 pg (ref 26.0–34.0)
MCHC: 32.5 g/dL (ref 30.0–36.0)
MCV: 90.8 fL (ref 80.0–100.0)
Platelets: 181 10*3/uL (ref 150–400)
RBC: 4.99 MIL/uL (ref 4.22–5.81)
RDW: 14.3 % (ref 11.5–15.5)
WBC: 9.5 10*3/uL (ref 4.0–10.5)
nRBC: 0 % (ref 0.0–0.2)

## 2021-03-30 LAB — HEMOGLOBIN A1C
Hgb A1c MFr Bld: 4.9 % (ref 4.8–5.6)
Mean Plasma Glucose: 93.93 mg/dL

## 2021-03-30 LAB — GLUCOSE, CAPILLARY: Glucose-Capillary: 89 mg/dL (ref 70–99)

## 2021-03-30 NOTE — Progress Notes (Signed)
Anesthesia Chart Review   Case: [id number] Date/Time: 04/04/21 1145   Procedures:      XI ROBOTIC ASSISTED LAPAROSCOPIC RADICAL PROSTATECTOMY WITH INDOCYANINE GREEN DYE - 3 HRS     PELVIC LYMPH NODE DISSECTION (Bilateral)     OPEN HERNIA REPAIR UMBILICAL ADULT   Anesthesia type: General   Pre-op diagnosis: PROSTATE CANCER, UMBILICAL HERNIA   Location: [facility]OM 03 / [facility]   Surgeons: Alexis Frock, MD       DISCUSSION:55 y.o. every day smoker with h/o COPD, OSA, DM II, CHF, CAD, CVA, prostate cancer, umbilical hernia scheduled for above procedure 04/04/21 with Dr. Alexis Frock.   Pt seen by cardiology 03/23/2021. Per OV note, "Preoperative cardiac evaluation in a 55 year old male with history of ischemic cardiomyopathy and LVEF 45%, multivessel CAD status post CABG with evidence of large anterior infarct scar by Myoview in 2018, hyperlipidemia, type 2 diabetes mellitus, stroke, OSA, and hypertension.  He is scheduled for a robot assisted laparoscopic radical prostatectomy with pelvic lymph node dissection and open umbilical hernia repair on September 21 under general anesthesia by Dr. Tresa Moore. RCRI perioperative cardiac risk calculator indicates class IV, 11% chance of major adverse cardiac event (high risk).  This is not a modifiable risk and predominantly based on surgery type and his medical comorbidities.  We will obtain a follow-up echocardiogram to reassess LVEF, no clear indication for ischemic testing at this time in the absence of any significant angina or recent heart failure symptoms.  Would hold aspirin at least 7 days and Plavix at least 5 days prior to surgery."  Echo 03/27/2021, per Dr. Domenic Polite, "Results reviewed.  LVEF stable, 45 to 50% range and better evaluated by the present echocardiogram.  This does not alter our recent preoperative assessment and recommendations as per office note."  Anticipate pt can proceed with planned procedure barring acute status change.   VS: BP (!)  104/56   Pulse 98   Temp 36.9 C (Oral)   Resp 18   Ht 5\' 11"  (1.803 m)   Wt 103 kg   SpO2 93%   BMI 31.66 kg/m   PROVIDERS: Caryl Bis, MD is PCP   Rozann Lesches, MD is Cardiologist  LABS: Labs reviewed: Acceptable for surgery. (all labs ordered are listed, but only abnormal results are displayed)  Labs Reviewed  HEMOGLOBIN L2G  BASIC METABOLIC PANEL  CBC  GLUCOSE, CAPILLARY     IMAGES:   EKG: 10/03/20 Rate 78 bpm  NSR Anterior infarct, age undetermined T wave abnormality, consider lateral ischemia   CV: Echo 03/27/2021  1. Left ventricular ejection fraction, by estimation, is 45 to 50%. The  left ventricle has mildly decreased function. The left ventricle  demonstrates regional wall motion abnormalities (see scoring  diagram/findings for description). There is mild left  ventricular hypertrophy. Left ventricular diastolic parameters are  consistent with Grade II diastolic dysfunction (pseudonormalization). No  obvious LV mural thrombus.   2. Right ventricular systolic function is normal. The right ventricular  size is normal. Tricuspid regurgitation signal is inadequate for assessing  PA pressure.   3. The mitral valve is grossly normal. Trivial mitral valve  regurgitation.   4. The aortic valve is tricuspid. Aortic valve regurgitation is mild.  Mild aortic valve sclerosis is present, with no evidence of aortic valve  stenosis.   5. The inferior vena cava is normal in size with greater than 50%  respiratory variability, suggesting right atrial pressure of 3 mmHg.  Past Medical History:  Diagnosis Date   Anaphylaxis    IGE mediated   Anxiety    Arthritis    Cancer ([place])    Prostate   Cardiomyopathy ([place])    CHF (congestive heart failure) (HCC)    COPD (chronic obstructive pulmonary disease) ([place])    Coronary atherosclerosis of native coronary artery    Multivessel status post CABG 2010   Depression    Essential hypertension    History of CVA  (cerebrovascular accident) 01/2012   Right posterior frontal cortical and subcortical brain by MRI, no hemorrhage. Carotid Dopplers showed only 1-50% bilateral ICA stenoses. Echocardiogram showed LVEF 50%, no major valvular abnormalities.   History of kidney stones    Mixed hyperlipidemia    Myocardial infarction ([place]) 2010   OSA (obstructive sleep apnea)    Pneumonia    Prostate cancer ([place]) 12/2020   Dr. Tresa Moore at [facility]   Stroke [facility])    Type 2 diabetes mellitus [facility])     Past Surgical History:  Procedure Laterality Date   CHOLECYSTECTOMY     CORONARY ARTERY BYPASS GRAFT  2010   LIMA to LAD, SVG to diagonal, SVG to OM1 and OM 2, SVG to RCA   DENTAL SURGERY  2003   GASTRIC BYPASS  2010   HERNIA REPAIR  2011, 2012   [place] N/A 07/23/2013   Procedure: HERNIA REPAIR INCISIONAL WITH MESH;  Surgeon: Jamesetta So, MD;  Location: [facility];  Service: General;  Laterality: N/A;   INSERTION OF MESH N/A 07/23/2013   Procedure: INSERTION OF MESH;  Surgeon: Jamesetta So, MD;  Location: [facility];  Service: General;  Laterality: N/A;   LEFT HEART CATHETERIZATION WITH CORONARY/GRAFT ANGIOGRAM N/A 11/01/2013   Procedure: LEFT HEART CATHETERIZATION WITH Beatrix Fetters;  Surgeon: Blane Ohara, MD;  Location: [facility] CATH LAB;  Service: Cardiovascular;  Laterality: N/A;   TOE AMPUTATION  1998   right 1st and 2nd toe   TONSILECTOMY, ADENOIDECTOMY, BILATERAL MYRINGOTOMY AND TUBES     TONSILLECTOMY     VENTRAL HERNIA REPAIR N/A 10/28/2012   Procedure: LAPAROSCOPIC VENTRAL HERNIA;  Surgeon: Donato Heinz, MD;  Location: [facility];  Service: General;  Laterality: N/A;    MEDICATIONS:  acetaminophen (TYLENOL) 500 MG tablet   albuterol (PROVENTIL HFA;VENTOLIN HFA) 108 (90 BASE) MCG/ACT inhaler   ALPRAZolam (XANAX) 1 MG tablet   aspirin EC 81 MG tablet   atorvastatin (LIPITOR) 40 MG tablet   baclofen (LIORESAL) 20 MG tablet   carvedilol (COREG) 6.25 MG tablet    Cholecalciferol (VITAMIN D) 2000 units tablet   clopidogrel (PLAVIX) 75 MG tablet   dapsone 25 MG tablet   diltiazem (CARDIZEM CD) 240 MG 24 hr capsule   DULoxetine (CYMBALTA) 30 MG capsule   DULoxetine (CYMBALTA) 60 MG capsule   ENTRESTO 49-51 MG   fluticasone (CUTIVATE) 0.05 % cream   fluticasone (FLONASE) 50 MCG/ACT nasal spray   furosemide (LASIX) 20 MG tablet   isosorbide mononitrate (IMDUR) 60 MG 24 hr tablet   naloxone (NARCAN) nasal spray 4 mg/0.1 mL   nitroGLYCERIN (NITROSTAT) 0.4 MG SL tablet   oxyCODONE-acetaminophen (PERCOCET) 7.5-325 MG tablet   OZEMPIC, 1 MG/DOSE, 4 MG/3ML SOPN   pregabalin (LYRICA) 75 MG capsule   silver sulfADIAZINE (SILVADENE) 1 % cream   tamsulosin (FLOMAX) 0.4 MG CAPS capsule   No current facility-administered medications for this encounter.    Konrad Felix Ward, PA-C [facility] [phone]

## 2021-03-30 NOTE — Anesthesia Preprocedure Evaluation (Addendum)
Anesthesia Evaluation  Patient identified by MRN, date of birth, ID band Patient awake    Reviewed: Allergy & Precautions, NPO status , Patient's Chart, lab work & pertinent test results  Airway Mallampati: II  TM Distance: >3 FB     Dental   Pulmonary sleep apnea , pneumonia, COPD, Current Smoker and Patient abstained from smoking.,    breath sounds clear to auscultation       Cardiovascular hypertension, + CAD, + Past MI and +CHF   Rhythm:Regular Rate:Normal     Neuro/Psych CVA    GI/Hepatic negative GI ROS, Neg liver ROS,   Endo/Other  diabetes  Renal/GU      Musculoskeletal  (+) Arthritis ,   Abdominal   Peds  Hematology   Anesthesia Other Findings   Reproductive/Obstetrics                            Anesthesia Physical Anesthesia Plan  ASA: 4  Anesthesia Plan: General   Post-op Pain Management:    Induction: Intravenous  PONV Risk Score and Plan: 2 and Ondansetron, Dexamethasone and Midazolam  Airway Management Planned: Oral ETT  Additional Equipment: Arterial line  Intra-op Plan:   Post-operative Plan: Extubation in OR  Informed Consent: I have reviewed the patients History and Physical, chart, labs and discussed the procedure including the risks, benefits and alternatives for the proposed anesthesia with the patient or authorized representative who has indicated his/her understanding and acceptance.     Dental advisory given  Plan Discussed with: CRNA and Anesthesiologist  Anesthesia Plan Comments: (See PAT note 03/30/2021, Konrad Felix Ward, PA-C)      Anesthesia Quick Evaluation

## 2021-04-04 ENCOUNTER — Encounter (HOSPITAL_COMMUNITY)
Admission: RE | Disposition: A | Discharge status: Home / Self Care | Payer: Self-pay | Source: Home / Self Care | Attending: Urology

## 2021-04-04 ENCOUNTER — Ambulatory Visit (HOSPITAL_COMMUNITY): Payer: Medicare HMO | Admitting: Physician Assistant

## 2021-04-04 ENCOUNTER — Observation Stay (HOSPITAL_COMMUNITY)
Admission: RE | Admit: 2021-04-04 | Discharge: 2021-04-06 | Disposition: A | Discharge status: Home / Self Care | Payer: Medicare HMO | Attending: Urology | Admitting: Urology

## 2021-04-04 ENCOUNTER — Ambulatory Visit (HOSPITAL_COMMUNITY): Payer: Medicare HMO | Admitting: Certified Registered"

## 2021-04-04 ENCOUNTER — Encounter (HOSPITAL_COMMUNITY): Payer: Self-pay | Admitting: Urology

## 2021-04-04 ENCOUNTER — Other Ambulatory Visit: Payer: Self-pay

## 2021-04-04 DIAGNOSIS — Z951 Presence of aortocoronary bypass graft: Secondary | ICD-10-CM | POA: Diagnosis not present

## 2021-04-04 DIAGNOSIS — F17210 Nicotine dependence, cigarettes, uncomplicated: Secondary | ICD-10-CM | POA: Insufficient documentation

## 2021-04-04 DIAGNOSIS — E119 Type 2 diabetes mellitus without complications: Secondary | ICD-10-CM | POA: Diagnosis not present

## 2021-04-04 DIAGNOSIS — I2510 Atherosclerotic heart disease of native coronary artery without angina pectoris: Secondary | ICD-10-CM | POA: Insufficient documentation

## 2021-04-04 DIAGNOSIS — Z79899 Other long term (current) drug therapy: Secondary | ICD-10-CM | POA: Diagnosis not present

## 2021-04-04 DIAGNOSIS — I509 Heart failure, unspecified: Secondary | ICD-10-CM | POA: Diagnosis not present

## 2021-04-04 DIAGNOSIS — G4733 Obstructive sleep apnea (adult) (pediatric): Secondary | ICD-10-CM | POA: Diagnosis not present

## 2021-04-04 DIAGNOSIS — J449 Chronic obstructive pulmonary disease, unspecified: Secondary | ICD-10-CM | POA: Insufficient documentation

## 2021-04-04 DIAGNOSIS — C61 Malignant neoplasm of prostate: Principal | ICD-10-CM | POA: Insufficient documentation

## 2021-04-04 DIAGNOSIS — C775 Secondary and unspecified malignant neoplasm of intrapelvic lymph nodes: Secondary | ICD-10-CM | POA: Diagnosis not present

## 2021-04-04 DIAGNOSIS — Z20822 Contact with and (suspected) exposure to covid-19: Secondary | ICD-10-CM | POA: Insufficient documentation

## 2021-04-04 DIAGNOSIS — Z7902 Long term (current) use of antithrombotics/antiplatelets: Secondary | ICD-10-CM | POA: Insufficient documentation

## 2021-04-04 DIAGNOSIS — Z7982 Long term (current) use of aspirin: Secondary | ICD-10-CM | POA: Insufficient documentation

## 2021-04-04 DIAGNOSIS — K429 Umbilical hernia without obstruction or gangrene: Secondary | ICD-10-CM | POA: Insufficient documentation

## 2021-04-04 DIAGNOSIS — E785 Hyperlipidemia, unspecified: Secondary | ICD-10-CM | POA: Diagnosis not present

## 2021-04-04 DIAGNOSIS — I110 Hypertensive heart disease with heart failure: Secondary | ICD-10-CM | POA: Insufficient documentation

## 2021-04-04 HISTORY — PX: ROBOT ASSISTED LAPAROSCOPIC RADICAL PROSTATECTOMY: SHX5141

## 2021-04-04 HISTORY — PX: LYMPH NODE DISSECTION: SHX5087

## 2021-04-04 LAB — CBC
HCT: 39.9 % (ref 39.0–52.0)
Hemoglobin: 13.2 g/dL (ref 13.0–17.0)
MCH: 29.9 pg (ref 26.0–34.0)
MCHC: 33.1 g/dL (ref 30.0–36.0)
MCV: 90.3 fL (ref 80.0–100.0)
Platelets: 167 10*3/uL (ref 150–400)
RBC: 4.42 MIL/uL (ref 4.22–5.81)
RDW: 13.9 % (ref 11.5–15.5)
WBC: 8.2 10*3/uL (ref 4.0–10.5)
nRBC: 0 % (ref 0.0–0.2)

## 2021-04-04 LAB — PROTIME-INR
INR: 1 (ref 0.8–1.2)
Prothrombin Time: 13 seconds (ref 11.4–15.2)

## 2021-04-04 LAB — CREATININE, SERUM
Creatinine, Ser: 1.15 mg/dL (ref 0.61–1.24)
GFR, Estimated: >60 mL/min (ref >60)

## 2021-04-04 LAB — GLUCOSE, CAPILLARY: Glucose-Capillary: 84 mg/dL (ref 70–99)

## 2021-04-04 LAB — TYPE AND SCREEN
ABO/RH(D): A POS
Antibody Screen: NEGATIVE

## 2021-04-04 LAB — SARS CORONAVIRUS 2 BY RT PCR (HOSPITAL ORDER, PERFORMED IN ~~LOC~~ HOSPITAL LAB): SARS Coronavirus 2: NEGATIVE

## 2021-04-04 LAB — HEMOGLOBIN AND HEMATOCRIT, BLOOD
HCT: 40.2 % (ref 39.0–52.0)
Hemoglobin: 13.2 g/dL (ref 13.0–17.0)

## 2021-04-04 LAB — APTT: aPTT: 28 seconds (ref 24–36)

## 2021-04-04 SURGERY — PROSTATECTOMY, RADICAL, ROBOT-ASSISTED, LAPAROSCOPIC
Anesthesia: General | Site: Prostate

## 2021-04-04 MED ORDER — EPHEDRINE 5 MG/ML INJ
INTRAVENOUS | Status: AC
  Filled 2021-04-04: qty 5

## 2021-04-04 MED ORDER — PROPOFOL 10 MG/ML IV BOLUS
INTRAVENOUS | Status: DC | PRN
Start: 2021-04-04 — End: 2021-04-04
  Administered 2021-04-04: 120 mg via INTRAVENOUS

## 2021-04-04 MED ORDER — ISOSORBIDE MONONITRATE ER 60 MG PO TB24
60.0000 mg | ORAL_TABLET | Freq: Two times a day (BID) | ORAL | Status: DC
  Administered 2021-04-04 – 2021-04-06 (×4): 60 mg via ORAL
  Filled 2021-04-04 (×4): qty 1

## 2021-04-04 MED ORDER — DOCUSATE SODIUM 100 MG PO CAPS
100.0000 mg | ORAL_CAPSULE | Freq: Two times a day (BID) | ORAL | Status: DC
Start: 2021-04-04 — End: 2021-04-06
  Administered 2021-04-04 – 2021-04-06 (×4): 100 mg via ORAL
  Filled 2021-04-04 (×4): qty 1

## 2021-04-04 MED ORDER — BACITRACIN-NEOMYCIN-POLYMYXIN 400-5-5000 EX OINT: 1.0000 "application " | TOPICAL_OINTMENT | Freq: Three times a day (TID) | CUTANEOUS | Status: DC | PRN

## 2021-04-04 MED ORDER — ACETAMINOPHEN 500 MG PO TABS
1000.0000 mg | ORAL_TABLET | Freq: Four times a day (QID) | ORAL | Status: AC
  Administered 2021-04-04 – 2021-04-05 (×4): 1000 mg via ORAL
  Filled 2021-04-04 (×4): qty 2

## 2021-04-04 MED ORDER — LIDOCAINE 2% (20 MG/ML) 5 ML SYRINGE: INTRAMUSCULAR | Status: DC | PRN

## 2021-04-04 MED ORDER — CEFAZOLIN SODIUM-DEXTROSE 2-4 GM/100ML-% IV SOLN
2.0000 g | INTRAVENOUS | Status: AC
  Administered 2021-04-04: 2 g via INTRAVENOUS
  Filled 2021-04-04: qty 100

## 2021-04-04 MED ORDER — DOCUSATE SODIUM 100 MG PO CAPS: 100.0000 mg | ORAL_CAPSULE | Freq: Two times a day (BID) | ORAL | Status: DC

## 2021-04-04 MED ORDER — OXYCODONE HCL 5 MG PO TABS
5.0000 mg | ORAL_TABLET | ORAL | Status: DC | PRN
  Administered 2021-04-05 – 2021-04-06 (×2): 5 mg via ORAL
  Filled 2021-04-04 (×2): qty 1

## 2021-04-04 MED ORDER — DULOXETINE HCL 30 MG PO CPEP
30.0000 mg | ORAL_CAPSULE | Freq: Every day | ORAL | Status: DC
  Administered 2021-04-04 – 2021-04-05 (×2): 30 mg via ORAL
  Filled 2021-04-04 (×2): qty 1

## 2021-04-04 MED ORDER — HEPARIN SODIUM (PORCINE) 5000 UNIT/ML IJ SOLN
5000.0000 [IU] | Freq: Three times a day (TID) | INTRAMUSCULAR | Status: DC
Start: 2021-04-04 — End: 2021-04-06
  Administered 2021-04-04 – 2021-04-06 (×5): 5000 [IU] via SUBCUTANEOUS
  Filled 2021-04-04 (×5): qty 1

## 2021-04-04 MED ORDER — PROPOFOL 10 MG/ML IV BOLUS
INTRAVENOUS | Status: AC
  Filled 2021-04-04: qty 20

## 2021-04-04 MED ORDER — SODIUM CHLORIDE 0.9 % IV SOLN
INTRAVENOUS | Status: DC | PRN
  Administered 2021-04-04: 40 mL

## 2021-04-04 MED ORDER — PHENYLEPHRINE 40 MCG/ML (10ML) SYRINGE FOR IV PUSH (FOR BLOOD PRESSURE SUPPORT)
PREFILLED_SYRINGE | INTRAVENOUS | Status: DC | PRN
  Administered 2021-04-04: 80 ug via INTRAVENOUS
  Administered 2021-04-04: 120 ug via INTRAVENOUS
  Administered 2021-04-04: 80 ug via INTRAVENOUS

## 2021-04-04 MED ORDER — CARVEDILOL 6.25 MG PO TABS
6.2500 mg | ORAL_TABLET | Freq: Two times a day (BID) | ORAL | Status: DC
  Administered 2021-04-05 – 2021-04-06 (×3): 6.25 mg via ORAL
  Filled 2021-04-04 (×3): qty 1

## 2021-04-04 MED ORDER — ROCURONIUM BROMIDE 10 MG/ML (PF) SYRINGE
PREFILLED_SYRINGE | INTRAVENOUS | Status: AC
  Filled 2021-04-04: qty 10

## 2021-04-04 MED ORDER — OXYCODONE-ACETAMINOPHEN 7.5-325 MG PO TABS: 1.0000 | ORAL_TABLET | Freq: Four times a day (QID) | ORAL | 0 refills | Status: DC | PRN

## 2021-04-04 MED ORDER — SUGAMMADEX SODIUM 200 MG/2ML IV SOLN
INTRAVENOUS | Status: DC | PRN
  Administered 2021-04-04: 200 mg via INTRAVENOUS

## 2021-04-04 MED ORDER — DIPHENHYDRAMINE HCL 50 MG/ML IJ SOLN: 12.5000 mg | Freq: Four times a day (QID) | INTRAMUSCULAR | Status: DC | PRN

## 2021-04-04 MED ORDER — ATORVASTATIN CALCIUM 40 MG PO TABS
40.0000 mg | ORAL_TABLET | Freq: Every day | ORAL | Status: DC
  Administered 2021-04-04 – 2021-04-06 (×3): 40 mg via ORAL
  Filled 2021-04-04 (×3): qty 1

## 2021-04-04 MED ORDER — DULOXETINE HCL 60 MG PO CPEP
60.0000 mg | ORAL_CAPSULE | Freq: Every day | ORAL | Status: DC
  Administered 2021-04-05 – 2021-04-06 (×2): 60 mg via ORAL
  Filled 2021-04-04 (×2): qty 1

## 2021-04-04 MED ORDER — CHLORHEXIDINE GLUCONATE 0.12 % MT SOLN: 15.0000 mL | Freq: Once | OROMUCOSAL | Status: AC

## 2021-04-04 MED ORDER — BELLADONNA ALKALOIDS-OPIUM 16.2-60 MG RE SUPP: 1.0000 | Freq: Four times a day (QID) | RECTAL | Status: DC | PRN

## 2021-04-04 MED ORDER — ALPRAZOLAM 1 MG PO TABS
1.0000 mg | ORAL_TABLET | Freq: Every day | ORAL | Status: DC
  Administered 2021-04-05 – 2021-04-06 (×2): 1 mg via ORAL
  Filled 2021-04-04 (×2): qty 1

## 2021-04-04 MED ORDER — FENTANYL CITRATE PF 50 MCG/ML IJ SOSY: 25.0000 ug | PREFILLED_SYRINGE | INTRAMUSCULAR | Status: DC | PRN

## 2021-04-04 MED ORDER — MIDAZOLAM HCL 2 MG/2ML IJ SOLN
INTRAMUSCULAR | Status: AC
  Filled 2021-04-04: qty 2

## 2021-04-04 MED ORDER — LACTATED RINGERS IR SOLN
Status: DC | PRN
  Administered 2021-04-04: 1000 mL

## 2021-04-04 MED ORDER — HYDROMORPHONE HCL 1 MG/ML IJ SOLN
0.5000 mg | INTRAMUSCULAR | Status: DC | PRN
  Administered 2021-04-04 – 2021-04-05 (×3): 1 mg via INTRAVENOUS
  Filled 2021-04-04 (×3): qty 1

## 2021-04-04 MED ORDER — DIPHENHYDRAMINE HCL 12.5 MG/5ML PO ELIX: 12.5000 mg | ORAL_SOLUTION | Freq: Four times a day (QID) | ORAL | Status: DC | PRN

## 2021-04-04 MED ORDER — ROCURONIUM BROMIDE 10 MG/ML (PF) SYRINGE
PREFILLED_SYRINGE | INTRAVENOUS | Status: DC | PRN
  Administered 2021-04-04: 50 mg via INTRAVENOUS

## 2021-04-04 MED ORDER — FENTANYL CITRATE (PF) 250 MCG/5ML IJ SOLN
INTRAMUSCULAR | Status: AC
  Filled 2021-04-04: qty 5

## 2021-04-04 MED ORDER — SACUBITRIL-VALSARTAN 49-51 MG PO TABS
1.0000 | ORAL_TABLET | Freq: Two times a day (BID) | ORAL | Status: DC
  Administered 2021-04-05 – 2021-04-06 (×3): 1 via ORAL
  Filled 2021-04-04 (×3): qty 1

## 2021-04-04 MED ORDER — DEXAMETHASONE SODIUM PHOSPHATE 10 MG/ML IJ SOLN
INTRAMUSCULAR | Status: DC | PRN
  Administered 2021-04-04: 8 mg via INTRAVENOUS

## 2021-04-04 MED ORDER — SODIUM CHLORIDE 0.45 % IV SOLN: INTRAVENOUS | Status: DC

## 2021-04-04 MED ORDER — LIDOCAINE 2% (20 MG/ML) 5 ML SYRINGE
INTRAMUSCULAR | Status: DC | PRN
  Administered 2021-04-04: 1.5 mg/kg/h via INTRAVENOUS

## 2021-04-04 MED ORDER — SODIUM CHLORIDE 0.9 % IV BOLUS
250.0000 mL | Freq: Once | INTRAVENOUS | Status: AC
  Administered 2021-04-04: 250 mL via INTRAVENOUS

## 2021-04-04 MED ORDER — BUPIVACAINE LIPOSOME 1.3 % IJ SUSP
INTRAMUSCULAR | Status: AC
  Filled 2021-04-04: qty 20

## 2021-04-04 MED ORDER — ONDANSETRON HCL 4 MG/2ML IJ SOLN
INTRAMUSCULAR | Status: DC | PRN
  Administered 2021-04-04: 4 mg via INTRAVENOUS

## 2021-04-04 MED ORDER — ONDANSETRON HCL 4 MG/2ML IJ SOLN
4.0000 mg | INTRAMUSCULAR | Status: DC | PRN
Start: 2021-04-04 — End: 2021-04-06

## 2021-04-04 MED ORDER — FENTANYL CITRATE (PF) 250 MCG/5ML IJ SOLN
INTRAMUSCULAR | Status: DC | PRN
  Administered 2021-04-04: 50 ug via INTRAVENOUS
  Administered 2021-04-04: 100 ug via INTRAVENOUS

## 2021-04-04 MED ORDER — STERILE WATER FOR INJECTION IJ SOLN
INTRAMUSCULAR | Status: DC | PRN
  Administered 2021-04-04: .4 mL via INTRAVENOUS

## 2021-04-04 MED ORDER — DEXAMETHASONE SODIUM PHOSPHATE 10 MG/ML IJ SOLN
INTRAMUSCULAR | Status: AC
  Filled 2021-04-04: qty 1

## 2021-04-04 MED ORDER — ONDANSETRON HCL 4 MG/2ML IJ SOLN
INTRAMUSCULAR | Status: AC
  Filled 2021-04-04: qty 2

## 2021-04-04 MED ORDER — PHENYLEPHRINE 40 MCG/ML (10ML) SYRINGE FOR IV PUSH (FOR BLOOD PRESSURE SUPPORT)
PREFILLED_SYRINGE | INTRAVENOUS | Status: AC
  Filled 2021-04-04: qty 10

## 2021-04-04 MED ORDER — STERILE WATER FOR IRRIGATION IR SOLN
Status: DC | PRN
  Administered 2021-04-04: 1000 mL

## 2021-04-04 MED ORDER — SODIUM CHLORIDE 0.9 % IV BOLUS
1000.0000 mL | Freq: Once | INTRAVENOUS | Status: AC
Start: 2021-04-04 — End: 2021-04-04
  Administered 2021-04-04: 1000 mL via INTRAVENOUS

## 2021-04-04 MED ORDER — EPHEDRINE SULFATE-NACL 50-0.9 MG/10ML-% IV SOSY
PREFILLED_SYRINGE | INTRAVENOUS | Status: DC | PRN
  Administered 2021-04-04 (×2): 5 mg via INTRAVENOUS
  Administered 2021-04-04: 10 mg via INTRAVENOUS
  Administered 2021-04-04 (×2): 5 mg via INTRAVENOUS

## 2021-04-04 MED ORDER — ALBUTEROL SULFATE (2.5 MG/3ML) 0.083% IN NEBU
3.0000 mL | INHALATION_SOLUTION | Freq: Four times a day (QID) | RESPIRATORY_TRACT | Status: DC | PRN
  Administered 2021-04-06: 3 mL via RESPIRATORY_TRACT
  Filled 2021-04-04: qty 3

## 2021-04-04 MED ORDER — PHENYLEPHRINE HCL (PRESSORS) 10 MG/ML IV SOLN
INTRAVENOUS | Status: AC
  Filled 2021-04-04: qty 2

## 2021-04-04 MED ORDER — FUROSEMIDE 20 MG PO TABS
20.0000 mg | ORAL_TABLET | Freq: Every day | ORAL | Status: DC
  Administered 2021-04-05 – 2021-04-06 (×2): 20 mg via ORAL
  Filled 2021-04-04 (×2): qty 1

## 2021-04-04 MED ORDER — KETAMINE HCL 10 MG/ML IJ SOLN
INTRAMUSCULAR | Status: DC | PRN
  Administered 2021-04-04: 30 mg via INTRAVENOUS

## 2021-04-04 MED ORDER — BACLOFEN 20 MG PO TABS
20.0000 mg | ORAL_TABLET | Freq: Two times a day (BID) | ORAL | Status: DC
  Administered 2021-04-04 – 2021-04-06 (×4): 20 mg via ORAL
  Filled 2021-04-04 (×4): qty 1

## 2021-04-04 MED ORDER — PREGABALIN 75 MG PO CAPS
75.0000 mg | ORAL_CAPSULE | Freq: Two times a day (BID) | ORAL | Status: DC
  Administered 2021-04-04 – 2021-04-06 (×4): 75 mg via ORAL
  Filled 2021-04-04 (×4): qty 1

## 2021-04-04 MED ORDER — DILTIAZEM HCL ER COATED BEADS 240 MG PO CP24
240.0000 mg | ORAL_CAPSULE | Freq: Every day | ORAL | Status: DC
  Administered 2021-04-05 – 2021-04-06 (×2): 240 mg via ORAL
  Filled 2021-04-04 (×2): qty 1

## 2021-04-04 MED ORDER — SULFAMETHOXAZOLE-TRIMETHOPRIM 800-160 MG PO TABS: 1.0000 | ORAL_TABLET | Freq: Two times a day (BID) | ORAL | 0 refills | Status: DC

## 2021-04-04 MED ORDER — LACTATED RINGERS IV SOLN: INTRAVENOUS | Status: DC

## 2021-04-04 MED ORDER — NITROGLYCERIN 0.4 MG SL SUBL: 0.4000 mg | SUBLINGUAL_TABLET | SUBLINGUAL | Status: DC | PRN

## 2021-04-04 MED ORDER — ORAL CARE MOUTH RINSE
15.0000 mL | Freq: Once | OROMUCOSAL | Status: AC
  Administered 2021-04-04: 15 mL via OROMUCOSAL

## 2021-04-04 MED ORDER — DAPSONE 25 MG PO TABS
50.0000 mg | ORAL_TABLET | Freq: Every day | ORAL | Status: DC
  Administered 2021-04-04 – 2021-04-06 (×3): 50 mg via ORAL
  Filled 2021-04-04 (×3): qty 2

## 2021-04-04 MED ORDER — SODIUM CHLORIDE (PF) 0.9 % IJ SOLN
INTRAMUSCULAR | Status: AC
  Filled 2021-04-04: qty 20

## 2021-04-04 MED ORDER — PHENYLEPHRINE HCL-NACL 20-0.9 MG/250ML-% IV SOLN
INTRAVENOUS | Status: DC | PRN
  Administered 2021-04-04: 35 ug/min via INTRAVENOUS

## 2021-04-04 SURGICAL SUPPLY — 74 items
ADH SKN CLS APL DERMABOND .7 (GAUZE/BANDAGES/DRESSINGS) ×3
APL PRP STRL LF DISP 70% ISPRP (MISCELLANEOUS) ×3
APL SWBSTK 6 STRL LF DISP (MISCELLANEOUS) ×3
APPLICATOR COTTON TIP 6 STRL (MISCELLANEOUS) ×3 IMPLANT
APPLICATOR COTTON TIP 6IN STRL (MISCELLANEOUS) ×4
BAG COUNTER SPONGE SURGICOUNT (BAG) IMPLANT
BAG SPNG CNTER NS LX DISP (BAG)
CATH FOLEY 2WAY SLVR 18FR 30CC (CATHETERS) ×4 IMPLANT
CATH TIEMANN FOLEY 18FR 5CC (CATHETERS) ×4 IMPLANT
CHLORAPREP W/TINT 26 (MISCELLANEOUS) ×4 IMPLANT
CLIP LIGATING HEMO O LOK GREEN (MISCELLANEOUS) ×4 IMPLANT
CLIP LIGATING HEMOLOK MED (MISCELLANEOUS) ×2 IMPLANT
CNTNR URN SCR LID CUP LEK RST (MISCELLANEOUS) ×3 IMPLANT
CONT SPEC 4OZ STRL OR WHT (MISCELLANEOUS) ×4
COVER SURGICAL LIGHT HANDLE (MISCELLANEOUS) ×4 IMPLANT
COVER TIP SHEARS 8 DVNC (MISCELLANEOUS) ×3 IMPLANT
COVER TIP SHEARS 8MM DA VINCI (MISCELLANEOUS) ×4
CUTTER ECHEON FLEX ENDO 45 340 (ENDOMECHANICALS) ×4 IMPLANT
DECANTER SPIKE VIAL GLASS SM (MISCELLANEOUS) ×4 IMPLANT
DERMABOND ADVANCED (GAUZE/BANDAGES/DRESSINGS) ×1
DERMABOND ADVANCED .7 DNX12 (GAUZE/BANDAGES/DRESSINGS) ×3 IMPLANT
DRAIN CHANNEL RND F F (WOUND CARE) IMPLANT
DRAPE ARM DVNC X/XI (DISPOSABLE) ×12 IMPLANT
DRAPE COLUMN DVNC XI (DISPOSABLE) ×3 IMPLANT
DRAPE DA VINCI XI ARM (DISPOSABLE) ×16
DRAPE DA VINCI XI COLUMN (DISPOSABLE) ×4
DRAPE SURG IRRIG POUCH 19X23 (DRAPES) ×4 IMPLANT
DRSG TEGADERM 4X4.75 (GAUZE/BANDAGES/DRESSINGS) ×4 IMPLANT
ELECT PENCIL ROCKER SW 15FT (MISCELLANEOUS) ×2 IMPLANT
ELECT REM PT RETURN 15FT ADLT (MISCELLANEOUS) ×4 IMPLANT
GAUZE 4X4 16PLY ~~LOC~~+RFID DBL (SPONGE) ×2 IMPLANT
GAUZE SPONGE 2X2 8PLY STRL LF (GAUZE/BANDAGES/DRESSINGS) IMPLANT
GAUZE SPONGE 4X4 12PLY STRL (GAUZE/BANDAGES/DRESSINGS) ×2 IMPLANT
GLOVE SURG ENC MOIS LTX SZ6.5 (GLOVE) ×4 IMPLANT
GLOVE SURG ENC TEXT LTX SZ7.5 (GLOVE) ×8 IMPLANT
GLOVE SURG UNDER POLY LF SZ7.5 (GLOVE) ×4 IMPLANT
GOWN STRL REUS W/TWL LRG LVL3 (GOWN DISPOSABLE) ×14 IMPLANT
HOLDER FOLEY CATH W/STRAP (MISCELLANEOUS) ×4 IMPLANT
IRRIG SUCT STRYKERFLOW 2 WTIP (MISCELLANEOUS) ×4
IRRIGATION SUCT STRKRFLW 2 WTP (MISCELLANEOUS) ×3 IMPLANT
IV LACTATED RINGERS 1000ML (IV SOLUTION) ×4 IMPLANT
KIT PROCEDURE DA VINCI SI (MISCELLANEOUS) ×4
KIT PROCEDURE DVNC SI (MISCELLANEOUS) ×3 IMPLANT
KIT TURNOVER KIT A (KITS) ×4 IMPLANT
NDL INSUFFLATION 14GA 120MM (NEEDLE) ×2 IMPLANT
NDL SPNL 22GX7 QUINCKE BK (NEEDLE) ×2 IMPLANT
NEEDLE INSUFFLATION 14GA 120MM (NEEDLE) ×4 IMPLANT
NEEDLE SPNL 22GX7 QUINCKE BK (NEEDLE) ×4 IMPLANT
PACK ROBOT UROLOGY CUSTOM (CUSTOM PROCEDURE TRAY) ×4 IMPLANT
PAD POSITIONING PINK XL (MISCELLANEOUS) ×4 IMPLANT
PORT ACCESS TROCAR AIRSEAL 12 (TROCAR) ×3 IMPLANT
PORT ACCESS TROCAR AIRSEAL 5M (TROCAR) ×1
RELOAD STAPLE 45 4.1 GRN THCK (STAPLE) ×2 IMPLANT
SEAL CANN UNIV 5-8 DVNC XI (MISCELLANEOUS) ×12 IMPLANT
SEAL XI 5MM-8MM UNIVERSAL (MISCELLANEOUS) ×16
SET TRI-LUMEN FLTR TB AIRSEAL (TUBING) ×4 IMPLANT
SOLUTION ELECTROLUBE (MISCELLANEOUS) ×4 IMPLANT
SPONGE GAUZE 2X2 STER 10/PKG (GAUZE/BANDAGES/DRESSINGS)
SPONGE T-LAP 4X18 ~~LOC~~+RFID (SPONGE) ×4 IMPLANT
STAPLE RELOAD 45 GRN (STAPLE) ×3 IMPLANT
STAPLE RELOAD 45MM GREEN (STAPLE) ×4
SUT ETHILON 3 0 PS 1 (SUTURE) ×4 IMPLANT
SUT MNCRL AB 4-0 PS2 18 (SUTURE) ×8 IMPLANT
SUT NOVA NAB GS-21 0 18 T12 DT (SUTURE) ×2 IMPLANT
SUT PDS AB 1 CT1 27 (SUTURE) ×8 IMPLANT
SUT VIC AB 2-0 SH 27 (SUTURE) ×4
SUT VIC AB 2-0 SH 27X BRD (SUTURE) ×3 IMPLANT
SUT VICRYL 0 UR6 27IN ABS (SUTURE) ×4 IMPLANT
SUT VLOC BARB 180 ABS3/0GR12 (SUTURE) ×12
SUTURE VLOC BRB 180 ABS3/0GR12 (SUTURE) ×9 IMPLANT
SYR 27GX1/2 1ML LL SAFETY (SYRINGE) ×4 IMPLANT
TOWEL OR NON WOVEN STRL DISP B (DISPOSABLE) ×4 IMPLANT
TROCAR XCEL NON-BLD 5MMX100MML (ENDOMECHANICALS) IMPLANT
WATER STERILE IRR 1000ML POUR (IV SOLUTION) ×4 IMPLANT

## 2021-04-04 NOTE — H&P (Signed)
[name] is an 55 y.o. male.    Chief Complaint: Pre-Op Prostatectomy  HPI:   1 - Large Volume Moderate Risk Prostate Cancer - 11/12 cores up to 90% grade 2 cancer by BX 10/2020 by [place] on eval PSA 19.8. TRUS 36 ml, no median. CT and BS localized. Preserved fat planes anteriorly and space of retzius on CT. Prior ventral hernia appears supraumbilical only.   2 - Lower Urinary Tract Symptoms - on tamsulosin at baseline with good control of obstructive symptoms. Prior PVR "59mL"   3 - Umbilical Hernia - fat containing umbililcal hernia by CT 2022. Has h/o prior ventral hernia requiring repair.   PMH sig for CAD/CABG (not limiting, follows Dr. Domenic [name] with cards, remains on plavix), CVA (Left side weakness, now in wheelchair) COPD/smoker, DM2 (A1c 6s), Ventral Hernia repair with mesh, CVA. His PCP is [name] Alberta MD with [facility] in [place]. His good friend and distant cousin [name] is very involved as well.   Today " [name] " is seen to proceed with prostatectomya nd umbilcal hernia repair for primary mamagement of his cancen. NO interval feversl He has held plavix as instructed and cleared by his cardiologist.    Past Medical History:  Diagnosis Date   Anaphylaxis    IGE mediated   Anxiety    Arthritis    Cancer ([place])    Prostate   Cardiomyopathy ([place])    CHF (congestive heart failure) ([place])    COPD (chronic obstructive pulmonary disease) ([place])    Coronary atherosclerosis of native coronary artery    Multivessel status post CABG 2010   Depression    Essential hypertension    History of CVA (cerebrovascular accident) 01/2012   Right posterior frontal cortical and subcortical brain by MRI, no hemorrhage. Carotid Dopplers showed only 1-50% bilateral ICA stenoses. Echocardiogram showed LVEF 50%, no major valvular abnormalities.   History of kidney stones    Mixed hyperlipidemia    Myocardial infarction ([place]) 2010   OSA (obstructive sleep apnea)    Pneumonia    Prostate  cancer ([place]) 12/2020   Dr. Tresa [name] at [facility]   Stroke [facility])    Type 2 diabetes mellitus [facility])     Past Surgical History:  Procedure Laterality Date   CHOLECYSTECTOMY     CORONARY ARTERY BYPASS GRAFT  2010   LIMA to LAD, SVG to diagonal, SVG to OM1 and OM 2, SVG to RCA   DENTAL SURGERY  2003   GASTRIC BYPASS  2010   HERNIA REPAIR  2011, 2012   [place] N/A 07/23/2013   Procedure: HERNIA REPAIR INCISIONAL WITH MESH;  Surgeon: [name] So, MD;  Location[facility];  Service: General;  Laterality: N/A;   INSERTION OF MESH N/A 07/23/2013   Procedure: INSERTION OF MESH;  Surgeon: [name] So, MD;  Location: [facility];  Service: General;  Laterality: N/A;   LEFT HEART CATHETERIZATION WITH CORONARY/GRAFT ANGIOGRAM N/A 11/01/2013   Procedure: LEFT HEART CATHETERIZATION WITH Beatrix Fetters;  Surgeon: [name] Ohara, MD;  Location: [facility] CATH LAB;  Service: Cardiovascular;  Laterality: N/A;   TOE AMPUTATION  1998   right 1st and 2nd toe   TONSILECTOMY, ADENOIDECTOMY, BILATERAL MYRINGOTOMY AND TUBES     TONSILLECTOMY     VENTRAL HERNIA REPAIR N/A 10/28/2012   Procedure: LAPAROSCOPIC VENTRAL HERNIA;  Surgeon: [name] Heinz, MD;  Location: [facility];  Service: General;  Laterality: N/A;    Family History  Problem Relation Age of Onset  Lung cancer Father    Heart disease Father    Heart disease Mother    Congestive Heart Failure Mother    Diabetes Mother    Hyperlipidemia Mother    Alcohol abuse Brother    Breast cancer Maternal Aunt    Suicidality Cousin    Social History:  reports that he has been smoking cigarettes. He started smoking about 38 years ago. He has a 30.00 pack-year smoking history. He has never used smokeless tobacco. He reports current alcohol use. He reports that he does not use drugs.  Allergies:  Allergies  Allergen Reactions   Contrast Media [Iodinated Diagnostic Agents] Anaphylaxis, Shortness Of Breath, Swelling and Rash    Isovue  contrast is most acceptable agent based on previous experience and testing with premedications   Ibuprofen Anaphylaxis, Hives and Swelling   Nsaids Anaphylaxis, Hives, Swelling and Rash    Can take Aspirin 325 mg or lower   Chantix [Varenicline]     Nightmares    Other Other (See Comments)    Anti-physcotics Personality change    Medications Prior to Admission  Medication Sig Dispense Refill   acetaminophen (TYLENOL) 500 MG tablet Take 1,000 mg by mouth every 6 (six) hours as needed for moderate pain or headache.     albuterol (PROVENTIL HFA;VENTOLIN HFA) 108 (90 BASE) MCG/ACT inhaler Inhale 2 puffs into the lungs every 6 (six) hours as needed for wheezing or shortness of breath.     ALPRAZolam (XANAX) 1 MG tablet 0.5-1 mg daily as needed for anxiety (Patient taking differently: Take 1 mg by mouth daily.) 30 tablet 2   aspirin EC 81 MG tablet Take 1 tablet (81 mg total) by mouth daily. 90 tablet 3   atorvastatin (LIPITOR) 40 MG tablet Take 1 tablet (40 mg total) by mouth daily. 30 tablet 6   baclofen (LIORESAL) 20 MG tablet Take 1 tablet (20 mg total) by mouth 2 (two) times daily. 60 tablet 5   carvedilol (COREG) 6.25 MG tablet Take 6.25 mg by mouth 2 (two) times daily with a meal.     Cholecalciferol (VITAMIN D) 2000 units tablet Take 2,000 Units by mouth daily.     clopidogrel (PLAVIX) 75 MG tablet Take 1 tablet by mouth daily.     dapsone 25 MG tablet Take 50 mg by mouth daily.     diltiazem (CARDIZEM CD) 240 MG 24 hr capsule Take 240 mg by mouth daily.      DULoxetine (CYMBALTA) 30 MG capsule Take 90 mg daily (60 mg + 30 mg) (Patient taking differently: Take 30 mg by mouth at bedtime.) 90 capsule 0   DULoxetine (CYMBALTA) 60 MG capsule Take 90 mg daily (60 mg + 30 mg) (Patient taking differently: Take 60 mg by mouth daily.) 90 capsule 0   ENTRESTO 49-51 MG TAKE (1) TABLET TWICE DAILY. 60 tablet 3   fluticasone (CUTIVATE) 0.05 % cream Apply 1 application topically 2 (two) times daily.      fluticasone (FLONASE) 50 MCG/ACT nasal spray Place 1 spray into both nostrils daily as needed for allergies.     furosemide (LASIX) 20 MG tablet Take 20 mg by mouth daily.      isosorbide mononitrate (IMDUR) 60 MG 24 hr tablet Take 60 mg by mouth 2 (two) times daily.      naloxone (NARCAN) nasal spray 4 mg/0.1 mL Place 1 spray into the nose as needed (opioid overdose).     nitroGLYCERIN (NITROSTAT) 0.4 MG SL tablet Place 0.4 mg  under the tongue every 5 (five) minutes as needed. For chest pain     oxyCODONE-acetaminophen (PERCOCET) 7.5-325 MG tablet Take 1 tablet by mouth 3 (three) times daily as needed for moderate pain.     OZEMPIC, 1 MG/DOSE, 4 MG/3ML SOPN Inject 1 mg as directed every Friday.     pregabalin (LYRICA) 75 MG capsule Take 1 capsule (75 mg total) by mouth 2 (two) times daily. 60 capsule 5   tamsulosin (FLOMAX) 0.4 MG CAPS capsule Take 1 capsule (0.4 mg total) by mouth daily after supper. 30 capsule 11   silver sulfADIAZINE (SILVADENE) 1 % cream Apply 1 application topically 3 (three) times daily.      Results for orders placed or performed during the hospital encounter of 04/04/21 (from the past 48 hour(s))  SARS Coronavirus 2 by RT PCR (hospital order, performed in [facility] hospital lab) Nasopharyngeal Nasopharyngeal Swab     Status: None   Collection Time: 04/04/21  8:51 AM   Specimen: Nasopharyngeal Swab  Result Value Ref Range   SARS Coronavirus 2 NEGATIVE NEGATIVE    Comment: (NOTE) SARS-CoV-2 target nucleic acids are NOT DETECTED.  The SARS-CoV-2 RNA is generally detectable in upper and lower respiratory specimens during the acute phase of infection. The lowest concentration of SARS-CoV-2 viral copies this assay can detect is 250 copies / mL. A negative result does not preclude SARS-CoV-2 infection and should not be used as the sole basis for treatment or other patient management decisions.  A negative result may occur with improper specimen collection /  handling, submission of specimen other than nasopharyngeal swab, presence of viral mutation(s) within the areas targeted by this assay, and inadequate number of viral copies (<250 copies / mL). A negative result must be combined with clinical observations, patient history, and epidemiological information.  Fact Sheet for Patients:   StrictlyIdeas.no  Fact Sheet for Healthcare Providers: BankingDealers.co.za  This test is not yet approved or  cleared by the [place] FDA and has been authorized for detection and/or diagnosis of SARS-CoV-2 by FDA under an Emergency Use Authorization (EUA).  This EUA will remain in effect (meaning this test can be used) for the duration of the COVID-19 declaration under Section 564(b)(1) of the Act, 21 U.S.C. section 360bbb-3(b)(1), unless the authorization is terminated or revoked sooner.  Performed at [facility], [place] [address], [place], [place] [address]   Type and screen [place]     Status: None   Collection Time: 04/04/21  9:10 AM  Result Value Ref Range   ABO/RH(D) A POS    Antibody Screen NEG    Sample Expiration      04/07/2021,2359 Performed at [facility], [place] [address], [place], [place] [address]   Glucose, capillary     Status: None   Collection Time: 04/04/21  9:20 AM  Result Value Ref Range   Glucose-Capillary 84 70 - 99 mg/dL    Comment: Glucose reference range applies only to samples taken after fasting for at least 8 hours.   No results found.  Review of Systems  Constitutional:  Negative for chills and fever.  All other systems reviewed and are negative.  Blood pressure (!) 91/48, pulse 67, temperature 98 F (36.7 C), temperature source Oral, resp. rate 16, SpO2 95 %. Physical Exam Vitals reviewed.  Constitutional:      Comments: Appears older than stated age, pleasant, at baseline.   HENT:     Head:  Normocephalic.  Nose: Nose normal.  Eyes:     Pupils: Pupils are equal, round, and reactive to light.  Cardiovascular:     Rate and Rhythm: Normal rate.  Pulmonary:     Effort: Pulmonary effort is normal.  Abdominal:     General: Abdomen is flat.     Comments: Prior scars noted.   Genitourinary:    Penis: Normal.   Skin:    General: Skin is warm.  Neurological:     Mental Status: He is alert.     Comments: Stable sigmata of prior CVA with left sided weakness.      Assessment/Plan  Proceed as planned with prostatectomy / node dissection / umbilical hernia repair. Risks, benefits, alternatives, expected peri-op course discussed previously and reiterated today. He understands that his baseline CV and neurologic disease increase risk or ALL peri-o pcomplications including MI, CVA, infections, mortality.   Alexis Frock, MD 04/04/2021, 11:02 AM

## 2021-04-04 NOTE — Anesthesia Procedure Notes (Signed)
Arterial Line Insertion Start/End9/21/2022 11:40 AM Performed by: Shaconda Hajduk D, CRNA, CRNA  Patient location: Pre-op. Preanesthetic checklist: patient identified, IV checked, site marked, risks and benefits discussed, surgical consent, monitors and equipment checked, pre-op evaluation, timeout performed and anesthesia consent Lidocaine 1% used for infiltration radial was placed Catheter size: 20 G Hand hygiene performed  and maximum sterile barriers used  Allen's test indicative of satisfactory collateral circulation Attempts: 1 Procedure performed without using ultrasound guided technique. Following insertion, dressing applied. Post procedure assessment: normal and unchanged  Patient tolerated the procedure well with no immediate complications.

## 2021-04-04 NOTE — Discharge Instructions (Addendum)
Activity:  You are encouraged to ambulate frequently (about every hour during waking hours) to help prevent blood clots from forming in your legs or lungs.  However, you should not engage in any heavy lifting (> 10-15 lbs), strenuous activity, or straining. Diet: You should continue a clear liquid diet until passing gas from below.  Once this occurs, you may advance your diet to a soft diet that would be easy to digest (i.e soups, scrambled eggs, mashed potatoes, etc.) for 24 hours just as you would if getting over a bad stomach flu.  If tolerating this diet well for 24 hours, you may then begin eating regular food.  It will be normal to have some amount of bloating, nausea, and abdominal discomfort intermittently. Prescriptions:  You will be provided a prescription for pain medication to take as needed.  If your pain is not severe enough to require the prescription pain medication, you may take Tylenol instead.  You should also take an over the counter stool softener (Colace 100 mg twice daily) to avoid straining with bowel movements as the pain medication may constipate you. Finally, you will also be provided a prescription for an antibiotic to begin the day prior to your return visit in the office for catheter removal. Catheter care: You will be taught how to take care of the catheter by the nursing staff prior to discharge from the hospital.  You may use both a leg bag and the larger bedside bag but it is recommended to at least use the bigger bedside bag at nighttime as the leg bag is small and will fill up overnight and also does not drain as well when lying flat. You may periodically feel a strong urge to void with the catheter in place.  This is a bladder spasm and most often can occur when having a bowel movement or when you are moving around. It is typically self-limited and usually will stop after a few minutes.  You may use some Vaseline or Neosporin around the tip of the catheter to reduce friction  at the tip of the penis. Incisions: You may remove your dressing bandages the 2nd day after surgery.  You most likely will have a few small staples in each of the incisions and once the bandages are removed, the incisions may stay open to air.  You may start showering (not soaking or bathing in water) 48 hours after surgery and the incisions simply need to be patted dry after the shower.  No additional care is needed. What to call us about: You should call the office [phone]) if you develop fever > 101, persistent vomiting, or the catheter stops draining. Also, feel free to call with any other questions you may have and remember the handout that was provided to you as a reference preoperatively which answers many of the common questions that arise after surgery. You may resume aspirin, advil, aleve, vitamins, and supplements 7 days after surgery.   You may resume Plavix on Monday 04/09/21.

## 2021-04-04 NOTE — Brief Op Note (Signed)
04/04/2021  2:01 PM  PATIENT:  [name]  55 y.o. male  PRE-OPERATIVE DIAGNOSIS:  PROSTATE CANCER, UMBILICAL HERNIA  POST-OPERATIVE DIAGNOSIS:  PROSTATE CANCER,   PROCEDURE:  Procedure(s) with comments: XI ROBOTIC ASSISTED LAPAROSCOPIC RADICAL PROSTATECTOMY WITH INDOCYANINE GREEN DYE (N/A) - 3 HRS PELVIC LYMPH NODE DISSECTION (Bilateral)  SURGEON:  Surgeon(s) and Role:    * Alexis Frock, MD - Primary  PHYSICIAN ASSISTANT:   ASSISTANTS: Clemetine Marker PA   ANESTHESIA:   local and general  EBL:  250 mL   BLOOD ADMINISTERED:none  DRAINS:  1- JP to bulb; 2 - Foley to gravity    LOCAL MEDICATIONS USED:  MARCAINE     SPECIMEN:  Source of Specimen:  1- prostate; 2 - pelvic lymph nodes; 3 - periprostatic fat  DISPOSITION OF SPECIMEN:  PATHOLOGY  COUNTS:  YES  TOURNIQUET:  * No tourniquets in log *  DICTATION: .Other Dictation: Dictation Number [id number]  PLAN OF CARE: Admit for overnight observation  PATIENT DISPOSITION:  PACU - hemodynamically stable.   Delay start of Pharmacological VTE agent (>24hrs) due to surgical blood loss or risk of bleeding: no

## 2021-04-04 NOTE — Anesthesia Procedure Notes (Signed)
Procedure Name: Intubation Date/Time: 04/04/2021 11:50 AM Performed by: Demitrious Mccannon D, CRNA Pre-anesthesia Checklist: Patient identified, Emergency Drugs available, Suction available, Patient being monitored and Timeout performed Patient Re-evaluated:Patient Re-evaluated prior to induction Oxygen Delivery Method: Circle system utilized Preoxygenation: Pre-oxygenation with 100% oxygen Induction Type: IV induction Ventilation: Mask ventilation without difficulty and Oral airway inserted - appropriate to patient size Laryngoscope Size: Mac and 4 Grade View: Grade I Tube type: Oral Tube size: 7.5 mm Number of attempts: 1 Airway Equipment and Method: Stylet Placement Confirmation: ETT inserted through vocal cords under direct vision, positive ETCO2 and breath sounds checked- equal and bilateral Secured at: 23 cm Tube secured with: Tape Dental Injury: Teeth and Oropharynx as per pre-operative assessment

## 2021-04-04 NOTE — Transfer of Care (Signed)
Immediate Anesthesia Transfer of Care Note  Patient: GOKU HARB  Procedure(s) Performed: XI ROBOTIC ASSISTED LAPAROSCOPIC RADICAL PROSTATECTOMY WITH INDOCYANINE GREEN DYE (Prostate) PELVIC LYMPH NODE DISSECTION (Bilateral: Pelvis)  Patient Location: PACU  Anesthesia Type:General  Level of Consciousness: awake, alert  and oriented  Airway & Oxygen Therapy: Patient Spontanous Breathing and Patient connected to face mask oxygen  Post-op Assessment: Report given to RN and Post -op Vital signs reviewed and stable  Post vital signs: Reviewed and stable  Last Vitals:  Vitals Value Taken Time  BP 98/55 04/04/21 1425  Temp    Pulse 62 04/04/21 1427  Resp 14 04/04/21 1427  SpO2 91 % 04/04/21 1427  Vitals shown include unvalidated device data.  Last Pain:  Vitals:   04/04/21 0931  TempSrc: Oral         Complications: No notable events documented.

## 2021-04-04 NOTE — Anesthesia Postprocedure Evaluation (Signed)
Anesthesia Post Note  Patient: [name]  Procedure(s) Performed: XI ROBOTIC ASSISTED LAPAROSCOPIC RADICAL PROSTATECTOMY WITH INDOCYANINE Chanette Demo DYE (Prostate) PELVIC LYMPH NODE DISSECTION (Bilateral: Pelvis)     Patient location during evaluation: PACU Anesthesia Type: General Level of consciousness: awake Pain management: pain level controlled Vital Signs Assessment: post-procedure vital signs reviewed and stable Respiratory status: spontaneous breathing Cardiovascular status: stable Postop Assessment: no apparent nausea or vomiting Anesthetic complications: no   No notable events documented.  Last Vitals:  Vitals:   04/04/21 1700 04/04/21 1729  BP: (!) 107/58 (!) 97/55  Pulse: 60 (!) 58  Resp: 15 16  Temp:  (!) 36.3 C  SpO2: 92% 93%    Last Pain:  Vitals:   04/04/21 1751  TempSrc:   PainSc: 5                  [place]

## 2021-04-05 ENCOUNTER — Encounter (HOSPITAL_COMMUNITY): Payer: Self-pay | Admitting: Urology

## 2021-04-05 DIAGNOSIS — C775 Secondary and unspecified malignant neoplasm of intrapelvic lymph nodes: Secondary | ICD-10-CM | POA: Diagnosis not present

## 2021-04-05 DIAGNOSIS — E119 Type 2 diabetes mellitus without complications: Secondary | ICD-10-CM | POA: Diagnosis not present

## 2021-04-05 DIAGNOSIS — I2510 Atherosclerotic heart disease of native coronary artery without angina pectoris: Secondary | ICD-10-CM | POA: Diagnosis not present

## 2021-04-05 DIAGNOSIS — K429 Umbilical hernia without obstruction or gangrene: Secondary | ICD-10-CM | POA: Diagnosis not present

## 2021-04-05 DIAGNOSIS — I110 Hypertensive heart disease with heart failure: Secondary | ICD-10-CM | POA: Diagnosis not present

## 2021-04-05 DIAGNOSIS — Z20822 Contact with and (suspected) exposure to covid-19: Secondary | ICD-10-CM | POA: Diagnosis not present

## 2021-04-05 DIAGNOSIS — J449 Chronic obstructive pulmonary disease, unspecified: Secondary | ICD-10-CM | POA: Diagnosis not present

## 2021-04-05 DIAGNOSIS — C61 Malignant neoplasm of prostate: Secondary | ICD-10-CM | POA: Diagnosis not present

## 2021-04-05 DIAGNOSIS — Z951 Presence of aortocoronary bypass graft: Secondary | ICD-10-CM | POA: Diagnosis not present

## 2021-04-05 LAB — BASIC METABOLIC PANEL
Anion gap: 10 (ref 5–15)
BUN: 19 mg/dL (ref 6–20)
CO2: 21 mmol/L — ABNORMAL LOW (ref 22–32)
Calcium: 8.5 mg/dL — ABNORMAL LOW (ref 8.9–10.3)
Chloride: 103 mmol/L (ref 98–111)
Creatinine, Ser: 1.12 mg/dL (ref 0.61–1.24)
GFR, Estimated: >60 mL/min (ref >60)
Glucose, Bld: 120 mg/dL — ABNORMAL HIGH (ref 70–99)
Potassium: 5.6 mmol/L — ABNORMAL HIGH (ref 3.5–5.1)
Sodium: 134 mmol/L — ABNORMAL LOW (ref 135–145)

## 2021-04-05 LAB — HEMOGLOBIN AND HEMATOCRIT, BLOOD
HCT: 43.7 % (ref 39.0–52.0)
Hemoglobin: 14.5 g/dL (ref 13.0–17.0)

## 2021-04-05 MED ORDER — CHLORHEXIDINE GLUCONATE CLOTH 2 % EX PADS
6.0000 | MEDICATED_PAD | Freq: Every day | CUTANEOUS | Status: DC
  Administered 2021-04-06: 6 via TOPICAL

## 2021-04-05 NOTE — TOC Progression Note (Addendum)
Transition of Care [facility]) - Progression Note    Patient Details  Name: [name] MRN: [id number] Date of Birth: 10/03/1965  [facility] CM/SW Contact  Terina Mcelhinny, Juliann Pulse, RN Phone Number: 04/05/2021, 1:35 PM  Clinical Narrative: Left vm w/spouse [name]-if interested in PT recc HHPT-await call back. 2p-spoke to [employer] in past would like them again-TC rep Amy with [facility]-await if can accept referral.      Expected Discharge Plan: [place] Barriers to Discharge: Continued Medical Work up  Expected Discharge Plan and Services Expected Discharge Plan: [place]   Discharge Planning Services: CM Consult Post Acute Care Choice: Resumption of Svcs/PTA Provider                                         Social Determinants of Health (SDOH) Interventions    Readmission Risk Interventions No flowsheet data found.

## 2021-04-05 NOTE — Evaluation (Signed)
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  Pt should progress to dc home with 24/7 assist and would benefit from follow up HHPT to further address deficits.  Pt eager for return home.     Recommendations for follow up therapy are one component of a multi-disciplinary discharge planning process, led by the attending physician.  Recommendations may be updated based on patient status, additional functional criteria and insurance authorization.  Follow Up Recommendations Home health PT    Equipment Recommendations  None recommended by PT    Recommendations for Other Services       Precautions / Restrictions Precautions Precautions: Fall;Other (comment) Precaution Comments: JP drain on R Required Braces or Orthoses: Other Brace Other Brace: AFO on L Restrictions Weight Bearing Restrictions: No      Mobility  Bed Mobility Overal bed mobility: Needs Assistance Bed Mobility: Supine to Sit     Supine to sit: HOB elevated;Min assist     General bed mobility comments: HOB elevated, Increased time, assist to manage L LE and to complete rotation to EOB sitting using pad    Transfers Overall transfer level: Needs assistance Equipment used: Hemi-walker Transfers: Sit to/from Stand Sit to Stand: Min assist;From elevated surface;+2 safety/equipment         General transfer comment: Pt self-cues; assist to bring wt up and fwd and to balance in initial standing  Ambulation/Gait Ambulation/Gait assistance: Min  assist;+2 safety/equipment Gait Distance (Feet): 10 Feet Assistive device: Hemi-walker Gait Pattern/deviations: Step-to pattern;Decreased step length - right;Decreased step length - left;Shuffle;Decreased stance time - left;Antalgic;Trunk flexed;Decreased dorsiflexion - left Gait velocity: decr   General Gait Details: Increased time, increased difficulty advancing L LE with noted foot drop and increased pelvic rotation; L knee hyper-extension with WB.  Stairs            Wheelchair Mobility    Modified Rankin (Stroke Patients Only)       Balance Overall balance assessment: Needs assistance Sitting-balance support: Feet supported;No upper extremity supported Sitting balance-Leahy Scale: Good     Standing balance support: Single extremity supported Standing balance-Leahy Scale: Poor                               Pertinent Vitals/Pain Pain Assessment: 0-10 Pain Score: 6  Pain Location: lower R abdomen Pain Descriptors / Indicators: Grimacing;Sore Pain Intervention(s): Limited activity within patient's tolerance;Monitored during session;Patient requesting pain meds-RN notified    Home Living Family/patient expects to be discharged to:: Private residence Living Arrangements: Spouse/significant other;Children;Other relatives Available Help at Discharge: Family;Personal care attendant Type of Home: House Home Access: Ramped entrance     Home Layout: One level Home Equipment: Bedside commode;Wheelchair - manual;Other (comment) (lift chair, mechanical bed) Additional Comments: Pt reports aide 5 days a week from 8-4.  Pt reports has all equipment in place since previous CVA    Prior Function Level of Independence: Needs assistance   Gait / Transfers Assistance Needed: Limited ambulation with hemi-walker, uses WC most of time  ADL's / Homemaking Assistance Needed: SPouse  and aide        Hand Dominance   Dominant Hand: Right    Extremity/Trunk  Assessment   Upper Extremity Assessment Upper Extremity Assessment: LUE deficits/detail LUE Deficits / Details: Increased tone with no voluntary movement    Lower Extremity Assessment Lower Extremity Assessment: LLE deficits/detail LLE Deficits / Details: Increased tone and limited voluntary movement since prior CVA       Communication   Communication: No difficulties  Cognition Arousal/Alertness: Awake/alert Behavior During Therapy: WFL for tasks assessed/performed Overall Cognitive Status: Within Functional Limits for tasks assessed                                        General Comments      Exercises     Assessment/Plan    PT Assessment Patient needs continued PT services  PT Problem List Decreased strength;Decreased activity tolerance;Decreased balance;Decreased mobility;Decreased range of motion;Decreased coordination;Pain       PT Treatment Interventions DME instruction;Gait training;Functional mobility training;Therapeutic activities;Therapeutic exercise;Patient/family education    PT Goals (Current goals can be found in the Care Plan section)  Acute Rehab PT Goals Patient Stated Goal: HOME today1 PT Goal Formulation: With patient Time For Goal Achievement: 04/19/21 Potential to Achieve Goals: Good    Frequency Min 3X/week   Barriers to discharge        Co-evaluation               AM-PAC PT "6 Clicks" Mobility  Outcome Measure Help needed turning from your back to your side while in a flat bed without using bedrails?: A Lot Help needed moving from lying on your back to sitting on the side of a flat bed without using bedrails?: A Lot Help needed moving to and from a bed to a chair (including a wheelchair)?: A Lot Help needed standing up from a chair using your arms (e.g., wheelchair or bedside chair)?: A Lot Help needed to walk in hospital room?: A Little Help needed climbing 3-5 steps with a railing? : A Lot 6 Click Score: 13     End of Session Equipment Utilized During Treatment: Gait belt;Other (comment) (AFO and shoes) Activity Tolerance: Patient tolerated treatment well;Patient limited by fatigue;Patient limited by pain Patient left: in chair;with call bell/phone within reach;with chair alarm set Nurse Communication: Mobility status PT Visit Diagnosis: Unsteadiness on feet (R26.81);Difficulty in walking, not elsewhere classified (R26.2);Hemiplegia and hemiparesis;Pain Hemiplegia - Right/Left: Left Hemiplegia - dominant/non-dominant: Non-dominant Hemiplegia - caused by: Cerebral infarction Pain - Right/Left: Right (lower abdomen)    Time: 2409-7353 PT Time Calculation (min) (ACUTE ONLY): 30 min   Charges:   PT Evaluation $PT Eval Moderate Complexity: 1 Mod PT Treatments $Therapeutic Activity: 8-22 mins        Debe Coder PT Acute Rehabilitation Services Pager [phone] Office [phone]   Leocadia Idleman 04/05/2021, 1:06 PM

## 2021-04-05 NOTE — Progress Notes (Signed)
1 Day Post-Op   Subjective/Chief Complaint:  1 - Large Volume Prostate Cancer - s/p robotic radical prostatectomy 04/04/21. Path pending.   2 - Disposition / Rehab - pt with left sided weakness in wheelchair at baselie. Has very good family support.  Today "Gerald Stabs" is progressing. Able to transition to chair, tollerating PO, Hgb acceptable. Borderline high K noted, but likely related to K containinng fluids as GFR excellent.    Objective: Vital signs in last 24 hours: Temp:  [97.4 F (36.3 C)-98.1 F (36.7 C)] 98.1 F (36.7 C) (09/22 1139) Pulse Rate:  [54-72] 72 (09/22 1139) Resp:  [14-20] 15 (09/22 1139) BP: (84-107)/(49-63) 97/49 (09/22 1139) SpO2:  [87 %-100 %] 97 % (09/22 1139) Arterial Line BP: (110-132)/(47-54) 123/53 (09/21 1630) Weight:  [102.9 kg] 102.9 kg (09/21 2000) Last BM Date: 04/03/21  Intake/Output from previous day: 09/21 0701 - 09/22 0700 In: 1900 [I.V.:1800; IV Piggyback:100] Out: 1050 [Urine:500; Drains:300; Blood:250] Intake/Output this shift: No intake/output data recorded.  General appearance: alert and cooperative Eyes: negative Nose: Nares normal. Septum midline. Mucosa normal. No drainage or sinus tenderness. Throat: lips, mucosa, and tongue normal; teeth and gums normal Neck: supple, symmetrical, trachea midline Back: symmetric, no curvature. ROM normal. No CVA tenderness. Resp: non-labored on room air.  Cardio: Nl rate GI: soft, non-tender; bowel sounds normal; no masses,  no organomegaly and stable obesity. LLQ JP with serosanguinous fluid that is non-foul Male genitalia: normal, foley in place with thin blood tinged urine.  Extremities: extremities normal, atraumatic, no cyanosis or edema and stable subtle wasting LUE and LLE.  Neurologic: Grossly normal Stable stigmata of CVA.   Lab Results:  Recent Labs    04/04/21 1736 04/05/21 0611  WBC 8.2  --   HGB 13.2 14.5  HCT 39.9 43.7  PLT 167  --    BMET Recent Labs    04/04/21 1736  04/05/21 0611  NA  --  134*  K  --  5.6*  CL  --  103  CO2  --  21*  GLUCOSE  --  120*  BUN  --  19  CREATININE 1.15 1.12  CALCIUM  --  8.5*   PT/INR Recent Labs    04/04/21 0910  LABPROT 13.0  INR 1.0   ABG No results for input(s): PHART, HCO3 in the last 72 hours.  Invalid input(s): PCO2, PO2  Studies/Results: No results found.  Anti-infectives: Anti-infectives (From admission, onward)    Start     Dose/Rate Route Frequency Ordered Stop   04/04/21 1800  dapsone tablet 50 mg        50 mg Oral Daily 04/04/21 1716     04/04/21 0850  ceFAZolin (ANCEF) IVPB 2g/100 mL premix        2 g 200 mL/hr over 30 Minutes Intravenous 30 min pre-op 04/04/21 0850 04/04/21 1221   04/04/21 0000  sulfamethoxazole-trimethoprim (BACTRIM DS) 800-160 MG tablet        1 tablet Oral 2 times daily 04/04/21 1421         Assessment/Plan:  Doing well POD 1. Goals for DC discussed. Rec check JP Cr and likley DC tomorrow. He is nearly at functional baseline. Continue proph heparing given sig CV history.    Alexis Frock 04/05/2021

## 2021-04-05 NOTE — Op Note (Signed)
NAME: [name], [name] MEDICAL RECORD NO: [id number] ACCOUNT NO: [id number] DATE OF BIRTH: [birth year] FACILITY: [facility] LOCATION: [id number] PHYSICIAN: Alexis Frock, MD  Operative Report   DATE OF PROCEDURE: 04/04/2021  PREOPERATIVE DIAGNOSIS:  Large volume prostate cancer.  PROCEDURES: 1.  Robotic-assisted laparoscopic radical prostatectomy. 2.  Pelvic lymph node dissection.  ESTIMATED BLOOD LOSS:  250 mL.  COMPLICATIONS:  None.  SPECIMENS:    1.  Right external iliac lymph nodes. 2.  Right obturator lymph nodes. 3.  Left external iliac lymph node, sentinel. 4.  Left obturator lymph node, sentinel. 5.  Radical prostatectomy. 6.  Periprostatic fat.  DRAINS:    1.  Jackson-Pratt drain to bulb suction. 2.  Foley catheter to straight drain.  FINDINGS:    1.  No evidence of umbilical hernia, likely radiographic finding anatomic. 2.  Fairly large matted left external iliac lymph nodes.  These were also sentinel. 3.  Minimal intra-abdominal adhesions despite prior multiple abdominal surgeries.  INDICATIONS:  The patient is a pleasant, but unfortunate 55 year old man with history of heart bypass, prior stroke, and GI surgery.  He was found on workup of elevated PSA to have a very large volume adenocarcinoma of the prostate.  Staging imaging  clinically localized.  Given his young age, he elected a curative intent therapy with surgery understanding that he is not an ideal surgical candidate, but given his young age and baseline already problematic obstructive urinary symptoms, this likely was  optimal primary therapy in the long run. He wished to proceed.  Informed consent was obtained and placed in the medical record.  DESCRIPTION OF PROCEDURE:  The patient being [name] verified.  Procedure being radical prostatectomy was confirmed with umbilical hernia repair.  Procedure timeout was performed.  Intravenous antibiotics were administered.  General  endotracheal anesthesia  was induced.  The patient was placed into a low lithotomy position.  Sterile field was created by prepping and draping the patient's penis, perineum, and proximal thigh using iodine and his infra-xiphoid abdomen using  chlorhexidine gluconate.  He was further fashioned to the operating table using 3-inch tape over foam padding across his chest.  His arms were tucked to the side with gel rolls.  He was clipper shaved.  Foley catheter was placed per urethra to straight  drain.  A test steep Trendelenburg positioning was performed.  He was found to be suitably positioned.  Next, a high flow, low pressure pneumoperitoneum was obtained using Veress technique in the supraumbilical midline having passed the aspiration drop  test and verifying preserved fat planes by axial imaging.  Next, an 8 mm robotic camera port was then placed in location.  Laparoscopic examination of the peritoneal cavity revealed minimal adhesions in the upper abdomen well away from the pelvis and no  evidence of visceral injury.  At this point, he did not have impressive umbilical hernia defect.  Additional ports were placed as follows:  Right paramedian 8 mm robotic port, right far lateral 12 mm AirSeal assistant port, right paramedian 5 mm suction  port, left paramedian 8 mm robotic port, left far lateral 8 mm robotic port.  The robot was docked and passed the electronic checks.  Initial attention was directed at development of the space of Retzius.  An incision was made lateral to the left median  umbilical ligament from the midline towards the area of internal ring coursing along the iliac vessels towards the area of the left ureter.  The left vas deferens was encountered, ligated, and  placed on gentle medial traction.  Dissection proceeded  lateral to the bladder towards the endopelvic fascia on the left side.  A mirror image dissection was performed on the right side.  Anterior attachment was taken down with cautery scissors to expose  the anterior base of the prostate, which was defatted  to better denote the bladder neck prostate junction.  This was set aside and labeled as periprostatic fat.  Next, 0.2 mL of indocyanine green dye was injected in each lobe of the prostate using a percutaneously placed robotically guided spinal needle for  sentinel lymphangiography.  The endopelvic fascia was then swept away from the lateral aspect of the prostate in base to apex orientation.  This exposed the dorsal venous complex, which was controlled using green load stapler, which revealed an  excellent hemostatic control.  It had been approximately 10 minutes post dye injection.  The pelvis was inspected under near infrared fluorescence light.  There was excellent parenchymal uptake of the prostate.  With ICG dye, there were several areas of sentinel lymph nodes seen in the left external iliac, obturator groups, but none on the right side.  As such, standard template lymphadenectomy was  performed on the right side, first the left and right external iliac group with the boundaries being right external iliac artery, vein, pelvic sidewall, iliac bifurcation.  Lymphostasis was achieved with cold clips, set aside and labeled as right  external iliac lymph nodes.  Next, the right obturator group was dissected free with the boundaries being right external iliac vein, pelvic sidewall, obturator nerve.  Lymphostasis was achieved with cold clips, set aside and labeled.The right obturator nerve was inspected and found to be intact.  A mirror image lymphadenopathy was performed on the left side, left external iliac, left obturator group respectively.  Notably, in left external iliac packet, there  was one area of very large and matted nodes.  This was somewhat concerning for locally advanced disease.  Lymphostasis again achieved with cold clips. The left obturator nerve was inspected and found to be uninjured.  Attention was directed at bladder  neck  dissection.  Bladder neck was identified by moving the Foley catheter back and forth, and a lateral release was performed to better denote the bladder neck, prostate caliber, and the structures were separated in anterior and posterior direction  keeping what appeared to be a rim of circular muscle fibers in each plane of dissection.  Posterior dissection was performed by incising approximately 7 mm inferior posterior to the posterior lip of the prostate entering the plane of Denonvilliers.   Bilateral vas deferens were encountered, ligated, and placed on gentle superior traction.  Bilateral seminal vesicles were dissected to their tips and placed on gentle superior traction.  Dissection proceeded inferiorly towards the area of the apex of  the prostate.  This exposed the vascular pedicles on each side, which were controlled using a sequential clipping technique in base to apex orientation performing purposely wide dissection given his large volume aggressive disease.  Final apical  dissection was performed in the anterior plane placing the prostate in superior traction transecting the membranous urethra coldly.  This completely freed up the prostate specimen, which was placed in the EndoCatch bag for later retrieval.  Digital  rectal exam was performed using indicator glove under laparoscopic vision.  No evidence of rectal violation was noted.  The posterior reconstruction was performed using 3-0 V-Loc suture reapproximating the posterior urethral plate to the posterior  bladder neck bringing these  structures in a tension-free apposition and mucosa-to-mucosa anastomosis was performed using double-armed 3-0 V-Loc suture, which revealed an excellent tension-free apposition.  A new Foley catheter was placed, which irrigated  quantitatively.  Sponge and needle counts were correct.  Hemostasis was excellent.  A closed suction drain was brought out through the previous left lateral most robotic port site into the  peritoneal cavity.  The previous right lateral most 12 mm  assistant port site was closed at the level of fascia using a Carter-Thomason suture passer.  The robot was then undocked.  Specimen was retrieved by extending the previous camera port site superiorly for a total distance of approximately 3 cm removing  the prostatectomy specimen and setting aside for permanent pathology.  The area of the umbilicus was carefully palpated and inspected, and again, there was no evidence of true hernia; therefore, a dedicated hernia repair was not performed.  The fascia  there was then closed at the level of fascia using figure-of-eight Novafil x3 followed by reapproximation of Scarpa's with running Vicryl.  All incision sites were infiltrated with dilute lipolyzed  Marcaine and closed at the level of skin using  subcuticular Monocryl and Dermabond.  The procedure was then terminated.  The patient tolerated the procedure well.  No immediate perioperative complications.  The patient was taken to postanesthesia care in stable condition.  Plan for observation admission.  Please note, first assistant, Bari Mantis, was crucial for all portions of the surgery today.  She provided invaluable retraction, suctioning, vascular stapling, lymphatic clipping, and general first assistance.   ROH D: 04/04/2021 2:10:45 pm T: 04/04/2021 5:47:00 pm  JOB: [id number]/ [id number]
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
## 2021-04-06 DIAGNOSIS — I110 Hypertensive heart disease with heart failure: Secondary | ICD-10-CM | POA: Diagnosis not present

## 2021-04-06 DIAGNOSIS — J449 Chronic obstructive pulmonary disease, unspecified: Secondary | ICD-10-CM | POA: Diagnosis not present

## 2021-04-06 DIAGNOSIS — K429 Umbilical hernia without obstruction or gangrene: Secondary | ICD-10-CM | POA: Diagnosis not present

## 2021-04-06 DIAGNOSIS — Z951 Presence of aortocoronary bypass graft: Secondary | ICD-10-CM | POA: Diagnosis not present

## 2021-04-06 DIAGNOSIS — C775 Secondary and unspecified malignant neoplasm of intrapelvic lymph nodes: Secondary | ICD-10-CM | POA: Diagnosis not present

## 2021-04-06 DIAGNOSIS — I2510 Atherosclerotic heart disease of native coronary artery without angina pectoris: Secondary | ICD-10-CM | POA: Diagnosis not present

## 2021-04-06 DIAGNOSIS — E119 Type 2 diabetes mellitus without complications: Secondary | ICD-10-CM | POA: Diagnosis not present

## 2021-04-06 DIAGNOSIS — C61 Malignant neoplasm of prostate: Secondary | ICD-10-CM | POA: Diagnosis not present

## 2021-04-06 DIAGNOSIS — Z20822 Contact with and (suspected) exposure to covid-19: Secondary | ICD-10-CM | POA: Diagnosis not present

## 2021-04-06 LAB — BASIC METABOLIC PANEL
Anion gap: 5 (ref 5–15)
BUN: 27 mg/dL — ABNORMAL HIGH (ref 6–20)
CO2: 28 mmol/L (ref 22–32)
Calcium: 8.8 mg/dL — ABNORMAL LOW (ref 8.9–10.3)
Chloride: 108 mmol/L (ref 98–111)
Creatinine, Ser: 1.2 mg/dL (ref 0.61–1.24)
GFR, Estimated: >60 mL/min (ref >60)
Glucose, Bld: 108 mg/dL — ABNORMAL HIGH (ref 70–99)
Potassium: 4.2 mmol/L (ref 3.5–5.1)
Sodium: 141 mmol/L (ref 135–145)

## 2021-04-06 NOTE — TOC Transition Note (Addendum)
Transition of Care [facility]) - CM/SW Discharge Note   Patient Details  Name: SLAYTON LUBITZ MRN: [id number] Date of Birth: [birth year]  Transition of Care [facility]) CM/SW Contact:  Dessa Phi, RN Phone Number: 04/06/2021, 10:03 AM   Clinical Narrative:  [facility] HHPT rep Tommi Rumps able to accept. [facility] office-spoke to [facility] will f/u with appropriate process for HHPT order, & face to face order prior to d/c. Also informed spouse Jolayne Haines to asst w/HHPT order by calling office.Family has own transport.No further CM needs.    Final next level of care: Home w Home Health Services Barriers to Discharge: No Barriers Identified   Patient Goals and CMS Choice Patient states their goals for this hospitalization and ongoing recovery are:: go home      Discharge Placement                       Discharge Plan and Services   Discharge Planning Services: CM Consult Post Acute Care Choice: Resumption of Svcs/PTA Provider                    HH Arranged: RN [place] Agency: [place] Date [place]: 04/06/21 Time HH Agency Contacted: 1031 Representative spoke with at [place]: [place]  Social Determinants of Health ([place]) Interventions     Readmission Risk Interventions No flowsheet data found.

## 2021-04-06 NOTE — Discharge Summary (Signed)
Physician Discharge Summary  Patient ID: [name] MRN: [id number] DOB/AGE: Jul 15, 1966 55 y.o.  Admit date: 04/04/2021 Discharge date: 04/06/2021  Admission Diagnoses: Prostate Cancer  Discharge Diagnoses:  Active Problems:   Prostate cancer [facility])   Discharged Condition: fair (at baseline)  Hospital Course: Pt underwent robotic prostatectomy with node dissection on 04/04/21, the day of admission, without acute compliateion. He was admitted to the 4th floor Urology service post-op. He is in wheelchair at baseline due to prior CVA. By the AM of 9/23, the day of discharge, he is at functional baseline, Hgb and Cr stable, Tollerating PO nutrition, pain controlled, and felt to be adequate for discharge. Final path pending. JP removed as output scant.     Consults: None  Significant Diagnostic Studies: labs: path pending.   Treatments: surgery: as per above  Discharge Exam: Blood pressure (!) 103/48, pulse 72, temperature 98.3 F (36.8 C), temperature source Oral, resp. rate 20, height '5\' 11"'  (1.803 m), weight 102.9 kg, SpO2 91 %.   General appearance: alert and cooperative Eyes: negative Nose: Nares normal. Septum midline. Mucosa normal. No drainage or sinus tenderness. Throat: lips, mucosa, and tongue normal; teeth and gums normal Neck: supple, symmetrical, trachea midline Back: symmetric, no curvature. ROM normal. No CVA tenderness. Resp: non-labored on room air.  Cardio: Nl rate GI: soft, non-tender; bowel sounds normal; no masses,  no organomegaly and stable obesity. LLQ JP with serosanguinous fluid that is non-foul Male genitalia: normal, foley in place with thin blood tinged urine.  Extremities: extremities normal, atraumatic, no cyanosis or edema and stable subtle wasting LUE and LLE.  Neurologic: Grossly normal Stable stigmata of CVA.   Disposition: HOME   Allergies as of 04/06/2021       Reactions   Contrast Media [iodinated Diagnostic Agents]  Anaphylaxis, Shortness Of Breath, Swelling, Rash   Isovue contrast is most acceptable agent based on previous experience and testing with premedications   Ibuprofen Anaphylaxis, Hives, Swelling   Nsaids Anaphylaxis, Hives, Swelling, Rash   Can take Aspirin 325 mg or lower   Chantix [varenicline]    Nightmares    Other Other (See Comments)   Anti-physcotics Personality change        Medication List     STOP taking these medications    clopidogrel 75 MG tablet Commonly known as: PLAVIX   tamsulosin 0.4 MG Caps capsule Commonly known as: FLOMAX   Vitamin D 50 MCG (2000 UT) tablet       TAKE these medications    acetaminophen 500 MG tablet Commonly known as: TYLENOL Take 1,000 mg by mouth every 6 (six) hours as needed for moderate pain or headache.   albuterol 108 (90 Base) MCG/ACT inhaler Commonly known as: VENTOLIN HFA Inhale 2 puffs into the lungs every 6 (six) hours as needed for wheezing or shortness of breath.   ALPRAZolam 1 MG tablet Commonly known as: XANAX 0.5-1 mg daily as needed for anxiety What changed:  how much to take how to take this when to take this additional instructions   aspirin EC 81 MG tablet Take 1 tablet (81 mg total) by mouth daily.   atorvastatin 40 MG tablet Commonly known as: LIPITOR Take 1 tablet (40 mg total) by mouth daily.   baclofen 20 MG tablet Commonly known as: LIORESAL Take 1 tablet (20 mg total) by mouth 2 (two) times daily.   carvedilol 6.25 MG tablet Commonly known as: COREG Take 6.25 mg by mouth 2 (two) times daily with a  meal.   dapsone 25 MG tablet Take 50 mg by mouth daily.   diltiazem 240 MG 24 hr capsule Commonly known as: CARDIZEM CD Take 240 mg by mouth daily.   docusate sodium 100 MG capsule Commonly known as: COLACE Take 1 capsule (100 mg total) by mouth 2 (two) times daily.   DULoxetine 60 MG capsule Commonly known as: CYMBALTA Take 90 mg daily (60 mg + 30 mg) What changed:  how much to  take how to take this when to take this additional instructions   DULoxetine 30 MG capsule Commonly known as: CYMBALTA Take 90 mg daily (60 mg + 30 mg) What changed:  how much to take how to take this when to take this additional instructions   Entresto 49-51 MG Generic drug: sacubitril-valsartan TAKE (1) TABLET TWICE DAILY.   fluticasone 0.05 % cream Commonly known as: CUTIVATE Apply 1 application topically 2 (two) times daily.   fluticasone 50 MCG/ACT nasal spray Commonly known as: FLONASE Place 1 spray into both nostrils daily as needed for allergies.   furosemide 20 MG tablet Commonly known as: LASIX Take 20 mg by mouth daily.   isosorbide mononitrate 60 MG 24 hr tablet Commonly known as: IMDUR Take 60 mg by mouth 2 (two) times daily.   naloxone 4 MG/0.1ML Liqd nasal spray kit Commonly known as: NARCAN Place 1 spray into the nose as needed (opioid overdose).   nitroGLYCERIN 0.4 MG SL tablet Commonly known as: NITROSTAT Place 0.4 mg under the tongue every 5 (five) minutes as needed. For chest pain   oxyCODONE-acetaminophen 7.5-325 MG tablet Commonly known as: PERCOCET Take 1 tablet by mouth every 6 (six) hours as needed for moderate pain. What changed: when to take this   Ozempic (1 MG/DOSE) 4 MG/3ML Sopn Generic drug: Semaglutide (1 MG/DOSE) Inject 1 mg as directed every Friday.   pregabalin 75 MG capsule Commonly known as: Lyrica Take 1 capsule (75 mg total) by mouth 2 (two) times daily.   silver sulfADIAZINE 1 % cream Commonly known as: SILVADENE Apply 1 application topically 3 (three) times daily.   sulfamethoxazole-trimethoprim 800-160 MG tablet Commonly known as: BACTRIM DS Take 1 tablet by mouth 2 (two) times daily. Start the day prior to foley removal appointment        Follow-up Information     Alexis Frock, MD Follow up on 04/16/2021.   Specialty: Urology Why: at 10:00 Contact information: [place]  [address] [phone]                 Signed: Alexis Frock 04/06/2021, 7:32 AM

## 2021-04-10 LAB — SURGICAL PATHOLOGY

## 2021-04-12 ENCOUNTER — Ambulatory Visit: Payer: Medicare HMO | Admitting: Physical Therapy

## 2021-04-13 DIAGNOSIS — Z23 Encounter for immunization: Secondary | ICD-10-CM | POA: Diagnosis not present

## 2021-04-23 DIAGNOSIS — E1165 Type 2 diabetes mellitus with hyperglycemia: Secondary | ICD-10-CM | POA: Diagnosis not present

## 2021-04-23 DIAGNOSIS — I639 Cerebral infarction, unspecified: Secondary | ICD-10-CM | POA: Diagnosis not present

## 2021-04-23 DIAGNOSIS — Z6831 Body mass index (BMI) 31.0-31.9, adult: Secondary | ICD-10-CM | POA: Diagnosis not present

## 2021-04-23 DIAGNOSIS — F1120 Opioid dependence, uncomplicated: Secondary | ICD-10-CM | POA: Diagnosis not present

## 2021-04-23 DIAGNOSIS — F17200 Nicotine dependence, unspecified, uncomplicated: Secondary | ICD-10-CM | POA: Diagnosis not present

## 2021-04-23 DIAGNOSIS — G894 Chronic pain syndrome: Secondary | ICD-10-CM | POA: Diagnosis not present

## 2021-04-23 DIAGNOSIS — F17210 Nicotine dependence, cigarettes, uncomplicated: Secondary | ICD-10-CM | POA: Diagnosis not present

## 2021-04-23 DIAGNOSIS — Z79899 Other long term (current) drug therapy: Secondary | ICD-10-CM | POA: Diagnosis not present

## 2021-04-24 ENCOUNTER — Other Ambulatory Visit: Payer: Self-pay | Admitting: Cardiology

## 2021-04-25 DIAGNOSIS — Z0000 Encounter for general adult medical examination without abnormal findings: Secondary | ICD-10-CM | POA: Diagnosis not present

## 2021-04-25 DIAGNOSIS — Z1211 Encounter for screening for malignant neoplasm of colon: Secondary | ICD-10-CM | POA: Diagnosis not present

## 2021-04-25 DIAGNOSIS — R5383 Other fatigue: Secondary | ICD-10-CM | POA: Diagnosis not present

## 2021-04-25 DIAGNOSIS — E7800 Pure hypercholesterolemia, unspecified: Secondary | ICD-10-CM | POA: Diagnosis not present

## 2021-04-25 DIAGNOSIS — M129 Arthropathy, unspecified: Secondary | ICD-10-CM | POA: Diagnosis not present

## 2021-04-25 DIAGNOSIS — F17210 Nicotine dependence, cigarettes, uncomplicated: Secondary | ICD-10-CM | POA: Diagnosis not present

## 2021-04-25 DIAGNOSIS — Z79899 Other long term (current) drug therapy: Secondary | ICD-10-CM | POA: Diagnosis not present

## 2021-04-25 DIAGNOSIS — E559 Vitamin D deficiency, unspecified: Secondary | ICD-10-CM | POA: Diagnosis not present

## 2021-04-25 DIAGNOSIS — E1165 Type 2 diabetes mellitus with hyperglycemia: Secondary | ICD-10-CM | POA: Diagnosis not present

## 2021-04-27 ENCOUNTER — Encounter: Payer: Medicare HMO | Attending: Physical Medicine & Rehabilitation | Admitting: Physical Medicine & Rehabilitation

## 2021-04-27 ENCOUNTER — Encounter: Payer: Self-pay | Admitting: Physical Medicine & Rehabilitation

## 2021-04-27 ENCOUNTER — Other Ambulatory Visit: Payer: Self-pay

## 2021-04-27 VITALS — BP 95/51 | HR 83 | Temp 98.5°F | Ht 71.0 in | Wt 223.0 lb

## 2021-04-27 DIAGNOSIS — G8110 Spastic hemiplegia affecting unspecified side: Secondary | ICD-10-CM | POA: Diagnosis not present

## 2021-04-27 DIAGNOSIS — G8114 Spastic hemiplegia affecting left nondominant side: Secondary | ICD-10-CM

## 2021-04-27 NOTE — Progress Notes (Signed)
Dysport Injection for spasticity using needle EMG guidance  Dilution: 200 Units/ml Indication: Severe spasticity which interferes with ADL,mobility and/or  hygiene and is unresponsive to medication management and other conservative care Informed consent was obtained after describing risks and benefits of the procedure with the patient. This includes bleeding, bruising, infection, excessive weakness, or medication side effects. A REMS form is on file and signed. Needle:  needle electrode Number of units per muscle LUE FDS 200-  FDP 200-   Biceps 200      Trunk Left pec  100U    LLE   Med  gastroc 200U Med hamstrings 100U- consider increasing next injection Tibialis post  300U- consider decreasing next visit   Soleus  200U All injections were done after obtaining appropriate EMG activity and after negative drawback for blood. The patient tolerated the procedure well. Post procedure instructions were given. A followup appointment was made.

## 2021-04-27 NOTE — Patient Instructions (Signed)

## 2021-05-06 DIAGNOSIS — S301XXA Contusion of abdominal wall, initial encounter: Secondary | ICD-10-CM | POA: Diagnosis not present

## 2021-05-08 ENCOUNTER — Ambulatory Visit: Payer: Medicare HMO | Admitting: Physical Medicine & Rehabilitation

## 2021-05-14 DIAGNOSIS — E782 Mixed hyperlipidemia: Secondary | ICD-10-CM | POA: Diagnosis not present

## 2021-05-14 DIAGNOSIS — Z1329 Encounter for screening for other suspected endocrine disorder: Secondary | ICD-10-CM | POA: Diagnosis not present

## 2021-05-14 DIAGNOSIS — E8881 Metabolic syndrome: Secondary | ICD-10-CM | POA: Diagnosis not present

## 2021-05-14 DIAGNOSIS — K2100 Gastro-esophageal reflux disease with esophagitis, without bleeding: Secondary | ICD-10-CM | POA: Diagnosis not present

## 2021-05-14 DIAGNOSIS — I10 Essential (primary) hypertension: Secondary | ICD-10-CM | POA: Diagnosis not present

## 2021-05-14 DIAGNOSIS — Z1159 Encounter for screening for other viral diseases: Secondary | ICD-10-CM | POA: Diagnosis not present

## 2021-05-14 DIAGNOSIS — E119 Type 2 diabetes mellitus without complications: Secondary | ICD-10-CM | POA: Diagnosis not present

## 2021-05-14 DIAGNOSIS — N184 Chronic kidney disease, stage 4 (severe): Secondary | ICD-10-CM | POA: Diagnosis not present

## 2021-05-14 DIAGNOSIS — E7849 Other hyperlipidemia: Secondary | ICD-10-CM | POA: Diagnosis not present

## 2021-05-17 DIAGNOSIS — I25119 Atherosclerotic heart disease of native coronary artery with unspecified angina pectoris: Secondary | ICD-10-CM | POA: Diagnosis not present

## 2021-05-17 DIAGNOSIS — G8102 Flaccid hemiplegia affecting left dominant side: Secondary | ICD-10-CM | POA: Diagnosis not present

## 2021-05-17 DIAGNOSIS — E119 Type 2 diabetes mellitus without complications: Secondary | ICD-10-CM | POA: Diagnosis not present

## 2021-05-17 DIAGNOSIS — I10 Essential (primary) hypertension: Secondary | ICD-10-CM | POA: Diagnosis not present

## 2021-05-17 DIAGNOSIS — J4530 Mild persistent asthma, uncomplicated: Secondary | ICD-10-CM | POA: Diagnosis not present

## 2021-05-17 DIAGNOSIS — N1831 Chronic kidney disease, stage 3a: Secondary | ICD-10-CM | POA: Diagnosis not present

## 2021-05-17 DIAGNOSIS — Z23 Encounter for immunization: Secondary | ICD-10-CM | POA: Diagnosis not present

## 2021-05-17 DIAGNOSIS — Z0001 Encounter for general adult medical examination with abnormal findings: Secondary | ICD-10-CM | POA: Diagnosis not present

## 2021-05-22 DIAGNOSIS — R278 Other lack of coordination: Secondary | ICD-10-CM | POA: Diagnosis not present

## 2021-05-22 DIAGNOSIS — N3945 Continuous leakage: Secondary | ICD-10-CM | POA: Diagnosis not present

## 2021-05-22 DIAGNOSIS — M6281 Muscle weakness (generalized): Secondary | ICD-10-CM | POA: Diagnosis not present

## 2021-05-22 DIAGNOSIS — R350 Frequency of micturition: Secondary | ICD-10-CM | POA: Diagnosis not present
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
## 2021-05-24 DIAGNOSIS — E7800 Pure hypercholesterolemia, unspecified: Secondary | ICD-10-CM | POA: Diagnosis not present

## 2021-05-24 DIAGNOSIS — E559 Vitamin D deficiency, unspecified: Secondary | ICD-10-CM | POA: Diagnosis not present

## 2021-05-24 DIAGNOSIS — I639 Cerebral infarction, unspecified: Secondary | ICD-10-CM | POA: Diagnosis not present

## 2021-05-24 DIAGNOSIS — I10 Essential (primary) hypertension: Secondary | ICD-10-CM | POA: Diagnosis not present

## 2021-05-25 DIAGNOSIS — M545 Low back pain, unspecified: Secondary | ICD-10-CM | POA: Diagnosis not present

## 2021-05-25 DIAGNOSIS — F17210 Nicotine dependence, cigarettes, uncomplicated: Secondary | ICD-10-CM | POA: Diagnosis not present

## 2021-05-25 DIAGNOSIS — M79662 Pain in left lower leg: Secondary | ICD-10-CM | POA: Diagnosis not present

## 2021-05-25 DIAGNOSIS — Z79899 Other long term (current) drug therapy: Secondary | ICD-10-CM | POA: Diagnosis not present

## 2021-05-25 DIAGNOSIS — Z6830 Body mass index (BMI) 30.0-30.9, adult: Secondary | ICD-10-CM | POA: Diagnosis not present

## 2021-05-25 DIAGNOSIS — G894 Chronic pain syndrome: Secondary | ICD-10-CM | POA: Diagnosis not present

## 2021-05-28 ENCOUNTER — Ambulatory Visit (INDEPENDENT_AMBULATORY_CARE_PROVIDER_SITE_OTHER): Payer: Medicare HMO | Admitting: Psychiatry

## 2021-05-28 ENCOUNTER — Other Ambulatory Visit: Payer: Self-pay

## 2021-05-28 ENCOUNTER — Encounter: Payer: Self-pay | Admitting: Psychiatry

## 2021-05-28 VITALS — BP 87/60 | HR 72 | Temp 97.8°F | Wt 219.0 lb

## 2021-05-28 DIAGNOSIS — F330 Major depressive disorder, recurrent, mild: Secondary | ICD-10-CM | POA: Diagnosis not present

## 2021-05-28 NOTE — Patient Instructions (Signed)
1. Continue duloxetine 90 mg (60 mg + 30 mg) daily  2. Next appointment:  2/14 at 1:20

## 2021-05-29 DIAGNOSIS — N3945 Continuous leakage: Secondary | ICD-10-CM | POA: Diagnosis not present

## 2021-05-29 DIAGNOSIS — N3000 Acute cystitis without hematuria: Secondary | ICD-10-CM | POA: Diagnosis not present

## 2021-05-30 DIAGNOSIS — Z79899 Other long term (current) drug therapy: Secondary | ICD-10-CM | POA: Diagnosis not present

## 2021-06-05 DIAGNOSIS — Z7902 Long term (current) use of antithrombotics/antiplatelets: Secondary | ICD-10-CM | POA: Diagnosis not present

## 2021-06-05 DIAGNOSIS — E119 Type 2 diabetes mellitus without complications: Secondary | ICD-10-CM | POA: Diagnosis not present

## 2021-06-05 DIAGNOSIS — R454 Irritability and anger: Secondary | ICD-10-CM | POA: Diagnosis not present

## 2021-06-05 DIAGNOSIS — R45851 Suicidal ideations: Secondary | ICD-10-CM | POA: Diagnosis not present

## 2021-06-05 DIAGNOSIS — J439 Emphysema, unspecified: Secondary | ICD-10-CM | POA: Diagnosis not present

## 2021-06-05 DIAGNOSIS — R9431 Abnormal electrocardiogram [ECG] [EKG]: Secondary | ICD-10-CM | POA: Diagnosis not present

## 2021-06-05 DIAGNOSIS — I110 Hypertensive heart disease with heart failure: Secondary | ICD-10-CM | POA: Diagnosis not present

## 2021-06-05 DIAGNOSIS — R059 Cough, unspecified: Secondary | ICD-10-CM | POA: Diagnosis not present

## 2021-06-05 DIAGNOSIS — R079 Chest pain, unspecified: Secondary | ICD-10-CM | POA: Diagnosis not present

## 2021-06-05 DIAGNOSIS — R0602 Shortness of breath: Secondary | ICD-10-CM | POA: Diagnosis not present

## 2021-06-05 DIAGNOSIS — I959 Hypotension, unspecified: Secondary | ICD-10-CM | POA: Diagnosis not present

## 2021-06-05 DIAGNOSIS — I700 Atherosclerosis of aorta: Secondary | ICD-10-CM | POA: Diagnosis not present

## 2021-06-05 DIAGNOSIS — J449 Chronic obstructive pulmonary disease, unspecified: Secondary | ICD-10-CM | POA: Diagnosis not present

## 2021-06-05 DIAGNOSIS — F4321 Adjustment disorder with depressed mood: Secondary | ICD-10-CM | POA: Diagnosis not present

## 2021-06-05 DIAGNOSIS — I509 Heart failure, unspecified: Secondary | ICD-10-CM | POA: Diagnosis not present

## 2021-06-05 DIAGNOSIS — F329 Major depressive disorder, single episode, unspecified: Secondary | ICD-10-CM | POA: Diagnosis not present

## 2021-06-05 DIAGNOSIS — J811 Chronic pulmonary edema: Secondary | ICD-10-CM | POA: Diagnosis not present

## 2021-06-05 DIAGNOSIS — C61 Malignant neoplasm of prostate: Secondary | ICD-10-CM | POA: Diagnosis not present

## 2021-06-05 DIAGNOSIS — E785 Hyperlipidemia, unspecified: Secondary | ICD-10-CM | POA: Diagnosis not present

## 2021-06-05 DIAGNOSIS — J90 Pleural effusion, not elsewhere classified: Secondary | ICD-10-CM | POA: Diagnosis not present

## 2021-06-05 DIAGNOSIS — R911 Solitary pulmonary nodule: Secondary | ICD-10-CM | POA: Diagnosis not present

## 2021-06-05 DIAGNOSIS — I517 Cardiomegaly: Secondary | ICD-10-CM | POA: Diagnosis not present

## 2021-06-07 DIAGNOSIS — R059 Cough, unspecified: Secondary | ICD-10-CM | POA: Diagnosis not present

## 2021-06-08 DIAGNOSIS — J811 Chronic pulmonary edema: Secondary | ICD-10-CM | POA: Diagnosis not present

## 2021-06-08 DIAGNOSIS — I517 Cardiomegaly: Secondary | ICD-10-CM | POA: Diagnosis not present

## 2021-06-09 DIAGNOSIS — R079 Chest pain, unspecified: Secondary | ICD-10-CM | POA: Diagnosis not present

## 2021-06-09 DIAGNOSIS — J439 Emphysema, unspecified: Secondary | ICD-10-CM | POA: Diagnosis not present

## 2021-06-09 DIAGNOSIS — I517 Cardiomegaly: Secondary | ICD-10-CM | POA: Diagnosis not present

## 2021-06-09 DIAGNOSIS — J90 Pleural effusion, not elsewhere classified: Secondary | ICD-10-CM | POA: Diagnosis not present

## 2021-06-09 DIAGNOSIS — R9431 Abnormal electrocardiogram [ECG] [EKG]: Secondary | ICD-10-CM | POA: Diagnosis not present

## 2021-06-09 DIAGNOSIS — I700 Atherosclerosis of aorta: Secondary | ICD-10-CM | POA: Diagnosis not present

## 2021-06-09 DIAGNOSIS — R911 Solitary pulmonary nodule: Secondary | ICD-10-CM | POA: Diagnosis not present

## 2021-06-09 DIAGNOSIS — R0602 Shortness of breath: Secondary | ICD-10-CM | POA: Diagnosis not present

## 2021-06-09 DIAGNOSIS — J811 Chronic pulmonary edema: Secondary | ICD-10-CM | POA: Diagnosis not present

## 2021-06-10 DIAGNOSIS — I255 Ischemic cardiomyopathy: Secondary | ICD-10-CM | POA: Diagnosis not present

## 2021-06-10 DIAGNOSIS — Z8679 Personal history of other diseases of the circulatory system: Secondary | ICD-10-CM | POA: Diagnosis not present

## 2021-06-10 DIAGNOSIS — I130 Hypertensive heart and chronic kidney disease with heart failure and stage 1 through stage 4 chronic kidney disease, or unspecified chronic kidney disease: Secondary | ICD-10-CM | POA: Diagnosis not present

## 2021-06-10 DIAGNOSIS — I209 Angina pectoris, unspecified: Secondary | ICD-10-CM | POA: Diagnosis not present

## 2021-06-10 DIAGNOSIS — I080 Rheumatic disorders of both mitral and aortic valves: Secondary | ICD-10-CM | POA: Diagnosis not present

## 2021-06-10 DIAGNOSIS — R531 Weakness: Secondary | ICD-10-CM | POA: Diagnosis not present

## 2021-06-10 DIAGNOSIS — R9431 Abnormal electrocardiogram [ECG] [EKG]: Secondary | ICD-10-CM | POA: Diagnosis not present

## 2021-06-10 DIAGNOSIS — I69354 Hemiplegia and hemiparesis following cerebral infarction affecting left non-dominant side: Secondary | ICD-10-CM | POA: Diagnosis not present

## 2021-06-10 DIAGNOSIS — N189 Chronic kidney disease, unspecified: Secondary | ICD-10-CM | POA: Diagnosis not present

## 2021-06-10 DIAGNOSIS — R45851 Suicidal ideations: Secondary | ICD-10-CM | POA: Diagnosis not present

## 2021-06-10 DIAGNOSIS — I5022 Chronic systolic (congestive) heart failure: Secondary | ICD-10-CM | POA: Diagnosis not present

## 2021-06-10 DIAGNOSIS — F1390 Sedative, hypnotic, or anxiolytic use, unspecified, uncomplicated: Secondary | ICD-10-CM | POA: Diagnosis not present

## 2021-06-10 DIAGNOSIS — I200 Unstable angina: Secondary | ICD-10-CM | POA: Diagnosis not present

## 2021-06-10 DIAGNOSIS — Z743 Need for continuous supervision: Secondary | ICD-10-CM | POA: Diagnosis not present

## 2021-06-10 DIAGNOSIS — F331 Major depressive disorder, recurrent, moderate: Secondary | ICD-10-CM | POA: Diagnosis not present

## 2021-06-10 DIAGNOSIS — E1122 Type 2 diabetes mellitus with diabetic chronic kidney disease: Secondary | ICD-10-CM | POA: Diagnosis not present

## 2021-06-10 DIAGNOSIS — I25110 Atherosclerotic heart disease of native coronary artery with unstable angina pectoris: Secondary | ICD-10-CM | POA: Diagnosis not present

## 2021-06-10 DIAGNOSIS — N179 Acute kidney failure, unspecified: Secondary | ICD-10-CM | POA: Diagnosis not present

## 2021-06-11 DIAGNOSIS — I69354 Hemiplegia and hemiparesis following cerebral infarction affecting left non-dominant side: Secondary | ICD-10-CM | POA: Diagnosis not present

## 2021-06-11 DIAGNOSIS — R451 Restlessness and agitation: Secondary | ICD-10-CM | POA: Diagnosis not present

## 2021-06-11 DIAGNOSIS — E1122 Type 2 diabetes mellitus with diabetic chronic kidney disease: Secondary | ICD-10-CM | POA: Diagnosis not present

## 2021-06-11 DIAGNOSIS — F32A Depression, unspecified: Secondary | ICD-10-CM | POA: Diagnosis not present

## 2021-06-11 DIAGNOSIS — R45851 Suicidal ideations: Secondary | ICD-10-CM | POA: Diagnosis not present

## 2021-06-11 DIAGNOSIS — I2510 Atherosclerotic heart disease of native coronary artery without angina pectoris: Secondary | ICD-10-CM | POA: Diagnosis not present

## 2021-06-11 DIAGNOSIS — F419 Anxiety disorder, unspecified: Secondary | ICD-10-CM | POA: Diagnosis not present

## 2021-06-11 DIAGNOSIS — N189 Chronic kidney disease, unspecified: Secondary | ICD-10-CM | POA: Diagnosis not present

## 2021-06-11 DIAGNOSIS — J441 Chronic obstructive pulmonary disease with (acute) exacerbation: Secondary | ICD-10-CM | POA: Diagnosis not present

## 2021-06-11 DIAGNOSIS — N179 Acute kidney failure, unspecified: Secondary | ICD-10-CM | POA: Diagnosis not present

## 2021-06-11 DIAGNOSIS — F1390 Sedative, hypnotic, or anxiolytic use, unspecified, uncomplicated: Secondary | ICD-10-CM | POA: Diagnosis not present

## 2021-06-11 DIAGNOSIS — I130 Hypertensive heart and chronic kidney disease with heart failure and stage 1 through stage 4 chronic kidney disease, or unspecified chronic kidney disease: Secondary | ICD-10-CM | POA: Diagnosis not present

## 2021-06-11 DIAGNOSIS — F331 Major depressive disorder, recurrent, moderate: Secondary | ICD-10-CM | POA: Diagnosis not present

## 2021-06-11 DIAGNOSIS — I25110 Atherosclerotic heart disease of native coronary artery with unstable angina pectoris: Secondary | ICD-10-CM | POA: Diagnosis not present

## 2021-06-11 DIAGNOSIS — I5022 Chronic systolic (congestive) heart failure: Secondary | ICD-10-CM | POA: Diagnosis not present

## 2021-06-11 DIAGNOSIS — I255 Ischemic cardiomyopathy: Secondary | ICD-10-CM | POA: Diagnosis not present

## 2021-06-12 DIAGNOSIS — I69354 Hemiplegia and hemiparesis following cerebral infarction affecting left non-dominant side: Secondary | ICD-10-CM | POA: Diagnosis not present

## 2021-06-12 DIAGNOSIS — R451 Restlessness and agitation: Secondary | ICD-10-CM | POA: Diagnosis not present

## 2021-06-12 DIAGNOSIS — E1122 Type 2 diabetes mellitus with diabetic chronic kidney disease: Secondary | ICD-10-CM | POA: Diagnosis not present

## 2021-06-12 DIAGNOSIS — I5022 Chronic systolic (congestive) heart failure: Secondary | ICD-10-CM | POA: Diagnosis not present

## 2021-06-12 DIAGNOSIS — I2510 Atherosclerotic heart disease of native coronary artery without angina pectoris: Secondary | ICD-10-CM | POA: Diagnosis not present

## 2021-06-12 DIAGNOSIS — F331 Major depressive disorder, recurrent, moderate: Secondary | ICD-10-CM | POA: Diagnosis not present

## 2021-06-12 DIAGNOSIS — N189 Chronic kidney disease, unspecified: Secondary | ICD-10-CM | POA: Diagnosis not present

## 2021-06-12 DIAGNOSIS — Z79899 Other long term (current) drug therapy: Secondary | ICD-10-CM | POA: Diagnosis not present

## 2021-06-12 DIAGNOSIS — J441 Chronic obstructive pulmonary disease with (acute) exacerbation: Secondary | ICD-10-CM | POA: Diagnosis not present

## 2021-06-12 DIAGNOSIS — I255 Ischemic cardiomyopathy: Secondary | ICD-10-CM | POA: Diagnosis not present

## 2021-06-12 DIAGNOSIS — R45851 Suicidal ideations: Secondary | ICD-10-CM | POA: Diagnosis not present

## 2021-06-12 DIAGNOSIS — F419 Anxiety disorder, unspecified: Secondary | ICD-10-CM | POA: Diagnosis not present

## 2021-06-12 DIAGNOSIS — I25110 Atherosclerotic heart disease of native coronary artery with unstable angina pectoris: Secondary | ICD-10-CM | POA: Diagnosis not present

## 2021-06-12 DIAGNOSIS — I130 Hypertensive heart and chronic kidney disease with heart failure and stage 1 through stage 4 chronic kidney disease, or unspecified chronic kidney disease: Secondary | ICD-10-CM | POA: Diagnosis not present

## 2021-06-12 DIAGNOSIS — N179 Acute kidney failure, unspecified: Secondary | ICD-10-CM | POA: Diagnosis not present

## 2021-06-13 DIAGNOSIS — I255 Ischemic cardiomyopathy: Secondary | ICD-10-CM | POA: Diagnosis not present

## 2021-06-13 DIAGNOSIS — I25110 Atherosclerotic heart disease of native coronary artery with unstable angina pectoris: Secondary | ICD-10-CM | POA: Diagnosis not present

## 2021-06-13 DIAGNOSIS — F331 Major depressive disorder, recurrent, moderate: Secondary | ICD-10-CM | POA: Diagnosis not present

## 2021-06-13 DIAGNOSIS — I130 Hypertensive heart and chronic kidney disease with heart failure and stage 1 through stage 4 chronic kidney disease, or unspecified chronic kidney disease: Secondary | ICD-10-CM | POA: Diagnosis not present

## 2021-06-13 DIAGNOSIS — I69354 Hemiplegia and hemiparesis following cerebral infarction affecting left non-dominant side: Secondary | ICD-10-CM | POA: Diagnosis not present

## 2021-06-13 DIAGNOSIS — J441 Chronic obstructive pulmonary disease with (acute) exacerbation: Secondary | ICD-10-CM | POA: Diagnosis not present

## 2021-06-13 DIAGNOSIS — E1122 Type 2 diabetes mellitus with diabetic chronic kidney disease: Secondary | ICD-10-CM | POA: Diagnosis not present

## 2021-06-13 DIAGNOSIS — I2510 Atherosclerotic heart disease of native coronary artery without angina pectoris: Secondary | ICD-10-CM | POA: Diagnosis not present

## 2021-06-13 DIAGNOSIS — N189 Chronic kidney disease, unspecified: Secondary | ICD-10-CM | POA: Diagnosis not present

## 2021-06-13 DIAGNOSIS — N179 Acute kidney failure, unspecified: Secondary | ICD-10-CM | POA: Diagnosis not present

## 2021-06-13 DIAGNOSIS — R45851 Suicidal ideations: Secondary | ICD-10-CM | POA: Diagnosis not present

## 2021-06-13 DIAGNOSIS — I5022 Chronic systolic (congestive) heart failure: Secondary | ICD-10-CM | POA: Diagnosis not present

## 2021-06-14 ENCOUNTER — Telehealth: Payer: Self-pay | Admitting: Psychiatry

## 2021-06-14 DIAGNOSIS — I255 Ischemic cardiomyopathy: Secondary | ICD-10-CM | POA: Diagnosis not present

## 2021-06-14 DIAGNOSIS — F1390 Sedative, hypnotic, or anxiolytic use, unspecified, uncomplicated: Secondary | ICD-10-CM | POA: Diagnosis not present

## 2021-06-14 DIAGNOSIS — F419 Anxiety disorder, unspecified: Secondary | ICD-10-CM | POA: Diagnosis not present

## 2021-06-14 DIAGNOSIS — R45851 Suicidal ideations: Secondary | ICD-10-CM | POA: Diagnosis not present

## 2021-06-14 DIAGNOSIS — N179 Acute kidney failure, unspecified: Secondary | ICD-10-CM | POA: Diagnosis not present

## 2021-06-14 DIAGNOSIS — R451 Restlessness and agitation: Secondary | ICD-10-CM | POA: Diagnosis not present

## 2021-06-14 DIAGNOSIS — I5022 Chronic systolic (congestive) heart failure: Secondary | ICD-10-CM | POA: Diagnosis not present

## 2021-06-14 DIAGNOSIS — N189 Chronic kidney disease, unspecified: Secondary | ICD-10-CM | POA: Diagnosis not present

## 2021-06-14 DIAGNOSIS — I25110 Atherosclerotic heart disease of native coronary artery with unstable angina pectoris: Secondary | ICD-10-CM | POA: Diagnosis not present

## 2021-06-14 DIAGNOSIS — Z9151 Personal history of suicidal behavior: Secondary | ICD-10-CM | POA: Diagnosis not present

## 2021-06-14 DIAGNOSIS — J441 Chronic obstructive pulmonary disease with (acute) exacerbation: Secondary | ICD-10-CM | POA: Diagnosis not present

## 2021-06-14 DIAGNOSIS — E1122 Type 2 diabetes mellitus with diabetic chronic kidney disease: Secondary | ICD-10-CM | POA: Diagnosis not present

## 2021-06-14 DIAGNOSIS — F331 Major depressive disorder, recurrent, moderate: Secondary | ICD-10-CM | POA: Diagnosis not present

## 2021-06-14 DIAGNOSIS — I130 Hypertensive heart and chronic kidney disease with heart failure and stage 1 through stage 4 chronic kidney disease, or unspecified chronic kidney disease: Secondary | ICD-10-CM | POA: Diagnosis not present

## 2021-06-14 DIAGNOSIS — I69354 Hemiplegia and hemiparesis following cerebral infarction affecting left non-dominant side: Secondary | ICD-10-CM | POA: Diagnosis not present

## 2021-06-14 NOTE — Telephone Encounter (Addendum)
Received a notification from the staff that Dr. Delman Cheadle at [facility] contacted [place] to speak about this mutual patient. [phone] Discussed on 12/2. He was admitted due to [place], and he was evaluated for chest pain, and is ruled out for NSTEMI, and is medically stable. He reportedly contact his daughter, stating that he wishes to have completed suicide during this admission. He is unable to go to psych ward due to his ADL. They will reach out to geropsych unit. Informed Dr. Delman Cheadle that this writer would appreciate further search on inpatient treatment for stabilization given his recent SI comment/significant psychosocial stressors at home. Also informed of resources at [facility] including PHP/IOP/individual therapy if he were to be interested in the future. He is on duloxetine 90 mg daily during the admission. Dr. Delman Cheadle to contact nurse line at [facility] with this clinician if needed.

## 2021-06-15 ENCOUNTER — Telehealth: Payer: Self-pay

## 2021-06-15 DIAGNOSIS — I25110 Atherosclerotic heart disease of native coronary artery with unstable angina pectoris: Secondary | ICD-10-CM | POA: Diagnosis not present

## 2021-06-15 DIAGNOSIS — I5022 Chronic systolic (congestive) heart failure: Secondary | ICD-10-CM | POA: Diagnosis not present

## 2021-06-15 DIAGNOSIS — F331 Major depressive disorder, recurrent, moderate: Secondary | ICD-10-CM | POA: Diagnosis not present

## 2021-06-15 DIAGNOSIS — N189 Chronic kidney disease, unspecified: Secondary | ICD-10-CM | POA: Diagnosis not present

## 2021-06-15 DIAGNOSIS — E119 Type 2 diabetes mellitus without complications: Secondary | ICD-10-CM | POA: Diagnosis not present

## 2021-06-15 DIAGNOSIS — I130 Hypertensive heart and chronic kidney disease with heart failure and stage 1 through stage 4 chronic kidney disease, or unspecified chronic kidney disease: Secondary | ICD-10-CM | POA: Diagnosis not present

## 2021-06-15 DIAGNOSIS — F329 Major depressive disorder, single episode, unspecified: Secondary | ICD-10-CM | POA: Diagnosis not present

## 2021-06-15 DIAGNOSIS — R45851 Suicidal ideations: Secondary | ICD-10-CM | POA: Diagnosis not present

## 2021-06-15 DIAGNOSIS — I69354 Hemiplegia and hemiparesis following cerebral infarction affecting left non-dominant side: Secondary | ICD-10-CM | POA: Diagnosis not present

## 2021-06-15 DIAGNOSIS — I2510 Atherosclerotic heart disease of native coronary artery without angina pectoris: Secondary | ICD-10-CM | POA: Diagnosis not present

## 2021-06-15 DIAGNOSIS — N179 Acute kidney failure, unspecified: Secondary | ICD-10-CM | POA: Diagnosis not present

## 2021-06-15 DIAGNOSIS — E1122 Type 2 diabetes mellitus with diabetic chronic kidney disease: Secondary | ICD-10-CM | POA: Diagnosis not present

## 2021-06-15 DIAGNOSIS — Z79899 Other long term (current) drug therapy: Secondary | ICD-10-CM | POA: Diagnosis not present

## 2021-06-15 NOTE — Telephone Encounter (Signed)
Noted, thanks. Please inform the patient that I did speak with Dr. Bayard Males this morning. (Release will be voided given there was imminent concern about his status, and I obtained information from this provider rather than releasing the information to him, fyi)

## 2021-06-15 NOTE — Telephone Encounter (Signed)
pt called states that he is at [facility] states that he tried to hurt himself a few weeks ago and he wants you to speak to dr. Bayard Males his room number is [phone] I told him that we need him to sign a release that we can speak with Dr. Bayard Males.

## 2021-06-18 NOTE — Telephone Encounter (Signed)
pt is out of the hospital and he has an appt for 12-8th
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
## 2021-06-21 ENCOUNTER — Ambulatory Visit (INDEPENDENT_AMBULATORY_CARE_PROVIDER_SITE_OTHER): Payer: Medicare HMO | Admitting: Psychiatry

## 2021-06-21 ENCOUNTER — Encounter: Payer: Medicare HMO | Admitting: Physical Medicine & Rehabilitation

## 2021-06-21 ENCOUNTER — Other Ambulatory Visit: Payer: Self-pay

## 2021-06-21 ENCOUNTER — Encounter: Payer: Self-pay | Admitting: Psychiatry

## 2021-06-21 VITALS — BP 101/63 | HR 81 | Temp 97.9°F

## 2021-06-21 DIAGNOSIS — F3341 Major depressive disorder, recurrent, in partial remission: Secondary | ICD-10-CM

## 2021-06-21 DIAGNOSIS — F419 Anxiety disorder, unspecified: Secondary | ICD-10-CM

## 2021-06-21 MED ORDER — DULOXETINE HCL 60 MG PO CPEP: 60.0000 mg | ORAL_CAPSULE | Freq: Every day | ORAL | 0 refills | Status: DC

## 2021-06-21 NOTE — Patient Instructions (Addendum)
1. Restart duloxetine 60 mg daily  2. Next appointment:  2/14 at 1:20, video

## 2021-06-23 ENCOUNTER — Other Ambulatory Visit: Payer: Self-pay | Admitting: Cardiology

## 2021-06-25 DIAGNOSIS — M545 Low back pain, unspecified: Secondary | ICD-10-CM | POA: Diagnosis not present

## 2021-06-25 DIAGNOSIS — E1165 Type 2 diabetes mellitus with hyperglycemia: Secondary | ICD-10-CM | POA: Diagnosis not present

## 2021-06-25 DIAGNOSIS — F17210 Nicotine dependence, cigarettes, uncomplicated: Secondary | ICD-10-CM | POA: Diagnosis not present

## 2021-06-25 DIAGNOSIS — Z6830 Body mass index (BMI) 30.0-30.9, adult: Secondary | ICD-10-CM | POA: Diagnosis not present

## 2021-06-25 DIAGNOSIS — Z79899 Other long term (current) drug therapy: Secondary | ICD-10-CM | POA: Diagnosis not present

## 2021-06-25 DIAGNOSIS — M79662 Pain in left lower leg: Secondary | ICD-10-CM | POA: Diagnosis not present

## 2021-06-25 DIAGNOSIS — G894 Chronic pain syndrome: Secondary | ICD-10-CM | POA: Diagnosis not present

## 2021-06-26 ENCOUNTER — Encounter: Payer: Self-pay | Admitting: Physical Medicine & Rehabilitation

## 2021-06-26 ENCOUNTER — Other Ambulatory Visit: Payer: Self-pay

## 2021-06-26 ENCOUNTER — Encounter: Payer: Medicare HMO | Attending: Physical Medicine & Rehabilitation | Admitting: Physical Medicine & Rehabilitation

## 2021-06-26 VITALS — BP 99/67 | HR 90 | Temp 98.6°F | Ht 71.0 in | Wt 219.0 lb

## 2021-06-26 DIAGNOSIS — M24532 Contracture, left wrist: Secondary | ICD-10-CM | POA: Diagnosis not present

## 2021-06-26 DIAGNOSIS — G8110 Spastic hemiplegia affecting unspecified side: Secondary | ICD-10-CM | POA: Diagnosis not present

## 2021-06-26 DIAGNOSIS — M24572 Contracture, left ankle: Secondary | ICD-10-CM | POA: Diagnosis not present

## 2021-06-26 NOTE — Progress Notes (Signed)
Subjective:    Patient ID: [name], male    DOB: [birth year], 55 y.o.   MRN: [id number]  HPI  Left spastic hemiparesis due to Right CVA  04/27/2021 LUE FDS 200-  FDP 200-   Biceps 200      Trunk Left pec  100U    LLE   Med  gastroc 200U Med hamstrings 100U- consider increasing next injection Tibialis post  300U- consider decreasing next visit   Soleus  200U Pain Inventory Average Pain 6 Pain Right Now 6 My pain is  left arm  In the last 24 hours, has pain interfered with the following? General activity 0 Relation with others 4 Enjoyment of life 8 What TIME of day is your pain at its worst? varies Sleep (in general) Poor  Pain is worse with: some activites Pain improves with: medication Relief from Meds: 4  Family History  Problem Relation Age of Onset   Lung cancer Father    Heart disease Father    Heart disease Mother    Congestive Heart Failure Mother    Diabetes Mother    Hyperlipidemia Mother    Alcohol abuse Brother    Breast cancer Maternal Aunt    Suicidality Cousin    Social History   Socioeconomic History   Marital status: Legally Separated    Spouse name: Jolayne Haines   Number of children: 2   Years of education: Not on file   Highest education level: 12th grade  Occupational History   Occupation: disabled  Tobacco Use   Smoking status: Every Day    Packs/day: 1.00    Years: 30.00    Pack years: 30.00    Types: Cigarettes    Start date: 06/08/1982   Smokeless tobacco: Never   Tobacco comments:    1 pack per day 03/04/2018  Vaping Use   Vaping Use: Never used  Substance and Sexual Activity   Alcohol use: Yes    Comment: occassionally   Drug use: No   Sexual activity: Yes    Birth control/protection: None  Other Topics Concern   Not on file  Social History Narrative   Disabled since age 40 lives with wife and children      Patient is right-handed. He is married, lives in a 1 story house, has a ramp to enter. Drinks  64oz of Mtn. Dew a day. Prior to CVA was walking daily. 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      Social Determinants of Health   Financial Resource Strain: Not on file  Food Insecurity: Not on file  Transportation Needs: Not on file  Physical Activity: Not on file  Stress: Not on file  Social Connections: Not on file   Past Surgical History:  Procedure Laterality Date   CHOLECYSTECTOMY     CORONARY ARTERY BYPASS GRAFT  2010   LIMA to LAD, SVG to diagonal, SVG to OM1 and OM 2, SVG to RCA   DENTAL SURGERY  2003   GASTRIC BYPASS  2010   HERNIA REPAIR  2011, 2012   [place] N/A 07/23/2013   Procedure: HERNIA REPAIR INCISIONAL WITH MESH;  Surgeon: Jamesetta So, MD;  Location: [facility];  Service: General;  Laterality: N/A;   INSERTION OF MESH N/A 07/23/2013   Procedure: INSERTION OF MESH;  Surgeon: Jamesetta So, MD;  Location: [facility];  Service: General;  Laterality: N/A;   LEFT HEART CATHETERIZATION WITH CORONARY/GRAFT ANGIOGRAM N/A 11/01/2013  Procedure: LEFT HEART CATHETERIZATION WITH Beatrix Fetters;  Surgeon: Blane Ohara, MD;  Location: [facility] CATH LAB;  Service: Cardiovascular;  Laterality: N/A;   LYMPH NODE DISSECTION Bilateral 04/04/2021   Procedure: PELVIC LYMPH NODE DISSECTION;  Surgeon: Alexis Frock, MD;  Location: [facility];  Service: Urology;  Laterality: Bilateral;   ROBOT ASSISTED LAPAROSCOPIC RADICAL PROSTATECTOMY N/A 04/04/2021   Procedure: XI ROBOTIC ASSISTED LAPAROSCOPIC RADICAL PROSTATECTOMY WITH INDOCYANINE GREEN DYE;  Surgeon: Alexis Frock, MD;  Location: [facility];  Service: Urology;  Laterality: N/A;  3 HRS   TOE AMPUTATION  1998   right 1st and 2nd toe   TONSILECTOMY, ADENOIDECTOMY, BILATERAL MYRINGOTOMY AND TUBES     TONSILLECTOMY     VENTRAL HERNIA REPAIR N/A 10/28/2012   Procedure: LAPAROSCOPIC VENTRAL HERNIA;  Surgeon: Donato Heinz, MD;  Location: [facility];  Service: General;  Laterality: N/A;   Past Surgical  History:  Procedure Laterality Date   CHOLECYSTECTOMY     CORONARY ARTERY BYPASS GRAFT  2010   LIMA to LAD, SVG to diagonal, SVG to OM1 and OM 2, SVG to RCA   DENTAL SURGERY  2003   GASTRIC BYPASS  2010   HERNIA REPAIR  2011, 2012   [place] N/A 07/23/2013   Procedure: HERNIA REPAIR INCISIONAL WITH MESH;  Surgeon: Jamesetta So, MD;  Location: [facility];  Service: General;  Laterality: N/A;   INSERTION OF MESH N/A 07/23/2013   Procedure: INSERTION OF MESH;  Surgeon: Jamesetta So, MD;  Location: [facility];  Service: General;  Laterality: N/A;   LEFT HEART CATHETERIZATION WITH CORONARY/GRAFT ANGIOGRAM N/A 11/01/2013   Procedure: LEFT HEART CATHETERIZATION WITH Beatrix Fetters;  Surgeon: Blane Ohara, MD;  Location: [facility] CATH LAB;  Service: Cardiovascular;  Laterality: N/A;   LYMPH NODE DISSECTION Bilateral 04/04/2021   Procedure: PELVIC LYMPH NODE DISSECTION;  Surgeon: Alexis Frock, MD;  [facility];  Service: Urology;  Laterality: Bilateral;   ROBOT ASSISTED LAPAROSCOPIC RADICAL PROSTATECTOMY N/A 04/04/2021   Procedure: XI ROBOTIC ASSISTED LAPAROSCOPIC RADICAL PROSTATECTOMY WITH INDOCYANINE GREEN DYE;  Surgeon: Alexis Frock, MD;  [facility];  Service: Urology;  Laterality: N/A;  3 HRS   TOE AMPUTATION  1998   right 1st and 2nd toe   TONSILECTOMY, ADENOIDECTOMY, BILATERAL MYRINGOTOMY AND TUBES     TONSILLECTOMY     VENTRAL HERNIA REPAIR N/A 10/28/2012   Procedure: LAPAROSCOPIC VENTRAL HERNIA;  Surgeon: Donato Heinz, MD;  Location: [facility];  Service: General;  Laterality: N/A;   Past Medical History:  Diagnosis Date   Anaphylaxis    IGE mediated   Anxiety    Arthritis    Cancer ([place])    Prostate   Cardiomyopathy ([place])    CHF (congestive heart failure) ([place])    COPD (chronic obstructive pulmonary disease) ([place])    Coronary atherosclerosis of native coronary artery    Multivessel status post CABG 2010   Depression    Essential hypertension     History of CVA (cerebrovascular accident) 01/2012   Right posterior frontal cortical and subcortical brain by MRI, no hemorrhage. Carotid Dopplers showed only 1-50% bilateral ICA stenoses. Echocardiogram showed LVEF 50%, no major valvular abnormalities.   History of kidney stones    Mixed hyperlipidemia    Myocardial infarction ([place]) 2010   OSA (obstructive sleep apnea)    Pneumonia    Prostate cancer ([place]) 12/2020   Dr. Tresa Moore at [facility]   Stroke [facility])    Type 2 diabetes  mellitus (HCC)    BP 99/67    Pulse 90    Temp 98.6 F (37 C)    Ht 5\' 11"  (1.803 m)    Wt 219 lb (99.3 kg)    SpO2 96%    BMI 30.54 kg/m   Opioid Risk Score:   Fall Risk Score:  `1  Depression screen PHQ 2/9  Depression screen [facility] 2/9 06/26/2021 04/27/2021 02/22/2021 10/10/2020 04/07/2020 11/06/2018 08/14/2018  Decreased Interest 1 1 1 1  0 0 1  Down, Depressed, Hopeless 1 1 1 1  0 0 1  PHQ - 2 Score 2 2 2 2  0 0 2  Altered sleeping - - - - - - -  Tired, decreased energy - - - - - - -  Change in appetite - - - - - - -  Feeling bad or failure about yourself  - - - - - - -  Trouble concentrating - - - - - - -  Moving slowly or fidgety/restless - - - - - - -  Suicidal thoughts - - - - - - -  PHQ-9 Score - - - - - - -  Difficult doing work/chores - - - - - - -  Some encounter information is confidential and restricted. Go to Review Flowsheets activity to see all data.  Some recent data might be hidden     Review of Systems  Constitutional: Negative.   HENT: Negative.    Eyes: Negative.   Respiratory: Negative.    Cardiovascular: Negative.   Gastrointestinal: Negative.   Endocrine: Negative.   Genitourinary: Negative.   Musculoskeletal:        Left arm pain  Skin: Negative.   Allergic/Immunologic: Negative.   Neurological: Negative.   Hematological: Negative.   Psychiatric/Behavioral:  Positive for dysphoric mood.   All other systems reviewed and are negative.     Objective:   Physical  Exam Vitals and nursing note reviewed.  Constitutional:      Appearance: He is normal weight.  Eyes:     Extraocular Movements: Extraocular movements intact.     Conjunctiva/sclera: Conjunctivae normal.     Pupils: Pupils are equal, round, and reactive to light.  Skin:    General: Skin is warm and dry.  Neurological:     Mental Status: He is alert and oriented to person, place, and time.  Psychiatric:        Mood and Affect: Mood normal.        Behavior: Behavior normal.   Motor 2- in deltoid and elbow flexor on left side  3- HF and KE on left 0/5 at L foot and ankle  Tone MAS 2 Left elbow flexors MAS 2 in Left finger flexors MAS 3 in L Thumb flexors  MAS 4 in ankle PF MAS 2 with clonus Left knee flexors      Assessment & Plan:   Left spastic hemiparesis due to Right MCA infarct, remote, Good result with botulinum toxin injection but would adjust dose as follows to address Left thumb flexor spasticity  LUE FDS 200-  FDP 200-   Biceps 200  Left FPL 100   LLE   Med  gastroc 200U Med hamstrings 100U- consider increasing next injection Tibialis post  300U- consider decreasing next visit   Soleus  200U  3.  Frozen shoulder on Left , may benefit from corticosteroid injection

## 2021-07-15 DIAGNOSIS — F331 Major depressive disorder, recurrent, moderate: Secondary | ICD-10-CM | POA: Diagnosis not present

## 2021-07-17 DIAGNOSIS — C61 Malignant neoplasm of prostate: Secondary | ICD-10-CM | POA: Diagnosis not present

## 2021-07-17 NOTE — Progress Notes (Signed)
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  Secondary stroke prevention as managed by PCP:  - ASA and Plavix   - Statin.  LDL goal less than 70  - Hgb A1c goal less than 7  - Normotensive blood pressure 3.  Follow up 9 months   Subjective:  [name] is a 56 year old right-handed man with hypertension, type 2 diabetes mellitus, CAD, ischemic cardiomyopathy, COPD, tobacco use disorder, depression, prostate cancer and history of prior strokes who follows up for migraines.   UPDATE: Current medications:  Plavix 75mg , atorvastatin 80mg , Coreg, Imdur, Lasix, alprazolam 1mg  PRN, baclofen 10mg  BID PRN, Cymbalta 90mg  daily, Lyrica 75mg  BID   Last visit, gabapentin was switched to Lyrica to address occipital neuralgia.  He notes improvement.  He still has daily headache but they are now mild, lasting 30-60 minutes and only occurring once a day.  He says they are manageable.  HISTORY: He was [facility] to [facility] on 06/14/17 with sudden onset left sided weakness and numbness and difficulty speaking.  He initially presented to [facility] where he received tPA with NIHSS of 15 and was then transferred to [facility].    CT of head from [facility] reportedly showed acute infarction involving portions of the posterior right basal ganglia, right corona radiata and body of the right caudate nucleus.  MRI of brain at [facility] confirmed acute right basal ganglia infarct as well as remote right frontal infarct.  MRA of head and carotid doppler revealed no  significant intracranial or extracranial arterial stenosis or occlusion.  He was unable to have CTA due to allergy to iodinated contrast.    Echocardiogram with bubble study showed EF of 25-30% with no evidence of PFO or ASD.  He was evaluated by cardiology for ischemic cardiomyopathy.  Lexiscan nuclear perfusion study revealed EF 18% with large infarct involving the apex and periapical anterior wall with no evidence of ischemia.  He was advised to follow up with outpatient cardiology.  LDL was 123 and triglycerides were 237.  Hgb A1c was 5.7.  He was already taking ASA 325mg  but not daily, as well as Plavix.  He is continued on ASA and Plavix.  06/30/2019 Echocardiogram:  LV EF 40-50%   He was seen in the ED at [facility] on 09/22/17 for headache.  CT of head showed no acute findings but was read as demonstrating a chronic appearing infarct in the right posterior limb of internal capsule that was not present on prior CT from 06/14/17.  He also complained of left leg pain.  He reportedly had an elevated d dimer.  CT chest and venous doppler of lower extremity were negative for DVT.  However, the started him on Xarelto.  He was told to stop Plavix by his PCP.  I contacted his PCP, Dr. Quillian Quince, who reviewed the ED notes and is also unsure why they started Xarelto when imaging was negative for DVT.  He was advised to discontinue Xarelto and restart Plavix with aspirin.   Headaches returned in December 2020.  Right sided 10/10 throbbing/pressure pain in the temple radiating to back of head.  Associated nausea, blurred vision, photophobia.  They  last 2 hours and occur 2 to 3 days a week.  He reports right occipital/suboccipital numbness and tingling off and on.  He also notes that his right hand twitches about every other day, lasting 20-30 minutes.  It may occur while holding a cup but also at rest.  Due to worsening headache, MRI of brain without contrast was performed on 09/13/2019, which was  personally reviewed, and demonstrated stable advanced chronic small vessel ischemic changes but no acute intracranial abnormality. Due to right hand myoclonus, EEG was performed on 08/17/2019, which was normal.  MRI of cervical spine on 08/08/2020 showed mild cervical spondylosis with mild spinal stenosis and mild to moderate neural foraminal stenosis at C3-4 and C4-5.   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  He did go to PT and speech therapy.     He sees Dr. Letta Pate for Dysport injection to treat spasticity.  He reports panic attacks.  He sees Dr. Toy Care for depression and anxiety.   Past medications:  gabapentin 600mg  BID  PAST MEDICAL HISTORY: Past Medical History:  Diagnosis Date   Anaphylaxis    IGE mediated   Anxiety    Arthritis    Cancer ([place])    Prostate   Cardiomyopathy ([place])    CHF (congestive heart failure) (HCC)    COPD (chronic obstructive pulmonary disease) ([place])    Coronary atherosclerosis of native coronary artery    Multivessel status post CABG 2010   Depression    Essential hypertension    History of CVA (cerebrovascular accident) 01/2012   Right posterior frontal cortical and subcortical brain by MRI, no hemorrhage. Carotid Dopplers showed only 1-50% bilateral ICA stenoses. Echocardiogram showed LVEF 50%, no major valvular abnormalities.   History of kidney stones    Mixed hyperlipidemia    Myocardial infarction ([place]) 2010   OSA (obstructive sleep apnea)    Pneumonia    Prostate cancer ([place]) 12/2020   Dr. Tresa Moore at [facility]   Stroke [facility])    Type 2 diabetes mellitus [facility])     MEDICATIONS: Current Outpatient Medications on File Prior to Visit  Medication Sig Dispense Refill   acetaminophen (TYLENOL) 500 MG tablet Take 1,000 mg by mouth every 6 (six) hours as needed for moderate pain or headache.      albuterol (PROVENTIL HFA;VENTOLIN HFA) 108 (90 BASE) MCG/ACT inhaler Inhale 2 puffs into the lungs every 6 (six) hours as needed for wheezing or shortness of breath.     ALPRAZolam (XANAX) 1 MG tablet 0.5-1 mg daily as needed for anxiety 30 tablet 2   aspirin EC 81 MG tablet Take 1 tablet (81 mg total) by mouth daily. 90 tablet 3   atorvastatin (LIPITOR) 40 MG tablet TAKE 1 TABLET ONCE DAILY. 30 tablet 3   baclofen (LIORESAL) 10 MG tablet Take by mouth.     baclofen (LIORESAL) 20 MG tablet Take 1 tablet (20 mg total) by mouth 2 (two) times daily. 60 tablet 5   Cholecalciferol (VITAMIN D3) 50 MCG (2000 UT) CAPS Take by mouth.     clopidogrel (PLAVIX) 75 MG tablet      dapsone 25 MG tablet Take by mouth.     diltiazem (CARDIZEM CD) 240 MG 24 hr capsule Take 240 mg by mouth daily.      diltiazem (TIAZAC) 120 MG 24 hr capsule Take 1 capsule by mouth daily.     docusate sodium (COLACE) 100  MG capsule Take 1 capsule (100 mg total) by mouth 2 (two) times daily.     DULoxetine (CYMBALTA) 30 MG capsule Take by mouth.     DULoxetine (CYMBALTA) 60 MG capsule Take 1 capsule (60 mg total) by mouth daily. 90 capsule 0   ENTRESTO 49-51 MG TAKE (1) TABLET TWICE DAILY. 60 tablet 6   fenofibrate micronized (LOFIBRA) 134 MG capsule Take 134 mg by mouth daily before breakfast.     fluticasone (CUTIVATE) 0.05 % cream Apply 1 application topically 2 (two) times daily.     fluticasone (FLONASE) 50 MCG/ACT nasal spray 2 sprays into each nostril daily.     furosemide (LASIX) 20 MG tablet Take 20 mg by mouth daily.      gabapentin (NEURONTIN) 600 MG tablet      guaiFENesin (ROBITUSSIN) 100 MG/5ML liquid Take by mouth.     HYDROcodone-acetaminophen (NORCO/VICODIN) 5-325 MG tablet Take by mouth.     isosorbide mononitrate (IMDUR) 30 MG 24 hr tablet Take 1 tablet by mouth daily.     isosorbide mononitrate (IMDUR) 60 MG 24 hr tablet Take 60 mg by mouth 2 (two) times daily.      levofloxacin (LEVAQUIN) 750 MG tablet  Take 750 mg by mouth daily.     lisinopril (ZESTRIL) 5 MG tablet Take 1 tablet by mouth daily.     naloxone (NARCAN) nasal spray 4 mg/0.1 mL Place 1 spray into the nose as needed (opioid overdose).     nicotine polacrilex (NICORETTE) 4 MG gum Place inside cheek.     nitroGLYCERIN (NITROSTAT) 0.4 MG SL tablet Place 0.4 mg under the tongue every 5 (five) minutes as needed. For chest pain     oxyCODONE-acetaminophen (PERCOCET) 7.5-325 MG tablet Take 1 tablet by mouth every 6 (six) hours as needed for moderate pain. 10 tablet 0   pregabalin (LYRICA) 75 MG capsule Take 1 capsule (75 mg total) by mouth 2 (two) times daily. 60 capsule 5   Semaglutide, 1 MG/DOSE, (OZEMPIC, 1 MG/DOSE,) 4 MG/3ML SOPN Inject into the skin.     silver sulfADIAZINE (SILVADENE) 1 % cream Apply 1 application topically 3 (three) times daily.     tamsulosin (FLOMAX) 0.4 MG CAPS capsule Take 0.4 mg by mouth.     tiZANidine (ZANAFLEX) 2 MG tablet Take by mouth every 6 (six) hours as needed for muscle spasms.     traZODone (DESYREL) 50 MG tablet Take 50 mg by mouth at bedtime.     triamcinolone cream (KENALOG) 0.1 % Apply 1 application topically 2 (two) times daily.     varenicline (CHANTIX) 1 MG tablet Take by mouth.     No current facility-administered medications on file prior to visit.    ALLERGIES: Allergies  Allergen Reactions   Contrast Media [Iodinated Contrast Media] Anaphylaxis, Shortness Of Breath, Swelling and Rash    Isovue contrast is most acceptable agent based on previous experience and testing with premedications   Ibuprofen Anaphylaxis, Hives and Swelling   Nsaids Anaphylaxis, Hives, Swelling and Rash    Can take Aspirin 325 mg or lower   Chantix [Varenicline]     Nightmares    Other Other (See Comments)    Anti-physcotics Personality change    FAMILY HISTORY: Family History  Problem Relation Age of Onset   Lung cancer Father    Heart disease Father    Heart disease Mother    Congestive Heart  Failure Mother    Diabetes Mother    Hyperlipidemia Mother  Alcohol abuse Brother    Breast cancer Maternal Aunt    Suicidality Cousin       Objective:  Blood pressure (!) 92/51, pulse 86, height 5\' 11"  (1.803 m), weight 219 lb (99.3 kg), SpO2 96 %. General: No acute distress.  Patient appears well-groomed.   Head:  Normocephalic/atraumatic Eyes:  Fundi examined but not visualized Neck: supple, no paraspinal tenderness, full range of motion Heart:  Regular rate and rhythm Lungs:  Clear to auscultation bilaterally Back: No paraspinal tenderness Neurological Exam: alert and oriented to person, place, and time. Speech fluent and not dysarthric, language intact.  Decreased left V2.  Otherwise, CN II-XII intact. Bulk and tone flaccid in left upper extremity.  Muscle strength plegic left upper extremity except 2+/5 hand grip, 2+/5 left hip flexion, plegic left knee flexion, left foot drop, left knee extension; 5/5 right upper and lower extremities.  Sensation to light touch decreased in left upper and lower extremities, Deep tendon reflexes 3+ left upper and lower extremities, 2+ on right.  Finger to nose testing intact on right, unable to assess left.  Gait:  In wheelchair.   Metta Clines, DO  CC: Gar Ponto, MD

## 2021-07-18 ENCOUNTER — Encounter: Payer: Self-pay | Admitting: Neurology

## 2021-07-18 ENCOUNTER — Other Ambulatory Visit: Payer: Self-pay

## 2021-07-18 ENCOUNTER — Ambulatory Visit (INDEPENDENT_AMBULATORY_CARE_PROVIDER_SITE_OTHER): Payer: Medicare HMO | Admitting: Neurology

## 2021-07-18 VITALS — BP 92/51 | HR 86 | Ht 71.0 in | Wt 219.0 lb

## 2021-07-18 DIAGNOSIS — I69354 Hemiplegia and hemiparesis following cerebral infarction affecting left non-dominant side: Secondary | ICD-10-CM

## 2021-07-18 DIAGNOSIS — E785 Hyperlipidemia, unspecified: Secondary | ICD-10-CM | POA: Diagnosis not present

## 2021-07-18 DIAGNOSIS — M5481 Occipital neuralgia: Secondary | ICD-10-CM | POA: Diagnosis not present

## 2021-07-18 DIAGNOSIS — I10 Essential (primary) hypertension: Secondary | ICD-10-CM

## 2021-07-18 DIAGNOSIS — K921 Melena: Secondary | ICD-10-CM | POA: Diagnosis not present

## 2021-07-18 DIAGNOSIS — Z794 Long term (current) use of insulin: Secondary | ICD-10-CM | POA: Diagnosis not present

## 2021-07-18 DIAGNOSIS — E118 Type 2 diabetes mellitus with unspecified complications: Secondary | ICD-10-CM

## 2021-07-18 DIAGNOSIS — F01A0 Vascular dementia, mild, without behavioral disturbance, psychotic disturbance, mood disturbance, and anxiety: Secondary | ICD-10-CM

## 2021-07-18 DIAGNOSIS — R300 Dysuria: Secondary | ICD-10-CM | POA: Diagnosis not present

## 2021-07-18 DIAGNOSIS — F17210 Nicotine dependence, cigarettes, uncomplicated: Secondary | ICD-10-CM | POA: Diagnosis not present

## 2021-07-18 DIAGNOSIS — N480 Leukoplakia of penis: Secondary | ICD-10-CM | POA: Diagnosis not present

## 2021-07-18 DIAGNOSIS — L732 Hidradenitis suppurativa: Secondary | ICD-10-CM | POA: Diagnosis not present

## 2021-07-18 NOTE — Patient Instructions (Signed)
Continue pregablin 75mg  twice daily.  We have room to increase dose if needed Follow up 9 months.

## 2021-07-20 ENCOUNTER — Ambulatory Visit: Payer: Medicare HMO | Admitting: Physical Medicine & Rehabilitation

## 2021-07-24 DIAGNOSIS — Z89421 Acquired absence of other right toe(s): Secondary | ICD-10-CM | POA: Diagnosis not present

## 2021-07-24 DIAGNOSIS — N5231 Erectile dysfunction following radical prostatectomy: Secondary | ICD-10-CM | POA: Diagnosis not present

## 2021-07-24 DIAGNOSIS — C775 Secondary and unspecified malignant neoplasm of intrapelvic lymph nodes: Secondary | ICD-10-CM | POA: Diagnosis not present

## 2021-07-24 DIAGNOSIS — E1165 Type 2 diabetes mellitus with hyperglycemia: Secondary | ICD-10-CM | POA: Diagnosis not present

## 2021-07-24 DIAGNOSIS — F1120 Opioid dependence, uncomplicated: Secondary | ICD-10-CM | POA: Diagnosis not present

## 2021-07-24 DIAGNOSIS — G894 Chronic pain syndrome: Secondary | ICD-10-CM | POA: Diagnosis not present

## 2021-07-24 DIAGNOSIS — Z79899 Other long term (current) drug therapy: Secondary | ICD-10-CM | POA: Diagnosis not present

## 2021-07-24 DIAGNOSIS — M545 Low back pain, unspecified: Secondary | ICD-10-CM | POA: Diagnosis not present

## 2021-07-24 DIAGNOSIS — Z6830 Body mass index (BMI) 30.0-30.9, adult: Secondary | ICD-10-CM | POA: Diagnosis not present

## 2021-07-24 DIAGNOSIS — M79662 Pain in left lower leg: Secondary | ICD-10-CM | POA: Diagnosis not present

## 2021-07-24 DIAGNOSIS — C61 Malignant neoplasm of prostate: Secondary | ICD-10-CM | POA: Diagnosis not present

## 2021-07-24 DIAGNOSIS — N393 Stress incontinence (female) (male): Secondary | ICD-10-CM | POA: Diagnosis not present

## 2021-07-24 DIAGNOSIS — F17210 Nicotine dependence, cigarettes, uncomplicated: Secondary | ICD-10-CM | POA: Diagnosis not present

## 2021-07-27 ENCOUNTER — Other Ambulatory Visit: Payer: Self-pay | Admitting: Neurology

## 2021-07-31 DIAGNOSIS — K5900 Constipation, unspecified: Secondary | ICD-10-CM | POA: Diagnosis not present

## 2021-07-31 DIAGNOSIS — K921 Melena: Secondary | ICD-10-CM | POA: Diagnosis not present

## 2021-07-31 DIAGNOSIS — Z89432 Acquired absence of left foot: Secondary | ICD-10-CM | POA: Diagnosis not present

## 2021-07-31 DIAGNOSIS — K6289 Other specified diseases of anus and rectum: Secondary | ICD-10-CM | POA: Diagnosis not present

## 2021-08-01 ENCOUNTER — Telehealth: Payer: Self-pay | Admitting: *Deleted

## 2021-08-01 NOTE — Telephone Encounter (Signed)
° °  Pre-operative Risk Assessment    Patient Name: [name]  DOB: 05/22/1966 MRN: [id number]      Request for Surgical Clearance    Procedure:   Colonoscopy  Date of Surgery:  Clearance 08/29/21                                 Surgeon:  Dr. Michail Sermon Surgeon's Group or Practice Name:  [facility] Phone number:  [phone] Fax number:  [phone]   Type of Clearance Requested:   - Pharmacy:  Hold Clopidogrel (Plavix) please give stop and start date   Type of Anesthesia:   Propofol   Additional requests/questions:    [name]   08/01/2021, 8:51 AM

## 2021-08-01 NOTE — Telephone Encounter (Signed)
° °  Name: [name]  DOB: 1965-10-14  MRN: [id number]   Primary Cardiologist: Rozann Lesches, MD  Chart reviewed as part of pre-operative protocol coverage. Patient was contacted 08/01/2021 in reference to pre-operative risk assessment for pending surgery as outlined below.  [name] was last seen on 03/23/2021 by Dr. Domenic Polite.  Since that day, [name] has done well without chest pain or worsening dyspnea.  Therefore, based on ACC/AHA guidelines, the patient would be at acceptable risk for the planned procedure without further cardiovascular testing.   Patient previously had to hold Plavix for 5 days for different procedure in 2022.  He will also need to hold Plavix for 5 days prior to GI procedure as well.  Will defer to GI doctor to decide what is the earliest time for him to resume Plavix afterward.  The patient was advised that if he develops new symptoms prior to surgery to contact our office to arrange for a follow-up visit, and he verbalized understanding.  I will route this recommendation to the requesting party via Epic fax function and remove from pre-op pool. Please call with questions.  [place], [place] 08/01/2021, 1:35 PM

## 2021-08-15 DIAGNOSIS — N184 Chronic kidney disease, stage 4 (severe): Secondary | ICD-10-CM | POA: Diagnosis not present

## 2021-08-15 DIAGNOSIS — I10 Essential (primary) hypertension: Secondary | ICD-10-CM | POA: Diagnosis not present

## 2021-08-15 DIAGNOSIS — E1165 Type 2 diabetes mellitus with hyperglycemia: Secondary | ICD-10-CM | POA: Diagnosis not present

## 2021-08-15 DIAGNOSIS — E7849 Other hyperlipidemia: Secondary | ICD-10-CM | POA: Diagnosis not present

## 2021-08-15 DIAGNOSIS — R972 Elevated prostate specific antigen [PSA]: Secondary | ICD-10-CM | POA: Diagnosis not present

## 2021-08-15 DIAGNOSIS — K219 Gastro-esophageal reflux disease without esophagitis: Secondary | ICD-10-CM | POA: Diagnosis not present

## 2021-08-15 DIAGNOSIS — E782 Mixed hyperlipidemia: Secondary | ICD-10-CM | POA: Diagnosis not present

## 2021-08-16 ENCOUNTER — Encounter: Payer: Medicare HMO | Attending: Physical Medicine & Rehabilitation | Admitting: Physical Medicine & Rehabilitation

## 2021-08-16 ENCOUNTER — Other Ambulatory Visit: Payer: Self-pay

## 2021-08-16 ENCOUNTER — Encounter: Payer: Self-pay | Admitting: Physical Medicine & Rehabilitation

## 2021-08-16 VITALS — BP 97/62 | HR 74 | Temp 98.3°F | Ht 71.0 in

## 2021-08-16 DIAGNOSIS — G8110 Spastic hemiplegia affecting unspecified side: Secondary | ICD-10-CM | POA: Insufficient documentation

## 2021-08-16 DIAGNOSIS — G8114 Spastic hemiplegia affecting left nondominant side: Secondary | ICD-10-CM | POA: Diagnosis not present

## 2021-08-16 NOTE — Progress Notes (Signed)
Dysport Injection for spasticity using needle EMG guidance  Dilution: 200 Units/ml Indication: Severe spasticity which interferes with ADL,mobility and/or  hygiene and is unresponsive to medication management and other conservative care Informed consent was obtained after describing risks and benefits of the procedure with the patient. This includes bleeding, bruising, infection, excessive weakness, or medication side effects. A REMS form is on file and signed. Needle:  needle electrode Number of units per muscle LUE FDS 200-  FDP 200-   Biceps 200  Left FPL 100   LLE   Med  gastroc 200U Med hamstrings 100U- consider increasing next injection Tibialis post  300U- consider decreasing next visit   Soleus  200U All injections were done after obtaining appropriate EMG activity and after negative drawback for blood. The patient tolerated the procedure well. Post procedure instructions were given. A followup appointment was made.

## 2021-08-16 NOTE — Patient Instructions (Signed)

## 2021-08-21 DIAGNOSIS — J449 Chronic obstructive pulmonary disease, unspecified: Secondary | ICD-10-CM | POA: Diagnosis not present

## 2021-08-21 DIAGNOSIS — Z23 Encounter for immunization: Secondary | ICD-10-CM | POA: Diagnosis not present

## 2021-08-21 DIAGNOSIS — E7849 Other hyperlipidemia: Secondary | ICD-10-CM | POA: Diagnosis not present

## 2021-08-21 DIAGNOSIS — R4582 Worries: Secondary | ICD-10-CM | POA: Diagnosis not present

## 2021-08-21 DIAGNOSIS — G8102 Flaccid hemiplegia affecting left dominant side: Secondary | ICD-10-CM | POA: Diagnosis not present

## 2021-08-21 DIAGNOSIS — I10 Essential (primary) hypertension: Secondary | ICD-10-CM | POA: Diagnosis not present

## 2021-08-21 DIAGNOSIS — I25119 Atherosclerotic heart disease of native coronary artery with unspecified angina pectoris: Secondary | ICD-10-CM | POA: Diagnosis not present

## 2021-08-21 DIAGNOSIS — J4530 Mild persistent asthma, uncomplicated: Secondary | ICD-10-CM | POA: Diagnosis not present

## 2021-08-24 DIAGNOSIS — F17210 Nicotine dependence, cigarettes, uncomplicated: Secondary | ICD-10-CM | POA: Diagnosis not present

## 2021-08-24 DIAGNOSIS — M79662 Pain in left lower leg: Secondary | ICD-10-CM | POA: Diagnosis not present

## 2021-08-24 DIAGNOSIS — R030 Elevated blood-pressure reading, without diagnosis of hypertension: Secondary | ICD-10-CM | POA: Diagnosis not present

## 2021-08-24 DIAGNOSIS — I639 Cerebral infarction, unspecified: Secondary | ICD-10-CM | POA: Diagnosis not present

## 2021-08-24 DIAGNOSIS — Z79899 Other long term (current) drug therapy: Secondary | ICD-10-CM | POA: Diagnosis not present

## 2021-08-24 DIAGNOSIS — Z6828 Body mass index (BMI) 28.0-28.9, adult: Secondary | ICD-10-CM | POA: Diagnosis not present

## 2021-08-24 DIAGNOSIS — E1165 Type 2 diabetes mellitus with hyperglycemia: Secondary | ICD-10-CM | POA: Diagnosis not present

## 2021-08-24 DIAGNOSIS — M545 Low back pain, unspecified: Secondary | ICD-10-CM | POA: Diagnosis not present

## 2021-08-27 NOTE — Progress Notes (Deleted)
BH MD/PA/NP OP Progress Note  08/27/2021 8:29 AM [name]  MRN:  [id number]  Chief Complaint:  HPI:  - gabapentin was switched to Lyrica for occipital neuralgia   Visit Diagnosis: No diagnosis found.  Past Psychiatric History: Please see initial evaluation for full details. I have reviewed the history. No updates at this time.     Past Medical History:  Past Medical History:  Diagnosis Date   Anaphylaxis    IGE mediated   Anxiety    Arthritis    Cancer ([place])    Prostate   Cardiomyopathy ([place])    CHF (congestive heart failure) (HCC)    COPD (chronic obstructive pulmonary disease) ([place])    Coronary atherosclerosis of native coronary artery    Multivessel status post CABG 2010   Depression    Essential hypertension    History of CVA (cerebrovascular accident) 01/2012   Right posterior frontal cortical and subcortical brain by MRI, no hemorrhage. Carotid Dopplers showed only 1-50% bilateral ICA stenoses. Echocardiogram showed LVEF 50%, no major valvular abnormalities.   History of kidney stones    Mixed hyperlipidemia    Myocardial infarction ([place]) 2010   OSA (obstructive sleep apnea)    Pneumonia    Prostate cancer ([place]) 12/2020   Dr. Tresa Moore at [facility]   Stroke [facility])    Type 2 diabetes mellitus [facility])     Past Surgical History:  Procedure Laterality Date   CHOLECYSTECTOMY     CORONARY ARTERY BYPASS GRAFT  2010   LIMA to LAD, SVG to diagonal, SVG to OM1 and OM 2, SVG to RCA   DENTAL SURGERY  2003   GASTRIC BYPASS  2010   HERNIA REPAIR  2011, 2012   [place] N/A 07/23/2013   Procedure: HERNIA REPAIR INCISIONAL WITH MESH;  Surgeon: Jamesetta So, MD;  Location: [facility];  Service: General;  Laterality: N/A;   INSERTION OF MESH N/A 07/23/2013   Procedure: INSERTION OF MESH;  Surgeon: Jamesetta So, MD;  Location: [facility];  Service: General;  Laterality: N/A;   LEFT HEART CATHETERIZATION WITH CORONARY/GRAFT ANGIOGRAM N/A 11/01/2013    Procedure: LEFT HEART CATHETERIZATION WITH Beatrix Fetters;  Surgeon: Blane Ohara, MD;  Location: [facility] CATH LAB;  Service: Cardiovascular;  Laterality: N/A;   LYMPH NODE DISSECTION Bilateral 04/04/2021   Procedure: PELVIC LYMPH NODE DISSECTION;  Surgeon: Alexis Frock, MD;  Location: [facility];  Service: Urology;  Laterality: Bilateral;   ROBOT ASSISTED LAPAROSCOPIC RADICAL PROSTATECTOMY N/A 04/04/2021   Procedure: XI ROBOTIC ASSISTED LAPAROSCOPIC RADICAL PROSTATECTOMY WITH INDOCYANINE GREEN DYE;  Surgeon: Alexis Frock, MD;  Location: [facility];  Service: Urology;  Laterality: N/A;  3 HRS   TOE AMPUTATION  1998   right 1st and 2nd toe   TONSILECTOMY, ADENOIDECTOMY, BILATERAL MYRINGOTOMY AND TUBES     TONSILLECTOMY     VENTRAL HERNIA REPAIR N/A 10/28/2012   Procedure: LAPAROSCOPIC VENTRAL HERNIA;  Surgeon: Donato Heinz, MD;  Location: [facility];  Service: General;  Laterality: N/A;    Family Psychiatric History: Please see initial evaluation for full details. I have reviewed the history. No updates at this time.     Family History:  Family History  Problem Relation Age of Onset   Lung cancer Father    Heart disease Father    Heart disease Mother    Congestive Heart Failure Mother    Diabetes Mother    Hyperlipidemia Mother    Alcohol abuse Brother    Breast  cancer Maternal Aunt    Suicidality Cousin     Social History:  Social History   Socioeconomic History   Marital status: Legally Separated    Spouse name: [name]   Number of children: 2   Years of education: Not on file   Highest education level: 12th grade  Occupational History   Occupation: disabled  Tobacco Use   Smoking status: Every Day    Packs/day: 1.00    Years: 30.00    Pack years: 30.00    Types: Cigarettes    Start date: 06/08/1982   Smokeless tobacco: Never   Tobacco comments:    1 pack per day 03/04/2018  Vaping Use   Vaping Use: Never used  Substance and Sexual Activity   Alcohol use: Yes     Comment: occassionally   Drug use: No   Sexual activity: Yes    Birth control/protection: None  Other Topics Concern   Not on file  Social History Narrative   Disabled since age 55 lives with wife and children      Patient is right-handed. He is married, lives in a 1 story house, has a ramp to enter. Drinks 64oz of Mtn. Dew a day. Prior to CVA was walking daily. 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      Social Determinants of Health   Financial Resource Strain: Not on file  Food Insecurity: Not on file  Transportation Needs: Not on file  Physical Activity: Not on file  Stress: Not on file  Social Connections: Not on file    Allergies:  Allergies  Allergen Reactions   Ibuprofen Anaphylaxis, Hives and Swelling   Iodinated Contrast Media Anaphylaxis, Shortness Of Breath, Swelling and Rash    Isovue contrast is most acceptable agent based on previous experience and testing with premedications Other reaction(s): throat swells up   Nsaids Anaphylaxis, Hives, Swelling and Rash    Can take Aspirin 325 mg or lower Other reaction(s): rash   Ace Inhibitors     Other reaction(s): Unknown   Chantix [Varenicline]     Nightmares    Other Other (See Comments)    Anti-physcotics Personality change   Pollen Extract     Other reaction(s): Unknown    Metabolic Disorder Labs: Lab Results  Component Value Date   HGBA1C 4.9 03/30/2021   MPG 93.93 03/30/2021   MPG 126 (H) 10/31/2013   No results found for: PROLACTIN Lab Results  Component Value Date   CHOL 156 11/01/2013   TRIG 185 (H) 11/01/2013   HDL 27 (L) 11/01/2013   CHOLHDL 5.8 11/01/2013   VLDL 37 11/01/2013   LDLCALC 92 11/01/2013   Lab Results  Component Value Date   TSH 1.500 10/31/2013    Therapeutic Level Labs: No results found for: LITHIUM No results found for: VALPROATE No components found for:  CBMZ  Current Medications: Current Outpatient Medications  Medication Sig Dispense  Refill   acetaminophen (TYLENOL) 500 MG tablet Take 1,000 mg by mouth every 6 (six) hours as needed for moderate pain or headache.     albuterol (PROVENTIL HFA;VENTOLIN HFA) 108 (90 BASE) MCG/ACT inhaler Inhale 2 puffs into the lungs every 6 (six) hours as needed for wheezing or shortness of breath.     ALPRAZolam (XANAX) 1 MG tablet 0.5-1 mg daily as needed for anxiety 30 tablet 2   aspirin EC 81 MG tablet Take 1 tablet (81 mg total) by mouth daily. 90 tablet 3   atorvastatin (  LIPITOR) 40 MG tablet TAKE 1 TABLET ONCE DAILY. 30 tablet 3   baclofen (LIORESAL) 10 MG tablet Take by mouth. (Patient not taking: Reported on 07/18/2021)     baclofen (LIORESAL) 20 MG tablet Take 1 tablet (20 mg total) by mouth 2 (two) times daily. (Patient not taking: Reported on 07/18/2021) 60 tablet 5   carvedilol (COREG) 3.125 MG tablet Take 3.125 mg by mouth 2 (two) times daily.     Cholecalciferol (VITAMIN D3) 50 MCG (2000 UT) CAPS Take by mouth.     clopidogrel (PLAVIX) 75 MG tablet      dapsone 25 MG tablet Take by mouth.     diltiazem (CARDIZEM CD) 240 MG 24 hr capsule Take 240 mg by mouth daily.      diltiazem (TIAZAC) 120 MG 24 hr capsule Take 1 capsule by mouth daily.     docusate sodium (COLACE) 100 MG capsule Take 1 capsule (100 mg total) by mouth 2 (two) times daily.     DULoxetine (CYMBALTA) 30 MG capsule Take by mouth.     DULoxetine (CYMBALTA) 60 MG capsule Take 1 capsule (60 mg total) by mouth daily. (Patient not taking: Reported on 07/18/2021) 90 capsule 0   ENTRESTO 49-51 MG TAKE (1) TABLET TWICE DAILY. 60 tablet 6   fenofibrate micronized (LOFIBRA) 134 MG capsule Take 134 mg by mouth daily before breakfast.     fluticasone (CUTIVATE) 0.05 % cream Apply 1 application topically 2 (two) times daily.     fluticasone (FLONASE) 50 MCG/ACT nasal spray 2 sprays into each nostril daily.     furosemide (LASIX) 20 MG tablet Take 20 mg by mouth daily.      guaiFENesin (ROBITUSSIN) 100 MG/5ML liquid Take by mouth.      HYDROcodone-acetaminophen (NORCO/VICODIN) 5-325 MG tablet Take by mouth. (Patient not taking: Reported on 07/18/2021)     isosorbide mononitrate (IMDUR) 30 MG 24 hr tablet Take 1 tablet by mouth daily.     isosorbide mononitrate (IMDUR) 60 MG 24 hr tablet Take 60 mg by mouth 2 (two) times daily.      levofloxacin (LEVAQUIN) 750 MG tablet Take 750 mg by mouth daily.     lisinopril (ZESTRIL) 5 MG tablet Take 1 tablet by mouth daily.     naloxone (NARCAN) nasal spray 4 mg/0.1 mL Place 1 spray into the nose as needed (opioid overdose).     nicotine polacrilex (NICORETTE) 4 MG gum Place inside cheek.     nitroGLYCERIN (NITROSTAT) 0.4 MG SL tablet Place 0.4 mg under the tongue every 5 (five) minutes as needed. For chest pain     ondansetron (ZOFRAN) 8 MG tablet Take 8 mg by mouth 3 (three) times daily.     oxyCODONE-acetaminophen (PERCOCET) 7.5-325 MG tablet Take 1 tablet by mouth every 6 (six) hours as needed for moderate pain. 10 tablet 0   pregabalin (LYRICA) 75 MG capsule TAKE 1 CAPSULE BY MOUTH TWICE DAILY. 60 capsule 5   Semaglutide, 1 MG/DOSE, (OZEMPIC, 1 MG/DOSE,) 4 MG/3ML SOPN Inject into the skin.     silver sulfADIAZINE (SILVADENE) 1 % cream Apply 1 application topically 3 (three) times daily.     tamsulosin (FLOMAX) 0.4 MG CAPS capsule Take 0.4 mg by mouth.     tiZANidine (ZANAFLEX) 2 MG tablet Take by mouth every 6 (six) hours as needed for muscle spasms.     traZODone (DESYREL) 50 MG tablet Take 50 mg by mouth at bedtime.     triamcinolone cream (KENALOG) 0.1 %  Apply 1 application topically 2 (two) times daily.     varenicline (CHANTIX) 1 MG tablet Take by mouth.     No current facility-administered medications for this visit.     Musculoskeletal: Strength & Muscle Tone:  N/A Gait & Station:  N/A Patient leans: N/A  Psychiatric Specialty Exam: Review of Systems  There were no vitals taken for this visit.There is no height or weight on file to calculate BMI.  General  Appearance: {Appearance:22683}  Eye Contact:  {B[facility] EYE CONTACT:22684}  Speech:  Clear and Coherent  Volume:  Normal  Mood:  {BHH MOOD:22306}  Affect:  {Affect (PAA):22687}  Thought Process:  Coherent  Orientation:  Full (Time, Place, and Person)  Thought Content: Logical   Suicidal Thoughts:  {ST/HT (PAA):22692}  Homicidal Thoughts:  {ST/HT (PAA):22692}  Memory:  Immediate;   Good  Judgement:  {Judgement (PAA):22694}  Insight:  {Insight (PAA):22695}  Psychomotor Activity:  Normal  Concentration:  Concentration: Good and Attention Span: Good  Recall:  Good  Fund of Knowledge: Good  Language: Good  Akathisia:  No  Handed:  Right  AIMS (if indicated): not done  Assets:  Communication Skills Desire for Improvement  ADL's:  Intact  Cognition: WNL  Sleep:  {B[facility] GOOD/FAIR/POOR:22877}   Screenings: PHQ2-9    Flowsheet Row Office Visit from 06/26/2021 in [facility] Office Visit from 04/27/2021 in [facility] Video Visit from 03/02/2021 in [place] Office Visit from 02/22/2021 in [facility] Video Visit from 12/04/2020 in [place]  PHQ-2 Total Score 2 2 2 2 4   PHQ-9 Total Score -- -- 7 -- 13      Flowsheet Row Admission (Discharged) from 04/04/2021 in [facility] from 03/30/2021 in [place] Video Visit from 09/05/2020 in [place] No Risk No Risk Low Risk        Assessment and Plan:  Assessment  Plan   The patient demonstrates the following risk factors for suicide: Chronic risk factors for suicide include: {Chronic Risk Factors for LPNPYYF:11021117}. Acute risk factors for suicide include: {Acute Risk Factors for BVAPOLI:10301314}. Protective factors  for this patient include: {Protective Factors for Suicide HOOI:75797282}. Considering these factors, the overall suicide risk at this point appears to be {Desc; low/moderate/high:110033}. Patient {ACTION; IS/IS SUO:15615379} appropriate for outpatient follow up.   [name], MD 08/27/2021, 8:29 AM

## 2021-08-28 ENCOUNTER — Telehealth: Payer: Medicare HMO | Admitting: Psychiatry

## 2021-08-28 ENCOUNTER — Other Ambulatory Visit: Payer: Self-pay

## 2021-08-28 ENCOUNTER — Telehealth: Payer: Self-pay | Admitting: Psychiatry

## 2021-08-28 DIAGNOSIS — L732 Hidradenitis suppurativa: Secondary | ICD-10-CM | POA: Diagnosis not present

## 2021-08-28 DIAGNOSIS — Z5181 Encounter for therapeutic drug level monitoring: Secondary | ICD-10-CM | POA: Diagnosis not present

## 2021-08-28 NOTE — Telephone Encounter (Signed)
Sent link for video visit through Epic. Patient did not sign in. Called the patient for appointment scheduled today. The patient did not answer the phone. Left voice message to contact the office ([phone]).   ?

## 2021-08-29 DIAGNOSIS — D123 Benign neoplasm of transverse colon: Secondary | ICD-10-CM | POA: Diagnosis not present

## 2021-08-29 DIAGNOSIS — D124 Benign neoplasm of descending colon: Secondary | ICD-10-CM | POA: Diagnosis not present

## 2021-08-29 DIAGNOSIS — K648 Other hemorrhoids: Secondary | ICD-10-CM | POA: Diagnosis not present

## 2021-08-29 DIAGNOSIS — K921 Melena: Secondary | ICD-10-CM | POA: Diagnosis not present
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
## 2021-08-31 ENCOUNTER — Other Ambulatory Visit: Payer: Self-pay

## 2021-08-31 ENCOUNTER — Encounter: Payer: Self-pay | Admitting: Psychiatry

## 2021-08-31 ENCOUNTER — Telehealth (INDEPENDENT_AMBULATORY_CARE_PROVIDER_SITE_OTHER): Payer: Medicare HMO | Admitting: Psychiatry

## 2021-08-31 DIAGNOSIS — F3341 Major depressive disorder, recurrent, in partial remission: Secondary | ICD-10-CM | POA: Diagnosis not present

## 2021-08-31 DIAGNOSIS — D124 Benign neoplasm of descending colon: Secondary | ICD-10-CM | POA: Diagnosis not present

## 2021-08-31 DIAGNOSIS — D123 Benign neoplasm of transverse colon: Secondary | ICD-10-CM | POA: Diagnosis not present

## 2021-08-31 DIAGNOSIS — F419 Anxiety disorder, unspecified: Secondary | ICD-10-CM

## 2021-08-31 NOTE — Patient Instructions (Signed)
1. Continue duloxetine 60 mg daily  2. Next appointment:  5/11 at 8:40

## 2021-09-20 DIAGNOSIS — G894 Chronic pain syndrome: Secondary | ICD-10-CM | POA: Diagnosis not present

## 2021-09-20 DIAGNOSIS — E1165 Type 2 diabetes mellitus with hyperglycemia: Secondary | ICD-10-CM | POA: Diagnosis not present

## 2021-09-20 DIAGNOSIS — F17210 Nicotine dependence, cigarettes, uncomplicated: Secondary | ICD-10-CM | POA: Diagnosis not present

## 2021-09-20 DIAGNOSIS — M79662 Pain in left lower leg: Secondary | ICD-10-CM | POA: Diagnosis not present

## 2021-09-20 DIAGNOSIS — R030 Elevated blood-pressure reading, without diagnosis of hypertension: Secondary | ICD-10-CM | POA: Diagnosis not present

## 2021-09-20 DIAGNOSIS — M545 Low back pain, unspecified: Secondary | ICD-10-CM | POA: Diagnosis not present

## 2021-09-20 DIAGNOSIS — Z79899 Other long term (current) drug therapy: Secondary | ICD-10-CM | POA: Diagnosis not present

## 2021-09-24 DIAGNOSIS — Z79899 Other long term (current) drug therapy: Secondary | ICD-10-CM | POA: Diagnosis not present

## 2021-09-25 ENCOUNTER — Other Ambulatory Visit: Payer: Self-pay | Admitting: Psychiatry

## 2021-09-25 ENCOUNTER — Telehealth: Payer: Self-pay

## 2021-09-25 DIAGNOSIS — F3341 Major depressive disorder, recurrent, in partial remission: Secondary | ICD-10-CM

## 2021-09-25 MED ORDER — DULOXETINE HCL 60 MG PO CPEP: 60.0000 mg | ORAL_CAPSULE | Freq: Every day | ORAL | 0 refills | Status: DC

## 2021-09-25 NOTE — Telephone Encounter (Signed)
Ordered

## 2021-09-25 NOTE — Telephone Encounter (Signed)
received fax requesting a refill on the duloxetine  ?

## 2021-09-27 ENCOUNTER — Emergency Department (HOSPITAL_COMMUNITY)
Admission: EM | Admit: 2021-09-27 | Discharge: 2021-09-27 | Disposition: A | Discharge status: Skilled Nursing Facility | Payer: Medicare HMO | Attending: Emergency Medicine | Admitting: Emergency Medicine

## 2021-09-27 ENCOUNTER — Emergency Department (HOSPITAL_COMMUNITY): Payer: Medicare HMO

## 2021-09-27 ENCOUNTER — Other Ambulatory Visit: Payer: Self-pay

## 2021-09-27 ENCOUNTER — Encounter: Payer: Self-pay | Admitting: Physical Medicine & Rehabilitation

## 2021-09-27 ENCOUNTER — Encounter (HOSPITAL_COMMUNITY): Payer: Self-pay | Admitting: Emergency Medicine

## 2021-09-27 ENCOUNTER — Encounter: Payer: Medicare HMO | Attending: Physical Medicine & Rehabilitation | Admitting: Physical Medicine & Rehabilitation

## 2021-09-27 VITALS — BP 84/54 | HR 74 | Temp 98.0°F | Ht 71.0 in | Wt 218.9 lb

## 2021-09-27 DIAGNOSIS — Z79899 Other long term (current) drug therapy: Secondary | ICD-10-CM | POA: Diagnosis not present

## 2021-09-27 DIAGNOSIS — S0081XA Abrasion of other part of head, initial encounter: Secondary | ICD-10-CM | POA: Diagnosis not present

## 2021-09-27 DIAGNOSIS — S7002XA Contusion of left hip, initial encounter: Secondary | ICD-10-CM | POA: Diagnosis not present

## 2021-09-27 DIAGNOSIS — S0990XA Unspecified injury of head, initial encounter: Secondary | ICD-10-CM | POA: Diagnosis not present

## 2021-09-27 DIAGNOSIS — I10 Essential (primary) hypertension: Secondary | ICD-10-CM | POA: Insufficient documentation

## 2021-09-27 DIAGNOSIS — I2510 Atherosclerotic heart disease of native coronary artery without angina pectoris: Secondary | ICD-10-CM | POA: Insufficient documentation

## 2021-09-27 DIAGNOSIS — W050XXA Fall from non-moving wheelchair, initial encounter: Secondary | ICD-10-CM | POA: Insufficient documentation

## 2021-09-27 DIAGNOSIS — G9389 Other specified disorders of brain: Secondary | ICD-10-CM | POA: Diagnosis not present

## 2021-09-27 DIAGNOSIS — G4489 Other headache syndrome: Secondary | ICD-10-CM | POA: Diagnosis not present

## 2021-09-27 DIAGNOSIS — M7552 Bursitis of left shoulder: Secondary | ICD-10-CM | POA: Insufficient documentation

## 2021-09-27 DIAGNOSIS — S161XXA Strain of muscle, fascia and tendon at neck level, initial encounter: Secondary | ICD-10-CM | POA: Diagnosis not present

## 2021-09-27 DIAGNOSIS — M25552 Pain in left hip: Secondary | ICD-10-CM | POA: Insufficient documentation

## 2021-09-27 DIAGNOSIS — I959 Hypotension, unspecified: Secondary | ICD-10-CM | POA: Diagnosis not present

## 2021-09-27 DIAGNOSIS — M542 Cervicalgia: Secondary | ICD-10-CM | POA: Insufficient documentation

## 2021-09-27 DIAGNOSIS — J449 Chronic obstructive pulmonary disease, unspecified: Secondary | ICD-10-CM | POA: Insufficient documentation

## 2021-09-27 DIAGNOSIS — E119 Type 2 diabetes mellitus without complications: Secondary | ICD-10-CM | POA: Insufficient documentation

## 2021-09-27 DIAGNOSIS — Z7982 Long term (current) use of aspirin: Secondary | ICD-10-CM | POA: Diagnosis not present

## 2021-09-27 DIAGNOSIS — Z7901 Long term (current) use of anticoagulants: Secondary | ICD-10-CM | POA: Insufficient documentation

## 2021-09-27 DIAGNOSIS — S199XXA Unspecified injury of neck, initial encounter: Secondary | ICD-10-CM | POA: Diagnosis not present

## 2021-09-27 DIAGNOSIS — W19XXXA Unspecified fall, initial encounter: Secondary | ICD-10-CM | POA: Diagnosis not present

## 2021-09-27 DIAGNOSIS — R0789 Other chest pain: Secondary | ICD-10-CM | POA: Diagnosis not present

## 2021-09-27 LAB — CBC WITH DIFFERENTIAL/PLATELET
Abs Immature Granulocytes: 0.04 10*3/uL (ref 0.00–0.07)
Basophils Absolute: 0.1 10*3/uL (ref 0.0–0.1)
Basophils Relative: 1 %
Eosinophils Absolute: 0.5 10*3/uL (ref 0.0–0.5)
Eosinophils Relative: 4 %
HCT: 42.4 % (ref 39.0–52.0)
Hemoglobin: 14 g/dL (ref 13.0–17.0)
Immature Granulocytes: 0 %
Lymphocytes Relative: 24 %
Lymphs Abs: 3.1 10*3/uL (ref 0.7–4.0)
MCH: 29.7 pg (ref 26.0–34.0)
MCHC: 33 g/dL (ref 30.0–36.0)
MCV: 90 fL (ref 80.0–100.0)
Monocytes Absolute: 1 10*3/uL (ref 0.1–1.0)
Monocytes Relative: 8 %
Neutro Abs: 8.1 10*3/uL — ABNORMAL HIGH (ref 1.7–7.7)
Neutrophils Relative %: 63 %
Platelets: 240 10*3/uL (ref 150–400)
RBC: 4.71 MIL/uL (ref 4.22–5.81)
RDW: 14.1 % (ref 11.5–15.5)
WBC: 12.8 10*3/uL — ABNORMAL HIGH (ref 4.0–10.5)
nRBC: 0 % (ref 0.0–0.2)

## 2021-09-27 LAB — BASIC METABOLIC PANEL
Anion gap: 11 (ref 5–15)
BUN: 22 mg/dL — ABNORMAL HIGH (ref 6–20)
CO2: 26 mmol/L (ref 22–32)
Calcium: 9.4 mg/dL (ref 8.9–10.3)
Chloride: 103 mmol/L (ref 98–111)
Creatinine, Ser: 1.47 mg/dL — ABNORMAL HIGH (ref 0.61–1.24)
GFR, Estimated: 57 mL/min — ABNORMAL LOW (ref >60)
Glucose, Bld: 99 mg/dL (ref 70–99)
Potassium: 3.6 mmol/L (ref 3.5–5.1)
Sodium: 140 mmol/L (ref 135–145)

## 2021-09-27 LAB — TROPONIN I (HIGH SENSITIVITY): Troponin I (High Sensitivity): 8 ng/L (ref <18)

## 2021-09-27 NOTE — Discharge Instructions (Signed)
Continue medications as previously prescribed.  Follow-up with primary doctor in the next few days. 

## 2021-09-27 NOTE — ED Triage Notes (Signed)
Pt fell tonight trying to get out of his wheelchair at home. Pt paralyzed on left side. Pt c/o left hip pain, neck pain, and abrasion to left side of forehead.  ?

## 2021-09-27 NOTE — ED Notes (Signed)
Pt wife contact and she is on way to pick up patient. ?

## 2021-09-27 NOTE — ED Provider Notes (Signed)
?[facility] ?Provider Note ? ? ?CSN: [id number] ?Arrival date & time: 09/27/21  0217 ? ?  ? ?History ? ?Chief Complaint  ?Patient presents with  ? Fall  ? ? ?[name] is a 56 y.o. male. ? ?Patient is a 56 year old male with past medical history of hemiplegia secondary to prior stroke, coronary artery disease, non-STEMI, COPD, hypertension, diabetes.  Patient presenting today for evaluation of fall.  Patient fell earlier this evening, then needed EMS assistance to get off the floor.  He fell a second time, striking his head.  He denies loss of consciousness, but complains of pain in his head, neck, and left hip. ? ?Upon my evaluation of the patient, he tells me that chest pain is what caused him to fall.  He is unable to elaborate further. ? ?The history is provided by the patient.  ? ?  ? ?Home Medications ?Prior to Admission medications   ?Medication Sig Start Date End Date Taking? Authorizing Provider  ?acetaminophen (TYLENOL) 500 MG tablet Take 1,000 mg by mouth every 6 (six) hours as needed for moderate pain or headache.    [provider]  ?albuterol (PROVENTIL HFA;VENTOLIN HFA) 108 (90 BASE) MCG/ACT inhaler Inhale 2 puffs into the lungs every 6 (six) hours as needed for wheezing or shortness of breath.    [provider]  ?ALPRAZolam Duanne Moron) 1 MG tablet Take 1 mg by mouth 2 (two) times daily as needed.    [provider]  ?aspirin EC 81 MG tablet Take 1 tablet (81 mg total) by mouth daily. 03/04/18   Satira Sark, MD  ?atorvastatin (LIPITOR) 40 MG tablet TAKE 1 TABLET ONCE DAILY. 06/25/21   Satira Sark, MD  ?baclofen (LIORESAL) 10 MG tablet Take by mouth. ?Patient not taking: Reported on 07/18/2021 08/27/17   [provider]  ?baclofen (LIORESAL) 20 MG tablet Take 1 tablet (20 mg total) by mouth 2 (two) times daily. ?Patient not taking: Reported on 07/18/2021 05/19/20   Charlett Blake, MD  ?carvedilol (COREG) 3.125 MG tablet Take  3.125 mg by mouth 2 (two) times daily. 06/16/21   [provider]  ?Cholecalciferol (VITAMIN D3) 50 MCG (2000 UT) CAPS Take by mouth.    [provider]  ?clopidogrel (PLAVIX) 75 MG tablet  04/24/21   [provider]  ?dapsone 25 MG tablet Take by mouth.    [provider]  ?diltiazem (CARDIZEM CD) 240 MG 24 hr capsule Take 240 mg by mouth daily.  03/14/20   [provider]  ?diltiazem (TIAZAC) 120 MG 24 hr capsule Take 1 capsule by mouth daily. 06/15/21   [provider]  ?docusate sodium (COLACE) 100 MG capsule Take 1 capsule (100 mg total) by mouth 2 (two) times daily. 04/04/21   Debbrah Alar, PA-C  ?DULoxetine (CYMBALTA) 60 MG capsule Take 1 capsule (60 mg total) by mouth daily. 09/25/21 12/24/21  [name], MD  ?ENTRESTO 49-51 MG TAKE (1) TABLET TWICE DAILY. 04/24/21   Satira Sark, MD  ?fenofibrate micronized (LOFIBRA) 134 MG capsule Take 134 mg by mouth daily before breakfast.    [provider]  ?fluticasone (CUTIVATE) 0.05 % cream Apply 1 application topically 2 (two) times daily.    [provider]  ?fluticasone (FLONASE) 50 MCG/ACT nasal spray 2 sprays into each nostril daily.    [provider]  ?furosemide (LASIX) 20 MG tablet Take 20 mg by mouth daily.     [provider]  ?guaiFENesin (  ROBITUSSIN) 100 MG/5ML liquid Take by mouth. 06/15/21   [provider]  ?HYDROcodone-acetaminophen (NORCO/VICODIN) 5-325 MG tablet Take by mouth. ?Patient not taking: Reported on 07/18/2021    [provider]  ?isosorbide mononitrate (IMDUR) 30 MG 24 hr tablet Take 1 tablet by mouth daily. 06/15/21   [provider]  ?isosorbide mononitrate (IMDUR) 60 MG 24 hr tablet Take 60 mg by mouth 2 (two) times daily.     [provider]  ?levofloxacin (LEVAQUIN) 750 MG tablet Take 750 mg by mouth daily.    [provider]  ?lisinopril (ZESTRIL) 5 MG tablet Take 1 tablet by mouth daily. 06/16/21    [provider]  ?naloxone (NARCAN) nasal spray 4 mg/0.1 mL Place 1 spray into the nose as needed (opioid overdose). 11/23/20   [provider]  ?nicotine polacrilex (NICORETTE) 4 MG gum Place inside cheek.    [provider]  ?nitroGLYCERIN (NITROSTAT) 0.4 MG SL tablet Place 0.4 mg under the tongue every 5 (five) minutes as needed. For chest pain    [provider]  ?ondansetron (ZOFRAN) 8 MG tablet Take 8 mg by mouth 3 (three) times daily. 07/02/21   [provider]  ?oxyCODONE-acetaminophen (PERCOCET) 7.5-325 MG tablet Take 1 tablet by mouth every 6 (six) hours as needed for moderate pain. 04/04/21   Debbrah Alar, PA-C  ?pregabalin (LYRICA) 75 MG capsule TAKE 1 CAPSULE BY MOUTH TWICE DAILY. 07/30/21   Pieter Partridge, DO  ?Semaglutide, 1 MG/DOSE, (OZEMPIC, 1 MG/DOSE,) 4 MG/3ML SOPN Inject into the skin.    [provider]  ?silver sulfADIAZINE (SILVADENE) 1 % cream Apply 1 application topically 3 (three) times daily. 02/17/20   [provider]  ?tamsulosin (FLOMAX) 0.4 MG CAPS capsule Take 0.4 mg by mouth.    [provider]  ?tiZANidine (ZANAFLEX) 2 MG tablet Take by mouth every 6 (six) hours as needed for muscle spasms.    [provider]  ?traZODone (DESYREL) 50 MG tablet Take 50 mg by mouth at bedtime.    [provider]  ?triamcinolone cream (KENALOG) 0.1 % Apply 1 application topically 2 (two) times daily.    [provider]  ?varenicline (CHANTIX) 1 MG tablet Take by mouth.    [provider]  ?   ? ?Allergies    ?Ibuprofen, Iodinated contrast media, Nsaids, Ace inhibitors, Chantix [varenicline], Other, and Pollen extract   ? ?Review of Systems   ?Review of Systems  ?All other systems reviewed and are negative. ? ?Physical Exam ?Updated Vital Signs ?BP (!) 87/64   Pulse 76   Temp 98.6 ?F (37 ?C) (Oral)   Resp 16   Ht '5\' 11"'$  (1.803 m)   Wt 99.3 kg   SpO2 90%   BMI 30.53 kg/m?  ?Physical Exam ?Vitals  and nursing note reviewed.  ?Constitutional:   ?   General: He is not in acute distress. ?   Appearance: He is well-developed. He is not diaphoretic.  ?HENT:  ?   Head: Normocephalic and atraumatic.  ?   Comments: There is an abrasion to the forehead, but no significant swelling or palpable defect. ?Neck:  ?   Comments: There is tenderness to palpation in the soft tissues of the cervical region.  There is no bony tenderness or step-off. ?Cardiovascular:  ?   Rate and Rhythm: Normal rate and regular rhythm.  ?   Heart sounds: No murmur heard. ?  No friction rub.  ?Pulmonary:  ?   Effort:  Pulmonary effort is normal. No respiratory distress.  ?   Breath sounds: Normal breath sounds. No wheezing or rales.  ?Abdominal:  ?   General: Bowel sounds are normal. There is no distension.  ?   Palpations: Abdomen is soft.  ?   Tenderness: There is no abdominal tenderness.  ?Musculoskeletal:     ?   General: Normal range of motion.  ?   Cervical back: Normal range of motion and neck supple.  ?   Comments: There is tenderness to palpation over the lateral left hip, but no palpable abnormality or deformity.  There is no shortening or rotation of the leg.  ?Skin: ?   General: Skin is warm and dry.  ?Neurological:  ?   Mental Status: He is alert and oriented to person, place, and time.  ?   Coordination: Coordination normal.  ?   Comments: Baseline left hemiparesis is noted.  ? ? ?ED Results / Procedures / Treatments   ?Labs ?(all labs ordered are listed, but only abnormal results are displayed) ?Labs Reviewed  ?BASIC METABOLIC PANEL  ?CBC WITH DIFFERENTIAL/PLATELET  ?TROPONIN I (HIGH SENSITIVITY)  ? ? ?EKG ?EKG Interpretation ? ?Date/Time:  Thursday September 27 2021 04:34:01 EDT ?Ventricular Rate:  69 ?PR Interval:  136 ?QRS Duration: 90 ?QT Interval:  429 ?QTC Calculation: 460 ?R Axis:   55 ?Text Interpretation: Sinus rhythm Low voltage, precordial leads Anteroseptal infarct, old Nonspecific T abnormalities, lateral leads Confirmed by  Veryl Speak [phone]) on 09/27/2021 4:39:51 AM ? ?Radiology ?CT Head Wo Contrast ? ?Result Date: 09/27/2021 ?CLINICAL DATA:  Head trauma. EXAM: CT HEAD WITHOUT CONTRAST CT CERVICAL SPINE WITHOUT CONTRAST TECHN

## 2021-09-27 NOTE — Progress Notes (Signed)
Shoulder injectionLeft subacromial  ?Without  ultrasound guidance ? ?Indication:Left Shoulder pain not relieved by medication management and other conservative care. ? ?Informed consent was obtained after describing risks and benefits of the procedure with the patient, this includes bleeding, bruising, infection and medication side effects. The patient wishes to proceed and has given written consent. Patient was placed in a seated position. TheLeft shoulder was marked and prepped with betadine in the subacromial area. A 25-gauge 1-1/2 inch needle was inserted into the subacromial area. After negative draw back for blood, a solution containing 1 mL of 6 mg per ML betamethasone and 4 mL of 1% lidocaine was injected. A band aid was applied. The patient tolerated the procedure well. Post procedure instructions were given. ? ?Pt c/o excess relaxation of Left knee flexor , as well as 2nd and 3rd digits ?Will reduce dysport ot 1000U  ? ?

## 2021-09-28 DIAGNOSIS — S0081XA Abrasion of other part of head, initial encounter: Secondary | ICD-10-CM | POA: Diagnosis not present

## 2021-09-28 DIAGNOSIS — W050XXA Fall from non-moving wheelchair, initial encounter: Secondary | ICD-10-CM | POA: Diagnosis not present

## 2021-09-28 DIAGNOSIS — I110 Hypertensive heart disease with heart failure: Secondary | ICD-10-CM | POA: Diagnosis not present

## 2021-09-28 DIAGNOSIS — R457 State of emotional shock and stress, unspecified: Secondary | ICD-10-CM | POA: Diagnosis not present

## 2021-09-28 DIAGNOSIS — S3991XA Unspecified injury of abdomen, initial encounter: Secondary | ICD-10-CM | POA: Diagnosis not present

## 2021-09-28 DIAGNOSIS — J449 Chronic obstructive pulmonary disease, unspecified: Secondary | ICD-10-CM | POA: Diagnosis not present

## 2021-09-28 DIAGNOSIS — R609 Edema, unspecified: Secondary | ICD-10-CM | POA: Diagnosis not present

## 2021-09-28 DIAGNOSIS — I509 Heart failure, unspecified: Secondary | ICD-10-CM | POA: Diagnosis not present

## 2021-09-28 DIAGNOSIS — I69354 Hemiplegia and hemiparesis following cerebral infarction affecting left non-dominant side: Secondary | ICD-10-CM | POA: Diagnosis not present

## 2021-09-28 DIAGNOSIS — Z955 Presence of coronary angioplasty implant and graft: Secondary | ICD-10-CM | POA: Diagnosis not present

## 2021-09-28 DIAGNOSIS — W19XXXA Unspecified fall, initial encounter: Secondary | ICD-10-CM | POA: Diagnosis not present

## 2021-09-28 DIAGNOSIS — E119 Type 2 diabetes mellitus without complications: Secondary | ICD-10-CM | POA: Diagnosis not present

## 2021-09-28 DIAGNOSIS — S40912A Unspecified superficial injury of left shoulder, initial encounter: Secondary | ICD-10-CM | POA: Diagnosis not present

## 2021-09-28 DIAGNOSIS — Z043 Encounter for examination and observation following other accident: Secondary | ICD-10-CM | POA: Diagnosis not present

## 2021-09-28 DIAGNOSIS — M2578 Osteophyte, vertebrae: Secondary | ICD-10-CM | POA: Diagnosis not present

## 2021-09-28 DIAGNOSIS — S098XXA Other specified injuries of head, initial encounter: Secondary | ICD-10-CM | POA: Diagnosis not present

## 2021-09-28 DIAGNOSIS — R410 Disorientation, unspecified: Secondary | ICD-10-CM | POA: Diagnosis not present

## 2021-09-28 DIAGNOSIS — S299XXA Unspecified injury of thorax, initial encounter: Secondary | ICD-10-CM | POA: Diagnosis not present

## 2021-09-28 DIAGNOSIS — R52 Pain, unspecified: Secondary | ICD-10-CM | POA: Diagnosis not present

## 2021-10-01 DIAGNOSIS — B370 Candidal stomatitis: Secondary | ICD-10-CM | POA: Diagnosis not present

## 2021-10-01 DIAGNOSIS — F17210 Nicotine dependence, cigarettes, uncomplicated: Secondary | ICD-10-CM | POA: Diagnosis not present

## 2021-10-01 DIAGNOSIS — J029 Acute pharyngitis, unspecified: Secondary | ICD-10-CM | POA: Diagnosis not present

## 2021-10-01 DIAGNOSIS — Z6831 Body mass index (BMI) 31.0-31.9, adult: Secondary | ICD-10-CM | POA: Diagnosis not present

## 2021-10-04 DIAGNOSIS — L304 Erythema intertrigo: Secondary | ICD-10-CM | POA: Diagnosis not present

## 2021-10-04 DIAGNOSIS — L732 Hidradenitis suppurativa: Secondary | ICD-10-CM | POA: Diagnosis not present

## 2021-10-11 ENCOUNTER — Encounter: Payer: Self-pay | Admitting: Physical Medicine & Rehabilitation

## 2021-10-20 ENCOUNTER — Other Ambulatory Visit: Payer: Self-pay | Admitting: Cardiology

## 2021-10-20 DIAGNOSIS — H6691 Otitis media, unspecified, right ear: Secondary | ICD-10-CM | POA: Diagnosis not present

## 2021-10-20 DIAGNOSIS — I10 Essential (primary) hypertension: Secondary | ICD-10-CM | POA: Diagnosis not present

## 2021-10-20 DIAGNOSIS — R42 Dizziness and giddiness: Secondary | ICD-10-CM | POA: Diagnosis not present

## 2021-10-20 DIAGNOSIS — J449 Chronic obstructive pulmonary disease, unspecified: Secondary | ICD-10-CM | POA: Diagnosis not present

## 2021-10-20 DIAGNOSIS — G8102 Flaccid hemiplegia affecting left dominant side: Secondary | ICD-10-CM | POA: Diagnosis not present

## 2021-10-20 DIAGNOSIS — F17210 Nicotine dependence, cigarettes, uncomplicated: Secondary | ICD-10-CM | POA: Diagnosis not present

## 2021-10-20 DIAGNOSIS — I25119 Atherosclerotic heart disease of native coronary artery with unspecified angina pectoris: Secondary | ICD-10-CM | POA: Diagnosis not present

## 2021-10-20 DIAGNOSIS — G6289 Other specified polyneuropathies: Secondary | ICD-10-CM | POA: Diagnosis not present

## 2021-10-20 DIAGNOSIS — N1831 Chronic kidney disease, stage 3a: Secondary | ICD-10-CM | POA: Diagnosis not present

## 2021-10-20 DIAGNOSIS — E119 Type 2 diabetes mellitus without complications: Secondary | ICD-10-CM | POA: Diagnosis not present

## 2021-10-21 DIAGNOSIS — R4189 Other symptoms and signs involving cognitive functions and awareness: Secondary | ICD-10-CM | POA: Diagnosis not present

## 2021-10-21 DIAGNOSIS — E119 Type 2 diabetes mellitus without complications: Secondary | ICD-10-CM | POA: Diagnosis not present

## 2021-10-21 DIAGNOSIS — Z20822 Contact with and (suspected) exposure to covid-19: Secondary | ICD-10-CM | POA: Diagnosis not present

## 2021-10-21 DIAGNOSIS — I5189 Other ill-defined heart diseases: Secondary | ICD-10-CM | POA: Diagnosis not present

## 2021-10-21 DIAGNOSIS — R5381 Other malaise: Secondary | ICD-10-CM | POA: Diagnosis not present

## 2021-10-21 DIAGNOSIS — E1165 Type 2 diabetes mellitus with hyperglycemia: Secondary | ICD-10-CM | POA: Diagnosis not present

## 2021-10-21 DIAGNOSIS — Z8673 Personal history of transient ischemic attack (TIA), and cerebral infarction without residual deficits: Secondary | ICD-10-CM | POA: Diagnosis not present

## 2021-10-21 DIAGNOSIS — I6503 Occlusion and stenosis of bilateral vertebral arteries: Secondary | ICD-10-CM | POA: Diagnosis not present

## 2021-10-21 DIAGNOSIS — A419 Sepsis, unspecified organism: Secondary | ICD-10-CM | POA: Diagnosis not present

## 2021-10-21 DIAGNOSIS — G4089 Other seizures: Secondary | ICD-10-CM | POA: Diagnosis not present

## 2021-10-21 DIAGNOSIS — J9600 Acute respiratory failure, unspecified whether with hypoxia or hypercapnia: Secondary | ICD-10-CM | POA: Diagnosis not present

## 2021-10-21 DIAGNOSIS — J9811 Atelectasis: Secondary | ICD-10-CM | POA: Diagnosis not present

## 2021-10-21 DIAGNOSIS — J155 Pneumonia due to Escherichia coli: Secondary | ICD-10-CM | POA: Diagnosis not present

## 2021-10-21 DIAGNOSIS — I110 Hypertensive heart disease with heart failure: Secondary | ICD-10-CM | POA: Diagnosis not present

## 2021-10-21 DIAGNOSIS — G9389 Other specified disorders of brain: Secondary | ICD-10-CM | POA: Diagnosis not present

## 2021-10-21 DIAGNOSIS — R29818 Other symptoms and signs involving the nervous system: Secondary | ICD-10-CM | POA: Diagnosis not present

## 2021-10-21 DIAGNOSIS — R579 Shock, unspecified: Secondary | ICD-10-CM | POA: Diagnosis not present

## 2021-10-21 DIAGNOSIS — R0600 Dyspnea, unspecified: Secondary | ICD-10-CM | POA: Diagnosis not present

## 2021-10-21 DIAGNOSIS — G459 Transient cerebral ischemic attack, unspecified: Secondary | ICD-10-CM | POA: Diagnosis not present

## 2021-10-21 DIAGNOSIS — R296 Repeated falls: Secondary | ICD-10-CM | POA: Diagnosis not present

## 2021-10-21 DIAGNOSIS — R52 Pain, unspecified: Secondary | ICD-10-CM | POA: Diagnosis not present

## 2021-10-21 DIAGNOSIS — J9690 Respiratory failure, unspecified, unspecified whether with hypoxia or hypercapnia: Secondary | ICD-10-CM | POA: Diagnosis not present

## 2021-10-21 DIAGNOSIS — Z720 Tobacco use: Secondary | ICD-10-CM | POA: Diagnosis not present

## 2021-10-21 DIAGNOSIS — I429 Cardiomyopathy, unspecified: Secondary | ICD-10-CM | POA: Diagnosis not present

## 2021-10-21 DIAGNOSIS — R4182 Altered mental status, unspecified: Secondary | ICD-10-CM | POA: Diagnosis not present

## 2021-10-21 DIAGNOSIS — G9341 Metabolic encephalopathy: Secondary | ICD-10-CM | POA: Diagnosis not present

## 2021-10-21 DIAGNOSIS — K6389 Other specified diseases of intestine: Secondary | ICD-10-CM | POA: Diagnosis not present

## 2021-10-21 DIAGNOSIS — Z951 Presence of aortocoronary bypass graft: Secondary | ICD-10-CM | POA: Diagnosis not present

## 2021-10-21 DIAGNOSIS — E43 Unspecified severe protein-calorie malnutrition: Secondary | ICD-10-CM | POA: Diagnosis not present

## 2021-10-21 DIAGNOSIS — I6523 Occlusion and stenosis of bilateral carotid arteries: Secondary | ICD-10-CM | POA: Diagnosis not present

## 2021-10-21 DIAGNOSIS — R110 Nausea: Secondary | ICD-10-CM | POA: Diagnosis not present

## 2021-10-21 DIAGNOSIS — I351 Nonrheumatic aortic (valve) insufficiency: Secondary | ICD-10-CM | POA: Diagnosis not present

## 2021-10-21 DIAGNOSIS — G40901 Epilepsy, unspecified, not intractable, with status epilepticus: Secondary | ICD-10-CM | POA: Diagnosis not present

## 2021-10-21 DIAGNOSIS — I10 Essential (primary) hypertension: Secondary | ICD-10-CM | POA: Diagnosis not present

## 2021-10-21 DIAGNOSIS — Z515 Encounter for palliative care: Secondary | ICD-10-CM | POA: Diagnosis not present

## 2021-10-21 DIAGNOSIS — I2510 Atherosclerotic heart disease of native coronary artery without angina pectoris: Secondary | ICD-10-CM | POA: Diagnosis not present

## 2021-10-21 DIAGNOSIS — R918 Other nonspecific abnormal finding of lung field: Secondary | ICD-10-CM | POA: Diagnosis not present

## 2021-10-21 DIAGNOSIS — I5023 Acute on chronic systolic (congestive) heart failure: Secondary | ICD-10-CM | POA: Diagnosis not present

## 2021-10-21 DIAGNOSIS — E872 Acidosis, unspecified: Secondary | ICD-10-CM | POA: Diagnosis not present

## 2021-10-21 DIAGNOSIS — I700 Atherosclerosis of aorta: Secondary | ICD-10-CM | POA: Diagnosis not present

## 2021-10-21 DIAGNOSIS — F1390 Sedative, hypnotic, or anxiolytic use, unspecified, uncomplicated: Secondary | ICD-10-CM | POA: Diagnosis not present

## 2021-10-21 DIAGNOSIS — I5022 Chronic systolic (congestive) heart failure: Secondary | ICD-10-CM | POA: Diagnosis not present

## 2021-10-21 DIAGNOSIS — I4891 Unspecified atrial fibrillation: Secondary | ICD-10-CM | POA: Diagnosis not present

## 2021-10-21 DIAGNOSIS — I672 Cerebral atherosclerosis: Secondary | ICD-10-CM | POA: Diagnosis not present

## 2021-10-21 DIAGNOSIS — J9809 Other diseases of bronchus, not elsewhere classified: Secondary | ICD-10-CM | POA: Diagnosis not present

## 2021-10-21 DIAGNOSIS — Z7401 Bed confinement status: Secondary | ICD-10-CM | POA: Diagnosis not present

## 2021-10-21 DIAGNOSIS — I517 Cardiomegaly: Secondary | ICD-10-CM | POA: Diagnosis not present

## 2021-10-21 DIAGNOSIS — I358 Other nonrheumatic aortic valve disorders: Secondary | ICD-10-CM | POA: Diagnosis not present

## 2021-10-21 DIAGNOSIS — Z4682 Encounter for fitting and adjustment of non-vascular catheter: Secondary | ICD-10-CM | POA: Diagnosis not present

## 2021-10-21 DIAGNOSIS — J984 Other disorders of lung: Secondary | ICD-10-CM | POA: Diagnosis not present

## 2021-10-21 DIAGNOSIS — G40411 Other generalized epilepsy and epileptic syndromes, intractable, with status epilepticus: Secondary | ICD-10-CM | POA: Diagnosis not present

## 2021-10-21 DIAGNOSIS — J811 Chronic pulmonary edema: Secondary | ICD-10-CM | POA: Diagnosis not present

## 2021-10-21 DIAGNOSIS — R4020 Unspecified coma: Secondary | ICD-10-CM | POA: Diagnosis not present

## 2021-10-21 DIAGNOSIS — R0602 Shortness of breath: Secondary | ICD-10-CM | POA: Diagnosis not present

## 2021-10-21 DIAGNOSIS — J439 Emphysema, unspecified: Secondary | ICD-10-CM | POA: Diagnosis not present

## 2021-10-21 DIAGNOSIS — F32A Depression, unspecified: Secondary | ICD-10-CM | POA: Diagnosis not present

## 2021-10-21 DIAGNOSIS — I639 Cerebral infarction, unspecified: Secondary | ICD-10-CM | POA: Diagnosis not present

## 2021-10-21 DIAGNOSIS — A86 Unspecified viral encephalitis: Secondary | ICD-10-CM | POA: Diagnosis not present

## 2021-10-21 DIAGNOSIS — Z66 Do not resuscitate: Secondary | ICD-10-CM | POA: Diagnosis not present

## 2021-10-21 DIAGNOSIS — J9601 Acute respiratory failure with hypoxia: Secondary | ICD-10-CM | POA: Diagnosis not present

## 2021-10-21 DIAGNOSIS — G9340 Encephalopathy, unspecified: Secondary | ICD-10-CM | POA: Diagnosis not present

## 2021-10-21 DIAGNOSIS — I69354 Hemiplegia and hemiparesis following cerebral infarction affecting left non-dominant side: Secondary | ICD-10-CM | POA: Diagnosis not present

## 2021-10-21 DIAGNOSIS — N179 Acute kidney failure, unspecified: Secondary | ICD-10-CM | POA: Diagnosis not present

## 2021-10-21 DIAGNOSIS — Z452 Encounter for adjustment and management of vascular access device: Secondary | ICD-10-CM | POA: Diagnosis not present

## 2021-10-21 DIAGNOSIS — I483 Typical atrial flutter: Secondary | ICD-10-CM | POA: Diagnosis not present

## 2021-10-21 DIAGNOSIS — J449 Chronic obstructive pulmonary disease, unspecified: Secondary | ICD-10-CM | POA: Diagnosis not present

## 2021-10-21 DIAGNOSIS — G319 Degenerative disease of nervous system, unspecified: Secondary | ICD-10-CM | POA: Diagnosis not present

## 2021-10-21 DIAGNOSIS — R40243 Glasgow coma scale score 3-8, unspecified time: Secondary | ICD-10-CM | POA: Diagnosis not present

## 2021-10-21 DIAGNOSIS — I959 Hypotension, unspecified: Secondary | ICD-10-CM | POA: Diagnosis not present

## 2021-10-21 DIAGNOSIS — J90 Pleural effusion, not elsewhere classified: Secondary | ICD-10-CM | POA: Diagnosis not present

## 2021-10-21 DIAGNOSIS — N401 Enlarged prostate with lower urinary tract symptoms: Secondary | ICD-10-CM | POA: Diagnosis not present

## 2021-10-21 DIAGNOSIS — I6381 Other cerebral infarction due to occlusion or stenosis of small artery: Secondary | ICD-10-CM | POA: Diagnosis not present

## 2021-10-21 DIAGNOSIS — R404 Transient alteration of awareness: Secondary | ICD-10-CM | POA: Diagnosis not present

## 2021-10-22 ENCOUNTER — Other Ambulatory Visit: Payer: Self-pay | Admitting: Cardiology

## 2021-10-22 DIAGNOSIS — I700 Atherosclerosis of aorta: Secondary | ICD-10-CM | POA: Diagnosis not present

## 2021-10-22 DIAGNOSIS — A419 Sepsis, unspecified organism: Secondary | ICD-10-CM | POA: Diagnosis not present

## 2021-10-22 DIAGNOSIS — J9811 Atelectasis: Secondary | ICD-10-CM | POA: Diagnosis not present

## 2021-10-22 DIAGNOSIS — J439 Emphysema, unspecified: Secondary | ICD-10-CM | POA: Diagnosis not present

## 2021-10-22 DIAGNOSIS — K6389 Other specified diseases of intestine: Secondary | ICD-10-CM | POA: Diagnosis not present

## 2021-10-22 DIAGNOSIS — J9809 Other diseases of bronchus, not elsewhere classified: Secondary | ICD-10-CM | POA: Diagnosis not present

## 2021-10-22 DIAGNOSIS — R4182 Altered mental status, unspecified: Secondary | ICD-10-CM | POA: Diagnosis not present

## 2021-10-22 DIAGNOSIS — R296 Repeated falls: Secondary | ICD-10-CM | POA: Diagnosis not present

## 2021-11-08 ENCOUNTER — Encounter: Payer: Medicare Other | Admitting: Physical Medicine & Rehabilitation

## 2021-11-09 ENCOUNTER — Encounter: Payer: Self-pay | Admitting: Physical Medicine & Rehabilitation

## 2021-11-09 ENCOUNTER — Encounter: Payer: Medicare Other | Attending: Physical Medicine & Rehabilitation | Admitting: Physical Medicine & Rehabilitation

## 2021-11-09 VITALS — BP 99/61 | HR 82 | Ht 71.0 in | Wt 170.0 lb

## 2021-11-09 DIAGNOSIS — G8110 Spastic hemiplegia affecting unspecified side: Secondary | ICD-10-CM | POA: Diagnosis not present

## 2021-11-09 DIAGNOSIS — G8114 Spastic hemiplegia affecting left nondominant side: Secondary | ICD-10-CM | POA: Diagnosis not present

## 2021-11-09 NOTE — Progress Notes (Signed)
Dysport Injection for spasticity using needle EMG guidance ? ?Dilution: 200 Units/ml ?Indication: Severe spasticity which interferes with ADL,mobility and/or  hygiene and is unresponsive to medication management and other conservative care ?Informed consent was obtained after describing risks and benefits of the procedure with the patient. This includes bleeding, bruising, infection, excessive weakness, or medication side effects. A REMS form is on file and signed. ?Needle:  needle electrode ?Number of units per muscle ?LUE- 100U total ?FDS 100-  ?FDP 100- ?  ?Biceps 200 ? Left FPL 100 ?  ?LLE ?  ?Med  gastroc 200U ? ?Tibialis post  100U- ?  ?Soleus  200U ?All injections were done after obtaining appropriate EMG activity and after negative drawback for blood. The patient tolerated the procedure well. Post procedure instructions were given. A followup appointment was made.  ? ?

## 2021-11-09 NOTE — Patient Instructions (Signed)

## 2021-11-12 ENCOUNTER — Telehealth: Payer: Self-pay | Admitting: Psychiatry

## 2021-11-12 NOTE — Telephone Encounter (Signed)
Noted, thanks!

## 2021-11-12 NOTE — Telephone Encounter (Signed)
Patient's caregiver [name] called requesting confirmation of next appointment with provider. No ROI on file for her so requested to speak with patient. Confirmed his identity with 3 identifiers and confirmed appointment as requested. During review of demographics he stated he wanted his now estranged wife [name] to no longer have any information about him or be spoken with about him. Made this notation on the Appointment Desk Comments. He also requested that the caregiver [name] be permitted to speak with [facility] on his behalf. Informed patient of documentation to be entered regarding this and that he would need to sign a ROI and/or revise his DPR on his next visit to a [place] facility/practice. ?

## 2021-11-14 DIAGNOSIS — Z0100 Encounter for examination of eyes and vision without abnormal findings: Secondary | ICD-10-CM | POA: Diagnosis not present

## 2021-11-14 DIAGNOSIS — E119 Type 2 diabetes mellitus without complications: Secondary | ICD-10-CM | POA: Diagnosis not present

## 2021-11-15 DIAGNOSIS — K2100 Gastro-esophageal reflux disease with esophagitis, without bleeding: Secondary | ICD-10-CM | POA: Diagnosis not present

## 2021-11-15 DIAGNOSIS — I10 Essential (primary) hypertension: Secondary | ICD-10-CM | POA: Diagnosis not present

## 2021-11-15 DIAGNOSIS — E782 Mixed hyperlipidemia: Secondary | ICD-10-CM | POA: Diagnosis not present

## 2021-11-15 DIAGNOSIS — E119 Type 2 diabetes mellitus without complications: Secondary | ICD-10-CM | POA: Diagnosis not present

## 2021-11-15 DIAGNOSIS — E7849 Other hyperlipidemia: Secondary | ICD-10-CM | POA: Diagnosis not present

## 2021-11-15 DIAGNOSIS — Z1329 Encounter for screening for other suspected endocrine disorder: Secondary | ICD-10-CM | POA: Diagnosis not present

## 2021-11-18 ENCOUNTER — Other Ambulatory Visit: Payer: Self-pay | Admitting: Cardiology

## 2021-11-20 NOTE — Progress Notes (Signed)
Virtual Visit via Video Note ? ?I connected with [name] on 11/22/21 at  8:40 AM EDT by a video enabled telemedicine application and verified that I am speaking with the correct person using two identifiers. ? ?Location: ?Patient: home ?Provider: office ?Persons participated in the visit- patient, provider  ?  ?I discussed the limitations of evaluation and management by telemedicine and the availability of in person appointments. The patient expressed understanding and agreed to proceed. ? ?I discussed the assessment and treatment plan with the patient. The patient was provided an opportunity to ask questions and all were answered. The patient agreed with the plan and demonstrated an understanding of the instructions. ?  ?The patient was advised to call back or seek an in-person evaluation if the symptoms worsen or if the condition fails to improve as anticipated. ? ?I provided 16 minutes of non-face-to-face time during this encounter. ? ? ?[name], MD ? ? ? ?BH MD/PA/NP OP Progress Note ? ?11/22/2021 9:13 AM ?[name]  ?MRN:  [id number] ? ?Chief Complaint: No chief complaint on file. ? ?HPI:  ?Per chart review, he was admitted for altered mental status in the setting of hypoxemia. Dx includes Acute hypoxic respiratory failure; Mucous plugging bilaterally ; E.coli pneumonia, COPD w/ emphysema ?"Around 530a he was found in the floor after having apparently fallen out of his W/C while trying to get to bed. Pt's nephew (Ms Madelin Rear son) helps take care of him on the weekends. Helped him to bed at which point Mr Stahl seemed very sleepy. Tried to wake him up around [place] and was unable, so called pt's wife because she is a Marine scientist. They moved him from bed to the couch at which point he would open his eyes and moan but not much else. The nephew called 42."  ? ?This is a follow-up appointment for depression.  ?He states that he is now at home with hospice, who visits him twice a week.  He is unsure  why he has hospice.  Although he cannot tell why he was admitted, he states that he was on life-support.  He has good support from his sister, who almost visits him every day.  He continues to have a home aide.  He is in the bed most of the time.  Although he has not been able to go outside due to his physical condition, he thinks he has been managing things well.  He does not feel confused since discharge.  He does not feel depressed nor anxiety.  He sleeps well most of the time.  He has good appetite (he eats during this interview).  He denies SI.  He feels comfortable to stay on the current dose of duloxetine.  ? ? ?Daily routine: takes a walk once a day, watches TV, sit in the porch ?Employment:  Unemployed, on disability since 2003 since open heart surgery. 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 ?Visit Diagnosis:  ?  ICD-10-CM   ?1. MDD (major depressive disorder), recurrent, in partial remission (HCC)  F33.41 DULoxetine (CYMBALTA) 60 MG capsule  ?  ? ? ?Past Psychiatric History: Please see initial evaluation for full details. I have reviewed the history. No updates at this time.  ?  ? ?Past Medical History:  ?Past Medical History:  ?Diagnosis Date  ? Anaphylaxis   ? IGE mediated  ?  Anxiety   ? Arthritis   ? Cancer [facility])   ? Prostate  ? Cardiomyopathy ([place])   ? CHF (congestive heart failure) ([place])   ? COPD (chronic obstructive pulmonary disease) ([place])   ? Coronary atherosclerosis of native coronary artery   ? Multivessel status post CABG 2010  ? Depression   ? Essential hypertension   ? History of CVA (cerebrovascular accident) 01/2012  ? Right posterior frontal cortical and subcortical brain by MRI, no hemorrhage. Carotid Dopplers showed only 1-50% bilateral ICA stenoses. Echocardiogram showed LVEF 50%, no major valvular abnormalities.  ? History of kidney  stones   ? Mixed hyperlipidemia   ? Myocardial infarction [facility]) 2010  ? OSA (obstructive sleep apnea)   ? Pneumonia   ? Prostate cancer ([place]) 12/2020  ? Dr. Tresa Moore at [facility]  ? Stroke [facility])   ? Type 2 diabetes mellitus ([place])   ?  ?Past Surgical History:  ?Procedure Laterality Date  ? CHOLECYSTECTOMY    ? CORONARY ARTERY BYPASS GRAFT  2010  ? LIMA to LAD, SVG to diagonal, SVG to OM1 and OM 2, SVG to RCA  ? DENTAL SURGERY  2003  ? GASTRIC BYPASS  2010  ? HERNIA REPAIR  2011, 2012  ? INCISIONAL HERNIA REPAIR N/A 07/23/2013  ? Procedure: HERNIA REPAIR INCISIONAL WITH MESH;  Surgeon: Jamesetta So, MD;  Location: [facility];  Service: General;  Laterality: N/A;  ? INSERTION OF MESH N/A 07/23/2013  ? Procedure: INSERTION OF MESH;  Surgeon: Jamesetta So, MD;  Location: [facility] OR[facility];  Service: General;  Laterality: N/A;  ? LEFT HEART CATHETERIZATION WITH CORONARY/GRAFT ANGIOGRAM N/A 11/01/2013  ? Procedure: LEFT HEART CATHETERIZATION WITH Beatrix Fetters;  Surgeon: Blane Ohara, MD;  Location: [facility] CATH LAB;  Service: Cardiovascular;  Laterality: N/A;  ? LYMPH NODE DISSECTION Bilateral 04/04/2021  ? Procedure: PELVIC LYMPH NODE DISSECTION;  Surgeon: Alexis Frock, MD;  Location: [facility] ORS;  Service: Urology;  Laterality: Bilateral;  ? ROBOT ASSISTED LAPAROSCOPIC RADICAL PROSTATECTOMY N/A 04/04/2021  ? Procedure: XI ROBOTIC ASSISTED LAPAROSCOPIC RADICAL PROSTATECTOMY WITH INDOCYANINE GREEN DYE;  Surgeon: Alexis Frock, MD;  Location: [facility];  Service: Urology;  Laterality: N/A;  3 HRS  ? [place]  ? right 1st and 2nd toe  ? TONSILECTOMY, ADENOIDECTOMY, BILATERAL MYRINGOTOMY AND TUBES    ? TONSILLECTOMY    ? VENTRAL HERNIA REPAIR N/A 10/28/2012  ? Procedure: LAPAROSCOPIC VENTRAL HERNIA;  Surgeon: Donato Heinz, MD;  Location: [facility];  Service: General;  Laterality: N/A;  ? ? ?Family Psychiatric History: Please see initial evaluation for full details. I have reviewed the history. No updates at this time.  ?   ? ?Family History:  ?Family History  ?Problem Relation Age of Onset  ? Lung cancer Father   ? Heart disease Father   ? Heart disease Mother   ? Congestive Heart Failure Mother   ? Diabetes Mother   ? Hyperlipidemia Mother   ? Alcohol abuse Brother   ? Breast cancer Maternal Aunt   ? Suicidality Cousin   ? ? ?Social History:  ?Social History  ? ?Socioeconomic History  ? Marital status: Legally Separated  ?  Spouse name: Jolayne Haines  ? Number of children: 2  ? Years of education: Not on file  ? Highest education level: 12th grade  ?Occupational History  ? Occupation: disabled  ?Tobacco Use  ? Smoking status: Every Day  ?  Packs/day: 1.00  ?  Years: 30.00  ?  Pack years: 30.00  ?  Types: Cigarettes  ?  Start date: 06/08/1982  ? Smokeless tobacco: Never  ? Tobacco comments:  ?  1 pack per day 03/04/2018  ?Vaping Use  ? Vaping Use: Never used  ?Substance and Sexual Activity  ? Alcohol use: Yes  ?  Comment: occassionally  ? Drug use: No  ? Sexual activity: Yes  ?  Birth control/protection: None  ?Other Topics Concern  ? Not on file  ?Social History Narrative  ? Disabled since age 72 lives with wife and children  ?   ? Patient is right-handed. He is married, lives in a 1 story house, has a ramp to enter. Drinks 64oz of Mtn. Dew a day. Prior to CVA was walking daily. 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  ?   ? ?Social Determinants of Health  ? ?Financial Resource Strain: Not on file  ?Food Insecurity: Not on file  ?Transportation Needs: Not on file  ?Physical Activity: Not on file  ?Stress: Not on file  ?Social Connections: Not on file  ? ? ?Allergies:  ?Allergies  ?Allergen Reactions  ? Ibuprofen Anaphylaxis, Hives and Swelling  ? Iodinated Contrast Media Anaphylaxis, Shortness Of Breath, Swelling, Rash and Itching  ?  Isovue contrast is most acceptable agent based on previous experience and testing with premedications ?Other reaction(s): throat swells up ?Other reaction(s): throat swells up ?Other  reaction(s): Other  ? Nsaids Anaphylaxis, Hives, Swelling and Rash  ?  Can take Aspirin 325 mg or lower ?Other reaction(s): rash ?Other reaction(s): rash  ? Ace Inhibitors   ?  Other reaction(s): Unknown ?Other re

## 2021-11-22 ENCOUNTER — Telehealth (INDEPENDENT_AMBULATORY_CARE_PROVIDER_SITE_OTHER): Payer: Medicare HMO | Admitting: Psychiatry

## 2021-11-22 ENCOUNTER — Encounter: Payer: Self-pay | Admitting: Psychiatry

## 2021-11-22 DIAGNOSIS — J449 Chronic obstructive pulmonary disease, unspecified: Secondary | ICD-10-CM | POA: Diagnosis not present

## 2021-11-22 DIAGNOSIS — F17210 Nicotine dependence, cigarettes, uncomplicated: Secondary | ICD-10-CM | POA: Diagnosis not present

## 2021-11-22 DIAGNOSIS — I10 Essential (primary) hypertension: Secondary | ICD-10-CM | POA: Diagnosis not present

## 2021-11-22 DIAGNOSIS — J9611 Chronic respiratory failure with hypoxia: Secondary | ICD-10-CM | POA: Diagnosis not present

## 2021-11-22 DIAGNOSIS — E440 Moderate protein-calorie malnutrition: Secondary | ICD-10-CM | POA: Diagnosis not present

## 2021-11-22 DIAGNOSIS — R4582 Worries: Secondary | ICD-10-CM | POA: Diagnosis not present

## 2021-11-22 DIAGNOSIS — E8881 Metabolic syndrome: Secondary | ICD-10-CM | POA: Diagnosis not present

## 2021-11-22 DIAGNOSIS — F3341 Major depressive disorder, recurrent, in partial remission: Secondary | ICD-10-CM | POA: Diagnosis not present

## 2021-11-22 DIAGNOSIS — E119 Type 2 diabetes mellitus without complications: Secondary | ICD-10-CM | POA: Diagnosis not present

## 2021-11-22 MED ORDER — DULOXETINE HCL 60 MG PO CPEP: 60.0000 mg | ORAL_CAPSULE | Freq: Every day | ORAL | 0 refills | Status: DC

## 2021-11-22 NOTE — Patient Instructions (Signed)
1. Continue duloxetine 60 mg daily  ?2. Next appointment:  8/10 at 8:40 ?

## 2021-11-23 DIAGNOSIS — Z79899 Other long term (current) drug therapy: Secondary | ICD-10-CM | POA: Diagnosis not present

## 2021-11-23 DIAGNOSIS — Z6823 Body mass index (BMI) 23.0-23.9, adult: Secondary | ICD-10-CM | POA: Diagnosis not present

## 2021-11-23 DIAGNOSIS — M79662 Pain in left lower leg: Secondary | ICD-10-CM | POA: Diagnosis not present

## 2021-11-23 DIAGNOSIS — R030 Elevated blood-pressure reading, without diagnosis of hypertension: Secondary | ICD-10-CM | POA: Diagnosis not present

## 2021-11-23 DIAGNOSIS — M545 Low back pain, unspecified: Secondary | ICD-10-CM | POA: Diagnosis not present

## 2021-11-23 DIAGNOSIS — G894 Chronic pain syndrome: Secondary | ICD-10-CM | POA: Diagnosis not present

## 2021-11-23 DIAGNOSIS — F17210 Nicotine dependence, cigarettes, uncomplicated: Secondary | ICD-10-CM | POA: Diagnosis not present

## 2021-11-23 DIAGNOSIS — E1165 Type 2 diabetes mellitus with hyperglycemia: Secondary | ICD-10-CM | POA: Diagnosis not present

## 2021-11-28 DIAGNOSIS — Z79899 Other long term (current) drug therapy: Secondary | ICD-10-CM | POA: Diagnosis not present

## 2021-12-17 ENCOUNTER — Other Ambulatory Visit: Payer: Self-pay | Admitting: Cardiology

## 2021-12-20 ENCOUNTER — Ambulatory Visit: Payer: Medicare HMO | Admitting: Physical Medicine & Rehabilitation

## 2021-12-24 DIAGNOSIS — Z6824 Body mass index (BMI) 24.0-24.9, adult: Secondary | ICD-10-CM | POA: Diagnosis not present

## 2021-12-24 DIAGNOSIS — M545 Low back pain, unspecified: Secondary | ICD-10-CM | POA: Diagnosis not present

## 2021-12-24 DIAGNOSIS — Z79899 Other long term (current) drug therapy: Secondary | ICD-10-CM | POA: Diagnosis not present

## 2021-12-24 DIAGNOSIS — R030 Elevated blood-pressure reading, without diagnosis of hypertension: Secondary | ICD-10-CM | POA: Diagnosis not present

## 2021-12-24 DIAGNOSIS — M79662 Pain in left lower leg: Secondary | ICD-10-CM | POA: Diagnosis not present

## 2021-12-24 DIAGNOSIS — G894 Chronic pain syndrome: Secondary | ICD-10-CM | POA: Diagnosis not present

## 2021-12-24 DIAGNOSIS — E1165 Type 2 diabetes mellitus with hyperglycemia: Secondary | ICD-10-CM | POA: Diagnosis not present

## 2021-12-24 DIAGNOSIS — F17210 Nicotine dependence, cigarettes, uncomplicated: Secondary | ICD-10-CM | POA: Diagnosis not present

## 2021-12-25 ENCOUNTER — Encounter: Payer: Medicare HMO | Admitting: Physical Medicine & Rehabilitation

## 2021-12-27 ENCOUNTER — Encounter: Payer: Medicare HMO | Attending: Physical Medicine & Rehabilitation | Admitting: Physical Medicine & Rehabilitation

## 2021-12-27 ENCOUNTER — Encounter: Payer: Self-pay | Admitting: Physical Medicine & Rehabilitation

## 2021-12-27 VITALS — BP 107/69 | HR 83 | Ht 71.0 in | Wt 174.0 lb

## 2021-12-27 DIAGNOSIS — M7552 Bursitis of left shoulder: Secondary | ICD-10-CM | POA: Insufficient documentation

## 2021-12-27 DIAGNOSIS — G8929 Other chronic pain: Secondary | ICD-10-CM | POA: Insufficient documentation

## 2021-12-27 DIAGNOSIS — M25561 Pain in right knee: Secondary | ICD-10-CM | POA: Insufficient documentation

## 2021-12-27 NOTE — Progress Notes (Addendum)
Shoulder injectionLeft subacromial  Without  ultrasound guidance  Indication:Left Shoulder pain not relieved by medication management and other conservative care.  Informed consent was obtained after describing risks and benefits of the procedure with the patient, this includes bleeding, bruising, infection and medication side effects. The patient wishes to proceed and has given written consent. Patient was placed in a seated position. TheLeft shoulder was marked and prepped with betadine in the subacromial area. A 25-gauge 1-1/2 inch needle was inserted into the subacromial area. After negative draw back for blood, a solution containing 1 mL of 6 mg per ML betamethasone and 4 mL of 1% lidocaine was injected. A band aid was applied. The patient tolerated the procedure well. Post procedure instructions were given.  Pt c/o RIght knee pain  No recent trauma, no swelling , pain is medial and behaind knee cap  Exam No effusion ,  No erythema, no pain with ROM except with end range knee flexion,  mild tenderness over Quad tendon, no pain over hamstrings or patellar tendon.   No popliteal mass, no valgus or varus deformity  A/P   Right chronic knee pain , intact side , has near 100% WB on RIght sid e, will order xray to check for degenerative changes, may benefit from knee injection

## 2021-12-27 NOTE — Addendum Note (Signed)
Addended by: Charlett Blake on: 12/27/2021 01:38 PM   Modules accepted: Orders, Level of Service

## 2022-01-14 ENCOUNTER — Other Ambulatory Visit: Payer: Self-pay | Admitting: Cardiology

## 2022-01-14 ENCOUNTER — Other Ambulatory Visit: Payer: Self-pay | Admitting: Neurology

## 2022-01-21 DIAGNOSIS — C61 Malignant neoplasm of prostate: Secondary | ICD-10-CM | POA: Diagnosis not present

## 2022-01-22 DIAGNOSIS — R030 Elevated blood-pressure reading, without diagnosis of hypertension: Secondary | ICD-10-CM | POA: Diagnosis not present

## 2022-01-22 DIAGNOSIS — M545 Low back pain, unspecified: Secondary | ICD-10-CM | POA: Diagnosis not present

## 2022-01-22 DIAGNOSIS — Z79899 Other long term (current) drug therapy: Secondary | ICD-10-CM | POA: Diagnosis not present

## 2022-01-22 DIAGNOSIS — E1165 Type 2 diabetes mellitus with hyperglycemia: Secondary | ICD-10-CM | POA: Diagnosis not present

## 2022-01-22 DIAGNOSIS — Z6824 Body mass index (BMI) 24.0-24.9, adult: Secondary | ICD-10-CM | POA: Diagnosis not present

## 2022-01-22 DIAGNOSIS — E559 Vitamin D deficiency, unspecified: Secondary | ICD-10-CM | POA: Diagnosis not present

## 2022-01-22 DIAGNOSIS — M79662 Pain in left lower leg: Secondary | ICD-10-CM | POA: Diagnosis not present

## 2022-01-22 DIAGNOSIS — F17210 Nicotine dependence, cigarettes, uncomplicated: Secondary | ICD-10-CM | POA: Diagnosis not present

## 2022-01-22 DIAGNOSIS — G894 Chronic pain syndrome: Secondary | ICD-10-CM | POA: Diagnosis not present

## 2022-01-28 DIAGNOSIS — C61 Malignant neoplasm of prostate: Secondary | ICD-10-CM | POA: Diagnosis not present

## 2022-01-28 DIAGNOSIS — C775 Secondary and unspecified malignant neoplasm of intrapelvic lymph nodes: Secondary | ICD-10-CM | POA: Diagnosis not present

## 2022-01-28 DIAGNOSIS — N393 Stress incontinence (female) (male): Secondary | ICD-10-CM | POA: Diagnosis not present

## 2022-01-28 DIAGNOSIS — N5231 Erectile dysfunction following radical prostatectomy: Secondary | ICD-10-CM | POA: Diagnosis not present

## 2022-02-06 ENCOUNTER — Ambulatory Visit (HOSPITAL_COMMUNITY)
Admission: RE | Admit: 2022-02-06 | Discharge: 2022-02-06 | Disposition: A | Discharge status: Home / Self Care | Payer: Medicare HMO | Source: Ambulatory Visit | Attending: Physical Medicine & Rehabilitation | Admitting: Physical Medicine & Rehabilitation

## 2022-02-06 DIAGNOSIS — G8929 Other chronic pain: Secondary | ICD-10-CM | POA: Insufficient documentation

## 2022-02-06 DIAGNOSIS — M25561 Pain in right knee: Secondary | ICD-10-CM | POA: Insufficient documentation

## 2022-02-08 ENCOUNTER — Ambulatory Visit: Payer: Medicare HMO | Admitting: *Deleted

## 2022-02-10 ENCOUNTER — Encounter: Payer: Self-pay | Admitting: *Deleted

## 2022-02-10 NOTE — Patient Instructions (Signed)
Visit Information  Thank you for taking time to visit with me today. Please don't hesitate to contact me if I can be of assistance to you.   Please call the care guide team at [phone] if you need to cancel or reschedule your appointment.   If you are experiencing a Mental Health or Behavioral Health Crisis or need someone to talk to, please call the Suicide and Crisis Lifeline: 988 call the [place] National Suicide Prevention Lifeline: 1-800-273-8255 or TTY: 1-800-799-4 TTY (1-800-799-4889) to talk to a trained counselor call 1-800-273-TALK (toll free, 24 hour hotline) go to [facility] [address], [place] ([phone]) call the [place] Crisis Line: 800-939-9988 call 911  Patient verbalizes understanding of instructions and care plan provided today and agrees to view in MyChart. Active MyChart status and patient understanding of how to access instructions and care plan via MyChart confirmed with patient.     No further follow up required.  Malahki Gasaway, BSW, MSW, LCSW  Licensed Clinical Social Worker  [facility] [place] [facility]  Mailing Address-[address], [place], [place] [address] Physical Address-[address], [place], [place] [address] Toll Free Main # [phone] Fax # [phone] Cell # [phone] [email or web address]            

## 2022-02-10 NOTE — Patient Outreach (Signed)
  Care Coordination   Initial Visit Note   02/10/2022 Name: ESGAR BARNICK MRN: [id number] DOB: [birth year]  CADARIUS NEVARES is a 56 y.o. year old male who sees Caryl Bis, MD for primary care. I spoke with  [name] by phone today.  What matters to the patients health and wellness today?  No Interventions Identified.   Goals Addressed   None     SDOH assessments and interventions completed:   Yes SDOH Interventions Today    Flowsheet Row Most Recent Value  SDOH Interventions   Food Insecurity Interventions Intervention Not Indicated  Financial Strain Interventions Intervention Not Indicated  Housing Interventions Intervention Not Indicated  Physical Activity Interventions Patient Refused  Stress Interventions Intervention Not Indicated  Social Connections Interventions Intervention Not Indicated, Patient Refused  Transportation Interventions Intervention Not Indicated       Care Coordination Interventions Activated:  No  Care Coordination Interventions:  No, not indicated.  Follow up plan: No further intervention required.  Encounter Outcome:  Pt. Visit Completed.  Nat Christen, BSW, MSW, LCSW  Licensed Education officer, [facility]  Mailing [place] [address] [place], [place] [address] Physical Address-[address] [place], [place] [address] Toll Free Main # [phone] Fax # [phone] Cell # [phone] [email or web address]

## 2022-02-11 DIAGNOSIS — E1122 Type 2 diabetes mellitus with diabetic chronic kidney disease: Secondary | ICD-10-CM | POA: Diagnosis not present

## 2022-02-11 DIAGNOSIS — N184 Chronic kidney disease, stage 4 (severe): Secondary | ICD-10-CM | POA: Diagnosis not present

## 2022-02-11 DIAGNOSIS — I5023 Acute on chronic systolic (congestive) heart failure: Secondary | ICD-10-CM | POA: Diagnosis not present

## 2022-02-11 DIAGNOSIS — I130 Hypertensive heart and chronic kidney disease with heart failure and stage 1 through stage 4 chronic kidney disease, or unspecified chronic kidney disease: Secondary | ICD-10-CM | POA: Diagnosis not present

## 2022-02-15 ENCOUNTER — Encounter: Payer: 59 | Attending: Physical Medicine & Rehabilitation | Admitting: Physical Medicine & Rehabilitation

## 2022-02-15 ENCOUNTER — Encounter: Payer: Self-pay | Admitting: Physical Medicine & Rehabilitation

## 2022-02-15 DIAGNOSIS — G8110 Spastic hemiplegia affecting unspecified side: Secondary | ICD-10-CM

## 2022-02-15 DIAGNOSIS — G8114 Spastic hemiplegia affecting left nondominant side: Secondary | ICD-10-CM | POA: Diagnosis not present

## 2022-02-15 NOTE — Patient Instructions (Signed)

## 2022-02-15 NOTE — Progress Notes (Signed)
Dysport Injection for spasticity using needle EMG guidance  Dilution: 200 Units/ml Indication: Severe spasticity which interferes with ADL,mobility and/or  hygiene and is unresponsive to medication management and other conservative care Informed consent was obtained after describing risks and benefits of the procedure with the patient. This includes bleeding, bruising, infection, excessive weakness, or medication side effects. A REMS form is on file and signed. Needle:  needle electrode Number of units per muscle LUE- 500U total FDS 100-  FDP 100-   Biceps 200  Left FPL 100   LLE 500U total   Med  gastroc 200U  Tibialis post  100U    Soleus  200U All injections were done after obtaining appropriate EMG activity and after negative drawback for blood. The patient tolerated the procedure well. Post procedure instructions were given. A followup appointment was made in 6 wks to monitor effect
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
## 2022-02-21 ENCOUNTER — Telehealth: Payer: Self-pay | Admitting: Psychiatry

## 2022-02-21 ENCOUNTER — Telehealth: Payer: 59 | Admitting: Psychiatry

## 2022-02-21 NOTE — Telephone Encounter (Signed)
Sent link for video visit through Epic. Patient did not sign in. Called the patient for appointment scheduled today. The patient did not answer the phone. Left voice message to contact the office ([phone]).   ?
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
## 2022-02-25 ENCOUNTER — Encounter: Payer: Self-pay | Admitting: Psychiatry

## 2022-02-25 ENCOUNTER — Telehealth (INDEPENDENT_AMBULATORY_CARE_PROVIDER_SITE_OTHER): Payer: 59 | Admitting: Psychiatry

## 2022-02-25 DIAGNOSIS — F3341 Major depressive disorder, recurrent, in partial remission: Secondary | ICD-10-CM

## 2022-02-25 MED ORDER — DULOXETINE HCL 60 MG PO CPEP: 60.0000 mg | ORAL_CAPSULE | Freq: Every day | ORAL | 0 refills | Status: DC

## 2022-03-12 ENCOUNTER — Other Ambulatory Visit: Payer: Self-pay | Admitting: Cardiology

## 2022-03-15 DIAGNOSIS — E039 Hypothyroidism, unspecified: Secondary | ICD-10-CM | POA: Diagnosis not present

## 2022-03-15 DIAGNOSIS — N184 Chronic kidney disease, stage 4 (severe): Secondary | ICD-10-CM | POA: Diagnosis not present

## 2022-03-15 DIAGNOSIS — E8881 Metabolic syndrome: Secondary | ICD-10-CM | POA: Diagnosis not present

## 2022-03-15 DIAGNOSIS — E7849 Other hyperlipidemia: Secondary | ICD-10-CM | POA: Diagnosis not present

## 2022-03-15 DIAGNOSIS — I10 Essential (primary) hypertension: Secondary | ICD-10-CM | POA: Diagnosis not present

## 2022-03-15 DIAGNOSIS — E782 Mixed hyperlipidemia: Secondary | ICD-10-CM | POA: Diagnosis not present

## 2022-03-15 DIAGNOSIS — E119 Type 2 diabetes mellitus without complications: Secondary | ICD-10-CM | POA: Diagnosis not present

## 2022-03-15 DIAGNOSIS — Z1329 Encounter for screening for other suspected endocrine disorder: Secondary | ICD-10-CM | POA: Diagnosis not present

## 2022-03-20 DIAGNOSIS — E440 Moderate protein-calorie malnutrition: Secondary | ICD-10-CM | POA: Diagnosis not present

## 2022-03-20 DIAGNOSIS — E119 Type 2 diabetes mellitus without complications: Secondary | ICD-10-CM | POA: Diagnosis not present

## 2022-03-20 DIAGNOSIS — G8102 Flaccid hemiplegia affecting left dominant side: Secondary | ICD-10-CM | POA: Diagnosis not present

## 2022-03-20 DIAGNOSIS — I25119 Atherosclerotic heart disease of native coronary artery with unspecified angina pectoris: Secondary | ICD-10-CM | POA: Diagnosis not present

## 2022-03-20 DIAGNOSIS — I10 Essential (primary) hypertension: Secondary | ICD-10-CM | POA: Diagnosis not present

## 2022-03-20 DIAGNOSIS — E7849 Other hyperlipidemia: Secondary | ICD-10-CM | POA: Diagnosis not present

## 2022-03-20 DIAGNOSIS — E8881 Metabolic syndrome: Secondary | ICD-10-CM | POA: Diagnosis not present

## 2022-03-20 DIAGNOSIS — F17210 Nicotine dependence, cigarettes, uncomplicated: Secondary | ICD-10-CM | POA: Diagnosis not present

## 2022-03-20 DIAGNOSIS — G4733 Obstructive sleep apnea (adult) (pediatric): Secondary | ICD-10-CM | POA: Diagnosis not present

## 2022-03-27 DIAGNOSIS — I639 Cerebral infarction, unspecified: Secondary | ICD-10-CM | POA: Diagnosis not present

## 2022-03-27 DIAGNOSIS — R030 Elevated blood-pressure reading, without diagnosis of hypertension: Secondary | ICD-10-CM | POA: Diagnosis not present

## 2022-03-27 DIAGNOSIS — M545 Low back pain, unspecified: Secondary | ICD-10-CM | POA: Diagnosis not present

## 2022-03-27 DIAGNOSIS — E1165 Type 2 diabetes mellitus with hyperglycemia: Secondary | ICD-10-CM | POA: Diagnosis not present

## 2022-03-27 DIAGNOSIS — F39 Unspecified mood [affective] disorder: Secondary | ICD-10-CM | POA: Diagnosis not present

## 2022-03-27 DIAGNOSIS — E663 Overweight: Secondary | ICD-10-CM | POA: Diagnosis not present

## 2022-03-27 DIAGNOSIS — F17210 Nicotine dependence, cigarettes, uncomplicated: Secondary | ICD-10-CM | POA: Diagnosis not present

## 2022-03-27 DIAGNOSIS — M79662 Pain in left lower leg: Secondary | ICD-10-CM | POA: Diagnosis not present

## 2022-03-27 DIAGNOSIS — G894 Chronic pain syndrome: Secondary | ICD-10-CM | POA: Diagnosis not present

## 2022-04-01 ENCOUNTER — Other Ambulatory Visit (HOSPITAL_COMMUNITY): Payer: Self-pay | Admitting: Nurse Practitioner

## 2022-04-01 DIAGNOSIS — F17210 Nicotine dependence, cigarettes, uncomplicated: Secondary | ICD-10-CM

## 2022-04-04 ENCOUNTER — Encounter: Payer: Medicaid Other | Attending: Physical Medicine & Rehabilitation | Admitting: Physical Medicine & Rehabilitation

## 2022-04-04 ENCOUNTER — Encounter: Payer: Self-pay | Admitting: Physical Medicine & Rehabilitation

## 2022-04-04 VITALS — Ht 71.0 in | Wt 181.6 lb

## 2022-04-04 DIAGNOSIS — M24572 Contracture, left ankle: Secondary | ICD-10-CM | POA: Diagnosis not present

## 2022-04-04 DIAGNOSIS — G8110 Spastic hemiplegia affecting unspecified side: Secondary | ICD-10-CM | POA: Insufficient documentation

## 2022-04-04 NOTE — Progress Notes (Signed)
Subjective:    Patient ID: [name], male    DOB: September 01, 1965, 56 y.o.   MRN: [id number] Televisit, video, 10 minutes, patient at home, physician in office.  Limitations of this format explained to patient.  The patient states that he would like to proceed today because his caregiver and ride are ill and he is unable to get to the office. 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  He has been through both inpatient and outpatient rehabilitation.  He has been receiving botulinum toxin injections to the left upper extremity and left lower extremity for severe spasticity.  He has had good relief of spasticity in the left upper extremity and partial relief in the left lower extremity.  He states that the main issue in the lower extremity is that he cannot get his heel on the floor. Pain Inventory Average Pain 6 Pain Right Now 4 My pain is intermittent, sharp, dull, and stabbing  In the last 24 hours, has pain interfered with the following? General activity 0 Relation with others 0 Enjoyment of life 0 What TIME of day is your pain at its worst? evening and night Sleep (in general) Poor  Pain is worse with: walking, bending, sitting, inactivity, standing, and some activites Pain improves with: medication Relief from Meds: 8  Family History  Problem Relation Age of Onset   Lung cancer Father    Heart disease Father    Heart disease Mother    Congestive Heart Failure Mother    Diabetes Mother    Hyperlipidemia Mother    Alcohol abuse Brother    Breast cancer Maternal Aunt    Suicidality Cousin    Social History   Socioeconomic History   Marital status: Legally Separated    Spouse name: Trajon Rosete   Number of children: 2   Years of education: 12   Highest education level: 12th grade  Occupational History   Occupation: disabled  Tobacco Use   Smoking status: Every Day    Packs/day: 1.00    Years: 30.00    Total pack  years: 30.00    Types: Cigarettes    Start date: 06/08/1982    Passive exposure: Current   Smokeless tobacco: Never   Tobacco comments:    1 pack per day 03/04/2018  Vaping Use   Vaping Use: Never used  Substance and Sexual Activity   Alcohol use: Yes    Comment: occassionally   Drug use: No   Sexual activity: Yes    Birth control/protection: None  Other Topics Concern   Not on file  Social History Narrative   Disabled since age 26 lives with wife and children      Patient is right-handed. He is married, lives in a 1 story house, has a ramp to enter. Drinks 64oz of Mtn. Dew a day. Prior to CVA was walking daily. 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      Social Determinants of Health   Financial Resource Strain: Low Risk  (02/10/2022)   Overall Financial Resource Strain (CARDIA)    Difficulty of Paying Living Expenses: Not hard at all  Food Insecurity: No Food Insecurity (02/10/2022)   Hunger Vital Sign    Worried About Running Out of Food in the Last Year: Never true    Ran Out of Food in the Last Year: Never true  Transportation Needs: No Transportation Needs (02/10/2022)   PRAPARE - Transportation  Lack of Transportation (Medical): No    Lack of Transportation (Non-Medical): No  Physical Activity: Inactive (02/10/2022)   Exercise Vital Sign    Days of Exercise per Week: 0 days    Minutes of Exercise per Session: 0 min  Stress: No Stress Concern Present (02/10/2022)   [place]    Feeling of Stress : Only a little  Social Connections: Moderately Isolated (02/10/2022)   Social Connection and Isolation Panel [NHANES]    Frequency of Communication with Friends and Family: More than three times a week    Frequency of Social Gatherings with Friends and Family: More than three times a week    Attends Religious Services: 1 to 4 times per year    Active Member of [employer] or Organizations: No     Attends Archivist Meetings: Never    Marital Status: Separated   Past Surgical History:  Procedure Laterality Date   CHOLECYSTECTOMY     CORONARY ARTERY BYPASS GRAFT  2010   LIMA to LAD, SVG to diagonal, SVG to OM1 and OM 2, SVG to RCA   DENTAL SURGERY  2003   GASTRIC BYPASS  2010   HERNIA REPAIR  2011, 2012   [place] N/A 07/23/2013   Procedure: HERNIA REPAIR INCISIONAL WITH MESH;  Surgeon: Jamesetta So, MD;  Location: [facility];  Service: General;  Laterality: N/A;   INSERTION OF MESH N/A 07/23/2013   Procedure: INSERTION OF MESH;  Surgeon: Jamesetta So, MD;  Location: [facility];  Service: General;  Laterality: N/A;   LEFT HEART CATHETERIZATION WITH CORONARY/GRAFT ANGIOGRAM N/A 11/01/2013   Procedure: LEFT HEART CATHETERIZATION WITH Beatrix Fetters;  Surgeon: Blane Ohara, MD;  Location: [facility] C[facility] LAB;  Service: Cardiovascular;  Laterality: N/A;   LYMPH NODE DISSECTION Bilateral 04/04/2021   Procedure: PELVIC LYMPH NODE DISSECTION;  Surgeon: Alexis Frock, MD;  Location: [facility];  Service: Urology;  Laterality: Bilateral;   ROBOT ASSISTED LAPAROSCOPIC RADICAL PROSTATECTOMY N/A 04/04/2021   Procedure: XI ROBOTIC ASSISTED LAPAROSCOPIC RADICAL PROSTATECTOMY WITH INDOCYANINE GREEN DYE;  Surgeon: Alexis Frock, MD;  Location: [facility];  Service: Urology;  Laterality: N/A;  3 HRS   TOE AMPUTATION  1998   right 1st and 2nd toe   TONSILECTOMY, ADENOIDECTOMY, BILATERAL MYRINGOTOMY AND TUBES     TONSILLECTOMY     VENTRAL HERNIA REPAIR N/A 10/28/2012   Procedure: LAPAROSCOPIC VENTRAL HERNIA;  Surgeon: Donato Heinz, MD;  Location: [facility];  Service: General;  Laterality: N/A;   Past Surgical History:  Procedure Laterality Date   CHOLECYSTECTOMY     CORONARY ARTERY BYPASS GRAFT  2010   LIMA to LAD, SVG to diagonal, SVG to OM1 and OM 2, SVG to RCA   DENTAL SURGERY  2003   GASTRIC BYPASS  2010   HERNIA REPAIR  2011, 2012   [place] N/A 07/23/2013    Procedure: HERNIA REPAIR INCISIONAL WITH MESH;  Surgeon: Jamesetta So, MD;  Location: [facility];  Service: General;  Laterality: N/A;   INSERTION OF MESH N/A 07/23/2013   Procedure: INSERTION OF MESH;  Surgeon: Jamesetta So, MD;  Location: [facility];  Service: General;  Laterality: N/A;   LEFT HEART CATHETERIZATION WITH CORONARY/GRAFT ANGIOGRAM N/A 11/01/2013   Procedure: LEFT HEART CATHETERIZATION WITH Beatrix Fetters;  Surgeon: Blane Ohara, MD;  Location: [facility] CATH LAB;  Service: Cardiovascular;  Laterality: N/A;   LYMPH NODE DISSECTION Bilateral 04/04/2021  Procedure: PELVIC LYMPH NODE DISSECTION;  Surgeon: Alexis Frock, MD;  [facility];  Service: Urology;  Laterality: Bilateral;   ROBOT ASSISTED LAPAROSCOPIC RADICAL PROSTATECTOMY N/A 04/04/2021   Procedure: XI ROBOTIC ASSISTED LAPAROSCOPIC RADICAL PROSTATECTOMY WITH INDOCYANINE GREEN DYE;  Surgeon: Alexis Frock, MD;  [facility];  Service: Urology;  Laterality: N/A;  3 HRS   TOE AMPUTATION  1998   right 1st and 2nd toe   TONSILECTOMY, ADENOIDECTOMY, BILATERAL MYRINGOTOMY AND TUBES     TONSILLECTOMY     VENTRAL HERNIA REPAIR N/A 10/28/2012   Procedure: LAPAROSCOPIC VENTRAL HERNIA;  Surgeon: Donato Heinz, MD;  Location: [facility];  Service: General;  Laterality: N/A;   Past Medical History:  Diagnosis Date   Anaphylaxis    IGE mediated   Anxiety    Arthritis    Cancer ([place])    Prostate   Cardiomyopathy ([place])    CHF (congestive heart failure) ([place])    COPD (chronic obstructive pulmonary disease) ([place])    Coronary atherosclerosis of native coronary artery    Multivessel status post CABG 2010   Depression    Essential hypertension    History of CVA (cerebrovascular accident) 01/2012   Right posterior frontal cortical and subcortical brain by MRI, no hemorrhage. Carotid Dopplers showed only 1-50% bilateral ICA stenoses. Echocardiogram showed LVEF 50%, no major valvular abnormalities.   History of kidney stones     Mixed hyperlipidemia    Myocardial infarction ([place]) 2010   OSA (obstructive sleep apnea)    Pneumonia    Prostate cancer ([place]) 12/2020   Dr. Tresa Moore at [facility]   Stroke [facility])    Type 2 diabetes mellitus ([place])    Ht '5\' 11"'$  (1.803 m)   Wt 181 lb 9.6 oz (82.4 kg)   BMI 25.33 kg/m   Opioid Risk Score:   Fall Risk Score:  `1  Depression screen PHQ 2/9     04/04/2022   10:50 AM 02/15/2022    1:20 PM 02/10/2022    6:26 PM 12/27/2021    1:15 PM 11/09/2021    9:47 AM 09/27/2021   10:21 AM 08/31/2021    9:55 AM  Depression screen PHQ 2/9  Decreased Interest 0 2 0 1 0 1   Down, Depressed, Hopeless 0 '2 1 1 '$ 0 1   PHQ - 2 Score 0 '4 1 2 '$ 0 2      Information is confidential and restricted. Go to Review Flowsheets to unlock data.     Review of Systems  Musculoskeletal:  Positive for gait problem.       Left arm pain and left leg pain   All other systems reviewed and are negative.     Objective:   Physical Exam   Limited by telehealth visit. Speech without significant dysarthria Orientation x3 Mood and affect are appropriate     Assessment & Plan:   1.  Chronic left spastic hemiplegia good relief of spasms using botulinum toxin left upper extremity peripherally left lower extremity.  He likely has underlying contracture of the left Achilles would like him evaluated by orthopedics for possible contracture release. I will see him back in 6 weeks to repeat the botulinum toxin injections same muscle groups same doses.

## 2022-04-09 ENCOUNTER — Other Ambulatory Visit: Payer: Self-pay | Admitting: Cardiology

## 2022-04-15 NOTE — Progress Notes (Unsigned)
NEUROLOGY FOLLOW UP OFFICE NOTE  [name] [id number]  Assessment/Plan:   Right sided occipital neuralgia/cervicogenic headache Spastic hemiplegia as late effect of stroke Vascular mild neurocognitive disorder Hypertension Hyperlipidemia Type 2 diabetes mellitus Ischemic cardiomyopathy Generalized anxiety disorder with depression   1.To address neuralgia/headache, Lyrica '75mg'$  twice daily 2.  Secondary stroke prevention as managed by PCP:  - ASA and Plavix   - Statin.  LDL goal less than 70  - Hgb A1c goal less than 7  - Normotensive blood pressure 3.  Follow up ***   Subjective:  [name] is a 56 year old right-handed man with hypertension, type 2 diabetes mellitus, CAD, ischemic cardiomyopathy, COPD, tobacco use disorder, depression, prostate cancer and history of prior strokes who follows up for migraines.   UPDATE: Current medications:  Plavix '75mg'$ , atorvastatin '80mg'$ , Coreg, Imdur, Lasix, alprazolam '1mg'$  PRN, baclofen '10mg'$  BID PRN, Cymbalta '90mg'$  daily, Lyrica '75mg'$  BID   ***  HISTORY: He was admitted to [facility] on 06/14/17 with sudden onset left sided weakness and numbness and difficulty speaking.  He initially presented to [facility] where he received tPA with NIHSS of 15 and was then transferred to [facility].    CT of head from [facility] reportedly showed "acute infarction involving portions of the posterior right basal ganglia, right corona radiata and body of the right caudate nucleus".  MRI of brain at [facility] confirmed acute right basal ganglia infarct as well as remote right frontal infarct.  MRA of head and carotid doppler revealed no significant intracranial or extracranial arterial stenosis or occlusion.  He was unable to have CTA due to allergy to iodinated contrast.    Echocardiogram with bubble study showed EF of 25-30% with no evidence of PFO or ASD.  He was evaluated by cardiology for ischemic cardiomyopathy.  Lexiscan  nuclear perfusion study revealed EF 18% with large infarct involving the apex and periapical anterior wall with no evidence of ischemia.  He was advised to follow up with outpatient cardiology.  LDL was 123 and triglycerides were 237.  Hgb A1c was 5.7.  He was already taking ASA '325mg'$  but not daily, as well as Plavix.  He is continued on ASA and Plavix.  06/30/2019 Echocardiogram:  LV EF 40-50%   He was seen in the ED at [facility] on 09/22/17 for headache.  CT of head showed no acute findings but was read as demonstrating a chronic appearing infarct in the right posterior limb of internal capsule that was not present on prior CT from 06/14/17.  He also complained of left leg pain.  He reportedly had an elevated d dimer.  CT chest and venous doppler of lower extremity were negative for DVT.  However, the started him on Xarelto.  He was told to stop Plavix by his PCP.  I contacted his PCP, Dr. Quillian Quince, who reviewed the ED notes and is also unsure why they started Xarelto when imaging was negative for DVT.  He was advised to discontinue Xarelto and restart Plavix with aspirin.   Headaches returned in December 2020.  Right sided 10/10 throbbing/pressure pain in the temple radiating to back of head.  Associated nausea, blurred vision, photophobia.  They last 2 hours and occur 2 to 3 days a week.  He reports right occipital/suboccipital numbness and tingling off and on.  He also notes that his right hand twitches about every other day, lasting 20-30 minutes.  It may occur while holding a cup but also at  rest.  Due to worsening headache, MRI of brain without contrast was performed on 09/13/2019, which was personally reviewed, and demonstrated stable advanced chronic small vessel ischemic changes but no acute intracranial abnormality. Due to right hand myoclonus, EEG was performed on 08/17/2019, which was normal.  MRI of cervical spine on 08/08/2020 showed mild cervical spondylosis with mild spinal  stenosis and mild to moderate neural foraminal stenosis at C3-4 and C4-5.   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  He did go to PT and speech therapy.     He sees Dr. Letta Pate for Dysport injection to treat spasticity.  He reports panic attacks.  He sees Dr. Toy Care for depression and anxiety.   Past medications:  gabapentin '600mg'$  BID  PAST MEDICAL HISTORY: Past Medical History:  Diagnosis Date   Anaphylaxis    IGE mediated   Anxiety    Arthritis    Cancer ([place])    Prostate   Cardiomyopathy ([place])    CHF (congestive heart failure) ([facility])    COPD (chronic obstructive pulmonary disease) ([place])    Coronary atherosclerosis of native coronary artery    Multivessel status post CABG 2010   Depression    Essential hypertension    History of CVA (cerebrovascular accident) 01/2012   Right posterior frontal cortical and subcortical brain by MRI, no hemorrhage. Carotid Dopplers showed only 1-50% bilateral ICA stenoses. Echocardiogram showed LVEF 50%, no major valvular abnormalities.   History of kidney stones    Mixed hyperlipidemia    Myocardial infarction ([place]) 2010   OSA (obstructive sleep apnea)    Pneumonia    Prostate cancer ([place]) 12/2020   Dr. Tresa Moore at [facility]   Stroke [facility])    Type 2 diabetes mellitus [facility])     MEDICATIONS: Current Outpatient Medications on File Prior to Visit  Medication Sig Dispense Refill   acetaminophen (TYLENOL) 500 MG tablet Take 1,000 mg by mouth every 6 (six) hours as needed for moderate pain or headache.     albuterol (PROVENTIL HFA;VENTOLIN HFA) 108 (90 BASE) MCG/ACT inhaler Inhale 2 puffs into the lungs every 6 (six) hours as needed for wheezing or shortness of breath.     ALPRAZolam (XANAX) 1 MG tablet Take 1 mg by mouth 2 (two) times daily as needed.     aspirin EC 81 MG tablet Take 1  tablet (81 mg total) by mouth daily. 90 tablet 3   atorvastatin (LIPITOR) 40 MG tablet TAKE 1 TABLET ONCE DAILY. 14 tablet 0   baclofen (LIORESAL) 20 MG tablet Take by mouth.     bisacodyl (DULCOLAX) 5 MG EC tablet Take by mouth.     carvedilol (COREG) 3.125 MG tablet Take 3.125 mg by mouth 2 (two) times daily.     Cholecalciferol (VITAMIN D) 50 MCG (2000 UT) CAPS 1 capsule     clopidogrel (PLAVIX) 75 MG tablet [id number]  Plavix 75 mg tablet 75 milligrams PO once a day for ? CVA; 1 tab po daily     dapsone 25 MG tablet Take by mouth.     dibucaine (NUPERCAINAL) 1 % OINT 1 application as needed     diltiazem (CARDIZEM CD) 120 MG 24 hr capsule Take by mouth.     diltiazem (CARDIZEM CD) 240 MG 24 hr capsule Take 240 mg by mouth daily.      docusate sodium (COLACE) 100 MG capsule Take 1 capsule (100 mg total) by  mouth 2 (two) times daily. (Patient not taking: Reported on 02/15/2022)     DULoxetine (CYMBALTA) 60 MG capsule Take 1 capsule (60 mg total) by mouth daily. 90 capsule 0   ENTRESTO 49-51 MG TAKE (1) TABLET TWICE DAILY. 30 tablet 0   fenofibrate micronized (LOFIBRA) 134 MG capsule Take 134 mg by mouth daily before breakfast.     fluconazole (DIFLUCAN) 200 MG tablet Take by mouth.     fluticasone (CUTIVATE) 0.05 % cream Apply 1 application topically 2 (two) times daily.     fluticasone (FLONASE) 50 MCG/ACT nasal spray [id number]  Flonase Allergy Relief 50 mcg/actuation spray 1 spray(s) INH as directed for Allergy; 1 spray each nostril daily     furosemide (LASIX) 20 MG tablet Take 20 mg by mouth daily.      hydrocortisone (ANUSOL-HC) 25 MG suppository 1 suppository     isosorbide mononitrate (IMDUR) 30 MG 24 hr tablet 1 tablet in the morning     isosorbide mononitrate (IMDUR) 60 MG 24 hr tablet Take 60 mg by mouth 2 (two) times daily.      meclizine (ANTIVERT) 25 MG tablet Take by mouth.     midodrine (PROAMATINE) 10 MG tablet [id number]  midodrine 10 mg tablet 10 milligrams PO three times a day  for Hypotension; 1 tab po TID     naloxone (NARCAN) nasal spray 4 mg/0.1 mL Place 1 spray into the nose as needed (opioid overdose).     nicotine (NICODERM CQ - DOSED IN MG/24 HOURS) 21 mg/24hr patch 21 mg daily.     nicotine polacrilex (NICORETTE) 4 MG gum Place inside cheek.     nitroGLYCERIN (NITROSTAT) 0.4 MG SL tablet 1 tablet as directed     nystatin (MYCOSTATIN) 100000 UNIT/ML suspension Take by mouth.     ondansetron (ZOFRAN) 8 MG tablet Take 8 mg by mouth 3 (three) times daily.     ondansetron (ZOFRAN-ODT) 8 MG disintegrating tablet Take 8 mg by mouth every 6 (six) hours as needed.     oxyCODONE-acetaminophen (PERCOCET) 7.5-325 MG tablet Take 1 tablet by mouth every 6 (six) hours as needed for moderate pain. 10 tablet 0   pantoprazole (PROTONIX) 40 MG tablet Take by mouth.     polyethylene glycol powder (GLYCOLAX/MIRALAX) 17 GM/SCOOP powder Take 17 g by mouth daily.     pregabalin (LYRICA) 75 MG capsule TAKE 1 CAPSULE BY MOUTH TWICE DAILY. 60 capsule 3   Sennosides-Docusate Sodium (SENOKOT S PO) [id number]  Senokot-S 8.6-50 mg tablet 4 tab(s) PO three times a day as needed for Constipation; 4 tabs po TID PRN     silver sulfADIAZINE (SILVADENE) 1 % cream Apply 1 application topically 3 (three) times daily.     tamsulosin (FLOMAX) 0.4 MG CAPS capsule Take 0.4 mg by mouth.     tiZANidine (ZANAFLEX) 2 MG tablet Take by mouth every 6 (six) hours as needed for muscle spasms.     traZODone (DESYREL) 50 MG tablet Take 50 mg by mouth at bedtime.     triamcinolone cream (KENALOG) 0.1 % Apply 1 application topically 2 (two) times daily.     varenicline (CHANTIX) 1 MG tablet Take by mouth.     No current facility-administered medications on file prior to visit.    ALLERGIES: Allergies  Allergen Reactions   Ibuprofen Anaphylaxis, Hives and Swelling   Iodinated Contrast Media Anaphylaxis, Shortness Of Breath, Swelling, Rash and Itching    Isovue contrast is most acceptable agent based on  previous experience and  testing with premedications Other reaction(s): throat swells up Other reaction(s): throat swells up Other reaction(s): Other   Nsaids Anaphylaxis, Hives, Swelling and Rash    Can take Aspirin 325 mg or lower Other reaction(s): rash Other reaction(s): rash   Ace Inhibitors     Other reaction(s): Unknown Other reaction(s): Unknown   Chantix [Varenicline]     Nightmares    Other Other (See Comments)    Anti-physcotics Personality change   Pollen Extract     Other reaction(s): Unknown Other reaction(s): Unknown    FAMILY HISTORY: Family History  Problem Relation Age of Onset   Lung cancer Father    Heart disease Father    Heart disease Mother    Congestive Heart Failure Mother    Diabetes Mother    Hyperlipidemia Mother    Alcohol abuse Brother    Breast cancer Maternal Aunt    Suicidality Cousin       Objective:  *** General: No acute distress.  Patient appears well-groomed.   Head:  Normocephalic/atraumatic Eyes:  Fundi examined but not visualized Neck: supple, no paraspinal tenderness, full range of motion Heart:  Regular rate and rhythm Lungs:  Clear to auscultation bilaterally Back: No paraspinal tenderness Neurological Exam: alert and oriented to person, place, and time. Speech fluent and not dysarthric, language intact.  Decreased left V2.  Otherwise, CN II-XII intact. Bulk and tone flaccid in left upper extremity.  Muscle strength plegic left upper extremity except 2+/5 hand grip, 2+/5 left hip flexion, plegic left knee flexion, left foot drop, left knee extension; 5/5 right upper and lower extremities.  Sensation to light touch decreased in left upper and lower extremities, Deep tendon reflexes 3+ left upper and lower extremities, 2+ on right.  Finger to nose testing intact on right, unable to assess left.  Gait:  In wheelchair.   Metta Clines, DO  CC: Gar Ponto, MD

## 2022-04-17 ENCOUNTER — Encounter: Payer: Self-pay | Admitting: Neurology

## 2022-04-17 ENCOUNTER — Ambulatory Visit (INDEPENDENT_AMBULATORY_CARE_PROVIDER_SITE_OTHER): Payer: Medicare HMO | Admitting: Neurology

## 2022-04-17 VITALS — BP 77/51 | HR 85 | Ht 71.0 in | Wt 181.6 lb

## 2022-04-17 DIAGNOSIS — Z794 Long term (current) use of insulin: Secondary | ICD-10-CM | POA: Diagnosis not present

## 2022-04-17 DIAGNOSIS — I69354 Hemiplegia and hemiparesis following cerebral infarction affecting left non-dominant side: Secondary | ICD-10-CM | POA: Diagnosis not present

## 2022-04-17 DIAGNOSIS — J9600 Acute respiratory failure, unspecified whether with hypoxia or hypercapnia: Secondary | ICD-10-CM | POA: Diagnosis not present

## 2022-04-17 DIAGNOSIS — I10 Essential (primary) hypertension: Secondary | ICD-10-CM | POA: Diagnosis not present

## 2022-04-17 DIAGNOSIS — E118 Type 2 diabetes mellitus with unspecified complications: Secondary | ICD-10-CM | POA: Diagnosis not present

## 2022-04-17 DIAGNOSIS — M5481 Occipital neuralgia: Secondary | ICD-10-CM | POA: Diagnosis not present

## 2022-04-17 DIAGNOSIS — I959 Hypotension, unspecified: Secondary | ICD-10-CM

## 2022-04-17 NOTE — Patient Instructions (Signed)
Continue pregablin '75mg'$  twice daily

## 2022-04-22 ENCOUNTER — Ambulatory Visit (INDEPENDENT_AMBULATORY_CARE_PROVIDER_SITE_OTHER): Payer: Medicare HMO | Admitting: Orthopedic Surgery

## 2022-04-22 ENCOUNTER — Encounter: Payer: Self-pay | Admitting: Orthopedic Surgery

## 2022-04-22 DIAGNOSIS — I679 Cerebrovascular disease, unspecified: Secondary | ICD-10-CM | POA: Diagnosis not present

## 2022-04-22 DIAGNOSIS — G8110 Spastic hemiplegia affecting unspecified side: Secondary | ICD-10-CM
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
## 2022-04-25 ENCOUNTER — Other Ambulatory Visit: Payer: Self-pay | Admitting: Cardiology

## 2022-04-25 NOTE — Telephone Encounter (Signed)
Needs appointment for further refills

## 2022-04-26 DIAGNOSIS — F17210 Nicotine dependence, cigarettes, uncomplicated: Secondary | ICD-10-CM | POA: Diagnosis not present

## 2022-04-26 DIAGNOSIS — I639 Cerebral infarction, unspecified: Secondary | ICD-10-CM | POA: Diagnosis not present

## 2022-04-26 DIAGNOSIS — E1165 Type 2 diabetes mellitus with hyperglycemia: Secondary | ICD-10-CM | POA: Diagnosis not present

## 2022-04-26 DIAGNOSIS — M545 Low back pain, unspecified: Secondary | ICD-10-CM | POA: Diagnosis not present

## 2022-04-26 DIAGNOSIS — E663 Overweight: Secondary | ICD-10-CM | POA: Diagnosis not present

## 2022-04-26 DIAGNOSIS — F39 Unspecified mood [affective] disorder: Secondary | ICD-10-CM | POA: Diagnosis not present

## 2022-04-26 DIAGNOSIS — M79662 Pain in left lower leg: Secondary | ICD-10-CM | POA: Diagnosis not present

## 2022-04-26 DIAGNOSIS — G894 Chronic pain syndrome: Secondary | ICD-10-CM | POA: Diagnosis not present

## 2022-04-26 DIAGNOSIS — R030 Elevated blood-pressure reading, without diagnosis of hypertension: Secondary | ICD-10-CM | POA: Diagnosis not present

## 2022-04-29 ENCOUNTER — Ambulatory Visit (HOSPITAL_COMMUNITY)
Admission: RE | Admit: 2022-04-29 | Discharge: 2022-04-29 | Disposition: A | Discharge status: Home / Self Care | Payer: Medicare HMO | Source: Ambulatory Visit | Attending: Nurse Practitioner | Admitting: Nurse Practitioner

## 2022-04-29 DIAGNOSIS — F17210 Nicotine dependence, cigarettes, uncomplicated: Secondary | ICD-10-CM | POA: Diagnosis not present

## 2022-05-09 ENCOUNTER — Telehealth: Payer: Self-pay | Admitting: Cardiology

## 2022-05-09 ENCOUNTER — Other Ambulatory Visit: Payer: Self-pay | Admitting: Cardiology

## 2022-05-09 NOTE — Telephone Encounter (Signed)
*  STAT* If patient is at the pharmacy, call can be transferred to refill team.   1. Which medications need to be refilled? (please list name of each medication and dose if known) atorvastatin (LIPITOR) 40 MG tablet ENTRESTO 49-51 MG  2. Which pharmacy/location (including street and city if local pharmacy) is medication to be sent to? [place], [place]  3. Do they need a 30 day or 90 day supply? 90   Pt made an appt for 07/31/21

## 2022-05-09 NOTE — Telephone Encounter (Signed)
Done

## 2022-05-15 ENCOUNTER — Telehealth: Payer: Self-pay | Admitting: Cardiology

## 2022-05-15 MED ORDER — CARVEDILOL 3.125 MG PO TABS: 3.1250 mg | ORAL_TABLET | Freq: Two times a day (BID) | ORAL | 2 refills | Status: DC

## 2022-05-15 NOTE — Telephone Encounter (Signed)
*  STAT* If patient is at the pharmacy, call can be transferred to refill team.   1. Which medications need to be refilled? (please list name of each medication and dose if known)  carvedilol (COREG) 3.125 MG tablet  2. Which pharmacy/location (including street and city if local pharmacy) is medication to be sent to? [place], [place]  3. Do they need a 30 day or 90 day supply? 30 day  Patient is completely out of medication

## 2022-05-15 NOTE — Telephone Encounter (Signed)
Done

## 2022-05-21 ENCOUNTER — Encounter: Payer: Medicare HMO | Admitting: Physical Medicine & Rehabilitation

## 2022-05-21 ENCOUNTER — Encounter: Payer: Self-pay | Admitting: Physical Medicine & Rehabilitation

## 2022-05-21 VITALS — BP 111/64 | HR 80 | Ht 71.0 in | Wt 182.4 lb

## 2022-05-21 DIAGNOSIS — G8110 Spastic hemiplegia affecting unspecified side: Secondary | ICD-10-CM
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
## 2022-05-27 DIAGNOSIS — Z79899 Other long term (current) drug therapy: Secondary | ICD-10-CM | POA: Diagnosis not present

## 2022-05-27 DIAGNOSIS — G894 Chronic pain syndrome: Secondary | ICD-10-CM | POA: Diagnosis not present

## 2022-05-27 DIAGNOSIS — M79662 Pain in left lower leg: Secondary | ICD-10-CM | POA: Diagnosis not present

## 2022-05-27 DIAGNOSIS — F39 Unspecified mood [affective] disorder: Secondary | ICD-10-CM | POA: Diagnosis not present

## 2022-05-27 DIAGNOSIS — R030 Elevated blood-pressure reading, without diagnosis of hypertension: Secondary | ICD-10-CM | POA: Diagnosis not present

## 2022-05-27 DIAGNOSIS — F17210 Nicotine dependence, cigarettes, uncomplicated: Secondary | ICD-10-CM | POA: Diagnosis not present

## 2022-05-27 DIAGNOSIS — M545 Low back pain, unspecified: Secondary | ICD-10-CM | POA: Diagnosis not present

## 2022-05-27 DIAGNOSIS — I639 Cerebral infarction, unspecified: Secondary | ICD-10-CM | POA: Diagnosis not present

## 2022-05-27 DIAGNOSIS — E663 Overweight: Secondary | ICD-10-CM | POA: Diagnosis not present

## 2022-05-27 DIAGNOSIS — E1165 Type 2 diabetes mellitus with hyperglycemia: Secondary | ICD-10-CM | POA: Diagnosis not present

## 2022-05-28 ENCOUNTER — Encounter: Payer: Medicare HMO | Attending: Physical Medicine & Rehabilitation | Admitting: Physical Medicine & Rehabilitation

## 2022-05-28 ENCOUNTER — Encounter: Payer: Self-pay | Admitting: Physical Medicine & Rehabilitation

## 2022-05-28 VITALS — BP 95/56 | HR 69 | Ht 71.0 in | Wt 182.4 lb

## 2022-05-28 DIAGNOSIS — G8110 Spastic hemiplegia affecting unspecified side: Secondary | ICD-10-CM | POA: Insufficient documentation

## 2022-05-28 MED ORDER — ABOBOTULINUMTOXINA 500 UNITS IM SOLR
1000.0000 [IU] | Freq: Once | INTRAMUSCULAR | Status: AC
  Administered 2022-05-28: 1000 [IU] via INTRAMUSCULAR

## 2022-05-28 NOTE — Patient Instructions (Signed)

## 2022-05-28 NOTE — Progress Notes (Signed)
Dysport Injection for spasticity using needle EMG guidance  Dilution: 200 Units/ml Indication: Severe spasticity which interferes with ADL,mobility and/or  hygiene and is unresponsive to medication management and other conservative care Informed consent was obtained after describing risks and benefits of the procedure with the patient. This includes bleeding, bruising, infection, excessive weakness, or medication side effects. A REMS form is on file and signed. Needle:  needle electrode Number of units per muscle LUE- 500U total FDS 100- 1+ FDP 100- 1+  FCR 100 1+ Biceps 100 0  Left opponens pollicis 629 2+   LLE 528U total   Med  gastroc 200U 0  Tibialis post  100U 1-2+   Soleus  200U 1+ All injections were done after obtaining appropriate EMG activity and after negative drawback for blood. The patient tolerated the procedure well. Post procedure instructions were given. A followup appointment was made in 6 wks to monitor effect  Based on EMG , may be able to reduce dose for next injection to 500U Consider L OP 100 L FCR 100 L FDP 100  L Post Tib 100 L soleus 100  Plan Left shoulder injection for chronic adhesive capsulitis

## 2022-05-29 ENCOUNTER — Telehealth (INDEPENDENT_AMBULATORY_CARE_PROVIDER_SITE_OTHER): Payer: Medicare HMO | Admitting: Psychiatry

## 2022-05-29 ENCOUNTER — Encounter: Payer: Self-pay | Admitting: Psychiatry

## 2022-05-29 DIAGNOSIS — F17210 Nicotine dependence, cigarettes, uncomplicated: Secondary | ICD-10-CM

## 2022-05-29 DIAGNOSIS — F3341 Major depressive disorder, recurrent, in partial remission: Secondary | ICD-10-CM

## 2022-05-29 MED ORDER — DULOXETINE HCL 60 MG PO CPEP: 60.0000 mg | ORAL_CAPSULE | Freq: Every day | ORAL | 0 refills | Status: AC

## 2022-05-29 MED ORDER — BUPROPION HCL ER (SMOKING DET) 150 MG PO TB12: 150.0000 mg | ORAL_TABLET | Freq: Every day | ORAL | 1 refills | Status: AC

## 2022-05-29 NOTE — Patient Instructions (Signed)
Continue duloxetine 60 mg daily  Start bupropion 150 mg daily Next appointment:  12/20 at 3:30

## 2022-06-12 DIAGNOSIS — M21372 Foot drop, left foot: Secondary | ICD-10-CM | POA: Diagnosis not present

## 2022-06-20 ENCOUNTER — Other Ambulatory Visit: Payer: Self-pay | Admitting: Neurology

## 2022-06-25 DIAGNOSIS — M545 Low back pain, unspecified: Secondary | ICD-10-CM | POA: Diagnosis not present

## 2022-06-25 DIAGNOSIS — F17210 Nicotine dependence, cigarettes, uncomplicated: Secondary | ICD-10-CM | POA: Diagnosis not present

## 2022-06-25 DIAGNOSIS — Z79899 Other long term (current) drug therapy: Secondary | ICD-10-CM | POA: Diagnosis not present

## 2022-06-25 DIAGNOSIS — M79662 Pain in left lower leg: Secondary | ICD-10-CM | POA: Diagnosis not present

## 2022-06-25 DIAGNOSIS — E663 Overweight: Secondary | ICD-10-CM | POA: Diagnosis not present

## 2022-06-25 DIAGNOSIS — G894 Chronic pain syndrome: Secondary | ICD-10-CM | POA: Diagnosis not present

## 2022-06-25 DIAGNOSIS — I639 Cerebral infarction, unspecified: Secondary | ICD-10-CM | POA: Diagnosis not present

## 2022-06-25 DIAGNOSIS — E1165 Type 2 diabetes mellitus with hyperglycemia: Secondary | ICD-10-CM | POA: Diagnosis not present

## 2022-06-25 DIAGNOSIS — F39 Unspecified mood [affective] disorder: Secondary | ICD-10-CM | POA: Diagnosis not present

## 2022-06-25 DIAGNOSIS — R030 Elevated blood-pressure reading, without diagnosis of hypertension: Secondary | ICD-10-CM | POA: Diagnosis not present

## 2022-06-27 DIAGNOSIS — R972 Elevated prostate specific antigen [PSA]: Secondary | ICD-10-CM | POA: Diagnosis not present

## 2022-06-27 DIAGNOSIS — E7849 Other hyperlipidemia: Secondary | ICD-10-CM | POA: Diagnosis not present

## 2022-06-27 DIAGNOSIS — E1165 Type 2 diabetes mellitus with hyperglycemia: Secondary | ICD-10-CM | POA: Diagnosis not present

## 2022-06-27 DIAGNOSIS — N1831 Chronic kidney disease, stage 3a: Secondary | ICD-10-CM | POA: Diagnosis not present

## 2022-06-27 DIAGNOSIS — K2100 Gastro-esophageal reflux disease with esophagitis, without bleeding: Secondary | ICD-10-CM | POA: Diagnosis not present

## 2022-06-27 DIAGNOSIS — I10 Essential (primary) hypertension: Secondary | ICD-10-CM | POA: Diagnosis not present

## 2022-06-27 DIAGNOSIS — G4733 Obstructive sleep apnea (adult) (pediatric): Secondary | ICD-10-CM | POA: Diagnosis not present

## 2022-06-27 DIAGNOSIS — Z0001 Encounter for general adult medical examination with abnormal findings: Secondary | ICD-10-CM | POA: Diagnosis not present

## 2022-06-27 DIAGNOSIS — F17210 Nicotine dependence, cigarettes, uncomplicated: Secondary | ICD-10-CM | POA: Diagnosis not present

## 2022-07-02 DIAGNOSIS — M25572 Pain in left ankle and joints of left foot: Secondary | ICD-10-CM | POA: Diagnosis not present

## 2022-07-02 DIAGNOSIS — M25512 Pain in left shoulder: Secondary | ICD-10-CM | POA: Diagnosis not present

## 2022-07-02 DIAGNOSIS — I25119 Atherosclerotic heart disease of native coronary artery with unspecified angina pectoris: Secondary | ICD-10-CM | POA: Diagnosis not present

## 2022-07-02 DIAGNOSIS — I10 Essential (primary) hypertension: Secondary | ICD-10-CM | POA: Diagnosis not present

## 2022-07-02 DIAGNOSIS — Z0001 Encounter for general adult medical examination with abnormal findings: Secondary | ICD-10-CM | POA: Diagnosis not present

## 2022-07-02 DIAGNOSIS — J449 Chronic obstructive pulmonary disease, unspecified: Secondary | ICD-10-CM | POA: Diagnosis not present

## 2022-07-02 DIAGNOSIS — G8102 Flaccid hemiplegia affecting left dominant side: Secondary | ICD-10-CM | POA: Diagnosis not present

## 2022-07-02 DIAGNOSIS — E7849 Other hyperlipidemia: Secondary | ICD-10-CM | POA: Diagnosis not present

## 2022-07-02 DIAGNOSIS — J9611 Chronic respiratory failure with hypoxia: Secondary | ICD-10-CM | POA: Diagnosis not present
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
## 2022-07-03 ENCOUNTER — Encounter: Payer: Self-pay | Admitting: Psychiatry

## 2022-07-03 ENCOUNTER — Telehealth (INDEPENDENT_AMBULATORY_CARE_PROVIDER_SITE_OTHER): Payer: Medicare HMO | Admitting: Psychiatry

## 2022-07-03 DIAGNOSIS — I2510 Atherosclerotic heart disease of native coronary artery without angina pectoris: Secondary | ICD-10-CM | POA: Diagnosis not present

## 2022-07-03 DIAGNOSIS — F3341 Major depressive disorder, recurrent, in partial remission: Secondary | ICD-10-CM | POA: Diagnosis not present

## 2022-07-03 DIAGNOSIS — Z0001 Encounter for general adult medical examination with abnormal findings: Secondary | ICD-10-CM | POA: Diagnosis not present

## 2022-07-03 DIAGNOSIS — G4733 Obstructive sleep apnea (adult) (pediatric): Secondary | ICD-10-CM | POA: Diagnosis not present

## 2022-07-03 DIAGNOSIS — E1165 Type 2 diabetes mellitus with hyperglycemia: Secondary | ICD-10-CM | POA: Diagnosis not present

## 2022-07-03 DIAGNOSIS — G4730 Sleep apnea, unspecified: Secondary | ICD-10-CM | POA: Diagnosis not present

## 2022-07-03 DIAGNOSIS — E7849 Other hyperlipidemia: Secondary | ICD-10-CM | POA: Diagnosis not present

## 2022-07-03 DIAGNOSIS — N1831 Chronic kidney disease, stage 3a: Secondary | ICD-10-CM | POA: Diagnosis not present

## 2022-07-03 DIAGNOSIS — F17210 Nicotine dependence, cigarettes, uncomplicated: Secondary | ICD-10-CM | POA: Diagnosis not present

## 2022-07-03 DIAGNOSIS — I10 Essential (primary) hypertension: Secondary | ICD-10-CM | POA: Diagnosis not present

## 2022-07-03 NOTE — Patient Instructions (Signed)
Continue duloxetine 60 mg daily  Start bupropion 150 mg daily Next appointment:  12/20 at 3:30

## 2022-07-12 ENCOUNTER — Encounter: Payer: Self-pay | Admitting: Physical Medicine & Rehabilitation

## 2022-07-12 ENCOUNTER — Encounter: Payer: Medicare HMO | Attending: Physical Medicine & Rehabilitation | Admitting: Physical Medicine & Rehabilitation

## 2022-07-12 VITALS — BP 104/67 | HR 75 | Ht 71.0 in | Wt 180.0 lb

## 2022-07-12 DIAGNOSIS — M7552 Bursitis of left shoulder: Secondary | ICD-10-CM | POA: Insufficient documentation

## 2022-07-12 MED ORDER — LIDOCAINE HCL 1 % IJ SOLN: 4.0000 mL | Freq: Once | INTRAMUSCULAR | Status: AC

## 2022-07-12 MED ORDER — BETAMETHASONE SOD PHOS & ACET 6 (3-3) MG/ML IJ SUSP: 12.0000 mg | Freq: Once | INTRAMUSCULAR | Status: AC

## 2022-07-12 NOTE — Progress Notes (Signed)
Shoulder injectionLeft subacromial  Without  ultrasound guidance  Indication:Left Shoulder pain not relieved by medication management and other conservative care.  Informed consent was obtained after describing risks and benefits of the procedure with the patient, this includes bleeding, bruising, infection and medication side effects. The patient wishes to proceed and has given written consent. Patient was placed in a seated position. TheLeft shoulder was marked and prepped with betadine in the subacromial area. A 25-gauge 1-1/2 inch needle was inserted into the subacromial area. After negative draw back for blood, a solution containing 1 mL of 6 mg per ML betamethasone and 4 mL of 1% lidocaine was injected. A band aid was applied. The patient tolerated the procedure well. Post procedure instructions were given.

## 2022-07-14 DIAGNOSIS — E782 Mixed hyperlipidemia: Secondary | ICD-10-CM | POA: Diagnosis not present

## 2022-07-14 DIAGNOSIS — E1165 Type 2 diabetes mellitus with hyperglycemia: Secondary | ICD-10-CM | POA: Diagnosis not present

## 2022-07-14 DIAGNOSIS — I10 Essential (primary) hypertension: Secondary | ICD-10-CM | POA: Diagnosis not present

## 2022-07-14 DIAGNOSIS — J449 Chronic obstructive pulmonary disease, unspecified: Secondary | ICD-10-CM | POA: Diagnosis not present

## 2022-07-25 ENCOUNTER — Other Ambulatory Visit: Payer: Self-pay | Admitting: Cardiology

## 2022-07-25 DIAGNOSIS — F1120 Opioid dependence, uncomplicated: Secondary | ICD-10-CM | POA: Diagnosis not present

## 2022-07-25 DIAGNOSIS — E1165 Type 2 diabetes mellitus with hyperglycemia: Secondary | ICD-10-CM | POA: Diagnosis not present

## 2022-07-25 DIAGNOSIS — R030 Elevated blood-pressure reading, without diagnosis of hypertension: Secondary | ICD-10-CM | POA: Diagnosis not present

## 2022-07-25 DIAGNOSIS — M79662 Pain in left lower leg: Secondary | ICD-10-CM | POA: Diagnosis not present

## 2022-07-25 DIAGNOSIS — M545 Low back pain, unspecified: Secondary | ICD-10-CM | POA: Diagnosis not present

## 2022-07-25 DIAGNOSIS — F17210 Nicotine dependence, cigarettes, uncomplicated: Secondary | ICD-10-CM | POA: Diagnosis not present

## 2022-07-25 DIAGNOSIS — Z89421 Acquired absence of other right toe(s): Secondary | ICD-10-CM | POA: Diagnosis not present

## 2022-07-25 DIAGNOSIS — I639 Cerebral infarction, unspecified: Secondary | ICD-10-CM | POA: Diagnosis not present

## 2022-07-25 DIAGNOSIS — E663 Overweight: Secondary | ICD-10-CM | POA: Diagnosis not present

## 2022-07-25 DIAGNOSIS — Z79899 Other long term (current) drug therapy: Secondary | ICD-10-CM | POA: Diagnosis not present

## 2022-07-25 DIAGNOSIS — G894 Chronic pain syndrome: Secondary | ICD-10-CM | POA: Diagnosis not present

## 2022-07-31 ENCOUNTER — Other Ambulatory Visit
Admission: RE | Admit: 2022-07-31 | Discharge: 2022-07-31 | Disposition: A | Discharge status: Home / Self Care | Payer: Medicare HMO | Source: Ambulatory Visit | Attending: Medical | Admitting: Medical

## 2022-07-31 ENCOUNTER — Ambulatory Visit (INDEPENDENT_AMBULATORY_CARE_PROVIDER_SITE_OTHER): Payer: Medicare HMO | Admitting: Medical

## 2022-07-31 ENCOUNTER — Encounter: Payer: Self-pay | Admitting: Medical

## 2022-07-31 VITALS — BP 98/50 | HR 81 | Ht 71.0 in | Wt 188.0 lb

## 2022-07-31 DIAGNOSIS — Z7902 Long term (current) use of antithrombotics/antiplatelets: Secondary | ICD-10-CM | POA: Diagnosis not present

## 2022-07-31 DIAGNOSIS — I69854 Hemiplegia and hemiparesis following other cerebrovascular disease affecting left non-dominant side: Secondary | ICD-10-CM | POA: Insufficient documentation

## 2022-07-31 DIAGNOSIS — I5020 Unspecified systolic (congestive) heart failure: Secondary | ICD-10-CM | POA: Insufficient documentation

## 2022-07-31 DIAGNOSIS — E1122 Type 2 diabetes mellitus with diabetic chronic kidney disease: Secondary | ICD-10-CM | POA: Diagnosis not present

## 2022-07-31 DIAGNOSIS — E782 Mixed hyperlipidemia: Secondary | ICD-10-CM

## 2022-07-31 DIAGNOSIS — I2510 Atherosclerotic heart disease of native coronary artery without angina pectoris: Secondary | ICD-10-CM | POA: Diagnosis not present

## 2022-07-31 DIAGNOSIS — Z79899 Other long term (current) drug therapy: Secondary | ICD-10-CM | POA: Diagnosis not present

## 2022-07-31 DIAGNOSIS — Z951 Presence of aortocoronary bypass graft: Secondary | ICD-10-CM | POA: Insufficient documentation

## 2022-07-31 DIAGNOSIS — I4891 Unspecified atrial fibrillation: Secondary | ICD-10-CM | POA: Insufficient documentation

## 2022-07-31 DIAGNOSIS — I130 Hypertensive heart and chronic kidney disease with heart failure and stage 1 through stage 4 chronic kidney disease, or unspecified chronic kidney disease: Secondary | ICD-10-CM | POA: Diagnosis not present

## 2022-07-31 DIAGNOSIS — Z8249 Family history of ischemic heart disease and other diseases of the circulatory system: Secondary | ICD-10-CM | POA: Diagnosis not present

## 2022-07-31 DIAGNOSIS — G4733 Obstructive sleep apnea (adult) (pediatric): Secondary | ICD-10-CM | POA: Insufficient documentation

## 2022-07-31 DIAGNOSIS — I255 Ischemic cardiomyopathy: Secondary | ICD-10-CM

## 2022-07-31 DIAGNOSIS — Z7982 Long term (current) use of aspirin: Secondary | ICD-10-CM | POA: Insufficient documentation

## 2022-07-31 DIAGNOSIS — F17210 Nicotine dependence, cigarettes, uncomplicated: Secondary | ICD-10-CM | POA: Diagnosis not present

## 2022-07-31 DIAGNOSIS — Z8546 Personal history of malignant neoplasm of prostate: Secondary | ICD-10-CM | POA: Insufficient documentation

## 2022-07-31 DIAGNOSIS — N183 Chronic kidney disease, stage 3 unspecified: Secondary | ICD-10-CM | POA: Insufficient documentation

## 2022-07-31 LAB — LDL CHOLESTEROL, DIRECT: Direct LDL: 103 mg/dL — ABNORMAL HIGH (ref 0–99)

## 2022-07-31 NOTE — Patient Instructions (Signed)
Medication Instructions:  Your physician recommends that you continue on your current medications as directed. Please refer to the Current Medication list given to you today.   Labwork: Lipids  Testing/Procedures: Your physician has requested that you have an echocardiogram. Echocardiography is a painless test that uses sound waves to create images of your heart. It provides your doctor with information about the size and shape of your heart and how well your heart's chambers and valves are working. This procedure takes approximately one hour. There are no restrictions for this procedure. Please do NOT wear cologne, perfume, aftershave, or lotions (deodorant is allowed). Please arrive 15 minutes prior to your appointment time.   Follow-Up: 6 months  Any Other Special Instructions Will Be Listed Below (If Applicable).  If you need a refill on your cardiac medications before your next appointment, please call your pharmacy.

## 2022-07-31 NOTE — Progress Notes (Signed)
Cardiology Office Note:    Date:  07/31/2022   ID:  [name], DOB [birth year], MRN [id number]  PCP:  Caryl Bis, MD  [facility] Cardiologist:  Rozann Lesches, MD  [facility] Electrophysiologist:  None   Referring MD: Caryl Bis, MD   Chief Complaint: 1 year follow-up  History of Present Illness:    [name] is a 57 y.o. 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  Started following with Dr. Domenic Polite in 2021.  He was previously seen at [facility].  He has a history of CAD status post CABG with patent bypass grafts in 2015.  Lexiscan Myoview from December 2018 showed large defect involving the apex and large portion of the Pence apical anterior wall.  No evidence of ischemia.  Wall motion analysis revealed apical dyskinesis and akinesis, LVEF estimated 18%.  Echo from December 2020 showed LVEF 40 to 50%, no LVH, hypokinesis of the anteroseptal wall and apex, mild AI.  Patient was last seen September 2022 for preoperative cardiac evaluation.  Echo was ordered to reassess pump function.  Echo showed LVEF 45 to 50%, mild LVH, grade 2 diastolic dysfunction, normal RV SF, trivial MR.  Admitted in April 2023 to [facility] with AMS and respiratory failure requiring intubation, on pressors intermittently. He was treated for PNA.Afib RVR reported by EMS, but no EKG showed Afib. Echo showed LVEF 35-40%, mild to mod AI. He left AMA  Today, the patient reports he is ok from a cardiac perspective. He has been using a wheelchair since stroke in 2016. Hospitalization win April 2023 note reported possible afib, but there was no EKG confirming this. He has chronic lower leg edema. No chest pain, SOB, lightheadedness or dizziness. Medications were reviewed.    Past Medical History:   Diagnosis Date   Anaphylaxis    IGE mediated   Anxiety    Arthritis    Cancer ([place])    Prostate   Cardiomyopathy ([place])    CHF (congestive heart failure) (HCC)    COPD (chronic obstructive pulmonary disease) ([place])    Coronary atherosclerosis of native coronary artery    Multivessel status post CABG 2010   Depression    Essential hypertension    History of CVA (cerebrovascular accident) 01/2012   Right posterior frontal cortical and subcortical brain by MRI, no hemorrhage. Carotid Dopplers showed only 1-50% bilateral ICA stenoses. Echocardiogram showed LVEF 50%, no major valvular abnormalities.   History of kidney stones    Mixed hyperlipidemia    Myocardial infarction ([place]) 2010   OSA (obstructive sleep apnea)    Pneumonia    Prostate cancer ([place]) 12/2020   Dr. Tresa Moore at [facility]   Stroke [facility])    Type 2 diabetes mellitus [facility])     Past Surgical History:  Procedure Laterality Date   CHOLECYSTECTOMY     CORONARY ARTERY BYPASS GRAFT  2010   LIMA to LAD, SVG to diagonal, SVG to OM1 and OM 2, SVG to RCA   DENTAL SURGERY  2003   GASTRIC BYPASS  2010   HERNIA REPAIR  2011, 2012   Ruch N/A 07/23/2013   Procedure: HERNIA REPAIR INCISIONAL WITH MESH;  Surgeon: Jamesetta So, MD;  Location: [facility]S;  Service: General;  Laterality: N/A;  INSERTION OF MESH N/A 07/23/2013   Procedure: INSERTION OF MESH;  Surgeon: Jamesetta So, MD;  Location: [facility];  Service: General;  Laterality: N/A;   LEFT HEART CATHETERIZATION WITH CORONARY/GRAFT ANGIOGRAM N/A 11/01/2013   Procedure: LEFT HEART CATHETERIZATION WITH Beatrix Fetters;  Surgeon: Blane Ohara, MD;  Location: [facility] CATH LAB;  Service: Cardiovascular;  Laterality: N/A;   LYMPH NODE DISSECTION Bilateral 04/04/2021   Procedure: PELVIC LYMPH NODE DISSECTION;  Surgeon: Alexis Frock, MD;  Location: [facility];  Service: Urology;  Laterality: Bilateral;   ROBOT ASSISTED LAPAROSCOPIC RADICAL PROSTATECTOMY N/A  04/04/2021   Procedure: XI ROBOTIC ASSISTED LAPAROSCOPIC RADICAL PROSTATECTOMY WITH INDOCYANINE GREEN DYE;  Surgeon: Alexis Frock, MD;  Location: [facility];  Service: Urology;  Laterality: N/A;  3 HRS   TOE AMPUTATION  1998   right 1st and 2nd toe   TONSILECTOMY, ADENOIDECTOMY, BILATERAL MYRINGOTOMY AND TUBES     TONSILLECTOMY     VENTRAL HERNIA REPAIR N/A 10/28/2012   Procedure: LAPAROSCOPIC VENTRAL HERNIA;  Surgeon: Donato Heinz, MD;  Location: [facility];  Service: General;  Laterality: N/A;    Current Medications: Current Meds  Medication Sig   acetaminophen (TYLENOL) 500 MG tablet Take 1,000 mg by mouth every 6 (six) hours as needed for moderate pain or headache.   albuterol (PROVENTIL HFA;VENTOLIN HFA) 108 (90 BASE) MCG/ACT inhaler Inhale 2 puffs into the lungs every 6 (six) hours as needed for wheezing or shortness of breath.   ALPRAZolam (XANAX) 1 MG tablet Take 1 mg by mouth 2 (two) times daily as needed.   aspirin EC 81 MG tablet Take 1 tablet (81 mg total) by mouth daily.   atorvastatin (LIPITOR) 40 MG tablet TAKE 1 TABLET ONCE DAILY.   baclofen (LIORESAL) 20 MG tablet Take by mouth.   bisacodyl (DULCOLAX) 5 MG EC tablet Take by mouth.   carvedilol (COREG) 3.125 MG tablet TAKE (1) TABLET TWICE DAILY.   Cholecalciferol (VITAMIN D) 50 MCG (2000 UT) CAPS 1 capsule   clopidogrel (PLAVIX) 75 MG tablet [id number]  Plavix 75 mg tablet 75 milligrams PO once a day for ? CVA; 1 tab po daily   dapsone 25 MG tablet Take by mouth.   dibucaine (NUPERCAINAL) 1 % OINT 1 application as needed   diltiazem (CARDIZEM CD) 120 MG 24 hr capsule Take by mouth.   diltiazem (CARDIZEM CD) 240 MG 24 hr capsule Take 240 mg by mouth daily.    docusate sodium (COLACE) 100 MG capsule Take 1 capsule (100 mg total) by mouth 2 (two) times daily.   DULoxetine (CYMBALTA) 60 MG capsule Take 1 capsule (60 mg total) by mouth daily.   ENTRESTO 49-51 MG TAKE (1) TABLET TWICE DAILY.   fenofibrate micronized (LOFIBRA) 134  MG capsule Take 134 mg by mouth daily before breakfast.   fluconazole (DIFLUCAN) 200 MG tablet Take by mouth.   fluticasone (CUTIVATE) 0.05 % cream Apply 1 application topically 2 (two) times daily.   fluticasone (FLONASE) 50 MCG/ACT nasal spray [id number]  Flonase Allergy Relief 50 mcg/actuation spray 1 spray(s) INH as directed for Allergy; 1 spray each nostril daily   furosemide (LASIX) 20 MG tablet Take 20 mg by mouth daily.    HYDROcodone-acetaminophen (NORCO/VICODIN) 5-325 MG tablet Take 1 tablet by mouth 3 (three) times daily as needed for moderate pain.   hydrocortisone (ANUSOL-HC) 25 MG suppository 1 suppository   isosorbide mononitrate (IMDUR) 30 MG 24 hr tablet 1 tablet in the morning   isosorbide mononitrate (IMDUR) 60  MG 24 hr tablet Take 60 mg by mouth 2 (two) times daily.    meclizine (ANTIVERT) 25 MG tablet Take by mouth.   midodrine (PROAMATINE) 10 MG tablet [id number]  midodrine 10 mg tablet 10 milligrams PO three times a day for Hypotension; 1 tab po TID   naloxone (NARCAN) nasal spray 4 mg/0.1 mL Place 1 spray into the nose as needed (opioid overdose).   nicotine (NICODERM CQ - DOSED IN MG/24 HOURS) 21 mg/24hr patch 21 mg daily.   nicotine polacrilex (NICORETTE) 4 MG gum Place inside cheek.   nitroGLYCERIN (NITROSTAT) 0.4 MG SL tablet 1 tablet as directed   nystatin (MYCOSTATIN) 100000 UNIT/ML suspension Take by mouth.   ondansetron (ZOFRAN) 8 MG tablet Take 8 mg by mouth 3 (three) times daily.   ondansetron (ZOFRAN-ODT) 8 MG disintegrating tablet Take 8 mg by mouth every 6 (six) hours as needed.   pantoprazole (PROTONIX) 40 MG tablet Take by mouth.   polyethylene glycol powder (GLYCOLAX/MIRALAX) 17 GM/SCOOP powder Take 17 g by mouth daily.   pregabalin (LYRICA) 75 MG capsule TAKE 1 CAPSULE BY MOUTH TWICE DAILY.   Sennosides-Docusate Sodium (SENOKOT S PO) [id number]  Senokot-S 8.6-50 mg tablet 4 tab(s) PO three times a day as needed for Constipation; 4 tabs po TID PRN   silver  sulfADIAZINE (SILVADENE) 1 % cream Apply 1 application topically 3 (three) times daily.   tamsulosin (FLOMAX) 0.4 MG CAPS capsule Take 0.4 mg by mouth.   tiZANidine (ZANAFLEX) 2 MG tablet Take by mouth every 6 (six) hours as needed for muscle spasms.   traZODone (DESYREL) 50 MG tablet Take 50 mg by mouth at bedtime.   triamcinolone cream (KENALOG) 0.1 % Apply 1 application topically 2 (two) times daily.   varenicline (CHANTIX) 1 MG tablet Take by mouth.   Current Facility-Administered Medications for the 07/31/22 encounter (Office Visit) with Kathlen Mody, Taylour Lietzke H, PA-C  Medication   betamethasone acetate-betamethasone sodium phosphate (CELESTONE) injection 12 mg   lidocaine (XYLOCAINE) 1 % (with pres) injection 4 mL     Allergies:   Ibuprofen, Iodinated contrast media, Nsaids, Ace inhibitors, Chantix [varenicline], Other, and Pollen extract   Social History   Socioeconomic History   Marital status: Legally Separated    Spouse name: Joy Haegele   Number of children: 2   Years of education: 12   Highest education level: 12th grade  Occupational History   Occupation: disabled  Tobacco Use   Smoking status: Every Day    Packs/day: 1.00    Years: 30.00    Total pack years: 30.00    Types: Cigarettes    Start date: 06/08/1982    Passive exposure: Current   Smokeless tobacco: Never   Tobacco comments:    1 pack per day 03/04/2018  Vaping Use   Vaping Use: Never used  Substance and Sexual Activity   Alcohol use: Yes    Comment: occassionally   Drug use: No   Sexual activity: Yes    Birth control/protection: None  Other Topics Concern   Not on file  Social History Narrative   Disabled since age 66 lives with wife and children      Patient is right-handed. He is married, lives in a 1 story house, has a ramp to enter. Drinks 64oz of Mtn. Dew a day. Prior to CVA was walking daily. 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      Social Determinants of Health    Financial Resource Strain:  Low Risk  (02/10/2022)   Overall Financial Resource Strain (CARDIA)    Difficulty of Paying Living Expenses: Not hard at all  Food Insecurity: No Food Insecurity (02/10/2022)   Hunger Vital Sign    Worried About Running Out of Food in the Last Year: Never true    Ran Out of Food in the Last Year: Never true  Transportation Needs: No Transportation Needs (02/10/2022)   PRAPARE - Hydrologist (Medical): No    Lack of Transportation (Non-Medical): No  Physical Activity: Inactive (02/10/2022)   Exercise Vital Sign    Days of Exercise per Week: 0 days    Minutes of Exercise per Session: 0 min  Stress: No Stress Concern Present (02/10/2022)   [place]    Feeling of Stress : Only a little  Social Connections: Moderately Isolated (02/10/2022)   Social Connection and Isolation Panel [NHANES]    Frequency of Communication with Friends and Family: More than three times a week    Frequency of Social Gatherings with Friends and Family: More than three times a week    Attends Religious Services: 1 to 4 times per year    Active Member of [employer] or Organizations: No    Attends Archivist Meetings: Never    Marital Status: Separated     Family History: The patient's family history includes Alcohol abuse in his brother; Breast cancer in his maternal aunt; Congestive Heart Failure in his mother; Diabetes in his mother; Heart disease in his father and mother; Hyperlipidemia in his mother; Lung cancer in his father; Suicidality in his cousin.  ROS:   Please see the history of present illness.     All other systems reviewed and are negative.  EKGs/Labs/Other Studies Reviewed:    The following studies were reviewed today:  Echo 03/2021  1. Left ventricular ejection fraction, by estimation, is 45 to 50%. The  left ventricle has mildly decreased function. The left ventricle   demonstrates regional wall motion abnormalities (see scoring  diagram/findings for description). There is mild left  ventricular hypertrophy. Left ventricular diastolic parameters are  consistent with Grade II diastolic dysfunction (pseudonormalization). No  obvious LV mural thrombus.   2. Right ventricular systolic function is normal. The right ventricular  size is normal. Tricuspid regurgitation signal is inadequate for assessing  PA pressure.   3. The mitral valve is grossly normal. Trivial mitral valve  regurgitation.   4. The aortic valve is tricuspid. Aortic valve regurgitation is mild.  Mild aortic valve sclerosis is present, with no evidence of aortic valve  stenosis.   5. The inferior vena cava is normal in size with greater than 50%  respiratory variability, suggesting right atrial pressure of 3 mmHg.   Comparison(s): Prior images reviewed side by side. LVEF better defined by  the present study.   Echo 05/2020  1. Very limited images. Suggest Definity contrast with next study.   2. Left ventricular ejection fraction, by estimation, is approximately  45%. The left ventricle has mildly decreased function. Left ventricular  endocardial border not optimally defined to evaluate regional wall motion,  but there anteroseptal hypokinesis  to akinesis noted. There is mild left ventricular hypertrophy. Left  ventricular diastolic parameters were normal.   3. Right ventricular systolic function is normal. The right ventricular  size is normal.   4. The mitral valve is grossly normal. Trivial mitral valve  regurgitation.   5. The aortic  valve is tricuspid. Aortic valve regurgitation is trivial.   6. Unable to estimate CVP.   Echo 06/2019 1. Left ventricular ejection fraction, by visual estimation, is 40-50%.  The left ventricle has Low normal to mildly decreased function. There is  no left ventricular hypertrophy.   2. Left ventricular diastolic parameters are indeterminate.    3. Very difficult visualization of the LV, difficult interpretation of  LVEF and wall motion. Roughly estimated LVEF is in the 40-50% range. There  is probable hypokinesis of the anteroseptal wall and apex. Recommend  contrast study for better evaluation.   4. Global right ventricle was not well visualized.The right ventricular  size is not well visualized. Right vetricular wall thickness was not  assessed.   5. Left atrial size was not well visualized.   6. Right atrial size was not well visualized.   7. The pericardium was not well visualized.   8. The mitral valve was not well visualized. No evidence of mitral valve  regurgitation. No evidence of mitral stenosis.   9. The tricuspid valve is not well visualized. Tricuspid valve  regurgitation is not demonstrated.  10. The aortic valve was not well visualized. Aortic valve regurgitation  is mild. No evidence of aortic valve sclerosis or stenosis.  11. The pulmonic valve was not well visualized. Pulmonic valve  regurgitation is not visualized.  12. The interatrial septum was not well visualized.   Myoview lexiscan 2014 EF improved to 40%, compared to prior study in 2009 (EF 33%). Evidence of prior MI, with minor peri infarct ischemia. Recent echo yielded EF 40-45%. In the absence of sxs of CP, recommend pt to proceed with surgery, as planned. He is at an acceptable risk from a cardiac standpoint. Plavix may be held for 5 days prior, and then resumed post op. Please notify pt of this result and recommendation.    EKG:  EKG is not ordered today.   Recent Labs: 09/27/2021: BUN 22; Creatinine, Ser 1.47; Hemoglobin 14.0; Platelets 240; Potassium 3.6; Sodium 140  Recent Lipid Panel    Component Value Date/Time   CHOL 156 11/01/2013 0710   TRIG 185 (H) 11/01/2013 0710   HDL 27 (L) 11/01/2013 0710   CHOLHDL 5.8 11/01/2013 0710   VLDL 37 11/01/2013 0710   LDLCALC 92 11/01/2013 0710   Physical Exam:    VS:  BP (!) 98/50   Pulse 81   Ht  '5\' 11"'$  (1.803 m)   Wt 188 lb (85.3 kg)   SpO2 98%   BMI 26.22 kg/m     Wt Readings from Last 3 Encounters:  07/31/22 188 lb (85.3 kg)  07/12/22 180 lb (81.6 kg)  05/28/22 182 lb 6.4 oz (82.7 kg)     GEN:  Well nourished, well developed in no acute distress HEENT: Normal NECK: No JVD; No carotid bruits LYMPHATICS: No lymphadenopathy CARDIAC: RRR, no murmurs, rubs, gallops RESPIRATORY:  Clear to auscultation without rales, wheezing or rhonchi  ABDOMEN: Soft, non-tender, non-distended MUSCULOSKELETAL:  No edema; No deformity  SKIN: Warm and dry NEUROLOGIC:  Alert and oriented x 3 PSYCHIATRIC:  Normal affect   ASSESSMENT:    1. HFrEF (heart failure with reduced ejection fraction) ([place])   2. Ischemic cardiomyopathy   3. Mixed hyperlipidemia    PLAN:    In order of problems listed above:  ICM HFrEF Echo in 2022 showed stable LVEF 45%. He is euvolemic on exam. During hospitalization echo showed LVEF 35-40%. Continue Coreg 3.'125mg'$  BID, Entresto 49-'51mg'$  BID, lasix  $'20mg'T$  daily, midodrine '10mg'$ . BP limiting GDMT. I will update an echocardiogram.   CAD s/p CABG x 4 in 2010 Patient denies anginal symptoms, although he is not active at baseline. Continue Aspirin, Plavix, Lipitor, Coreg, Imdur, Entresto and SL NTG. No further ischemic work-up at this time.   CKD stage 3 Stable labs in 10/2021.  Mixed HLD Continue Lipitor '40mg'$  daily. I will update cholesterol panel today.  Disposition: Follow up in 6 month(s) with MD/[facility]    Signed, Yvonda Fouty Ninfa Meeker, PA-C  07/31/2022 4:23 PM    [facility]

## 2022-08-02 ENCOUNTER — Telehealth: Payer: Self-pay | Admitting: *Deleted

## 2022-08-02 MED ORDER — ATORVASTATIN CALCIUM 80 MG PO TABS: 80.0000 mg | ORAL_TABLET | Freq: Every day | ORAL | 3 refills | Status: AC

## 2022-08-02 NOTE — Telephone Encounter (Signed)
-----  Message from Emily Filbert, RN sent at 08/02/2022  8:38 AM EST -----  ----- Message ----- From: Antony Madura, PA-C Sent: 08/01/2022   9:20 PM EST To: [facility] Triage  LDL is above goal of 70. Increase Lipitor to '80mg'$  daily.

## 2022-08-02 NOTE — Telephone Encounter (Signed)
Pt notified and order placed 

## 2022-08-05 DIAGNOSIS — N393 Stress incontinence (female) (male): Secondary | ICD-10-CM | POA: Diagnosis not present

## 2022-08-05 DIAGNOSIS — C775 Secondary and unspecified malignant neoplasm of intrapelvic lymph nodes: Secondary | ICD-10-CM | POA: Diagnosis not present

## 2022-08-05 DIAGNOSIS — C61 Malignant neoplasm of prostate: Secondary | ICD-10-CM | POA: Diagnosis not present

## 2022-08-05 DIAGNOSIS — N5231 Erectile dysfunction following radical prostatectomy: Secondary | ICD-10-CM | POA: Diagnosis not present

## 2022-08-13 DIAGNOSIS — Z5181 Encounter for therapeutic drug level monitoring: Secondary | ICD-10-CM | POA: Diagnosis not present

## 2022-08-13 DIAGNOSIS — Z1159 Encounter for screening for other viral diseases: Secondary | ICD-10-CM | POA: Diagnosis not present

## 2022-08-13 DIAGNOSIS — L732 Hidradenitis suppurativa: Secondary | ICD-10-CM | POA: Diagnosis not present

## 2022-08-15 ENCOUNTER — Ambulatory Visit: Payer: Medicare HMO | Attending: Medical

## 2022-08-15 DIAGNOSIS — I5020 Unspecified systolic (congestive) heart failure: Secondary | ICD-10-CM

## 2022-08-15 MED ORDER — PERFLUTREN LIPID MICROSPHERE
1.0000 mL | INTRAVENOUS | Status: AC | PRN
  Administered 2022-08-15: 2 mL via INTRAVENOUS

## 2022-08-16 LAB — ECHOCARDIOGRAM COMPLETE
AR max vel: 2.07 cm2
AV Area VTI: 2.34 cm2
AV Area mean vel: 2.32 cm2
AV Mean grad: 5.3 mmHg
AV Peak grad: 11.2 mmHg
AV Vena cont: 0.3 cm
Ao pk vel: 1.67 m/s
Area-P 1/2: 2.79 cm2
Calc EF: 48 %
P 1/2 time: 701 msec
S' Lateral: 3.7 cm
Single Plane A2C EF: 53.8 %
Single Plane A4C EF: 45.8 %

## 2022-08-19 ENCOUNTER — Telehealth: Payer: Self-pay

## 2022-08-19 NOTE — Telephone Encounter (Signed)
Patient notified and verbalized understanding. Patient had no further questions at this time. PCP copied.

## 2022-08-19 NOTE — Telephone Encounter (Signed)
-----   Message from [place], PA-C sent at 08/19/2022  9:26 AM EST ----- Echo showed mildly reduced pump function, however this is improved from prior. Continue current medications

## 2022-08-23 ENCOUNTER — Encounter: Payer: Self-pay | Admitting: Physical Medicine & Rehabilitation

## 2022-08-23 ENCOUNTER — Encounter: Payer: Medicare HMO | Attending: Physical Medicine & Rehabilitation | Admitting: Physical Medicine & Rehabilitation

## 2022-08-23 VITALS — BP 99/63 | HR 68 | Temp 98.2°F | Ht 71.0 in | Wt 186.0 lb

## 2022-08-23 DIAGNOSIS — G8110 Spastic hemiplegia affecting unspecified side: Secondary | ICD-10-CM | POA: Diagnosis not present

## 2022-08-23 MED ORDER — ABOBOTULINUMTOXINA 500 UNITS IM SOLR
500.0000 [IU] | Freq: Once | INTRAMUSCULAR | Status: AC
  Administered 2022-08-23: 500 [IU] via INTRAMUSCULAR

## 2022-08-23 NOTE — Progress Notes (Signed)
Dysport Injection for spasticity using needle EMG guidance  Dilution: 200 Units/ml Indication: Severe spasticity which interferes with ADL,mobility and/or  hygiene and is unresponsive to medication management and other conservative care Informed consent was obtained after describing risks and benefits of the procedure with the patient. This includes bleeding, bruising, infection, excessive weakness, or medication side effects. A REMS form is on file and signed. Needle:  needle electrode Number of units per muscle L OP 100 L FCR 100 L FDP 100  L Post Tib 100 L soleus 100 All injections were done after obtaining appropriate EMG activity and after negative drawback for blood. The patient tolerated the procedure well. Post procedure instructions were given. A followup appointment was made.

## 2022-08-23 NOTE — Patient Instructions (Signed)

## 2022-08-26 DIAGNOSIS — M545 Low back pain, unspecified: Secondary | ICD-10-CM | POA: Diagnosis not present

## 2022-08-26 DIAGNOSIS — M79662 Pain in left lower leg: Secondary | ICD-10-CM | POA: Diagnosis not present

## 2022-08-26 DIAGNOSIS — R030 Elevated blood-pressure reading, without diagnosis of hypertension: Secondary | ICD-10-CM | POA: Diagnosis not present

## 2022-08-26 DIAGNOSIS — F1120 Opioid dependence, uncomplicated: Secondary | ICD-10-CM | POA: Diagnosis not present

## 2022-08-26 DIAGNOSIS — F17210 Nicotine dependence, cigarettes, uncomplicated: Secondary | ICD-10-CM | POA: Diagnosis not present

## 2022-08-26 DIAGNOSIS — G894 Chronic pain syndrome: Secondary | ICD-10-CM | POA: Diagnosis not present

## 2022-08-26 DIAGNOSIS — N528 Other male erectile dysfunction: Secondary | ICD-10-CM | POA: Diagnosis not present

## 2022-08-26 DIAGNOSIS — E1165 Type 2 diabetes mellitus with hyperglycemia: Secondary | ICD-10-CM | POA: Diagnosis not present

## 2022-08-26 DIAGNOSIS — E663 Overweight: Secondary | ICD-10-CM | POA: Diagnosis not present

## 2022-08-26 DIAGNOSIS — I639 Cerebral infarction, unspecified: Secondary | ICD-10-CM | POA: Diagnosis not present

## 2022-08-26 DIAGNOSIS — Z79899 Other long term (current) drug therapy: Secondary | ICD-10-CM | POA: Diagnosis not present

## 2022-09-18 ENCOUNTER — Other Ambulatory Visit: Payer: Self-pay | Admitting: Cardiology

## 2022-09-24 DIAGNOSIS — M79662 Pain in left lower leg: Secondary | ICD-10-CM | POA: Diagnosis not present

## 2022-09-24 DIAGNOSIS — I639 Cerebral infarction, unspecified: Secondary | ICD-10-CM | POA: Diagnosis not present

## 2022-09-24 DIAGNOSIS — E1165 Type 2 diabetes mellitus with hyperglycemia: Secondary | ICD-10-CM | POA: Diagnosis not present

## 2022-09-24 DIAGNOSIS — Z79899 Other long term (current) drug therapy: Secondary | ICD-10-CM | POA: Diagnosis not present

## 2022-09-24 DIAGNOSIS — R030 Elevated blood-pressure reading, without diagnosis of hypertension: Secondary | ICD-10-CM | POA: Diagnosis not present

## 2022-09-24 DIAGNOSIS — F17210 Nicotine dependence, cigarettes, uncomplicated: Secondary | ICD-10-CM | POA: Diagnosis not present

## 2022-09-24 DIAGNOSIS — F1120 Opioid dependence, uncomplicated: Secondary | ICD-10-CM | POA: Diagnosis not present

## 2022-09-24 DIAGNOSIS — G894 Chronic pain syndrome: Secondary | ICD-10-CM | POA: Diagnosis not present

## 2022-09-24 DIAGNOSIS — E663 Overweight: Secondary | ICD-10-CM | POA: Diagnosis not present

## 2022-09-24 DIAGNOSIS — M545 Low back pain, unspecified: Secondary | ICD-10-CM | POA: Diagnosis not present

## 2022-09-27 DIAGNOSIS — Z79899 Other long term (current) drug therapy: Secondary | ICD-10-CM | POA: Diagnosis not present

## 2022-10-04 ENCOUNTER — Encounter: Payer: Medicare HMO | Attending: Physical Medicine & Rehabilitation | Admitting: Physical Medicine & Rehabilitation

## 2022-10-04 ENCOUNTER — Encounter: Payer: Self-pay | Admitting: Physical Medicine & Rehabilitation

## 2022-10-04 VITALS — BP 98/63 | HR 72 | Temp 98.0°F | Ht 71.0 in

## 2022-10-04 DIAGNOSIS — M7552 Bursitis of left shoulder: Secondary | ICD-10-CM | POA: Insufficient documentation

## 2022-10-04 MED ORDER — TIZANIDINE HCL 2 MG PO TABS: 2.0000 mg | ORAL_TABLET | Freq: Every day | ORAL | 2 refills | Status: DC

## 2022-10-04 MED ORDER — LIDOCAINE HCL 1 % IJ SOLN
5.0000 mL | Freq: Once | INTRAMUSCULAR | Status: AC
  Administered 2022-10-04: 5 mL

## 2022-10-04 MED ORDER — BETAMETHASONE SOD PHOS & ACET 6 (3-3) MG/ML IJ SUSP
6.0000 mg | Freq: Once | INTRAMUSCULAR | Status: AC
  Administered 2022-10-04: 6 mg via INTRA_ARTICULAR

## 2022-10-04 NOTE — Progress Notes (Signed)
Shoulder injectionLeft  Without ultrasound guidance)  Indication:LEFT Shoulder pain not relieved by medication management and other conservative care.  Informed consent was obtained after describing risks and benefits of the procedure with the patient, this includes bleeding, bruising, infection and medication side effects. The patient wishes to proceed and has given written consent. Patient was placed in a seated position. The LEFT shoulder was marked and prepped with betadine in the subacromial area. A 25-gauge 1-1/2 inch needle was inserted into the subacromial area. After negative draw back for blood, a solution containing 1 mL of 6 mg per ML betamethasone and 4 mL of 1% lidocaine was injected. A band aid was applied. The patient tolerated the procedure well. Post procedure instructions were given.

## 2022-10-04 NOTE — Patient Instructions (Signed)
Take baclofen during the day and tizanidine at bedtime

## 2022-10-04 NOTE — Progress Notes (Signed)
Subjective:    Patient ID: [name], male    DOB: 09-Oct-1965, 56 y.o.   MRN: [id number]  HPI Patient here for Botox follow-up he has had good results with the Dysport injection.  He states that his average duration relief is around 10 weeks.  He is about 6 weeks post.  He had no postinjection complications.  Good relaxation of the upper and lower limb.  His only complaint at this time in terms of spasticity and shaking of the left leg when he is in bed.  He states the entire leg shakes not just the foot and ankle.  He takes baclofen 20 mg during the day.  Dysport injection 08/23/2022 L OP 100 L FCR 100 L FDP 100  L Post Tib 100 L soleus 100 Pain Inventory Average Pain 9 Pain Right Now 7 My pain is constant, sharp, and aching  LOCATION OF PAIN  left arm, left shoulder, left leg  BOWEL Number of stools per week: 4 Oral laxative use Yes  Type of laxative Miralax Enema or suppository use Yes    BLADDER Normal    Mobility use a cane use a walker how many minutes can you walk? unknown ability to climb steps?  yes do you drive?  yes use a wheelchair needs help with transfers Do you have any goals in this area?  yes  Function disabled: date disabled 2003 I need assistance with the following:  dressing, bathing, toileting, meal prep, household duties, and shopping Do you have any goals in this area?  yes  Neuro/Psych weakness numbness tremor tingling trouble walking spasms dizziness confusion depression anxiety loss of taste or smell  Prior Studies Any changes since last visit?  no  Physicians involved in your care Any changes since last visit?  no   Family History  Problem Relation Age of Onset   Lung cancer Father    Heart disease Father    Heart disease Mother    Congestive Heart Failure Mother    Diabetes Mother    Hyperlipidemia Mother    Alcohol abuse Brother    Breast cancer Maternal Aunt    Suicidality Cousin    Social  History   Socioeconomic History   Marital status: Legally Separated    Spouse name: [name]   Number of children: 2   Years of education: 12   Highest education level: 12th grade  Occupational History   Occupation: disabled  Tobacco Use   Smoking status: Every Day    Packs/day: 1.00    Years: 30.00    Additional pack years: 0.00    Total pack years: 30.00    Types: Cigarettes    Start date: 06/08/1982    Passive exposure: Current   Smokeless tobacco: Never   Tobacco comments:    1 pack per day 03/04/2018  Vaping Use   Vaping Use: Never used  Substance and Sexual Activity   Alcohol use: Yes    Comment: occassionally   Drug use: No   Sexual activity: Yes    Birth control/protection: None  Other Topics Concern   Not on file  Social History Narrative   Disabled since age 37 lives with wife and children      Patient is right-handed. He is married, lives in a 1 story house, has a ramp to enter. Drinks 64oz of Mtn. Dew a day. Prior to CVA was walking daily. 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  Social Determinants of Health   Financial Resource Strain: Low Risk  (02/10/2022)   Overall Financial Resource Strain (CARDIA)    Difficulty of Paying Living Expenses: Not hard at all  Food Insecurity: No Food Insecurity (02/10/2022)   Hunger Vital Sign    Worried About Running Out of Food in the Last Year: Never true    Ran Out of Food in the Last Year: Never true  Transportation Needs: No Transportation Needs (02/10/2022)   PRAPARE - Hydrologist (Medical): No    Lack of Transportation (Non-Medical): No  Physical Activity: Inactive (02/10/2022)   Exercise Vital Sign    Days of Exercise per Week: 0 days    Minutes of Exercise per Session: 0 min  Stress: No Stress Concern Present (02/10/2022)   [place]    Feeling of Stress : Only a little  Social  Connections: Moderately Isolated (02/10/2022)   Social Connection and Isolation Panel [NHANES]    Frequency of Communication with Friends and Family: More than three times a week    Frequency of Social Gatherings with Friends and Family: More than three times a week    Attends Religious Services: 1 to 4 times per year    Active Member of [employer] or Organizations: No    Attends Archivist Meetings: Never    Marital Status: Separated   Past Surgical History:  Procedure Laterality Date   CHOLECYSTECTOMY     CORONARY ARTERY BYPASS GRAFT  2010   LIMA to LAD, SVG to diagonal, SVG to OM1 and OM 2, SVG to RCA   DENTAL SURGERY  2003   GASTRIC BYPASS  2010   HERNIA REPAIR  2011, 2012   [place] N/A 07/23/2013   Procedure: HERNIA REPAIR INCISIONAL WITH MESH;  Surgeon: Jamesetta So, MD;  Location: [facility];  Service: General;  Laterality: N/A;   INSERTION OF MESH N/A 07/23/2013   Procedure: INSERTION OF MESH;  Surgeon: Jamesetta So, MD;  Location: [facility];  Service: General;  Laterality: N/A;   LEFT HEART CATHETERIZATION WITH CORONARY/GRAFT ANGIOGRAM N/A 11/01/2013   Procedure: LEFT HEART CATHETERIZATION WITH Beatrix Fetters;  Surgeon: Blane Ohara, MD;  Location: [facility] CATH LAB;  Service: Cardiovascular;  Laterality: N/A;   LYMPH NODE DISSECTION Bilateral 04/04/2021   Procedure: PELVIC LYMPH NODE DISSECTION;  Surgeon: Alexis Frock, MD;  Location: [facility];  Service: Urology;  Laterality: Bilateral;   ROBOT ASSISTED LAPAROSCOPIC RADICAL PROSTATECTOMY N/A 04/04/2021   Procedure: XI ROBOTIC ASSISTED LAPAROSCOPIC RADICAL PROSTATECTOMY WITH INDOCYANINE GREEN DYE;  Surgeon: Alexis Frock, MD;  Location: [facility];  Service: Urology;  Laterality: N/A;  3 HRS   TOE AMPUTATION  1998   right 1st and 2nd toe   TONSILECTOMY, ADENOIDECTOMY, BILATERAL MYRINGOTOMY AND TUBES     TONSILLECTOMY     VENTRAL HERNIA REPAIR N/A 10/28/2012   Procedure: LAPAROSCOPIC VENTRAL HERNIA;  Surgeon:  Donato Heinz, MD;  Location: [facility];  Service: General;  Laterality: N/A;   Past Medical History:  Diagnosis Date   Anaphylaxis    IGE mediated   Anxiety    Arthritis    Cancer ([place])    Prostate   Cardiomyopathy ([place])    CHF (congestive heart failure) ([place])    COPD (chronic obstructive pulmonary disease) ([place])    Coronary atherosclerosis of native coronary artery    Multivessel status post CABG 2010   Depression  Essential hypertension    History of CVA (cerebrovascular accident) 01/2012   Right posterior frontal cortical and subcortical brain by MRI, no hemorrhage. Carotid Dopplers showed only 1-50% bilateral ICA stenoses. Echocardiogram showed LVEF 50%, no major valvular abnormalities.   History of kidney stones    Mixed hyperlipidemia    Myocardial infarction ([place]) 2010   OSA (obstructive sleep apnea)    Pneumonia    Prostate cancer ([place]) 12/2020   Dr[facility] Tresa [facility] at [facility]   Stroke [facility])    Type 2 diabetes mellitus ([place])    BP 98/63   Pulse 72   Temp 98 F (36.7 C)   Ht 5\' 11"  (1.803 m)   SpO2 97%   BMI 25.94 kg/m   Opioid Risk Score:   Fall Risk Score:  `1  Depression screen [facility] 2/9     08/23/2022   12:56 PM 07/12/2022   12:54 PM 05/21/2022   11:23 AM 04/04/2022   10:50 AM 02/15/2022    1:20 PM 02/10/2022    6:26 PM 12/27/2021    1:15 PM  Depression screen PHQ 2/9  Decreased Interest 1 0 1 0 2 0 1  Down, Depressed, Hopeless 1 0 1 0 2 1 1   PHQ - 2 Score 2 0 2 0 4 1 2     Review of Systems  Musculoskeletal:  Positive for gait problem.       Spasms  Neurological:  Positive for dizziness, tremors, weakness and numbness.  Psychiatric/Behavioral:  Positive for confusion.        Depression, Anxiety  All other systems reviewed and are negative.      Objective:   Physical Exam Tone Left lower extremity no evidence of clonus at the ankle no foot inversion Upper extremity tone left MAS 1 at the elbow flexor and finger flexors 1 at the thumb  flexor Strength is 3 - at the left deltoid trace finger flexors and extensors trace elbow flexors and extensors Left lower extremity has 3 - hip flexor and knee extensor trace ankle dorsiflexor  Left shoulder with positive impingement sign he has tightness with external rotation but no pain      Assessment & Plan:  #1.  Left spastic hemiplegia improvements in tone after Dysport injection performed approximately 6 weeks ago in the muscle groups listed above.  No change in injection dosage or muscle group selection. 2.  Left shoulder pain impingement syndrome repeat subacromial injection today

## 2022-10-07 DIAGNOSIS — I10 Essential (primary) hypertension: Secondary | ICD-10-CM | POA: Diagnosis not present

## 2022-10-07 DIAGNOSIS — F17210 Nicotine dependence, cigarettes, uncomplicated: Secondary | ICD-10-CM | POA: Diagnosis not present

## 2022-10-07 DIAGNOSIS — E7849 Other hyperlipidemia: Secondary | ICD-10-CM | POA: Diagnosis not present

## 2022-10-07 DIAGNOSIS — K2100 Gastro-esophageal reflux disease with esophagitis, without bleeding: Secondary | ICD-10-CM | POA: Diagnosis not present

## 2022-10-07 DIAGNOSIS — E1165 Type 2 diabetes mellitus with hyperglycemia: Secondary | ICD-10-CM | POA: Diagnosis not present

## 2022-10-07 DIAGNOSIS — N184 Chronic kidney disease, stage 4 (severe): Secondary | ICD-10-CM | POA: Diagnosis not present

## 2022-10-08 DIAGNOSIS — E782 Mixed hyperlipidemia: Secondary | ICD-10-CM | POA: Diagnosis not present

## 2022-10-08 DIAGNOSIS — F17210 Nicotine dependence, cigarettes, uncomplicated: Secondary | ICD-10-CM | POA: Diagnosis not present

## 2022-10-08 DIAGNOSIS — K2100 Gastro-esophageal reflux disease with esophagitis, without bleeding: Secondary | ICD-10-CM | POA: Diagnosis not present

## 2022-10-08 DIAGNOSIS — N184 Chronic kidney disease, stage 4 (severe): Secondary | ICD-10-CM | POA: Diagnosis not present

## 2022-10-08 DIAGNOSIS — E1165 Type 2 diabetes mellitus with hyperglycemia: Secondary | ICD-10-CM | POA: Diagnosis not present

## 2022-10-08 DIAGNOSIS — I10 Essential (primary) hypertension: Secondary | ICD-10-CM | POA: Diagnosis not present

## 2022-10-16 NOTE — Progress Notes (Signed)
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  Will also seek prior authorization for occipital nerve block 2.  Secondary stroke prevention as managed by PCP:  - ASA and Plavix   - Statin.  LDL goal less than 70  - Hgb A1c goal less than 7  - Normotensive blood pressure 3.  Follow up 6 months    Subjective:  [name] is a 57 year old right-handed man with hypertension, type 2 diabetes mellitus, CAD, ischemic cardiomyopathy, COPD, tobacco use disorder, depression, prostate cancer and history of prior strokes who follows up for migraines.  History supplemented by his accompanying niece.     UPDATE: Current medications:  ASA 81mg , Plavix 75mg , atorvastatin 80mg , Coreg, Imdur,  alprazolam 1mg  PRN, oxycodone, naloxone, baclofen, Cymbalta 60mg  daily, Lyrica 75mg  BID, baclofen  Continues to have right sided occipital neuralgia.  Was on Lyrica.  He is off now.  Does not know why it was discontinued.  Headaches are now daily again.    HISTORY: He was admitted to [name] on [facility]17 with sudden onset left sided weakness and numbness and difficulty speaking.  He initially presented to [facility] where he received tPA with NIHSS of 15 and was then transferred to [name].    CT of head from [name] reportedly showed "acute infarction involving portions of the posterior right basal ganglia, right corona radiata and body of the right caudate nucleus".  MRI of brain at [name] confirmed acute right basal ganglia infarct as well as remote right frontal infarct.  MRA  of head and carotid doppler revealed no significant intracranial or extracranial arterial stenosis or occlusion.  He was unable to have CTA due to allergy to iodinated contrast.    Echocardiogram with bubble study showed EF of 25-30% with no evidence of PFO or ASD.  He was evaluated by cardiology for ischemic cardiomyopathy.  Lexiscan nuclear perfusion study revealed EF 18% with large infarct involving the apex and periapical anterior wall with no evidence of ischemia.  He was advised to follow up with outpatient cardiology.  LDL was 123 and triglycerides were 237.  Hgb A1c was 5.7.  He was already taking ASA 325mg  but not daily, as well as Plavix.  He is continued on ASA and Plavix.  06/30/2019 Echocardiogram:  LV EF 40-50%   He was seen in the ED at [name] on 09/22/17 for headache.  CT of head showed no acute findings but was read as demonstrating a chronic appearing infarct in the right posterior limb of internal capsule that was not present on prior CT from 06/14/17.  He also complained of left leg pain.  He reportedly had an elevated d dimer.  CT chest and venous doppler of lower extremity were negative for DVT.  However, the started him on Xarelto.  He was told to stop Plavix by his PCP.  I contacted his PCP, Dr. Reuel Boom, who reviewed the ED notes and is also unsure why they started Xarelto when imaging was negative for DVT.  He was advised to discontinue Xarelto and restart Plavix with aspirin.   Headaches returned in December 2020.  Right sided 10/10 throbbing/pressure pain in the temple radiating to back of  head.  Associated nausea, blurred vision, photophobia.  They last 2 hours and occur 2 to 3 days a week.  He reports right occipital/suboccipital numbness and tingling off and on.  He also notes that his right hand twitches about every other day, lasting 20-30 minutes.  It may occur while holding a cup but also at rest.  Due to worsening headache, MRI of brain without contrast  was performed on 09/13/2019, which was personally reviewed, and demonstrated stable advanced chronic small vessel ischemic changes but no acute intracranial abnormality. Due to right hand myoclonus, EEG was performed on 08/17/2019, which was normal.  MRI of cervical spine on 08/08/2020 showed mild cervical spondylosis with mild spinal stenosis and mild to moderate neural foraminal stenosis at C3-4 and C4-5.  In April 2023, he was hospitalized at [name] for altered mental status and acute respiratory failure.  Unsure if he was having a stroke or possibly status epilepticus.  He had significant hypotension.  Unable to get an MRI due to body habitus.  Serial head CTs revealed remote right hemispheric encephalomalacia but no acute findings.  CTA head and neck revealed atherosclerosis in both carotid bifurcations and flow lmiting stenosis of the left ICA at the distal bulb but on LVO or significant intracranial stenosis.  Long term video EEG monitoring revealed generalized slowing but no epileptiform discharges or electrographic seizures.    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  He did go to PT and speech therapy.     He sees Dr. Wynn Banker for Dysport injection to treat spasticity.  He reports panic attacks.  He sees Dr. Evelene Croon for depression and anxiety.   Past medications:  gabapentin 600mg  BID    PAST MEDICAL HISTORY: Past Medical History:  Diagnosis Date   Anaphylaxis    IGE mediated   Anxiety    Arthritis    Cancer ([facility])    Prostate   Cardiomyopathy ([facility])    CHF (congestive heart failure) ([facility])    COPD (chronic obstructive pulmonary disease) ([facility])    Coronary atherosclerosis of native coronary artery    Multivessel status post CABG 2010   Depression    Essential hypertension    History of CVA (cerebrovascular accident) 01/2012   Right  posterior frontal cortical and subcortical brain by MRI, no hemorrhage. Carotid Dopplers showed only 1-50% bilateral ICA stenoses. Echocardiogram showed LVEF 50%, no major valvular abnormalities.   History of kidney stones    Mixed hyperlipidemia    Myocardial infarction ([facility]) 2010   OSA (obstructive sleep apnea)    Pneumonia    Prostate cancer ([facility]) 12/2020   Dr. Berneice Heinrich at [facility]   Stroke [facility])    Type 2 diabetes mellitus [facility])     MEDICATIONS: Current Outpatient Medications on File Prior to Visit  Medication Sig Dispense Refill   acetaminophen (TYLENOL) 500 MG tablet Take 1,000 mg by mouth every 6 (six) hours as needed for moderate pain or headache.     albuterol (PROVENTIL HFA;VENTOLIN HFA) 108 (90 BASE) MCG/ACT inhaler Inhale 2 puffs into the lungs every 6 (six) hours as needed for wheezing or shortness of breath.     ALPRAZolam (XANAX) 1 MG tablet Take 1 mg by mouth 2 (two) times daily as needed.     aspirin EC 81 MG tablet Take 1 tablet (81 mg total) by mouth daily. 90 tablet 3   atorvastatin (LIPITOR) 40 MG tablet TAKE 1  TABLET ONCE DAILY. 14 tablet 0   baclofen (LIORESAL) 20 MG tablet Take by mouth.     bisacodyl (DULCOLAX) 5 MG EC tablet Take by mouth.     carvedilol (COREG) 3.125 MG tablet Take 3.125 mg by mouth 2 (two) times daily.     Cholecalciferol (VITAMIN D) 50 MCG (2000 UT) CAPS 1 capsule     clopidogrel (PLAVIX) 75 MG tablet [id number]  Plavix 75 mg tablet 75 milligrams PO once a day for ? CVA; 1 tab po daily     dapsone 25 MG tablet Take by mouth.     dibucaine (NUPERCAINAL) 1 % OINT 1 application as needed     diltiazem (CARDIZEM CD) 120 MG 24 hr capsule Take by mouth.     diltiazem (CARDIZEM CD) 240 MG 24 hr capsule Take 240 mg by mouth daily.      docusate sodium (COLACE) 100 MG capsule Take 1 capsule (100 mg total) by mouth 2 (two) times daily. (Patient not taking: Reported on 02/15/2022)     DULoxetine (CYMBALTA) 60 MG capsule Take 1 capsule (60 mg total) by  mouth daily. 90 capsule 0   ENTRESTO 49-51 MG TAKE (1) TABLET TWICE DAILY. 30 tablet 0   fenofibrate micronized (LOFIBRA) 134 MG capsule Take 134 mg by mouth daily before breakfast.     fluconazole (DIFLUCAN) 200 MG tablet Take by mouth.     fluticasone (CUTIVATE) 0.05 % cream Apply 1 application topically 2 (two) times daily.     fluticasone (FLONASE) 50 MCG/ACT nasal spray [id number]  Flonase Allergy Relief 50 mcg/actuation spray 1 spray(s) INH as directed for Allergy; 1 spray each nostril daily     furosemide (LASIX) 20 MG tablet Take 20 mg by mouth daily.      hydrocortisone (ANUSOL-HC) 25 MG suppository 1 suppository     isosorbide mononitrate (IMDUR) 30 MG 24 hr tablet 1 tablet in the morning     isosorbide mononitrate (IMDUR) 60 MG 24 hr tablet Take 60 mg by mouth 2 (two) times daily.      meclizine (ANTIVERT) 25 MG tablet Take by mouth.     midodrine (PROAMATINE) 10 MG tablet [id number]  midodrine 10 mg tablet 10 milligrams PO three times a day for Hypotension; 1 tab po TID     naloxone (NARCAN) nasal spray 4 mg/0.1 mL Place 1 spray into the nose as needed (opioid overdose).     nicotine (NICODERM CQ - DOSED IN MG/24 HOURS) 21 mg/24hr patch 21 mg daily.     nicotine polacrilex (NICORETTE) 4 MG gum Place inside cheek.     nitroGLYCERIN (NITROSTAT) 0.4 MG SL tablet 1 tablet as directed     nystatin (MYCOSTATIN) 100000 UNIT/ML suspension Take by mouth.     ondansetron (ZOFRAN) 8 MG tablet Take 8 mg by mouth 3 (three) times daily.     ondansetron (ZOFRAN-ODT) 8 MG disintegrating tablet Take 8 mg by mouth every 6 (six) hours as needed.     oxyCODONE-acetaminophen (PERCOCET) 7.5-325 MG tablet Take 1 tablet by mouth every 6 (six) hours as needed for moderate pain. 10 tablet 0   pantoprazole (PROTONIX) 40 MG tablet Take by mouth.     polyethylene glycol powder (GLYCOLAX/MIRALAX) 17 GM/SCOOP powder Take 17 g by mouth daily.     pregabalin (LYRICA) 75 MG capsule TAKE 1 CAPSULE BY MOUTH TWICE DAILY.  60 capsule 3   Sennosides-Docusate Sodium (SENOKOT S PO) [id number]  Senokot-S 8.6-50 mg tablet 4 tab(s) PO three times a  day as needed for Constipation; 4 tabs po TID PRN     silver sulfADIAZINE (SILVADENE) 1 % cream Apply 1 application topically 3 (three) times daily.     tamsulosin (FLOMAX) 0.4 MG CAPS capsule Take 0.4 mg by mouth.     tiZANidine (ZANAFLEX) 2 MG tablet Take by mouth every 6 (six) hours as needed for muscle spasms.     traZODone (DESYREL) 50 MG tablet Take 50 mg by mouth at bedtime.     triamcinolone cream (KENALOG) 0.1 % Apply 1 application topically 2 (two) times daily.     varenicline (CHANTIX) 1 MG tablet Take by mouth.     No current facility-administered medications on file prior to visit.    ALLERGIES: Allergies  Allergen Reactions   Ibuprofen Anaphylaxis, Hives and Swelling   Iodinated Contrast Media Anaphylaxis, Shortness Of Breath, Swelling, Rash and Itching    Isovue contrast is most acceptable agent based on previous experience and testing with premedications Other reaction(s): throat swells up Other reaction(s): throat swells up Other reaction(s): Other   Nsaids Anaphylaxis, Hives, Swelling and Rash    Can take Aspirin 325 mg or lower Other reaction(s): rash Other reaction(s): rash   Ace Inhibitors     Other reaction(s): Unknown Other reaction(s): Unknown   Chantix [Varenicline]     Nightmares    Other Other (See Comments)    Anti-physcotics Personality change   Pollen Extract     Other reaction(s): Unknown Other reaction(s): Unknown    FAMILY HISTORY: Family History  Problem Relation Age of Onset   Lung cancer Father    Heart disease Father    Heart disease Mother    Congestive Heart Failure Mother    Diabetes Mother    Hyperlipidemia Mother    Alcohol abuse Brother    Breast cancer Maternal Aunt    Suicidality Cousin       Objective:  Blood pressure (!) 97/52, pulse 87, height 5\' 11"  (1.803 m), weight 192 lb (87.1 kg), SpO2 94  %. General: No acute distress.  Patient appears well-groomed.   Head:  Normocephalic/atraumatic Eyes:  Fundi examined but not visualized Neck: supple, no paraspinal tenderness, full range of motion Heart:  Regular rate and rhythm Neurological Exam: alert and oriented.  Speech fluent and not dysarthric, language intact.  Decreased left V2.  Otherwise, CN II-XII intact. Tone flaccid in left upper extremity.  Muscle strength plegic in left upper extremity except 2+/5 hand grip, 2+/5 left hip flexion, plegic left knee flexion/extension, left foot drop, 5/5 right upper and lower extremities.  DTR 3+ left upper and lower extremities, 2+ right upper and lower extremities.  Finger to nose intact on right, unable to assess left; Hemiplegic gait   Shon Millet, DO  CC: Donzetta Sprung, MD

## 2022-10-17 ENCOUNTER — Ambulatory Visit (INDEPENDENT_AMBULATORY_CARE_PROVIDER_SITE_OTHER): Payer: Medicare HMO | Admitting: Neurology

## 2022-10-17 VITALS — BP 97/52 | HR 87 | Ht 71.0 in | Wt 192.0 lb

## 2022-10-17 DIAGNOSIS — Z794 Long term (current) use of insulin: Secondary | ICD-10-CM

## 2022-10-17 DIAGNOSIS — I959 Hypotension, unspecified: Secondary | ICD-10-CM

## 2022-10-17 DIAGNOSIS — E118 Type 2 diabetes mellitus with unspecified complications: Secondary | ICD-10-CM | POA: Diagnosis not present

## 2022-10-17 DIAGNOSIS — I10 Essential (primary) hypertension: Secondary | ICD-10-CM | POA: Diagnosis not present

## 2022-10-17 DIAGNOSIS — M5481 Occipital neuralgia: Secondary | ICD-10-CM | POA: Diagnosis not present

## 2022-10-17 DIAGNOSIS — I69354 Hemiplegia and hemiparesis following cerebral infarction affecting left non-dominant side: Secondary | ICD-10-CM

## 2022-10-17 NOTE — Patient Instructions (Signed)
When we verify Lyrica, will likely increase dose (will let you know) In meantime, will check with [facility] Medicare to see if we need prior authorization for occipital nerve block and will get you scheduled. Continue Plavix, aspirin, atorvastatin and blood pressure medications as per you other providers Follow up 6 months (sooner for injection)

## 2022-10-18 ENCOUNTER — Encounter: Payer: Self-pay | Admitting: Neurology

## 2022-10-18 ENCOUNTER — Telehealth: Payer: Self-pay

## 2022-10-18 DIAGNOSIS — E7849 Other hyperlipidemia: Secondary | ICD-10-CM | POA: Diagnosis not present

## 2022-10-18 DIAGNOSIS — G8102 Flaccid hemiplegia affecting left dominant side: Secondary | ICD-10-CM | POA: Diagnosis not present

## 2022-10-18 DIAGNOSIS — E440 Moderate protein-calorie malnutrition: Secondary | ICD-10-CM | POA: Diagnosis not present

## 2022-10-18 DIAGNOSIS — I10 Essential (primary) hypertension: Secondary | ICD-10-CM | POA: Diagnosis not present

## 2022-10-18 DIAGNOSIS — I25119 Atherosclerotic heart disease of native coronary artery with unspecified angina pectoris: Secondary | ICD-10-CM | POA: Diagnosis not present

## 2022-10-18 DIAGNOSIS — N1831 Chronic kidney disease, stage 3a: Secondary | ICD-10-CM | POA: Diagnosis not present

## 2022-10-18 DIAGNOSIS — J449 Chronic obstructive pulmonary disease, unspecified: Secondary | ICD-10-CM | POA: Diagnosis not present

## 2022-10-18 DIAGNOSIS — F17210 Nicotine dependence, cigarettes, uncomplicated: Secondary | ICD-10-CM | POA: Diagnosis not present

## 2022-10-18 DIAGNOSIS — E8881 Metabolic syndrome: Secondary | ICD-10-CM | POA: Diagnosis not present

## 2022-10-18 MED ORDER — PREGABALIN 100 MG PO CAPS: 100.0000 mg | ORAL_CAPSULE | Freq: Two times a day (BID) | ORAL | 5 refills | Status: DC

## 2022-10-18 NOTE — Telephone Encounter (Signed)
NO PA needed for [id number], [id number]

## 2022-10-23 DIAGNOSIS — F39 Unspecified mood [affective] disorder: Secondary | ICD-10-CM | POA: Diagnosis not present

## 2022-10-23 DIAGNOSIS — E1165 Type 2 diabetes mellitus with hyperglycemia: Secondary | ICD-10-CM | POA: Diagnosis not present

## 2022-10-23 DIAGNOSIS — F1120 Opioid dependence, uncomplicated: Secondary | ICD-10-CM | POA: Diagnosis not present

## 2022-10-23 DIAGNOSIS — G894 Chronic pain syndrome: Secondary | ICD-10-CM | POA: Diagnosis not present

## 2022-10-23 DIAGNOSIS — Z79899 Other long term (current) drug therapy: Secondary | ICD-10-CM | POA: Diagnosis not present

## 2022-10-23 DIAGNOSIS — R030 Elevated blood-pressure reading, without diagnosis of hypertension: Secondary | ICD-10-CM | POA: Diagnosis not present

## 2022-10-23 DIAGNOSIS — Z6826 Body mass index (BMI) 26.0-26.9, adult: Secondary | ICD-10-CM | POA: Diagnosis not present

## 2022-10-23 DIAGNOSIS — F17210 Nicotine dependence, cigarettes, uncomplicated: Secondary | ICD-10-CM | POA: Diagnosis not present

## 2022-10-23 DIAGNOSIS — M545 Low back pain, unspecified: Secondary | ICD-10-CM | POA: Diagnosis not present

## 2022-10-23 DIAGNOSIS — M79662 Pain in left lower leg: Secondary | ICD-10-CM | POA: Diagnosis not present

## 2022-10-29 DIAGNOSIS — Z79899 Other long term (current) drug therapy: Secondary | ICD-10-CM | POA: Diagnosis not present

## 2022-11-04 DIAGNOSIS — R2689 Other abnormalities of gait and mobility: Secondary | ICD-10-CM | POA: Diagnosis not present

## 2022-11-06 DIAGNOSIS — R2689 Other abnormalities of gait and mobility: Secondary | ICD-10-CM | POA: Diagnosis not present

## 2022-11-07 ENCOUNTER — Other Ambulatory Visit: Payer: Self-pay | Admitting: Cardiology

## 2022-11-11 DIAGNOSIS — R2689 Other abnormalities of gait and mobility: Secondary | ICD-10-CM | POA: Diagnosis not present

## 2022-11-12 DIAGNOSIS — I10 Essential (primary) hypertension: Secondary | ICD-10-CM | POA: Diagnosis not present

## 2022-11-12 DIAGNOSIS — E782 Mixed hyperlipidemia: Secondary | ICD-10-CM | POA: Diagnosis not present

## 2022-11-12 DIAGNOSIS — J449 Chronic obstructive pulmonary disease, unspecified: Secondary | ICD-10-CM | POA: Diagnosis not present

## 2022-11-12 DIAGNOSIS — E1165 Type 2 diabetes mellitus with hyperglycemia: Secondary | ICD-10-CM | POA: Diagnosis not present

## 2022-11-15 DIAGNOSIS — R2689 Other abnormalities of gait and mobility: Secondary | ICD-10-CM | POA: Diagnosis not present

## 2022-11-18 DIAGNOSIS — R2689 Other abnormalities of gait and mobility: Secondary | ICD-10-CM | POA: Diagnosis not present

## 2022-11-20 DIAGNOSIS — R2689 Other abnormalities of gait and mobility: Secondary | ICD-10-CM | POA: Diagnosis not present

## 2022-11-21 DIAGNOSIS — M545 Low back pain, unspecified: Secondary | ICD-10-CM | POA: Diagnosis not present

## 2022-11-21 DIAGNOSIS — F1120 Opioid dependence, uncomplicated: Secondary | ICD-10-CM | POA: Diagnosis not present

## 2022-11-21 DIAGNOSIS — F39 Unspecified mood [affective] disorder: Secondary | ICD-10-CM | POA: Diagnosis not present

## 2022-11-21 DIAGNOSIS — E1165 Type 2 diabetes mellitus with hyperglycemia: Secondary | ICD-10-CM | POA: Diagnosis not present

## 2022-11-21 DIAGNOSIS — M79662 Pain in left lower leg: Secondary | ICD-10-CM | POA: Diagnosis not present

## 2022-11-21 DIAGNOSIS — Z6826 Body mass index (BMI) 26.0-26.9, adult: Secondary | ICD-10-CM | POA: Diagnosis not present

## 2022-11-21 DIAGNOSIS — G894 Chronic pain syndrome: Secondary | ICD-10-CM | POA: Diagnosis not present

## 2022-11-21 DIAGNOSIS — I639 Cerebral infarction, unspecified: Secondary | ICD-10-CM | POA: Diagnosis not present

## 2022-11-21 DIAGNOSIS — Z79899 Other long term (current) drug therapy: Secondary | ICD-10-CM | POA: Diagnosis not present

## 2022-11-22 ENCOUNTER — Encounter: Payer: Self-pay | Admitting: Physical Medicine & Rehabilitation

## 2022-11-22 ENCOUNTER — Encounter: Payer: Medicare HMO | Attending: Physical Medicine & Rehabilitation | Admitting: Physical Medicine & Rehabilitation

## 2022-11-22 ENCOUNTER — Ambulatory Visit (INDEPENDENT_AMBULATORY_CARE_PROVIDER_SITE_OTHER): Payer: Medicare HMO | Admitting: Neurology

## 2022-11-22 VITALS — BP 92/57 | HR 67 | Temp 97.7°F | Ht 71.0 in | Wt 193.0 lb

## 2022-11-22 DIAGNOSIS — M5481 Occipital neuralgia: Secondary | ICD-10-CM | POA: Diagnosis not present

## 2022-11-22 DIAGNOSIS — G8110 Spastic hemiplegia affecting unspecified side: Secondary | ICD-10-CM | POA: Diagnosis not present

## 2022-11-22 MED ORDER — ABOBOTULINUMTOXINA 500 UNITS IM SOLR
500.0000 [IU] | Freq: Once | INTRAMUSCULAR | Status: AC
Start: 2022-11-22 — End: 2022-11-22
  Administered 2022-11-22: 500 [IU] via INTRAMUSCULAR

## 2022-11-22 MED ORDER — BUPIVACAINE HCL 0.25 % IJ SOLN
3.0000 mL | Freq: Once | INTRAMUSCULAR | Status: AC
  Administered 2022-11-22: 3 mL via INTRA_ARTICULAR

## 2022-11-22 MED ORDER — TRIAMCINOLONE ACETONIDE 40 MG/ML IJ SUSP
40.0000 mg | Freq: Once | INTRAMUSCULAR | Status: AC
  Administered 2022-11-22: 40 mg via INTRA_ARTICULAR

## 2022-11-22 NOTE — Patient Instructions (Signed)

## 2022-11-22 NOTE — Progress Notes (Unsigned)
Procedure: Occipital nerve block  Procedure risks, benefits and alternative treatments explained to patient and they agree to proceed.   A concentration of 0.25% bupivacaine (3 ml) was mixed with 40 mg of Kenalog (1 ml). A 27 gauge needle was used for injection. The region of the right greater occipital nerve was located by palpation. The area was prepped with alcohol. A total of 4 cc of the above mixture was injected without difficulty. The patient tolerated the procedure well.    Heli Dino R. Everlena Cooper, DO

## 2022-11-22 NOTE — Progress Notes (Signed)
Dysport Injection for spasticity using needle EMG guidance  Dilution: 200 Units/ml Indication: Severe spasticity which interferes with ADL,mobility and/or  hygiene and is unresponsive to medication management and other conservative care Informed consent was obtained after describing risks and benefits of the procedure with the patient. This includes bleeding, bruising, infection, excessive weakness, or medication side effects. A REMS form is on file and signed. Needle:  needle electrode Number of units per muscle L OP 100 L FCR 100 L FDP 100  L Post Tib 100 L soleus 100 All injections were done after obtaining appropriate EMG activity and after negative drawback for blood. The patient tolerated the procedure well. Post procedure instructions were given. A followup appointment was made.   

## 2022-11-25 DIAGNOSIS — R2689 Other abnormalities of gait and mobility: Secondary | ICD-10-CM | POA: Diagnosis not present

## 2022-11-26 DIAGNOSIS — Z5181 Encounter for therapeutic drug level monitoring: Secondary | ICD-10-CM | POA: Diagnosis not present

## 2022-11-26 DIAGNOSIS — L732 Hidradenitis suppurativa: Secondary | ICD-10-CM | POA: Diagnosis not present

## 2022-11-27 DIAGNOSIS — R2689 Other abnormalities of gait and mobility: Secondary | ICD-10-CM | POA: Diagnosis not present

## 2022-12-02 DIAGNOSIS — R2689 Other abnormalities of gait and mobility: Secondary | ICD-10-CM | POA: Diagnosis not present

## 2022-12-11 DIAGNOSIS — R2689 Other abnormalities of gait and mobility: Secondary | ICD-10-CM | POA: Diagnosis not present

## 2022-12-13 DIAGNOSIS — I10 Essential (primary) hypertension: Secondary | ICD-10-CM | POA: Diagnosis not present

## 2022-12-13 DIAGNOSIS — E782 Mixed hyperlipidemia: Secondary | ICD-10-CM | POA: Diagnosis not present

## 2022-12-13 DIAGNOSIS — E1165 Type 2 diabetes mellitus with hyperglycemia: Secondary | ICD-10-CM | POA: Diagnosis not present

## 2022-12-13 DIAGNOSIS — J449 Chronic obstructive pulmonary disease, unspecified: Secondary | ICD-10-CM | POA: Diagnosis not present

## 2022-12-23 DIAGNOSIS — G894 Chronic pain syndrome: Secondary | ICD-10-CM | POA: Diagnosis not present

## 2022-12-23 DIAGNOSIS — Z6826 Body mass index (BMI) 26.0-26.9, adult: Secondary | ICD-10-CM | POA: Diagnosis not present

## 2022-12-23 DIAGNOSIS — F1120 Opioid dependence, uncomplicated: Secondary | ICD-10-CM | POA: Diagnosis not present

## 2022-12-23 DIAGNOSIS — I639 Cerebral infarction, unspecified: Secondary | ICD-10-CM | POA: Diagnosis not present

## 2022-12-23 DIAGNOSIS — M545 Low back pain, unspecified: Secondary | ICD-10-CM | POA: Diagnosis not present

## 2022-12-23 DIAGNOSIS — M79662 Pain in left lower leg: Secondary | ICD-10-CM | POA: Diagnosis not present

## 2022-12-23 DIAGNOSIS — F39 Unspecified mood [affective] disorder: Secondary | ICD-10-CM | POA: Diagnosis not present

## 2022-12-23 DIAGNOSIS — Z79899 Other long term (current) drug therapy: Secondary | ICD-10-CM | POA: Diagnosis not present

## 2022-12-23 DIAGNOSIS — E1165 Type 2 diabetes mellitus with hyperglycemia: Secondary | ICD-10-CM | POA: Diagnosis not present

## 2022-12-26 DIAGNOSIS — R519 Headache, unspecified: Secondary | ICD-10-CM | POA: Diagnosis not present

## 2022-12-26 DIAGNOSIS — J449 Chronic obstructive pulmonary disease, unspecified: Secondary | ICD-10-CM | POA: Diagnosis not present

## 2022-12-26 DIAGNOSIS — I509 Heart failure, unspecified: Secondary | ICD-10-CM | POA: Diagnosis not present

## 2022-12-26 DIAGNOSIS — I2510 Atherosclerotic heart disease of native coronary artery without angina pectoris: Secondary | ICD-10-CM | POA: Diagnosis not present

## 2022-12-26 DIAGNOSIS — I110 Hypertensive heart disease with heart failure: Secondary | ICD-10-CM | POA: Diagnosis not present

## 2022-12-26 DIAGNOSIS — Z7982 Long term (current) use of aspirin: Secondary | ICD-10-CM | POA: Diagnosis not present

## 2022-12-26 DIAGNOSIS — F17210 Nicotine dependence, cigarettes, uncomplicated: Secondary | ICD-10-CM | POA: Diagnosis not present

## 2022-12-26 DIAGNOSIS — R4182 Altered mental status, unspecified: Secondary | ICD-10-CM | POA: Diagnosis not present

## 2022-12-26 DIAGNOSIS — R0602 Shortness of breath: Secondary | ICD-10-CM | POA: Diagnosis not present

## 2022-12-26 DIAGNOSIS — Z79899 Other long term (current) drug therapy: Secondary | ICD-10-CM | POA: Diagnosis not present

## 2022-12-26 DIAGNOSIS — R29810 Facial weakness: Secondary | ICD-10-CM | POA: Diagnosis not present

## 2022-12-26 DIAGNOSIS — R42 Dizziness and giddiness: Secondary | ICD-10-CM | POA: Diagnosis not present

## 2022-12-26 DIAGNOSIS — R079 Chest pain, unspecified: Secondary | ICD-10-CM | POA: Diagnosis not present

## 2022-12-26 DIAGNOSIS — E119 Type 2 diabetes mellitus without complications: Secondary | ICD-10-CM | POA: Diagnosis not present

## 2022-12-27 ENCOUNTER — Encounter: Payer: Medicare HMO | Attending: Physical Medicine & Rehabilitation | Admitting: Physical Medicine & Rehabilitation

## 2022-12-27 ENCOUNTER — Encounter: Payer: Self-pay | Admitting: Physical Medicine & Rehabilitation

## 2022-12-27 VITALS — BP 94/56 | HR 77 | Ht 71.0 in | Wt 174.0 lb

## 2022-12-27 DIAGNOSIS — Z79899 Other long term (current) drug therapy: Secondary | ICD-10-CM | POA: Diagnosis not present

## 2022-12-27 DIAGNOSIS — G8114 Spastic hemiplegia affecting left nondominant side: Secondary | ICD-10-CM | POA: Diagnosis not present

## 2022-12-27 DIAGNOSIS — S43002S Unspecified subluxation of left shoulder joint, sequela: Secondary | ICD-10-CM | POA: Insufficient documentation

## 2022-12-27 DIAGNOSIS — G8110 Spastic hemiplegia affecting unspecified side: Secondary | ICD-10-CM | POA: Insufficient documentation

## 2022-12-27 MED ORDER — BETAMETHASONE SOD PHOS & ACET 6 (3-3) MG/ML IJ SUSP
6.0000 mg | Freq: Once | INTRAMUSCULAR | Status: AC
Start: 2022-12-27 — End: 2022-12-27
  Administered 2022-12-27: 6 mg via INTRAMUSCULAR

## 2022-12-27 MED ORDER — LIDOCAINE HCL 1 % IJ SOLN
4.0000 mL | Freq: Once | INTRAMUSCULAR | Status: AC
Start: 2022-12-27 — End: 2022-12-27
  Administered 2022-12-27: 4 mL

## 2022-12-27 NOTE — Patient Instructions (Addendum)
Shoulder injection today with cortisone may take 2-3 days to take full effect  Plan to increase dysport dose next injection in ~6wks

## 2022-12-27 NOTE — Progress Notes (Signed)
Subjective:    Patient ID: [name], male    DOB: [birth year], 57 y.o.   MRN: [id number]  HPI  Pain Inventory Average Pain 4 Pain Right Now 4 My pain is constant and aching  In the last 24 hours, has pain interfered with the following? General activity 10 Relation with others 8 Enjoyment of life 6 What TIME of day is your pain at its worst? morning , daytime, evening, night, and varies Sleep (in general) Poor  Pain is worse with: walking, bending, sitting, inactivity, standing, and some activites Pain improves with:  medication Relief from Meds: 4  Family History  Problem Relation Age of Onset   Lung cancer Father    Heart disease Father    Heart disease Mother    Congestive Heart Failure Mother    Diabetes Mother    Hyperlipidemia Mother    Alcohol abuse Brother    Breast cancer Maternal Aunt    Suicidality Cousin    Social History   Socioeconomic History   Marital status: Legally Separated    Spouse name: Leslee Lehr   Number of children: 2   Years of education: 12   Highest education level: 12th grade  Occupational History   Occupation: disabled  Tobacco Use   Smoking status: Every Day    Packs/day: 1.00    Years: 30.00    Additional pack years: 0.00    Total pack years: 30.00    Types: Cigarettes    Start date: 06/08/1982    Passive exposure: Current   Smokeless tobacco: Never   Tobacco comments:    1 pack per day 03/04/2018  Vaping Use   Vaping Use: Never used  Substance and Sexual Activity   Alcohol use: Yes    Comment: occassionally   Drug use: No   Sexual activity: Yes    Birth control/protection: None  Other Topics Concern   Not on file  Social History Narrative   Disabled since age 42 lives with wife and children      Patient is right-handed. He is married, lives in a 1 story house, has a ramp to enter. Drinks 64oz of Mtn. Dew a day. Prior to CVA was walking daily. Prior to becoming disabled, he worked in a [employer].       Social Determinants of Health   Financial Resource Strain: Low Risk  (02/10/2022)   Overall Financial Resource Strain (CARDIA)    Difficulty of Paying Living Expenses: Not hard at all  Food Insecurity: No Food Insecurity (02/10/2022)   Hunger Vital Sign    Worried About Running Out of Food in the Last Year: Never true    Ran Out of Food in the Last Year: Never true  Transportation Needs: No Transportation Needs (02/10/2022)   PRAPARE - Administrator, Civil Service (Medical): No    Lack of Transportation (Non-Medical): No  Physical Activity: Inactive (02/10/2022)   Exercise Vital Sign    Days of Exercise per Week: 0 days    Minutes of Exercise per Session: 0 min  Stress: No Stress Concern Present (02/10/2022)   [employer] of Occupational Health - Occupational Stress Questionnaire    Feeling of Stress : Only a little  Social Connections: Moderately Isolated (02/10/2022)   Social Connection and Isolation Panel [NHANES]    Frequency of Communication with Friends and Family: More than three times a week    Frequency of Social Gatherings with Friends and Family: More than three  times a week    Attends Religious Services: 1 to 4 times per year    Active Member of Clubs or Organizations: No    Attends Banker Meetings: Never    Marital Status: Separated   Past Surgical History:  Procedure Laterality Date   CHOLECYSTECTOMY     CORONARY ARTERY BYPASS GRAFT  2010   LIMA to LAD, SVG to diagonal, SVG to OM1 and OM 2, SVG to RCA   DENTAL SURGERY  2003   GASTRIC BYPASS  2010   HERNIA REPAIR  2011, 2012   INCISIONAL HERNIA REPAIR N/A 07/23/2013   Procedure: HERNIA REPAIR INCISIONAL WITH MESH;  Surgeon: Dalia Heading, MD;  Location: [facility];  Service: General;  Laterality: N/A;   INSERTION OF MESH N/A 07/23/2013   Procedure: INSERTION OF MESH;  Surgeon: Dalia Heading, MD;  Location: [facility];  Service: General;  Laterality: N/A;   LEFT HEART CATHETERIZATION WITH  CORONARY/GRAFT ANGIOGRAM N/A 11/01/2013   Procedure: LEFT HEART CATHETERIZATION WITH Isabel Caprice;  Surgeon: Micheline Chapman, MD;  [facility] C[facility] LAB;  Service: Cardiovascular;  Laterality: N/A;   LYMPH NODE DISSECTION Bilateral 04/04/2021   Procedure: PELVIC LYMPH NODE DISSECTION;  Surgeon: Sebastian Ache, MD;  Location: [facility];  Service: Urology;  Laterality: Bilateral;   ROBOT ASSISTED LAPAROSCOPIC RADICAL PROSTATECTOMY N/A 04/04/2021   Procedure: XI ROBOTIC ASSISTED LAPAROSCOPIC RADICAL PROSTATECTOMY WITH INDOCYANINE GREEN DYE;  Surgeon: Sebastian Ache, MD;  Location: [facility];  Service: Urology;  Laterality: N/A;  3 HRS   TOE AMPUTATION  1998   right 1st and 2nd toe   TONSILECTOMY, ADENOIDECTOMY, BILATERAL MYRINGOTOMY AND TUBES     TONSILLECTOMY     VENTRAL HERNIA REPAIR N/A 10/28/2012   Procedure: LAPAROSCOPIC VENTRAL HERNIA;  Surgeon: Fabio Bering, MD;  Location: [facility];  Service: General;  Laterality: N/A;   Past Surgical History:  Procedure Laterality Date   CHOLECYSTECTOMY     CORONARY ARTERY BYPASS GRAFT  2010   LIMA to LAD, SVG to diagonal, SVG to OM1 and OM 2, SVG to RCA   DENTAL SURGERY  2003   GASTRIC BYPASS  2010   HERNIA REPAIR  2011, 2012   INCISIONAL HERNIA REPAIR N/A 07/23/2013   Procedure: HERNIA REPAIR INCISIONAL WITH MESH;  Surgeon: Dalia Heading, MD;  Location: [facility];  Service: General;  Laterality: N/A;   INSERTION OF MESH N/A 07/23/2013   Procedure: INSERTION OF MESH;  Surgeon: Dalia Heading, MD;  Location: [facility];  Service: General;  Laterality: N/A;   LEFT HEART CATHETERIZATION WITH CORONARY/GRAFT ANGIOGRAM N/A 11/01/2013   Procedure: LEFT HEART CATHETERIZATION WITH Isabel Caprice;  Surgeon: Micheline Chapman, MD;  Location: [facility] CATH LAB;  Service: Cardiovascular;  Laterality: N/A;   LYMPH NODE DISSECTION Bilateral 04/04/2021   Procedure: PELVIC LYMPH NODE DISSECTION;  Surgeon: Sebastian Ache, MD;  Location: [facility];  Service: Urology;   Laterality: Bilateral;   ROBOT ASSISTED LAPAROSCOPIC RADICAL PROSTATECTOMY N/A 04/04/2021   Procedure: XI ROBOTIC ASSISTED LAPAROSCOPIC RADICAL PROSTATECTOMY WITH INDOCYANINE GREEN DYE;  Surgeon: Sebastian Ache, MD;  Location: [facility];  Service: Urology;  Laterality: N/A;  3 HRS   TOE AMPUTATION  1998   right 1st and 2nd toe   TONSILECTOMY, ADENOIDECTOMY, BILATERAL MYRINGOTOMY AND TUBES     TONSILLECTOMY     VENTRAL HERNIA REPAIR N/A 10/28/2012   Procedure: LAPAROSCOPIC VENTRAL HERNIA;  Surgeon: Fabio Bering, MD;  Location: [facility];  Service: General;  Laterality: N/A;   Past Medical History:  Diagnosis Date   Anaphylaxis    IGE mediated   Anxiety    Arthritis    Cancer (HCC)    Prostate   Cardiomyopathy (HCC)    CHF (congestive heart failure) (HCC)    COPD (chronic obstructive pulmonary disease) (HCC)    Coronary atherosclerosis of native coronary artery    Multivessel status post CABG 2010   Depression    Essential hypertension    History of CVA (cerebrovascular accident) 01/2012   Right posterior frontal cortical and subcortical brain by MRI, no hemorrhage. Carotid Dopplers showed only 1-50% bilateral ICA stenoses. Echocardiogram showed LVEF 50%, no major valvular abnormalities.   History of kidney stones    Mixed hyperlipidemia    Myocardial infarction (HCC) 2010   OSA (obstructive sleep apnea)    Pneumonia    Prostate cancer (HCC) 12/2020   Dr. Berneice Heinrich at [facility]   Stroke [facility])    Type 2 diabetes mellitus (HCC)    BP (!) 94/56   Pulse 77   Ht 5\' 11"  (1.803 m)   Wt 174 lb (78.9 kg)   SpO2 92%   BMI 24.27 kg/m   Opioid Risk Score:   Fall Risk Score:  `1  Depression screen [facility] 2/9     11/22/2022   11:36 AM 10/04/2022    1:21 PM 08/23/2022   12:56 PM 07/12/2022   12:54 PM 05/21/2022   11:23 AM 04/04/2022   10:50 AM 02/15/2022    1:20 PM  Depression screen PHQ 2/9  Decreased Interest 0 1 1 0 1 0 2  Down, Depressed, Hopeless 0 1 1 0 1 0 2  PHQ - 2 Score 0 2  2 0 2 0 4     Review of Systems  Musculoskeletal:        LT arm hip leg foot pain  All other systems reviewed and are negative.      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  Post stroke shoulder pain, + impingement sign, and signs of adhesive capsulitis,  good results for ~6-8wks with last injection , now with recurrent pain will repeat   Shoulder injection Left glenohumeral  With ultrasound guidance  Indication:LEFT Shoulder pain not relieved by medication management and other conservative care.  Informed consent was obtained after describing risks and benefits of the procedure with the patient, this includes bleeding, bruising, infection and medication side effects. The patient wishes to proceed and has given written consent. Patient was placed in a seated position. The LEFT shoulder was marked and prepped with  betadine in the subacromial area. A 25-gauge 1-1/2 inch needle was inserted into the subacromial area. After negative draw back for blood, a solution containing 1 mL of 6 mg per ML betamethasone and 4 mL of 1% lidocaine was injected. A band aid was applied. The patient tolerated the procedure well. Post procedure instructions were given.

## 2022-12-30 DIAGNOSIS — R2689 Other abnormalities of gait and mobility: Secondary | ICD-10-CM | POA: Diagnosis not present

## 2023-01-03 ENCOUNTER — Ambulatory Visit: Payer: Medicare HMO | Admitting: Physical Medicine & Rehabilitation

## 2023-01-27 DIAGNOSIS — E1165 Type 2 diabetes mellitus with hyperglycemia: Secondary | ICD-10-CM | POA: Diagnosis not present

## 2023-01-27 DIAGNOSIS — G8110 Spastic hemiplegia affecting unspecified side: Secondary | ICD-10-CM | POA: Diagnosis not present

## 2023-01-27 DIAGNOSIS — Z6825 Body mass index (BMI) 25.0-25.9, adult: Secondary | ICD-10-CM | POA: Diagnosis not present

## 2023-01-27 DIAGNOSIS — F39 Unspecified mood [affective] disorder: Secondary | ICD-10-CM | POA: Diagnosis not present

## 2023-01-27 DIAGNOSIS — Z79899 Other long term (current) drug therapy: Secondary | ICD-10-CM | POA: Diagnosis not present

## 2023-01-27 DIAGNOSIS — M79662 Pain in left lower leg: Secondary | ICD-10-CM | POA: Diagnosis not present

## 2023-01-27 DIAGNOSIS — F1120 Opioid dependence, uncomplicated: Secondary | ICD-10-CM | POA: Diagnosis not present

## 2023-01-27 DIAGNOSIS — I639 Cerebral infarction, unspecified: Secondary | ICD-10-CM | POA: Diagnosis not present

## 2023-01-27 DIAGNOSIS — G894 Chronic pain syndrome: Secondary | ICD-10-CM | POA: Diagnosis not present

## 2023-01-27 DIAGNOSIS — M545 Low back pain, unspecified: Secondary | ICD-10-CM | POA: Diagnosis not present

## 2023-01-30 ENCOUNTER — Encounter: Payer: Self-pay | Admitting: Cardiology

## 2023-01-30 ENCOUNTER — Ambulatory Visit: Payer: Medicare HMO | Attending: Cardiology | Admitting: Cardiology

## 2023-01-30 VITALS — BP 108/54 | HR 79 | Ht 71.0 in | Wt 190.0 lb

## 2023-01-30 DIAGNOSIS — I5022 Chronic systolic (congestive) heart failure: Secondary | ICD-10-CM | POA: Diagnosis not present

## 2023-01-30 DIAGNOSIS — E782 Mixed hyperlipidemia: Secondary | ICD-10-CM | POA: Diagnosis not present

## 2023-01-30 DIAGNOSIS — Z79899 Other long term (current) drug therapy: Secondary | ICD-10-CM | POA: Diagnosis not present

## 2023-01-30 DIAGNOSIS — I25119 Atherosclerotic heart disease of native coronary artery with unspecified angina pectoris: Secondary | ICD-10-CM | POA: Diagnosis not present

## 2023-01-30 NOTE — Progress Notes (Signed)
Cardiology Office Note  Date: 01/30/2023   ID: Timmothy Sours, DOB 03-Jan-1966, MRN [id number]  History of Present Illness: [name] is a 57 y.o. male last seen in January by Ms. Lorna Few, I reviewed the note.  He is here today for a follow-up visit.  He has lost a significant amount of weight through diet and on Ozempic.  Reports no progressive angina on medical therapy and with current level of activity.  He is in a wheelchair with residual from prior stroke.  Records indicate ER visit at [facility] in June for evaluation of chest discomfort.  No clear evidence of ACS by cardiac enzymes with outpatient follow-up recommended.  He continues to follow with Dr. Reuel Boom.  I reviewed his interval lab work.  Last LDL was 103 in January.  He is currently on Lipitor 80 mg daily and we will plan to get a follow-up FLP.  Otherwise cardiac regimen remains stable.  Go cardiogram from February revealed stable LVEF in the range of 40 to 45%.  Physical Exam: VS:  BP (!) 108/54 (BP Location: Right Arm, Patient Position: Sitting, Cuff Size: Normal)   Pulse 79   Ht 5\' 11"  (1.803 m)   Wt 190 lb (86.2 kg)   SpO2 95%   BMI 26.50 kg/m , BMI Body mass index is 26.5 kg/m.  Wt Readings from Last 3 Encounters:  01/30/23 190 lb (86.2 kg)  12/27/22 174 lb (78.9 kg)  11/22/22 193 lb (87.5 kg)    General: Patient appears comfortable at rest. HEENT: Conjunctiva and lids normal. Neck: Supple, no elevated JVP or carotid bruits. Lungs: Clear to auscultation, nonlabored breathing at rest. Cardiac: Regular rate and rhythm, no S3, 1/6 systolic murmur. Extremities: Mild ankle edema, left greater than right.  ECG:  An ECG dated 09/27/2021 was personally reviewed today and demonstrated:  Sinus rhythm with old anteroseptal infarct pattern.  Labwork: No results found for requested labs within last 365 days.     Component Value Date/Time   CHOL 156 11/01/2013 0710   TRIG 185 (H)  11/01/2013 0710   HDL 27 (L) 11/01/2013 0710   CHOLHDL 5.8 11/01/2013 0710   VLDL 37 11/01/2013 0710   LDLCALC 92 11/01/2013 0710   LDLDIRECT 103 (H) 07/31/2022 1551  June 2024: Potassium 3.6, BUN 24, creatinine 1.43, AST 21, ALT 47, hemoglobin 14, platelets 221, pro-BNP 220, high-sensitivity troponin I 12  Other Studies Reviewed Today:  Echocardiogram 08/15/2022:  1. Anteroseptal wall is akinetic. Apex is akinetic with dense slow  swirling blood flow but no clear apical thrombus.      . Left ventricular ejection fraction, by estimation, is 40 to 45%. The  left ventricle has mildly decreased function. The left ventricle  demonstrates regional wall motion abnormalities (see scoring  diagram/findings for description). Left ventricular  diastolic parameters were normal. The average left ventricular global  longitudinal strain is -13.0 %. The global longitudinal strain is  abnormal.   2. Right ventricular systolic function is normal. The right ventricular  size is normal. Tricuspid regurgitation signal is inadequate for assessing  PA pressure.   3. The mitral valve is normal in structure. No evidence of mitral valve  regurgitation. No evidence of mitral stenosis.   4. The tricuspid valve is abnormal.   5. The aortic valve is tricuspid. There is mild calcification of the  aortic valve. There is mild thickening of the aortic valve. Aortic valve  regurgitation is mild. No aortic stenosis is present.  6. The inferior vena cava is normal in size with greater than 50%  respiratory variability, suggesting right atrial pressure of 3 mmHg.   Assessment and Plan:  1.  Multivessel CAD status post CABG in 2010 including LIMA to LAD, SVG to diagonal, and SVG to OM1 and OM 2.  He reports no active angina at current level of activity and remains on aspirin.  Also on Coreg, Ozempic, Lipitor and Imdur.  2.  HFmrEF with ischemic cardiomyopathy, LVEF 40 to 45% by echocardiogram in February of this year.   Apical and anteroseptal akinesis noted without evidence of LV mural thrombus.  No substantial fluid retention, continues on midodrine with low normal blood pressures and propensity to hypotension, Entresto and Coreg.  3.  Mixed hyperlipidemia.  Check FLP on Lipitor 80 mg daily.  LDL was 103 in January.  4.  History of stroke with residua.  He remains on Plavix as well.  Disposition:  Follow up  6 months.  Signed, Jonelle Sidle, M.D., F.A.C.C. [facility]

## 2023-01-30 NOTE — Patient Instructions (Signed)
Medication Instructions:  °Your physician recommends that you continue on your current medications as directed. Please refer to the Current Medication list given to you today. ° ° °Labwork: °Fasting Lipids ° °Testing/Procedures: °None today ° °Follow-Up: °6 months ° °Any Other Special Instructions Will Be Listed Below (If Applicable). ° °If you need a refill on your cardiac medications before your next appointment, please call your pharmacy. ° °

## 2023-01-31 ENCOUNTER — Other Ambulatory Visit (HOSPITAL_COMMUNITY)
Admission: RE | Admit: 2023-01-31 | Discharge: 2023-01-31 | Disposition: A | Discharge status: Home / Self Care | Payer: Medicare HMO | Source: Ambulatory Visit | Attending: Urology | Admitting: Urology

## 2023-01-31 ENCOUNTER — Other Ambulatory Visit (HOSPITAL_COMMUNITY)
Admission: RE | Admit: 2023-01-31 | Discharge: 2023-01-31 | Disposition: A | Discharge status: Home / Self Care | Payer: Medicare HMO | Source: Ambulatory Visit | Attending: Cardiology | Admitting: Cardiology

## 2023-01-31 DIAGNOSIS — C61 Malignant neoplasm of prostate: Secondary | ICD-10-CM | POA: Insufficient documentation

## 2023-01-31 DIAGNOSIS — I25119 Atherosclerotic heart disease of native coronary artery with unspecified angina pectoris: Secondary | ICD-10-CM | POA: Diagnosis not present

## 2023-01-31 DIAGNOSIS — R972 Elevated prostate specific antigen [PSA]: Secondary | ICD-10-CM | POA: Insufficient documentation

## 2023-01-31 LAB — LIPID PANEL
Cholesterol: 150 mg/dL (ref 0–200)
HDL: 30 mg/dL — ABNORMAL LOW (ref >40)
LDL Cholesterol: 97 mg/dL (ref 0–99)
Total CHOL/HDL Ratio: 5 RATIO
Triglycerides: 115 mg/dL (ref <150)
VLDL: 23 mg/dL (ref 0–40)

## 2023-01-31 LAB — PSA: Prostatic Specific Antigen: 0.01 ng/mL (ref 0.00–4.00)

## 2023-02-03 DIAGNOSIS — C775 Secondary and unspecified malignant neoplasm of intrapelvic lymph nodes: Secondary | ICD-10-CM | POA: Diagnosis not present

## 2023-02-03 DIAGNOSIS — N5231 Erectile dysfunction following radical prostatectomy: Secondary | ICD-10-CM | POA: Diagnosis not present

## 2023-02-03 DIAGNOSIS — C61 Malignant neoplasm of prostate: Secondary | ICD-10-CM | POA: Diagnosis not present

## 2023-02-03 DIAGNOSIS — N393 Stress incontinence (female) (male): Secondary | ICD-10-CM | POA: Diagnosis not present

## 2023-02-07 ENCOUNTER — Telehealth: Payer: Self-pay | Admitting: *Deleted

## 2023-02-07 MED ORDER — EZETIMIBE 10 MG PO TABS: 10.0000 mg | ORAL_TABLET | Freq: Every day | ORAL | 3 refills | Status: DC

## 2023-02-07 NOTE — Telephone Encounter (Signed)
-----   Message from Nona Dell sent at 02/02/2023  8:02 PM EDT ----- Results reviewed.  Follow-up LDL is 97 on Lipitor 80 mg daily.  See if he would be willing to add Zetia 10 mg daily to get his LDL down toward goal.

## 2023-02-07 NOTE — Telephone Encounter (Signed)
Patient informed and verbalized understanding of plan. Copy sent to PCP 

## 2023-02-11 DIAGNOSIS — Z1329 Encounter for screening for other suspected endocrine disorder: Secondary | ICD-10-CM | POA: Diagnosis not present

## 2023-02-11 DIAGNOSIS — E782 Mixed hyperlipidemia: Secondary | ICD-10-CM | POA: Diagnosis not present

## 2023-02-11 DIAGNOSIS — E7849 Other hyperlipidemia: Secondary | ICD-10-CM | POA: Diagnosis not present

## 2023-02-11 DIAGNOSIS — J449 Chronic obstructive pulmonary disease, unspecified: Secondary | ICD-10-CM | POA: Diagnosis not present

## 2023-02-17 DIAGNOSIS — F4323 Adjustment disorder with mixed anxiety and depressed mood: Secondary | ICD-10-CM | POA: Diagnosis not present

## 2023-02-18 DIAGNOSIS — G8102 Flaccid hemiplegia affecting left dominant side: Secondary | ICD-10-CM | POA: Diagnosis not present

## 2023-02-18 DIAGNOSIS — I10 Essential (primary) hypertension: Secondary | ICD-10-CM | POA: Diagnosis not present

## 2023-02-18 DIAGNOSIS — E440 Moderate protein-calorie malnutrition: Secondary | ICD-10-CM | POA: Diagnosis not present

## 2023-02-18 DIAGNOSIS — E8881 Metabolic syndrome: Secondary | ICD-10-CM | POA: Diagnosis not present

## 2023-02-18 DIAGNOSIS — N1831 Chronic kidney disease, stage 3a: Secondary | ICD-10-CM | POA: Diagnosis not present

## 2023-02-18 DIAGNOSIS — F331 Major depressive disorder, recurrent, moderate: Secondary | ICD-10-CM | POA: Diagnosis not present

## 2023-02-18 DIAGNOSIS — J9611 Chronic respiratory failure with hypoxia: Secondary | ICD-10-CM | POA: Diagnosis not present

## 2023-02-18 DIAGNOSIS — F17210 Nicotine dependence, cigarettes, uncomplicated: Secondary | ICD-10-CM | POA: Diagnosis not present

## 2023-02-18 DIAGNOSIS — E7849 Other hyperlipidemia: Secondary | ICD-10-CM | POA: Diagnosis not present

## 2023-02-18 DIAGNOSIS — I25119 Atherosclerotic heart disease of native coronary artery with unspecified angina pectoris: Secondary | ICD-10-CM | POA: Diagnosis not present

## 2023-02-20 ENCOUNTER — Ambulatory Visit: Payer: Medicare HMO | Admitting: Physical Medicine & Rehabilitation

## 2023-02-24 DIAGNOSIS — Z79899 Other long term (current) drug therapy: Secondary | ICD-10-CM | POA: Diagnosis not present

## 2023-02-24 DIAGNOSIS — M79662 Pain in left lower leg: Secondary | ICD-10-CM | POA: Diagnosis not present

## 2023-02-24 DIAGNOSIS — I639 Cerebral infarction, unspecified: Secondary | ICD-10-CM | POA: Diagnosis not present

## 2023-02-24 DIAGNOSIS — F17210 Nicotine dependence, cigarettes, uncomplicated: Secondary | ICD-10-CM | POA: Diagnosis not present

## 2023-02-24 DIAGNOSIS — M545 Low back pain, unspecified: Secondary | ICD-10-CM | POA: Diagnosis not present

## 2023-02-24 DIAGNOSIS — E11618 Type 2 diabetes mellitus with other diabetic arthropathy: Secondary | ICD-10-CM | POA: Diagnosis not present

## 2023-02-24 DIAGNOSIS — G8110 Spastic hemiplegia affecting unspecified side: Secondary | ICD-10-CM | POA: Diagnosis not present

## 2023-02-24 DIAGNOSIS — Z6825 Body mass index (BMI) 25.0-25.9, adult: Secondary | ICD-10-CM | POA: Diagnosis not present

## 2023-02-24 DIAGNOSIS — F1120 Opioid dependence, uncomplicated: Secondary | ICD-10-CM | POA: Diagnosis not present

## 2023-02-24 DIAGNOSIS — G894 Chronic pain syndrome: Secondary | ICD-10-CM | POA: Diagnosis not present

## 2023-02-25 ENCOUNTER — Encounter: Payer: Self-pay | Admitting: Physical Medicine & Rehabilitation

## 2023-02-25 ENCOUNTER — Encounter: Payer: Medicare HMO | Attending: Physical Medicine & Rehabilitation | Admitting: Physical Medicine & Rehabilitation

## 2023-02-25 VITALS — BP 101/60 | HR 68 | Ht 71.0 in

## 2023-02-25 DIAGNOSIS — G8110 Spastic hemiplegia affecting unspecified side: Secondary | ICD-10-CM | POA: Insufficient documentation

## 2023-02-25 MED ORDER — SODIUM CHLORIDE (PF) 0.9 % IJ SOLN
2.5000 mL | Freq: Once | INTRAMUSCULAR | Status: AC
Start: 2023-02-25 — End: ?

## 2023-02-25 MED ORDER — ABOBOTULINUMTOXINA 500 UNITS IM SOLR
1000.0000 [IU] | Freq: Once | INTRAMUSCULAR | Status: DC
Start: 2023-02-25 — End: 2023-12-18

## 2023-02-25 MED ORDER — ABOBOTULINUMTOXINA 500 UNITS IM SOLR
500.0000 [IU] | Freq: Once | INTRAMUSCULAR | Status: DC
Start: 2023-02-25 — End: 2023-02-25

## 2023-02-25 NOTE — Patient Instructions (Signed)
You received a Dysport injection today. You may experience muscle pains and aches. He may apply ice 20 minutes every 2 hours as needed for the next 24-48 hours. He also noticed bleeding or bruising in the areas that were injected. May apply Band-Aid. If this bruising is extensive, please notify our office. If there is evidence of increasing redness that occurs 2-3 days after injection. Please call our office. This could be a sign of infection. It is very rare, however. You may experience some muscle weakness in the muscles and injected. This would likely start in about one week.  

## 2023-02-25 NOTE — Progress Notes (Signed)
Dysport Injection for spasticity using needle EMG guidance  Dilution: 200 Units/ml Indication: Severe spasticity which interferes with ADL,mobility and/or  hygiene and is unresponsive to medication management and other conservative care Informed consent was obtained after describing risks and benefits of the procedure with the patient. This includes bleeding, bruising, infection, excessive weakness, or medication side effects. A REMS form is on file and signed. Needle:  needle electrode Number of units per muscle LUE- 500U total FDS 100-  FDP 100-   FCR 100  FPL 100   Left opponens pollicis 100    LLE 500U total   Hamstring 300U 100U med and 200U lat   Tibialis post  100U    Soleus  100U  All injections were done after obtaining appropriate EMG activity and after negative drawback for blood. The patient tolerated the procedure well. Post procedure instructions were given. A followup appointment was made.

## 2023-02-26 DIAGNOSIS — Z79899 Other long term (current) drug therapy: Secondary | ICD-10-CM | POA: Diagnosis not present

## 2023-04-10 ENCOUNTER — Encounter: Payer: Self-pay | Admitting: Physical Medicine & Rehabilitation

## 2023-04-10 ENCOUNTER — Encounter: Payer: Medicare HMO | Attending: Physical Medicine & Rehabilitation | Admitting: Physical Medicine & Rehabilitation

## 2023-04-10 VITALS — BP 102/65 | HR 64 | Ht 71.0 in | Wt 188.0 lb

## 2023-04-10 DIAGNOSIS — S43002S Unspecified subluxation of left shoulder joint, sequela: Secondary | ICD-10-CM | POA: Diagnosis not present

## 2023-04-10 DIAGNOSIS — S43002D Unspecified subluxation of left shoulder joint, subsequent encounter: Secondary | ICD-10-CM

## 2023-04-10 DIAGNOSIS — M7552 Bursitis of left shoulder: Secondary | ICD-10-CM | POA: Diagnosis not present

## 2023-04-10 MED ORDER — BETAMETHASONE SOD PHOS & ACET 6 (3-3) MG/ML IJ SUSP
6.0000 mg | Freq: Once | INTRAMUSCULAR | Status: AC
Start: 2023-04-10 — End: 2023-04-10
  Administered 2023-04-10: 6 mg via INTRA_ARTICULAR

## 2023-04-10 MED ORDER — LIDOCAINE HCL 1 % IJ SOLN
5.0000 mL | Freq: Once | INTRAMUSCULAR | Status: AC
Start: 2023-04-10 — End: 2023-04-10
  Administered 2023-04-10: 5 mL

## 2023-04-10 NOTE — Patient Instructions (Signed)
You received a cortisone injection for your left shoulder pain.  You have no restrictions after this procedure.  If there is soreness please use Tylenol or use an ice pack for 15 to 20 minutes every 2 hours as needed for the first 24 to 48 hours.

## 2023-04-10 NOTE — Progress Notes (Signed)
Shoulder injectionLeft  With  ultrasound guidance-[id number]  Indication:LEFT Shoulder pain not relieved by medication management and other conservative care.  Informed consent was obtained after describing risks and benefits of the procedure with the patient, this includes bleeding, bruising, infection and medication side effects. The patient wishes to proceed and has given written consent. Patient was placed in a seated position.THe left post subacromial area was scanned with 15hz  linear transducer The LEFT shoulder was marked and prepped with betadine in the subacromial area. A sterile cover was placed on the transducer and sterile [place] gel was used.  A 25-gauge 1-1/2 inch needle was inserted under long axis [place] view infiltrating 2ml in subcut tissue. Then a 22g 50mm ECHO BLOC needle was inserted under US guidance into the subacromial space, a solution containing 1 mL of 6 mg per ML betamethasone and 4 mL of 1% lidocaine was injected. A band aid was applied. The patient tolerated the procedure well. Post procedure instructions were given.    Also reevaluated left upper limb and left lower limb tone due to spastic hemiplegia.  Underwent Dysport injection 1000 units last month using the following protocol  LUE- 500U total FDS 100-   MAS 0 FDP 100-   MAS 0  FCR 100   MAS 0 FPL 100    MAS 0  Left opponens pollicis 100 MAS 0   LLE 500U total   Hamstring 300U 100U med and 200U lat MAS 3   Tibialis post  100U     Soleus  100U MAS 3   Based on examination above would recommend increasing Dysport to 1500 units Would add 300 units to the left hamstrings and 200 units to the left soleus

## 2023-04-14 DIAGNOSIS — E119 Type 2 diabetes mellitus without complications: Secondary | ICD-10-CM | POA: Diagnosis not present

## 2023-04-14 DIAGNOSIS — E782 Mixed hyperlipidemia: Secondary | ICD-10-CM | POA: Diagnosis not present

## 2023-04-14 DIAGNOSIS — M546 Pain in thoracic spine: Secondary | ICD-10-CM | POA: Diagnosis not present

## 2023-04-14 DIAGNOSIS — I5030 Unspecified diastolic (congestive) heart failure: Secondary | ICD-10-CM | POA: Diagnosis not present

## 2023-04-17 NOTE — Progress Notes (Signed)
NEUROLOGY FOLLOW UP OFFICE NOTE  [name] [id number]  Assessment/Plan:   Two episodes of left sided facial numbness and speech difficulty in setting of occasional palpitations.  Concern for TIA vs CVA due to cardioembolic etiology such as atrial fibrillation.   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  Secondary stroke prevention as managed by PCP/cardiology:  - ASA and Plavix   - Statin/Zetia.  LDL goal less than 70  - Hgb A1c goal less than 7  - Normotensive blood pressure 3.  To treat diabetic neuropathy/neuralgia:  increase Lyrica to 150mg  twice daily 4.  Follow up 6 months.   Subjective:  [name] is a 57 year old right-handed man with hypertension, type 2 diabetes mellitus, CAD, ischemic cardiomyopathy, COPD, tobacco use disorder, depression, prostate cancer and history of prior strokes who follows up for migraines.  History supplemented by his accompanying niece.     UPDATE: Current medications:  ASA 81mg , Plavix 75mg , atorvastatin 80mg , Zetia, Coreg, Imdur,  alprazolam 1mg  PRN, oxycodone, naloxone, baclofen, Cymbalta 60mg  twice daily, Lyrica 100mg  BID, baclofen  Lyrica was increased to 100mg  twice daily.  Had right sided occipital nerve block in May.    Occipital nerve block was effective until one week ago.   Increased Lyrica has not been helpful for his peripheral neuropathy.  In the past couple of months, he woke up in the morning with left sided facial numbness and having difficulty talking.  It has happened twice and lasted 1/2 a day.  Second episode occurred 2 days ago.  He reports feeling occasional palpitations.    LDL in July was 97.  His cardiologist started him on Zetia in addition to the atorvastatin.    HISTORY: He was admitted to [facility] on 06/14/17 with sudden onset left sided weakness and numbness and difficulty speaking.  He initially presented to [facility] where he received tPA with NIHSS of 15 and was then transferred to [facility].    CT of head from [facility] reportedly showed "acute infarction involving portions of the posterior right basal ganglia, right corona radiata and body of the right caudate nucleus".  MRI of brain at [facility] confirmed acute right basal ganglia infarct as well as remote right frontal infarct.  MRA of head and carotid doppler revealed no significant intracranial or extracranial arterial stenosis or occlusion.  He was unable to have CTA due to allergy to iodinated contrast.    Echocardiogram with bubble study showed EF of 25-30% with no evidence of PFO or ASD.  He was evaluated by cardiology for ischemic cardiomyopathy.  Lexiscan nuclear perfusion study revealed EF 18% with large infarct involving the apex and periapical anterior wall with no evidence of ischemia.  He was advised to follow up with outpatient cardiology.  LDL was 123 and triglycerides were 237.  Hgb A1c was 5.7.  He was already taking ASA 325mg  but not daily, as well as Plavix.  He is continued on ASA and Plavix.  06/30/2019 Echocardiogram:  LV EF 40-50%   He was seen in the ED at [facility] on 09/22/17 for headache.  CT of head showed no acute findings but was read as demonstrating a chronic appearing infarct in the right posterior limb of  internal capsule that was not present on prior CT from 06/14/17.  He also complained of left leg pain.  He reportedly had an elevated d dimer.  CT chest and venous doppler of lower extremity were negative for DVT.  However, the started him on Xarelto.  He was told to stop Plavix by his PCP.  I contacted his PCP, Dr. Reuel Boom, who reviewed  the ED notes and is also unsure why they started Xarelto when imaging was negative for DVT.  He was advised to discontinue Xarelto and restart Plavix with aspirin.   Headaches returned in December 2020.  Right sided 10/10 throbbing/pressure pain in the temple radiating to back of head.  Associated nausea, blurred vision, photophobia.  They last 2 hours and occur 2 to 3 days a week.  He reports right occipital/suboccipital numbness and tingling off and on.  He also notes that his right hand twitches about every other day, lasting 20-30 minutes.  It may occur while holding a cup but also at rest.  Due to worsening headache, MRI of brain without contrast was performed on 09/13/2019, which was personally reviewed, and demonstrated stable advanced chronic small vessel ischemic changes but no acute intracranial abnormality. Due to right hand myoclonus, EEG was performed on 08/17/2019, which was normal.  MRI of cervical spine on 08/08/2020 showed mild cervical spondylosis with mild spinal stenosis and mild to moderate neural foraminal stenosis at C3-4 and C4-5.  In April 2023, he was hospitalized at [facility] for altered mental status and acute respiratory failure.  Unsure if he was having a stroke or possibly status epilepticus.  He had significant hypotension.  Unable to get an MRI due to body habitus.  Serial head CTs revealed remote right hemispheric encephalomalacia but no acute findings.  CTA head and neck revealed atherosclerosis in both carotid bifurcations and flow lmiting stenosis of the left ICA at the distal bulb but on LVO or significant intracranial stenosis.  Long term video EEG monitoring revealed generalized slowing but no epileptiform discharges or electrographic seizures.    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  He did go to PT and speech therapy.      He sees Dr. Wynn Banker for Dysport injection to treat spasticity.  He reports panic attacks.  He sees Dr. Evelene Croon for depression and anxiety.   Past medications:  gabapentin 600mg  BID    PAST MEDICAL HISTORY: Past Medical History:  Diagnosis Date   Anaphylaxis    IGE mediated   Anxiety    Arthritis    Cancer (HCC)    Prostate   Cardiomyopathy (HCC)    CHF (congestive heart failure) (HCC)    COPD (chronic obstructive pulmonary disease) (HCC)    Coronary atherosclerosis of native coronary artery    Multivessel status post CABG 2010   Depression    Essential hypertension    History of CVA (cerebrovascular accident) 01/2012   Right posterior frontal cortical and subcortical brain by MRI, no hemorrhage. Carotid Dopplers showed only 1-50% bilateral ICA stenoses. Echocardiogram showed LVEF 50%, no major valvular abnormalities.   History of kidney stones    Mixed hyperlipidemia    Myocardial infarction (HCC) 2010   OSA (obstructive sleep apnea)    Pneumonia    Prostate cancer (HCC) 12/2020   Dr. Berneice Heinrich at [facility]   Stroke [facility])    Type 2 diabetes mellitus [facility])     MEDICATIONS: Current Outpatient  Medications on File Prior to Visit  Medication Sig Dispense Refill   acetaminophen (TYLENOL) 500 MG tablet Take 1,000 mg by mouth every 6 (six) hours as needed for moderate pain or headache.     albuterol (PROVENTIL HFA;VENTOLIN HFA) 108 (90 BASE) MCG/ACT inhaler Inhale 2 puffs into the lungs every 6 (six) hours as needed for wheezing or shortness of breath.     ALPRAZolam (XANAX) 1 MG tablet Take 1 mg by mouth 2 (two) times daily as needed.     aspirin EC 81 MG tablet Take 1 tablet (81 mg total) by mouth daily. 90 tablet 3   atorvastatin (LIPITOR) 80 MG tablet Take 1 tablet (80 mg total) by mouth daily. 90 tablet 3   baclofen (LIORESAL) 20 MG tablet Take by mouth.     bisacodyl (DULCOLAX) 5 MG EC tablet Take by mouth.     carvedilol (COREG) 3.125 MG tablet Take 1 tablet (3.125  mg total) by mouth 2 (two) times daily with a meal. 180 tablet 3   Cholecalciferol (VITAMIN D) 50 MCG (2000 UT) CAPS 1 capsule     clopidogrel (PLAVIX) 75 MG tablet [id number]  Plavix 75 mg tablet 75 milligrams PO once a day for ? CVA; 1 tab po daily     COSENTYX UNOREADY 300 MG/2ML SOAJ Inject into the skin.     dapsone 25 MG tablet Take by mouth.     dapsone 25 MG tablet Take by mouth.     dibucaine (NUPERCAINAL) 1 % OINT 1 application as needed     diltiazem (CARDIZEM SR) 120 MG 12 hr capsule Take 120 mg by mouth daily at 2 PM.     DULoxetine (CYMBALTA) 60 MG capsule Take 1 capsule (60 mg total) by mouth daily. (Patient taking differently: Take 60 mg by mouth 2 (two) times daily.) 90 capsule 0   ezetimibe (ZETIA) 10 MG tablet Take 1 tablet (10 mg total) by mouth daily. 90 tablet 3   furosemide (LASIX) 20 MG tablet Take 20 mg by mouth.     isosorbide mononitrate (IMDUR) 30 MG 24 hr tablet 1 tablet in the morning     isosorbide mononitrate (IMDUR) 60 MG 24 hr tablet Take 60 mg by mouth 2 (two) times daily.      midodrine (PROAMATINE) 10 MG tablet Take 10 mg by mouth 3 (three) times daily.     naloxone (NARCAN) nasal spray 4 mg/0.1 mL Place 1 spray into the nose as needed (opioid overdose).     nitroGLYCERIN (NITROSTAT) 0.4 MG SL tablet 1 tablet as directed     nystatin (MYCOSTATIN) 100000 UNIT/ML suspension Take by mouth.     oxyCODONE (OXY IR/ROXICODONE) 5 MG immediate release tablet Take 5 mg by mouth 3 (three) times daily as needed.     oxyCODONE-acetaminophen (PERCOCET) 10-325 MG tablet Take 1 tablet by mouth 3 (three) times daily as needed.     oxyCODONE-acetaminophen (PERCOCET) 7.5-325 MG tablet Take 1 tablet by mouth 3 (three) times daily as needed.     OZEMPIC, 1 MG/DOSE, 4 MG/3ML SOPN Inject 1 mg into the skin once a week.     polyethylene glycol powder (GLYCOLAX/MIRALAX) 17 GM/SCOOP powder Take 17 g by mouth daily.     pregabalin (LYRICA) 100 MG capsule Take 1 capsule (100 mg total) by  mouth 2 (two) times daily. 60 capsule 5   pregabalin (LYRICA) 75 MG capsule Take 75 mg by mouth 2 (two) times daily.     sacubitril-valsartan (ENTRESTO) 49-51  MG Take 1 tablet by mouth 2 (two) times daily. 180 tablet 3   Current Facility-Administered Medications on File Prior to Visit  Medication Dose Route Frequency Provider Last Rate Last Admin   AbobotulinumtoxinA (DYSPORT) 500 units injection 1,000 Units  1,000 Units Intramuscular Once        betamethasone acetate-betamethasone sodium phosphate (CELESTONE) injection 12 mg  12 mg Intramuscular Once Kirsteins, Victorino Sparrow, MD       lidocaine (XYLOCAINE) 1 % (with pres) injection 4 mL  4 mL Other Once Kirsteins, Victorino Sparrow, MD       sodium chloride (PF) 0.9 % injection 2.5 mL  2.5 mL Other Once         ALLERGIES: Allergies  Allergen Reactions   Ibuprofen Anaphylaxis, Hives and Swelling   Iodinated Contrast Media Anaphylaxis, Shortness Of Breath, Swelling, Rash and Itching    Isovue contrast is most acceptable agent based on previous experience and testing with premedications Other reaction(s): throat swells up Other reaction(s): throat swells up Other reaction(s): Other   Nsaids Anaphylaxis, Hives, Swelling and Rash    Can take Aspirin 325 mg or lower Other reaction(s): rash Other reaction(s): rash   Ace Inhibitors     Other reaction(s): Unknown Other reaction(s): Unknown   Chantix [Varenicline]     Nightmares    Other Other (See Comments)    Anti-physcotics Personality change   Pollen Extract     Other reaction(s): Unknown Other reaction(s): Unknown    FAMILY HISTORY: Family History  Problem Relation Age of Onset   Lung cancer Father    Heart disease Father    Heart disease Mother    Congestive Heart Failure Mother    Diabetes Mother    Hyperlipidemia Mother    Alcohol abuse Brother    Breast cancer Maternal Aunt    Suicidality Cousin       Objective:  Blood pressure 97/60, pulse 64, height 5\' 11"  (1.803 m), weight  188 lb (85.3 kg), SpO2 96%. General: No acute distress.  Patient appears well-groomed.   Head:  Normocephalic/atraumatic Eyes:  Fundi examined but not visualized Neck: supple, no paraspinal tenderness, full range of motion Heart:  Regular rate and rhythm Neurological Exam: alert and oriented.  Speech fluent and not dysarthric, language intact.  Decreased left V2.  Otherwise, CN II-XII intact. Tone flaccid in left upper extremity.  Muscle strength plegic in left upper extremity except 2+/5 hand grip, 2+/5 left hip flexion, plegic left knee flexion/extension, left foot drop, 5/5 right upper and lower extremities.  Sensation to light touch reduced in left upper and lower extremities.  DTR 3+ left upper and lower extremities, 2+ right upper and lower extremities.  Finger to nose intact on right, unable to assess left; Gait deferred.   Shon Millet, DO  CC: Donzetta Sprung, MD

## 2023-04-18 ENCOUNTER — Encounter: Payer: Self-pay | Admitting: Neurology

## 2023-04-18 ENCOUNTER — Ambulatory Visit (INDEPENDENT_AMBULATORY_CARE_PROVIDER_SITE_OTHER): Payer: Medicare HMO | Admitting: Neurology

## 2023-04-18 ENCOUNTER — Ambulatory Visit (HOSPITAL_COMMUNITY)
Admission: RE | Admit: 2023-04-18 | Discharge: 2023-04-18 | Disposition: A | Discharge status: Home / Self Care | Payer: Medicare HMO | Source: Ambulatory Visit | Attending: Neurology

## 2023-04-18 VITALS — BP 97/60 | HR 64 | Ht 71.0 in | Wt 188.0 lb

## 2023-04-18 DIAGNOSIS — G459 Transient cerebral ischemic attack, unspecified: Secondary | ICD-10-CM | POA: Insufficient documentation

## 2023-04-18 DIAGNOSIS — I10 Essential (primary) hypertension: Secondary | ICD-10-CM | POA: Diagnosis not present

## 2023-04-18 DIAGNOSIS — M5481 Occipital neuralgia: Secondary | ICD-10-CM | POA: Diagnosis not present

## 2023-04-18 DIAGNOSIS — Z8673 Personal history of transient ischemic attack (TIA), and cerebral infarction without residual deficits: Secondary | ICD-10-CM | POA: Diagnosis not present

## 2023-04-18 DIAGNOSIS — E118 Type 2 diabetes mellitus with unspecified complications: Secondary | ICD-10-CM | POA: Diagnosis not present

## 2023-04-18 DIAGNOSIS — I69354 Hemiplegia and hemiparesis following cerebral infarction affecting left non-dominant side: Secondary | ICD-10-CM | POA: Diagnosis not present

## 2023-04-18 DIAGNOSIS — I6782 Cerebral ischemia: Secondary | ICD-10-CM | POA: Diagnosis not present

## 2023-04-18 DIAGNOSIS — Z794 Long term (current) use of insulin: Secondary | ICD-10-CM | POA: Diagnosis not present

## 2023-04-18 DIAGNOSIS — M792 Neuralgia and neuritis, unspecified: Secondary | ICD-10-CM

## 2023-04-18 DIAGNOSIS — R002 Palpitations: Secondary | ICD-10-CM | POA: Diagnosis not present

## 2023-04-18 DIAGNOSIS — R531 Weakness: Secondary | ICD-10-CM | POA: Diagnosis not present

## 2023-04-18 MED ORDER — PREGABALIN 150 MG PO CAPS: 150.0000 mg | ORAL_CAPSULE | Freq: Two times a day (BID) | ORAL | 5 refills | Status: DC

## 2023-04-18 NOTE — Patient Instructions (Signed)
Increase pregablin to 150mg  twice daily Schedule another occipital nerve block Order MRI of brain without contrast Will contact cardiology about 30 day cardiac monitor Follow up 6 months.

## 2023-04-18 NOTE — Addendum Note (Signed)
Addended by: Leida Lauth on: 04/18/2023 12:27 PM   Modules accepted: Orders

## 2023-04-23 ENCOUNTER — Telehealth: Payer: Self-pay

## 2023-04-23 DIAGNOSIS — G459 Transient cerebral ischemic attack, unspecified: Secondary | ICD-10-CM

## 2023-04-23 NOTE — Telephone Encounter (Signed)
LMOVM for patient.  

## 2023-04-23 NOTE — Telephone Encounter (Signed)
-----   Message from Cira Servant sent at 04/18/2023  4:25 PM EDT ----- MRI looks pretty much unchanged compared to prior MRI from 2021.  Nothing acute or recent.  I would still like to go ahead and order the 30 day cardiac event monitor.

## 2023-04-24 NOTE — Telephone Encounter (Signed)
LMOVM, We can offer a headache cocktail or Prednisone taper.  Dr.jaffe is out of the office right now. If the headache gets worse please go to a Urgent care for one.

## 2023-04-24 NOTE — Telephone Encounter (Signed)
Left message with the after hour service on 04-24-23 at 12:47   Caller states that he has called a couple of time this morning about the results. He has had a headache for the last 2 days and he takes Oyxcodone  TID for stroke related pain  please call

## 2023-04-24 NOTE — Telephone Encounter (Signed)
Pt called back in wanting his MRI results. He stated he did not need a headache cocktail or prednisone taper.

## 2023-04-24 NOTE — Telephone Encounter (Signed)
Pt called requesting the results , states he missed a phone call as well

## 2023-04-24 NOTE — Telephone Encounter (Signed)
Pt called in and left a message with the access nurse. He was returning a call about his results

## 2023-04-24 NOTE — Progress Notes (Signed)
LMOVM for patient.  

## 2023-04-25 NOTE — Telephone Encounter (Signed)
Per MRI note, MRI looks pretty much unchanged compared to prior MRI from 2021.  Nothing acute or recent.  I would still like to go ahead and order the 30 day cardiac event monitor.

## 2023-05-05 ENCOUNTER — Ambulatory Visit: Payer: Medicare HMO | Admitting: Neurology

## 2023-05-06 ENCOUNTER — Encounter: Payer: Self-pay | Admitting: Neurology

## 2023-05-13 ENCOUNTER — Ambulatory Visit (INDEPENDENT_AMBULATORY_CARE_PROVIDER_SITE_OTHER): Payer: Medicare HMO | Admitting: Neurology

## 2023-05-13 DIAGNOSIS — M5481 Occipital neuralgia: Secondary | ICD-10-CM

## 2023-05-13 MED ORDER — BUPIVACAINE HCL 0.25 % IJ SOLN
3.0000 mL | Freq: Once | INTRAMUSCULAR | Status: AC
Start: 2023-05-13 — End: 2023-05-13
  Administered 2023-05-13: 3 mL via INTRA_ARTICULAR

## 2023-05-13 MED ORDER — TRIAMCINOLONE ACETONIDE 40 MG/ML IJ SUSP
40.0000 mg | Freq: Once | INTRAMUSCULAR | Status: AC
Start: 2023-05-13 — End: 2023-05-13
  Administered 2023-05-13: 40 mg via INTRA_ARTICULAR

## 2023-05-13 NOTE — Progress Notes (Signed)
Procedure: Occipital nerve block  Procedure risks, benefits and alternative treatments explained to patient and they agree to proceed.   A concentration of 0.25% bupivacaine (3 ml) was mixed with 40 mg of Kenalog (1 ml). A 27 gauge needle was used for injection. The region of the right greater and lesser occipital nerves were located by palpation. The area was prepped with alcohol. A total of 4 cc of the above mixture was injected without difficulty. The patient tolerated the procedure well.    Clements Toro R. Everlena Cooper, DO

## 2023-05-27 ENCOUNTER — Encounter: Payer: Medicare HMO | Attending: Physical Medicine & Rehabilitation | Admitting: Physical Medicine & Rehabilitation

## 2023-05-27 ENCOUNTER — Encounter: Payer: Self-pay | Admitting: Physical Medicine & Rehabilitation

## 2023-05-27 VITALS — BP 93/58 | HR 69 | Ht 71.0 in | Wt 203.8 lb

## 2023-05-27 DIAGNOSIS — G8110 Spastic hemiplegia affecting unspecified side: Secondary | ICD-10-CM | POA: Insufficient documentation

## 2023-05-27 MED ORDER — ABOBOTULINUMTOXINA 500 UNITS IM SOLR
1000.0000 [IU] | Freq: Once | INTRAMUSCULAR | Status: AC
Start: 2023-05-27 — End: 2023-05-27
  Administered 2023-05-27: 1000 [IU] via INTRAMUSCULAR

## 2023-05-27 MED ORDER — SODIUM CHLORIDE (PF) 0.9 % IJ SOLN
7.5000 mL | Freq: Once | INTRAMUSCULAR | Status: AC
Start: 2023-05-27 — End: 2023-05-27
  Administered 2023-05-27: 7.5 mL

## 2023-05-27 NOTE — Progress Notes (Signed)
Dysport Injection for spasticity using needle EMG guidance  Dilution: 200 Units/ml Indication: Severe spasticity which interferes with ADL,mobility and/or  hygiene and is unresponsive to medication management and other conservative care Informed consent was obtained after describing risks and benefits of the procedure with the patient. This includes bleeding, bruising, infection, excessive weakness, or medication side effects. A REMS form is on file and signed. Needle:  needle electrode Number of units per muscle LUE- 500U total FDS 100-   MAS 0 FDP 100-   MAS 0  FCR 100   MAS 0 FPL 100    MAS 0  Left opponens pollicis 100 MAS 0   LLE 500U total   100U semimembranosus and 100U Semitendinosis MAS 3   Tibialis post  100U    med gastroc 100U Soleus  100U MAS 3 All injections were done after obtaining appropriate EMG activity and after negative drawback for blood. The patient tolerated the procedure well. Post procedure instructions were given. A followup appointment was made.

## 2023-05-27 NOTE — Patient Instructions (Signed)
You received a Dysport injection today. You may experience muscle pains and aches. He may apply ice 20 minutes every 2 hours as needed for the next 24-48 hours. He also noticed bleeding or bruising in the areas that were injected. May apply Band-Aid. If this bruising is extensive, please notify our office. If there is evidence of increasing redness that occurs 2-3 days after injection. Please call our office. This could be a sign of infection. It is very rare, however. You may experience some muscle weakness in the muscles and injected. This would likely start in about one week.  

## 2023-07-01 ENCOUNTER — Encounter: Payer: Self-pay | Admitting: Neurology

## 2023-07-11 ENCOUNTER — Encounter: Payer: Medicare HMO | Attending: Physical Medicine & Rehabilitation | Admitting: Physical Medicine & Rehabilitation

## 2023-07-11 ENCOUNTER — Encounter: Payer: Self-pay | Admitting: Physical Medicine & Rehabilitation

## 2023-07-11 VITALS — BP 95/61 | HR 73 | Ht 71.0 in

## 2023-07-11 DIAGNOSIS — G8110 Spastic hemiplegia affecting unspecified side: Secondary | ICD-10-CM | POA: Insufficient documentation

## 2023-07-11 DIAGNOSIS — G8114 Spastic hemiplegia affecting left nondominant side: Secondary | ICD-10-CM

## 2023-07-11 MED ORDER — LIDOCAINE HCL 1 % IJ SOLN
5.0000 mL | Freq: Once | INTRAMUSCULAR | Status: AC
Start: 2023-07-11 — End: 2023-07-11
  Administered 2023-07-11: 5 mL

## 2023-07-11 MED ORDER — BETAMETHASONE SOD PHOS & ACET 6 (3-3) MG/ML IJ SUSP
12.0000 mg | Freq: Once | INTRAMUSCULAR | Status: AC
Start: 2023-07-11 — End: 2023-07-11
  Administered 2023-07-11: 12 mg via INTRA_ARTICULAR

## 2023-07-11 NOTE — Patient Instructions (Signed)
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

## 2023-07-11 NOTE — Progress Notes (Signed)
Shoulder injectionLeft  With  ultrasound guidance-[id number]  Indication:LEFT Shoulder pain not relieved by medication management and other conservative care.  Informed consent was obtained after describing risks and benefits of the procedure with the patient, this includes bleeding, bruising, infection and medication side effects. The patient wishes to proceed and has given written consent. Patient was placed in a seated position.THe left post subacromial area was scanned with 15hz  linear transducer The LEFT shoulder was marked and prepped with betadine in the subacromial area. A sterile cover was placed on the transducer and sterile [place] gel was used.  A 25-gauge 1-1/2 inch needle was inserted under long axis [place] view infiltrating 2ml in subcut tissue. Then a 22g 50mm ECHO BLOC needle was inserted under US guidance into the subacromial space, a solution containing 1 mL of 6 mg per ML betamethasone and 4 mL of 1% lidocaine was injected. A band aid was applied. The patient tolerated the procedure well. Post procedure instructions were given.    If duration of response is declining would consider suprascapular nerve block

## 2023-07-18 DIAGNOSIS — N184 Chronic kidney disease, stage 4 (severe): Secondary | ICD-10-CM | POA: Diagnosis not present

## 2023-07-18 DIAGNOSIS — Z1329 Encounter for screening for other suspected endocrine disorder: Secondary | ICD-10-CM | POA: Diagnosis not present

## 2023-07-18 DIAGNOSIS — E119 Type 2 diabetes mellitus without complications: Secondary | ICD-10-CM | POA: Diagnosis not present

## 2023-07-18 DIAGNOSIS — E7849 Other hyperlipidemia: Secondary | ICD-10-CM | POA: Diagnosis not present

## 2023-07-18 DIAGNOSIS — Z1321 Encounter for screening for nutritional disorder: Secondary | ICD-10-CM | POA: Diagnosis not present

## 2023-07-18 DIAGNOSIS — E1165 Type 2 diabetes mellitus with hyperglycemia: Secondary | ICD-10-CM | POA: Diagnosis not present

## 2023-07-18 DIAGNOSIS — Z0001 Encounter for general adult medical examination with abnormal findings: Secondary | ICD-10-CM | POA: Diagnosis not present

## 2023-07-22 DIAGNOSIS — E7849 Other hyperlipidemia: Secondary | ICD-10-CM | POA: Diagnosis not present

## 2023-07-22 DIAGNOSIS — E119 Type 2 diabetes mellitus without complications: Secondary | ICD-10-CM | POA: Diagnosis not present

## 2023-07-22 DIAGNOSIS — Z1321 Encounter for screening for nutritional disorder: Secondary | ICD-10-CM | POA: Diagnosis not present

## 2023-07-22 DIAGNOSIS — E1165 Type 2 diabetes mellitus with hyperglycemia: Secondary | ICD-10-CM | POA: Diagnosis not present

## 2023-07-22 DIAGNOSIS — Z0001 Encounter for general adult medical examination with abnormal findings: Secondary | ICD-10-CM | POA: Diagnosis not present

## 2023-07-22 DIAGNOSIS — N184 Chronic kidney disease, stage 4 (severe): Secondary | ICD-10-CM | POA: Diagnosis not present

## 2023-07-22 DIAGNOSIS — Z1329 Encounter for screening for other suspected endocrine disorder: Secondary | ICD-10-CM | POA: Diagnosis not present

## 2023-07-24 DIAGNOSIS — I25119 Atherosclerotic heart disease of native coronary artery with unspecified angina pectoris: Secondary | ICD-10-CM | POA: Diagnosis not present

## 2023-07-24 DIAGNOSIS — E440 Moderate protein-calorie malnutrition: Secondary | ICD-10-CM | POA: Diagnosis not present

## 2023-07-24 DIAGNOSIS — E8881 Metabolic syndrome: Secondary | ICD-10-CM | POA: Diagnosis not present

## 2023-07-24 DIAGNOSIS — N1831 Chronic kidney disease, stage 3a: Secondary | ICD-10-CM | POA: Diagnosis not present

## 2023-07-24 DIAGNOSIS — E7849 Other hyperlipidemia: Secondary | ICD-10-CM | POA: Diagnosis not present

## 2023-07-24 DIAGNOSIS — I639 Cerebral infarction, unspecified: Secondary | ICD-10-CM | POA: Diagnosis not present

## 2023-07-24 DIAGNOSIS — G8102 Flaccid hemiplegia affecting left dominant side: Secondary | ICD-10-CM | POA: Diagnosis not present

## 2023-07-24 DIAGNOSIS — Z0001 Encounter for general adult medical examination with abnormal findings: Secondary | ICD-10-CM | POA: Diagnosis not present

## 2023-07-24 DIAGNOSIS — I10 Essential (primary) hypertension: Secondary | ICD-10-CM | POA: Diagnosis not present

## 2023-07-24 DIAGNOSIS — J9611 Chronic respiratory failure with hypoxia: Secondary | ICD-10-CM | POA: Diagnosis not present

## 2023-07-29 DIAGNOSIS — Z0001 Encounter for general adult medical examination with abnormal findings: Secondary | ICD-10-CM | POA: Diagnosis not present

## 2023-07-29 DIAGNOSIS — Z6829 Body mass index (BMI) 29.0-29.9, adult: Secondary | ICD-10-CM | POA: Diagnosis not present

## 2023-07-31 DIAGNOSIS — N184 Chronic kidney disease, stage 4 (severe): Secondary | ICD-10-CM | POA: Diagnosis not present

## 2023-07-31 DIAGNOSIS — E119 Type 2 diabetes mellitus without complications: Secondary | ICD-10-CM | POA: Diagnosis not present

## 2023-08-01 DIAGNOSIS — N1831 Chronic kidney disease, stage 3a: Secondary | ICD-10-CM | POA: Diagnosis not present

## 2023-08-01 DIAGNOSIS — E119 Type 2 diabetes mellitus without complications: Secondary | ICD-10-CM | POA: Diagnosis not present

## 2023-08-04 ENCOUNTER — Encounter: Payer: Self-pay | Admitting: Cardiology

## 2023-08-04 ENCOUNTER — Ambulatory Visit: Payer: Medicare HMO | Attending: Cardiology | Admitting: Cardiology

## 2023-08-04 VITALS — BP 106/58 | HR 76 | Ht 71.0 in | Wt 207.4 lb

## 2023-08-04 DIAGNOSIS — I255 Ischemic cardiomyopathy: Secondary | ICD-10-CM

## 2023-08-04 DIAGNOSIS — I25119 Atherosclerotic heart disease of native coronary artery with unspecified angina pectoris: Secondary | ICD-10-CM | POA: Diagnosis not present

## 2023-08-04 DIAGNOSIS — E782 Mixed hyperlipidemia: Secondary | ICD-10-CM | POA: Diagnosis not present

## 2023-08-04 DIAGNOSIS — I5022 Chronic systolic (congestive) heart failure: Secondary | ICD-10-CM

## 2023-08-04 MED ORDER — CARVEDILOL 6.25 MG PO TABS: 6.2500 mg | ORAL_TABLET | Freq: Two times a day (BID) | ORAL | 6 refills | Status: DC

## 2023-08-04 NOTE — Patient Instructions (Signed)
Medication Instructions:   Stop Cardizem  Increase Coreg to 6.25mg  twice a day  Continue all other medications.     Labwork:  none  Testing/Procedures:  Your physician has requested that you have an echocardiogram. Echocardiography is a painless test that uses sound waves to create images of your heart. It provides your doctor with information about the size and shape of your heart and how well your heart's chambers and valves are working. This procedure takes approximately one hour. There are no restrictions for this procedure. Please do NOT wear cologne, perfume, aftershave, or lotions (deodorant is allowed). Please arrive 15 minutes prior to your appointment time.  Please note: We ask at that you not bring children with you during ultrasound (echo/ vascular) testing. Due to room size and safety concerns, children are not allowed in the ultrasound rooms during exams. Our front office staff cannot provide observation of children in our lobby area while testing is being conducted. An adult accompanying a patient to their appointment will only be allowed in the ultrasound room at the discretion of the ultrasound technician under special circumstances. We apologize for any inconvenience. Office will contact with results via phone, letter or mychart.     Follow-Up:  6 months   Any Other Special Instructions Will Be Listed Below (If Applicable).   If you need a refill on your cardiac medications before your next appointment, please call your pharmacy.

## 2023-08-04 NOTE — Progress Notes (Signed)
    Cardiology Office Note  Date: 08/04/2023   ID: [name], DOB 03/12/1966, MRN [id number]  History of Present Illness: [name] is a 58 y.o. male last seen in July 2024.  He is here for a routine visit.  Reports no angina.  States that he has had an intermittently productive cough and chest congestion, no obvious fevers or chills.  Scheduled to see his PCP this week.  He has chronic left foot and ankle swelling, continued left-sided hemiparesis following prior stroke, in a wheelchair as before.  I reviewed his medications.  Current cardiovascular regimen includes aspirin, Plavix, Lipitor, Coreg, Cardizem SR, Zetia, Lasix, Imdur, ProAmatine, and Ozempic.  We discussed adjustments today as noted below.  I reviewed his interval lab work.  Physical Exam: VS:  BP (!) 106/58   Pulse 76   Ht 5\' 11"  (1.803 m)   Wt 207 lb 6.4 oz (94.1 kg)   SpO2 95%   BMI 28.93 kg/m , BMI Body mass index is 28.93 kg/m.  Wt Readings from Last 3 Encounters:  08/04/23 207 lb 6.4 oz (94.1 kg)  05/27/23 203 lb 12.8 oz (92.4 kg)  04/18/23 188 lb (85.3 kg)    General: Patient appears comfortable at rest. HEENT: Conjunctiva and lids normal. Neck: Supple, no elevated JVP or carotid bruits. Lungs: Clear to auscultation, nonlabored breathing at rest. Cardiac: Regular rate and rhythm, no S3, 1/6 systolic murmur. Extremities: Mild left-sided lower leg edema..  ECG:  An ECG dated 09/27/2021 was personally reviewed today and demonstrated:  Sinus rhythm with old anteroseptal infarct pattern.  Labwork:    Component Value Date/Time   CHOL 150 01/31/2023 1617   TRIG 115 01/31/2023 1617   HDL 30 (L) 01/31/2023 1617   CHOLHDL 5.0 01/31/2023 1617   VLDL 23 01/31/2023 1617   LDLCALC 97 01/31/2023 1617   LDLDIRECT 103 (H) 07/31/2022 1551  July 2024: BUN 22, creatinine 1.15, potassium 4.5, AST 17, ALT 22  Other Studies Reviewed Today:  No interval cardiac testing for review  today.  Assessment and Plan:  1.  Multivessel CAD status post CABG in 2010 including LIMA to LAD, SVG to diagonal, and SVG to OM1 and OM 2.  No angina at low level activity.  He continues on aspirin, Lipitor, Ozempic, and Imdur.   2.  HFmrEF with ischemic cardiomyopathy, LVEF 40 to 45% by echocardiogram in February 2024.  Apical and anteroseptal akinesis noted without evidence of LV mural thrombus.  Plan to stop Cardizem and increase Coreg to 6.25 mg twice daily.  He is on ProAmatine for support of blood pressure, otherwise not able to titrate GDMT.  Update echocardiogram.  3.  Mixed hyperlipidemia.  LDL 97 in July 2024.  He continues on Lipitor and Zetia.   4.  History of stroke with left-sided residua.  Disposition:  Follow up  6 months.  Signed, Jonelle Sidle, M.D., F.A.C.C. [facility]

## 2023-08-05 DIAGNOSIS — N1832 Chronic kidney disease, stage 3b: Secondary | ICD-10-CM | POA: Diagnosis not present

## 2023-08-11 ENCOUNTER — Ambulatory Visit: Payer: Medicare HMO | Attending: Cardiology

## 2023-08-14 ENCOUNTER — Ambulatory Visit: Payer: Medicare HMO | Attending: Internal Medicine

## 2023-08-14 DIAGNOSIS — I255 Ischemic cardiomyopathy: Secondary | ICD-10-CM

## 2023-08-14 LAB — ECHOCARDIOGRAM COMPLETE
AR max vel: 1.86 cm2
AV Area VTI: 2.14 cm2
AV Area mean vel: 2.03 cm2
AV Mean grad: 4 mm[Hg]
AV Peak grad: 10.5 mm[Hg]
Ao pk vel: 1.62 m/s
Area-P 1/2: 3.81 cm2
Calc EF: 43.6 %
MV VTI: 1.97 cm2
P 1/2 time: 683 ms
S' Lateral: 3.9 cm
Single Plane A2C EF: 50.1 %
Single Plane A4C EF: 36.6 %

## 2023-08-14 MED ORDER — PERFLUTREN LIPID MICROSPHERE
1.0000 mL | INTRAVENOUS | Status: AC | PRN
Start: 2023-08-14 — End: 2023-08-14
  Administered 2023-08-14: 5 mL via INTRAVENOUS

## 2023-08-22 ENCOUNTER — Encounter: Payer: Self-pay | Admitting: Physical Medicine & Rehabilitation

## 2023-08-22 ENCOUNTER — Encounter: Payer: Medicare HMO | Attending: Physical Medicine & Rehabilitation | Admitting: Physical Medicine & Rehabilitation

## 2023-08-22 VITALS — Ht 71.0 in

## 2023-08-22 DIAGNOSIS — G8110 Spastic hemiplegia affecting unspecified side: Secondary | ICD-10-CM | POA: Diagnosis not present

## 2023-08-22 MED ORDER — ABOBOTULINUMTOXINA 500 UNITS IM SOLR
1000.0000 [IU] | Freq: Once | INTRAMUSCULAR | Status: AC
Start: 2023-08-22 — End: 2023-08-22
  Administered 2023-08-22: 1000 [IU] via INTRAMUSCULAR

## 2023-08-22 NOTE — Progress Notes (Signed)
 Dysport Injection for spasticity using needle EMG guidance  Dilution: 200 Units/ml Indication: Severe spasticity which interferes with ADL,mobility and/or  hygiene and is unresponsive to medication management and other conservative care Informed consent was obtained after describing risks and benefits of the procedure with the patient. This includes bleeding, bruising, infection, excessive weakness, or medication side effects. A REMS form is on file and signed. Needle:  needle electrode Number of units per muscle LUE- 500U total FDS 100-   MAS 0 FDP 100-   MAS 0  FCR 100   MAS 0 FPL 100    MAS 0  Left opponens pollicis 100 MAS 0   LLE 500U total   100U semimembranosus and 100U Semitendinosis MAS 3   Tibialis post  100U    med gastroc 100U Soleus  100U MAS 3 All injections were done after obtaining appropriate EMG activity and after negative drawback for blood. The patient tolerated the procedure well. Post procedure instructions were given. A followup appointment was made.

## 2023-08-22 NOTE — Addendum Note (Signed)
 Addended by: Zeola Brys M on: 08/22/2023 03:41 PM   Modules accepted: Orders

## 2023-08-23 DIAGNOSIS — I639 Cerebral infarction, unspecified: Secondary | ICD-10-CM | POA: Diagnosis not present

## 2023-08-29 ENCOUNTER — Other Ambulatory Visit: Payer: Self-pay | Admitting: Medical

## 2023-09-01 ENCOUNTER — Other Ambulatory Visit: Payer: Self-pay | Admitting: Medical

## 2023-09-02 DIAGNOSIS — L732 Hidradenitis suppurativa: Secondary | ICD-10-CM | POA: Diagnosis not present

## 2023-09-02 DIAGNOSIS — Z5181 Encounter for therapeutic drug level monitoring: Secondary | ICD-10-CM | POA: Diagnosis not present

## 2023-09-03 DIAGNOSIS — Z6826 Body mass index (BMI) 26.0-26.9, adult: Secondary | ICD-10-CM | POA: Diagnosis not present

## 2023-09-03 DIAGNOSIS — L03115 Cellulitis of right lower limb: Secondary | ICD-10-CM | POA: Diagnosis not present

## 2023-09-03 DIAGNOSIS — M25561 Pain in right knee: Secondary | ICD-10-CM | POA: Diagnosis not present

## 2023-09-22 DIAGNOSIS — I639 Cerebral infarction, unspecified: Secondary | ICD-10-CM | POA: Diagnosis not present

## 2023-10-07 ENCOUNTER — Encounter: Payer: Medicare HMO | Admitting: Physical Medicine & Rehabilitation

## 2023-10-17 ENCOUNTER — Ambulatory Visit: Payer: Medicare HMO | Admitting: Neurology

## 2023-10-18 DIAGNOSIS — I110 Hypertensive heart disease with heart failure: Secondary | ICD-10-CM | POA: Diagnosis not present

## 2023-10-18 DIAGNOSIS — I509 Heart failure, unspecified: Secondary | ICD-10-CM | POA: Diagnosis not present

## 2023-10-18 DIAGNOSIS — T24232A Burn of second degree of left lower leg, initial encounter: Secondary | ICD-10-CM | POA: Diagnosis not present

## 2023-10-18 DIAGNOSIS — L03116 Cellulitis of left lower limb: Secondary | ICD-10-CM | POA: Diagnosis not present

## 2023-10-18 DIAGNOSIS — Z720 Tobacco use: Secondary | ICD-10-CM | POA: Diagnosis not present

## 2023-10-18 DIAGNOSIS — J449 Chronic obstructive pulmonary disease, unspecified: Secondary | ICD-10-CM | POA: Diagnosis not present

## 2023-10-18 DIAGNOSIS — G629 Polyneuropathy, unspecified: Secondary | ICD-10-CM | POA: Diagnosis not present

## 2023-10-18 DIAGNOSIS — F321 Major depressive disorder, single episode, moderate: Secondary | ICD-10-CM | POA: Diagnosis not present

## 2023-10-18 DIAGNOSIS — E1159 Type 2 diabetes mellitus with other circulatory complications: Secondary | ICD-10-CM | POA: Diagnosis not present

## 2023-10-18 DIAGNOSIS — M7732 Calcaneal spur, left foot: Secondary | ICD-10-CM | POA: Diagnosis not present

## 2023-10-18 DIAGNOSIS — E1140 Type 2 diabetes mellitus with diabetic neuropathy, unspecified: Secondary | ICD-10-CM | POA: Diagnosis not present

## 2023-10-18 DIAGNOSIS — M19072 Primary osteoarthritis, left ankle and foot: Secondary | ICD-10-CM | POA: Diagnosis not present

## 2023-10-19 DIAGNOSIS — M19072 Primary osteoarthritis, left ankle and foot: Secondary | ICD-10-CM | POA: Diagnosis not present

## 2023-10-19 DIAGNOSIS — M7732 Calcaneal spur, left foot: Secondary | ICD-10-CM | POA: Diagnosis not present

## 2023-10-20 NOTE — Progress Notes (Unsigned)
 NEUROLOGY FOLLOW UP OFFICE NOTE  DIOR STEPTER [id number]  Assessment/Plan:   Two episodes of left sided facial numbness and speech difficulty in setting of occasional palpitations.  Concern for TIA vs CVA due to cardioembolic etiology such as atrial fibrillation.  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  Secondary stroke prevention as managed by PCP/cardiology:  - ASA and Plavix   - Statin/Zetia.  LDL goal less than 70  - Hgb A1c goal less than 7  - Normotensive blood pressure 3.  To treat diabetic neuropathy/neuralgia:  increase Lyrica to 150mg  twice daily 4.  Follow up 6 months.   Subjective:  [name] is a 58 year old right-handed man with hypertension, type 2 diabetes mellitus, CAD, ischemic cardiomyopathy, COPD, tobacco use disorder, depression, prostate cancer and history of prior strokes who follows up for migraines.  History supplemented by his accompanying niece.     UPDATE: Current medications:  ASA 81mg , Plavix 75mg , atorvastatin 80mg , Zetia, Coreg, Imdur,  alprazolam 1mg  PRN, oxycodone, naloxone, baclofen, Cymbalta 60mg  twice daily, Lyrica 100mg  BID, baclofen  Had another occipital nerve block in October.  ***  In October, he endorsed a couple of episodes of left sided facial numbness.  He also noted occasional palpitations.  MRI of brain without contrast on 04/18/2023 personally reviewed revealed stable extensive chronic small vessel ischemicc changes with old infarcts in the right thalamus, posterior basal ganglia and radiating white matter tracts as well as old right posterior frontal cortical and subcortical  infarcts and smaller chronic infarcts in the left thalamus and basal ganglia but no new or acute findings.  Cardiac event monitor was ordered but ***.  He has since followed up with his cardiologist.  Echo on 08/14/2023 showed EF 40-45%.   HISTORY: He was admitted to [facility] on 06/14/17 with sudden onset left sided weakness and numbness and difficulty speaking.  He initially presented to [facility] where he received tPA with NIHSS of 15 and was then transferred to [facility].    CT of head from [facility] reportedly showed "acute infarction involving portions of the posterior right basal ganglia, right corona radiata and body of the right caudate nucleus".  MRI of brain at [facility] confirmed acute right basal ganglia infarct as well as remote right frontal infarct.  MRA of head and carotid doppler revealed no significant intracranial or extracranial arterial stenosis or occlusion.  He was unable to have CTA due to allergy to iodinated contrast.    Echocardiogram with bubble study showed EF of 25-30% with no evidence of PFO or ASD.  He was evaluated by cardiology for ischemic cardiomyopathy.  Lexiscan nuclear perfusion study revealed EF 18% with large infarct involving the apex and periapical anterior wall with no evidence of ischemia.  He was advised to follow up with outpatient cardiology.  LDL was 123 and triglycerides were 237.  Hgb A1c was 5.7.  He was already taking ASA 325mg  but not daily, as well as Plavix.  He is continued on ASA and Plavix.  06/30/2019 Echocardiogram:  LV EF 40-50%   He was seen in the ED at [facility] on 09/22/17 for headache.  CT of head showed no acute findings but was read as demonstrating  a chronic appearing infarct in the right posterior limb of internal capsule that was not present on prior CT from 06/14/17.  He also complained of left leg pain.  He reportedly had an elevated d dimer.  CT chest and venous doppler of lower extremity were  negative for DVT.  However, the started him on Xarelto.  He was told to stop Plavix by his PCP.  I contacted his PCP, Dr. Reuel Boom, who reviewed the ED notes and is also unsure why they started Xarelto when imaging was negative for DVT.  He was advised to discontinue Xarelto and restart Plavix with aspirin.   Headaches returned in December 2020.  Right sided 10/10 throbbing/pressure pain in the temple radiating to back of head.  Associated nausea, blurred vision, photophobia.  They last 2 hours and occur 2 to 3 days a week.  He reports right occipital/suboccipital numbness and tingling off and on.  He also notes that his right hand twitches about every other day, lasting 20-30 minutes.  It may occur while holding a cup but also at rest.  Due to worsening headache, MRI of brain without contrast was performed on 09/13/2019, which was personally reviewed, and demonstrated stable advanced chronic small vessel ischemic changes but no acute intracranial abnormality. Due to right hand myoclonus, EEG was performed on 08/17/2019, which was normal.  MRI of cervical spine on 08/08/2020 showed mild cervical spondylosis with mild spinal stenosis and mild to moderate neural foraminal stenosis at C3-4 and C4-5.  In April 2023, he was hospitalized at [facility] for altered mental status and acute respiratory failure.  Unsure if he was having a stroke or possibly status epilepticus.  He had significant hypotension.  Unable to get an MRI due to body habitus.  Serial head CTs revealed remote right hemispheric encephalomalacia but no acute findings.  CTA head and neck revealed atherosclerosis in both carotid bifurcations and flow lmiting stenosis of the left ICA at the distal bulb but on LVO or significant intracranial stenosis.  Long term video EEG monitoring revealed generalized slowing but no epileptiform discharges or electrographic seizures.    He had neuropsychological testing performed on 03/31/18, which demonstrated vascular cognitive  impairment affecting psychomotor processing speed, attention/working memory, and executive functioning.  He did go to PT and speech therapy.     He sees Dr. Wynn Banker for Dysport injection to treat spasticity.  He reports panic attacks.  He sees Dr. Evelene Croon for depression and anxiety.   Past medications:  gabapentin 600mg  BID    PAST MEDICAL HISTORY: Past Medical History:  Diagnosis Date   Anaphylaxis    IGE mediated   Anxiety    Arthritis    Cancer (HCC)    Prostate   Cardiomyopathy (HCC)    CHF (congestive heart failure) (HCC)    COPD (chronic obstructive pulmonary disease) (HCC)    Coronary atherosclerosis of native coronary artery    Multivessel status post CABG 2010   Depression    Essential hypertension    History of CVA (cerebrovascular accident) 01/2012   Right posterior frontal cortical and subcortical brain by MRI, no hemorrhage. Carotid Dopplers showed only 1-50% bilateral ICA stenoses. Echocardiogram showed LVEF 50%, no major valvular abnormalities.   History of kidney stones    Mixed hyperlipidemia    Myocardial infarction (HCC) 2010   OSA (obstructive sleep apnea)    Pneumonia    Prostate cancer (HCC) 12/2020   Dr. Berneice Heinrich at [facility]   Stroke [facility])    Type 2  diabetes mellitus (HCC)     MEDICATIONS: Current Outpatient Medications on File Prior to Visit  Medication Sig Dispense Refill   acetaminophen (TYLENOL) 500 MG tablet Take 1,000 mg by mouth every 6 (six) hours as needed for moderate pain or headache.     albuterol (PROVENTIL HFA;VENTOLIN HFA) 108 (90 BASE) MCG/ACT inhaler Inhale 2 puffs into the lungs every 6 (six) hours as needed for wheezing or shortness of breath.     ALPRAZolam (XANAX) 1 MG tablet Take 1 mg by mouth 2 (two) times daily as needed.     aspirin EC 81 MG tablet Take 1 tablet (81 mg total) by mouth daily. 90 tablet 3   atorvastatin (LIPITOR) 80 MG tablet Take 1 tablet (80 mg total) by mouth daily. 90 tablet 3   baclofen (LIORESAL) 20  MG tablet Take by mouth.     bisacodyl (DULCOLAX) 5 MG EC tablet Take by mouth.     carvedilol (COREG) 6.25 MG tablet Take 1 tablet (6.25 mg total) by mouth 2 (two) times daily. 60 tablet 6   Cholecalciferol (VITAMIN D) 50 MCG (2000 UT) CAPS 1 capsule     clopidogrel (PLAVIX) 75 MG tablet [id number]  Plavix 75 mg tablet 75 milligrams PO once a day for ? CVA; 1 tab po daily     COSENTYX UNOREADY 300 MG/2ML SOAJ Inject into the skin.     dapsone 25 MG tablet Take by mouth.     dibucaine (NUPERCAINAL) 1 % OINT 1 application as needed     DULoxetine (CYMBALTA) 60 MG capsule Take 1 capsule (60 mg total) by mouth daily. (Patient taking differently: Take 60 mg by mouth 2 (two) times daily.) 90 capsule 0   ezetimibe (ZETIA) 10 MG tablet Take 1 tablet (10 mg total) by mouth daily. 90 tablet 3   furosemide (LASIX) 20 MG tablet Take 20 mg by mouth.     isosorbide mononitrate (IMDUR) 30 MG 24 hr tablet 1 tablet in the morning     isosorbide mononitrate (IMDUR) 60 MG 24 hr tablet Take 60 mg by mouth 2 (two) times daily.      midodrine (PROAMATINE) 10 MG tablet Take 10 mg by mouth 2 (two) times daily.     naloxone (NARCAN) nasal spray 4 mg/0.1 mL Place 1 spray into the nose as needed (opioid overdose).     nitroGLYCERIN (NITROSTAT) 0.4 MG SL tablet 1 tablet as directed     nystatin (MYCOSTATIN) 100000 UNIT/ML suspension Take by mouth.     oxyCODONE-acetaminophen (PERCOCET) 10-325 MG tablet Take 1 tablet by mouth 3 (three) times daily as needed.     OZEMPIC, 1 MG/DOSE, 4 MG/3ML SOPN Inject 1 mg into the skin once a week.     polyethylene glycol powder (GLYCOLAX/MIRALAX) 17 GM/SCOOP powder Take 17 g by mouth daily.     pregabalin (LYRICA) 150 MG capsule Take 1 capsule (150 mg total) by mouth 2 (two) times daily. 60 capsule 5   Current Facility-Administered Medications on File Prior to Visit  Medication Dose Route Frequency Provider Last Rate Last Admin   AbobotulinumtoxinA (DYSPORT) 500 units injection 1,000  Units  1,000 Units Intramuscular Once        betamethasone acetate-betamethasone sodium phosphate (CELESTONE) injection 12 mg  12 mg Intramuscular Once Kirsteins, Victorino Sparrow, MD       lidocaine (XYLOCAINE) 1 % (with pres) injection 4 mL  4 mL Other Once Kirsteins, Victorino Sparrow, MD       sodium chloride (PF)  0.9 % injection 2.5 mL  2.5 mL Other Once         ALLERGIES: Allergies  Allergen Reactions   Ibuprofen Anaphylaxis, Hives and Swelling   Iodinated Contrast Media Anaphylaxis, Itching, Rash, Shortness Of Breath and Swelling    Isovue contrast is most acceptable agent based on previous experience and testing with premedications  Other reaction(s): throat swells up  Other reaction(s): Other  Other Reaction(s): throat swells up   Nsaids Anaphylaxis, Hives, Swelling and Rash    Can take Aspirin 325 mg or lower Other reaction(s): rash Other reaction(s): rash   Ace Inhibitors     Other reaction(s): Unknown  Other Reaction(s): Unknown   Chantix [Varenicline]     Nightmares    Other Other (See Comments)    Anti-physcotics  Personality change  Other Reaction(s): throat swells up   Pollen Extract     Other reaction(s): Unknown Other reaction(s): Unknown    FAMILY HISTORY: Family History  Problem Relation Age of Onset   Lung cancer Father    Heart disease Father    Heart disease Mother    Congestive Heart Failure Mother    Diabetes Mother    Hyperlipidemia Mother    Alcohol abuse Brother    Breast cancer Maternal Aunt    Suicidality Cousin       Objective:  *** General: No acute distress.  Patient appears well-groomed.   Head:  Normocephalic/atraumatic Eyes:  Fundi examined but not visualized Neck: supple, no paraspinal tenderness, full range of motion Heart:  Regular rate and rhythm Neurological Exam: alert and oriented.  Speech fluent and not dysarthric, language intact.  Decreased left V2.  Otherwise, CN II-XII intact. Tone flaccid in left upper extremity.  Muscle  strength plegic in left upper extremity except 2+/5 hand grip, 2+/5 left hip flexion, plegic left knee flexion/extension, left foot drop, 5/5 right upper and lower extremities.  Sensation to light touch reduced in left upper and lower extremities.  DTR 3+ left upper and lower extremities, 2+ right upper and lower extremities.  Finger to nose intact on right, unable to assess left; Gait deferred. ***   Shon Millet, DO  CC: Donzetta Sprung, MD

## 2023-10-21 ENCOUNTER — Ambulatory Visit: Payer: Medicare HMO | Admitting: Neurology

## 2023-10-21 ENCOUNTER — Encounter: Payer: Self-pay | Admitting: Neurology

## 2023-10-21 VITALS — BP 90/54 | Ht 71.0 in | Wt 207.0 lb

## 2023-10-21 DIAGNOSIS — G252 Other specified forms of tremor: Secondary | ICD-10-CM | POA: Diagnosis not present

## 2023-10-21 DIAGNOSIS — M5481 Occipital neuralgia: Secondary | ICD-10-CM | POA: Diagnosis not present

## 2023-10-21 DIAGNOSIS — I69354 Hemiplegia and hemiparesis following cerebral infarction affecting left non-dominant side: Secondary | ICD-10-CM

## 2023-10-21 DIAGNOSIS — E118 Type 2 diabetes mellitus with unspecified complications: Secondary | ICD-10-CM | POA: Diagnosis not present

## 2023-10-21 DIAGNOSIS — Z794 Long term (current) use of insulin: Secondary | ICD-10-CM

## 2023-10-21 DIAGNOSIS — G629 Polyneuropathy, unspecified: Secondary | ICD-10-CM

## 2023-10-21 DIAGNOSIS — R200 Anesthesia of skin: Secondary | ICD-10-CM

## 2023-10-21 DIAGNOSIS — Z8673 Personal history of transient ischemic attack (TIA), and cerebral infarction without residual deficits: Secondary | ICD-10-CM

## 2023-10-21 MED ORDER — PREGABALIN 200 MG PO CAPS: 200.0000 mg | ORAL_CAPSULE | Freq: Two times a day (BID) | ORAL | 5 refills | Status: AC

## 2023-10-21 NOTE — Patient Instructions (Signed)
 Increase pregablin to 200mg  twice daily.  If no improvement in 2 months, contact me MRA of head Bilateral carotid ultrasound Follow up 6 months.

## 2023-10-22 DIAGNOSIS — I639 Cerebral infarction, unspecified: Secondary | ICD-10-CM | POA: Diagnosis not present

## 2023-10-28 ENCOUNTER — Encounter: Payer: Self-pay | Admitting: Physical Medicine & Rehabilitation

## 2023-10-28 ENCOUNTER — Encounter: Attending: Physical Medicine & Rehabilitation | Admitting: Physical Medicine & Rehabilitation

## 2023-10-28 VITALS — BP 108/75 | HR 66 | Ht 71.0 in | Wt 213.0 lb

## 2023-10-28 DIAGNOSIS — M25512 Pain in left shoulder: Secondary | ICD-10-CM | POA: Insufficient documentation

## 2023-10-28 DIAGNOSIS — G8929 Other chronic pain: Secondary | ICD-10-CM | POA: Insufficient documentation

## 2023-10-28 MED ORDER — LIDOCAINE HCL 1 % IJ SOLN
5.0000 mL | Freq: Once | INTRAMUSCULAR | Status: AC
Start: 2023-10-28 — End: 2023-10-28
  Administered 2023-10-28: 5 mL

## 2023-10-28 MED ORDER — BETAMETHASONE SOD PHOS & ACET 6 (3-3) MG/ML IJ SUSP
6.0000 mg | Freq: Once | INTRAMUSCULAR | Status: AC
Start: 2023-10-28 — End: 2023-10-28
  Administered 2023-10-28: 6 mg via INTRAMUSCULAR

## 2023-10-28 NOTE — Progress Notes (Signed)
 LEFT SUPRASCAPULAR NERVE BLOCK Indication chronic shoulder pain that is not responsive to medication management and other conservative care  Pain is severe and interferes with activities  Informed consent was obtained after describing risks and benefits of the procedure including bleeding bruising and infection. She elects to proceed and has given written consent. Patient placed in a seated position medial to lateral approach utilized. Linear transducer placed in oblique coronal plane. Suprascapular notch identified. Needle inserted in plane medial to lateral. Once target was reached with needle tip, a solution containing one ML 6 ML per cc betamethasone and 4 mL 0.25% Marcaine were injected. Patient tolerated procedure well. Post procedure instructions given

## 2023-10-30 ENCOUNTER — Ambulatory Visit: Attending: Neurology

## 2023-10-30 DIAGNOSIS — I2510 Atherosclerotic heart disease of native coronary artery without angina pectoris: Secondary | ICD-10-CM | POA: Diagnosis not present

## 2023-10-30 DIAGNOSIS — Z8673 Personal history of transient ischemic attack (TIA), and cerebral infarction without residual deficits: Secondary | ICD-10-CM

## 2023-10-30 DIAGNOSIS — R200 Anesthesia of skin: Secondary | ICD-10-CM

## 2023-11-01 ENCOUNTER — Ambulatory Visit (HOSPITAL_COMMUNITY)
Admission: RE | Admit: 2023-11-01 | Discharge: 2023-11-01 | Disposition: A | Discharge status: Home / Self Care | Source: Ambulatory Visit | Attending: Neurology | Admitting: Neurology

## 2023-11-01 DIAGNOSIS — Z8673 Personal history of transient ischemic attack (TIA), and cerebral infarction without residual deficits: Secondary | ICD-10-CM | POA: Diagnosis not present

## 2023-11-01 DIAGNOSIS — R200 Anesthesia of skin: Secondary | ICD-10-CM | POA: Diagnosis not present

## 2023-11-01 DIAGNOSIS — I7789 Other specified disorders of arteries and arterioles: Secondary | ICD-10-CM | POA: Insufficient documentation

## 2023-11-01 DIAGNOSIS — I639 Cerebral infarction, unspecified: Secondary | ICD-10-CM | POA: Diagnosis not present

## 2023-11-18 ENCOUNTER — Encounter: Admitting: Physical Medicine & Rehabilitation

## 2023-11-20 DIAGNOSIS — Z0001 Encounter for general adult medical examination with abnormal findings: Secondary | ICD-10-CM | POA: Diagnosis not present

## 2023-11-20 DIAGNOSIS — N184 Chronic kidney disease, stage 4 (severe): Secondary | ICD-10-CM | POA: Diagnosis not present

## 2023-11-20 DIAGNOSIS — E119 Type 2 diabetes mellitus without complications: Secondary | ICD-10-CM | POA: Diagnosis not present

## 2023-11-20 DIAGNOSIS — R634 Abnormal weight loss: Secondary | ICD-10-CM | POA: Diagnosis not present

## 2023-11-20 DIAGNOSIS — Z13220 Encounter for screening for lipoid disorders: Secondary | ICD-10-CM | POA: Diagnosis not present

## 2023-11-24 DIAGNOSIS — I639 Cerebral infarction, unspecified: Secondary | ICD-10-CM | POA: Diagnosis not present

## 2023-11-26 ENCOUNTER — Ambulatory Visit: Payer: Self-pay | Admitting: Neurology

## 2023-11-26 NOTE — Progress Notes (Signed)
 LMOVM to call the office [place].

## 2023-11-27 ENCOUNTER — Other Ambulatory Visit: Payer: Self-pay

## 2023-11-27 ENCOUNTER — Telehealth: Payer: Self-pay | Admitting: Physical Medicine & Rehabilitation

## 2023-11-27 DIAGNOSIS — Z112 Encounter for screening for other bacterial diseases: Secondary | ICD-10-CM | POA: Diagnosis not present

## 2023-11-27 DIAGNOSIS — F17210 Nicotine dependence, cigarettes, uncomplicated: Secondary | ICD-10-CM | POA: Diagnosis not present

## 2023-11-27 DIAGNOSIS — I25119 Atherosclerotic heart disease of native coronary artery with unspecified angina pectoris: Secondary | ICD-10-CM | POA: Diagnosis not present

## 2023-11-27 DIAGNOSIS — M21372 Foot drop, left foot: Secondary | ICD-10-CM | POA: Diagnosis not present

## 2023-11-27 DIAGNOSIS — J029 Acute pharyngitis, unspecified: Secondary | ICD-10-CM | POA: Diagnosis not present

## 2023-11-27 DIAGNOSIS — I10 Essential (primary) hypertension: Secondary | ICD-10-CM | POA: Diagnosis not present

## 2023-11-27 DIAGNOSIS — R519 Headache, unspecified: Secondary | ICD-10-CM

## 2023-11-27 DIAGNOSIS — M542 Cervicalgia: Secondary | ICD-10-CM

## 2023-11-27 NOTE — Telephone Encounter (Signed)
 Patient called in states Dr Festus Hubert is recommending we do a occipital nerve block for patient

## 2023-12-16 DIAGNOSIS — R21 Rash and other nonspecific skin eruption: Secondary | ICD-10-CM | POA: Diagnosis not present

## 2023-12-16 DIAGNOSIS — M25512 Pain in left shoulder: Secondary | ICD-10-CM | POA: Diagnosis not present

## 2023-12-16 DIAGNOSIS — Z6828 Body mass index (BMI) 28.0-28.9, adult: Secondary | ICD-10-CM | POA: Diagnosis not present

## 2023-12-18 ENCOUNTER — Encounter: Payer: Self-pay | Admitting: Physical Medicine & Rehabilitation

## 2023-12-18 ENCOUNTER — Encounter: Attending: Physical Medicine & Rehabilitation | Admitting: Physical Medicine & Rehabilitation

## 2023-12-18 VITALS — BP 82/52 | HR 66 | Ht 71.0 in | Wt 206.0 lb

## 2023-12-18 DIAGNOSIS — G8110 Spastic hemiplegia affecting unspecified side: Secondary | ICD-10-CM | POA: Diagnosis present

## 2023-12-18 MED ORDER — ABOBOTULINUMTOXINA 500 UNITS IM SOLR
1000.0000 [IU] | Freq: Once | INTRAMUSCULAR | Status: AC
Start: 2023-12-18 — End: 2023-12-18
  Administered 2023-12-18: 1000 [IU] via INTRAMUSCULAR

## 2023-12-18 MED ORDER — SODIUM CHLORIDE (PF) 0.9 % IJ SOLN
5.0000 mL | Freq: Once | INTRAMUSCULAR | Status: AC
Start: 2023-12-18 — End: 2023-12-18
  Administered 2023-12-18: 5 mL

## 2023-12-18 NOTE — Progress Notes (Signed)
 Dysport Injection for spasticity using needle EMG guidance  Dilution: 200 Units/ml Indication: Severe spasticity which interferes with ADL,mobility and/or  hygiene and is unresponsive to medication management and other conservative care Informed consent was obtained after describing risks and benefits of the procedure with the patient. This includes bleeding, bruising, infection, excessive weakness, or medication side effects. A REMS form is on file and signed. Needle:  needle electrode Number of units per muscle LUE- 500U total FDS 100-   MAS 0 FDP 100-   MAS 0  FCR 100   MAS 0 FPL 100    MAS 0  Left opponens pollicis 100 MAS 0   LLE 500U total   100U semimembranosus and 100U Semitendinosis MAS 3   Tibialis post  100U    med gastroc 100U Soleus  100U MAS 3 All injections were done after obtaining appropriate EMG activity and after negative drawback for blood. The patient tolerated the procedure well. Post procedure instructions were given. A followup appointment was made.

## 2024-01-09 ENCOUNTER — Encounter: Admitting: Physical Medicine & Rehabilitation

## 2024-01-20 DIAGNOSIS — Z6829 Body mass index (BMI) 29.0-29.9, adult: Secondary | ICD-10-CM | POA: Diagnosis not present

## 2024-01-20 DIAGNOSIS — R300 Dysuria: Secondary | ICD-10-CM | POA: Diagnosis not present

## 2024-01-29 ENCOUNTER — Encounter: Attending: Physical Medicine & Rehabilitation | Admitting: Physical Medicine & Rehabilitation

## 2024-01-29 ENCOUNTER — Encounter: Payer: Self-pay | Admitting: Physical Medicine & Rehabilitation

## 2024-01-29 VITALS — BP 104/59 | HR 71

## 2024-01-29 DIAGNOSIS — M5481 Occipital neuralgia: Secondary | ICD-10-CM | POA: Insufficient documentation

## 2024-01-29 MED ORDER — BETAMETHASONE SOD PHOS & ACET 6 (3-3) MG/ML IJ SUSP
6.0000 mg | Freq: Once | INTRAMUSCULAR | Status: AC
Start: 2024-01-29 — End: 2024-01-29
  Administered 2024-01-29: 6 mg

## 2024-01-29 MED ORDER — LIDOCAINE HCL 1 % IJ SOLN
5.0000 mL | Freq: Once | INTRAMUSCULAR | Status: AC
Start: 2024-01-29 — End: 2024-01-29
  Administered 2024-01-29: 5 mL

## 2024-01-29 NOTE — Patient Instructions (Signed)
 Occipital Nerve Block Patient Information  Description: The occipital nerves originate in the cervical (neck) spinal cord and travel upward through muscle and tissue to supply sensation to the back of the head and top of the scalp.  In addition, the nerves control some of the muscles of the scalp.  Occipital neuralgia is an irritation of these nerves which can cause headaches, numbness of the scalp, and neck discomfort.     The occipital nerve block will interrupt nerve transmission through these nerves and can relieve pain and spasm.  The block consists of insertion of a small needle under the skin in the back of the head to deposit local anesthetic (numbing medicine) and/or steroids around the nerve.  The entire block usually lasts less than 5 minutes.  Conditions which may be treated by occipital blocks:  Muscular pain and spasm of the scalp Nerve irritation, back of the head Headaches Upper neck pain   Possible side-effects:  Bleeding from needle site Infection (rare, may require surgery) Nerve injury (rare) Hair on back of neck can be tinged with iodine  scrub (this will wash out) Light-headedness (temporary) Pain at injection site (several days) Decreased blood pressure (rare, temporary) Seizure (very rare)  Call if you experience:  Hives or difficulty breathing ( go to the emergency room) Inflammation or drainage at the injection site(s)  Please note:  Although the local anesthetic injected can often make your painful muscles or headache feel good for several hours after the injection, the pain may return.  It takes 3-7 days for steroids to work.  You may not notice any pain relief for at least one week.  If effective, we will often do a series of injections spaced 3-6 weeks apart to maximally decrease your pain.  If you have any questions, please call [phone]

## 2024-01-29 NOTE — Progress Notes (Signed)
 Right  occipital nerve block Indication right occipital neuralgia Pain is only partially response to medication management of the conservative care Occipital protuberance palpated prepped with Betadine  and entered with a 30-gauge 1/2 inch needle, 1/2 ml of 1% lidocaine  injected.Then with a 25 gauge 1.5 inch needle a solution of 1cc of celestone  6 mg per cc +2 cc of 1% lidocaine  injected in a fan-like pattern. Patient tolerated procedure well

## 2024-02-06 ENCOUNTER — Other Ambulatory Visit: Payer: Self-pay | Admitting: Cardiology

## 2024-02-07 DIAGNOSIS — R300 Dysuria: Secondary | ICD-10-CM | POA: Diagnosis not present

## 2024-02-24 DIAGNOSIS — Z6830 Body mass index (BMI) 30.0-30.9, adult: Secondary | ICD-10-CM | POA: Diagnosis not present

## 2024-02-24 DIAGNOSIS — I25119 Atherosclerotic heart disease of native coronary artery with unspecified angina pectoris: Secondary | ICD-10-CM | POA: Diagnosis not present

## 2024-02-24 DIAGNOSIS — E7849 Other hyperlipidemia: Secondary | ICD-10-CM | POA: Diagnosis not present

## 2024-02-24 DIAGNOSIS — R300 Dysuria: Secondary | ICD-10-CM | POA: Diagnosis not present

## 2024-02-24 DIAGNOSIS — E1122 Type 2 diabetes mellitus with diabetic chronic kidney disease: Secondary | ICD-10-CM | POA: Diagnosis not present

## 2024-02-24 DIAGNOSIS — I10 Essential (primary) hypertension: Secondary | ICD-10-CM | POA: Diagnosis not present

## 2024-02-24 DIAGNOSIS — G8102 Flaccid hemiplegia affecting left dominant side: Secondary | ICD-10-CM | POA: Diagnosis not present

## 2024-02-24 DIAGNOSIS — M21372 Foot drop, left foot: Secondary | ICD-10-CM | POA: Diagnosis not present

## 2024-02-24 DIAGNOSIS — F17210 Nicotine dependence, cigarettes, uncomplicated: Secondary | ICD-10-CM | POA: Diagnosis not present

## 2024-03-01 ENCOUNTER — Other Ambulatory Visit: Payer: Self-pay

## 2024-03-01 ENCOUNTER — Ambulatory Visit (HOSPITAL_COMMUNITY): Attending: Family Medicine | Admitting: Physical Therapy

## 2024-03-01 DIAGNOSIS — S81802S Unspecified open wound, left lower leg, sequela: Secondary | ICD-10-CM | POA: Diagnosis present

## 2024-03-01 DIAGNOSIS — M79662 Pain in left lower leg: Secondary | ICD-10-CM | POA: Diagnosis present
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
## 2024-03-02 NOTE — Telephone Encounter (Signed)
 Last Office Visit2/25 Next Office Visit [phone] Refilled per protocol [name], CMA

## 2024-03-04 ENCOUNTER — Ambulatory Visit (HOSPITAL_COMMUNITY): Admitting: Physical Therapy

## 2024-03-04 DIAGNOSIS — M79662 Pain in left lower leg: Secondary | ICD-10-CM

## 2024-03-04 DIAGNOSIS — S81802S Unspecified open wound, left lower leg, sequela: Secondary | ICD-10-CM
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
## 2024-03-05 DIAGNOSIS — I25119 Atherosclerotic heart disease of native coronary artery with unspecified angina pectoris: Secondary | ICD-10-CM | POA: Diagnosis not present

## 2024-03-05 DIAGNOSIS — J9611 Chronic respiratory failure with hypoxia: Secondary | ICD-10-CM | POA: Diagnosis not present

## 2024-03-05 DIAGNOSIS — E1122 Type 2 diabetes mellitus with diabetic chronic kidney disease: Secondary | ICD-10-CM | POA: Diagnosis not present

## 2024-03-05 DIAGNOSIS — I130 Hypertensive heart and chronic kidney disease with heart failure and stage 1 through stage 4 chronic kidney disease, or unspecified chronic kidney disease: Secondary | ICD-10-CM | POA: Diagnosis not present

## 2024-03-05 DIAGNOSIS — M1990 Unspecified osteoarthritis, unspecified site: Secondary | ICD-10-CM | POA: Diagnosis not present

## 2024-03-05 DIAGNOSIS — I5040 Unspecified combined systolic (congestive) and diastolic (congestive) heart failure: Secondary | ICD-10-CM | POA: Diagnosis not present

## 2024-03-05 DIAGNOSIS — M21372 Foot drop, left foot: Secondary | ICD-10-CM | POA: Diagnosis not present

## 2024-03-05 DIAGNOSIS — J449 Chronic obstructive pulmonary disease, unspecified: Secondary | ICD-10-CM | POA: Diagnosis not present

## 2024-03-05 DIAGNOSIS — F331 Major depressive disorder, recurrent, moderate: Secondary | ICD-10-CM | POA: Diagnosis not present

## 2024-03-05 DIAGNOSIS — G4733 Obstructive sleep apnea (adult) (pediatric): Secondary | ICD-10-CM | POA: Diagnosis not present

## 2024-03-05 DIAGNOSIS — I69352 Hemiplegia and hemiparesis following cerebral infarction affecting left dominant side: Secondary | ICD-10-CM | POA: Diagnosis not present

## 2024-03-05 DIAGNOSIS — N1831 Chronic kidney disease, stage 3a: Secondary | ICD-10-CM | POA: Diagnosis not present

## 2024-03-05 DIAGNOSIS — F17210 Nicotine dependence, cigarettes, uncomplicated: Secondary | ICD-10-CM | POA: Diagnosis not present

## 2024-03-07 ENCOUNTER — Other Ambulatory Visit: Payer: Self-pay | Admitting: Cardiology

## 2024-03-09 ENCOUNTER — Ambulatory Visit (HOSPITAL_COMMUNITY): Admitting: Physical Therapy

## 2024-03-09 ENCOUNTER — Encounter (HOSPITAL_COMMUNITY): Payer: Self-pay | Admitting: *Deleted

## 2024-03-09 ENCOUNTER — Observation Stay (HOSPITAL_COMMUNITY)
Admission: EM | Admit: 2024-03-09 | Discharge: 2024-03-10 | Disposition: A | Discharge status: Home / Self Care | Source: Ambulatory Visit | Attending: Internal Medicine | Admitting: Internal Medicine

## 2024-03-09 ENCOUNTER — Other Ambulatory Visit: Payer: Self-pay

## 2024-03-09 ENCOUNTER — Emergency Department (HOSPITAL_COMMUNITY)

## 2024-03-09 DIAGNOSIS — M21372 Foot drop, left foot: Secondary | ICD-10-CM | POA: Diagnosis not present

## 2024-03-09 DIAGNOSIS — M25572 Pain in left ankle and joints of left foot: Secondary | ICD-10-CM | POA: Diagnosis not present

## 2024-03-09 DIAGNOSIS — M7989 Other specified soft tissue disorders: Secondary | ICD-10-CM | POA: Diagnosis not present

## 2024-03-09 DIAGNOSIS — F17210 Nicotine dependence, cigarettes, uncomplicated: Secondary | ICD-10-CM | POA: Insufficient documentation

## 2024-03-09 DIAGNOSIS — E119 Type 2 diabetes mellitus without complications: Secondary | ICD-10-CM | POA: Diagnosis not present

## 2024-03-09 DIAGNOSIS — Z7982 Long term (current) use of aspirin: Secondary | ICD-10-CM | POA: Insufficient documentation

## 2024-03-09 DIAGNOSIS — W500XXA Accidental hit or strike by another person, initial encounter: Secondary | ICD-10-CM | POA: Diagnosis not present

## 2024-03-09 DIAGNOSIS — I5040 Unspecified combined systolic (congestive) and diastolic (congestive) heart failure: Secondary | ICD-10-CM | POA: Diagnosis not present

## 2024-03-09 DIAGNOSIS — E782 Mixed hyperlipidemia: Secondary | ICD-10-CM | POA: Diagnosis not present

## 2024-03-09 DIAGNOSIS — Z8673 Personal history of transient ischemic attack (TIA), and cerebral infarction without residual deficits: Secondary | ICD-10-CM | POA: Insufficient documentation

## 2024-03-09 DIAGNOSIS — I5042 Chronic combined systolic (congestive) and diastolic (congestive) heart failure: Secondary | ICD-10-CM | POA: Diagnosis not present

## 2024-03-09 DIAGNOSIS — S91002A Unspecified open wound, left ankle, initial encounter: Secondary | ICD-10-CM | POA: Insufficient documentation

## 2024-03-09 DIAGNOSIS — N1831 Chronic kidney disease, stage 3a: Secondary | ICD-10-CM | POA: Diagnosis not present

## 2024-03-09 DIAGNOSIS — F1090 Alcohol use, unspecified, uncomplicated: Secondary | ICD-10-CM | POA: Insufficient documentation

## 2024-03-09 DIAGNOSIS — F331 Major depressive disorder, recurrent, moderate: Secondary | ICD-10-CM | POA: Diagnosis not present

## 2024-03-09 DIAGNOSIS — J449 Chronic obstructive pulmonary disease, unspecified: Secondary | ICD-10-CM | POA: Insufficient documentation

## 2024-03-09 DIAGNOSIS — M79672 Pain in left foot: Secondary | ICD-10-CM | POA: Diagnosis not present

## 2024-03-09 DIAGNOSIS — E1122 Type 2 diabetes mellitus with diabetic chronic kidney disease: Secondary | ICD-10-CM | POA: Diagnosis not present

## 2024-03-09 DIAGNOSIS — I110 Hypertensive heart disease with heart failure: Secondary | ICD-10-CM | POA: Diagnosis not present

## 2024-03-09 DIAGNOSIS — I25119 Atherosclerotic heart disease of native coronary artery with unspecified angina pectoris: Secondary | ICD-10-CM | POA: Diagnosis not present

## 2024-03-09 DIAGNOSIS — R2242 Localized swelling, mass and lump, left lower limb: Secondary | ICD-10-CM | POA: Insufficient documentation

## 2024-03-09 DIAGNOSIS — I69352 Hemiplegia and hemiparesis following cerebral infarction affecting left dominant side: Secondary | ICD-10-CM | POA: Diagnosis not present

## 2024-03-09 DIAGNOSIS — M7732 Calcaneal spur, left foot: Secondary | ICD-10-CM | POA: Diagnosis not present

## 2024-03-09 DIAGNOSIS — J9611 Chronic respiratory failure with hypoxia: Secondary | ICD-10-CM | POA: Diagnosis not present

## 2024-03-09 DIAGNOSIS — L03116 Cellulitis of left lower limb: Secondary | ICD-10-CM | POA: Diagnosis not present

## 2024-03-09 DIAGNOSIS — S81802S Unspecified open wound, left lower leg, sequela: Secondary | ICD-10-CM

## 2024-03-09 DIAGNOSIS — M1990 Unspecified osteoarthritis, unspecified site: Secondary | ICD-10-CM | POA: Diagnosis not present

## 2024-03-09 DIAGNOSIS — M79662 Pain in left lower leg: Secondary | ICD-10-CM

## 2024-03-09 DIAGNOSIS — I130 Hypertensive heart and chronic kidney disease with heart failure and stage 1 through stage 4 chronic kidney disease, or unspecified chronic kidney disease: Secondary | ICD-10-CM | POA: Diagnosis not present

## 2024-03-09 DIAGNOSIS — Z8679 Personal history of other diseases of the circulatory system: Secondary | ICD-10-CM

## 2024-03-09 DIAGNOSIS — G4733 Obstructive sleep apnea (adult) (pediatric): Secondary | ICD-10-CM | POA: Diagnosis not present

## 2024-03-09 LAB — CBC WITH DIFFERENTIAL/PLATELET
Abs Immature Granulocytes: 0.04 K/uL (ref 0.00–0.07)
Basophils Absolute: 0.1 K/uL (ref 0.0–0.1)
Basophils Relative: 1 %
Eosinophils Absolute: 0.5 K/uL (ref 0.0–0.5)
Eosinophils Relative: 5 %
HCT: 46.5 % (ref 39.0–52.0)
Hemoglobin: 14.8 g/dL (ref 13.0–17.0)
Immature Granulocytes: 0 %
Lymphocytes Relative: 28 %
Lymphs Abs: 2.6 K/uL (ref 0.7–4.0)
MCH: 30.1 pg (ref 26.0–34.0)
MCHC: 31.8 g/dL (ref 30.0–36.0)
MCV: 94.5 fL (ref 80.0–100.0)
Monocytes Absolute: 0.9 K/uL (ref 0.1–1.0)
Monocytes Relative: 10 %
Neutro Abs: 5.3 K/uL (ref 1.7–7.7)
Neutrophils Relative %: 56 %
Platelets: 201 K/uL (ref 150–400)
RBC: 4.92 MIL/uL (ref 4.22–5.81)
RDW: 14.6 % (ref 11.5–15.5)
WBC: 9.4 K/uL (ref 4.0–10.5)
nRBC: 0 % (ref 0.0–0.2)

## 2024-03-09 LAB — BASIC METABOLIC PANEL WITH GFR
Anion gap: 11 (ref 5–15)
BUN: 24 mg/dL — ABNORMAL HIGH (ref 6–20)
CO2: 24 mmol/L (ref 22–32)
Calcium: 9.1 mg/dL (ref 8.9–10.3)
Chloride: 107 mmol/L (ref 98–111)
Creatinine, Ser: 1.19 mg/dL (ref 0.61–1.24)
GFR, Estimated: >60 mL/min (ref >60)
Glucose, Bld: 99 mg/dL (ref 70–99)
Potassium: 3.6 mmol/L (ref 3.5–5.1)
Sodium: 142 mmol/L (ref 135–145)

## 2024-03-09 LAB — CBG MONITORING, ED: Glucose-Capillary: 77 mg/dL (ref 70–99)

## 2024-03-09 MED ORDER — CEFAZOLIN SODIUM-DEXTROSE 2-4 GM/100ML-% IV SOLN
2.0000 g | Freq: Once | INTRAVENOUS | Status: AC
  Administered 2024-03-09: 2 g via INTRAVENOUS
  Filled 2024-03-09: qty 100

## 2024-03-09 MED ORDER — CEFAZOLIN SODIUM-DEXTROSE 2-4 GM/100ML-% IV SOLN
2.0000 g | Freq: Three times a day (TID) | INTRAVENOUS | Status: DC
  Administered 2024-03-10 (×2): 2 g via INTRAVENOUS
  Filled 2024-03-09 (×2): qty 100

## 2024-03-09 MED ORDER — OXYCODONE-ACETAMINOPHEN 5-325 MG PO TABS
1.0000 | ORAL_TABLET | Freq: Once | ORAL | Status: AC
  Administered 2024-03-09: 1 via ORAL
  Filled 2024-03-09: qty 1

## 2024-03-09 MED ORDER — INSULIN ASPART 100 UNIT/ML IJ SOLN: 0.0000 [IU] | Freq: Three times a day (TID) | INTRAMUSCULAR | Status: DC

## 2024-03-09 NOTE — ED Triage Notes (Signed)
 Pt with wound to left ankle, pt seen PT for wound therapy and was told it's not any better. Redness noted to LE.  Sent here for US  to r/o DVT per pt . Pt states wound ongoing for 5 months. Denies any fevers.
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
## 2024-03-09 NOTE — ED Provider Notes (Signed)
 [place] EMERGENCY DEPARTMENT AT [facility] Provider Note   CSN: [id number] Arrival date & time: 03/09/24  1530     Patient presents with: Wound Infection   [name] is a 58 y.o. male.   HPI      [name] is a 58 y.o. male past medical history of hypertension anxiety, prior stroke, COPD, CHF, type 2 diabetes cardiomyopathy who presents to the Emergency Department complaining of left ankle pain and nonhealing wound.  He states that he struck his left ankle on his wheelchair 5 months ago and suffered an open wound.  He has been seen by PT for wound therapy, but states the area is not healing.  He continues to have pain and is noticed increased swelling to the dorsal foot and his left lower leg for several days.  He denies any fevers or chills.  States he is completed a 10-day course of antibiotics 2 days ago.  He is unsure the name of the antibiotic but believes it was doxycycline.  He denies any chest pain, fever, chills, numbness or weakness of the extremity.   Prior to Admission medications   Medication Sig Start Date End Date Taking? Authorizing Provider  acetaminophen  (TYLENOL ) 500 MG tablet Take 1,000 mg by mouth every 6 (six) hours as needed for moderate pain or headache.    [provider]  albuterol  (PROVENTIL  HFA;VENTOLIN  HFA) 108 (90 BASE) MCG/ACT inhaler Inhale 2 puffs into the lungs every 6 (six) hours as needed for wheezing or shortness of breath.    [provider]  ALPRAZolam  (XANAX ) 1 MG tablet Take 1 mg by mouth 2 (two) times daily as needed.    [provider]  aspirin  EC 81 MG tablet Take 1 tablet (81 mg total) by mouth daily. 03/04/18   Debera Jayson KANDICE, MD  atorvastatin  (LIPITOR ) 80 MG tablet Take 1 tablet (80 mg total) by mouth daily. 08/02/22 01/29/24  Furth, Cadence H, PA-C  baclofen  (LIORESAL ) 20 MG tablet Take by mouth. 09/25/21   [provider]  bisacodyl (DULCOLAX) 5 MG EC tablet Take by  mouth.    [provider]  carvedilol  (COREG ) 6.25 MG tablet Take 1 tablet (6.25 mg total) by mouth 2 (two) times daily. 08/04/23   Debera Jayson KANDICE, MD  Cholecalciferol (VITAMIN D) 50 MCG (2000 UT) CAPS 1 capsule    [provider]  clopidogrel  (PLAVIX ) 75 MG tablet [id number]  Plavix  75 mg tablet 75 milligrams PO once a day for ? CVA; 1 tab po daily 11/01/21   [provider]  COSENTYX UNOREADY 300 MG/2ML SOAJ Inject into the skin.    [provider]  dapsone  25 MG tablet Take by mouth.    [provider]  dibucaine (NUPERCAINAL) 1 % OINT 1 application as needed    [provider]  DULoxetine  (CYMBALTA ) 60 MG capsule Take 1 capsule (60 mg total) by mouth daily. Patient taking differently: Take 60 mg by mouth 2 (two) times daily. 06/25/22 01/29/24  Vickey Mettle, MD  ezetimibe  (ZETIA ) 10 MG tablet take 1 tablet (10 MILLIGRAM total) by mouth daily. 02/09/24   Debera Jayson KANDICE, MD  furosemide  (LASIX ) 20 MG tablet Take 20 mg by mouth.    [provider]  isosorbide  mononitrate (IMDUR ) 30 MG 24 hr tablet 1 tablet in the morning    [provider]  isosorbide  mononitrate (IMDUR ) 60 MG 24 hr tablet Take 60 mg by mouth 2 (two) times daily.  [provider]  midodrine  (PROAMATINE ) 10 MG tablet Take 10 mg by mouth 2 (two) times daily.    [provider]  naloxone [facility]) nasal spray 4 mg/0.1 mL Place 1 spray into the nose as needed (opioid overdose). 11/23/20   [provider]  nitroGLYCERIN  (NITROSTAT ) 0.4 MG SL tablet 1 tablet as directed    [provider]  nystatin  (MYCOSTATIN ) 100000 UNIT/ML suspension Take by mouth. 10/01/21   [provider]  oxyCODONE -acetaminophen  (PERCOCET) 10-325 MG tablet Take 1 tablet by mouth 3 (three) times daily as needed. 10/28/22   [provider]  OZEMPIC, 1 MG/DOSE, 4 MG/3ML SOPN Inject 1 mg into the skin once a week. 11/01/22   [provider]  polyethylene glycol powder (GLYCOLAX/MIRALAX) 17 GM/SCOOP powder Take 17 g by mouth daily. 12/17/21   [provider]  pregabalin  (LYRICA ) 200 MG capsule Take 1 capsule (200 mg total) by mouth 2 (two) times daily. 10/21/23   Skeet Juliene SAUNDERS, DO    Allergies: Ibuprofen, Iodinated contrast media, Nsaids, Ace inhibitors, Chantix [varenicline], Other, and Pollen extract    Review of Systems  Constitutional:  Negative for appetite change, chills and fever.  Respiratory:  Negative for cough, chest tightness and shortness of breath.   Cardiovascular:  Positive for leg swelling. Negative for chest pain.  Gastrointestinal:  Negative for abdominal pain, diarrhea, nausea and vomiting.  Musculoskeletal:  Positive for arthralgias (Left ankle pain swelling open wound). Negative for back pain.  Neurological:  Negative for weakness and numbness.    Updated Vital Signs BP 91/60 (BP Location: Right Arm)   Pulse 80   Temp 97.8 F (36.6 C) (Oral)   Resp 18   Ht 5' 11 (1.803 m)   Wt 96.6 kg   SpO2 96%   BMI 29.71 kg/m   Physical Exam Vitals and nursing note reviewed.  Constitutional:      General: He is not in acute distress.    Appearance: Normal appearance. He is not ill-appearing or toxic-appearing.  Cardiovascular:     Rate and Rhythm: Normal rate and regular rhythm.     Pulses: Normal pulses.     Comments: Dorsalis pedis and posterior tibial pulses of left foot heard with portable Doppler Pulmonary:     Effort: Pulmonary effort is normal.  Musculoskeletal:        General: Swelling and tenderness present.     Right lower leg: No edema.     Left lower leg: Edema present.     Comments: 2 cm skin wound over the lateral malleolus.  No purulent drainage.  Area is tender to palpation.  Edema left lower extremity with erythema and warmth distally.  Edema extends to the dorsal left foot as well.  No lymphangitis  Skin:    General: Skin is warm.     Capillary Refill: Capillary  refill takes less than 2 seconds.     Findings: Erythema present.  Neurological:     General: No focal deficit present.     Mental Status: He is alert.     Sensory: No sensory deficit.     Motor: No weakness.        (all labs ordered are listed, but only abnormal results are displayed) Labs Reviewed  BASIC METABOLIC PANEL WITH GFR - Abnormal; Notable for the following components:      Result Value   BUN 24 (*)    All other components within normal limits  CBC WITH DIFFERENTIAL/PLATELET  HEMOGLOBIN A1C  EKG: None  Radiology: DG Ankle Complete Left Result Date: 03/09/2024 CLINICAL DATA:  pain, swelling , non healing wound EXAM: LEFT ANKLE COMPLETE - 3+ VIEW; LEFT FOOT - COMPLETE 3+ VIEW COMPARISON:  None Available. FINDINGS: There is no evidence of fracture, dislocation, or joint effusion. Old healed medial malleolar fracture. There is no evidence of arthropathy or other focal bone abnormality. Posterior and plantar calcaneal spur. Ankle subcutaneus soft tissue edema. Vascular calcification. IMPRESSION: No acute displaced fracture or dislocation of the left ankle and foot. Electronically Signed   By: Morgane  Naveau M.D.   On: 03/09/2024 19:02   DG Foot Complete Left Result Date: 03/09/2024 CLINICAL DATA:  pain, swelling , non healing wound EXAM: LEFT ANKLE COMPLETE - 3+ VIEW; LEFT FOOT - COMPLETE 3+ VIEW COMPARISON:  None Available. FINDINGS: There is no evidence of fracture, dislocation, or joint effusion. Old healed medial malleolar fracture. There is no evidence of arthropathy or other focal bone abnormality. Posterior and plantar calcaneal spur. Ankle subcutaneus soft tissue edema. Vascular calcification. IMPRESSION: No acute displaced fracture or dislocation of the left ankle and foot. Electronically Signed   By: Morgane  Naveau M.D.   On: 03/09/2024 19:02     Procedures   Medications Ordered in the ED  oxyCODONE -acetaminophen  (PERCOCET/ROXICET) 5-325 MG per tablet 1  tablet (has no administration in time range)                                    Medical Decision Making Patient here with nonhealing wound x 5 months has been undergoing wound therapy without improvement.  No reported fever chills or purulent drainage.   Small wound to the skin over the lateral malleolus.  I do not appreciate any purulent drainage.  There is warmth edema and erythema of the left lower leg and edema extends into the dorsal foot.  His pulses heard with portable Doppler.  Differential would include but not limited to osteomyelitis, cellulitis, DVT  Amount and/or Complexity of Data Reviewed Labs: ordered.    Details: No evidence of leukocytosis chemistries without significant derangement Radiology: ordered.    Details: Plain films of the left ankle and left foot without evidence of fracture or dislocation Discussion of management or test interpretation with external provider(s): Patient here with likely cellulitis of the left lower extremity secondary to a nonhealing wound.  DVT is also of concern, unfortunately ultrasound is unavailable after hours.  Patient given Ancef  here, will admit for cellulitis and likely plan for ultrasound tomorrow morning  Consulted [facility] hospitalist, Dr. Manfred who agrees to admit  Risk Prescription drug management. Decision regarding hospitalization.        Final diagnoses:  Cellulitis of left lower extremity    ED Discharge Orders     None          Herlinda Madelin RIGGERS 03/09/24 2141    Suzette Pac, MD 03/10/24 1019

## 2024-03-09 NOTE — H&P (Incomplete)
 History and Physical    Patient: [name] FMW:[id number] DOB: [birth year] DOA: 03/09/2024 DOS: the patient was seen and examined on 03/10/2024 PCP: Toribio Jerel KANDICE, MD  Patient coming from: Home  Chief Complaint:  Chief Complaint  Patient presents with   Wound Infection   HPI: [name] is a 58 y.o. 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  He endorsed accidentally striking his left ankle on his wheelchair a few months ago that resulted in an open wound, he has been following up with wound therapy and PT, but the wound was not healing.  He noted several days of worsening and increased pain and swelling to left lower leg without fever, he states he completed a 10-day antibiotic (thought to be doxycycline) regimen without improvement, so he presented to the ED for further evaluation and management.  Patient denies fever, chills chest pain, shortness of breath.  ED Course:  In the emergency department, BP was 91/60, other vital signs are within normal range.  Workup in the ED showed normal CBC and BMP except for BUN of 24. Left ankle and left foot x-ray showed no acute displaced fracture or dislocation of the left ankle and foot. He was treated with IV Ancef  2 g x 1, Percocet 3-325 mg p.o. x 1 was given. TRH was asked to admit patient  Review of Systems: Review of systems as noted in the HPI. All other systems reviewed and are negative.   Past Medical History:  Diagnosis Date   Anaphylaxis    IGE mediated   Anxiety    Arthritis    Cancer (HCC)    Prostate   Cardiomyopathy (HCC)    CHF (congestive heart failure) (HCC)    COPD (chronic obstructive pulmonary disease) (HCC)    Coronary atherosclerosis of native coronary artery    Multivessel  status post CABG 2010   Depression    Essential hypertension    History of CVA (cerebrovascular accident) 01/2012   Right posterior frontal cortical and subcortical brain by MRI, no hemorrhage. Carotid Dopplers showed only 1-50% bilateral ICA stenoses. Echocardiogram showed LVEF 50%, no major valvular abnormalities.   History of kidney stones    Mixed hyperlipidemia    Myocardial infarction (HCC) 2010   OSA (obstructive sleep apnea)    Pneumonia    Prostate cancer (HCC) 12/2020   Dr. Alvaro at [facility]   Stroke [facility])    Type 2 diabetes mellitus [facility])    Past Surgical History:  Procedure Laterality Date   CHOLECYSTECTOMY     CORONARY ARTERY BYPASS GRAFT  2010   LIMA to LAD, SVG to diagonal, SVG to OM1 and OM 2, SVG to RCA   DENTAL SURGERY  2003   GASTRIC BYPASS  2010   HERNIA REPAIR  2011, 2012   INCISIONAL HERNIA REPAIR N/A 07/23/2013   Procedure: HERNIA REPAIR INCISIONAL WITH MESH;  Surgeon: Oneil DELENA Budge, MD;  Location: [facility];  Service: General;  Laterality: N/A;   INSERTION OF MESH N/A 07/23/2013   Procedure: INSERTION OF MESH;  Surgeon: Oneil DELENA Budge, MD;  Location: [facility];  Service: General;  Laterality: N/A;   LEFT HEART CATHETERIZATION WITH CORONARY/GRAFT ANGIOGRAM N/A 11/01/2013   Procedure: LEFT HEART CATHETERIZATION WITH EL BILE;  Surgeon: Ozell JONETTA Fell,  MD;  Location: [facility];  Service: Cardiovascular;  Laterality: N/A;   LYMPH NODE DISSECTION Bilateral 04/04/2021   Procedure: PELVIC LYMPH NODE DISSECTION;  Surgeon: Alvaro Hummer, MD;  Location: [facility];  Service: Urology;  Laterality: Bilateral;   ROBOT ASSISTED LAPAROSCOPIC RADICAL PROSTATECTOMY N/A 04/04/2021   Procedure: XI ROBOTIC ASSISTED LAPAROSCOPIC RADICAL PROSTATECTOMY WITH INDOCYANINE GREEN DYE;  Surgeon: Alvaro Hummer, MD;  Location: [facility];  Service: Urology;  Laterality: N/A;  3 HRS   TOE AMPUTATION  1998   right 1st and 2nd toe   TONSILECTOMY, ADENOIDECTOMY, BILATERAL  MYRINGOTOMY AND TUBES     TONSILLECTOMY     VENTRAL HERNIA REPAIR N/A 10/28/2012   Procedure: LAPAROSCOPIC VENTRAL HERNIA;  Surgeon: Thresa JAYSON Pulling, MD;  Location: [facility];  Service: General;  Laterality: N/A;    Social History:  reports that he has been smoking cigarettes. He started smoking about 41 years ago. He has a 41.8 pack-year smoking history. He has been exposed to tobacco smoke. He has never used smokeless tobacco. He reports current alcohol  use. He reports that he does not use drugs.   Allergies  Allergen Reactions   Ibuprofen Anaphylaxis, Hives and Swelling   Iodinated Contrast Media Anaphylaxis, Shortness Of Breath, Itching, Swelling and Rash    Isovue contrast is most acceptable agent based on previous experience and testing with premedications Throat swells up   Nsaids Anaphylaxis, Hives, Swelling, Dermatitis and Rash    Can take Aspirin  325 mg or lower   Other Anaphylaxis and Other (See Comments)    Anti-psychotics Personality change   Ace Inhibitors Other (See Comments)    Unknown    Chantix [Varenicline] Other (See Comments)    Nightmares    Pollen Extract Other (See Comments)    Unknown     Family History  Problem Relation Age of Onset   Lung cancer Father    Heart disease Father    Heart disease Mother    Congestive Heart Failure Mother    Diabetes Mother    Hyperlipidemia Mother    Alcohol  abuse Brother    Breast cancer Maternal Aunt    Suicidality Cousin      Prior to Admission medications   Medication Sig Start Date End Date Taking? Authorizing Provider  acetaminophen  (TYLENOL ) 500 MG tablet Take 1,000 mg by mouth every 6 (six) hours as needed for moderate pain or headache.    [provider]  albuterol  (PROVENTIL  HFA;VENTOLIN  HFA) 108 (90 BASE) MCG/ACT inhaler Inhale 2 puffs into the lungs every 6 (six) hours as needed for wheezing or shortness of breath.    [provider]  ALPRAZolam  (XANAX ) 1 MG tablet Take 1 mg by mouth 2  (two) times daily as needed.    [provider]  aspirin  EC 81 MG tablet Take 1 tablet (81 mg total) by mouth daily. 03/04/18   Debera Jayson MATSU, MD  atorvastatin  (LIPITOR ) 80 MG tablet Take 1 tablet (80 mg total) by mouth daily. 08/02/22 01/29/24  Furth, Cadence H, PA-C  baclofen  (LIORESAL ) 20 MG tablet Take by mouth. 09/25/21   [provider]  bisacodyl (DULCOLAX) 5 MG EC tablet Take by mouth.    [provider]  carvedilol  (COREG ) 6.25 MG tablet Take 1 tablet (6.25 mg total) by mouth 2 (two) times daily. 08/04/23   Debera Jayson MATSU, MD  Cholecalciferol (VITAMIN D) 50 MCG (2000 UT) CAPS 1 capsule    [provider]  clopidogrel  (PLAVIX ) 75 MG tablet [id number]  Plavix  75 mg tablet 75 milligrams PO once a day for ? CVA; 1 tab po daily 11/01/21   [provider]  COSENTYX UNOREADY 300 MG/2ML SOAJ Inject into the skin.    [provider]  dapsone  25 MG tablet Take by mouth.    [provider]  dibucaine (NUPERCAINAL) 1 % OINT 1 application as needed    [provider]  DULoxetine  (CYMBALTA ) 60 MG capsule Take 1 capsule (60 mg total) by mouth daily. Patient taking differently: Take 60 mg by mouth 2 (two) times daily. 06/25/22 01/29/24  Vickey Mettle, MD  ezetimibe  (ZETIA ) 10 MG tablet take 1 tablet (10 MILLIGRAM total) by mouth daily. 02/09/24   Debera Jayson MATSU, MD  furosemide  (LASIX ) 20 MG tablet Take 20 mg by mouth.    [provider]  isosorbide  mononitrate (IMDUR ) 30 MG 24 hr tablet 1 tablet in the morning    [provider]  isosorbide  mononitrate (IMDUR ) 60 MG 24 hr tablet Take 60 mg by mouth 2 (two) times daily.     [provider]  midodrine  (PROAMATINE ) 10 MG tablet Take 10 mg by mouth 2 (two) times daily.    [provider]  naloxone [facility]) nasal spray 4 mg/0.1 mL Place 1 spray into the nose as needed (opioid overdose). 11/23/20   [provider]  nitroGLYCERIN  (NITROSTAT )  0.4 MG SL tablet 1 tablet as directed    [provider]  nystatin  (MYCOSTATIN ) 100000 UNIT/ML suspension Take by mouth. 10/01/21   [provider]  oxyCODONE -acetaminophen  (PERCOCET) 10-325 MG tablet Take 1 tablet by mouth 3 (three) times daily as needed. 10/28/22   [provider]  OZEMPIC, 1 MG/DOSE, 4 MG/3ML SOPN Inject 1 mg into the skin once a week. 11/01/22   [provider]  polyethylene glycol powder (GLYCOLAX/MIRALAX) 17 GM/SCOOP powder Take 17 g by mouth daily. 12/17/21   [provider]  pregabalin  (LYRICA ) 200 MG capsule Take 1 capsule (200 mg total) by mouth 2 (two) times daily. 10/21/23   Skeet Juliene SAUNDERS, DO    Physical Exam: BP (!) 112/94 (BP Location: Left Arm)   Pulse 70   Temp 98.5 F (36.9 C) (Oral)   Resp 20   Ht 5' 11 (1.803 m)   Wt 96.6 kg   SpO2 94%   BMI 29.71 kg/m   General: 58 y.o. year-old male well developed well nourished in no acute distress.  Alert and oriented x3. HEENT: NCAT, EOMI Neck: Supple, trachea medial Cardiovascular: Regular rate and rhythm with no rubs or gallops.  No thyromegaly or JVD noted.  No lower extremity edema. 2/4 pulses in all 4 extremities. Respiratory: Clear to auscultation with no wheezes or rales. Good inspiratory effort. Abdomen: Soft, nontender nondistended with normal bowel sounds x4 quadrants. Muskuloskeletal: Open wound over the lateral malleolus.  Left lower extremity swelling from the foot to 2 third mid shin.  Noted erythema with warmth and tenderness to palpation of LLE.   Neuro: CN II-XII intact, left-sided weakness (from prior stroke), sensation, reflexes intact Skin: No ulcerative lesions noted or rashes Psychiatry: Judgement and insight appear normal. Mood is appropriate for condition and setting                 Labs on Admission:  Basic Metabolic Panel: Recent Labs  Lab 03/09/24 1632  NA 142  K 3.6  CL 107  CO2 24  GLUCOSE 99  BUN 24*  CREATININE 1.19   CALCIUM  9.1  Liver Function Tests: No results for input(s): AST, ALT, ALKPHOS, BILITOT, PROT, ALBUMIN  in the last 168 hours. No results for input(s): LIPASE, AMYLASE in the last 168 hours. No results for input(s): AMMONIA in the last 168 hours. CBC: Recent Labs  Lab 03/09/24 1632  WBC 9.4  NEUTROABS 5.3  HGB 14.8  HCT 46.5  MCV 94.5  PLT 201   Cardiac Enzymes: No results for input(s): CKTOTAL, CKMB, CKMBINDEX, TROPONINI in the last 168 hours.  BNP (last 3 results) No results for input(s): BNP in the last 8760 hours.  ProBNP (last 3 results) No results for input(s): PROBNP in the last 8760 hours.  CBG: Recent Labs  Lab 03/09/24 2227  GLUCAP 77    Radiological Exams on Admission: DG Ankle Complete Left Result Date: 03/09/2024 CLINICAL DATA:  pain, swelling , non healing wound EXAM: LEFT ANKLE COMPLETE - 3+ VIEW; LEFT FOOT - COMPLETE 3+ VIEW COMPARISON:  None Available. FINDINGS: There is no evidence of fracture, dislocation, or joint effusion. Old healed medial malleolar fracture. There is no evidence of arthropathy or other focal bone abnormality. Posterior and plantar calcaneal spur. Ankle subcutaneus soft tissue edema. Vascular calcification. IMPRESSION: No acute displaced fracture or dislocation of the left ankle and foot. Electronically Signed   By: Morgane  Naveau M.D.   On: 03/09/2024 19:02   DG Foot Complete Left Result Date: 03/09/2024 CLINICAL DATA:  pain, swelling , non healing wound EXAM: LEFT ANKLE COMPLETE - 3+ VIEW; LEFT FOOT - COMPLETE 3+ VIEW COMPARISON:  None Available. FINDINGS: There is no evidence of fracture, dislocation, or joint effusion. Old healed medial malleolar fracture. There is no evidence of arthropathy or other focal bone abnormality. Posterior and plantar calcaneal spur. Ankle subcutaneus soft tissue edema. Vascular calcification. IMPRESSION: No acute displaced fracture or dislocation of the left ankle and foot.  Electronically Signed   By: Morgane  Naveau M.D.   On: 03/09/2024 19:02    EKG: I independently viewed the EKG done and my findings are as followed: EKG was not done in the ED  Assessment/Plan Present on Admission:  Cellulitis of left lower extremity  Mixed hyperlipidemia  COPD (chronic obstructive pulmonary disease) (HCC)  Principal Problem:   Cellulitis of left lower extremity Active Problems:   Mixed hyperlipidemia   History of CVA (cerebrovascular accident)   COPD (chronic obstructive pulmonary disease) (HCC)   Type 2 diabetes mellitus (HCC)   Open wound of left ankle   Localized swelling of left lower extremity   Chronic combined systolic and diastolic CHF (congestive heart failure) (HCC)   History of hypertension  Cellulitis of left lower extremity Patient was started on Ancef , continue with same at this time Continue IV Dilaudid  0.5 every 4 hours as needed for moderate and severe pain  Open wound of left ankle Continue wound care Continue Dilaudid  as described above Continue PT/OT eval and treat  Left lower extremity swelling Left lower extremity ultrasound will be done in the morning to rule out DVT  History of CVA Continue Lipitor , clopidogrel   Combined systolic and diastolic CHF Home meds temporarily held due to soft BP  History of hypertension Consider resuming home meds when BP improves  Mixed hyperlipidemia Continue Lipitor , Zetia   T2DM Continue ISS and hypoglycemia protocol  COPD (not in acute exacerbation) Continue albuterol   DVT prophylaxis: Lovenox   Code Status: Full code  Family Communication: None at bedside  Consults: Wound nurse  Severity of Illness: The appropriate patient status for this patient is INPATIENT. Inpatient status  is judged to be reasonable and necessary in order to provide the required intensity of service to ensure the patient's safety. The patient's presenting symptoms, physical exam findings, and initial radiographic  and laboratory data in the context of their chronic comorbidities is felt to place them at high risk for further clinical deterioration. Furthermore, it is not anticipated that the patient will be medically stable for discharge from the hospital within 2 midnights of admission.   * I certify that at the point of admission it is my clinical judgment that the patient will require inpatient hospital care spanning beyond 2 midnights from the point of admission due to high intensity of service, high risk for further deterioration and high frequency of surveillance required.*  Author: Odilon Cass, DO 03/10/2024 1:39 AM  For on call review [email or web address].

## 2024-03-09 NOTE — ED Notes (Signed)
 2 pillows placed under pt left lower leg to elevate his foot.

## 2024-03-10 ENCOUNTER — Inpatient Hospital Stay (HOSPITAL_COMMUNITY)

## 2024-03-10 DIAGNOSIS — M7989 Other specified soft tissue disorders: Secondary | ICD-10-CM | POA: Diagnosis not present

## 2024-03-10 DIAGNOSIS — R600 Localized edema: Secondary | ICD-10-CM | POA: Diagnosis not present

## 2024-03-10 DIAGNOSIS — L03116 Cellulitis of left lower limb: Secondary | ICD-10-CM | POA: Diagnosis not present

## 2024-03-10 LAB — COMPREHENSIVE METABOLIC PANEL WITH GFR
ALT: 18 U/L (ref 0–44)
AST: 17 U/L (ref 15–41)
Albumin: 2.9 g/dL — ABNORMAL LOW (ref 3.5–5.0)
Alkaline Phosphatase: 62 U/L (ref 38–126)
Anion gap: 8 (ref 5–15)
BUN: 20 mg/dL (ref 6–20)
CO2: 26 mmol/L (ref 22–32)
Calcium: 8.5 mg/dL — ABNORMAL LOW (ref 8.9–10.3)
Chloride: 108 mmol/L (ref 98–111)
Creatinine, Ser: 1.14 mg/dL (ref 0.61–1.24)
GFR, Estimated: >60 mL/min (ref >60)
Glucose, Bld: 90 mg/dL (ref 70–99)
Potassium: 3.5 mmol/L (ref 3.5–5.1)
Sodium: 142 mmol/L (ref 135–145)
Total Bilirubin: 0.6 mg/dL (ref 0.0–1.2)
Total Protein: 5.8 g/dL — ABNORMAL LOW (ref 6.5–8.1)

## 2024-03-10 LAB — CBC
HCT: 43 % (ref 39.0–52.0)
Hemoglobin: 13.5 g/dL (ref 13.0–17.0)
MCH: 29.5 pg (ref 26.0–34.0)
MCHC: 31.4 g/dL (ref 30.0–36.0)
MCV: 93.9 fL (ref 80.0–100.0)
Platelets: 170 K/uL (ref 150–400)
RBC: 4.58 MIL/uL (ref 4.22–5.81)
RDW: 14.4 % (ref 11.5–15.5)
WBC: 8.7 K/uL (ref 4.0–10.5)
nRBC: 0 % (ref 0.0–0.2)

## 2024-03-10 LAB — GLUCOSE, CAPILLARY
Glucose-Capillary: 86 mg/dL (ref 70–99)
Glucose-Capillary: 81 mg/dL (ref 70–99)

## 2024-03-10 LAB — MAGNESIUM: Magnesium: 1.9 mg/dL (ref 1.7–2.4)

## 2024-03-10 LAB — HIV ANTIBODY (ROUTINE TESTING W REFLEX): HIV Screen 4th Generation wRfx: NONREACTIVE

## 2024-03-10 LAB — MRSA NEXT GEN BY PCR, NASAL: MRSA by PCR Next Gen: NOT DETECTED

## 2024-03-10 LAB — PHOSPHORUS: Phosphorus: 3.1 mg/dL (ref 2.5–4.6)

## 2024-03-10 MED ORDER — ONDANSETRON HCL 4 MG/2ML IJ SOLN: 4.0000 mg | Freq: Four times a day (QID) | INTRAMUSCULAR | Status: DC | PRN

## 2024-03-10 MED ORDER — ISOSORBIDE MONONITRATE ER 30 MG PO TB24: 30.0000 mg | ORAL_TABLET | Freq: Every day | ORAL | Status: DC

## 2024-03-10 MED ORDER — ALBUTEROL SULFATE (2.5 MG/3ML) 0.083% IN NEBU: 2.5000 mg | INHALATION_SOLUTION | Freq: Four times a day (QID) | RESPIRATORY_TRACT | Status: DC | PRN

## 2024-03-10 MED ORDER — ATORVASTATIN CALCIUM 40 MG PO TABS
80.0000 mg | ORAL_TABLET | Freq: Every day | ORAL | Status: DC
Start: 2024-03-10 — End: 2024-03-10
  Administered 2024-03-10: 80 mg via ORAL
  Filled 2024-03-10: qty 2

## 2024-03-10 MED ORDER — MIDODRINE HCL 5 MG PO TABS
10.0000 mg | ORAL_TABLET | Freq: Two times a day (BID) | ORAL | Status: DC
  Administered 2024-03-10: 10 mg via ORAL
  Filled 2024-03-10: qty 2

## 2024-03-10 MED ORDER — ALBUTEROL SULFATE HFA 108 (90 BASE) MCG/ACT IN AERS: 2.0000 | INHALATION_SPRAY | Freq: Four times a day (QID) | RESPIRATORY_TRACT | Status: DC | PRN

## 2024-03-10 MED ORDER — MEDIHONEY WOUND/BURN DRESSING EX PSTE
1.0000 | PASTE | Freq: Every day | CUTANEOUS | Status: DC
  Administered 2024-03-10: 1 via TOPICAL
  Filled 2024-03-10: qty 44

## 2024-03-10 MED ORDER — EZETIMIBE 10 MG PO TABS
10.0000 mg | ORAL_TABLET | Freq: Every day | ORAL | Status: DC
  Administered 2024-03-10: 10 mg via ORAL
  Filled 2024-03-10: qty 1

## 2024-03-10 MED ORDER — CARVEDILOL 3.125 MG PO TABS: 6.2500 mg | ORAL_TABLET | Freq: Two times a day (BID) | ORAL | Status: DC

## 2024-03-10 MED ORDER — ACETAMINOPHEN 325 MG PO TABS
650.0000 mg | ORAL_TABLET | Freq: Four times a day (QID) | ORAL | Status: DC | PRN
  Administered 2024-03-10: 650 mg via ORAL
  Filled 2024-03-10: qty 2

## 2024-03-10 MED ORDER — CLOPIDOGREL BISULFATE 75 MG PO TABS
75.0000 mg | ORAL_TABLET | Freq: Every day | ORAL | Status: DC
  Administered 2024-03-10: 75 mg via ORAL
  Filled 2024-03-10: qty 1

## 2024-03-10 MED ORDER — ONDANSETRON HCL 4 MG PO TABS: 4.0000 mg | ORAL_TABLET | Freq: Four times a day (QID) | ORAL | Status: DC | PRN

## 2024-03-10 MED ORDER — CEPHALEXIN 750 MG PO CAPS: 750.0000 mg | ORAL_CAPSULE | Freq: Four times a day (QID) | ORAL | 0 refills | Status: AC

## 2024-03-10 MED ORDER — ACETAMINOPHEN 650 MG RE SUPP: 650.0000 mg | Freq: Four times a day (QID) | RECTAL | Status: DC | PRN

## 2024-03-10 MED ORDER — NYSTATIN 100000 UNIT/GM EX POWD
Freq: Two times a day (BID) | CUTANEOUS | Status: DC
  Filled 2024-03-10: qty 15

## 2024-03-10 MED ORDER — ENOXAPARIN SODIUM 40 MG/0.4ML IJ SOSY
40.0000 mg | PREFILLED_SYRINGE | INTRAMUSCULAR | Status: DC
  Administered 2024-03-10: 40 mg via SUBCUTANEOUS
  Filled 2024-03-10: qty 0.4

## 2024-03-10 MED ORDER — SACUBITRIL-VALSARTAN 49-51 MG PO TABS
1.0000 | ORAL_TABLET | Freq: Two times a day (BID) | ORAL | Status: DC
  Filled 2024-03-10: qty 1
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
## 2024-03-10 NOTE — Care Management CC44 (Signed)
 Condition Code 44 Documentation Completed  Patient Details  Name: [name] MRN: [id number] Date of Birth: 1966-07-13   Condition Code 44 given:  Yes Patient signature on Condition Code 44 notice:  Yes Documentation of 2 MD's agreement:  Yes Code 44 added to claim:  Yes    [name], LCSWA 03/10/2024, 4:48 PM

## 2024-03-10 NOTE — Discharge Summary (Addendum)
 Physician Discharge Summary  [name] FMW:[id number] DOB: 05-29-1966 DOA: 03/09/2024  PCP: Toribio Jerel KANDICE, MD  Admit date: 03/09/2024  Discharge date: 03/10/2024  Admitted From:Home  Disposition:  Home  Recommendations for Outpatient Follow-up:  Follow up with PCP in 1-2 weeks Remain on Keflex  as prescribed for 10 days for streptococcal coverage after failure of doxycycline Continue other home medications as prior  Home Health: With PT  Equipment/Devices: Commode  Discharge Condition:Stable  CODE STATUS: Full  Diet recommendation: Heart Healthy/carb modified  Brief/Interim Summary: [name] is a 58 y.o. 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  He endorsed accidentally striking his left ankle on his wheelchair a few months ago that resulted in an open wound, he has been following up with wound therapy and PT, but the wound was not healing.  He noted several days of worsening and increased pain and swelling to left lower leg without fever, he states he completed a 10-day antibiotic (thought to be doxycycline) regimen without improvement, so he presented to the ED for further evaluation and management.    Patient has been admitted for cellulitis of the left lower extremity along with open wound and was started on Ancef .  Ultrasound study for DVT negative.  He is eager for discharge today and otherwise does not have any other concerns or complaints.  He appears stable for discharge with no fever or leukocytosis noted.  Discharge Diagnoses:  Principal Problem:   Cellulitis of left lower extremity Active Problems:   Mixed hyperlipidemia   History of CVA (cerebrovascular accident)   COPD (chronic obstructive pulmonary disease) (HCC)    Type 2 diabetes mellitus (HCC)   Open wound of left ankle   Localized swelling of left lower extremity   Chronic combined systolic and diastolic CHF (congestive heart failure) (HCC)   History of hypertension  Principal discharge diagnosis: Cellulitis of left lower extremity with associated open wound of left ankle.  Discharge Instructions  Discharge Instructions     Diet - low sodium heart healthy   Complete by: As directed    Discharge wound care:   Complete by: As directed    Daily      Comments: Cleanse L lateral ankle wound with NS, apply Medihoney to wound bed daily, cover with dry gauze and secure with Kerlix roll gauze anchored around foot   Increase activity slowly   Complete by: As directed       Allergies as of 03/10/2024       Reactions   Ibuprofen Anaphylaxis, Hives, Swelling   Iodinated Contrast Media Anaphylaxis, Shortness Of Breath, Itching, Swelling, Rash   Isovue contrast is most acceptable agent based on previous experience and testing with premedications Throat swells up   Nsaids Anaphylaxis, Hives, Swelling, Dermatitis, Rash   Can take Aspirin  325 mg or lower   Other Anaphylaxis, Other (See Comments)   Anti-psychotics Personality change   Ace Inhibitors Other (See Comments)   Unknown    Chantix [varenicline] Other (See Comments)   Nightmares    Pollen Extract Other (See Comments)   Unknown         Medication List     TAKE these medications    acetaminophen  500 MG tablet Commonly known as: TYLENOL  Take 1,000 mg by mouth every 6 (  six) hours as needed for moderate pain or headache.   albuterol  108 (90 Base) MCG/ACT inhaler Commonly known as: VENTOLIN  HFA Inhale 2 puffs into the lungs every 6 (six) hours as needed for wheezing or shortness of breath.   ALPRAZolam  1 MG tablet Commonly known as: XANAX  Take 1 mg by mouth 2 (two) times daily as needed for anxiety or sleep.   atorvastatin  80 MG tablet Commonly known as: LIPITOR  Take 1 tablet  (80 mg total) by mouth daily.   baclofen  20 MG tablet Commonly known as: LIORESAL  Take 20 mg by mouth 2 (two) times daily.   carvedilol  6.25 MG tablet Commonly known as: COREG  take 1 tablet (6.25 MILLIGRAM total) by mouth 2 (two) times daily. What changed: See the new instructions.   cephALEXin  750 MG capsule Commonly known as: Keflex  Take 1 capsule (750 mg total) by mouth 4 (four) times daily for 10 days.   clopidogrel  75 MG tablet Commonly known as: PLAVIX  Take 75 mg by mouth daily.   Cosentyx UnoReady 300 MG/2ML Soaj Generic drug: Secukinumab Inject 300 mg into the skin every 28 (twenty-eight) days.   dapsone  25 MG tablet Take 25 mg by mouth 2 (two) times daily.   DULoxetine  60 MG capsule Commonly known as: CYMBALTA  Take 1 capsule (60 mg total) by mouth daily. What changed: when to take this   Entresto  49-51 MG Generic drug: sacubitril -valsartan  Take 1 tablet by mouth 2 (two) times daily.   ezetimibe  10 MG tablet Commonly known as: ZETIA  take 1 tablet (10 MILLIGRAM total) by mouth daily. What changed: See the new instructions.   furosemide  20 MG tablet Commonly known as: LASIX  Take 20 mg by mouth daily.   isosorbide  mononitrate 30 MG 24 hr tablet Commonly known as: IMDUR  Take 30 mg by mouth daily.   midodrine  10 MG tablet Commonly known as: PROAMATINE  Take 10 mg by mouth 2 (two) times daily.   naloxone 4 MG/0.1ML Liqd nasal spray kit Commonly known as: NARCAN Place 1 spray into the nose as needed (opioid overdose).   nitroGLYCERIN  0.4 MG SL tablet Commonly known as: NITROSTAT  Place 0.4 mg under the tongue every 5 (five) minutes as needed for chest pain.   ondansetron  8 MG tablet Commonly known as: ZOFRAN  Take 8 mg by mouth every 8 (eight) hours as needed for vomiting or nausea.   oxyCODONE -acetaminophen  10-325 MG tablet Commonly known as: PERCOCET Take 1 tablet by mouth every 8 (eight) hours as needed for pain.   Ozempic (1 MG/DOSE) 4 MG/3ML  Sopn Generic drug: Semaglutide (1 MG/DOSE) Inject 1 mg into the skin every Monday.   pregabalin  200 MG capsule Commonly known as: Lyrica  Take 1 capsule (200 mg total) by mouth 2 (two) times daily.   triamcinolone  cream 0.1 % Commonly known as: KENALOG  Apply 1 Application topically 2 (two) times daily.   Vitamin D 50 MCG (2000 UT) Caps Take 1 capsule by mouth daily.               Discharge Care Instructions  (From admission, onward)           Start     Ordered   03/10/24 0000  Discharge wound care:       Comments: Daily      Comments: Cleanse L lateral ankle wound with NS, apply Medihoney to wound bed daily, cover with dry gauze and secure with Kerlix roll gauze anchored around foot   03/10/24 1623  Follow-up Information     Toribio Jerel MATSU, MD. Schedule an appointment as soon as possible for a visit in 1 week(s).   Specialty: Family Medicine Contact information: [address] [place] [address] [phone]                Allergies  Allergen Reactions   Ibuprofen Anaphylaxis, Hives and Swelling   Iodinated Contrast Media Anaphylaxis, Shortness Of Breath, Itching, Swelling and Rash    Isovue contrast is most acceptable agent based on previous experience and testing with premedications Throat swells up   Nsaids Anaphylaxis, Hives, Swelling, Dermatitis and Rash    Can take Aspirin  325 mg or lower   Other Anaphylaxis and Other (See Comments)    Anti-psychotics Personality change   Ace Inhibitors Other (See Comments)    Unknown    Chantix [Varenicline] Other (See Comments)    Nightmares    Pollen Extract Other (See Comments)    Unknown     Consultations: None   Procedures/Studies: US  Venous Img Lower Unilateral Left (DVT) Result Date: 03/10/2024 CLINICAL DATA:  Redness and swelling of the left calf EXAM: LEFT LOWER EXTREMITY VENOUS DOPPLER ULTRASOUND TECHNIQUE: Gray-scale sonography with compression, as well as color and duplex  ultrasound, were performed to evaluate the deep venous system(s) from the level of the common femoral vein through the popliteal and proximal calf veins. COMPARISON:  None Available. FINDINGS: VENOUS Normal compressibility of the common femoral, superficial femoral, and popliteal veins, as well as the visualized calf veins. Visualized portions of profunda femoral vein and great saphenous vein unremarkable. No filling defects to suggest DVT on grayscale or color Doppler imaging. Doppler waveforms show normal direction of venous flow, normal respiratory plasticity and response to augmentation. Limited views of the contralateral common femoral vein are unremarkable. OTHER Superficial subcutaneous edema. Limitations: none IMPRESSION: 1. No evidence of deep or superficial venous thrombosis. 2. Positive for superficial subcutaneous fluid which may reflect edema or cellulitis. Electronically Signed   By: Wilkie Lent M.D.   On: 03/10/2024 09:57   DG Ankle Complete Left Result Date: 03/09/2024 CLINICAL DATA:  pain, swelling , non healing wound EXAM: LEFT ANKLE COMPLETE - 3+ VIEW; LEFT FOOT - COMPLETE 3+ VIEW COMPARISON:  None Available. FINDINGS: There is no evidence of fracture, dislocation, or joint effusion. Old healed medial malleolar fracture. There is no evidence of arthropathy or other focal bone abnormality. Posterior and plantar calcaneal spur. Ankle subcutaneus soft tissue edema. Vascular calcification. IMPRESSION: No acute displaced fracture or dislocation of the left ankle and foot. Electronically Signed   By: Morgane  Naveau M.D.   On: 03/09/2024 19:02   DG Foot Complete Left Result Date: 03/09/2024 CLINICAL DATA:  pain, swelling , non healing wound EXAM: LEFT ANKLE COMPLETE - 3+ VIEW; LEFT FOOT - COMPLETE 3+ VIEW COMPARISON:  None Available. FINDINGS: There is no evidence of fracture, dislocation, or joint effusion. Old healed medial malleolar fracture. There is no evidence of arthropathy or other  focal bone abnormality. Posterior and plantar calcaneal spur. Ankle subcutaneus soft tissue edema. Vascular calcification. IMPRESSION: No acute displaced fracture or dislocation of the left ankle and foot. Electronically Signed   By: Morgane  Naveau M.D.   On: 03/09/2024 19:02     Discharge Exam: Vitals:   03/10/24 0746 03/10/24 1324  BP: (!) 114/56 121/60  Pulse: 63 62  Resp: 16 18  Temp: 97.7 F (36.5 C) 98 F (36.7 C)  SpO2: 96% 97%   Vitals:   03/10/24  0051 03/10/24 0429 03/10/24 0746 03/10/24 1324  BP: (!) 112/94 112/61 (!) 114/56 121/60  Pulse: 70 62 63 62  Resp: 20 18 16 18   Temp: 98.5 F (36.9 C) 97.9 F (36.6 C) 97.7 F (36.5 C) 98 F (36.7 C)  TempSrc: Oral Oral Oral Oral  SpO2: 94% 93% 96% 97%  Weight:      Height:        General: Pt is alert, awake, not in acute distress Cardiovascular: RRR, S1/S2 +, no rubs, no gallops Respiratory: CTA bilaterally, no wheezing, no rhonchi Abdominal: Soft, NT, ND, bowel sounds + Extremities: no edema, no cyanosis    The results of significant diagnostics from this hospitalization (including imaging, microbiology, ancillary and laboratory) are listed below for reference.     Microbiology: Recent Results (from the past 240 hours)  MRSA Next Gen by PCR, Nasal     Status: None   Collection Time: 03/10/24 11:46 AM   Specimen: Nasal Mucosa; Nasal Swab  Result Value Ref Range Status   MRSA by PCR Next Gen NOT DETECTED NOT DETECTED Final    Comment: (NOTE) The GeneXpert MRSA Assay (FDA approved for NASAL specimens only), is one component of a comprehensive MRSA colonization surveillance program. It is not intended to diagnose MRSA infection nor to guide or monitor treatment for MRSA infections. Test performance is not FDA approved in patients less than 64 years old. Performed at [facility], [address], [place], [place] [address]      Labs: BNP (last 3 results) No results for input(s): BNP in the last 8760  hours. Basic Metabolic Panel: Recent Labs  Lab 03/09/24 1632 03/10/24 0420  NA 142 142  K 3.6 3.5  CL 107 108  CO2 24 26  GLUCOSE 99 90  BUN 24* 20  CREATININE 1.19 1.14  CALCIUM  9.1 8.5*  MG  --  1.9  PHOS  --  3.1   Liver Function Tests: Recent Labs  Lab 03/10/24 0420  AST 17  ALT 18  ALKPHOS 62  BILITOT 0.6  PROT 5.8*  ALBUMIN  2.9*   No results for input(s): LIPASE, AMYLASE in the last 168 hours. No results for input(s): AMMONIA in the last 168 hours. CBC: Recent Labs  Lab 03/09/24 1632 03/10/24 0420  WBC 9.4 8.7  NEUTROABS 5.3  --   HGB 14.8 13.5  HCT 46.5 43.0  MCV 94.5 93.9  PLT 201 170   Cardiac Enzymes: No results for input(s): CKTOTAL, CKMB, CKMBINDEX, TROPONINI in the last 168 hours. BNP: Invalid input(s): POCBNP CBG: Recent Labs  Lab 03/09/24 2227 03/10/24 0729 03/10/24 1134  GLUCAP 77 86 81   D-Dimer No results for input(s): DDIMER in the last 72 hours. Hgb A1c No results for input(s): HGBA1C in the last 72 hours. Lipid Profile No results for input(s): CHOL, HDL, LDLCALC, TRIG, CHOLHDL, LDLDIRECT in the last 72 hours. Thyroid  function studies No results for input(s): TSH, T4TOTAL, T3FREE, THYROIDAB in the last 72 hours.  Invalid input(s): FREET3 Anemia work up No results for input(s): VITAMINB12, FOLATE, FERRITIN, TIBC, IRON, RETICCTPCT in the last 72 hours. Urinalysis    Component Value Date/Time   COLORURINE YELLOW 06/21/2013 2323   APPEARANCEUR Clear 09/29/2020 1324   LABSPEC 1.020 06/21/2013 2323   PHURINE 7.0 06/21/2013 2323   GLUCOSEU Negative 09/29/2020 1324   HGBUR TRACE (A) 06/21/2013 2323   BILIRUBINUR Negative 09/29/2020 1324   KETONESUR NEGATIVE 06/21/2013 2323   PROTEINUR Trace (A) 09/29/2020 1324   PROTEINUR NEGATIVE  06/21/2013 2323   UROBILINOGEN 0.2 06/21/2013 2323   NITRITE Negative 09/29/2020 1324   NITRITE NEGATIVE 06/21/2013 2323   LEUKOCYTESUR  Negative 09/29/2020 1324   Sepsis Labs Recent Labs  Lab 03/09/24 1632 03/10/24 0420  WBC 9.4 8.7   Microbiology Recent Results (from the past 240 hours)  MRSA Next Gen by PCR, Nasal     Status: None   Collection Time: 03/10/24 11:46 AM   Specimen: Nasal Mucosa; Nasal Swab  Result Value Ref Range Status   MRSA by PCR Next Gen NOT DETECTED NOT DETECTED Final    Comment: (NOTE) The GeneXpert MRSA Assay (FDA approved for NASAL specimens only), is one component of a comprehensive MRSA colonization surveillance program. It is not intended to diagnose MRSA infection nor to guide or monitor treatment for MRSA infections. Test performance is not FDA approved in patients less than 39 years old. Performed at [facility], [address], [place], [place] [address]      Time coordinating discharge: 35 minutes  SIGNED:   Adron JONETTA Fairly, DO [facility] 03/10/2024, 4:24 PM  If 7PM-7AM, please contact night-coverage [email or web address]

## 2024-03-10 NOTE — Progress Notes (Signed)
 The beneficiary is confined to a single room and will need a bedside commode to safely return home at time of hospital discharge.

## 2024-03-10 NOTE — Progress Notes (Signed)
 PROGRESS NOTE    [name]  FMW:[id number] DOB: [birth year] DOA: 03/09/2024 PCP: Toribio Jerel KANDICE, MD   Brief Narrative:    [name] is a 58 y.o. 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  He endorsed accidentally striking his left ankle on his wheelchair a few months ago that resulted in an open wound, he has been following up with wound therapy and PT, but the wound was not healing.  He noted several days of worsening and increased pain and swelling to left lower leg without fever, he states he completed a 10-day antibiotic (thought to be doxycycline) regimen without improvement, so he presented to the ED for further evaluation and management.   Patient has been admitted for cellulitis of the left lower extremity along with open wound and was started on Ancef .  Ultrasound study for DVT negative.  Assessment & Plan:   Principal Problem:   Cellulitis of left lower extremity Active Problems:   Mixed hyperlipidemia   History of CVA (cerebrovascular accident)   COPD (chronic obstructive pulmonary disease) (HCC)   Type 2 diabetes mellitus (HCC)   Open wound of left ankle   Localized swelling of left lower extremity   Chronic combined systolic and diastolic CHF (congestive heart failure) (HCC)   History of hypertension  Assessment and Plan:   Cellulitis of left lower extremity Patient was started on Ancef , continue with same at this time Continue IV Dilaudid  0.5 every 4 hours as needed for moderate and severe pain No blood cultures appear to have been obtained   Open wound of left ankle Continue wound care Continue Dilaudid  as described above Continue PT/OT eval and treat, wheelchair-bound at baseline   Left lower extremity swelling No  DVT noted on venous ultrasound 8/27   History of CVA Continue Lipitor , clopidogrel    Combined systolic and diastolic CHF Home meds temporarily held due to soft BP   History of hypertension Consider resuming home meds when BP improves   Mixed hyperlipidemia Continue Lipitor , Zetia    T2DM Continue ISS and hypoglycemia protocol   COPD (not in acute exacerbation) Continue albuterol    DVT prophylaxis:Lovenox  Code Status: Full Family Communication: None at bedside Disposition Plan:  Status is: Inpatient Remains inpatient appropriate because: Need for IV medications.   Consultants:  None  Procedures:  None  Antimicrobials:  Anti-infectives (From admission, onward)    Start     Dose/Rate Route Frequency Ordered Stop   03/09/24 2200  ceFAZolin  (ANCEF ) IVPB 2g/100 mL premix        2 g 200 mL/hr over 30 Minutes Intravenous Every 8 hours 03/09/24 2034 03/17/24 0159   03/09/24 1945  ceFAZolin  (ANCEF ) IVPB 2g/100 mL premix        2 g 200 mL/hr over 30 Minutes Intravenous  Once 03/09/24 1930 03/09/24 2310       Subjective: Patient seen and evaluated today with ongoing left lower extremity pain noted.  Denies any other concerns.  Objective: Vitals:   03/10/24 0039 03/10/24 0051 03/10/24 0429 03/10/24 0746  BP: 110/78 (!) 112/94 112/61 (!) 114/56  Pulse: 70 70 62 63  Resp: 18 20 18 16   Temp:  98.5 F (36.9 C) 97.9 F (36.6 C) 97.7 F (36.5 C)  TempSrc:  Oral Oral Oral  SpO2: 100%  94% 93% 96%  Weight:      Height:        Intake/Output Summary (Last 24 hours) at 03/10/2024 1019 Last data filed at 03/10/2024 0850 Gross per 24 hour  Intake 312.27 ml  Output 400 ml  Net -87.73 ml   Filed Weights   03/09/24 1549  Weight: 96.6 kg    Examination:  General exam: Appears calm and comfortable  Respiratory system: Clear to auscultation. Respiratory effort normal. Cardiovascular system: S1 & S2 heard, RRR.  Gastrointestinal system: Abdomen is soft Central nervous  system: Alert and awake Extremities:   Skin: No significant lesions noted Psychiatry: Flat affect.    Data Reviewed: I have personally reviewed following labs and imaging studies  CBC: Recent Labs  Lab 03/09/24 1632 03/10/24 0420  WBC 9.4 8.7  NEUTROABS 5.3  --   HGB 14.8 13.5  HCT 46.5 43.0  MCV 94.5 93.9  PLT 201 170   Basic Metabolic Panel: Recent Labs  Lab 03/09/24 1632 03/10/24 0420  NA 142 142  K 3.6 3.5  CL 107 108  CO2 24 26  GLUCOSE 99 90  BUN 24* 20  CREATININE 1.19 1.14  CALCIUM  9.1 8.5*  MG  --  1.9  PHOS  --  3.1   GFR: Estimated Creatinine Clearance: 86.8 mL/min (by C-G formula based on SCr of 1.14 mg/dL). Liver Function Tests: Recent Labs  Lab 03/10/24 0420  AST 17  ALT 18  ALKPHOS 62  BILITOT 0.6  PROT 5.8*  ALBUMIN  2.9*   No results for input(s): LIPASE, AMYLASE in the last 168 hours. No results for input(s): AMMONIA in the last 168 hours. Coagulation Profile: No results for input(s): INR, PROTIME in the last 168 hours. Cardiac Enzymes: No results for input(s): CKTOTAL, CKMB, CKMBINDEX, TROPONINI in the last 168 hours. BNP (last 3 results) No results for input(s): PROBNP in the last 8760 hours. HbA1C: No results for input(s): HGBA1C in the last 72 hours. CBG: Recent Labs  Lab 03/09/24 2227 03/10/24 0729  GLUCAP 77 86   Lipid Profile: No results for input(s): CHOL, HDL, LDLCALC, TRIG, CHOLHDL, LDLDIRECT in the last 72 hours. Thyroid  Function Tests: No results for input(s): TSH, T4TOTAL, FREET4, T3FREE, THYROIDAB in the last 72 hours. Anemia Panel: No results for input(s): VITAMINB12, FOLATE, FERRITIN, TIBC, IRON, RETICCTPCT in the last 72 hours. Sepsis Labs: No results for input(s): PROCALCITON, LATICACIDVEN in the last 168 hours.  No results found for this or any previous visit (from the past 240 hours).       Radiology Studies: US  Venous Img Lower  Unilateral Left (DVT) Result Date: 03/10/2024 CLINICAL DATA:  Redness and swelling of the left calf EXAM: LEFT LOWER EXTREMITY VENOUS DOPPLER ULTRASOUND TECHNIQUE: Gray-scale sonography with compression, as well as color and duplex ultrasound, were performed to evaluate the deep venous system(s) from the level of the common femoral vein through the popliteal and proximal calf veins. COMPARISON:  None Available. FINDINGS: VENOUS Normal compressibility of the common femoral, superficial femoral, and popliteal veins, as well as the visualized calf veins. Visualized portions of profunda femoral vein and great saphenous vein unremarkable. No filling defects to suggest DVT on grayscale or color Doppler imaging. Doppler waveforms show normal direction of venous flow, normal respiratory plasticity and response to augmentation. Limited views of the contralateral common femoral vein are unremarkable. OTHER Superficial subcutaneous edema. Limitations: none IMPRESSION: 1. No evidence of deep or superficial venous thrombosis. 2. Positive for superficial subcutaneous fluid which may reflect  edema or cellulitis. Electronically Signed   By: Wilkie Lent M.D.   On: 03/10/2024 09:57   DG Ankle Complete Left Result Date: 03/09/2024 CLINICAL DATA:  pain, swelling , non healing wound EXAM: LEFT ANKLE COMPLETE - 3+ VIEW; LEFT FOOT - COMPLETE 3+ VIEW COMPARISON:  None Available. FINDINGS: There is no evidence of fracture, dislocation, or joint effusion. Old healed medial malleolar fracture. There is no evidence of arthropathy or other focal bone abnormality. Posterior and plantar calcaneal spur. Ankle subcutaneus soft tissue edema. Vascular calcification. IMPRESSION: No acute displaced fracture or dislocation of the left ankle and foot. Electronically Signed   By: Morgane  Naveau M.D.   On: 03/09/2024 19:02   DG Foot Complete Left Result Date: 03/09/2024 CLINICAL DATA:  pain, swelling , non healing wound EXAM: LEFT ANKLE  COMPLETE - 3+ VIEW; LEFT FOOT - COMPLETE 3+ VIEW COMPARISON:  None Available. FINDINGS: There is no evidence of fracture, dislocation, or joint effusion. Old healed medial malleolar fracture. There is no evidence of arthropathy or other focal bone abnormality. Posterior and plantar calcaneal spur. Ankle subcutaneus soft tissue edema. Vascular calcification. IMPRESSION: No acute displaced fracture or dislocation of the left ankle and foot. Electronically Signed   By: Morgane  Naveau M.D.   On: 03/09/2024 19:02        Scheduled Meds:  atorvastatin   80 mg Oral Daily   clopidogrel   75 mg Oral Daily   enoxaparin  (LOVENOX ) injection  40 mg Subcutaneous Q24H   ezetimibe   10 mg Oral Daily   insulin  aspart  0-15 Units Subcutaneous TID WC   leptospermum manuka honey  1 Application Topical Daily   midodrine   10 mg Oral BID WC   nystatin    Topical BID   Continuous Infusions:   ceFAZolin  (ANCEF ) IV 2 g (03/10/24 0957)     LOS: 1 day    Time spent: 55 minutes    Geroge Gilliam D Maree, DO [facility]  If 7PM-7AM, please contact night-coverage [email or web address] 03/10/2024, 10:19 AM

## 2024-03-10 NOTE — Care Management Obs Status (Signed)
 MEDICARE OBSERVATION STATUS NOTIFICATION   Patient Details  Name: [name] MRN: [id number] Date of Birth: January 29, 1966   Medicare Observation Status Notification Given:  Yes    [name], [place] 03/10/2024, 4:48 PM

## 2024-03-10 NOTE — Plan of Care (Signed)
  Problem: Acute Rehab OT Goals (only OT should resolve) Goal: Pt. Will Perform Grooming Flowsheets (Taken 03/10/2024 1144) Pt Will Perform Grooming:  with modified independence  sitting Goal: Pt. Will Perform Upper Body Dressing Flowsheets (Taken 03/10/2024 1144) Pt Will Perform Upper Body Dressing:  with set-up  sitting  with adaptive equipment Goal: Pt. Will Perform Lower Body Dressing Flowsheets (Taken 03/10/2024 1144) Pt Will Perform Lower Body Dressing:  with mod assist  sitting/lateral leans Goal: Pt. Will Transfer To Toilet Flowsheets (Taken 03/10/2024 1144) Pt Will Transfer to Toilet:  with contact guard assist  with supervision  stand pivot transfer Goal: Pt. Will Perform Toileting-Clothing Manipulation Flowsheets (Taken 03/10/2024 1144) Pt Will Perform Toileting - Clothing Manipulation and hygiene:  with modified independence  sitting/lateral leans Goal: Pt/Caregiver Will Perform Home Exercise Program Flowsheets (Taken 03/10/2024 1144) Pt/caregiver will Perform Home Exercise Program:  Increased strength  Increased ROM  Right Upper extremity  Left upper extremity  With minimal assist  Jazalynn Mireles OT, MOT

## 2024-03-10 NOTE — Evaluation (Signed)
 Physical Therapy Evaluation Patient Details Name: [name] MRN: [id number] DOB: 01/06/1966 Today's Date: 03/10/2024  History of Present Illness  [name] is a 58 y.o. 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  He endorsed accidentally striking his left ankle on his wheelchair a few months ago that resulted in an open wound, he has been following up with wound therapy and PT, but the wound was not healing.  He noted several days of worsening and increased pain and swelling to left lower leg without fever, he states he completed a 10-day antibiotic (thought to be doxycycline) regimen without improvement, so he presented to the ED for further evaluation and management.  Patient denies fever, chills chest pain, shortness of breath.   Clinical Impression  Patient demonstrates slow labored movement for sitting up at bedside, has most difficulty completing sit to stands from bedside due to weakness, limited to a few slow labored side steps before having to sit due to fatigue, BLE weakness and fall risk. Patient tolerated sitting up in chair after therapy.  Patient will benefit from continued skilled physical therapy in hospital and recommended venue below to increase strength, balance, endurance for safe ADLs and gait.           If plan is discharge home, recommend the following: A lot of help with bathing/dressing/bathroom;A lot of help with walking and/or transfers;Help with stairs or ramp for entrance;Assistance with cooking/housework   Can travel by private vehicle        Equipment Recommendations None recommended by PT  Recommendations for Other Services       Functional Status Assessment Patient has had a recent decline in their functional  status and demonstrates the ability to make significant improvements in function in a reasonable and predictable amount of time.     Precautions / Restrictions Precautions Precautions: Fall Recall of Precautions/Restrictions: Intact Restrictions Weight Bearing Restrictions Per Provider Order: No      Mobility  Bed Mobility Overal bed mobility: Needs Assistance Bed Mobility: Supine to Sit, Sit to Supine     Supine to sit: Mod assist, HOB elevated Sit to supine: Mod assist   General bed mobility comments: slow labored movement    Transfers Overall transfer level: Needs assistance Equipment used: Hemi-walker Transfers: Sit to/from Stand, Bed to chair/wheelchair/BSC Sit to Stand: Mod assist, Max assist   Step pivot transfers: Mod assist       General transfer comment: has most difficulty completing sit to stands from bedside due to weakness    Ambulation/Gait Ambulation/Gait assistance: Mod assist, Max assist Gait Distance (Feet): 5 Feet Assistive device: Hemi-walker Gait Pattern/deviations: Decreased step length - right, Decreased step length - left, Decreased stance time - left, Decreased stride length, Shuffle Gait velocity: slow     General Gait Details: limited to a few slow labored unsteady side steps with occasional shuffling for LLE due to weakness requring tactile assistance to move LLE  Stairs            Wheelchair Mobility     Tilt Bed    Modified Rankin (Stroke Patients Only)       Balance Overall balance assessment: Needs assistance Sitting-balance support: Feet supported, No upper extremity supported Sitting balance-Leahy Scale: Fair Sitting balance - Comments: fair to good seated  at EOB   Standing balance support: Single extremity supported, During functional activity, Reliant on assistive device for balance Standing balance-Leahy Scale: Poor Standing balance comment: using hemi-walker                              Pertinent Vitals/Pain Pain Assessment Pain Assessment: No/denies pain    Home Living Family/patient expects to be discharged to:: Private residence Living Arrangements: Alone Available Help at Discharge: Available 24 hours/day;Personal care attendant Type of Home: House Home Access: Ramped entrance       Home Layout: One level Home Equipment: Agricultural consultant (2 wheels);Cane - quad;Shower seat;Grab bars - tub/shower;Lift chair;Wheelchair - Geophysical data processor;Other (comment) Additional Comments: 24/7 assist from care providers    Prior Function Prior Level of Function : Needs assist       Physical Assist : Mobility (physical);ADLs (physical) Mobility (physical): Transfers;Gait;Stairs;Bed mobility ADLs (physical): Bathing;Dressing;IADLs Mobility Comments: Able to ambualte short distances in the house with quad cane and supervision. Uses w/c for mobility as well. Assisted for bed mobility at baseline. ADLs Comments: Assited for bathing, dressing, and IADL's from care provider staff.     Extremity/Trunk Assessment   Upper Extremity Assessment Upper Extremity Assessment: Defer to OT evaluation RUE Deficits / Details: Generally weak. Pt reports being weaker today. LUE Deficits / Details: Not used functionally at baseline. Hemiparesis from and old stroke. No active movement noted today. P/ROM of shoulder limited to <75% of available range for shoulder flexion. Elbow flexion also sliglty limited.    Lower Extremity Assessment Lower Extremity Assessment: Generalized weakness;LLE deficits/detail;RLE deficits/detail RLE Deficits / Details: grossly -4/5 RLE Sensation: WNL RLE Coordination: WNL LLE Deficits / Details: grossly -3/5 (due to old CVA) LLE Sensation: decreased light touch;decreased proprioception LLE Coordination: decreased fine motor;decreased gross motor    Cervical / Trunk Assessment Cervical / Trunk Assessment: Kyphotic  Communication    Communication Communication: No apparent difficulties    Cognition Arousal: Alert Behavior During Therapy: WFL for tasks assessed/performed   PT - Cognitive impairments: No apparent impairments                         Following commands: Intact       Cueing Cueing Techniques: Verbal cues, Tactile cues     General Comments      Exercises     Assessment/Plan    PT Assessment Patient needs continued PT services  PT Problem List Decreased strength;Decreased activity tolerance;Decreased balance;Decreased mobility;Decreased coordination;Impaired sensation;Impaired tone       PT Treatment Interventions DME instruction;Gait training;Functional mobility training;Therapeutic activities;Therapeutic exercise;Balance training;Patient/family education    PT Goals (Current goals can be found in the Care Plan section)  Acute Rehab PT Goals Patient Stated Goal: return home with home aides to assist PT Goal Formulation: With patient Time For Goal Achievement: 04/12/24 Potential to Achieve Goals: Good    Frequency Min 3X/week     Co-evaluation PT/OT/SLP Co-Evaluation/Treatment: Yes Reason for Co-Treatment: To address functional/ADL transfers PT goals addressed during session: Mobility/safety with mobility;Balance;Proper use of DME OT goals addressed during session: ADL's and self-care       AM-PAC PT 6 Clicks Mobility  Outcome Measure Help needed turning from your back to your side while in a flat bed without using bedrails?: A Little Help needed moving from lying on your back to sitting on the side of a flat bed without using bedrails?: A Lot Help needed moving  to and from a bed to a chair (including a wheelchair)?: A Lot Help needed standing up from a chair using your arms (e.g., wheelchair or bedside chair)?: A Lot Help needed to walk in hospital room?: A Lot Help needed climbing 3-5 steps with a railing? : Total 6 Click Score: 12    End of Session Equipment  Utilized During Treatment: Gait belt Activity Tolerance: Patient tolerated treatment well;Patient limited by fatigue Patient left: in bed;with call bell/phone within reach Nurse Communication: Mobility status PT Visit Diagnosis: Other abnormalities of gait and mobility (R26.89);Unsteadiness on feet (R26.81);Muscle weakness (generalized) (M62.81);Hemiplegia and hemiparesis Hemiplegia - Right/Left: Left Hemiplegia - dominant/non-dominant: Non-dominant Hemiplegia - caused by: Cerebral infarction    Time: 9052-8980 PT Time Calculation (min) (ACUTE ONLY): 32 min   Charges:   PT Evaluation $PT Eval Moderate Complexity: 1 Mod PT Treatments $Therapeutic Activity: 23-37 mins PT General Charges $$ ACUTE PT VISIT: 1 Visit         2:17 PM, 03/10/24 Lynwood Music, MPT Physical Therapist with [facility] [phone] office [phone] mobile phone

## 2024-03-10 NOTE — Plan of Care (Signed)
  Problem: Acute Rehab PT Goals(only PT should resolve) Goal: Pt Will Go Supine/Side To Sit Outcome: Progressing Flowsheets (Taken 03/10/2024 1419) Pt will go Supine/Side to Sit: with minimal assist Goal: Patient Will Transfer Sit To/From Stand Outcome: Progressing Flowsheets (Taken 03/10/2024 1419) Patient will transfer sit to/from stand: with minimal assist Goal: Pt Will Transfer Bed To Chair/Chair To Bed Outcome: Progressing Flowsheets (Taken 03/10/2024 1419) Pt will Transfer Bed to Chair/Chair to Bed:  with min assist  with mod assist Goal: Pt Will Ambulate Outcome: Progressing Flowsheets (Taken 03/10/2024 1419) Pt will Ambulate:  15 feet  with minimal assist  with moderate assist Note: Hemi-Walker   2:20 PM, 03/10/24 Lynwood Music, MPT Physical Therapist with [facility] [phone] office [phone] mobile phone

## 2024-03-10 NOTE — Evaluation (Signed)
 Occupational Therapy Evaluation Patient Details Name: [name] MRN: [id number] DOB: 11-Dec-1965 Today's Date: 03/10/2024   History of Present Illness   [name] is a 58 y.o. 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  He endorsed accidentally striking his left ankle on his wheelchair a few months ago that resulted in an open wound, he has been following up with wound therapy and PT, but the wound was not healing.  He noted several days of worsening and increased pain and swelling to left lower leg without fever, he states he completed a 10-day antibiotic (thought to be doxycycline) regimen without improvement, so he presented to the ED for further evaluation and management.  Patient denies fever, chills chest pain, shortness of breath. (per DO)     Clinical Impressions Pt agreeable to OT and PT co-evaluation. Pt reports that typically he can ambulate a short distance with only supervision assist using the quad cane or hemi-walker. Today the pt required mod A for bed mobility and mod to max A for EOB to chair transfer with hemi-walker. Pt's L UE is limited at baseline from a previous stroke. R UE weak with pt reported that he feels weaker today. Pt is assisted for bathing, dressing, and IADL's at baseline. Today pt demonstrated need for max to total assist for lower body ADL's and min to mod for UBD and bathing. Pt left in the bed with call bell within reach. Pt will benefit from continued OT in the hospital and recommended venue below to increase strength, balance, and endurance for safe ADL's.        If plan is discharge home, recommend the following:   A lot of help with walking and/or transfers;A lot of help with  bathing/dressing/bathroom;Assistance with cooking/housework;Assist for transportation;Help with stairs or ramp for entrance     Functional Status Assessment   Patient has had a recent decline in their functional status and demonstrates the ability to make significant improvements in function in a reasonable and predictable amount of time.     Equipment Recommendations   BSC/3in1;Other (comment) (Pt could not recall if he had one, if not, it may be beneficial to d/c with one.)             Precautions/Restrictions   Precautions Precautions: Fall Recall of Precautions/Restrictions: Intact Restrictions Weight Bearing Restrictions Per Provider Order: No     Mobility Bed Mobility Overal bed mobility: Needs Assistance Bed Mobility: Supine to Sit, Sit to Supine     Supine to sit: Mod assist, HOB elevated Sit to supine: Mod assist   General bed mobility comments: labored movement; assist to move L LE out of and into bed. Single hand held assist to pull to sit as well.    Transfers Overall transfer level: Needs assistance Equipment used: Hemi-walker Transfers: Sit to/from Stand, Bed to chair/wheelchair/BSC Sit to Stand: Mod assist, Max assist     Step pivot transfers: Mod assist     General transfer comment: Mod to max from EOB for sit to stand. More mod A for sit to stand from chair per PT. Labored movement and using hemi-walker to keep weight off of L LE.      Balance Overall balance assessment: Needs assistance Sitting-balance support: No upper extremity supported, Feet supported Sitting balance-Leahy  Scale: Fair Sitting balance - Comments: fair to good seated at EOB   Standing balance support: Single extremity supported, During functional activity, Reliant on assistive device for balance Standing balance-Leahy Scale: Poor Standing balance comment: using hemi-walker                           ADL either performed or assessed with clinical judgement    ADL Overall ADL's : Needs assistance/impaired Eating/Feeding: Set up;Sitting   Grooming: Set up;Sitting   Upper Body Bathing: Sitting;Moderate assistance;Minimal assistance   Lower Body Bathing: Maximal assistance;Total assistance;Sitting/lateral leans   Upper Body Dressing : Minimal assistance;Moderate assistance;Sitting   Lower Body Dressing: Maximal assistance;Total assistance;Bed level Lower Body Dressing Details (indicate cue type and reason): Assisted to don socks at bed level; assisted at baseline as well. Toilet Transfer: Moderate assistance;Maximal assistance;Stand-pivot (hemi-walker) Toilet Transfer Details (indicate cue type and reason): EOB to chair and back Toileting- Clothing Manipulation and Hygiene: Moderate assistance;Sitting/lateral lean               Vision Baseline Vision/History: 1 Wears glasses Ability to See in Adequate Light: 1 Impaired Patient Visual Report: No change from baseline Vision Assessment?: No apparent visual deficits     Perception Perception: Not tested       Praxis Praxis: Not tested       Pertinent Vitals/Pain Pain Assessment Pain Assessment: No/denies pain     Extremity/Trunk Assessment Upper Extremity Assessment Upper Extremity Assessment: Right hand dominant;RUE deficits/detail;LUE deficits/detail RUE Deficits / Details: Generally weak. Pt reports being weaker today. LUE Deficits / Details: Not used functionally at baseline. Hemiparesis from and old stroke. No active movement noted today. P/ROM of shoulder limited to <75% of available range for shoulder flexion. Elbow flexion also sliglty limited.   Lower Extremity Assessment Lower Extremity Assessment: Defer to PT evaluation   Cervical / Trunk Assessment Cervical / Trunk Assessment: Kyphotic   Communication Communication Communication: No apparent difficulties   Cognition Arousal: Alert Behavior During Therapy: WFL for tasks assessed/performed Cognition: No  apparent impairments                               Following commands: Intact       Cueing  General Comments   Cueing Techniques: Verbal cues;Tactile cues                 Home Living Family/patient expects to be discharged to:: Private residence Living Arrangements: Alone Available Help at Discharge: Available 24 hours/day;Personal care attendant Type of Home: House Home Access: Ramped entrance     Home Layout: One level     Bathroom Shower/Tub: Producer, television/film/video: Handicapped height (Pt reports having a lift chair over the toilet.) Bathroom Accessibility: Yes How Accessible: Accessible via wheelchair;Accessible via walker Home Equipment: Rolling Walker (2 wheels);Cane - quad;Shower seat;Grab bars - tub/shower;Lift chair;Wheelchair - Geophysical data processor;Other (comment) (hemi walker)   Additional Comments: 24/7 assist from care providers      Prior Functioning/Environment Prior Level of Function : Needs assist       Physical Assist : Mobility (physical);ADLs (physical) Mobility (physical): Transfers;Gait;Stairs;Bed mobility ADLs (physical): Bathing;Dressing;IADLs Mobility Comments: Able to ambualte short distances in the house with quad cane and supervision. Uses w/c for mobility as well. Assisted for bed mobility at baseline. ADLs Comments: Assited for bathing, dressing, and IADL's from care provider staff.    OT Problem List:  Decreased strength;Decreased range of motion;Decreased activity tolerance;Impaired balance (sitting and/or standing);Impaired UE functional use   OT Treatment/Interventions: Self-care/ADL training;Therapeutic exercise;DME and/or AE instruction;Therapeutic activities;Balance training;Patient/family education;Energy conservation      OT Goals(Current goals can be found in the care plan section)   Acute Rehab OT Goals Patient Stated Goal: return home OT Goal Formulation: With patient Time For Goal  Achievement: 03/24/24 Potential to Achieve Goals: Good   OT Frequency:  Min 3X/week    Co-evaluation PT/OT/SLP Co-Evaluation/Treatment: Yes Reason for Co-Treatment: To address functional/ADL transfers   OT goals addressed during session: ADL's and self-care                       End of Session Equipment Utilized During Treatment: Other (comment) (Hemi walker)  Activity Tolerance: Patient tolerated treatment well Patient left: in bed;with call bell/phone within reach  OT Visit Diagnosis: Unsteadiness on feet (R26.81);Other abnormalities of gait and mobility (R26.89);Muscle weakness (generalized) (M62.81)                Time: 9041-8980 OT Time Calculation (min): 21 min Charges:  OT General Charges $OT Visit: 1 Visit OT Evaluation $OT Eval Low Complexity: 1 Low  Kriti Katayama OT, MOT  Jayson Person 03/10/2024, 11:41 AM

## 2024-03-10 NOTE — Consult Note (Signed)
 WOC Nurse Consult Note: L lateral ankle wound being followed by outpatient PT for wound care; utilizing Medihoney  Reason for Consult: L ankle wound  Wound type: full thickness L lateral malleolus r/t trauma per patient (struck on wheelchair)  Pressure Injury POA: NA  Measurement: see nursing flowsheet  Wound bed:75% pink 25% yellow  Drainage (amount, consistency, odor) no purulent drainage mentioned in MD note  Periwound:erythema and edema of foot  Dressing procedure/placement/frequency: Cleanse L lateral ankle wound with NS, apply Medihoney to wound bed daily, cover with dry gauze and secure with Kerlix roll gauze anchored around foot.    Patient should continue to follow with outpatient PT for ongoing wound care.   POC discussed with bedside nurse. WOC team will not follow. Re-consult if further needs arise.   Thank you,    Powell Bar MSN, RN-BC, [employer]

## 2024-03-11 ENCOUNTER — Ambulatory Visit (HOSPITAL_COMMUNITY): Admitting: Physical Therapy

## 2024-03-11 LAB — HEMOGLOBIN A1C
Hgb A1c MFr Bld: 4.8 % (ref 4.8–5.6)
Mean Plasma Glucose: 91 mg/dL

## 2024-03-16 ENCOUNTER — Encounter (HOSPITAL_COMMUNITY): Payer: Self-pay | Admitting: Physical Therapy

## 2024-03-16 ENCOUNTER — Ambulatory Visit (HOSPITAL_COMMUNITY): Admitting: Physical Therapy

## 2024-03-16 DIAGNOSIS — L732 Hidradenitis suppurativa: Secondary | ICD-10-CM | POA: Diagnosis not present

## 2024-03-16 DIAGNOSIS — Z5181 Encounter for therapeutic drug level monitoring: Secondary | ICD-10-CM | POA: Diagnosis not present

## 2024-03-16 NOTE — Therapy (Signed)
 [facility] at [facility] [address] [place], [place], [address] Phone: [phone]   Fax:  [phone]  Patient Details  Name: DERRIAN POLI MRN: [id number] Date of Birth: 1965-11-30 Referring Provider:  No ref. provider found  Encounter Date: 03/16/2024   PHYSICAL THERAPY DISCHARGE SUMMARY  Visits from Start of Care: 3  Current functional level related to goals / functional outcomes: Therapist recommended pt going to ER.  LE continues to be red, swollen and painful despite oral antibiotics.  PT received IV antibiotics at hospital followed by discharge for [facility]; will discharge OP care.    Remaining deficits: Open non healing wound    Education / Equipment: Signs of cellulitis.    Patient agrees to discharge. Patient goals were not met. Patient is being discharged due to did not respond to therapy.   Montie Metro, PT CLT [phone]  03/16/2024, 9:42 AM  [facility] at [facility] [address] [place], [place], [address] Phone: [phone]   Fax:  [phone]

## 2024-03-18 ENCOUNTER — Encounter: Payer: Self-pay | Admitting: Physical Medicine & Rehabilitation

## 2024-03-18 ENCOUNTER — Ambulatory Visit (HOSPITAL_COMMUNITY): Admitting: Physical Therapy

## 2024-03-18 ENCOUNTER — Encounter: Attending: Physical Medicine & Rehabilitation | Admitting: Physical Medicine & Rehabilitation

## 2024-03-18 VITALS — BP 98/65 | HR 69 | Ht 71.0 in | Wt 213.0 lb

## 2024-03-18 DIAGNOSIS — G8114 Spastic hemiplegia affecting left nondominant side: Secondary | ICD-10-CM | POA: Insufficient documentation

## 2024-03-18 DIAGNOSIS — G8110 Spastic hemiplegia affecting unspecified side: Secondary | ICD-10-CM | POA: Insufficient documentation

## 2024-03-18 MED ORDER — SODIUM CHLORIDE (PF) 0.9 % IJ SOLN
5.0000 mL | Freq: Once | INTRAMUSCULAR | Status: AC
  Administered 2024-03-18: 5 mL via INTRAVENOUS

## 2024-03-18 MED ORDER — ABOBOTULINUMTOXINA 500 UNITS IM SOLR
500.0000 [IU] | Freq: Once | INTRAMUSCULAR | Status: AC
  Administered 2024-03-18: 500 [IU] via INTRAMUSCULAR

## 2024-03-18 NOTE — Progress Notes (Signed)
 Dysport  Injection for spasticity using needle EMG guidance  Dilution: 200 Units/ml Indication: Severe spasticity which interferes with ADL,mobility and/or  hygiene and is unresponsive to medication management and other conservative care Informed consent was obtained after describing risks and benefits of the procedure with the patient. This includes bleeding, bruising, infection, excessive weakness, or medication side effects. A REMS form is on file and signed. Pt on Abx for LLE cellulitis, all injections done proximal to erythematous area on lower leg Needle:  needle electrode Number of units per muscle LUE- 400U total FDS 100-   MAS 0 FDP 100-   MAS 0  FCR 100   MAS 0   Left opponens pollicis 100 MAS 0   LLE 600U total   200U Semitendinosis MAS 3   Tibialis post  200U    med gastroc 100U Lat gastroc 100U MAS 3 All injections were done after obtaining appropriate EMG activity and after negative drawback for blood. The patient tolerated the procedure well. Post procedure instructions were given. A followup appointment was made.

## 2024-03-19 DIAGNOSIS — J449 Chronic obstructive pulmonary disease, unspecified: Secondary | ICD-10-CM | POA: Diagnosis not present

## 2024-03-19 DIAGNOSIS — F331 Major depressive disorder, recurrent, moderate: Secondary | ICD-10-CM | POA: Diagnosis not present

## 2024-03-19 DIAGNOSIS — M21372 Foot drop, left foot: Secondary | ICD-10-CM | POA: Diagnosis not present

## 2024-03-19 DIAGNOSIS — G4733 Obstructive sleep apnea (adult) (pediatric): Secondary | ICD-10-CM | POA: Diagnosis not present

## 2024-03-19 DIAGNOSIS — I130 Hypertensive heart and chronic kidney disease with heart failure and stage 1 through stage 4 chronic kidney disease, or unspecified chronic kidney disease: Secondary | ICD-10-CM | POA: Diagnosis not present

## 2024-03-19 DIAGNOSIS — E1122 Type 2 diabetes mellitus with diabetic chronic kidney disease: Secondary | ICD-10-CM | POA: Diagnosis not present

## 2024-03-19 DIAGNOSIS — I69352 Hemiplegia and hemiparesis following cerebral infarction affecting left dominant side: Secondary | ICD-10-CM | POA: Diagnosis not present

## 2024-03-19 DIAGNOSIS — M1990 Unspecified osteoarthritis, unspecified site: Secondary | ICD-10-CM | POA: Diagnosis not present

## 2024-03-19 DIAGNOSIS — J9611 Chronic respiratory failure with hypoxia: Secondary | ICD-10-CM | POA: Diagnosis not present

## 2024-03-19 DIAGNOSIS — F17210 Nicotine dependence, cigarettes, uncomplicated: Secondary | ICD-10-CM | POA: Diagnosis not present

## 2024-03-19 DIAGNOSIS — I5040 Unspecified combined systolic (congestive) and diastolic (congestive) heart failure: Secondary | ICD-10-CM | POA: Diagnosis not present

## 2024-03-19 DIAGNOSIS — I25119 Atherosclerotic heart disease of native coronary artery with unspecified angina pectoris: Secondary | ICD-10-CM | POA: Diagnosis not present

## 2024-03-19 DIAGNOSIS — N1831 Chronic kidney disease, stage 3a: Secondary | ICD-10-CM | POA: Diagnosis not present

## 2024-03-22 DIAGNOSIS — N1831 Chronic kidney disease, stage 3a: Secondary | ICD-10-CM | POA: Diagnosis not present

## 2024-03-22 DIAGNOSIS — I130 Hypertensive heart and chronic kidney disease with heart failure and stage 1 through stage 4 chronic kidney disease, or unspecified chronic kidney disease: Secondary | ICD-10-CM | POA: Diagnosis not present

## 2024-03-22 DIAGNOSIS — M1990 Unspecified osteoarthritis, unspecified site: Secondary | ICD-10-CM | POA: Diagnosis not present

## 2024-03-22 DIAGNOSIS — I25119 Atherosclerotic heart disease of native coronary artery with unspecified angina pectoris: Secondary | ICD-10-CM | POA: Diagnosis not present

## 2024-03-22 DIAGNOSIS — F331 Major depressive disorder, recurrent, moderate: Secondary | ICD-10-CM | POA: Diagnosis not present

## 2024-03-22 DIAGNOSIS — I69352 Hemiplegia and hemiparesis following cerebral infarction affecting left dominant side: Secondary | ICD-10-CM | POA: Diagnosis not present

## 2024-03-22 DIAGNOSIS — E1122 Type 2 diabetes mellitus with diabetic chronic kidney disease: Secondary | ICD-10-CM | POA: Diagnosis not present

## 2024-03-22 DIAGNOSIS — J449 Chronic obstructive pulmonary disease, unspecified: Secondary | ICD-10-CM | POA: Diagnosis not present

## 2024-03-22 DIAGNOSIS — G4733 Obstructive sleep apnea (adult) (pediatric): Secondary | ICD-10-CM | POA: Diagnosis not present

## 2024-03-22 DIAGNOSIS — M21372 Foot drop, left foot: Secondary | ICD-10-CM | POA: Diagnosis not present

## 2024-03-22 DIAGNOSIS — I5040 Unspecified combined systolic (congestive) and diastolic (congestive) heart failure: Secondary | ICD-10-CM | POA: Diagnosis not present

## 2024-03-22 DIAGNOSIS — F17210 Nicotine dependence, cigarettes, uncomplicated: Secondary | ICD-10-CM | POA: Diagnosis not present

## 2024-03-22 DIAGNOSIS — J9611 Chronic respiratory failure with hypoxia: Secondary | ICD-10-CM | POA: Diagnosis not present

## 2024-03-23 ENCOUNTER — Ambulatory Visit (HOSPITAL_COMMUNITY): Admitting: Physical Therapy

## 2024-03-25 ENCOUNTER — Ambulatory Visit (HOSPITAL_COMMUNITY)

## 2024-03-25 DIAGNOSIS — I5040 Unspecified combined systolic (congestive) and diastolic (congestive) heart failure: Secondary | ICD-10-CM | POA: Diagnosis not present

## 2024-03-25 DIAGNOSIS — E1122 Type 2 diabetes mellitus with diabetic chronic kidney disease: Secondary | ICD-10-CM | POA: Diagnosis not present

## 2024-03-25 DIAGNOSIS — I25119 Atherosclerotic heart disease of native coronary artery with unspecified angina pectoris: Secondary | ICD-10-CM | POA: Diagnosis not present

## 2024-03-25 DIAGNOSIS — F17210 Nicotine dependence, cigarettes, uncomplicated: Secondary | ICD-10-CM | POA: Diagnosis not present

## 2024-03-25 DIAGNOSIS — J449 Chronic obstructive pulmonary disease, unspecified: Secondary | ICD-10-CM | POA: Diagnosis not present

## 2024-03-25 DIAGNOSIS — M1990 Unspecified osteoarthritis, unspecified site: Secondary | ICD-10-CM | POA: Diagnosis not present

## 2024-03-25 DIAGNOSIS — M21372 Foot drop, left foot: Secondary | ICD-10-CM | POA: Diagnosis not present

## 2024-03-25 DIAGNOSIS — N1831 Chronic kidney disease, stage 3a: Secondary | ICD-10-CM | POA: Diagnosis not present

## 2024-03-25 DIAGNOSIS — F331 Major depressive disorder, recurrent, moderate: Secondary | ICD-10-CM | POA: Diagnosis not present

## 2024-03-25 DIAGNOSIS — I130 Hypertensive heart and chronic kidney disease with heart failure and stage 1 through stage 4 chronic kidney disease, or unspecified chronic kidney disease: Secondary | ICD-10-CM | POA: Diagnosis not present

## 2024-03-25 DIAGNOSIS — I69352 Hemiplegia and hemiparesis following cerebral infarction affecting left dominant side: Secondary | ICD-10-CM | POA: Diagnosis not present

## 2024-03-25 DIAGNOSIS — G4733 Obstructive sleep apnea (adult) (pediatric): Secondary | ICD-10-CM | POA: Diagnosis not present

## 2024-03-25 DIAGNOSIS — J9611 Chronic respiratory failure with hypoxia: Secondary | ICD-10-CM | POA: Diagnosis not present

## 2024-03-27 DIAGNOSIS — F331 Major depressive disorder, recurrent, moderate: Secondary | ICD-10-CM | POA: Diagnosis not present

## 2024-03-27 DIAGNOSIS — I2510 Atherosclerotic heart disease of native coronary artery without angina pectoris: Secondary | ICD-10-CM | POA: Diagnosis not present

## 2024-03-27 DIAGNOSIS — I5040 Unspecified combined systolic (congestive) and diastolic (congestive) heart failure: Secondary | ICD-10-CM | POA: Diagnosis not present

## 2024-03-27 DIAGNOSIS — L03116 Cellulitis of left lower limb: Secondary | ICD-10-CM | POA: Diagnosis not present

## 2024-03-27 DIAGNOSIS — I69352 Hemiplegia and hemiparesis following cerebral infarction affecting left dominant side: Secondary | ICD-10-CM | POA: Diagnosis not present

## 2024-03-27 DIAGNOSIS — I110 Hypertensive heart disease with heart failure: Secondary | ICD-10-CM | POA: Diagnosis not present

## 2024-03-27 DIAGNOSIS — J449 Chronic obstructive pulmonary disease, unspecified: Secondary | ICD-10-CM | POA: Diagnosis not present

## 2024-03-27 DIAGNOSIS — M21372 Foot drop, left foot: Secondary | ICD-10-CM | POA: Diagnosis not present

## 2024-03-27 DIAGNOSIS — E1122 Type 2 diabetes mellitus with diabetic chronic kidney disease: Secondary | ICD-10-CM | POA: Diagnosis not present

## 2024-03-27 DIAGNOSIS — I130 Hypertensive heart and chronic kidney disease with heart failure and stage 1 through stage 4 chronic kidney disease, or unspecified chronic kidney disease: Secondary | ICD-10-CM | POA: Diagnosis not present

## 2024-03-27 DIAGNOSIS — G4733 Obstructive sleep apnea (adult) (pediatric): Secondary | ICD-10-CM | POA: Diagnosis not present

## 2024-03-27 DIAGNOSIS — Z89429 Acquired absence of other toe(s), unspecified side: Secondary | ICD-10-CM | POA: Diagnosis not present

## 2024-03-27 DIAGNOSIS — I509 Heart failure, unspecified: Secondary | ICD-10-CM | POA: Diagnosis not present

## 2024-03-27 DIAGNOSIS — I25119 Atherosclerotic heart disease of native coronary artery with unspecified angina pectoris: Secondary | ICD-10-CM | POA: Diagnosis not present

## 2024-03-27 DIAGNOSIS — M7989 Other specified soft tissue disorders: Secondary | ICD-10-CM | POA: Diagnosis not present

## 2024-03-27 DIAGNOSIS — J9611 Chronic respiratory failure with hypoxia: Secondary | ICD-10-CM | POA: Diagnosis not present

## 2024-03-27 DIAGNOSIS — E785 Hyperlipidemia, unspecified: Secondary | ICD-10-CM | POA: Diagnosis not present

## 2024-03-27 DIAGNOSIS — M1990 Unspecified osteoarthritis, unspecified site: Secondary | ICD-10-CM | POA: Diagnosis not present

## 2024-03-27 DIAGNOSIS — I872 Venous insufficiency (chronic) (peripheral): Secondary | ICD-10-CM | POA: Diagnosis not present

## 2024-03-27 DIAGNOSIS — N1831 Chronic kidney disease, stage 3a: Secondary | ICD-10-CM | POA: Diagnosis not present

## 2024-03-27 DIAGNOSIS — M25572 Pain in left ankle and joints of left foot: Secondary | ICD-10-CM | POA: Diagnosis not present

## 2024-03-27 DIAGNOSIS — F17210 Nicotine dependence, cigarettes, uncomplicated: Secondary | ICD-10-CM | POA: Diagnosis not present

## 2024-03-27 DIAGNOSIS — L0390 Cellulitis, unspecified: Secondary | ICD-10-CM | POA: Diagnosis not present

## 2024-03-29 DIAGNOSIS — M21372 Foot drop, left foot: Secondary | ICD-10-CM | POA: Diagnosis not present

## 2024-03-29 DIAGNOSIS — I130 Hypertensive heart and chronic kidney disease with heart failure and stage 1 through stage 4 chronic kidney disease, or unspecified chronic kidney disease: Secondary | ICD-10-CM | POA: Diagnosis not present

## 2024-03-29 DIAGNOSIS — F331 Major depressive disorder, recurrent, moderate: Secondary | ICD-10-CM | POA: Diagnosis not present

## 2024-03-29 DIAGNOSIS — N1831 Chronic kidney disease, stage 3a: Secondary | ICD-10-CM | POA: Diagnosis not present

## 2024-03-29 DIAGNOSIS — M1990 Unspecified osteoarthritis, unspecified site: Secondary | ICD-10-CM | POA: Diagnosis not present

## 2024-03-29 DIAGNOSIS — I25119 Atherosclerotic heart disease of native coronary artery with unspecified angina pectoris: Secondary | ICD-10-CM | POA: Diagnosis not present

## 2024-03-29 DIAGNOSIS — F17210 Nicotine dependence, cigarettes, uncomplicated: Secondary | ICD-10-CM | POA: Diagnosis not present

## 2024-03-29 DIAGNOSIS — J449 Chronic obstructive pulmonary disease, unspecified: Secondary | ICD-10-CM | POA: Diagnosis not present

## 2024-03-29 DIAGNOSIS — I69352 Hemiplegia and hemiparesis following cerebral infarction affecting left dominant side: Secondary | ICD-10-CM | POA: Diagnosis not present

## 2024-03-29 DIAGNOSIS — J9611 Chronic respiratory failure with hypoxia: Secondary | ICD-10-CM | POA: Diagnosis not present

## 2024-03-29 DIAGNOSIS — G4733 Obstructive sleep apnea (adult) (pediatric): Secondary | ICD-10-CM | POA: Diagnosis not present

## 2024-03-29 DIAGNOSIS — I5040 Unspecified combined systolic (congestive) and diastolic (congestive) heart failure: Secondary | ICD-10-CM | POA: Diagnosis not present

## 2024-03-29 DIAGNOSIS — E1122 Type 2 diabetes mellitus with diabetic chronic kidney disease: Secondary | ICD-10-CM | POA: Diagnosis not present

## 2024-04-01 DIAGNOSIS — J9611 Chronic respiratory failure with hypoxia: Secondary | ICD-10-CM | POA: Diagnosis not present

## 2024-04-01 DIAGNOSIS — G4733 Obstructive sleep apnea (adult) (pediatric): Secondary | ICD-10-CM | POA: Diagnosis not present

## 2024-04-01 DIAGNOSIS — I69352 Hemiplegia and hemiparesis following cerebral infarction affecting left dominant side: Secondary | ICD-10-CM | POA: Diagnosis not present

## 2024-04-01 DIAGNOSIS — E1122 Type 2 diabetes mellitus with diabetic chronic kidney disease: Secondary | ICD-10-CM | POA: Diagnosis not present

## 2024-04-01 DIAGNOSIS — I5040 Unspecified combined systolic (congestive) and diastolic (congestive) heart failure: Secondary | ICD-10-CM | POA: Diagnosis not present

## 2024-04-01 DIAGNOSIS — J449 Chronic obstructive pulmonary disease, unspecified: Secondary | ICD-10-CM | POA: Diagnosis not present

## 2024-04-01 DIAGNOSIS — F17210 Nicotine dependence, cigarettes, uncomplicated: Secondary | ICD-10-CM | POA: Diagnosis not present

## 2024-04-01 DIAGNOSIS — I130 Hypertensive heart and chronic kidney disease with heart failure and stage 1 through stage 4 chronic kidney disease, or unspecified chronic kidney disease: Secondary | ICD-10-CM | POA: Diagnosis not present

## 2024-04-01 DIAGNOSIS — M21372 Foot drop, left foot: Secondary | ICD-10-CM | POA: Diagnosis not present

## 2024-04-01 DIAGNOSIS — I25119 Atherosclerotic heart disease of native coronary artery with unspecified angina pectoris: Secondary | ICD-10-CM | POA: Diagnosis not present

## 2024-04-01 DIAGNOSIS — F331 Major depressive disorder, recurrent, moderate: Secondary | ICD-10-CM | POA: Diagnosis not present

## 2024-04-01 DIAGNOSIS — N1831 Chronic kidney disease, stage 3a: Secondary | ICD-10-CM | POA: Diagnosis not present

## 2024-04-01 DIAGNOSIS — M1990 Unspecified osteoarthritis, unspecified site: Secondary | ICD-10-CM | POA: Diagnosis not present

## 2024-04-08 DIAGNOSIS — M1990 Unspecified osteoarthritis, unspecified site: Secondary | ICD-10-CM | POA: Diagnosis not present

## 2024-04-08 DIAGNOSIS — I69352 Hemiplegia and hemiparesis following cerebral infarction affecting left dominant side: Secondary | ICD-10-CM | POA: Diagnosis not present

## 2024-04-08 DIAGNOSIS — Z6829 Body mass index (BMI) 29.0-29.9, adult: Secondary | ICD-10-CM | POA: Diagnosis not present

## 2024-04-08 DIAGNOSIS — I130 Hypertensive heart and chronic kidney disease with heart failure and stage 1 through stage 4 chronic kidney disease, or unspecified chronic kidney disease: Secondary | ICD-10-CM | POA: Diagnosis not present

## 2024-04-08 DIAGNOSIS — I5040 Unspecified combined systolic (congestive) and diastolic (congestive) heart failure: Secondary | ICD-10-CM | POA: Diagnosis not present

## 2024-04-08 DIAGNOSIS — G4733 Obstructive sleep apnea (adult) (pediatric): Secondary | ICD-10-CM | POA: Diagnosis not present

## 2024-04-08 DIAGNOSIS — F331 Major depressive disorder, recurrent, moderate: Secondary | ICD-10-CM | POA: Diagnosis not present

## 2024-04-08 DIAGNOSIS — E1122 Type 2 diabetes mellitus with diabetic chronic kidney disease: Secondary | ICD-10-CM | POA: Diagnosis not present

## 2024-04-08 DIAGNOSIS — I25119 Atherosclerotic heart disease of native coronary artery with unspecified angina pectoris: Secondary | ICD-10-CM | POA: Diagnosis not present

## 2024-04-08 DIAGNOSIS — R300 Dysuria: Secondary | ICD-10-CM | POA: Diagnosis not present

## 2024-04-08 DIAGNOSIS — N1831 Chronic kidney disease, stage 3a: Secondary | ICD-10-CM | POA: Diagnosis not present

## 2024-04-08 DIAGNOSIS — J449 Chronic obstructive pulmonary disease, unspecified: Secondary | ICD-10-CM | POA: Diagnosis not present

## 2024-04-08 DIAGNOSIS — J9611 Chronic respiratory failure with hypoxia: Secondary | ICD-10-CM | POA: Diagnosis not present

## 2024-04-08 DIAGNOSIS — F17210 Nicotine dependence, cigarettes, uncomplicated: Secondary | ICD-10-CM | POA: Diagnosis not present

## 2024-04-08 DIAGNOSIS — M21372 Foot drop, left foot: Secondary | ICD-10-CM | POA: Diagnosis not present

## 2024-04-08 DIAGNOSIS — L0390 Cellulitis, unspecified: Secondary | ICD-10-CM | POA: Diagnosis not present

## 2024-04-20 ENCOUNTER — Encounter: Payer: Self-pay | Admitting: *Deleted

## 2024-04-20 ENCOUNTER — Encounter: Payer: Self-pay | Admitting: Cardiology

## 2024-04-20 ENCOUNTER — Ambulatory Visit: Admitting: Cardiology

## 2024-04-20 ENCOUNTER — Ambulatory Visit: Attending: Family Medicine | Admitting: Cardiology

## 2024-04-20 VITALS — BP 106/66 | HR 82 | Ht 71.0 in | Wt 213.0 lb

## 2024-04-20 DIAGNOSIS — I5020 Unspecified systolic (congestive) heart failure: Secondary | ICD-10-CM | POA: Insufficient documentation

## 2024-04-20 DIAGNOSIS — E782 Mixed hyperlipidemia: Secondary | ICD-10-CM | POA: Diagnosis not present

## 2024-04-20 DIAGNOSIS — I25119 Atherosclerotic heart disease of native coronary artery with unspecified angina pectoris: Secondary | ICD-10-CM | POA: Insufficient documentation

## 2024-04-20 NOTE — Patient Instructions (Addendum)

## 2024-04-20 NOTE — Progress Notes (Signed)
 Cardiology Office Note  Date: 04/20/2024   ID: MANSOOR HILLYARD, DOB October 22, 1965, MRN [id number]  History of Present Illness: BOBBYE REINITZ is a 58 y.o. male last seen in January.  He is here today for a follow-up visit.  Reports no chest pain, relatively sedentary in terms of activity level as before given prior stroke with residual.  He is in a wheelchair today.  He does have intermittent dependent edema predominantly involving his right foot, better when his legs are up in the evening.  He had a recent visit with PCP, we are requesting his lab work for review.  I went over his medications which are stable from a cardiac perspective.  He has not had to use any nitroglycerin .  He is currently finishing a course of antibiotics for lower extremity cellulitis and UTI.  I reviewed his ECG today which shows sinus rhythm with poor R wave progression, PAC, nonspecific ST-T changes.  Physical Exam: VS:  BP 106/66 (BP Location: Right Arm)   Pulse 82   Ht 5' 11 (1.803 m)   Wt 213 lb (96.6 kg)   SpO2 95%   BMI 29.71 kg/m , BMI Body mass index is 29.71 kg/m.  Wt Readings from Last 3 Encounters:  04/20/24 213 lb (96.6 kg)  03/18/24 213 lb (96.6 kg)  03/09/24 213 lb (96.6 kg)    General: Patient appears comfortable at rest. HEENT: Conjunctiva and lids normal. Neck: Supple, no elevated JVP or carotid bruits. Lungs: Clear to auscultation, nonlabored breathing at rest. Cardiac: Regular rate and rhythm, no S3, 1/6 systolic murmur. Extremities: Mild ankle edema, erythematous skin changes.  ECG:  An ECG dated 01/30/2023 was personally reviewed today and demonstrated:  Sinus rhythm with old anteroseptal infarct and nonspecific ST-T changes.  Labwork: 03/10/2024: ALT 18; AST 17; BUN 20; Creatinine, Ser 1.14; Hemoglobin 13.5; Magnesium 1.9; Platelets 170; Potassium 3.5; Sodium 142     Component Value Date/Time   CHOL 150 01/31/2023 1617   TRIG 115 01/31/2023 1617   HDL 30 (L)  01/31/2023 1617   CHOLHDL 5.0 01/31/2023 1617   VLDL 23 01/31/2023 1617   LDLCALC 97 01/31/2023 1617   LDLDIRECT 103 (H) 07/31/2022 1551   Other Studies Reviewed Today:  Echocardiogram 08/14/2023:  1. No evidence of LV thrombus. Definity  is administered. Left ventricular  ejection fraction, by estimation, is 40 to 45%. The left ventricle has  mildly decreased function. The left ventricle demonstrates regional wall  motion abnormalities (see scoring  diagram/findings for description). Left ventricular diastolic parameters  are consistent with Grade I diastolic dysfunction (impaired relaxation).   2. Right ventricular systolic function is normal. The right ventricular  size is mildly enlarged. Tricuspid regurgitation signal is inadequate for  assessing PA pressure.   3. Right atrial size was moderately dilated.   4. The mitral valve is normal in structure. No evidence of mitral valve  regurgitation. No evidence of mitral stenosis.   5. The aortic valve is tricuspid. Aortic valve regurgitation is mild. No  aortic stenosis is present.   Assessment and Plan:  1.  Multivessel CAD status post CABG in 2010 including LIMA to LAD, SVG to diagonal, and SVG to OM1 and OM 2.  No angina at this time on medical therapy.  Continue Plavix  75 mg daily, Lipitor  80 mg daily, Zetia  10 mg daily, Imdur  30 mg daily, and as needed nitroglycerin .   2.  HFmrEF with ischemic cardiomyopathy, follow-up echocardiogram in January showed stable LVEF in the  range of 40 to 45%.  GDMT what limited by low blood pressure/orthostasis.  He is on midodrine  10 mg twice daily with stable blood pressure today.  Otherwise Coreg  6.25 mg twice daily, Lasix  20 mg daily, Entresto  49/51 mg twice daily, and Ozempic 4 mg weekly.   3.  Mixed hyperlipidemia.  Requesting interval lab work from PCP.  Currently on Lipitor  80 mg daily and Zetia  10 mg daily.   4.  History of stroke with left-sided residua.  Disposition:  Follow up 6  months.  Signed, Jayson JUDITHANN Sierras, M.D., F.A.C.C. [facility] at [facility]

## 2024-04-21 ENCOUNTER — Encounter: Payer: Self-pay | Admitting: Family Medicine

## 2024-04-29 ENCOUNTER — Encounter: Attending: Physical Medicine & Rehabilitation | Admitting: Physical Medicine & Rehabilitation

## 2024-04-29 ENCOUNTER — Other Ambulatory Visit: Payer: Self-pay | Admitting: Neurology

## 2024-04-29 ENCOUNTER — Encounter: Payer: Self-pay | Admitting: Physical Medicine & Rehabilitation

## 2024-04-29 VITALS — BP 111/70 | HR 88 | Ht 71.0 in | Wt 213.0 lb

## 2024-04-29 DIAGNOSIS — M25512 Pain in left shoulder: Secondary | ICD-10-CM | POA: Diagnosis not present

## 2024-04-29 DIAGNOSIS — G8929 Other chronic pain: Secondary | ICD-10-CM | POA: Diagnosis not present

## 2024-04-29 MED ORDER — LIDOCAINE HCL 1 % IJ SOLN
5.0000 mL | Freq: Once | INTRAMUSCULAR | Status: AC
  Administered 2024-04-29: 5 mL

## 2024-04-29 MED ORDER — BUPIVACAINE HCL (PF) 0.5 % IJ SOLN
5.0000 mL | Freq: Once | INTRAMUSCULAR | Status: AC
  Administered 2024-04-29: 5 mL

## 2024-04-29 MED ORDER — BETAMETHASONE SOD PHOS & ACET 6 (3-3) MG/ML IJ SUSP
6.0000 mg | Freq: Once | INTRAMUSCULAR | Status: AC
  Administered 2024-04-29: 6 mg

## 2024-04-29 NOTE — Progress Notes (Signed)
 LEFT SUPRASCAPULAR NERVE BLOCK Indication chronic shoulder pain that is not responsive to medication management and other conservative care  Pain is severe and interferes with activities  Informed consent was obtained after describing risks and benefits of the procedure including bleeding bruising and infection. She elects to proceed and has given written consent. Patient placed in a seated position medial to lateral approach utilized. Linear transducer placed in oblique coronal plane. Suprascapular notch identified. Needle inserted in plane medial to lateral. Once target was reached with needle tip, a solution containing one ML 6 ML per cc betamethasone  and 4 mL 0.5% Marcaine  were injected. Patient tolerated procedure well. Post procedure instructions given   Patient is starting to have occipital headaches again last injection performed in July 2025.  Will schedule repeat right occipital nerve injection.

## 2024-04-29 NOTE — Patient Instructions (Signed)
 You received a suprascapular nerve block today.  This is to help with shoulder pain on the left side.  We did an under ultrasound guidance to increase accuracy of needle placement.  Please call if any evidence of redness or swelling in the area.

## 2024-04-30 ENCOUNTER — Encounter: Payer: Self-pay | Admitting: Physical Medicine & Rehabilitation

## 2024-05-03 NOTE — Progress Notes (Unsigned)
 NEUROLOGY FOLLOW UP OFFICE NOTE  [name] [id number]  Assessment/Plan:   Recurrent episodes of left sided facial numbness and speech difficulty.  Unclear etiology but likely intermittent chronic residual stroke symptoms.  Recurrent habitual spells unlikely to be TIA.  Recurrent habitual spells such as migraine and seizure are felt to be unlikely.  Polyneuropathy, likely secondary to diabetes vs idiopathic Postural Tremor.  He does have history of orthostasis and tremor occurs when standing.  Left side likely not involved due to spastic hemiplegia. 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  He has failed Cymbalta , gabapentin and pregablin.  I would recommend pain specialist. Repeat MRA of head as 6 month follow up of possible cerebral aneurysms vs infundibula Follow up with PM&R for occipital nerve block Secondary stroke prevention as managed by PCP/cardiology:  - ASA and Plavix    - Statin/Zetia .  LDL goal less than 70  - Hgb A1c goal less than 7  - Normotensive blood pressure 4.  Follow up 7 months.   Subjective:  [name] is a 58 year old right-handed man with hypertension, type 2 diabetes mellitus, CAD, ischemic cardiomyopathy, COPD, tobacco use disorder, depression, prostate cancer and history of prior strokes who follows up for migraines.  History supplemented by his accompanying niece.     UPDATE: Current medications:  ASA 81mg , Plavix  75mg , atorvastatin  80mg , Zetia , midodrine , Coreg , Imdur ,  Lasix , alprazolam  1mg  PRN, Ozempic, oxycodone , naloxone, baclofen , Cymbalta  60mg  twice daily, Lyrica  200mg  BID, baclofen   To evaluate for a focal stenosis causing episodic left facial numbness, he had MRA of head on 11/01/2023  which revealed no high-grade stenosis or occlusion but did reveal 2 mm outpouching at cavernous segment of left ICA and 3 mm outpouching at distal cavernous segment of right ICA suggestive of small aneurysms vs infundibula.  Since then, he has had two other episodes of transient left sided facial numbness and speech difficulty, lasting 15 minutes. No associated headache.   Increased Lyrica  to 200mg  twice daily to treat neuropathic pain.  Reports no improvement in pain in his heels and legs.    Still having occipital neuralgia.  Scheduled for occipital nerve block next month.     HISTORY: He was admitted to [facility] on 06/14/17 with sudden onset left sided weakness and numbness and difficulty speaking.  He initially presented to [facility] where he received tPA with NIHSS of 15 and was then transferred to [facility].    CT of head from [facility] reportedly showed "acute infarction involving portions of the posterior right basal ganglia, right corona radiata and body of the right caudate nucleus".  MRI of brain at [facility] confirmed acute right basal ganglia infarct as well as remote right frontal infarct.  MRA of head and carotid doppler revealed no significant intracranial or extracranial arterial stenosis or occlusion.  He was unable to have CTA due to allergy to iodinated contrast.    Echocardiogram with bubble study showed EF of 25-30% with no evidence of PFO or ASD.  He was evaluated by cardiology for ischemic cardiomyopathy.  Lexiscan nuclear perfusion study revealed EF 18% with large infarct involving the apex and periapical anterior wall with no evidence of ischemia.  He was advised to follow up with outpatient cardiology.  LDL was 123 and triglycerides were 237.  Hgb A1c was 5.7.  He was already taking ASA 325mg  but not daily, as well as Plavix .  He is continued on ASA and Plavix .  06/30/2019 Echocardiogram:  LV EF 40-50%   He was seen in the ED at [facility]  on 09/22/17 for headache.  CT of head showed no acute findings but was read as demonstrating a chronic appearing infarct in the right posterior limb of internal capsule that was not present on prior CT from 06/14/17.  He also complained of left leg pain.  He reportedly had an elevated d dimer.  CT chest and venous doppler of lower extremity were negative for DVT.  However, the started him on Xarelto.  He was told to stop Plavix  by his PCP.  I contacted his PCP, Dr. Toribio, who reviewed the ED notes and is also unsure why they started Xarelto when imaging was negative for DVT.  He was advised to discontinue Xarelto and restart Plavix  with aspirin .   Headaches returned in December 2020.  Right sided 10/10 throbbing/pressure pain in the temple radiating to back of head.  Associated nausea, blurred vision, photophobia.  They last 2 hours and occur 2 to 3 days a week.  He reports right occipital/suboccipital numbness and tingling off and on.  He also notes that his right hand twitches about every other day, lasting 20-30 minutes.  It may occur while holding a cup but also at rest.  Due to worsening headache, MRI of brain without contrast was performed on 09/13/2019, which was personally reviewed, and demonstrated stable advanced chronic small vessel ischemic changes but no acute intracranial abnormality. Due to right hand myoclonus, EEG was performed on 08/17/2019, which was normal.  MRI of cervical spine on 08/08/2020 showed mild cervical spondylosis with mild spinal stenosis and mild to moderate neural foraminal stenosis at C3-4 and C4-5.  In April 2023, he was hospitalized at [facility] for altered mental status and acute respiratory failure.  Unsure if he was having a stroke or possibly status epilepticus.  He had significant hypotension.  Unable to get an MRI due to body habitus.  Serial head CTs revealed remote right hemispheric encephalomalacia but no acute findings.  CTA head and neck revealed atherosclerosis in both  carotid bifurcations and flow lmiting stenosis of the left ICA at the distal bulb but on LVO or significant intracranial stenosis.  Long term video EEG monitoring revealed generalized slowing but no epileptiform discharges or electrographic seizures.   In October 2024, he endorsed a couple of episodes of left sided facial numbness.  He also noted occasional palpitations.  MRI of brain without contrast on 04/18/2023 revealed stable extensive chronic small vessel ischemic changes with old infarcts in the right thalamus, posterior basal ganglia and radiating white matter tracts as well as old right posterior frontal cortical and subcortical infarcts and smaller chronic infarcts in the left thalamus and basal ganglia but no new or acute findings.  Cardiac event monitor was ordered but never performed.  He has since followed up with his cardiologist.  Echo on 08/14/2023 showed EF 40-45%.  He reports that he still has episodes of left facial numbness 2-3 times in the past 6 months.  Still feels palpitations.  More recently, he notes that his right arm and leg feels weak and shaky.  It occurs mainly when he has to work.  It occurs twice a week for the past couple of months.     He had neuropsychological testing performed on 03/31/18, which demonstrated vascular cognitive impairment affecting psychomotor  processing speed, attention/working memory, and executive functioning.  He did go to PT and speech therapy.     He sees Dr. Carilyn for Dysport  injection to treat spasticity.  He reports panic attacks.  He sees Dr. Vincente for depression and anxiety.   Past medications:  gabapentin 600mg  BID    PAST MEDICAL HISTORY: Past Medical History:  Diagnosis Date   Anaphylaxis    IGE mediated   Anxiety    Arthritis    Cancer (HCC)    Prostate   Cardiomyopathy (HCC)    CHF (congestive heart failure) (HCC)    COPD (chronic obstructive pulmonary disease) (HCC)    Coronary atherosclerosis of native coronary artery     Multivessel status post CABG 2010   Depression    Essential hypertension    History of CVA (cerebrovascular accident) 01/2012   Right posterior frontal cortical and subcortical brain by MRI, no hemorrhage. Carotid Dopplers showed only 1-50% bilateral ICA stenoses. Echocardiogram showed LVEF 50%, no major valvular abnormalities.   History of kidney stones    Mixed hyperlipidemia    Myocardial infarction (HCC) 2010   OSA (obstructive sleep apnea)    Pneumonia    Prostate cancer (HCC) 12/2020   Dr. Alvaro at [facility]   Stroke [facility])    Type 2 diabetes mellitus [facility])     MEDICATIONS: Current Outpatient Medications on File Prior to Visit  Medication Sig Dispense Refill   acetaminophen  (TYLENOL ) 500 MG tablet Take 1,000 mg by mouth every 6 (six) hours as needed for moderate pain or headache.     albuterol  (PROVENTIL  HFA;VENTOLIN  HFA) 108 (90 BASE) MCG/ACT inhaler Inhale 2 puffs into the lungs every 6 (six) hours as needed for wheezing or shortness of breath.     ALPRAZolam  (XANAX ) 1 MG tablet Take 1 mg by mouth 2 (two) times daily as needed for anxiety or sleep.     atorvastatin  (LIPITOR ) 80 MG tablet Take 1 tablet (80 mg total) by mouth daily. 90 tablet 3   baclofen  (LIORESAL ) 20 MG tablet Take 20 mg by mouth 2 (two) times daily.     carvedilol  (COREG ) 6.25 MG tablet take 1 tablet (6.25 MILLIGRAM total) by mouth 2 (two) times daily. 180 tablet 1   cephALEXin  (KEFLEX ) 500 MG capsule Take 500 mg by mouth 3 (three) times daily.     Cholecalciferol (VITAMIN D) 50 MCG (2000 UT) CAPS Take 1 capsule by mouth daily.     clopidogrel  (PLAVIX ) 75 MG tablet Take 75 mg by mouth daily.     COSENTYX UNOREADY 300 MG/2ML SOAJ Inject 300 mg into the skin every 28 (twenty-eight) days.     dapsone  25 MG tablet Take 25 mg by mouth 2 (two) times daily.     DULoxetine  (CYMBALTA ) 60 MG capsule Take 1 capsule (60 mg total) by mouth daily. (Patient not taking: Reported on 04/29/2024) 90 capsule 0   ENTRESTO   49-51 MG Take 1 tablet by mouth 2 (two) times daily.     ezetimibe  (ZETIA ) 10 MG tablet take 1 tablet (10 MILLIGRAM total) by mouth daily. 90 tablet 3   furosemide  (LASIX ) 20 MG tablet Take 20 mg by mouth daily.     isosorbide  mononitrate (IMDUR ) 30 MG 24 hr tablet Take 30 mg by mouth daily.     midodrine  (PROAMATINE ) 10 MG tablet Take 10 mg by mouth 2 (two) times daily.     naloxone (NARCAN) nasal spray 4 mg/0.1 mL Place 1 spray into the nose as needed (  opioid overdose).     nitroGLYCERIN  (NITROSTAT ) 0.4 MG SL tablet Place 0.4 mg under the tongue every 5 (five) minutes as needed for chest pain.     ondansetron  (ZOFRAN ) 8 MG tablet Take 8 mg by mouth every 8 (eight) hours as needed for vomiting or nausea.     oxyCODONE -acetaminophen  (PERCOCET) 10-325 MG tablet Take 1 tablet by mouth every 8 (eight) hours as needed for pain.     OZEMPIC, 1 MG/DOSE, 4 MG/3ML SOPN Inject 1 mg into the skin every Monday.     pregabalin  (LYRICA ) 200 MG capsule Take 1 capsule (200 mg total) by mouth 2 (two) times daily. 60 capsule 5   triamcinolone  cream (KENALOG ) 0.1 % Apply 1 Application topically 2 (two) times daily.     Current Facility-Administered Medications on File Prior to Visit  Medication Dose Route Frequency Provider Last Rate Last Admin   betamethasone  acetate-betamethasone  sodium phosphate  (CELESTONE ) injection 12 mg  12 mg Intramuscular Once Kirsteins, Prentice BRAVO, MD       lidocaine  (XYLOCAINE ) 1 % (with pres) injection 4 mL  4 mL Other Once Kirsteins, Prentice BRAVO, MD       sodium chloride  (PF) 0.9 % injection 2.5 mL  2.5 mL Other Once         ALLERGIES: Allergies  Allergen Reactions   Ibuprofen Anaphylaxis, Hives and Swelling   Iodinated Contrast Media Anaphylaxis, Shortness Of Breath, Itching, Swelling and Rash    Isovue contrast is most acceptable agent based on previous experience and testing with premedications Throat swells up   Nsaids Anaphylaxis, Hives, Swelling, Dermatitis and Rash    Can  take Aspirin  325 mg or lower   Other Anaphylaxis and Other (See Comments)    Anti-psychotics Personality change   Ace Inhibitors Other (See Comments)    Unknown    Chantix [Varenicline] Other (See Comments)    Nightmares    Pollen Extract Other (See Comments)    Unknown     FAMILY HISTORY: Family History  Problem Relation Age of Onset   Lung cancer Father    Heart disease Father    Heart disease Mother    Congestive Heart Failure Mother    Diabetes Mother    Hyperlipidemia Mother    Alcohol  abuse Brother    Breast cancer Maternal Aunt    Suicidality Cousin       Objective:  Blood pressure 113/69, pulse 76, height 5' 11 (1.803 m), weight 213 lb (96.6 kg), SpO2 95%. General: No acute distress.  Patient appears well-groomed.   Head:  Normocephalic/atraumatic Neck:  Supple.  No paraspinal tenderness.  Full range of motion. Heart:  Regular rate and rhythm. Neuro:  Alert and oriented.  Speech fluent and not dysarthric.  Language intact.  Decreased left V2.  Otherwise, CN II-XII intact.  Tone flaccid in left upper extremity except increased in elbow, plegic except 2+/5 hand grip, 2+/5 left hip flexion, plegic left knee flexion/extension, left foot drop, 5/5 right upper and lower extremities.  Sensation to light touch reduced in left upper and lower extremities.  DTR 3+ left upper and lower extremities, 2+ right upper and lower extremities.  Finger to nose intact on right, unable to assess left.     Juliene Dunnings, DO  CC: Jerel Sieving, MD

## 2024-05-04 ENCOUNTER — Ambulatory Visit (INDEPENDENT_AMBULATORY_CARE_PROVIDER_SITE_OTHER): Admitting: Neurology

## 2024-05-04 ENCOUNTER — Encounter: Payer: Self-pay | Admitting: Neurology

## 2024-05-04 VITALS — BP 113/69 | HR 76 | Ht 71.0 in | Wt 213.0 lb

## 2024-05-04 DIAGNOSIS — I671 Cerebral aneurysm, nonruptured: Secondary | ICD-10-CM

## 2024-05-04 DIAGNOSIS — M5481 Occipital neuralgia: Secondary | ICD-10-CM

## 2024-05-04 DIAGNOSIS — G629 Polyneuropathy, unspecified: Secondary | ICD-10-CM | POA: Diagnosis not present

## 2024-05-04 DIAGNOSIS — I69354 Hemiplegia and hemiparesis following cerebral infarction affecting left non-dominant side: Secondary | ICD-10-CM | POA: Diagnosis not present

## 2024-05-04 NOTE — Patient Instructions (Addendum)
 Decrease pregablin to 1 pill daily for one week, then stop Check MRA of head Follow up 7 months.

## 2024-05-10 NOTE — Progress Notes (Addendum)
 PA Approved 05/10/24-08/10/24 for MRA head.  AUTH# [id number]

## 2024-05-10 NOTE — Progress Notes (Signed)
 Tried calling  [employer] [phone] to start PA for MRA Head W/O, Per Rep please go to the [place] website to print off the PA form and fax it back. [phone]. It may take up to 7-10 business days to response.    Forms filled out and faxed over with Clinical notes.   Patient has MDCD as well.

## 2024-05-11 ENCOUNTER — Other Ambulatory Visit: Payer: Self-pay | Admitting: Neurology

## 2024-05-17 ENCOUNTER — Ambulatory Visit (HOSPITAL_COMMUNITY): Admission: RE | Admit: 2024-05-17 | Source: Ambulatory Visit

## 2024-05-24 ENCOUNTER — Ambulatory Visit (HOSPITAL_COMMUNITY)
Admission: RE | Admit: 2024-05-24 | Discharge: 2024-05-24 | Disposition: A | Discharge status: Home / Self Care | Source: Ambulatory Visit | Attending: Neurology | Admitting: Neurology

## 2024-05-24 DIAGNOSIS — I671 Cerebral aneurysm, nonruptured: Secondary | ICD-10-CM | POA: Diagnosis present

## 2024-05-28 ENCOUNTER — Ambulatory Visit: Payer: Self-pay | Admitting: Neurology

## 2024-05-31 ENCOUNTER — Telehealth: Payer: Self-pay | Admitting: Neurology

## 2024-05-31 NOTE — Progress Notes (Signed)
 LMOVM for patient to call the office back.

## 2024-05-31 NOTE — Telephone Encounter (Signed)
 Pt is returning call about the CT Scan. Thanks

## 2024-06-02 NOTE — Progress Notes (Signed)
 Patient advised.

## 2024-06-02 NOTE — Telephone Encounter (Signed)
 See results notes.

## 2024-06-03 ENCOUNTER — Encounter: Payer: Self-pay | Admitting: Neurology

## 2024-06-03 ENCOUNTER — Encounter: Attending: Physical Medicine & Rehabilitation | Admitting: Physical Medicine & Rehabilitation

## 2024-06-03 ENCOUNTER — Encounter: Payer: Self-pay | Admitting: Physical Medicine & Rehabilitation

## 2024-06-03 VITALS — BP 111/70 | HR 80 | Ht 71.0 in

## 2024-06-03 DIAGNOSIS — G8929 Other chronic pain: Secondary | ICD-10-CM | POA: Insufficient documentation

## 2024-06-03 DIAGNOSIS — M25512 Pain in left shoulder: Secondary | ICD-10-CM | POA: Diagnosis present

## 2024-06-03 MED ORDER — LIDOCAINE HCL 1 % IJ SOLN
5.0000 mL | Freq: Once | INTRAMUSCULAR | Status: AC
  Administered 2024-06-03: 5 mL

## 2024-06-03 MED ORDER — BETAMETHASONE SOD PHOS & ACET 6 (3-3) MG/ML IJ SUSP
12.0000 mg | Freq: Once | INTRAMUSCULAR | Status: AC
  Administered 2024-06-03: 12 mg

## 2024-06-03 NOTE — Patient Instructions (Signed)
 Occipital Nerve Block Patient Information  Description: The occipital nerves originate in the cervical (neck) spinal cord and travel upward through muscle and tissue to supply sensation to the back of the head and top of the scalp.  In addition, the nerves control some of the muscles of the scalp.  Occipital neuralgia is an irritation of these nerves which can cause headaches, numbness of the scalp, and neck discomfort.     The occipital nerve block will interrupt nerve transmission through these nerves and can relieve pain and spasm.  The block consists of insertion of a small needle under the skin in the back of the head to deposit local anesthetic (numbing medicine) and/or steroids around the nerve.  The entire block usually lasts less than 5 minutes.  Conditions which may be treated by occipital blocks:  Muscular pain and spasm of the scalp Nerve irritation, back of the head Headaches Upper neck pain   Possible side-effects:  Bleeding from needle site Infection (rare, may require surgery) Nerve injury (rare) Hair on back of neck can be tinged with iodine  scrub (this will wash out) Light-headedness (temporary) Pain at injection site (several days) Decreased blood pressure (rare, temporary) Seizure (very rare)  Call if you experience:  Hives or difficulty breathing ( go to the emergency room) Inflammation or drainage at the injection site(s)  Please note:  Although the local anesthetic injected can often make your painful muscles or headache feel good for several hours after the injection, the pain may return.  It takes 3-7 days for steroids to work.  You may not notice any pain relief for at least one week.  If effective, we will often do a series of injections spaced 3-6 weeks apart to maximally decrease your pain.  If you have any questions, please call [phone] [facility]

## 2024-06-03 NOTE — Progress Notes (Signed)
 Right  occipital nerve block Indication right occipital neuralgia Pain is only partially response to medication management of the conservative care Occipital protuberance palpated prepped with Betadine  and entered with a 30-gauge 1/2 inch needle, 1/2 ml of 1% lidocaine  injected.Then with a 25 gauge 1.5 inch needle a solution of 1cc of celestone  6 mg per cc +2 cc of 1% lidocaine  injected in a fan-like pattern. Patient tolerated procedure well

## 2024-07-06 ENCOUNTER — Encounter: Payer: Self-pay | Admitting: Physical Medicine & Rehabilitation

## 2024-07-06 ENCOUNTER — Encounter: Attending: Physical Medicine & Rehabilitation | Admitting: Physical Medicine & Rehabilitation

## 2024-07-06 VITALS — BP 102/61 | HR 77 | Ht 71.0 in | Wt 210.0 lb

## 2024-07-06 DIAGNOSIS — G8110 Spastic hemiplegia affecting unspecified side: Secondary | ICD-10-CM | POA: Diagnosis not present

## 2024-07-06 DIAGNOSIS — G8114 Spastic hemiplegia affecting left nondominant side: Secondary | ICD-10-CM | POA: Insufficient documentation

## 2024-07-06 DIAGNOSIS — L03116 Cellulitis of left lower limb: Secondary | ICD-10-CM | POA: Insufficient documentation

## 2024-07-06 MED ORDER — ABOBOTULINUMTOXINA 500 UNITS IM SOLR
500.0000 [IU] | Freq: Once | INTRAMUSCULAR | Status: AC
  Administered 2024-07-06: 500 [IU] via INTRAMUSCULAR

## 2024-07-06 MED ORDER — SODIUM CHLORIDE (PF) 0.9 % IJ SOLN
3.0000 mL | Freq: Once | INTRAMUSCULAR | Status: AC
  Administered 2024-07-06: 3 mL

## 2024-07-06 NOTE — Progress Notes (Signed)
 Dysport  Injection for spasticity using needle EMG guidance  Dilution: 200 Units/ml Indication: Severe spasticity which interferes with ADL,mobility and/or  hygiene and is unresponsive to medication management and other conservative care Informed consent was obtained after describing risks and benefits of the procedure with the patient. This includes bleeding, bruising, infection, excessive weakness, or medication side effects. A REMS form is on file and signed. Pt on Abx for LLE cellulitis, all injections done proximal to erythematous area on lower leg Needle:  needle electrode Number of units per muscle LUE- 300U total FDS 100-   MAS 0 FDP 100-   MAS 0  FCR 100   MAS 0     LLE 600U total   200U Semitendinosis MAS 3   Tibialis post  200U    med gastroc 100U Lat gastroc 100U MAS 3 All injections were done after obtaining appropriate EMG activity and after negative drawback for blood. The patient tolerated the procedure well. Post procedure instructions were given. A followup appointment was made.

## 2024-07-06 NOTE — Patient Instructions (Signed)
You received a Dysport injection today. You may experience muscle pains and aches. He may apply ice 20 minutes every 2 hours as needed for the next 24-48 hours. He also noticed bleeding or bruising in the areas that were injected. May apply Band-Aid. If this bruising is extensive, please notify our office. If there is evidence of increasing redness that occurs 2-3 days after injection. Please call our office. This could be a sign of infection. It is very rare, however. You may experience some muscle weakness in the muscles and injected. This would likely start in about one week.  

## 2024-10-05 ENCOUNTER — Encounter: Admitting: Physical Medicine & Rehabilitation

## 2024-10-06 ENCOUNTER — Ambulatory Visit: Admitting: Cardiology

## 2024-11-29 ENCOUNTER — Ambulatory Visit: Admitting: Neurology

## 2024-12-07 ENCOUNTER — Ambulatory Visit: Admitting: Neurology
